# Patient Record
Sex: Female | Born: 1970 | Hispanic: No | Marital: Married
Health system: Midwestern US, Community
[De-identification: ages and names within clinical notes are randomized; demographics above are authoritative.]

## PROBLEM LIST (undated history)

## (undated) DIAGNOSIS — Z9289 Personal history of other medical treatment: Secondary | ICD-10-CM

## (undated) DIAGNOSIS — F319 Bipolar disorder, unspecified: Secondary | ICD-10-CM

## (undated) DIAGNOSIS — J45909 Unspecified asthma, uncomplicated: Secondary | ICD-10-CM

## (undated) DIAGNOSIS — K649 Unspecified hemorrhoids: Secondary | ICD-10-CM

## (undated) DIAGNOSIS — E119 Type 2 diabetes mellitus without complications: Secondary | ICD-10-CM

## (undated) DIAGNOSIS — F209 Schizophrenia, unspecified: Secondary | ICD-10-CM

## (undated) DIAGNOSIS — M1990 Unspecified osteoarthritis, unspecified site: Secondary | ICD-10-CM

## (undated) DIAGNOSIS — F329 Major depressive disorder, single episode, unspecified: Secondary | ICD-10-CM

## (undated) DIAGNOSIS — J40 Bronchitis, not specified as acute or chronic: Secondary | ICD-10-CM

## (undated) DIAGNOSIS — F259 Schizoaffective disorder, unspecified: Secondary | ICD-10-CM

## (undated) DIAGNOSIS — F32A Depression, unspecified: Secondary | ICD-10-CM

## (undated) HISTORY — DX: Unspecified asthma, uncomplicated: J45.909

## (undated) HISTORY — PX: OTHER SURGICAL HISTORY: SHX169

## (undated) HISTORY — PX: COLONOSCOPY: SHX174

---

## 1997-09-08 ENCOUNTER — Inpatient Hospital Stay (HOSPITAL_COMMUNITY): Admission: AD | Admit: 1997-09-08 | Discharge: 1997-09-08 | Payer: Self-pay | Admitting: Obstetrics

## 1997-09-22 ENCOUNTER — Inpatient Hospital Stay (HOSPITAL_COMMUNITY): Admission: AD | Admit: 1997-09-22 | Discharge: 1997-09-22 | Payer: Self-pay | Admitting: Obstetrics

## 1997-09-27 ENCOUNTER — Emergency Department (HOSPITAL_COMMUNITY): Admission: EM | Admit: 1997-09-27 | Discharge: 1997-09-27 | Payer: Self-pay | Admitting: Emergency Medicine

## 1997-12-16 ENCOUNTER — Ambulatory Visit (HOSPITAL_COMMUNITY): Admission: RE | Admit: 1997-12-16 | Discharge: 1997-12-16 | Payer: Self-pay | Admitting: *Deleted

## 1998-01-20 ENCOUNTER — Ambulatory Visit (HOSPITAL_COMMUNITY): Admission: RE | Admit: 1998-01-20 | Discharge: 1998-01-20 | Payer: Self-pay | Admitting: *Deleted

## 1998-03-03 ENCOUNTER — Inpatient Hospital Stay (HOSPITAL_COMMUNITY): Admission: AD | Admit: 1998-03-03 | Discharge: 1998-03-03 | Payer: Self-pay | Admitting: *Deleted

## 1998-03-09 ENCOUNTER — Inpatient Hospital Stay (HOSPITAL_COMMUNITY): Admission: AD | Admit: 1998-03-09 | Discharge: 1998-03-09 | Payer: Self-pay | Admitting: *Deleted

## 1998-04-05 ENCOUNTER — Inpatient Hospital Stay (HOSPITAL_COMMUNITY): Admission: AD | Admit: 1998-04-05 | Discharge: 1998-04-05 | Payer: Self-pay | Admitting: *Deleted

## 1998-04-19 ENCOUNTER — Inpatient Hospital Stay (HOSPITAL_COMMUNITY): Admission: AD | Admit: 1998-04-19 | Discharge: 1998-04-19 | Payer: Self-pay | Admitting: *Deleted

## 1998-05-08 ENCOUNTER — Inpatient Hospital Stay (HOSPITAL_COMMUNITY): Admission: AD | Admit: 1998-05-08 | Discharge: 1998-05-11 | Payer: Self-pay | Admitting: *Deleted

## 1998-05-16 ENCOUNTER — Inpatient Hospital Stay (HOSPITAL_COMMUNITY): Admission: AD | Admit: 1998-05-16 | Discharge: 1998-05-16 | Payer: Self-pay | Admitting: *Deleted

## 1998-06-09 ENCOUNTER — Emergency Department (HOSPITAL_COMMUNITY): Admission: EM | Admit: 1998-06-09 | Discharge: 1998-06-09 | Payer: Self-pay | Admitting: Emergency Medicine

## 1998-09-18 ENCOUNTER — Inpatient Hospital Stay (HOSPITAL_COMMUNITY): Admission: AD | Admit: 1998-09-18 | Discharge: 1998-09-18 | Payer: Self-pay | Admitting: *Deleted

## 1999-02-20 ENCOUNTER — Ambulatory Visit (HOSPITAL_COMMUNITY): Admission: RE | Admit: 1999-02-20 | Discharge: 1999-02-20 | Payer: Self-pay | Admitting: *Deleted

## 1999-10-29 ENCOUNTER — Emergency Department (HOSPITAL_COMMUNITY): Admission: EM | Admit: 1999-10-29 | Discharge: 1999-10-29 | Payer: Self-pay | Admitting: Emergency Medicine

## 1999-11-10 ENCOUNTER — Inpatient Hospital Stay (HOSPITAL_COMMUNITY): Admission: AD | Admit: 1999-11-10 | Discharge: 1999-11-10 | Payer: Self-pay | Admitting: *Deleted

## 2000-05-23 ENCOUNTER — Emergency Department (HOSPITAL_COMMUNITY): Admission: EM | Admit: 2000-05-23 | Discharge: 2000-05-23 | Payer: Self-pay | Admitting: Emergency Medicine

## 2000-05-29 ENCOUNTER — Inpatient Hospital Stay (HOSPITAL_COMMUNITY): Admission: AD | Admit: 2000-05-29 | Discharge: 2000-05-29 | Payer: Self-pay | Admitting: *Deleted

## 2000-07-06 ENCOUNTER — Inpatient Hospital Stay (HOSPITAL_COMMUNITY): Admission: AD | Admit: 2000-07-06 | Discharge: 2000-07-06 | Payer: Self-pay | Admitting: Obstetrics

## 2000-11-13 ENCOUNTER — Emergency Department (HOSPITAL_COMMUNITY): Admission: EM | Admit: 2000-11-13 | Discharge: 2000-11-14 | Payer: Self-pay | Admitting: Emergency Medicine

## 2001-01-25 ENCOUNTER — Inpatient Hospital Stay (HOSPITAL_COMMUNITY): Admission: AD | Admit: 2001-01-25 | Discharge: 2001-01-25 | Payer: Self-pay | Admitting: Obstetrics

## 2001-01-29 ENCOUNTER — Encounter: Payer: Self-pay | Admitting: Obstetrics

## 2001-01-29 ENCOUNTER — Inpatient Hospital Stay (HOSPITAL_COMMUNITY): Admission: AD | Admit: 2001-01-29 | Discharge: 2001-01-29 | Payer: Self-pay | Admitting: Obstetrics

## 2001-02-01 ENCOUNTER — Inpatient Hospital Stay (HOSPITAL_COMMUNITY): Admission: AD | Admit: 2001-02-01 | Discharge: 2001-02-01 | Payer: Self-pay | Admitting: Obstetrics

## 2001-02-14 ENCOUNTER — Encounter: Admission: RE | Admit: 2001-02-14 | Discharge: 2001-02-14 | Payer: Self-pay | Admitting: Obstetrics & Gynecology

## 2001-02-14 ENCOUNTER — Other Ambulatory Visit: Admission: RE | Admit: 2001-02-14 | Discharge: 2001-02-14 | Payer: Self-pay | Admitting: Obstetrics & Gynecology

## 2001-04-12 ENCOUNTER — Inpatient Hospital Stay (HOSPITAL_COMMUNITY): Admission: AD | Admit: 2001-04-12 | Discharge: 2001-04-12 | Payer: Self-pay | Admitting: *Deleted

## 2001-05-11 ENCOUNTER — Inpatient Hospital Stay (HOSPITAL_COMMUNITY): Admission: AD | Admit: 2001-05-11 | Discharge: 2001-05-11 | Payer: Self-pay | Admitting: Obstetrics & Gynecology

## 2001-07-16 ENCOUNTER — Inpatient Hospital Stay (HOSPITAL_COMMUNITY): Admission: AD | Admit: 2001-07-16 | Discharge: 2001-07-16 | Payer: Self-pay | Admitting: Obstetrics and Gynecology

## 2001-07-20 ENCOUNTER — Inpatient Hospital Stay (HOSPITAL_COMMUNITY): Admission: AD | Admit: 2001-07-20 | Discharge: 2001-07-20 | Payer: Self-pay | Admitting: Obstetrics and Gynecology

## 2001-10-31 ENCOUNTER — Emergency Department (HOSPITAL_COMMUNITY): Admission: EM | Admit: 2001-10-31 | Discharge: 2001-11-01 | Payer: Self-pay | Admitting: Emergency Medicine

## 2001-11-19 ENCOUNTER — Inpatient Hospital Stay (HOSPITAL_COMMUNITY): Admission: AD | Admit: 2001-11-19 | Discharge: 2001-11-19 | Payer: Self-pay | Admitting: *Deleted

## 2001-11-27 ENCOUNTER — Inpatient Hospital Stay (HOSPITAL_COMMUNITY): Admission: AD | Admit: 2001-11-27 | Discharge: 2001-11-27 | Payer: Self-pay | Admitting: Family Medicine

## 2001-12-10 ENCOUNTER — Inpatient Hospital Stay (HOSPITAL_COMMUNITY): Admission: AD | Admit: 2001-12-10 | Discharge: 2001-12-10 | Payer: Self-pay | Admitting: Obstetrics and Gynecology

## 2002-01-31 ENCOUNTER — Inpatient Hospital Stay (HOSPITAL_COMMUNITY): Admission: AD | Admit: 2002-01-31 | Discharge: 2002-01-31 | Payer: Self-pay | Admitting: Obstetrics and Gynecology

## 2002-06-03 ENCOUNTER — Emergency Department (HOSPITAL_COMMUNITY): Admission: EM | Admit: 2002-06-03 | Discharge: 2002-06-03 | Payer: Self-pay | Admitting: *Deleted

## 2002-07-03 ENCOUNTER — Ambulatory Visit (HOSPITAL_COMMUNITY): Admission: RE | Admit: 2002-07-03 | Discharge: 2002-07-03 | Payer: Self-pay | Admitting: Internal Medicine

## 2002-07-03 ENCOUNTER — Encounter: Payer: Self-pay | Admitting: Internal Medicine

## 2002-08-27 ENCOUNTER — Encounter: Payer: Self-pay | Admitting: Emergency Medicine

## 2002-08-27 ENCOUNTER — Emergency Department (HOSPITAL_COMMUNITY): Admission: EM | Admit: 2002-08-27 | Discharge: 2002-08-27 | Payer: Self-pay | Admitting: Emergency Medicine

## 2002-09-25 ENCOUNTER — Emergency Department (HOSPITAL_COMMUNITY): Admission: EM | Admit: 2002-09-25 | Discharge: 2002-09-25 | Payer: Self-pay | Admitting: *Deleted

## 2002-10-10 ENCOUNTER — Other Ambulatory Visit: Admission: RE | Admit: 2002-10-10 | Discharge: 2002-10-10 | Payer: Self-pay | Admitting: Obstetrics and Gynecology

## 2002-11-28 ENCOUNTER — Encounter: Payer: Self-pay | Admitting: Obstetrics and Gynecology

## 2002-11-28 ENCOUNTER — Ambulatory Visit (HOSPITAL_COMMUNITY): Admission: RE | Admit: 2002-11-28 | Discharge: 2002-11-28 | Payer: Self-pay | Admitting: Obstetrics and Gynecology

## 2003-04-10 ENCOUNTER — Inpatient Hospital Stay (HOSPITAL_COMMUNITY): Admission: RE | Admit: 2003-04-10 | Discharge: 2003-04-14 | Payer: Self-pay | Admitting: Obstetrics and Gynecology

## 2003-04-10 ENCOUNTER — Encounter (INDEPENDENT_AMBULATORY_CARE_PROVIDER_SITE_OTHER): Payer: Self-pay

## 2003-12-29 ENCOUNTER — Emergency Department (HOSPITAL_COMMUNITY): Admission: EM | Admit: 2003-12-29 | Discharge: 2003-12-29 | Payer: Self-pay | Admitting: Emergency Medicine

## 2004-01-01 ENCOUNTER — Inpatient Hospital Stay (HOSPITAL_COMMUNITY): Admission: AD | Admit: 2004-01-01 | Discharge: 2004-01-01 | Payer: Self-pay | Admitting: Obstetrics and Gynecology

## 2004-03-14 ENCOUNTER — Emergency Department (HOSPITAL_COMMUNITY): Admission: EM | Admit: 2004-03-14 | Discharge: 2004-03-15 | Payer: Self-pay | Admitting: Emergency Medicine

## 2004-04-25 ENCOUNTER — Emergency Department (HOSPITAL_COMMUNITY): Admission: EM | Admit: 2004-04-25 | Discharge: 2004-04-25 | Payer: Self-pay | Admitting: Emergency Medicine

## 2004-10-01 ENCOUNTER — Emergency Department (HOSPITAL_COMMUNITY): Admission: EM | Admit: 2004-10-01 | Discharge: 2004-10-01 | Payer: Self-pay | Admitting: Emergency Medicine

## 2004-11-04 ENCOUNTER — Inpatient Hospital Stay (HOSPITAL_COMMUNITY): Admission: AD | Admit: 2004-11-04 | Discharge: 2004-11-04 | Payer: Self-pay | Admitting: *Deleted

## 2004-11-08 ENCOUNTER — Emergency Department (HOSPITAL_COMMUNITY): Admission: EM | Admit: 2004-11-08 | Discharge: 2004-11-08 | Payer: Self-pay | Admitting: *Deleted

## 2004-11-12 ENCOUNTER — Inpatient Hospital Stay (HOSPITAL_COMMUNITY): Admission: RE | Admit: 2004-11-12 | Discharge: 2004-11-16 | Payer: Self-pay | Admitting: Psychiatry

## 2004-11-12 ENCOUNTER — Ambulatory Visit: Payer: Self-pay | Admitting: Psychiatry

## 2005-05-01 ENCOUNTER — Inpatient Hospital Stay (HOSPITAL_COMMUNITY): Admission: AD | Admit: 2005-05-01 | Discharge: 2005-05-01 | Payer: Self-pay | Admitting: Obstetrics and Gynecology

## 2005-05-30 ENCOUNTER — Inpatient Hospital Stay (HOSPITAL_COMMUNITY): Admission: EM | Admit: 2005-05-30 | Discharge: 2005-06-01 | Payer: Self-pay | Admitting: Emergency Medicine

## 2005-06-14 ENCOUNTER — Emergency Department (HOSPITAL_COMMUNITY): Admission: EM | Admit: 2005-06-14 | Discharge: 2005-06-15 | Payer: Self-pay | Admitting: Emergency Medicine

## 2005-06-14 ENCOUNTER — Ambulatory Visit: Payer: Self-pay | Admitting: Nurse Practitioner

## 2005-06-24 ENCOUNTER — Emergency Department (HOSPITAL_COMMUNITY): Admission: EM | Admit: 2005-06-24 | Discharge: 2005-06-24 | Payer: Self-pay | Admitting: Emergency Medicine

## 2005-08-06 ENCOUNTER — Ambulatory Visit: Payer: Self-pay | Admitting: Internal Medicine

## 2005-08-13 ENCOUNTER — Emergency Department (HOSPITAL_COMMUNITY): Admission: EM | Admit: 2005-08-13 | Discharge: 2005-08-13 | Payer: Self-pay | Admitting: Emergency Medicine

## 2005-08-17 ENCOUNTER — Emergency Department (HOSPITAL_COMMUNITY): Admission: EM | Admit: 2005-08-17 | Discharge: 2005-08-17 | Payer: Self-pay | Admitting: Emergency Medicine

## 2005-10-13 ENCOUNTER — Emergency Department (HOSPITAL_COMMUNITY): Admission: EM | Admit: 2005-10-13 | Discharge: 2005-10-13 | Payer: Self-pay | Admitting: Emergency Medicine

## 2005-10-18 ENCOUNTER — Emergency Department (HOSPITAL_COMMUNITY): Admission: EM | Admit: 2005-10-18 | Discharge: 2005-10-18 | Payer: Self-pay | Admitting: Emergency Medicine

## 2005-11-15 ENCOUNTER — Inpatient Hospital Stay (HOSPITAL_COMMUNITY): Admission: AD | Admit: 2005-11-15 | Discharge: 2005-11-19 | Payer: Self-pay | Admitting: *Deleted

## 2005-11-16 ENCOUNTER — Ambulatory Visit: Payer: Self-pay | Admitting: *Deleted

## 2005-12-02 ENCOUNTER — Emergency Department (HOSPITAL_COMMUNITY): Admission: EM | Admit: 2005-12-02 | Discharge: 2005-12-03 | Payer: Self-pay | Admitting: Emergency Medicine

## 2005-12-03 ENCOUNTER — Inpatient Hospital Stay (HOSPITAL_COMMUNITY): Admission: AD | Admit: 2005-12-03 | Discharge: 2005-12-06 | Payer: Self-pay | Admitting: Psychiatry

## 2005-12-26 ENCOUNTER — Emergency Department (HOSPITAL_COMMUNITY): Admission: EM | Admit: 2005-12-26 | Discharge: 2005-12-26 | Payer: Self-pay | Admitting: Emergency Medicine

## 2006-02-03 ENCOUNTER — Ambulatory Visit: Payer: Self-pay | Admitting: Nurse Practitioner

## 2006-02-04 ENCOUNTER — Ambulatory Visit (HOSPITAL_COMMUNITY): Admission: RE | Admit: 2006-02-04 | Discharge: 2006-02-04 | Payer: Self-pay | Admitting: Nurse Practitioner

## 2006-06-17 ENCOUNTER — Emergency Department (HOSPITAL_COMMUNITY): Admission: EM | Admit: 2006-06-17 | Discharge: 2006-06-18 | Payer: Self-pay | Admitting: Emergency Medicine

## 2006-07-27 ENCOUNTER — Emergency Department (HOSPITAL_COMMUNITY): Admission: EM | Admit: 2006-07-27 | Discharge: 2006-07-27 | Payer: Self-pay | Admitting: Emergency Medicine

## 2006-08-31 ENCOUNTER — Emergency Department (HOSPITAL_COMMUNITY): Admission: EM | Admit: 2006-08-31 | Discharge: 2006-08-31 | Payer: Self-pay | Admitting: Emergency Medicine

## 2006-11-02 ENCOUNTER — Ambulatory Visit: Payer: Self-pay | Admitting: Internal Medicine

## 2006-11-24 ENCOUNTER — Encounter (INDEPENDENT_AMBULATORY_CARE_PROVIDER_SITE_OTHER): Payer: Self-pay | Admitting: Nurse Practitioner

## 2006-11-24 ENCOUNTER — Ambulatory Visit: Payer: Self-pay | Admitting: Internal Medicine

## 2006-12-12 DIAGNOSIS — K649 Unspecified hemorrhoids: Secondary | ICD-10-CM | POA: Insufficient documentation

## 2006-12-12 DIAGNOSIS — R259 Unspecified abnormal involuntary movements: Secondary | ICD-10-CM | POA: Insufficient documentation

## 2006-12-12 DIAGNOSIS — M549 Dorsalgia, unspecified: Secondary | ICD-10-CM | POA: Insufficient documentation

## 2006-12-12 DIAGNOSIS — M542 Cervicalgia: Secondary | ICD-10-CM

## 2006-12-12 DIAGNOSIS — E669 Obesity, unspecified: Secondary | ICD-10-CM

## 2006-12-12 DIAGNOSIS — F250 Schizoaffective disorder, bipolar type: Secondary | ICD-10-CM | POA: Insufficient documentation

## 2006-12-13 DIAGNOSIS — K219 Gastro-esophageal reflux disease without esophagitis: Secondary | ICD-10-CM

## 2006-12-13 DIAGNOSIS — D509 Iron deficiency anemia, unspecified: Secondary | ICD-10-CM

## 2006-12-20 ENCOUNTER — Encounter (INDEPENDENT_AMBULATORY_CARE_PROVIDER_SITE_OTHER): Payer: Self-pay | Admitting: Nurse Practitioner

## 2006-12-20 ENCOUNTER — Ambulatory Visit: Payer: Self-pay | Admitting: Internal Medicine

## 2006-12-20 ENCOUNTER — Other Ambulatory Visit: Admission: RE | Admit: 2006-12-20 | Discharge: 2006-12-20 | Payer: Self-pay | Admitting: Family Medicine

## 2006-12-20 LAB — CONVERTED CEMR LAB
ALT: 9 U/L
AST: 15 U/L
Albumin: 4.4 g/dL
Alkaline Phosphatase: 88 U/L
BUN: 10 mg/dL
Basophils Absolute: 0 K/uL
Basophils Relative: 1 %
CO2: 24 meq/L
Calcium: 9.2 mg/dL
Chloride: 106 meq/L
Creatinine, Ser: 1.07 mg/dL
Eosinophils Absolute: 0.1 K/uL
Eosinophils Relative: 1 %
Glucose, Bld: 69 mg/dL — ABNORMAL LOW
HCT: 32.5 % — ABNORMAL LOW
Hemoglobin: 10.2 g/dL — ABNORMAL LOW
Lymphocytes Relative: 52 % — ABNORMAL HIGH
Lymphs Abs: 4 K/uL — ABNORMAL HIGH
MCHC: 31.4 g/dL
MCV: 83.1 fL
Monocytes Absolute: 0.6 K/uL
Monocytes Relative: 8 %
Neutro Abs: 3.1 K/uL
Neutrophils Relative %: 39 % — ABNORMAL LOW
Platelets: 328 K/uL
Potassium: 4.1 meq/L
RBC: 3.91 M/uL
RDW: 18 % — ABNORMAL HIGH
Sodium: 140 meq/L
TSH: 2.087 u[IU]/mL
Total Bilirubin: 0.3 mg/dL
Total Protein: 7.7 g/dL
WBC: 7.8 10*3/microliter

## 2006-12-21 ENCOUNTER — Encounter (INDEPENDENT_AMBULATORY_CARE_PROVIDER_SITE_OTHER): Payer: Self-pay | Admitting: Nurse Practitioner

## 2007-04-26 ENCOUNTER — Emergency Department (HOSPITAL_COMMUNITY): Admission: EM | Admit: 2007-04-26 | Discharge: 2007-04-27 | Payer: Self-pay | Admitting: Emergency Medicine

## 2007-09-25 ENCOUNTER — Ambulatory Visit: Payer: Self-pay | Admitting: Internal Medicine

## 2007-09-25 LAB — CONVERTED CEMR LAB
ALT: 9 units/L (ref 0–35)
Alkaline Phosphatase: 87 units/L (ref 39–117)
CO2: 22 meq/L (ref 19–32)
Calcium: 9.2 mg/dL (ref 8.4–10.5)
Eosinophils Absolute: 0.1 10*3/uL (ref 0.0–0.7)
Glucose, Bld: 99 mg/dL (ref 70–99)
Lymphocytes Relative: 47 % — ABNORMAL HIGH (ref 12–46)
Lymphs Abs: 3.7 10*3/uL (ref 0.7–4.0)
MCHC: 33 g/dL (ref 30.0–36.0)
MCV: 81.1 fL (ref 78.0–100.0)
Neutrophils Relative %: 46 % (ref 43–77)
RBC: 3.81 M/uL — ABNORMAL LOW (ref 3.87–5.11)
RDW: 17.1 % — ABNORMAL HIGH (ref 11.5–15.5)
Rhuematoid fact SerPl-aCnc: <20 intl units/mL (ref 0–20)
Sodium: 138 meq/L (ref 135–145)
Total Protein: 7.8 g/dL (ref 6.0–8.3)

## 2007-11-07 ENCOUNTER — Emergency Department (HOSPITAL_COMMUNITY): Admission: EM | Admit: 2007-11-07 | Discharge: 2007-11-07 | Payer: Self-pay | Admitting: Emergency Medicine

## 2007-11-24 ENCOUNTER — Emergency Department (HOSPITAL_COMMUNITY): Admission: EM | Admit: 2007-11-24 | Discharge: 2007-11-24 | Payer: Self-pay | Admitting: Emergency Medicine

## 2007-11-30 ENCOUNTER — Inpatient Hospital Stay (HOSPITAL_COMMUNITY): Admission: AD | Admit: 2007-11-30 | Discharge: 2007-12-11 | Payer: Self-pay | Admitting: Psychiatry

## 2007-11-30 ENCOUNTER — Other Ambulatory Visit: Payer: Self-pay | Admitting: Emergency Medicine

## 2007-11-30 ENCOUNTER — Ambulatory Visit: Payer: Self-pay | Admitting: Psychiatry

## 2008-01-19 ENCOUNTER — Ambulatory Visit: Payer: Self-pay | Admitting: Internal Medicine

## 2008-02-02 ENCOUNTER — Ambulatory Visit: Payer: Self-pay | Admitting: Cardiology

## 2008-02-02 LAB — CONVERTED CEMR LAB
Chloride: 105 meq/L (ref 96–112)
GFR calc non Af Amer: 85 mL/min
Glucose, Bld: 92 mg/dL (ref 70–99)
Potassium: 3.8 meq/L (ref 3.5–5.1)

## 2008-02-20 ENCOUNTER — Encounter: Payer: Self-pay | Admitting: Cardiology

## 2008-02-20 ENCOUNTER — Ambulatory Visit: Payer: Self-pay

## 2008-04-29 ENCOUNTER — Emergency Department (HOSPITAL_COMMUNITY): Admission: EM | Admit: 2008-04-29 | Discharge: 2008-04-30 | Payer: Self-pay | Admitting: Emergency Medicine

## 2008-04-30 ENCOUNTER — Emergency Department (HOSPITAL_COMMUNITY): Admission: EM | Admit: 2008-04-30 | Discharge: 2008-04-30 | Payer: Self-pay | Admitting: Emergency Medicine

## 2008-05-11 ENCOUNTER — Emergency Department (HOSPITAL_COMMUNITY): Admission: EM | Admit: 2008-05-11 | Discharge: 2008-05-11 | Payer: Self-pay | Admitting: Emergency Medicine

## 2008-05-20 ENCOUNTER — Emergency Department (HOSPITAL_COMMUNITY): Admission: EM | Admit: 2008-05-20 | Discharge: 2008-05-20 | Payer: Self-pay | Admitting: Emergency Medicine

## 2008-05-26 ENCOUNTER — Emergency Department (HOSPITAL_COMMUNITY): Admission: EM | Admit: 2008-05-26 | Discharge: 2008-05-26 | Payer: Self-pay | Admitting: Family Medicine

## 2008-06-17 ENCOUNTER — Emergency Department (HOSPITAL_COMMUNITY): Admission: EM | Admit: 2008-06-17 | Discharge: 2008-06-17 | Payer: Self-pay | Admitting: Emergency Medicine

## 2008-06-20 ENCOUNTER — Emergency Department (HOSPITAL_COMMUNITY): Admission: EM | Admit: 2008-06-20 | Discharge: 2008-06-20 | Payer: Self-pay | Admitting: Emergency Medicine

## 2008-08-02 IMAGING — CR DG CHEST 2V
2 series · 2 of 2 positions shown · non-contrast
Comparison: 10/13/05.

CLINICAL DATA: Shortness of breath.  Congestion. 
 CHEST ? 2 VIEW:

[view not recorded (1 of 2)]
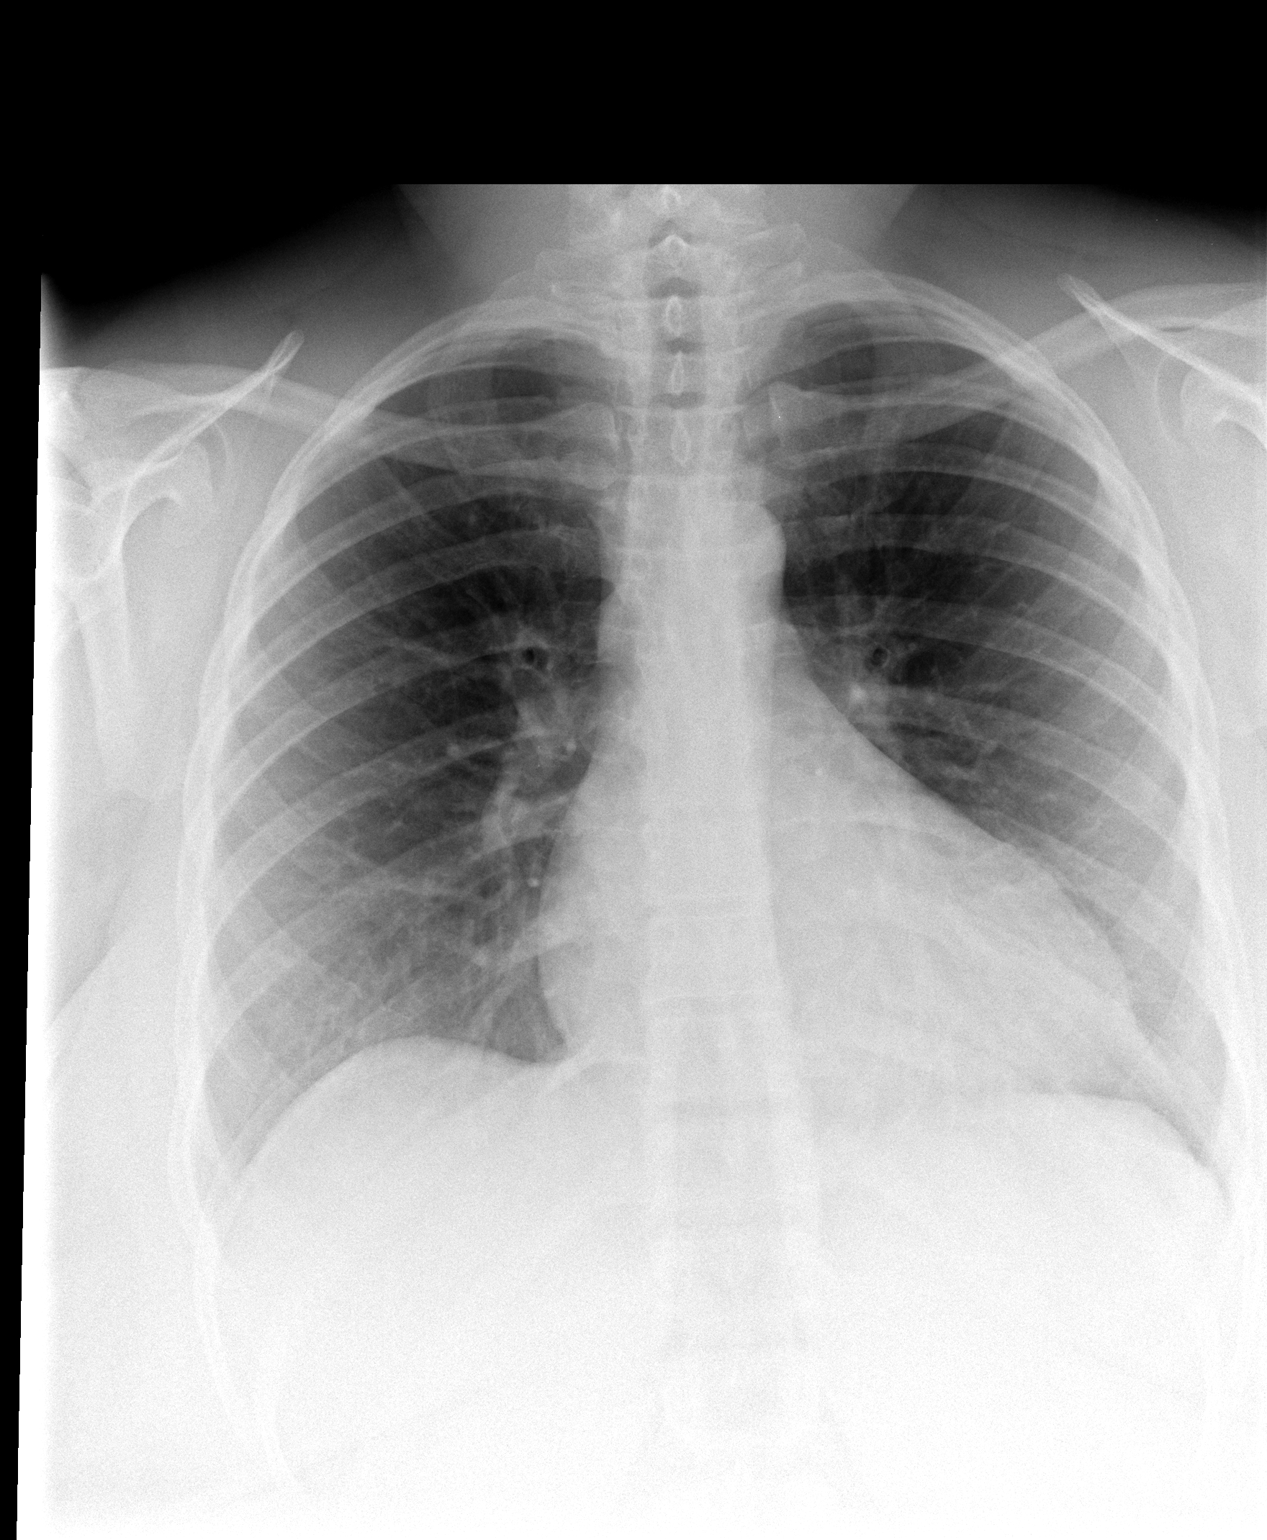

[view not recorded (2 of 2)]
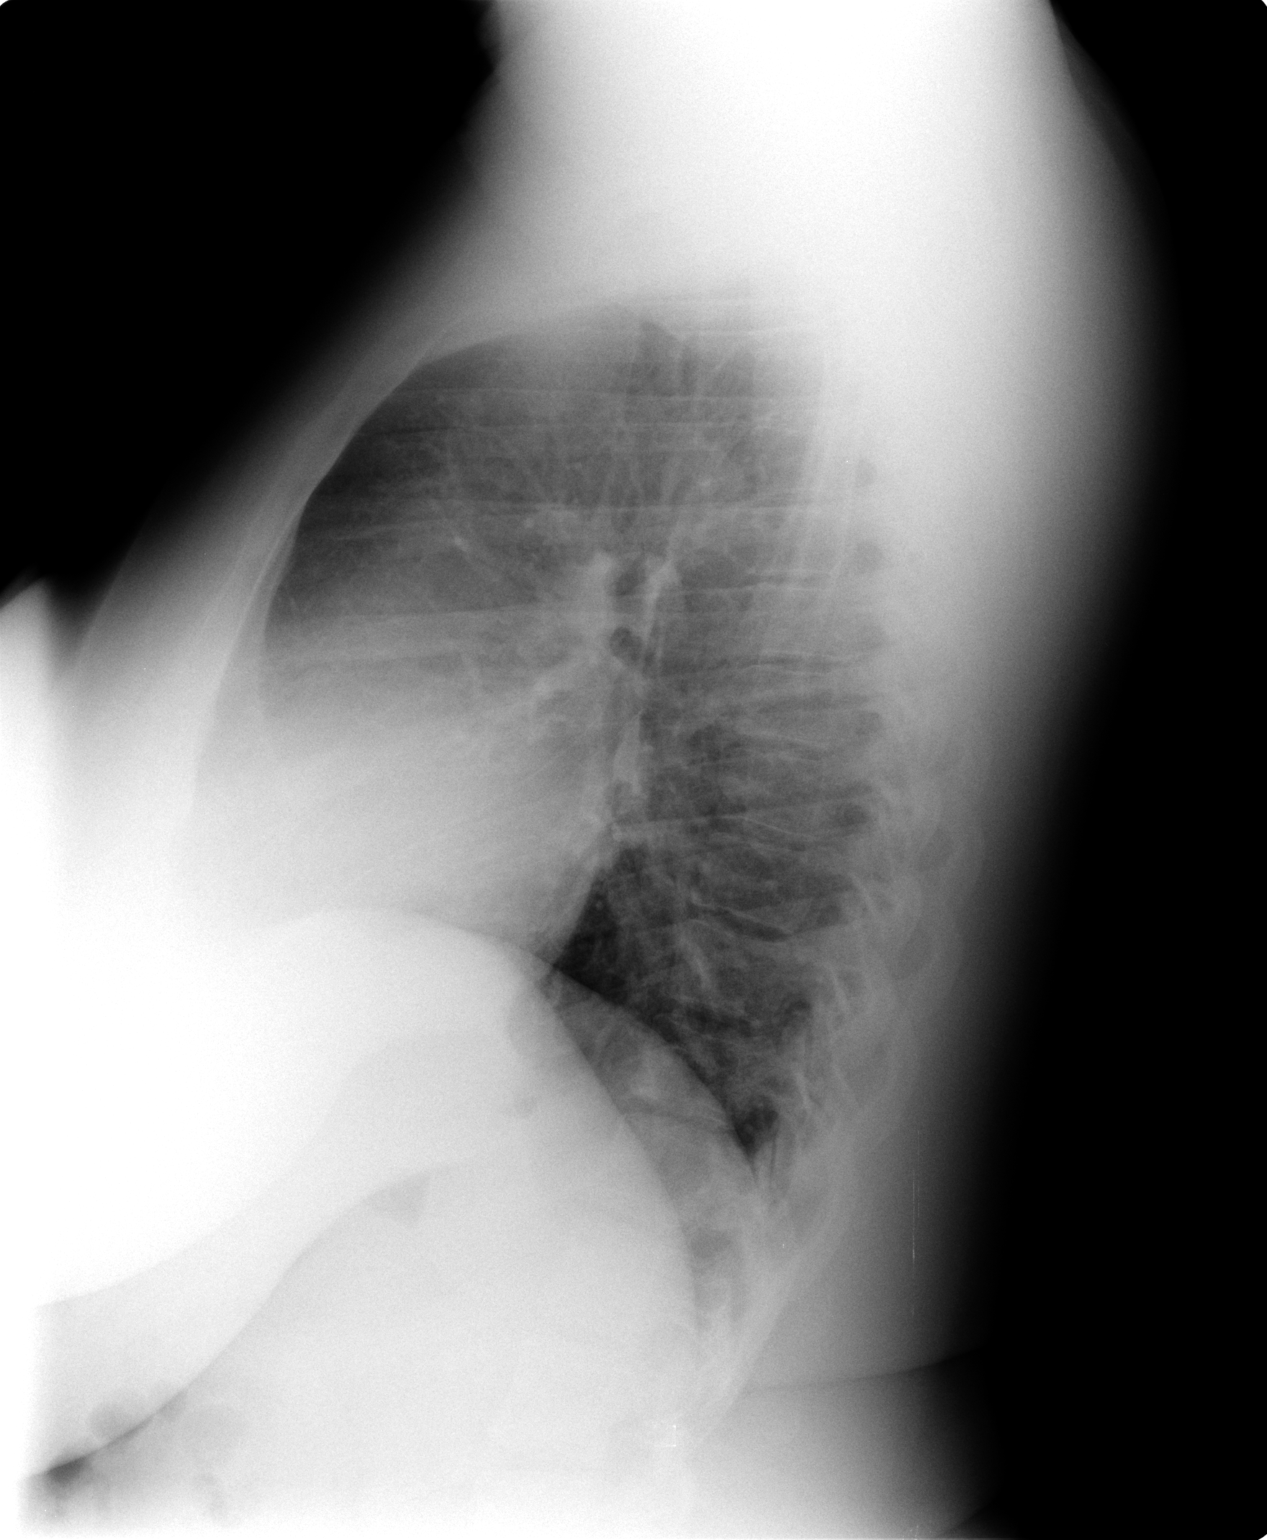

[2 of 2 positions shown; findings below may reference images not displayed]

FINDINGS: The heart size is enlarged.  There is no edema or effusion.  No focal airspace opacities are identified.
IMPRESSION: 1.  No active cardiopulmonary disease. 
 2.  Mild cardiomegaly.

## 2008-10-07 ENCOUNTER — Emergency Department (HOSPITAL_COMMUNITY): Admission: EM | Admit: 2008-10-07 | Discharge: 2008-10-07 | Payer: Self-pay | Admitting: Emergency Medicine

## 2009-01-02 ENCOUNTER — Emergency Department (HOSPITAL_COMMUNITY): Admission: EM | Admit: 2009-01-02 | Discharge: 2009-01-02 | Payer: Self-pay | Admitting: Family Medicine

## 2009-03-09 ENCOUNTER — Other Ambulatory Visit: Payer: Self-pay

## 2009-03-09 ENCOUNTER — Other Ambulatory Visit: Payer: Self-pay | Admitting: Emergency Medicine

## 2009-03-10 ENCOUNTER — Ambulatory Visit: Payer: Self-pay | Admitting: Psychiatry

## 2009-03-10 ENCOUNTER — Inpatient Hospital Stay (HOSPITAL_COMMUNITY): Admission: AD | Admit: 2009-03-10 | Discharge: 2009-03-21 | Payer: Self-pay | Admitting: Psychiatry

## 2009-11-19 ENCOUNTER — Emergency Department (HOSPITAL_COMMUNITY): Admission: EM | Admit: 2009-11-19 | Discharge: 2009-11-19 | Payer: Self-pay | Admitting: Emergency Medicine

## 2010-07-22 LAB — RAPID URINE DRUG SCREEN, HOSP PERFORMED
Amphetamines: NOT DETECTED
Cocaine: NOT DETECTED
Opiates: NOT DETECTED
Tetrahydrocannabinol: NOT DETECTED

## 2010-07-22 LAB — URINALYSIS, ROUTINE W REFLEX MICROSCOPIC
Glucose, UA: NEGATIVE mg/dL
Ketones, ur: >80 mg/dL — AB
Nitrite: POSITIVE — AB
Protein, ur: 30 mg/dL — AB
Urobilinogen, UA: 0.2 mg/dL (ref 0.0–1.0)
pH: 6 (ref 5.0–8.0)

## 2010-07-22 LAB — DIFFERENTIAL
Basophils Absolute: 0.1 10*3/uL (ref 0.0–0.1)
Basophils Relative: 1 % (ref 0–1)
Neutrophils Relative %: 68 % (ref 43–77)

## 2010-07-22 LAB — BASIC METABOLIC PANEL
BUN: 6 mg/dL (ref 6–23)
Creatinine, Ser: 0.86 mg/dL (ref 0.4–1.2)

## 2010-07-22 LAB — CBC
HCT: 31 % — ABNORMAL LOW (ref 36.0–46.0)
RDW: 20 % — ABNORMAL HIGH (ref 11.5–15.5)

## 2010-07-22 LAB — URINE CULTURE

## 2010-07-22 LAB — URINE MICROSCOPIC-ADD ON

## 2010-07-22 LAB — ETHANOL: Alcohol, Ethyl (B): <5 mg/dL (ref 0–10)

## 2010-07-22 LAB — POCT PREGNANCY, URINE: Preg Test, Ur: NEGATIVE

## 2010-07-30 LAB — POCT I-STAT, CHEM 8
Glucose, Bld: 99 mg/dL (ref 70–99)
HCT: 38 % (ref 36.0–46.0)
Hemoglobin: 12.9 g/dL (ref 12.0–15.0)
Sodium: 139 mEq/L (ref 135–145)
TCO2: 27 mmol/L (ref 0–100)

## 2010-08-03 LAB — URINALYSIS, ROUTINE W REFLEX MICROSCOPIC
Ketones, ur: NEGATIVE mg/dL
Protein, ur: NEGATIVE mg/dL
pH: 6 (ref 5.0–8.0)

## 2010-08-03 LAB — POCT I-STAT, CHEM 8
Calcium, Ion: 1.21 mmol/L (ref 1.12–1.32)
Glucose, Bld: 86 mg/dL (ref 70–99)
HCT: 34 % — ABNORMAL LOW (ref 36.0–46.0)
Hemoglobin: 11.6 g/dL — ABNORMAL LOW (ref 12.0–15.0)
Potassium: 3.9 mEq/L (ref 3.5–5.1)

## 2010-08-03 LAB — URINE MICROSCOPIC-ADD ON
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
## 2010-09-01 NOTE — Assessment & Plan Note (Signed)
[facility]                            CARDIOLOGY OFFICE NOTE   [name], [name]                          MRN:          [id number]  DATE:02/02/2008                            DOB:          1970/12/09    PRIMARY:  Dineen Kid. Reche Dixon, MD   REASON FOR CONSULTATION:  Evaluate the patient with cardiomegaly.   HISTORY OF PRESENT ILLNESS:  The patient is a pleasant 40 year old  African American female with no prior cardiac history.  She did have an  ER visit in 2008 apparently with some chest pain.  She was told that  this was GI.  There might have been a chest x-ray at that point with  cardiomegaly.  I do not have this report.  She was seen at [facility]  and was describing some heart fluttering and with these 2 problems she  has referred for consultation.   The patient says she notices her heart skipping at night when she lays  down.  She describes palpitations.  These seem to be isolated.  They  occur quickly and go away quickly.  She does not describe any tachy  palpitations.  She gets a little feeling like she has to catch her  breath at moment.  She is not describing PND or orthopnea.  She is not  describing presyncope or syncope.  She does not have chest pressure,  neck or arm discomfort.  She does not really notice them during the day  when she is active.  She cannot bring them on.  She does not associate  them with foods.  She does drink quite a bit of caffeine which has been  new.   She is not overly active though she has to do lot of walking to do her  daily chores.  With this, she denies any chest pressure, neck or arm  discomfort.  She has no significant shortness of breath with this.   PAST MEDICAL HISTORY:  Schizoaffective disorder.  She has known history  of hypertension, diabetes, or hyperlipidemia.   PAST SURGICAL HISTORY:  Tubal ligation.   ALLERGIES:  None.   MEDICATIONS:  Abilify 15 mg daily, Cogentin.   SOCIAL HISTORY:  The  patient is drinking 3 cups of coffee a day.  She  drinks caffeinated soft drinks as well.  She is disabled.  She is  separated.  She has three children.  She smokes five cigarettes per day  for 10 years.   FAMILY HISTORY:  Noncontributory for early coronary disease though her  father died at age 41 of cancer.  There is no history of syncope or  sudden death.   REVIEW OF SYSTEMS:  As stated in the HPI, positive for some joint pains.  Negative for all other systems except for benign tremor.   PHYSICAL EXAMINATION:  GENERAL:  The patient is a very pleasant and in  no distress.  VITAL SIGNS:  Blood pressure 122/76, heart rate 77 and regular, weight  239 pounds.  HEENT:  Eyes unremarkable, pupils equal, round, and reactive to light,  fundi not visualized,  oral mucosa unremarkable.  NECK:  No jugular venous distention at 45 degrees, carotid upstroke  brisk and symmetric, no bruits, no thyromegaly.  LYMPHATICS:  No cervical, axillary, or inguinal lymphadenopathy.  LUNGS:  Clear to auscultation bilaterally.  BACK:  No costovertebral angle tenderness.  CHEST:  Unremarkable.  HEART:  PMI not displaced or sustained, S1 and S2 within normal limits,  no S3, no S4, no clicks, no rubs, no murmurs.  ABDOMEN:  Obese, positive bowel sounds normal in frequency and pitch, no  bruits, no rebound, no guarding or midline pulsatile mass.  No  hepatomegaly, no splenomegaly.  SKIN:  No rashes, no nodules.  EXTREMITIES:  2+ pulses throughout, no edema, no cyanosis or clubbing.  NEURO:  She has a fine resting tremor less noticeable in her head,  cranial nerves II through XII grossly intact, motor grossly intact.   EKG sinus rhythm, rate 68, axis within normals limits, intervals within  normal limits, no acute ST-wave changes.   ASSESSMENT AND PLAN:  1. Palpitations.  The patient is describing palpitations sound like      premature atrial and ventricular contractions.  She has this vague      history of  cardiomegaly apparently from a chest x-ray.  I am going      to check a TSH today, though this apparently has been normal in the      past.  We will check a magnesium and BMET.  She will also get an      echocardiogram.  If all of these are normal and her palpitations      are no more symptomatic than described and I would suggest no      further cardiac workup.  Rather I would suggest that she quit the      caffeine.  However, if she quits caffeine and has more palpitations      or starts noticing them at other times of the day with least put on      a monitor to try to diagnose this.  2. Cardiomegaly as above.  3. Obesity.  She understands need to lose weight with diet and      exercise.  4. Schizoaffective disorder.  This is followed by her primary care      doctor and apparently a psychiatrist.  5. Followup.  I will see her back based on the results of the above or      further symptoms.     Rollene Rotunda, MD, [facility]  Electronically Signed    JH/MedQ  DD: 02/02/2008  DT: 02/02/2008  Job #: [id number]   cc:   Dineen Kid. Reche Dixon, M.D.

## 2010-09-01 NOTE — Discharge Summary (Signed)
[name], [name] NO.:  [id number]   MEDICAL RECORD NO.:  [id number]          PATIENT TYPE:  IPS   LOCATION:  0407                          [facility]ACILITY[facility]  [facility]   PHYSICIAN:  Anselm Jungling, MD  DATE OF BIRTH:  1970-07-20   DATE OF ADMISSION:  11/30/2007  DATE OF DISCHARGE:  12/11/2007                               DISCHARGE SUMMARY   IDENTIFYING DATA/REASON FOR ADMISSION:  The patient is a 40 year old  unmarried African American female who was involuntarily committed due to  increasing hallucinations and bizarre behaviors.  She came to [place] with a  history of severe mental illness, and apparently had been off of her  usual regimen of Risperdal.  Please refer to the admission note for  further details pertaining to the symptoms, circumstances and history  that led to her hospitalization.  She was given an initial Axis I  diagnosis of schizoaffective disorder, acute exacerbation.   MEDICAL/LABORATORY:  The patient has a history of hypertension.  She was  continued on her usual regimen of hydrochlorothiazide.  She was also  treated with Flagyl 500 mg b.i.d. for presumed infection.  She was  medically and physically assessed by the psychiatric nurse practitioner.  Aside from this, there were no acute medical issues.   HOSPITAL COURSE:  The patient was admitted to the adult inpatient  psychiatric service.  She presented as a well-nourished, normally-  developed adult female with prominent thought blocking and latency.  She  recognized that she was in a psychiatric facility for evaluation and  treatment, but was not able to give much coherent history.   She was restarted on a regimen of Risperdal and Cogentin.  She was  initially resistant to taking this and refused many doses over the first  2-3 days.  Eventually, she began to take medication more consistently,  and then began to show improvement in stabilization and her thought  disorder, with more spontaneous  verbalizations and more affect.   We encouraged her to allow [place] to contact her mother for more collateral  information and for discharge planning.  With quite a bit of reluctance,  the patient finally agreed to this.   The patient was involved in various therapeutic groups and activities,  and was an inconsistent participant, but her participation improved  towards the end of her hospital stay.   At the time of discharge, she was denying auditory hallucinations.  Her  thoughts appeared to be well-organized and her level of insight was  good.  She agreed to continue medication on an outpatient basis and  agreed to the following aftercare plan.   AFTERCARE:  The patient was to follow-up at the [facility] with an  appointment with Dr. Josem Kaufmann on December 13, 2007.   DISCHARGE MEDICATIONS:  1. Risperdal 4 mg q.h.s.  2. Cogentin 1 mg q.h.s..   DISCHARGE DIAGNOSES:  AXIS I: Schizoaffective disorder, NOS.  AXIS II:  Deferred.  AXIS III:  History of hypertension.  AXIS IV:  Stressors severe.  AXIS V:  GAF on discharge 55.  Anselm Jungling, MD  Electronically Signed     SPB/MEDQ  D:  12/11/2007  T:  12/11/2007  Job:  [id number]

## 2010-09-04 NOTE — Discharge Summary (Signed)
[name], [name]                 ACCOUNT NO.:  [id number]   MEDICAL RECORD NO.:  [id number]          PATIENT TYPE:  INP   LOCATION:  5010                         FACILITY:  [facility]   PHYSICIAN:  Jonna L. Robb Matar, M.D.DATE OF BIRTH:  March 09, 1971   DATE OF ADMISSION:  05/30/2005  DATE OF DISCHARGE:  06/01/2005                                 DISCHARGE SUMMARY   PRIMARY CARE PHYSICIAN:  [facility].   GASTROENTEROLOGISTS:  Dr. Loreta Ave and Dr. Elnoria Howard.   FINAL DIAGNOSES:  1.  Anemia of chronic blood loss.  2.  Bleeding internal hemorrhoids.  3.  Schizoaffective disorder.  4.  Morbid obesity.  5.  Gastroesophageal reflux disease.  6.  Tobacco use.   HISTORY:  This is a 40 year old, African-American female with a 2-week  history of bright red blood per rectum plus fairly heavy menstrual cycle.  She notes that she was getting a lot of the bleeding especially when stools  were hard.  Physical exam was remarkable for heme positive stool, morbid  obesity, perhaps some very slight cogwheeling.  Initial laboratory work  showed a hemoglobin of 8.3 but was otherwise unremarkable.   HOSPITAL COURSE:  The patient was transfused 2 units and brought her up to a  hemoglobin of 10.3.  She was seen in consultation by Dr. Loreta Ave and underwent  a colonoscopy on June 01, 2005 by Dr. Elnoria Howard which showed external  hemorrhoids, and it was overall felt that the hemorrhoids were responsible  for the bleeding.   DISPOSITION:  The patient will be discharged on a high-fiber diet.  She will  be on Nu-Iron 150 daily, and has been told to eat red meat to build up her  iron.  She will also be on Cogentin 0.5 b.i.d.; Trilafon 4 mg at bedtime;  Wellbutrin 150 at bedtime; Seroquel 100 mg at bedtime; Colace 100 once or  twice daily over the counter to keep stools soft.  She should be seen at  [facility] in about 2-3 weeks to get a repeat CBC to make sure that her  hemoglobin is staying up.      Jonna L.  Robb Matar, M.D.  Electronically Signed     JLB/MEDQ  D:  06/01/2005  T:  06/02/2005  Job:  [id number]   cc:   [facility]   Anselmo Rod, M.D.  Fax: [phone]   Jordan Hawks. Elnoria Howard, MD  Fax: [phone]
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
## 2010-09-04 NOTE — Op Note (Signed)
NAME:  OASIS, GOEHRING                        ACCOUNT NO.:  [id number]   MEDICAL RECORD NO.:  [id number]                   PATIENT TYPE:  INP   LOCATION:  9198                                 FACILITY:  [facility]   PHYSICIAN:  Malachi Pro. Ambrose Mantle, M.D.              DATE OF BIRTH:  1970-06-22   DATE OF PROCEDURE:  04/10/2003  DATE OF DISCHARGE:                                 OPERATIVE REPORT   PREOPERATIVE DIAGNOSES:  1. Intrauterine pregnancy at 38+ weeks, three prior cesarean sections.  2. Voluntary sterilization.  3. Schizoaffective disorder.   POSTOPERATIVE DIAGNOSES:  1. Intrauterine pregnancy at 38+ weeks, three prior cesarean sections.  2. Voluntary sterilization.  3. Schizoaffective disorder.   OPERATION:  1. Low transverse cervical cesarean section.  2. Bilateral tubal ligation.   OPERATOR:  Malachi Pro. Ambrose Mantle, M.D.   ASSISTANT:  Huel Cote, M.D.   Spinal anesthesia.   A female infant, 6 pounds 4 ounces, Apgars of 9 at one and 9 at five minutes.   The patient was brought to the operating room after monitoring the baby  during the antenatal period and also in the operating room during her spinal  anesthetic.  The tracing looked fine.  The patient was given a spinal  anesthetic by Octaviano Glow. Pamalee Leyden, M.D., placed in the left lateral tilt  position.  The abdomen was prepped with Betadine solution.  The urethra was  prepped, and a Foley catheter was inserted to straight drain.  The abdomen  was then draped as a sterile field.  The patient had had one keloid removed  from her lower abdomen so she had two remaining scars, but they were both  right in the crease so I chose to make an incision about an inch superior to  the previous two incisions.  I carried it in layers through the skin,  subcutaneous tissue, and fascia.  The fascia was then separated from the  rectus muscle.  In spite of fairly dense scarring superiorly and inferiorly,  peritoneum was entered and an opening  was made into the peritoneum.  There  was somewhat limited visibility because of her large panniculus.  I exposed  the lower uterine segment and exposed the bladder and saw that the lower  uterine segment had almost no muscle.  I could basically see through the  thin layer covering the baby.  The bladder was well inferior to this.  I  just made an incision into this area.  Clear fluid emerged.  I separated the  incision by pulling with blunt dissection superiorly and inferiorly.  The  infant's head was delivered into the incisional opening but I had trouble  delivering the head, so I placed a vacuum on the head and it easily pulled  through the incisional opening.  The nose and pharynx were suctioned  multiple times with the bulb because there was a lot of clear fluid present.  I  then delivered the remainder of the baby.  The cord was clamped.  The  infant was given to Fayrene Fearing L. Alison Murray, M.D., who was in attendance.  He  assigned the 6 pound 4 ounce female infant Apgars of 9 at one and 9 at five  minutes.  The placenta was removed intact.  The cervix was dilated with a  ring forceps.  The uterine incision, since she was going to have a tubal  ligation, was closed in one layer.  I actually had to use four extra figure-  of-eight sutures for complete hemostasis.  I identified both tubes and  ovaries.  They both were normal.  The uterus was normal.  I made a window in  the mesosalpinx of each tube, placed two ties of 0 plain catgut proximally  and distally on each tube, cut out the intervening segment of tube.  There  was no bleeding.  Reinspection of the uterine inspection showed no bleeding.  Liberal irrigation confirmed hemostasis.  The gutters were blotted free of  blood, and the rectus muscle was reapproximated with interrupted sutures of  0 Vicryl.  The fascia was closed with two running sutures of 0 Vicryl, subcu  with a running 3-0 Vicryl, and the skin was closed with automatic staples.   The patient seemed to tolerate the procedure well.  Blood loss was estimated  at 600 mL.  Sponge and needle counts were correct, and she was returned to  recovery in satisfactory condition.                                               Malachi Pro. Ambrose Mantle, M.D.    TFH/MEDQ  D:  04/10/2003  T:  04/10/2003  Job:  [id number]

## 2010-09-04 NOTE — Discharge Summary (Signed)
[name], [name] NO.:  [id number]   MEDICAL RECORD NO.:  [id number]          PATIENT TYPE:  IPS   LOCATION:  0400                          [facility]   PHYSICIAN:  Jasmine Pang, M.D. DATE OF BIRTH:  1970/12/11   DATE OF ADMISSION:  12/03/2005  DATE OF DISCHARGE:  12/06/2005                                 DISCHARGE SUMMARY   IDENTIFICATION:  The patient was a 40 year old single African-American  female who was admitted on a voluntary basis on November 15, 2005.   HISTORY OF PRESENT ILLNESS:  The patient has had a history of increased  stress, physical pains, headaches, stomach pain, depression and crying  episodes.  She states she is stressed at home.  She is a single mother with  3 children.  She is not coping well.  She is fatigued and states she has  lost 30 pounds.  She had suicidal ideation but no plan.  The patient was  here in July 2006 for psychosis.  She is seen at the [facility].  No prior suicide attempt.  For further psychiatric admission  information see psychiatric admission assessment.   POSITIVE PHYSICAL FINDINGS:  No acute physical distress.   ADMISSION LABORATORIES:  Glucose 101, urine drug screen negative, urinalysis  negative, urine pregnancy test negative, alcohol less than 5.  The patient  was assessed at the [facility] Emergency Department and further information  from labs were done there and reviewed by their physician.   HOSPITAL COURSE:  Upon admission the patient was started on Ambien 10 mg  p.o. q.h.s. p.r.n.  She was also started on Invega 6 mg p.o. daily to begin  in the a.m. and Risperdal M-Tab 1 mg now.  She was started on Ativan 2 mg  p.o. or IM t.i.d. p.r.n. anxiety.  On November 16, 2005 the patient was started  on iron sulfate 325 mg 1 p.o. b.i.d.  Hinda Glatter was discontinued.  She was  begun on Risperdal 0.25 mg p.o. q.a.m. and 0.5 mg q.h.s.  Colace was started  at 100 mg p.o. b.i.d.  On  November 17, 2005 she was given 2 mg Nicorette gum  q.4h p.r.n. nicotine withdrawal.  On November 18, 2005 the patient was started  on Risperdal M-Tab 1 mg p.o. and was given a Risperdal M-Tab 1 mg p.o. now  and Ativan 2 mg p.o. now.  The patient tolerated these medications well with  no significant side effects.   Upon first meeting the patient she stated she was here because her thinking  had gotten confused.  She was thinking people were saying something else  than they were.  She started to get angry about it.  She states she started  to obsess over her health.  She kept feeling like she was going to die.  She  also got paranoid about her medications.  On November 18, 2005 I discussed with  the patient that in order to stay on the 300-500 hallway she needs to stay  up, otherwise she needs to go to  the 400 intensive care unit.  She was upset  about this and tried to say everyone stays in bed.  She was very resistant  to interacting with others.   On November 19, 2005 the patient was no longer paranoid about what people were  saying.  Her mental status had improved markedly from admission.  Her mood  was less depressed and anxious, affect wider range.  No suicidal or  homicidal ideation, no auditory or visual hallucinations, no paranoia or  delusions.  Thoughts logical and goal directed.  Cognitive grossly within  normal limits.  The patient was excited to get home to be with her children.   DISCHARGE DIAGNOSES:  AXIS I:  Schizoaffective disorder, depressed phase.  AXIS II:  None.  AXIS III:  Back pain, anemia, tobacco abuse.  AXIS IV:  Severe (single mom, other psychosocial problems, medical  problems).  AXIS V.  Global assessment of functioning on discharge was 45, global  assessment of functioning on admission was 35, global assessment of  functioning highest past year was 60.   DISCHARGE PLANS:  The patient had no specific activity level or dietary  restrictions.   DISCHARGE  MEDICATIONS:  1. Zoloft 50 mg daily.  2. Risperdal 0.5 mg 1/2 pill twice daily and 1 pill at bedtime.  3. Ambien 10 mg at bedtime.   POST HOSPITAL CARE PLAN:  The patient will be seen at [facility] by Dr. Lang Snow.  Appointment was scheduled for this.      Jasmine Pang, M.D.  Electronically Signed     BHS/MEDQ  D:  12/09/2005  T:  12/09/2005  Job:  [id number]

## 2010-09-04 NOTE — Discharge Summary (Signed)
NAMEJALEISA, BROSE NO.:  [id number]   MEDICAL RECORD NO.:  [id number]          PATIENT TYPE:  IPS   LOCATION:  0508                          [facility]ILITY[facility]  [facility]   PHYSICIAN:  Jasmine Pang, M.D. DATE OF BIRTH:  1971-01-12   DATE OF ADMISSION:  11/15/2005  DATE OF DISCHARGE:  11/19/2005                                 DISCHARGE SUMMARY   IDENTIFICATION:  The patient is a 40 year old single African-American female  who was admitted on a voluntary basis on November 15, 2005.   HISTORY OF PRESENT ILLNESS:  The patient reports a history of increased  stress, physical pain, headaches, stomach pain, depression, crying and  stress at home.  She is the single mother of three children.  She is not  sleeping well.  She is fatigued and tired with positive auditory  hallucinations.  She has lost 30 pounds recently.  She has had suicidal  ideation.   For further admit information, see the psychiatric admission assessment.   PHYSICAL EXAMINATION:  The patient was assessed at [facility] ED.  She was  in no acute distress though there was a resting tremor.   LABORATORY DATA:  The patient's glucose was slightly high at 101 (70-99).  UDS was negative.  Urinalysis was negative.  Alcohol was less than 5.  Urine  pregnancy test negative.  The rest of the admission labs were done in the ED  and evaluated by the ED physician.   HOSPITAL COURSE:  Upon admission, the patient had not been on any  medications consistently.  She was started on Invega 6 mg p.o. q.d. in the  morning and Risperdal M-Tab 1 mg now.  She was given Ativan 2 mg p.o. or IM  t.i.d. p.r.n. anxiety.  On November 16, 2005, the patient was started on iron  sulfate 325 mg, 1 p.o. b.i.d. due to decreased hematocrit and hemoglobin.  The Invega was discontinued.  She was placed on Risperdal 0.25 mg p.o.  q.a.m. and 0.5 mg p.o. q.h.s.  She was placed on Colace 100 mg p.o. b.i.d.  p.r.n. constipation.  On November 17, 2005, the  patient was started on 2 mg  Nicorette gum q.4h. p.r.n. nicotine withdrawal.  On November 18, 2005, the  patient was given a one-time dose of Risperdal M-Tab 1 mg p.o. and Ativan 2  mg p.o. now for agitation.  The patient tolerated these medications well  with no significant side effects.   Upon meeting the patient, the patient stated she was here because her  thinking had gotten confused.  She was thinking people were saying something  else and they were.  She had started to get angry about it.  She had started  to obsess over her health.  She kept feeling like she was going to die.  She  had also gotten paranoid about her medications.  On November 18, 2005, I  discussed with patient that, in order to stay on the 500 Hallway, she had to  stay out of bed.  Up until then, she had been sleeping constantly.  Otherwise,  she needed to go to the 400 Intensive Care Unit hall.  She was  upset about this and tried to say everyone stays in bed.  She was very  resistant to interacting with others.   On November 19, 2005, the patient stated she felt ready to go home.  She stated  she was no longer paranoid about what people say.  Her mental status had  improved.  She had good eye contact.  Speech normal rate and flow.  Psychomotor activity within normal limits.  Mood was less depressed and  anxious.  Affect wider range.  No suicidal ideation.  No homicidal ideation.  No self-injurious behavior.  No auditory or visual hallucinations.  No  paranoia or delusions.  Thoughts were logical and goal directed.  Cognitive  was grossly within normal limits.  The patient was excited to get home to  her children and felt ready to go home.   DISCHARGE DIAGNOSES:  AXIS I:  Schizoaffective disorder, depressed mood.  AXIS II:  None.  AXIS III:  Back pain, anemia, tobacco abuse.  AXIS IV:  Severe (problems with primary support group, economic problem,  medical problems, other psychosocial problems).  AXIS V:  GAF upon  discharge 50; GAF upon admission 35; GAF highest past year  65.   ACTIVITY/DIET:  There were no specific activity level or dietary  restrictions.   DISCHARGE MEDICATIONS:  1. Zoloft 50 mg q.d.  2. Risperdal 0.5 mg, 1/2 pill twice daily and 1 pill at bedtime.  3. Ambien 10 mg p.o. q.h.s.   POST-HOSPITAL CARE PLANS:  The patient was scheduled to see Dr. Lang Snow at  the [facility].  An appointment was set up for  her.      Jasmine Pang, M.D.  Electronically Signed     BHS/MEDQ  D:  12/16/2005  T:  12/16/2005  Job:  [id number]

## 2010-09-04 NOTE — Discharge Summary (Signed)
NAME:  [name], [name]                        ACCOUNT NO.:  [id number]   MEDICAL RECORD NO.:  [id number]                   PATIENT TYPE:  INP   LOCATION:  9127                                 [facility]ACILITY:  [facility]   PHYSICIAN:  Malachi Pro. Ambrose Mantle, M.D.              DATE OF BIRTH:  January 30, 1971   DATE OF ADMISSION:  04/10/2003  DATE OF DISCHARGE:  04/14/2003                                 DISCHARGE SUMMARY   HOSPITAL COURSE:  This is a 40 year old black female para 1-2-0-2, gravida 4  with EDC April 20, 2003. Had three prior cesarean sections and also wanted  sterilization.  Blood group and type O positive with a negative antibody,  sickle cell negative, RPR nonreactive, rubella immune, hepatitis B surface  antigen negative, HIV negative.  GC and Chlamydia negative.  One-hour  Glucola 68.  Group B strep positive in the urine.  The patient underwent a  low transverse cervical C-section and tubal ligation with Dr. Senaida Ores  assisting under spinal anesthesia with delivery of a female infant six pounds  4 ounces.  Apgars of 9 at one and 9 at five minutes.  Postpartum, the  patient well.  She did recover somewhat slowing, claiming that she was very  sore in her pelvic bone and hips and making it difficult to walk although  she is ambulating well and on the fourth postpartum day was ready for  discharge.  Her staples were removed, Steri-Strips were applied, her  incision was healing well.  She was voiding well without difficulty, having  normal bowel action.  She was seen in consultation by the master of social  work who felt that there was no barrier to discharge and she will be  applying for [facility] and food stamps.  Hemoglobin on admission 10.6, hematocrit  32.3, white count 10,600, platelet count 241,000.  Followup hemoglobin 8.5.  Comprehensive metabolic profile was normal except for the usual values that  run abnormal during pregnancy.  Urinalysis did show 30 mg percent of  protein.  Otherwise was  normal.  The patient was O positive with a negative  antibody screen.  RPR nonreactive.   FINAL DIAGNOSES:  Intrauterine pregnancy 38 plus weeks, three prior cesarean  sections, voluntary sterilization, schizoaffective disorder.   OPERATION:  Low transverse cervical cesarean section, bilateral tubal  ligation.   FINAL CONDITION:  Improved.   Instructions include our regular discharge instruction booklet.  The patient  is advised to avoid heavy lifting or strenuous activity.  Call with any  fever above 100.4 degrees.  Call with any heavy bleeding.  Return to the  office in 10 to 14 days for follow up examination.  Percocet 5/325 mg,  (dispense #20 tablets) one every four to six hours is given at discharge.  Malachi Pro. Ambrose Mantle, M.D.    TFH/MEDQ  D:  04/14/2003  T:  04/14/2003  Job:  [id number]
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
## 2010-09-04 NOTE — H&P (Signed)
NAME:  [name], [name]                        ACCOUNT NO.:  [id number]   MEDICAL RECORD NO.:  [id number]                   PATIENT TYPE:  INP   LOCATION:  NA                                   FACILITY:  [facility]   PHYSICIAN:  Malachi Pro. Ambrose Mantle, M.D.              DATE OF BIRTH:  December 08, 1970   DATE OF ADMISSION:  04/10/2003  DATE OF DISCHARGE:                                HISTORY & PHYSICAL   HISTORY OF PRESENT ILLNESS:  This is a 40 year old black female para 1, 2,  0, gravida 4, with EDC April 20, 2003, admitted for repeat  C-section after  having 3 prior C-sections. Blood group and type O positive, negative  antibody, negative sickle cell, RPR nonreactive, rubella immune, hepatitis B  surface antigen negative, HIV negative, GC and Chlamydia negative, 1 hour  Glucola 68, group B Strep positive in the urine.   The patient had a vaginal ultrasound on September 26, 2002, with a crown-rump  length 3.65 cm, 10 weeks, 4 days, [facility] April 20, 2003. The patient had been  seen earlier on Aug 31, 2002, and gestational age  was thought to be 6 weeks  and 2 days by a vaginal ultrasound. The patient had been treated with Xanax  for 60 days. She discontinued on Aug 27, 2002. She had been placed on  Klonopin. She was tapering it for 7 days. She was informed that the PDR  listed both drugs as category D.   The patient was going to decide at her first prenatal visit  whether to  carry the pregnancy. She ultimately elected to do so and requested  counseling. She was given the names of 2 different counselors. I was called  by a nurse practitioner at [facility] emergency room on November 15, 2002,  stating that the patient had been admitted with psychosis. She was not  speaking. The nurse practitioner was advised that the patient needed to  treat the psychosis and consider having a triple screen ultrasound done.   She did return to [place] for the ultrasound on November 29, 2002. It showed an  estimated gestational age of   [redacted] weeks and 3 days. At that time she was on  Ativan, Cogentin, Haldol and Lexapro.   Since then  the patient has had sporadic prenatal care. She did come for a  visit  on January 03, 2003, February 01, 2003, and her next  visit was on  April 02, 2003. She has been battling emotional problems as well as  domestic problems. She is now scheduled for a repeat cesarean section and  she desires a tubal ligation.   ALLERGIES:  No known  allergies.   PAST MEDICAL HISTORY:  She has had 3 prior cesarean sections. She had an  operation in 1991. She was smoking 1/2 pack of cigarettes per day at the  time of her prenatal care and she was advised to  stop. She has had a history  of anxiety and also has been given a diagnosis of schizoaffective disorder.  She has had a history of iron deficiency anemia.   OBSTETRIC HISTORY:  The patient delivered a 1 pound, 7 ounce infant in  September 1989 by cesarean section for severe  growth restriction and severe  preeclampsia. This was at 31 plus weeks. In 1991 she delivered a 3 pound, 15  ounce female by C-section, intrauterine growth restriction, variable  decelerations at 36 weeks. In 2000 she delivered a 3 pound, 7 ounce  female at  40 weeks by C-section. The first baby died at approximately  6  weeks of  life.   PHYSICAL EXAMINATION:  GENERAL:  A well developed, obese black female. She  has a slight tremor of her head.  VITAL SIGNS:  Blood pressure 120/70, pulse 80.  HEENT:  No cranial abnormalities. Extraocular movements intact. Nose and  pharynx clear.  NECK:  Supple without thyromegaly.  HEART:  Normal size and sounds. No murmurs.  LUNGS:  Clear to auscultation and percussion.  ABDOMEN:  Soft, nontender. Fundal height 39 cm. Fetal heart  tones are  normal.  PELVIC:  Cervix is fingertip, 25% vertex at a -3. The patient states that  she definitely wants her tubes tied.   CURRENT MEDICATIONS:  Zyprexa and Ativan.   SOCIAL HISTORY:  The patient is  not able to work. She gets SSI. She has  Medicaid. She gets a housing allowance and her mother gives her aid.   IMPRESSION:  Intrauterine pregnancy at 38 plus weeks, 3 prior cesarean  sections, voluntary sterilization, schizoaffective disorder.   PLAN:  Cesarean section and tubal ligation. The patient understands the  risks involved and is ready to proceed.                                               Malachi Pro. Ambrose Mantle, M.D.    TFH/MEDQ  D:  04/09/2003  T:  04/09/2003  Job:  [id number]

## 2010-09-04 NOTE — H&P (Signed)
[name], [name]                 ACCOUNT NO.:  [id number]   MEDICAL RECORD NO.:  [id number]          PATIENT TYPE:  INP   LOCATION:  5010                         FACILITY:  [facility]   PHYSICIAN:  Lonia Blood, M.D.      DATE OF BIRTH:  Apr 22, 1970   DATE OF ADMISSION:  05/30/2005  DATE OF DISCHARGE:  05/01/2005                                HISTORY & PHYSICAL   PRIMARY CARE PHYSICIAN:  [facility]   PRESENTING COMPLAINT:  Bright red blood per rectum.   HISTORY OF PRESENT ILLNESS:  Patient is a 40 year old African-American  female with of schizoaffective disorder and rest tremors who has been doing  okay until recently when she apparently found out that she had some low  blood count at [facility] which is about six months ago.  Per  patient she was told that that was not too low to warrant any work-up.  Patient just finished her period about a week ago.  Within the past two  weeks she has been noticing some blood in her stool.  She initially  attributed that to her period.  However, she has completed her period and  continues to see blood in her stool.  She described the blood as bright red  and usually mixed with stools which is typically hard.  Denied any nausea,  vomiting, or diarrhea.  Patient also denied any abdominal pain.  She has not  had any prior GI bleed.  She is not taking any blood thinners.  Patient was  seen initially in the ED and found to have hemoglobin 8.6 in this setting,  hence, we are going to admit her for GI bleed.   PAST MEDICAL HISTORY:  1.  Mainly GERD.  2.  Obesity.  3.  Depression.  4.  Schizoaffective disorder.   MEDICATIONS:  Cogentin, Wellbutrin, trazodone.  Patient is not sure of the  doses and somebody will have to bring it in the morning.   SOCIAL HISTORY:  Patient is widowed and has three children.  Her last child  birth was by cesarean section.  She smokes about a half pack per day now.  Denied any alcohol use, although  she previously had alcohol and marijuana.   FAMILY HISTORY:  Significant for multiple cancers according to patient.  She  is not sure which, but included throat cancer that afflicted her uncle.  Denied any diabetes, hypertension in the family.   REVIEW OF SYSTEMS:  Essentially the tremors that patient has had  continuously since she has been on antipsychotic medication.  Patient also  complains of some dizziness occasionally when she gets up and palpitations.  Otherwise, a 10-point review of systems as in HPI.   PHYSICAL EXAMINATION:  VITAL SIGNS:  Temperature 98.3, blood pressure  152/89, pulse 95, respiratory rate 20, saturations 97% on room air.  GENERAL:  She is alert, oriented.  Seems anxious, but in no acute distress.  HEENT:  PERL.  EOMI.  No visible conjunctival pallor and no jaundice.  NECK:  Supple.  No JVD.  No lymphadenopathy.  LUNGS:  Good air entry bilaterally.  No wheezes or rales.  CARDIOVASCULAR:  Patient has S1, S2.  No murmur.  ABDOMEN:  Obese, nontender.  Positive bowel sounds.  EXTREMITIES:  No edema, cyanosis, or clubbing.   LABORATORIES:  White count 10, hemoglobin 8.6, MCV 75.8, platelet count 394  with normal differential.  Her sodium is 140, potassium 3.9, chloride 109,  BUN 8, glucose 93, creatinine 1.   ASSESSMENT:  Therefore, this is a 40 year old female with bright red blood  per rectum and anemia.  Based on patient's history looks like she has had  long-standing anemia.  She has also just completed her period.  In a young  person like this her hemoglobin 8.6 may not be particularly low.  Despite  this, however, it is worrisome for possible other differentials including  inflammatory bowel disease.  It could also be a polyp.  I doubt this is  cancer or any malignancy.  Most likely this will end up being her internal  hemorrhoids or diverticular disease.  Patient also took [facility] powder when she  was having her periods two weeks ago but her bleeding seemed to  be more  lower gastrointestinal bleed, especially with a BUN of only 8.   PLAN:  1.  Bright red blood per rectum.  Will admit the patient, mainly for      observation overnight to follow her hemoglobin and make sure it is      stable.  Will type and cross match her for 2 units of packed red blood      cells but we will not transfuse until the hemoglobin falls below 8.  I      will follow serial H&H, keep her on Protonix.  No Lovenox or any blood      thinners.  I will also continue to follow her vitals.  Will call GI      consult in the morning.  Patient can be scheduled for either colonoscopy      or colonoscopy with EGD either as an inpatient or as an outpatient.  2.  Schizoaffective disorder.  This seemed to be longstanding problem, seems      to be stable at this point and I have asked patient to have someone      bring it in the morning.  3.  Obesity.  Patient has been counseled.  4.  Tobacco abuse.  I have also counseled patient on tobacco use and her      need to quit smoking, especially for her children.  5.  Rest tremors.  This could be related to some dyskinesia from her      antipsychotics.  I am very aware of that.  She is, however, stable at      this point.      Lonia Blood, M.D.  Electronically Signed     LG/MEDQ  D:  05/30/2005  T:  05/31/2005  Job:  [id number]

## 2010-09-24 ENCOUNTER — Emergency Department (HOSPITAL_COMMUNITY)
Admission: EM | Admit: 2010-09-24 | Discharge: 2010-09-24 | Disposition: A | Discharge status: Home / Self Care | Payer: Medicaid Other | Attending: Emergency Medicine | Admitting: Emergency Medicine

## 2010-09-24 ENCOUNTER — Emergency Department (HOSPITAL_COMMUNITY): Payer: Medicaid Other

## 2010-09-24 DIAGNOSIS — S8990XA Unspecified injury of unspecified lower leg, initial encounter: Secondary | ICD-10-CM | POA: Insufficient documentation

## 2010-09-24 DIAGNOSIS — M79609 Pain in unspecified limb: Secondary | ICD-10-CM | POA: Insufficient documentation

## 2010-09-24 DIAGNOSIS — Z79899 Other long term (current) drug therapy: Secondary | ICD-10-CM | POA: Insufficient documentation

## 2010-09-24 DIAGNOSIS — Z8659 Personal history of other mental and behavioral disorders: Secondary | ICD-10-CM | POA: Insufficient documentation

## 2010-09-24 DIAGNOSIS — X58XXXA Exposure to other specified factors, initial encounter: Secondary | ICD-10-CM | POA: Insufficient documentation

## 2010-09-24 DIAGNOSIS — F319 Bipolar disorder, unspecified: Secondary | ICD-10-CM | POA: Insufficient documentation

## 2010-09-24 DIAGNOSIS — S93609A Unspecified sprain of unspecified foot, initial encounter: Secondary | ICD-10-CM | POA: Insufficient documentation

## 2010-09-24 DIAGNOSIS — S99919A Unspecified injury of unspecified ankle, initial encounter: Secondary | ICD-10-CM | POA: Insufficient documentation

## 2010-11-13 ENCOUNTER — Emergency Department (HOSPITAL_COMMUNITY)
Admission: EM | Admit: 2010-11-13 | Discharge: 2010-11-13 | Disposition: A | Discharge status: Home / Self Care | Payer: Medicaid Other | Attending: Emergency Medicine | Admitting: Emergency Medicine

## 2010-11-13 DIAGNOSIS — Z79899 Other long term (current) drug therapy: Secondary | ICD-10-CM | POA: Insufficient documentation

## 2010-11-13 DIAGNOSIS — N760 Acute vaginitis: Secondary | ICD-10-CM | POA: Insufficient documentation

## 2010-11-13 DIAGNOSIS — R109 Unspecified abdominal pain: Secondary | ICD-10-CM | POA: Insufficient documentation

## 2010-11-13 DIAGNOSIS — Z8659 Personal history of other mental and behavioral disorders: Secondary | ICD-10-CM | POA: Insufficient documentation

## 2010-11-13 LAB — URINALYSIS, ROUTINE W REFLEX MICROSCOPIC
Ketones, ur: NEGATIVE mg/dL
Nitrite: NEGATIVE
Protein, ur: 30 mg/dL — AB
Urobilinogen, UA: 0.2 mg/dL (ref 0.0–1.0)
pH: 6 (ref 5.0–8.0)

## 2010-11-13 LAB — WET PREP, GENITAL
Clue Cells Wet Prep HPF POC: NONE SEEN
Yeast Wet Prep HPF POC: NONE SEEN

## 2010-11-13 LAB — URINE MICROSCOPIC-ADD ON

## 2010-11-13 LAB — POCT PREGNANCY, URINE: Preg Test, Ur: NEGATIVE

## 2010-11-14 LAB — GC/CHLAMYDIA PROBE AMP, GENITAL
Chlamydia, DNA Probe: NEGATIVE
GC Probe Amp, Genital: NEGATIVE

## 2010-11-14 LAB — RPR: RPR Ser Ql: NONREACTIVE

## 2011-01-15 LAB — POCT I-STAT, CHEM 8
BUN: 9
Chloride: 107
HCT: 38
Hemoglobin: 12.9
Hemoglobin: 12.6
Potassium: 3.9
Potassium: 3 — ABNORMAL LOW
Sodium: 138
Sodium: 141
TCO2: 27

## 2011-01-15 LAB — DIFFERENTIAL
Basophils Absolute: 0.1
Basophils Relative: 1
Eosinophils Absolute: 0.1
Eosinophils Relative: 0
Lymphocytes Relative: 31
Neutro Abs: 5.4
Neutrophils Relative %: 54
Neutrophils Relative %: 62

## 2011-01-15 LAB — RAPID URINE DRUG SCREEN, HOSP PERFORMED
Amphetamines: NOT DETECTED
Barbiturates: NOT DETECTED
Benzodiazepines: NOT DETECTED
Benzodiazepines: POSITIVE — AB
Cocaine: NOT DETECTED
Opiates: NOT DETECTED
Tetrahydrocannabinol: NOT DETECTED

## 2011-01-15 LAB — CBC
HCT: 35 — ABNORMAL LOW
MCHC: 32.5
MCV: 85
Platelets: 269
Platelets: 276
RBC: 4.13
RDW: 16.6 — ABNORMAL HIGH
WBC: 10

## 2011-01-15 LAB — POCT PREGNANCY, URINE: Preg Test, Ur: NEGATIVE

## 2011-01-15 LAB — ETHANOL: Alcohol, Ethyl (B): <5

## 2011-02-19 ENCOUNTER — Emergency Department (HOSPITAL_COMMUNITY)
Admission: EM | Admit: 2011-02-19 | Discharge: 2011-02-19 | Disposition: A | Discharge status: Home / Self Care | Payer: Medicaid Other | Attending: Emergency Medicine | Admitting: Emergency Medicine

## 2011-02-19 DIAGNOSIS — N898 Other specified noninflammatory disorders of vagina: Secondary | ICD-10-CM | POA: Insufficient documentation

## 2011-02-19 DIAGNOSIS — F319 Bipolar disorder, unspecified: Secondary | ICD-10-CM | POA: Insufficient documentation

## 2011-02-19 DIAGNOSIS — A5901 Trichomonal vulvovaginitis: Secondary | ICD-10-CM | POA: Insufficient documentation

## 2011-02-19 DIAGNOSIS — L293 Anogenital pruritus, unspecified: Secondary | ICD-10-CM | POA: Insufficient documentation

## 2011-02-19 DIAGNOSIS — K625 Hemorrhage of anus and rectum: Secondary | ICD-10-CM | POA: Insufficient documentation

## 2011-02-19 DIAGNOSIS — R072 Precordial pain: Secondary | ICD-10-CM | POA: Insufficient documentation

## 2011-02-19 DIAGNOSIS — R0602 Shortness of breath: Secondary | ICD-10-CM | POA: Insufficient documentation

## 2011-02-19 DIAGNOSIS — N390 Urinary tract infection, site not specified: Secondary | ICD-10-CM | POA: Insufficient documentation

## 2011-02-19 DIAGNOSIS — F209 Schizophrenia, unspecified: Secondary | ICD-10-CM | POA: Insufficient documentation

## 2011-02-19 LAB — URINALYSIS, ROUTINE W REFLEX MICROSCOPIC
Bilirubin Urine: NEGATIVE
Specific Gravity, Urine: 1.011 (ref 1.005–1.030)
pH: 6 (ref 5.0–8.0)

## 2011-02-19 LAB — WET PREP, GENITAL: Yeast Wet Prep HPF POC: NONE SEEN

## 2011-02-19 LAB — POCT PREGNANCY, URINE: Preg Test, Ur: NEGATIVE

## 2011-02-19 LAB — URINE MICROSCOPIC-ADD ON

## 2011-02-20 LAB — GC/CHLAMYDIA PROBE AMP, GENITAL: GC Probe Amp, Genital: NEGATIVE

## 2011-02-23 ENCOUNTER — Encounter: Payer: Self-pay | Admitting: Emergency Medicine

## 2011-02-23 ENCOUNTER — Emergency Department (HOSPITAL_COMMUNITY)
Admission: EM | Admit: 2011-02-23 | Discharge: 2011-02-23 | Discharge status: Against Medical Advice | Payer: Medicaid Other | Source: Home / Self Care

## 2011-02-23 DIAGNOSIS — R55 Syncope and collapse: Secondary | ICD-10-CM | POA: Insufficient documentation

## 2011-02-23 DIAGNOSIS — Z79899 Other long term (current) drug therapy: Secondary | ICD-10-CM | POA: Insufficient documentation

## 2011-02-23 DIAGNOSIS — R51 Headache: Secondary | ICD-10-CM | POA: Insufficient documentation

## 2011-02-23 HISTORY — DX: Bipolar disorder, unspecified: F31.9

## 2011-02-23 HISTORY — DX: Depression, unspecified: F32.A

## 2011-02-23 HISTORY — DX: Major depressive disorder, single episode, unspecified: F32.9

## 2011-02-23 NOTE — ED Notes (Signed)
Pt called EMS in [facility] waiting room. Pt refused to answer triage questions. Pt reports syncopal episode and now has a headache.

## 2011-02-23 NOTE — ED Notes (Signed)
Pt states that she was walking to the door and passed out for unknown reason, pt states that she had some rectal bleeding about three weeks ago and was seen for that with no diagnosis to mention. Pt in triage now not able to sit in wheelchair and not completely answering questions

## 2011-02-23 NOTE — ED Notes (Signed)
Pt not cooperating with RN in triage and not wanting to answer questions, states that she wanted someone else and thinks they shouldve gotten a room right away but RN tried to explain the process to her and she's arguing and beligerant to the RN. Security and [employer] at bedside.

## 2011-02-23 NOTE — ED Notes (Signed)
Pt walked out of the ED on her on without assistance

## 2011-02-24 ENCOUNTER — Emergency Department (HOSPITAL_COMMUNITY)
Admission: EM | Admit: 2011-02-24 | Discharge: 2011-02-24 | Disposition: A | Discharge status: Home / Self Care | Payer: Medicaid Other | Attending: Emergency Medicine | Admitting: Emergency Medicine

## 2011-02-24 ENCOUNTER — Encounter (HOSPITAL_COMMUNITY): Payer: Self-pay | Admitting: Radiology

## 2011-02-24 ENCOUNTER — Other Ambulatory Visit: Payer: Self-pay

## 2011-02-24 ENCOUNTER — Emergency Department (HOSPITAL_COMMUNITY): Payer: Medicaid Other

## 2011-02-24 ENCOUNTER — Observation Stay (HOSPITAL_COMMUNITY)
Admission: EM | Admit: 2011-02-24 | Discharge: 2011-02-24 | Discharge status: Against Medical Advice | Payer: Medicaid Other | Source: Ambulatory Visit | Attending: Internal Medicine | Admitting: Internal Medicine

## 2011-02-24 DIAGNOSIS — A599 Trichomoniasis, unspecified: Secondary | ICD-10-CM | POA: Diagnosis present

## 2011-02-24 DIAGNOSIS — R55 Syncope and collapse: Secondary | ICD-10-CM

## 2011-02-24 DIAGNOSIS — D649 Anemia, unspecified: Secondary | ICD-10-CM | POA: Diagnosis present

## 2011-02-24 DIAGNOSIS — F259 Schizoaffective disorder, unspecified: Secondary | ICD-10-CM | POA: Insufficient documentation

## 2011-02-24 DIAGNOSIS — F17200 Nicotine dependence, unspecified, uncomplicated: Secondary | ICD-10-CM | POA: Insufficient documentation

## 2011-02-24 DIAGNOSIS — R51 Headache: Secondary | ICD-10-CM | POA: Insufficient documentation

## 2011-02-24 DIAGNOSIS — I319 Disease of pericardium, unspecified: Secondary | ICD-10-CM

## 2011-02-24 DIAGNOSIS — I10 Essential (primary) hypertension: Secondary | ICD-10-CM | POA: Diagnosis present

## 2011-02-24 LAB — COMPREHENSIVE METABOLIC PANEL
ALT: 7 U/L (ref 0–35)
ALT: 6 U/L (ref 0–35)
AST: 11 U/L (ref 0–37)
Albumin: 4.4 g/dL (ref 3.5–5.2)
Albumin: 3.8 g/dL (ref 3.5–5.2)
Alkaline Phosphatase: 91 U/L (ref 39–117)
CO2: 25 mEq/L (ref 19–32)
Calcium: 10.2 mg/dL (ref 8.4–10.5)
Calcium: 9.8 mg/dL (ref 8.4–10.5)
GFR calc Af Amer: 73 mL/min — ABNORMAL LOW (ref >90)
Glucose, Bld: 107 mg/dL — ABNORMAL HIGH (ref 70–99)
Potassium: 3.2 mEq/L — ABNORMAL LOW (ref 3.5–5.1)
Sodium: 133 mEq/L — ABNORMAL LOW (ref 135–145)
Sodium: 136 mEq/L (ref 135–145)
Total Protein: 9.1 g/dL — ABNORMAL HIGH (ref 6.0–8.3)
Total Protein: 8.5 g/dL — ABNORMAL HIGH (ref 6.0–8.3)

## 2011-02-24 LAB — IRON AND TIBC
Iron: 47 ug/dL (ref 42–135)
UIBC: 356 ug/dL (ref 125–400)

## 2011-02-24 LAB — CBC
HCT: 29.3 % — ABNORMAL LOW (ref 36.0–46.0)
Hemoglobin: 9.4 g/dL — ABNORMAL LOW (ref 12.0–15.0)
MCH: 24.4 pg — ABNORMAL LOW (ref 26.0–34.0)
MCHC: 31.9 g/dL (ref 30.0–36.0)
Platelets: 355 10*3/uL (ref 150–400)
RDW: 16.8 % — ABNORMAL HIGH (ref 11.5–15.5)
RDW: 16.9 % — ABNORMAL HIGH (ref 11.5–15.5)
WBC: 9.7 10*3/uL (ref 4.0–10.5)

## 2011-02-24 LAB — POCT I-STAT TROPONIN I

## 2011-02-24 LAB — PREGNANCY, URINE: Preg Test, Ur: NEGATIVE

## 2011-02-24 LAB — FOLATE: Folate: 5.9 ng/mL

## 2011-02-24 LAB — TSH: TSH: 3.945 u[IU]/mL (ref 0.350–4.500)

## 2011-02-24 LAB — DIFFERENTIAL
Basophils Relative: 0 % (ref 0–1)
Eosinophils Absolute: 0 10*3/uL (ref 0.0–0.7)
Neutrophils Relative %: 76 % (ref 43–77)

## 2011-02-24 LAB — RETICULOCYTES: Retic Count, Absolute: 30.6 10*3/uL (ref 19.0–186.0)

## 2011-02-24 MED ORDER — SODIUM CHLORIDE 0.9 % IJ SOLN
3.0000 mL | Freq: Two times a day (BID) | INTRAMUSCULAR | Status: DC
  Administered 2011-02-24: 3 mL via INTRAVENOUS

## 2011-02-24 MED ORDER — SODIUM CHLORIDE 0.9 % IV BOLUS (SEPSIS)
1000.0000 mL | Freq: Once | INTRAVENOUS | Status: AC
  Administered 2011-02-24: 1000 mL via INTRAVENOUS

## 2011-02-24 MED ORDER — IOHEXOL 350 MG/ML SOLN
100.0000 mL | Freq: Once | INTRAVENOUS | Status: AC | PRN
  Administered 2011-02-24: 100 mL via INTRAVENOUS

## 2011-02-24 MED ORDER — LORAZEPAM 0.5 MG PO TABS
0.5000 mg | ORAL_TABLET | Freq: Three times a day (TID) | ORAL | Status: DC
  Filled 2011-02-24: qty 1

## 2011-02-24 MED ORDER — QUETIAPINE FUMARATE 100 MG PO TABS
100.0000 mg | ORAL_TABLET | Freq: Every day | ORAL | Status: DC
  Filled 2011-02-24: qty 1

## 2011-02-24 MED ORDER — ACETAMINOPHEN 325 MG PO TABS: 650.0000 mg | ORAL_TABLET | Freq: Four times a day (QID) | ORAL | Status: DC | PRN

## 2011-02-24 MED ORDER — HYDROCHLOROTHIAZIDE 25 MG PO TABS
25.0000 mg | ORAL_TABLET | Freq: Every day | ORAL | Status: DC
  Administered 2011-02-24: 25 mg via ORAL
  Filled 2011-02-24: qty 1

## 2011-02-24 MED ORDER — ACETAMINOPHEN 650 MG RE SUPP: 650.0000 mg | Freq: Four times a day (QID) | RECTAL | Status: DC | PRN

## 2011-02-24 MED ORDER — SODIUM CHLORIDE 0.9 % IV SOLN
250.0000 mL | INTRAVENOUS | Status: DC
  Administered 2011-02-24: 250 mL via INTRAVENOUS

## 2011-02-24 MED ORDER — DIPHENHYDRAMINE HCL 50 MG/ML IJ SOLN
25.0000 mg | Freq: Once | INTRAMUSCULAR | Status: AC
  Administered 2011-02-24: 50 mg via INTRAVENOUS
  Filled 2011-02-24: qty 1

## 2011-02-24 MED ORDER — DEXTROSE 5 % IV SOLN
1.0000 g | Freq: Once | INTRAVENOUS | Status: DC
  Filled 2011-02-24: qty 10

## 2011-02-24 MED ORDER — SODIUM CHLORIDE 0.9 % IJ SOLN: 3.0000 mL | INTRAMUSCULAR | Status: DC | PRN

## 2011-02-24 MED ORDER — NITROFURANTOIN MACROCRYSTAL 100 MG PO CAPS
100.0000 mg | ORAL_CAPSULE | Freq: Two times a day (BID) | ORAL | Status: DC
  Administered 2011-02-24: 100 mg via ORAL
  Filled 2011-02-24 (×2): qty 1

## 2011-02-24 MED ORDER — ONDANSETRON HCL 4 MG/2ML IJ SOLN: 4.0000 mg | Freq: Four times a day (QID) | INTRAMUSCULAR | Status: DC | PRN

## 2011-02-24 MED ORDER — ONDANSETRON HCL 4 MG PO TABS: 4.0000 mg | ORAL_TABLET | Freq: Four times a day (QID) | ORAL | Status: DC | PRN

## 2011-02-24 MED ORDER — METOCLOPRAMIDE HCL 5 MG/ML IJ SOLN
10.0000 mg | Freq: Once | INTRAMUSCULAR | Status: AC
  Administered 2011-02-24: 10 mg via INTRAVENOUS
  Filled 2011-02-24: qty 2

## 2011-02-24 MED ORDER — POTASSIUM CHLORIDE CRYS ER 20 MEQ PO TBCR
40.0000 meq | EXTENDED_RELEASE_TABLET | Freq: Once | ORAL | Status: AC
  Administered 2011-02-24: 40 meq via ORAL
  Filled 2011-02-24: qty 2

## 2011-02-24 NOTE — ED Notes (Signed)
Pt to xray and CT

## 2011-02-24 NOTE — Discharge Summary (Signed)
  Physician Discharge Summary  Patient ID: [name] MRN: [id number] DOB/AGE: [birth year] 40 y.o.  Admit date: 02/24/2011 Discharge date: 02/24/2011  LEFT AMA  Primary Care Physician:  [facility]   Discharge Diagnoses: 1. Syncope: unclear etiology 2. Anemia 3. h/o external hemorrhoids 4. STD 5. Hypertension 6. Tobacco abuse disorder 7. Schizoaffective disorder    Significant Diagnostic Studies:  No results found.  Brief H and P: 40 year old female with H/O Bipolar, schizoaffective disorder who was recently started on diuretics for hypertension last evening had a brief episode of loss of consciousness as witnessed by her bother. The Whole episode would have lasted around two to three minutes. There was no associated headache or focal deficits or seizure like activity. In the ER patient had CT Head, CT angio to r/o PE which all did not anything acute. Blood work showed mild hypokalemia and anemia. Patient states she had h/o anemia which had improved and of recently she has blood in the sools from hemorrhoids for which she was referred to a surgeon.  Patient was not orthostatic in ER.      Hospital Course: LEFT AMA Principal Problem:  *Syncope: Workup for this has been unremarkable,  CT head/EKG did not show any abnormalities. She's not had any events on telemetry, had a 2-D echo done today the results of which are pending and tox screen is pending. Active Problems:  Anemia: Followup anemia panel probably will need to start p.o. iron, has history of external hemorrhoids from a colonoscopy in 2007. She denies heavy menstrual bleeding.  Trichomonas infection: Has been treated with tinidazole as outpatient  Hypertension: Orthostatics pending at this point patient clinically asymptomatic from dizziness or syncope standpoint Schizoaffective disorder: Continue Seroquel also check tox screen  Tobacco abuse: She is been adamant about smoking in the hospital and has been advised that  this was not an option and we would not allow her to smoke here, she is adamant to leave against medical advise and reports that she will just present back to the emergency room.  If she comes back to the ER and her vital signs are stable and workup is otherwise unremarkable would recommend having her followup with her PCPs as outpatient   Time spent on Discharge:   Signed: Luigi Stuckey 02/24/2011, 2:37 PM

## 2011-02-24 NOTE — ED Notes (Signed)
Pt to ct at this time.

## 2011-02-24 NOTE — ED Provider Notes (Addendum)
History     CSN: [id number] Arrival date & time: 02/24/2011 12:23 AM   First MD Initiated Contact with Patient 02/24/11 0026      Chief Complaint  Patient presents with  . Headache    (Consider location/radiation/quality/duration/timing/severity/associated sxs/prior treatment) HPI Comments: Patient states that she sit up and walk 6 steps and had a syncopal episode. She had no preceding chest pain, shortness of breath, abdominal pain, headache. She denies prior syncopal episodes. She now complains of posterior headache but has no additional symptoms. She states that she is weak and has a history of rectal bleeding.  Patient is a 40 y.o. female presenting with syncope. The history is provided by the patient. No language interpreter was used.  Loss of Consciousness This is a new problem. The current episode started 3 to 5 hours ago. The problem occurs rarely. The problem has been resolved. Associated symptoms include headaches. Pertinent negatives include no chest pain, no abdominal pain and no shortness of breath. The symptoms are aggravated by nothing. The symptoms are relieved by nothing. She has tried nothing for the symptoms.    Past Medical History  Diagnosis Date  . Depression   . Bipolar 1 disorder   . Hypertension     Past Surgical History  Procedure Date  . Cesarean section     Family History  Problem Relation Age of Onset  . Diabetes type II Other     History  Substance Use Topics  . Smoking status: Current Everyday Smoker  . Smokeless tobacco: Not on file  . Alcohol Use:     OB History    Grav Para Term Preterm Abortions TAB SAB Ect Mult Living                  Review of Systems  Constitutional: Positive for fatigue. Negative for fever, activity change and appetite change.  HENT: Negative for congestion, sore throat, rhinorrhea, neck pain and neck stiffness.   Eyes: Negative for photophobia and visual disturbance.  Respiratory: Negative for cough, chest  tightness and shortness of breath.   Cardiovascular: Positive for syncope. Negative for chest pain and palpitations.  Gastrointestinal: Positive for blood in stool (nothing recently). Negative for nausea, vomiting and abdominal pain.  Genitourinary: Negative for dysuria, urgency, frequency and flank pain.  Musculoskeletal: Negative for back pain and gait problem.  Skin: Negative for pallor and rash.  Neurological: Positive for syncope, light-headedness and headaches. Negative for dizziness, tremors, weakness and numbness.  All other systems reviewed and are negative.    Allergies  Sulfa antibiotics  Home Medications   Current Outpatient Rx  Name Route Sig Dispense Refill  . AMILORIDE-HYDROCHLOROTHIAZIDE 5-50 MG PO TABS Oral Take 1 tablet by mouth daily.      Marland Kitchen LORAZEPAM 0.5 MG PO TABS Oral Take 0.5 mg by mouth every 8 (eight) hours. anxiety     . NITROFURANTOIN MACROCRYSTAL 100 MG PO CAPS Oral Take 100 mg by mouth 2 (two) times daily. For 7 days beginning 02/22/11     . QUETIAPINE FUMARATE 100 MG PO TABS Oral Take 100 mg by mouth at bedtime.      Marland Kitchen TINIDAZOLE 500 MG PO TABS Oral Take 2 g by mouth once.        BP 123/73  Pulse 95  Physical Exam  Nursing note and vitals reviewed. Constitutional: She is oriented to person, place, and time. She appears well-developed and well-nourished. No distress.  HENT:  Head: Normocephalic and atraumatic.  Mouth/Throat: Oropharynx  is clear and moist. No oropharyngeal exudate.  Eyes: Conjunctivae and EOM are normal. Pupils are equal, round, and reactive to light.  Neck: Normal range of motion. Neck supple.  Cardiovascular: Normal rate, regular rhythm, normal heart sounds and intact distal pulses.  Exam reveals no gallop and no friction rub.   No murmur heard. Pulmonary/Chest: Effort normal and breath sounds normal. No respiratory distress. She exhibits no tenderness.  Abdominal: Soft. Bowel sounds are normal. There is no tenderness.    Musculoskeletal: Normal range of motion. She exhibits no tenderness.  Neurological: She is alert and oriented to person, place, and time. She has normal reflexes. No cranial nerve deficit. Coordination normal.  Skin: Skin is warm and dry. No rash noted.    ED Course  Procedures (including critical care time)   Date: 02/24/2011  Rate: 60  Rhythm: normal sinus rhythm  QRS Axis: normal  Intervals: normal  ST/T Wave abnormalities: normal  Conduction Disutrbances:none  Narrative Interpretation:   Old EKG Reviewed: no change  Labs Reviewed  CBC - Abnormal; Notable for the following:    WBC 12.8 (*)    Hemoglobin 9.8 (*)    HCT 30.7 (*)    MCV 76.6 (*)    MCH 24.4 (*)    RDW 16.8 (*)    All other components within normal limits  DIFFERENTIAL - Abnormal; Notable for the following:    Neutro Abs 9.8 (*)    All other components within normal limits  COMPREHENSIVE METABOLIC PANEL - Abnormal; Notable for the following:    Sodium 133 (*)    Potassium 3.2 (*)    Glucose, Bld 107 (*)    Total Protein 9.1 (*)    GFR calc non Af Amer 63 (*)    GFR calc Af Amer 73 (*)    All other components within normal limits  D-DIMER, QUANTITATIVE - Abnormal; Notable for the following:    D-Dimer, Quant 0.75 (*)    All other components within normal limits  I-STAT TROPONIN I   Dg Chest 2 View  02/24/2011  *RADIOLOGY REPORT*  Clinical Data: Syncope  CHEST - 2 VIEW  Comparison: None.  Findings: The heart is mildly enlarged.  Pulmonary vascularity is within normal limits.  Bilateral linear atelectasis.  No pneumothorax.  No obvious acute bony deformity.  IMPRESSION: Bilateral linear atelectasis.  Cardiomegaly without edema.  Original Report Authenticated By: Donavan Burnet, M.D.   Ct Head Wo Contrast  02/24/2011  *RADIOLOGY REPORT*  Clinical Data: Syncopal episode.  CT HEAD WITHOUT CONTRAST  Technique:  Contiguous axial images were obtained from the base of the skull through the vertex without  contrast.  Comparison: None.  Findings: There is no evidence for acute hemorrhage, hydrocephalus, mass lesion, or abnormal extra-axial fluid collection.  No definite CT evidence for acute infarction.  The visualized paranasal sinuses and mastoid air cells are predominately clear.  IMPRESSION: No acute intracranial abnormality.  Original Report Authenticated By: Waneta Martins, M.D.   Ct Angio Chest W/cm &/or Wo Cm  02/24/2011  *RADIOLOGY REPORT*  Clinical Data:  Syncope and elevated D-dimer.  CT ANGIOGRAPHY CHEST WITH CONTRAST  Technique:  Multidetector CT imaging of the chest was performed using the standard protocol during bolus administration of intravenous contrast.  Multiplanar CT image reconstructions including MIPs were obtained to evaluate the vascular anatomy.  Contrast: OMNIPAQUE IOHEXOL 350 MG/ML IV SOLN  Comparison:  02/24/2011 radiograph  Findings:  No pulmonary arterial branch filling defect.  Mild cardiomegaly.  Normal caliber aorta and great vessels.  Small pericardial effusion.  No pleural effusion.  No intrathoracic lymphadenopathy.  Minimal bibasilar dependent atelectasis.  Otherwise, no focal consolidation.  No pneumothorax.  Central airways are patent.  Limited images through the upper abdomen show no acute abnormality. Subcentimeter hypodensity at the hepatic dome demonstrates peripheral nodular enhancement, most in keeping with a hemangioma.  No acute osseous abnormality.  Review of the MIP images confirms the above findings.  IMPRESSION: No pulmonary embolism.  Cardiomegaly and small pericardial effusion.  Otherwise, no acute intrathoracic process identified.  Original Report Authenticated By: Waneta Martins, M.D.     1. Syncope       MDM  Patient signs and symptoms are concerning given the lack of preceding symptoms prior to her syncopal episode. I am concerned about cardiac etiology. Her EKG was negative. Her workup was relatively unremarkable except for elevated  d-dimer however her CT angioma chest was negative for pulmonary embolus. Cardiac markers are negative. I discussed the case with the triad hospitalists who will admit the patient for further evaluation and treatment.        Dayton Bailiff, MD 02/24/11 [phone]  Orthostatics were negative.  Dayton Bailiff, MD 02/24/11 [phone]

## 2011-02-24 NOTE — ED Notes (Signed)
Family at bedside. 

## 2011-02-24 NOTE — Progress Notes (Addendum)
Patient around 1430 wanted to go outside and smoke. Explained to her that if she left to do that she would be discharged and not be able to come back to her room.  I explained she would have to be readmitted if she wanted to come back.  She was agitated and just wanted to leave and was anxious  while care coordinator went to retrieve medications and cell phone from Pharmacy and security.  Refused to sign the AMA paper but finally agreed to let me take out both of her IV's.  Patient was calmer by the time she finally left around 1500.  Chales Abrahams [place] PA had been talking to her and calmed her down. Patient's skin was intact.  Bing Duffey Consuella Lose  02/24/2011  4:16 PM

## 2011-02-24 NOTE — Progress Notes (Signed)
Patient  Admitted with syncope, anemia.  Patient left AMA. [name] [phone]

## 2011-02-24 NOTE — ED Provider Notes (Signed)
This chart was created in error and was remedied. Please see the following chart for the details of encoutner: Rush Landmark, MD 02/24/11 [phone]

## 2011-02-24 NOTE — H&P (Signed)
[name] is an 40 y.o. female.   Chief Complaint: Lost consciousness HPI: 40 year old female with H/O Bipolar, schizoaffective disorder who was recently started on diuretics for hypertension last evening had a brief episode of loss of consciousness as witnessed by her bother. The  Whole episode would have lasted around two to three minutes. There was no associated headache or focal deficits or seizure like activity. In the ER patient had CT Head, CT angio to r/o PE which all did not anything acute. Blood work showed mild hypokalemia and anemia. Patient states she had h/o anemia which had improved and of recently she has blood in the sools from hemorrhoids for which she was referred to a surgeon. Patient was not orthostatic in ER. Denies any dizziness palpitions or chest pain. Was treated last week for trichomoniasis and UTI.  Past Medical History  Diagnosis Date  . Depression   . Bipolar 1 disorder   . Hypertension     Past Surgical History  Procedure Date  . Cesarean section     Family History  Problem Relation Age of Onset  . Diabetes type II Other    Social History:  reports that she has been smoking.  She does not have any smokeless tobacco history on file. Her alcohol and drug histories not on file.  Allergies:  Allergies  Allergen Reactions  . Sulfa Antibiotics     nausea    Medications Prior to Admission  Medication Dose Route Frequency Provider Last Rate Last Dose  . diphenhydrAMINE (BENADRYL) injection 25 mg  25 mg Intravenous Once Dayton Bailiff, MD   50 mg at 02/24/11 0106  . iohexol (OMNIPAQUE) 350 MG/ML injection 100 mL  100 mL Intravenous Once PRN Medication Radiologist   100 mL at 02/24/11 0322  . metoCLOPramide (REGLAN) injection 10 mg  10 mg Intravenous Once Dayton Bailiff, MD   10 mg at 02/24/11 0106  . potassium chloride SA (K-DUR,KLOR-CON) CR tablet 40 mEq  40 mEq Oral Once Dayton Bailiff, MD   40 mEq at 02/24/11 0133  . sodium chloride 0.9 % bolus 1,000 mL  1,000  mL Intravenous Once Dayton Bailiff, MD   1,000 mL at 02/24/11 0106   No current outpatient prescriptions on file as of 02/24/2011.    Results for orders placed during the hospital encounter of 02/24/11 (from the past 48 hour(s))  CBC     Status: Abnormal   Collection Time   02/24/11 12:28 AM      Component Value Range Comment   WBC 12.8 (*) 4.0 - 10.5 (K/uL)    RBC 4.01  3.87 - 5.11 (MIL/uL)    Hemoglobin 9.8 (*) 12.0 - 15.0 (g/dL)    HCT 16.1 (*) 09.6 - 46.0 (%)    MCV 76.6 (*) 78.0 - 100.0 (fL)    MCH 24.4 (*) 26.0 - 34.0 (pg)    MCHC 31.9  30.0 - 36.0 (g/dL)    RDW 04.5 (*) 40.9 - 15.5 (%)    Platelets 355  150 - 400 (K/uL)   DIFFERENTIAL     Status: Abnormal   Collection Time   02/24/11 12:28 AM      Component Value Range Comment   Neutrophils Relative 76  43 - 77 (%)    Neutro Abs 9.8 (*) 1.7 - 7.7 (K/uL)    Lymphocytes Relative 18  12 - 46 (%)    Lymphs Abs 2.3  0.7 - 4.0 (K/uL)    Monocytes Relative 6  3 - 12 (%)    Monocytes Absolute 0.7  0.1 - 1.0 (K/uL)    Eosinophils Relative 0  0 - 5 (%)    Eosinophils Absolute 0.0  0.0 - 0.7 (K/uL)    Basophils Relative 0  0 - 1 (%)    Basophils Absolute 0.1  0.0 - 0.1 (K/uL)   COMPREHENSIVE METABOLIC PANEL     Status: Abnormal   Collection Time   02/24/11 12:28 AM      Component Value Range Comment   Sodium 133 (*) 135 - 145 (mEq/L)    Potassium 3.2 (*) 3.5 - 5.1 (mEq/L)    Chloride 98  96 - 112 (mEq/L)    CO2 24  19 - 32 (mEq/L)    Glucose, Bld 107 (*) 70 - 99 (mg/dL)    BUN 12  6 - 23 (mg/dL)    Creatinine, Ser 1.61  0.50 - 1.10 (mg/dL)    Calcium 09.6  8.4 - 10.5 (mg/dL)    Total Protein 9.1 (*) 6.0 - 8.3 (g/dL)    Albumin 4.4  3.5 - 5.2 (g/dL)    AST 11  0 - 37 (U/L)    ALT 7  0 - 35 (U/L)    Alkaline Phosphatase 91  39 - 117 (U/L)    Total Bilirubin 0.3  0.3 - 1.2 (mg/dL)    GFR calc non Af Amer 63 (*) >90 (mL/min)    GFR calc Af Amer 73 (*) >90 (mL/min)   D-DIMER, QUANTITATIVE     Status: Abnormal   Collection Time     02/24/11 12:28 AM      Component Value Range Comment   D-Dimer, Quant 0.75 (*) 0.00 - 0.48 (ug/mL-FEU)   POCT I-STAT TROPONIN I     Status: Normal   Collection Time   02/24/11  4:55 AM      Component Value Range Comment   Troponin i, poc 0.00  0.00 - 0.08 (ng/mL)    Comment 3             Dg Chest 2 View  02/24/2011  *RADIOLOGY REPORT*  Clinical Data: Syncope  CHEST - 2 VIEW  Comparison: None.  Findings: The heart is mildly enlarged.  Pulmonary vascularity is within normal limits.  Bilateral linear atelectasis.  No pneumothorax.  No obvious acute bony deformity.  IMPRESSION: Bilateral linear atelectasis.  Cardiomegaly without edema.  Original Report Authenticated By: Donavan Burnet, M.D.   Ct Head Wo Contrast  02/24/2011  *RADIOLOGY REPORT*  Clinical Data: Syncopal episode.  CT HEAD WITHOUT CONTRAST  Technique:  Contiguous axial images were obtained from the base of the skull through the vertex without contrast.  Comparison: None.  Findings: There is no evidence for acute hemorrhage, hydrocephalus, mass lesion, or abnormal extra-axial fluid collection.  No definite CT evidence for acute infarction.  The visualized paranasal sinuses and mastoid air cells are predominately clear.  IMPRESSION: No acute intracranial abnormality.  Original Report Authenticated By: Waneta Martins, M.D.   Ct Angio Chest W/cm &/or Wo Cm  02/24/2011  *RADIOLOGY REPORT*  Clinical Data:  Syncope and elevated D-dimer.  CT ANGIOGRAPHY CHEST WITH CONTRAST  Technique:  Multidetector CT imaging of the chest was performed using the standard protocol during bolus administration of intravenous contrast.  Multiplanar CT image reconstructions including MIPs were obtained to evaluate the vascular anatomy.  Contrast: OMNIPAQUE IOHEXOL 350 MG/ML IV SOLN  Comparison:  02/24/2011 radiograph  Findings:  No pulmonary arterial branch filling  defect.  Mild cardiomegaly.  Normal caliber aorta and great vessels.  Small pericardial  effusion.  No pleural effusion.  No intrathoracic lymphadenopathy.  Minimal bibasilar dependent atelectasis.  Otherwise, no focal consolidation.  No pneumothorax.  Central airways are patent.  Limited images through the upper abdomen show no acute abnormality. Subcentimeter hypodensity at the hepatic dome demonstrates peripheral nodular enhancement, most in keeping with a hemangioma.  No acute osseous abnormality.  Review of the MIP images confirms the above findings.  IMPRESSION: No pulmonary embolism.  Cardiomegaly and small pericardial effusion.  Otherwise, no acute intrathoracic process identified.  Original Report Authenticated By: Waneta Martins, M.D.    Review of Systems  Constitutional: Negative.   HENT: Negative.   Eyes: Negative.   Respiratory: Negative.   Cardiovascular: Negative.   Gastrointestinal: Positive for blood in stool.       Has been passing blood in the stools recently from hemorrhoids  Genitourinary: Negative.   Musculoskeletal: Negative.   Skin: Negative.   Neurological: Positive for loss of consciousness.  Endo/Heme/Allergies: Negative.   Psychiatric/Behavioral: Negative.     Blood pressure 123/73, pulse 95. Physical Exam  Constitutional: She is oriented to person, place, and time. She appears well-developed and well-nourished.  HENT:  Head: Normocephalic and atraumatic.  Eyes: Conjunctivae and EOM are normal. Pupils are equal, round, and reactive to light.  Neck: Normal range of motion. Neck supple.  Cardiovascular: Normal rate and regular rhythm.   Respiratory: Effort normal and breath sounds normal.  GI: Soft. Bowel sounds are normal.  Musculoskeletal: Normal range of motion.  Neurological: She is alert and oriented to person, place, and time. She has normal reflexes.  Skin: Skin is warm and dry.  Psychiatric: Her behavior is normal.     Assessment/Plan 1)Syncope 2)Anemia 3)Hypertension  Plan: Admit for observation to telemetry Will get Urine  drug screen EEG and 2D ECHO For anemia will check anemia panel repeat CBC in AM. Further recommendation as condition evolves.  Giancarlos Berendt N. 02/24/2011, 5:11 AM

## 2011-02-24 NOTE — ED Notes (Signed)
Patient is resting comfortably. 

## 2011-02-24 NOTE — Progress Notes (Signed)
Upon entering the patient's room I could smell cigarettes.  I notice an opened bag in front of her with a cigarette pack (Newports) and 5 prescription bottles in it.  I took the cigarettes and let her know that I would be locking them up in the med cart that she could not have these in the room.  I also told her that the medications would need to be locked up as well.  Alona Bene, RN assisted in counting meds and taking them down to pharmacy and locked phone in security.

## 2011-02-24 NOTE — ED Notes (Signed)
Pt reports syncopal episode in which she lost consciousness. Pt reports she has been taking "water pills" for 2 days and limiting her intake of water since she has been urinating so much. Pt reports headache now and feeling weak.

## 2011-02-24 NOTE — ED Notes (Signed)
Vital signs stable. 

## 2011-02-24 NOTE — Plan of Care (Signed)
Problem: Discharge Progression Outcomes Goal: Discharge plan in place and appropriate Outcome: Progressing Patient left AMA

## 2011-02-24 NOTE — Progress Notes (Signed)
UR review completed. 

## 2011-02-24 NOTE — Progress Notes (Signed)
  Echocardiogram 2D Echocardiogram has been performed.  [name] 02/24/2011, 12:01 PM

## 2011-02-25 ENCOUNTER — Other Ambulatory Visit (HOSPITAL_COMMUNITY): Payer: No Typology Code available for payment source

## 2011-02-25 LAB — DRUGS OF ABUSE SCREEN W/O ALC, ROUTINE URINE
Barbiturate Quant, Ur: NEGATIVE
Benzodiazepines.: NEGATIVE
Cocaine Metabolites: NEGATIVE
Methadone: NEGATIVE
Phencyclidine (PCP): NEGATIVE

## 2011-03-01 ENCOUNTER — Inpatient Hospital Stay: Payer: Self-pay | Admitting: Internal Medicine

## 2011-03-02 ENCOUNTER — Encounter (HOSPITAL_COMMUNITY): Payer: Self-pay | Admitting: Emergency Medicine

## 2011-03-02 DIAGNOSIS — I517 Cardiomegaly: Secondary | ICD-10-CM

## 2011-03-05 ENCOUNTER — Emergency Department (HOSPITAL_COMMUNITY)
Admission: EM | Admit: 2011-03-05 | Discharge: 2011-03-06 | Disposition: A | Discharge status: Home / Self Care | Payer: Medicaid Other | Attending: Emergency Medicine | Admitting: Emergency Medicine

## 2011-03-05 ENCOUNTER — Encounter (HOSPITAL_COMMUNITY): Payer: Self-pay

## 2011-03-05 DIAGNOSIS — F17200 Nicotine dependence, unspecified, uncomplicated: Secondary | ICD-10-CM | POA: Insufficient documentation

## 2011-03-05 DIAGNOSIS — F319 Bipolar disorder, unspecified: Secondary | ICD-10-CM | POA: Insufficient documentation

## 2011-03-05 DIAGNOSIS — I10 Essential (primary) hypertension: Secondary | ICD-10-CM | POA: Insufficient documentation

## 2011-03-05 DIAGNOSIS — H9201 Otalgia, right ear: Secondary | ICD-10-CM

## 2011-03-05 DIAGNOSIS — F411 Generalized anxiety disorder: Secondary | ICD-10-CM | POA: Insufficient documentation

## 2011-03-05 DIAGNOSIS — F419 Anxiety disorder, unspecified: Secondary | ICD-10-CM

## 2011-03-05 DIAGNOSIS — H9209 Otalgia, unspecified ear: Secondary | ICD-10-CM | POA: Insufficient documentation

## 2011-03-05 DIAGNOSIS — R209 Unspecified disturbances of skin sensation: Secondary | ICD-10-CM | POA: Insufficient documentation

## 2011-03-05 DIAGNOSIS — R259 Unspecified abnormal involuntary movements: Secondary | ICD-10-CM | POA: Insufficient documentation

## 2011-03-05 HISTORY — DX: Personal history of other medical treatment: Z92.89

## 2011-03-05 HISTORY — DX: Unspecified hemorrhoids: K64.9

## 2011-03-05 MED ORDER — LORAZEPAM 1 MG PO TABS
ORAL_TABLET | ORAL | Status: AC
  Filled 2011-03-05: qty 1

## 2011-03-05 MED ORDER — IBUPROFEN 800 MG PO TABS
800.0000 mg | ORAL_TABLET | Freq: Once | ORAL | Status: AC
  Administered 2011-03-06: 800 mg via ORAL
  Filled 2011-03-05: qty 1

## 2011-03-05 MED ORDER — LORAZEPAM 1 MG PO TABS
1.0000 mg | ORAL_TABLET | Freq: Once | ORAL | Status: AC
  Administered 2011-03-05: 1 mg via ORAL

## 2011-03-05 NOTE — ED Notes (Signed)
Upon arrival, pt c/o earache to EMS. Upon arrival to hospital pt verbally abuse EMS, and c/o numbness.

## 2011-03-05 NOTE — ED Provider Notes (Signed)
History    patient presents to the ED with a chief complaint of right ear pain, and full body numbness and tremors. Patient states she had a blood transfusion today. She returned home, and subsequently noticed increasing pain to the right ear. Described pain as sharp and throbbing. She denies ringing in her ear, all recent medication changes. The patient subsequently developed sensation of tingling and numbness throughout her entire body. She also noticed that she started to shakes and tremors.  She complains of tingling sensation around the mouth and into her fingers. She complained that she is unable to ambulate because her body is numb.  Patient does have history of bipolar, depression. She does take Ativan at home but had not it today.  Patient denies fever, vision changes, chest pain, shortness of breath, nausea, vomiting, diarrhea, abdominal pain. She was noted to be verbally abusive to the staff, upon coming to the ED. CSN: [id number] Arrival date & time: 03/05/2011  9:06 PM   First MD Initiated Contact with Patient 03/05/11 2125      Chief Complaint  Patient presents with  . Otalgia    numbness bilateral hands    (Consider location/radiation/quality/duration/timing/severity/associated sxs/prior treatment) HPI  Past Medical History  Diagnosis Date  . Depression   . Bipolar 1 disorder   . Hypertension   . Hemorrhoid   . Transfusion history     Past Surgical History  Procedure Date  . Cesarean section     Family History  Problem Relation Age of Onset  . Diabetes type II Other     History  Substance Use Topics  . Smoking status: Current Everyday Smoker -- 0.5 packs/day for 13 years  . Smokeless tobacco: Not on file  . Alcohol Use: No    OB History    Grav Para Term Preterm Abortions TAB SAB Ect Mult Living                  Review of Systems  All other systems reviewed and are negative.    Allergies  Sulfa antibiotics  Home Medications   Current  Outpatient Rx  Name Route Sig Dispense Refill  . LORAZEPAM 0.5 MG PO TABS Oral Take 0.5 mg by mouth every 8 (eight) hours. anxiety     . QUETIAPINE FUMARATE 100 MG PO TABS Oral Take 100 mg by mouth at bedtime.      Marland Kitchen TINIDAZOLE 500 MG PO TABS Oral Take 2 g by mouth once.        BP 143/78  Pulse 88  Temp(Src) 99.1 F (37.3 C) (Oral)  SpO2 100%  LMP 02/23/2011  Physical Exam  Nursing note and vitals reviewed. Constitutional: She is oriented to person, place, and time. She appears well-developed and well-nourished. No distress.  HENT:  Head: Normocephalic and atraumatic.  Right Ear: External ear normal.  Left Ear: External ear normal.  Eyes: Conjunctivae are normal.  Neck: Normal range of motion. Neck supple.  Cardiovascular: Normal rate and regular rhythm.  Exam reveals no gallop and no friction rub.   No murmur heard. Pulmonary/Chest: Effort normal. No respiratory distress. She has no wheezes.  Abdominal: Soft. Bowel sounds are normal. There is no tenderness.  Musculoskeletal: Normal range of motion.  Neurological: She is alert and oriented to person, place, and time. She has normal reflexes. She displays normal reflexes. No cranial nerve deficit. She exhibits normal muscle tone. Coordination normal.  Skin: Skin is warm and dry.  Psychiatric: Her speech is normal. Judgment and  thought content normal. Her mood appears anxious. She is aggressive. Cognition and memory are normal.    ED Course  Procedures (including critical care time)  Labs Reviewed - No data to display No results found.   No diagnosis found.    MDM  Patient with right hip pain, with out obvious finding on initial exam. Patient has no focal neuro deficit, and normal deep tendon reflex 2 both knees. She appears to be anxious, however vital signs are stable. I advised patient that the symptoms may be related to anxiety. I will continue to monitor patient until she feels back to her normal baseline.  11:49  PM After 1 hour of monitoring patient states she is feeling better and she's ready to go home. He does complain of R ear pain.  I will give NSAIDS.       Fayrene Helper, PA 03/06/11 [phone]

## 2011-03-06 LAB — GLUCOSE, CAPILLARY

## 2011-03-06 NOTE — ED Provider Notes (Signed)
Medical screening examination/treatment/procedure(s) were conducted as a shared visit with non-physician practitioner(s) and myself.  I personally evaluated the patient during the encounter.  Normal neuro exam.  Patient feeling much better after oral Ativan. No acute process noted  Donnetta Hutching, MD 03/06/11 [phone]

## 2011-03-06 NOTE — ED Notes (Signed)
Addendum: After pt was discharged, pt remained in room and refused to leave. Team Leader, Inetta Fermo, RN went into speak with patient, and patient asked that nurse speak w/ her brother over the phone. Pt's brother was advised that pt was discharged. Pt's brother asked if we could just keep patient overnight to provide TLC. Requested that pt's brother come to hospital to provide said TLC, or inquire if patient could stay with him for a couple of days. Pt's brother refused and asked again to just let pt stay in hospital til morning. Pt and pt's brother advised multiple times that pt is medically cleared and her emergent condition has been ruled out. Pt advised to follow up with family physician. (Ghim Md notified of situation). Barbara Cower, charge nurse then notified to speak with patient. Pt was wheeled out with charge nurse.

## 2011-03-08 ENCOUNTER — Encounter (HOSPITAL_COMMUNITY): Payer: Self-pay

## 2011-07-28 ENCOUNTER — Encounter (HOSPITAL_COMMUNITY): Payer: Self-pay | Admitting: *Deleted

## 2011-07-28 ENCOUNTER — Emergency Department (EMERGENCY_DEPARTMENT_HOSPITAL)
Admission: EM | Admit: 2011-07-28 | Discharge: 2011-07-30 | Disposition: A | Discharge status: Other Acute Inpatient Hospital | Payer: Medicaid Other | Source: Home / Self Care | Attending: Emergency Medicine | Admitting: Emergency Medicine

## 2011-07-28 DIAGNOSIS — F29 Unspecified psychosis not due to a substance or known physiological condition: Secondary | ICD-10-CM

## 2011-07-28 DIAGNOSIS — F319 Bipolar disorder, unspecified: Secondary | ICD-10-CM | POA: Insufficient documentation

## 2011-07-28 DIAGNOSIS — I10 Essential (primary) hypertension: Secondary | ICD-10-CM | POA: Insufficient documentation

## 2011-07-28 DIAGNOSIS — F17200 Nicotine dependence, unspecified, uncomplicated: Secondary | ICD-10-CM | POA: Insufficient documentation

## 2011-07-28 DIAGNOSIS — F22 Delusional disorders: Secondary | ICD-10-CM | POA: Insufficient documentation

## 2011-07-28 LAB — COMPREHENSIVE METABOLIC PANEL
Alkaline Phosphatase: 74 U/L (ref 39–117)
BUN: 6 mg/dL (ref 6–23)
Calcium: 9.7 mg/dL (ref 8.4–10.5)
Creatinine, Ser: 0.75 mg/dL (ref 0.50–1.10)
GFR calc Af Amer: >90 mL/min (ref >90)
Glucose, Bld: 89 mg/dL (ref 70–99)
Potassium: 3.2 mEq/L — ABNORMAL LOW (ref 3.5–5.1)
Total Protein: 8.8 g/dL — ABNORMAL HIGH (ref 6.0–8.3)

## 2011-07-28 LAB — CBC
HCT: 36.8 % (ref 36.0–46.0)
Hemoglobin: 11.9 g/dL — ABNORMAL LOW (ref 12.0–15.0)
MCH: 27.3 pg (ref 26.0–34.0)
MCHC: 32.3 g/dL (ref 30.0–36.0)
MCV: 84.4 fL (ref 78.0–100.0)
RBC: 4.36 MIL/uL (ref 3.87–5.11)

## 2011-07-28 LAB — ETHANOL: Alcohol, Ethyl (B): <11 mg/dL (ref 0–11)

## 2011-07-28 LAB — RAPID URINE DRUG SCREEN, HOSP PERFORMED
Cocaine: NOT DETECTED
Opiates: NOT DETECTED

## 2011-07-28 LAB — URINALYSIS, ROUTINE W REFLEX MICROSCOPIC
Leukocytes, UA: NEGATIVE
Nitrite: NEGATIVE
Specific Gravity, Urine: 1.022 (ref 1.005–1.030)
Urobilinogen, UA: 1 mg/dL (ref 0.0–1.0)

## 2011-07-28 LAB — DIFFERENTIAL
Basophils Relative: 0 % (ref 0–1)
Eosinophils Absolute: 0 10*3/uL (ref 0.0–0.7)
Eosinophils Relative: 0 % (ref 0–5)
Lymphs Abs: 4.3 10*3/uL — ABNORMAL HIGH (ref 0.7–4.0)
Monocytes Absolute: 0.6 10*3/uL (ref 0.1–1.0)
Monocytes Relative: 5 % (ref 3–12)

## 2011-07-28 LAB — URINE MICROSCOPIC-ADD ON

## 2011-07-28 MED ORDER — ZIPRASIDONE MESYLATE 20 MG IM SOLR
10.0000 mg | Freq: Once | INTRAMUSCULAR | Status: AC
  Administered 2011-07-28: 10 mg via INTRAMUSCULAR

## 2011-07-28 MED ORDER — ZIPRASIDONE MESYLATE 20 MG IM SOLR
INTRAMUSCULAR | Status: AC
  Administered 2011-07-28: 10 mg via INTRAMUSCULAR
  Filled 2011-07-28: qty 20

## 2011-07-28 NOTE — ED Notes (Signed)
Pt refuses to change into scrubs, is handcuffed & [employer] remains @ side.

## 2011-07-28 NOTE — ED Provider Notes (Signed)
History     CSN: [id number]  Arrival date & time 07/28/11  1524   First MD Initiated Contact with Patient 07/28/11 1720      Chief Complaint  Patient presents with  . V70.1    (Consider location/radiation/quality/duration/timing/severity/associated sxs/prior treatment) The history is provided by the patient and the police.   the patient is a 41 year old, female, with a history of depression, and bipolar disorder, who is brought to the emergency department for psychiatric evaluation with IVC.  Orders.  She states that her brother went to the magistrate to complete.  IVC papers saying that she was not taking her medications that she refuses to speak to people and there is concern that she may be harmful to herself.  The patient is in no distress.  She denies any symptoms or recent illnesses.  She denies suicidal or homicidal deviation to.  She denies drug or alcohol use.  She appears to have insight into the reason that she is here she is pleasant and cooperative.  The police say that she did not resist or cause any problems.  When they picked her up to bring her to the emergency department  Past Medical History  Diagnosis Date  . Depression   . Bipolar 1 disorder   . Hypertension   . Hemorrhoid   . Transfusion history     Past Surgical History  Procedure Date  . Cesarean section     Family History  Problem Relation Age of Onset  . Diabetes type II Other     History  Substance Use Topics  . Smoking status: Current Everyday Smoker -- 0.5 packs/day for 13 years  . Smokeless tobacco: Not on file  . Alcohol Use: No    OB History    Grav Para Term Preterm Abortions TAB SAB Ect Mult Living                  Review of Systems  Constitutional: Negative for fever.  Respiratory: Negative for cough and shortness of breath.   Gastrointestinal: Negative for nausea and vomiting.  Genitourinary: Negative for dysuria.  Neurological: Negative for headaches.    Psychiatric/Behavioral: Negative for suicidal ideas, hallucinations, behavioral problems, confusion, self-injury, dysphoric mood, decreased concentration and agitation. The patient is not hyperactive.   All other systems reviewed and are negative.    Allergies  Flagyl and Sulfa antibiotics  Home Medications  No current outpatient prescriptions on file.  BP 135/106  Pulse 102  Temp(Src) 98.7 F (37.1 C) (Oral)  Resp 16  SpO2 99%  Physical Exam  Vitals reviewed. Constitutional: She is oriented to person, place, and time. She appears well-developed and well-nourished.  HENT:  Head: Normocephalic and atraumatic.  Eyes: Pupils are equal, round, and reactive to light.  Neck: Normal range of motion.  Cardiovascular: Normal rate, regular rhythm and normal heart sounds.   No murmur heard. Pulmonary/Chest: Effort normal and breath sounds normal. No respiratory distress. She has no wheezes. She has no rales.  Abdominal: She exhibits no distension.  Musculoskeletal: Normal range of motion. She exhibits no edema and no tenderness.  Neurological: She is alert and oriented to person, place, and time. No cranial nerve deficit.  Skin: Skin is warm and dry. No rash noted. No erythema.  Psychiatric: She has a normal mood and affect. Her behavior is normal. Judgment and thought content normal.    ED Course  Procedures (including critical care time) 41 year old patient with history of depression, bipolar disorder was brought in  with IVC papers.  She does not appear to be psychotic, intoxicated with drugs or alcohol delirious or have any other findings that suggest that she is a threat to herself or others.  I will get screening laboratory testing, and consult psychiatric service for their evaluation, as well.  However, I think that the IVC papers can be canceled  Labs Reviewed  CBC - Abnormal; Notable for the following:    WBC 11.2 (*)    Hemoglobin 11.9 (*)    All other components within  normal limits  DIFFERENTIAL - Abnormal; Notable for the following:    Lymphs Abs 4.3 (*)    All other components within normal limits  COMPREHENSIVE METABOLIC PANEL - Abnormal; Notable for the following:    Potassium 3.2 (*)    Total Protein 8.8 (*)    All other components within normal limits  URINE RAPID DRUG SCREEN (HOSP PERFORMED)  ETHANOL  URINALYSIS, ROUTINE W REFLEX MICROSCOPIC  PREGNANCY, URINE   No results found.   No diagnosis found.  8:35 PM Pt was seen by psychiatrist who feels pt needs admission for paranoid delusions.   9:23 PM Pt now hostile and agitated. Will give geodon.  MDM  paranoia        Cheri Guppy, MD 07/28/11 2124

## 2011-07-28 NOTE — ED Notes (Signed)
Pt is very upset. Pt brought in by [employer]. Pt states she is going to sue the hospital.

## 2011-07-28 NOTE — ED Notes (Signed)
Paper work faxed to telepsych consult

## 2011-07-28 NOTE — ED Notes (Signed)
Pt refused Vital Signs.

## 2011-07-28 NOTE — ED Notes (Signed)
Report given to TCU rn, pt transported

## 2011-07-28 NOTE — ED Notes (Addendum)
pt reports that her brother (who has mental illness) took out IVC papers on her. she reports one cop earlier reported that he knew her brother and knew he had mental illness. it was explained to pt that a judge saw her brother as competent to take out papers  pt is very angry and hostile at the monent. pt states that she does not agree or accept any treatment and that she is doing everything unwillingily and will sue the hospital. pt did undress and allow blood and vital signs to be taken.   pt is unhancuffed and was told she can be allowed to be unhandcuffed as long as she cooperates.

## 2011-07-28 NOTE — ED Notes (Signed)
Telepsych attempted to be done but kept freezing up on Dr. Marland Kitchens connection. Patient pysch consult finished over the phone.

## 2011-07-28 NOTE — ED Notes (Signed)
Spoke with Dr. Weldon Inches & informed pt is uncooperative & has not changed into scrubs; per Dr. Weldon Inches, take pt to the back, [employer] remains @ BS.

## 2011-07-28 NOTE — ED Notes (Signed)
Pt brought in with ivc papers

## 2011-07-29 MED ORDER — ZIPRASIDONE MESYLATE 20 MG IM SOLR
10.0000 mg | Freq: Four times a day (QID) | INTRAMUSCULAR | Status: DC | PRN
  Administered 2011-07-29: 10 mg via INTRAMUSCULAR
  Filled 2011-07-29: qty 20

## 2011-07-29 MED ORDER — LORAZEPAM 2 MG/ML IJ SOLN
1.0000 mg | Freq: Four times a day (QID) | INTRAMUSCULAR | Status: DC | PRN
  Administered 2011-07-30: 1 mg via INTRAMUSCULAR
  Filled 2011-07-29: qty 1

## 2011-07-29 NOTE — ED Notes (Signed)
Sitter at bedside.

## 2011-07-29 NOTE — ED Notes (Signed)
Social work Product manager in to see patient

## 2011-07-29 NOTE — ED Notes (Signed)
Patient taking a shower, linens changed

## 2011-07-29 NOTE — ED Notes (Signed)
Pt at desk using phone, no distress noted, pt remains calm and cooperative. Given crackers as requested.

## 2011-07-29 NOTE — ED Notes (Signed)
MD at bedside. 

## 2011-07-29 NOTE — ED Notes (Signed)
Pt attempted to leave, pt was stopped in hallway, refused to return to room, pt escorted to room by police and security, pt given geodon as ordered for agitation.

## 2011-07-29 NOTE — BH Assessment (Signed)
Assessment Note   [name] is an 41 y.o. female who was bought to the [facility] ED by [employer]. Pt's brother initiated IVC papers, which state patient is not communicating and has a hx of bipolar and depression. In addition, pt's brother reports pt has not been taking medications.  Writer and 3 other assessment staff members attempted to complete assessment, and patient was uncooperative with each assessor - refusing to answer questions or make eye contact despite repeated efforts.  Axis I: Mood Disorder NOS Axis II: Deferred Axis III:  Past Medical History  Diagnosis Date  . Depression   . Bipolar 1 disorder   . Hypertension   . Hemorrhoid   . Transfusion history    Axis IV: other psychosocial or environmental problems and problems with primary support group Axis V: 0 - Unable to assess  Past Medical History:  Past Medical History  Diagnosis Date  . Depression   . Bipolar 1 disorder   . Hypertension   . Hemorrhoid   . Transfusion history     Past Surgical History  Procedure Date  . Cesarean section     Family History:  Family History  Problem Relation Age of Onset  . Diabetes type II Other     Social History:  reports that she has been smoking.  She does not have any smokeless tobacco history on file. She reports that she does not drink alcohol or use illicit drugs.  Additional Social History:    Allergies:  Allergies  Allergen Reactions  . Flagyl (Metronidazole Hcl) Nausea Only  . Sulfa Antibiotics     nausea    Home Medications:  Medications Prior to Admission  Medication Dose Route Frequency Provider Last Rate Last Dose  . LORazepam (ATIVAN) injection 1 mg  1 mg Intramuscular Q6H PRN Loren Racer, MD      . ziprasidone (GEODON) injection 10 mg  10 mg Intramuscular Once Cheri Guppy, MD   10 mg at 07/28/11 2132  . ziprasidone (GEODON) injection 10 mg  10 mg Intramuscular Q6H PRN Loren Racer, MD       No current outpatient prescriptions on file as of  07/28/2011.    OB/GYN Status:  No LMP recorded.  General Assessment Data Location of [facility]: [facility] Living Arrangements: Other (Comment) (Unknown) Can pt return to current living arrangement?: Yes Admission Status: Involuntary (Brother initiated IVC) Is patient capable of signing voluntary admission?: No Transfer from: Acute Hospital Referral Source: Self/Family/Friend  Education Status Is patient currently in school?: No  Risk to self Suicidal Ideation: No-Not Currently/Within Last 6 Months (Unknown) Suicidal Intent: No-Not Currently/Within Last 6 Months (Unknown) Is patient at risk for suicide?: No (Unknown) Suicidal Plan?: No Access to Means: No What has been your use of drugs/alcohol within the last 12 months?: Unknown Previous Attempts/Gestures: No (Unknown) How many times?:  (Unknown) Other Self Harm Risks:  (Unknown) Triggers for Past Attempts: Unknown Intentional Self Injurious Behavior: None Family Suicide History: Unknown Recent stressful life event(s): Other (Comment) (Unknown) Persecutory voices/beliefs?: No Depression: No (Unknown) Depression Symptoms:  (Unknown) Substance abuse history and/or treatment for substance abuse?: No Suicide prevention information given to non-admitted patients: Not applicable  Risk to Others Homicidal Ideation: No Thoughts of Harm to Others: No Current Homicidal Intent: No Current Homicidal Plan: No Access to Homicidal Means: No History of harm to others?: No Assessment of Violence: None Noted Does patient have access to weapons?: No Criminal Charges Pending?: No Does patient have a court date: No  Psychosis Hallucinations: None noted Delusions: None noted  Mental Status Report Appear/Hygiene: Disheveled Eye Contact: Poor Motor Activity: Unable to assess Speech: Unable to assess;Elective mutism Level of Consciousness: Unable to assess Mood: Other (Comment);Preoccupied (Unable to assess) Affect: Unable to  Assess Anxiety Level: None Thought Processes:  (Unable to assess) Judgement: Impaired Orientation: Unable to assess Obsessive Compulsive Thoughts/Behaviors: None  Cognitive Functioning Concentration:  (Unknown) Memory:  (Unable to assess) IQ: Average Insight: Poor Impulse Control: Poor Appetite: Fair Sleep: No Change Vegetative Symptoms: None  Prior Inpatient Therapy Prior Inpatient Therapy: Yes Prior Therapy Dates: Unknown Prior Therapy Facilty/Provider(s): Unknown Reason for Treatment: Unknown  Prior Outpatient Therapy Prior Outpatient Therapy: No Prior Therapy Dates: Unknown Prior Therapy Facilty/Provider(s): Unknown Reason for Treatment: Unknown            Values / Beliefs Cultural Requests During Hospitalization: None Spiritual Requests During Hospitalization: None        Additional Information 1:1 In Past 12 Months?: No CIRT Risk: No Elopement Risk: No Does patient have medical clearance?: Yes     Disposition:  Disposition Disposition of Patient: Inpatient treatment program Type of inpatient treatment program: Adult  On Site Evaluation by:   Reviewed with Physician:     Marlaine Hind ANN S 07/29/2011 2:45 PM

## 2011-07-29 NOTE — ED Provider Notes (Signed)
BP 109/66  Pulse 94  Temp(Src) 97.8 F (36.6 C) (Oral)  Resp 20  SpO2 98% Pt resting comfortably. Pt required Geodon last night for agitation. IVC and psychiatrist recommends inpatient treatment.   Loren Racer, MD 07/29/11 [phone]

## 2011-07-29 NOTE — ED Notes (Signed)
Social worker to bedside, sitter at bedside, ABC's intact

## 2011-07-29 NOTE — ED Notes (Signed)
Pt laying in bed watching TV, pt will not answer questions or make eye contact, pt does not respond when asked about how she is feeling or if patient is able to ambulate. Sitter at bedside.

## 2011-07-29 NOTE — ED Notes (Signed)
Pt calmer, resting in bed with eyes closed, respirations even and non-labored, no distress noted

## 2011-07-29 NOTE — ED Notes (Signed)
Pt refusing vital signs at this time, will try again soon.

## 2011-07-29 NOTE — ED Notes (Addendum)
Patient yelling and screaming across er at the Dr. Weldon Inches. Patient asked to calm down and refused. New orders received.

## 2011-07-30 ENCOUNTER — Inpatient Hospital Stay (HOSPITAL_COMMUNITY)
Admission: AD | Admit: 2011-07-30 | Discharge: 2011-08-06 | DRG: 885 | Disposition: A | Discharge status: Home / Self Care | Payer: Medicaid Other | Source: Ambulatory Visit | Attending: Psychiatry | Admitting: Psychiatry

## 2011-07-30 DIAGNOSIS — F319 Bipolar disorder, unspecified: Secondary | ICD-10-CM

## 2011-07-30 DIAGNOSIS — F29 Unspecified psychosis not due to a substance or known physiological condition: Secondary | ICD-10-CM

## 2011-07-30 DIAGNOSIS — F22 Delusional disorders: Secondary | ICD-10-CM

## 2011-07-30 DIAGNOSIS — E876 Hypokalemia: Secondary | ICD-10-CM | POA: Diagnosis present

## 2011-07-30 DIAGNOSIS — F259 Schizoaffective disorder, unspecified: Principal | ICD-10-CM | POA: Diagnosis present

## 2011-07-30 DIAGNOSIS — K649 Unspecified hemorrhoids: Secondary | ICD-10-CM | POA: Diagnosis present

## 2011-07-30 DIAGNOSIS — I10 Essential (primary) hypertension: Secondary | ICD-10-CM | POA: Diagnosis present

## 2011-07-30 DIAGNOSIS — F17200 Nicotine dependence, unspecified, uncomplicated: Secondary | ICD-10-CM | POA: Diagnosis present

## 2011-07-30 DIAGNOSIS — F250 Schizoaffective disorder, bipolar type: Secondary | ICD-10-CM | POA: Diagnosis present

## 2011-07-30 MED ORDER — LORAZEPAM 2 MG/ML IJ SOLN: 1.0000 mg | Freq: Four times a day (QID) | INTRAMUSCULAR | Status: DC | PRN

## 2011-07-30 MED ORDER — ALUM & MAG HYDROXIDE-SIMETH 200-200-20 MG/5ML PO SUSP: 30.0000 mL | ORAL | Status: DC | PRN

## 2011-07-30 MED ORDER — HALOPERIDOL 5 MG PO TABS
5.0000 mg | ORAL_TABLET | Freq: Four times a day (QID) | ORAL | Status: DC | PRN
  Filled 2011-07-30: qty 1

## 2011-07-30 MED ORDER — LORAZEPAM 1 MG PO TABS: 1.0000 mg | ORAL_TABLET | Freq: Four times a day (QID) | ORAL | Status: DC | PRN

## 2011-07-30 MED ORDER — DIVALPROEX SODIUM 500 MG PO DR TAB
500.0000 mg | DELAYED_RELEASE_TABLET | Freq: Two times a day (BID) | ORAL | Status: DC
  Administered 2011-07-30: 500 mg via ORAL
  Filled 2011-07-30: qty 1

## 2011-07-30 MED ORDER — ACETAMINOPHEN 325 MG PO TABS: 650.0000 mg | ORAL_TABLET | Freq: Four times a day (QID) | ORAL | Status: DC | PRN

## 2011-07-30 MED ORDER — QUETIAPINE FUMARATE 200 MG PO TABS
200.0000 mg | ORAL_TABLET | Freq: Every day | ORAL | Status: DC
  Filled 2011-07-30 (×6): qty 1

## 2011-07-30 MED ORDER — HALOPERIDOL LACTATE 5 MG/ML IJ SOLN
5.0000 mg | Freq: Four times a day (QID) | INTRAMUSCULAR | Status: DC | PRN
  Filled 2011-07-30: qty 1

## 2011-07-30 MED ORDER — QUETIAPINE FUMARATE 100 MG PO TABS
100.0000 mg | ORAL_TABLET | Freq: Two times a day (BID) | ORAL | Status: DC
  Administered 2011-07-30: 100 mg via ORAL
  Filled 2011-07-30: qty 1

## 2011-07-30 MED ORDER — DIVALPROEX SODIUM ER 500 MG PO TB24
1000.0000 mg | ORAL_TABLET | Freq: Every day | ORAL | Status: DC
  Administered 2011-08-05: 1000 mg via ORAL
  Filled 2011-07-30 (×10): qty 2

## 2011-07-30 MED ORDER — TRAZODONE HCL 50 MG PO TABS: 150.0000 mg | ORAL_TABLET | Freq: Every evening | ORAL | Status: DC | PRN

## 2011-07-30 MED ORDER — POTASSIUM CHLORIDE CRYS ER 20 MEQ PO TBCR
40.0000 meq | EXTENDED_RELEASE_TABLET | Freq: Once | ORAL | Status: DC
  Filled 2011-07-30: qty 2

## 2011-07-30 MED ORDER — FERROUS SULFATE 325 (65 FE) MG PO TABS
325.0000 mg | ORAL_TABLET | Freq: Three times a day (TID) | ORAL | Status: DC
  Filled 2011-07-30 (×4): qty 1

## 2011-07-30 MED ORDER — MAGNESIUM HYDROXIDE 400 MG/5ML PO SUSP: 30.0000 mL | Freq: Every day | ORAL | Status: DC | PRN

## 2011-07-30 MED ORDER — NICOTINE 21 MG/24HR TD PT24
21.0000 mg | MEDICATED_PATCH | Freq: Every day | TRANSDERMAL | Status: DC
  Filled 2011-07-30 (×9): qty 1

## 2011-07-30 MED ORDER — POTASSIUM CHLORIDE CRYS ER 20 MEQ PO TBCR
20.0000 meq | EXTENDED_RELEASE_TABLET | Freq: Two times a day (BID) | ORAL | Status: AC
  Filled 2011-07-30 (×5): qty 1

## 2011-07-30 NOTE — BH Assessment (Signed)
Assessment Note   [name] is an 41 y.o. female. Initial Assessment:07-29-11:[name] is an 41 y.o. female who was bought to the [facility] ED by [employer]. Pt's brother initiated IVC papers, which state patient is not communicating and has a hx of bipolar and depression. In addition, pt's brother reports pt has not been taking medications. Writer and 3 other assessment staff members attempted to complete assessment, and patient was uncooperative with each assessor - refusing to answer questions or make eye contact despite repeated efforts.  Reassessment:07-30-11/ Pt continues to present with angry demeanor and as noted above most of the information has been obtained from nursing notes.(See above). Patient refused to answer questions. Assessor introduced self to patient informing her of her name,credentials,and reason for speaking with her. Assessor called pt's name and pt refused to respond, after calling the pt's name 3x pt turned to look at assessor. Assessor asked pt is their any reason why she is refusing to answer questions and pt responded "no", " i just aint". Pt also refusing to take po iron and potassium. Consulted with Dr. Milus Banister who reports that pt can't refuse meds if she is on ivc papers. I informed pt's nurse that she would need to speak with Dr. Karma Ganja about the administering medication protocol.  Pt accepted to [facility] for Inpatient treatment but will need to agree and take medication before being transferred to [facility] per [facility] who Spoke with [facility] physician about pt refusing to take medication. Pt is currently awaiting a 400 hall bed to become available before she can be transferred to [facility].   Results for orders placed during the hospital encounter of 07/28/11  URINE RAPID DRUG SCREEN (HOSP PERFORMED)      Component Value Range   Opiates NONE DETECTED  NONE DETECTED    Cocaine NONE DETECTED  NONE DETECTED    Benzodiazepines NONE DETECTED  NONE DETECTED    Amphetamines NONE DETECTED  NONE  DETECTED    Tetrahydrocannabinol NONE DETECTED  NONE DETECTED    Barbiturates NONE DETECTED  NONE DETECTED   CBC      Component Value Range   WBC 11.2 (*) 4.0 - 10.5 (K/uL)   RBC 4.36  3.87 - 5.11 (MIL/uL)   Hemoglobin 11.9 (*) 12.0 - 15.0 (g/dL)   HCT 16.1  09.6 - 04.5 (%)   MCV 84.4  78.0 - 100.0 (fL)   MCH 27.3  26.0 - 34.0 (pg)   MCHC 32.3  30.0 - 36.0 (g/dL)   RDW 40.9  81.1 - 91.4 (%)   Platelets 331  150 - 400 (K/uL)  DIFFERENTIAL      Component Value Range   Neutrophils Relative 55  43 - 77 (%)   Neutro Abs 6.2  1.7 - 7.7 (K/uL)   Lymphocytes Relative 39  12 - 46 (%)   Lymphs Abs 4.3 (*) 0.7 - 4.0 (K/uL)   Monocytes Relative 5  3 - 12 (%)   Monocytes Absolute 0.6  0.1 - 1.0 (K/uL)   Eosinophils Relative 0  0 - 5 (%)   Eosinophils Absolute 0.0  0.0 - 0.7 (K/uL)   Basophils Relative 0  0 - 1 (%)   Basophils Absolute 0.1  0.0 - 0.1 (K/uL)  COMPREHENSIVE METABOLIC PANEL      Component Value Range   Sodium 137  135 - 145 (mEq/L)   Potassium 3.2 (*) 3.5 - 5.1 (mEq/L)   Chloride 99  96 - 112 (mEq/L)   CO2 25  19 - 32 (mEq/L)   Glucose, Bld 89  70 - 99 (mg/dL)   BUN 6  6 - 23 (mg/dL)   Creatinine, Ser 1.61  0.50 - 1.10 (mg/dL)   Calcium 9.7  8.4 - 09.6 (mg/dL)   Total Protein 8.8 (*) 6.0 - 8.3 (g/dL)   Albumin 4.3  3.5 - 5.2 (g/dL)   AST 20  0 - 37 (U/L)   ALT 15  0 - 35 (U/L)   Alkaline Phosphatase 74  39 - 117 (U/L)   Total Bilirubin 0.3  0.3 - 1.2 (mg/dL)   GFR calc non Af Amer >90  >90 (mL/min)   GFR calc Af Amer >90  >90 (mL/min)  ETHANOL      Component Value Range   Alcohol, Ethyl (B) <11  0 - 11 (mg/dL)  URINALYSIS, ROUTINE W REFLEX MICROSCOPIC      Component Value Range   Color, Urine YELLOW  YELLOW    APPearance TURBID (*) CLEAR    Specific Gravity, Urine 1.022  1.005 - 1.030    pH 6.0  5.0 - 8.0    Glucose, UA NEGATIVE  NEGATIVE (mg/dL)   Hgb urine dipstick NEGATIVE  NEGATIVE    Bilirubin Urine SMALL (*) NEGATIVE    Ketones, ur TRACE (*) NEGATIVE  (mg/dL)   Protein, ur NEGATIVE  NEGATIVE (mg/dL)   Urobilinogen, UA 1.0  0.0 - 1.0 (mg/dL)   Nitrite NEGATIVE  NEGATIVE    Leukocytes, UA NEGATIVE  NEGATIVE   PREGNANCY, URINE      Component Value Range   Preg Test, Ur NEGATIVE  NEGATIVE   URINE MICROSCOPIC-ADD ON      Component Value Range   Squamous Epithelial / LPF RARE  RARE    Urine-Other AMORPHOUS URATES/PHOSPHATES        Axis I: Mood Disorder NOS Axis II: Deferred Axis III:  Past Medical History  Diagnosis Date  . Depression   . Bipolar 1 disorder   . Hypertension   . Hemorrhoid   . Transfusion history    Axis IV: economic problems, other psychosocial or environmental problems and problems with primary support group Axis V: 21-30 behavior considerably influenced by delusions or hallucinations OR serious impairment in judgment, communication OR inability to function in almost all areas  Past Medical History:  Past Medical History  Diagnosis Date  . Depression   . Bipolar 1 disorder   . Hypertension   . Hemorrhoid   . Transfusion history     Past Surgical History  Procedure Date  . Cesarean section     Family History:  Family History  Problem Relation Age of Onset  . Diabetes type II Other     Social History:  reports that she has been smoking.  She does not have any smokeless tobacco history on file. She reports that she does not drink alcohol or use illicit drugs.  Additional Social History:    Allergies:  Allergies  Allergen Reactions  . Flagyl (Metronidazole Hcl) Nausea Only  . Sulfa Antibiotics     nausea    Home Medications:  Medications Prior to Admission  Medication Dose Route Frequency Provider Last Rate Last Dose  . divalproex (DEPAKOTE) DR tablet 500 mg  500 mg Oral BID [name], MD   500 mg at 07/30/11 1222  . ferrous sulfate tablet 325 mg  325 mg Oral TID WC Ethelda Chick, MD      . LORazepam (ATIVAN) injection 1 mg  1 mg  Intramuscular Q6H PRN Loren Racer, MD       . potassium chloride SA (K-DUR,KLOR-CON) CR tablet 40 mEq  40 mEq Oral Once Ethelda Chick, MD      . QUEtiapine (SEROQUEL) tablet 100 mg  100 mg Oral BID [name], MD   100 mg at 07/30/11 1220  . ziprasidone (GEODON) injection 10 mg  10 mg Intramuscular Once Cheri Guppy, MD   10 mg at 07/28/11 2132  . ziprasidone (GEODON) injection 10 mg  10 mg Intramuscular Q6H PRN Loren Racer, MD   10 mg at 07/29/11 1515   No current outpatient prescriptions on file as of 07/28/2011.    OB/GYN Status:  No LMP recorded.  General Assessment Data [facility]: [facility] ACT Assessment: Yes Living Arrangements: Other (Comment) (Unknown) Can pt return to current living arrangement?: Yes Admission Status: Involuntary Is patient capable of signing voluntary admission?: No Transfer from: Acute Hospital Referral Source: Self/Family/Friend  Education Status Is patient currently in school?: No  Risk to self Suicidal Ideation: No-Not Currently/Within Last 6 Months (unknown) Suicidal Intent: No-Not Currently/Within Last 6 Months (unknown) Is patient at risk for suicide?: No (unknown) Suicidal Plan?: No (pt refuses to answer questions/unknown) Access to Means: No (pt refuses to say;unknown) What has been your use of drugs/alcohol within the last 12 months?: Unknown Previous Attempts/Gestures: No (Unknown) How many times?:  (Unknown) Other Self Harm Risks: Unknown Triggers for Past Attempts: Unknown Intentional Self Injurious Behavior: None (Unknown) Family Suicide History: Unknown Recent stressful life event(s): Other (Comment) (Unknown) Persecutory voices/beliefs?: No Depression: No Depression Symptoms:  (Unknown) Substance abuse history and/or treatment for substance abuse?: No Suicide prevention information given to non-admitted patients: Not applicable  Risk to Others Homicidal Ideation: No (unknown; pt refuses to talk ) Thoughts of Harm to Others: No  (Unknown) Current Homicidal Intent: No (unknown) Current Homicidal Plan: No (Unknown) Access to Homicidal Means: No History of harm to others?: No (unknown unable to answer questions) Assessment of Violence: None Noted Does patient have access to weapons?: No Criminal Charges Pending?: No Does patient have a court date: No  Psychosis Hallucinations: None noted Delusions: None noted  Mental Status Report Appear/Hygiene: Disheveled Eye Contact: Poor Motor Activity: Restlessness Speech: Elective mutism Level of Consciousness: Alert;Irritable Mood: Angry Affect: Angry Anxiety Level: None Thought Processes:  (unable to assess pt only responded verbally saying two words) Judgement: Impaired Orientation: Unable to assess Obsessive Compulsive Thoughts/Behaviors: None  Cognitive Functioning Concentration:  (Unknown) Memory:  (Unable to assess) IQ: Average Insight: Poor Impulse Control: Poor Appetite: Fair Sleep: No Change Vegetative Symptoms: None  Prior Inpatient Therapy Prior Inpatient Therapy: Yes Prior Therapy Dates: unknown Prior Therapy Facilty/Provider(s): unknown Reason for Treatment: unknown  Prior Outpatient Therapy Prior Outpatient Therapy: No Prior Therapy Dates: Unknown Prior Therapy Facilty/Provider(s): Unknown Reason for Treatment: Unknown            Values / Beliefs Cultural Requests During Hospitalization: None Spiritual Requests During Hospitalization: None        Additional Information 1:1 In Past 12 Months?: No CIRT Risk: No Elopement Risk: No Does patient have medical clearance?: Yes     Disposition:  Disposition Disposition of Patient: Inpatient treatment program Type of inpatient treatment program: Adult (pt accepted to [facility] and accepted by [facility] pending 400 hall b)  On Site Evaluation by:   Reviewed with Physician:     Bjorn Pippin 07/30/2011 2:49 PM

## 2011-07-30 NOTE — [facility] Counselor (Signed)
Pt bed available at [facility] bed 407-2, accepted by Serena Colonel assigned to Dr. Priscille Kluver. Pt will need to agree to take medication before she can be transferred to [facility].

## 2011-07-30 NOTE — Discharge Planning (Signed)
CSW called [facility] on pt's disposition who advised there are no female beds at this time.  Manson Passey Konya Fauble ANN S , MSW, LCSWA 07/30/2011  9:03 AM [phone]

## 2011-07-30 NOTE — ED Notes (Signed)
Offered patient K+ but patient has refused, will continue to encourage patient

## 2011-07-30 NOTE — ED Notes (Signed)
Patient refused to take the K+ medication, called MD, will given Ativan 1mg  to assist in getting patient to take the K+

## 2011-07-30 NOTE — ED Notes (Signed)
Pt refuses vital signs at this time.

## 2011-07-30 NOTE — ED Provider Notes (Signed)
Pt seen and evaluated in the psych ED, she is resting comfortably and voices no current complaints.  She is currently being referred [facility] per ACT team.  1:32 PM pt has been accepted at [facility]- they request her to receive potassium and iron supplementation prior to transfer- these have both been ordered.   Ethelda Chick, MD 07/30/11 1332

## 2011-07-30 NOTE — Consult Note (Signed)
Reason for Consult: Disorder and noncompliant with treatment. Referring Physician: Ranae Palms, MD  [name] is an 41 y.o. female.  HPI: This is a 41 years old African American married female came into the is seen on the emergency department with the involuntary commitment petition filed by her brother. Patient has been diagnosed with schizoaffective disorder and she has been noncompliant with the outpatient treatment and medication management. Patient has been depressed isolated withdrawn not responding to the people around her who. Patient was assessed telypsych, 07/28/2011 and and received recommendation of inpatient psychiatric hospitalization. Patient has been staying in her room without a participation on the unit. Patient requested she neutral talked with the her husband Mr. Melina Fiddler and also reported he was in the hospital for the health problems. Patient is highly guarded and not providing the more detailed information. Patient has been evaluated at the [facility] in the past. Patient reported she was taken medication Risperdal Seroquel and Depakote but not being compliant with it. Patient reported she feels her medications are making her more sick and her into coma and she is Paranoid about taking medication this time. Patient has no history of substance abuse, agitation and aggressive behavior. She is attending ADL's.    Past Medical History  Diagnosis Date  . Depression   . Bipolar 1 disorder   . Hypertension   . Hemorrhoid   . Transfusion history     Past Surgical History  Procedure Date  . Cesarean section     Family History  Problem Relation Age of Onset  . Diabetes type II Other     Social History:  reports that she has been smoking.  She does not have any smokeless tobacco history on file. She reports that she does not drink alcohol or use illicit drugs.  Allergies:  Allergies  Allergen Reactions  . Flagyl (Metronidazole Hcl) Nausea Only  . Sulfa  Antibiotics     nausea    Medications: I have reviewed the patient's current medications. Bipolar disorder with the most recent episode of depression  Results for orders placed during the hospital encounter of 07/28/11 (from the past 48 hour(s))  URINE RAPID DRUG SCREEN (HOSP PERFORMED)     Status: Normal   Collection Time   07/28/11  4:45 PM      Component Value Range Comment   Opiates NONE DETECTED  NONE DETECTED     Cocaine NONE DETECTED  NONE DETECTED     Benzodiazepines NONE DETECTED  NONE DETECTED     Amphetamines NONE DETECTED  NONE DETECTED     Tetrahydrocannabinol NONE DETECTED  NONE DETECTED     Barbiturates NONE DETECTED  NONE DETECTED    CBC     Status: Abnormal   Collection Time   07/28/11  4:55 PM      Component Value Range Comment   WBC 11.2 (*) 4.0 - 10.5 (K/uL)    RBC 4.36  3.87 - 5.11 (MIL/uL)    Hemoglobin 11.9 (*) 12.0 - 15.0 (g/dL)    HCT 16.1  09.6 - 04.5 (%)    MCV 84.4  78.0 - 100.0 (fL)    MCH 27.3  26.0 - 34.0 (pg)    MCHC 32.3  30.0 - 36.0 (g/dL)    RDW 40.9  81.1 - 91.4 (%)    Platelets 331  150 - 400 (K/uL)   DIFFERENTIAL     Status: Abnormal   Collection Time   07/28/11  4:55 PM  Component Value Range Comment   Neutrophils Relative 55  43 - 77 (%)    Neutro Abs 6.2  1.7 - 7.7 (K/uL)    Lymphocytes Relative 39  12 - 46 (%)    Lymphs Abs 4.3 (*) 0.7 - 4.0 (K/uL)    Monocytes Relative 5  3 - 12 (%)    Monocytes Absolute 0.6  0.1 - 1.0 (K/uL)    Eosinophils Relative 0  0 - 5 (%)    Eosinophils Absolute 0.0  0.0 - 0.7 (K/uL)    Basophils Relative 0  0 - 1 (%)    Basophils Absolute 0.1  0.0 - 0.1 (K/uL)   COMPREHENSIVE METABOLIC PANEL     Status: Abnormal   Collection Time   07/28/11  4:55 PM      Component Value Range Comment   Sodium 137  135 - 145 (mEq/L)    Potassium 3.2 (*) 3.5 - 5.1 (mEq/L)    Chloride 99  96 - 112 (mEq/L)    CO2 25  19 - 32 (mEq/L)    Glucose, Bld 89  70 - 99 (mg/dL)    BUN 6  6 - 23 (mg/dL)    Creatinine, Ser 4.69   0.50 - 1.10 (mg/dL)    Calcium 9.7  8.4 - 10.5 (mg/dL)    Total Protein 8.8 (*) 6.0 - 8.3 (g/dL)    Albumin 4.3  3.5 - 5.2 (g/dL)    AST 20  0 - 37 (U/L) NO VISIBLE HEMOLYSIS   ALT 15  0 - 35 (U/L)    Alkaline Phosphatase 74  39 - 117 (U/L)    Total Bilirubin 0.3  0.3 - 1.2 (mg/dL)    GFR calc non Af Amer >90  >90 (mL/min)    GFR calc Af Amer >90  >90 (mL/min)   ETHANOL     Status: Normal   Collection Time   07/28/11  4:55 PM      Component Value Range Comment   Alcohol, Ethyl (B) <11  0 - 11 (mg/dL)   URINALYSIS, ROUTINE W REFLEX MICROSCOPIC     Status: Abnormal   Collection Time   07/28/11  6:19 PM      Component Value Range Comment   Color, Urine YELLOW  YELLOW     APPearance TURBID (*) CLEAR     Specific Gravity, Urine 1.022  1.005 - 1.030     pH 6.0  5.0 - 8.0     Glucose, UA NEGATIVE  NEGATIVE (mg/dL)    Hgb urine dipstick NEGATIVE  NEGATIVE     Bilirubin Urine SMALL (*) NEGATIVE     Ketones, ur TRACE (*) NEGATIVE (mg/dL)    Protein, ur NEGATIVE  NEGATIVE (mg/dL)    Urobilinogen, UA 1.0  0.0 - 1.0 (mg/dL)    Nitrite NEGATIVE  NEGATIVE     Leukocytes, UA NEGATIVE  NEGATIVE    PREGNANCY, URINE     Status: Normal   Collection Time   07/28/11  6:19 PM      Component Value Range Comment   Preg Test, Ur NEGATIVE  NEGATIVE    URINE MICROSCOPIC-ADD ON     Status: Normal   Collection Time   07/28/11  6:19 PM      Component Value Range Comment   Squamous Epithelial / LPF RARE  RARE     Urine-Other AMORPHOUS URATES/PHOSPHATES       No results found.  Positive for bipolar, depression, mood swings and sleep disturbance Blood  pressure 97/64, pulse 64, temperature 98.4 F (36.9 C), temperature source Oral, resp. rate 20, SpO2 99.00%.   Assessment/Plan: Bipolar disorder, most recent episode is mixed Noncompliance with the treatment  Recommended starting medications Seroquel 100 mg 2 times a day and Depakote 500 mg 2 times a day, While Checking the home medication from the  pharmacy. Recommended inpatient psychiatric hospitalization for stabilization of for depressive mood and psychosis.    [name],JANARDHAHA R. 07/30/2011, 10:54 AM

## 2011-07-30 NOTE — ED Notes (Signed)
Did complete taking both K+ pills without resistence, taken w/juice

## 2011-07-31 NOTE — [facility] Suicide Risk Assessment (Signed)
Suicide Risk Assessment  Admission Assessment     Demographic factors:   66 female Current Mental Status:     Objective: Appearance: disheveled   Eye Contact:: Poor   Speech: Pressured   Volume: Increased   Mood: Dysphoric   Affect: Irritable, hostile   Thought Process: Coherent   Orientation: Oriented to person   Thought Content: Unable to evaluate   Suicidal Thoughts: No   Homicidal Thoughts: No   Memory: Unable to assess   Judgement: Other: unable to assess   Insight: poor   Psychomotor Activity: Normal   Concentration: Poor   Recall: Unable to evaluate   Akathisia: none   Handed:   AIMS (if indicated): 0, but will not permit full exam.   Assets: Housing   Sleep: Number of Hours: 6    Loss Factors:   unable to take care of her self, unable to tal much Historical Factors:   questionable med complaince Risk Reduction Factors:   none identified at this time  CLINICAL FACTORS:   Schizophrenia:   Paranoid or undifferentiated type  COGNITIVE FEATURES THAT CONTRIBUTE TO RISK:  Loss of executive function    SUICIDE RISK:   Mild:  Suicidal ideation of limited frequency, intensity, duration, and specificity.  There are no identifiable plans, no associated intent, mild dysphoria and related symptoms, good self-control (both objective and subjective assessment), few other risk factors, and identifiable protective factors, including available and accessible social support.  PLAN OF CARE:  Assessment:   AXIS I: Schizoaffective Disorder  AXIS II: No diagnosis  AXIS III: Mild Hypokalemia.  Past Medical History   Diagnosis  Date   .  Depression    .  Bipolar 1 disorder    .  Hypertension    .  Hemorrhoid    .  Transfusion history     AXIS IV: deferred  AXIS V: 30 Treatment Plan Summary:   Daily contact with patient to assess and evaluate symptoms and progress in treatment  Medication management   Wonda Cerise 07/31/2011, 5:03 PM
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
## 2011-07-31 NOTE — Progress Notes (Signed)
Patient ID: [name], female   DOB: [birth year], 41 y.o.   MRN: [id number]  [facility] Notes:  (Counselor/Nursing/MHT/Case Management/Adjunct)  07/31/2011 11 AM  Type of Therapy:  Group Therapy, Dance/Movement Therapy    Thomasena Edis, Tekeisha Hakim

## 2011-07-31 NOTE — Progress Notes (Signed)
Writer observed patient coming out of her room and spoke with her briefly, patient reported that she was fine and did not need to be here at the hospital. Writer informed patient of her scheduled hs medications and patient reported that she did not need any medicine and was not taking it. Patient denies having pain, -si/hi/a/v hall. Patient reported that she was tired of answering the same old questions. Writer informed patient that if she needed anything I would be here all night. Safety maintained on unit, will continue to monitor.

## 2011-07-31 NOTE — Progress Notes (Signed)
Pt is irritable and agitated  She requests staff to just leave her alone and let her sleep  She has refused medications and groups verbal support offered  Q 15 min checks  Pt safe at present

## 2011-07-31 NOTE — Progress Notes (Signed)
Pt has been in bed asleep. Writer has encouraged pt to get up and possibly watch tv so she will be able to sleep tonight and informed pt. of medications to be given. Writer asked if pt was hungry and she reported yes. Writer brought a chef salad to unit in the dayroom but pt. never got up to eat it even after MHT reminded her a few times. Pt did not take her scheduled hs medication and continues to rest asleep in her room. Safety maintained on unit will continue to monitor.

## 2011-07-31 NOTE — Significant Event (Signed)
Patient was admitted on 07/30/11, she refused to cooperate with the admission process. Attempted to complete admission process this morning, pt continued to refuse intake process.

## 2011-07-31 NOTE — Progress Notes (Signed)
Pt. awake and asked for something to drink. Writer went to hallway and spoke with pt. and introduced self as her nurse. Writer informed pt that I had tried to talk with her earlier in the night and had her chef salad but she continued to sleep. When asked if she was hungry pt reported that she was and she was given a chef salad which she ate 100% in the dayroom and returned to her bed to lie back down. When writer mentioned that she had medications pt did not respond yes or no to taking meds.. Will continue to monitor.

## 2011-08-01 DIAGNOSIS — F259 Schizoaffective disorder, unspecified: Principal | ICD-10-CM

## 2011-08-01 NOTE — Progress Notes (Signed)
Patient ID: [name], female   DOB: 1970/05/16, 41 y.o.   MRN: [id number]  [facility] Notes:  (Counselor/Nursing/MHT/Case Management/Adjunct)  08/01/2011 11 AM  Type of Therapy:  Group Therapy, Dance/Movement Therapy   Participation Level:  None  Participation Quality:  Inattentive  Affect:  Anxious  Cognitive:  Confused  Insight:  None  Engagement in Group:  None  Engagement in Therapy:  None  Modes of Intervention:  Clarification, Problem-solving, Role-play, Socialization and Support  Summary of Progress/Problems:   Pt. Attended group  and was alert; however; patient, was not able to give clear insight in group session.  Pt. Would inapprioately laugh out loud during therapy session.     Rhunette Croft

## 2011-08-01 NOTE — Progress Notes (Signed)
Writer observed patient sitting in the dayroom watching tv but no interaction with peers. Writer inquired about how her day had been and patient reported that it had been ok but it would be better if she was not here at the hospital. Patient asked why she was here and Clinical research associate explained that she is involuntary and that the Dr has to determine when they feel that she is ready to be discharged. Patient was not happy and reported that she didn't want to talk about it anymore. Writer informed patient of her hs medications and she reported that she didn't need any medicine again. Patient denies having pain, did not answer when asked about si/hia/v hall. Patient was informed that if she needed anything I would be here until am. Patient was a little more talkative today. Safety maintained on unit, will continue to monitor.

## 2011-08-01 NOTE — Progress Notes (Signed)
Patient ID: [name], female   DOB: 1970-09-12, 41 y.o.   MRN: [id number]  Pt. refused to answer intake/PSA questions on 07-31-11 and 08-01-11. three attempts were made.

## 2011-08-01 NOTE — Progress Notes (Signed)
Patient ID: [name], female   DOB: 03/22/1971, 41 y.o.   MRN: [id number]   Per pt she is sleeping well. Reluctant to talk in details and very guarded today. More calmer and talkative as compared with yesterday  Eating well. Thinks she is ready for discharge now. Thinks meds are helping but could not tell how on repeated efforts and she got upset when asked about it. No aggressive behavior so far.  Objective: Appearance: guarded  Eye Contact:: Poor   Speech: rapid  Volume: Increased   Mood: labile   Affect: Irritable  Thought Process: Coherent   Orientation: Oriented to person, place and sitation  Thought Content: Unable to evaluate   Suicidal Thoughts: No   Homicidal Thoughts: No   Memory: poor  Judgement: poor  Insight: poor   Psychomotor Activity: Normal   Concentration: Poor   Recall: fair  Akathisia: none    Assessment:  AXIS I: Schizoaffective Disorder  AXIS II: No diagnosis  AXIS III: Mild Hypokalemia.  Past Medical History   Diagnosis  Date   .  Depression    .  Bipolar 1 disorder    .  Hypertension    .  Hemorrhoid    .  Transfusion history    AXIS IV: deferred  AXIS V: 30    Treatment Plan Summary:    Daily contact with patient to assess and evaluate symptoms and progress in treatment  Continue current Medications

## 2011-08-01 NOTE — Progress Notes (Signed)
Patient ID: CHI WOODHAM, female   DOB: Nov 11, 1970, 41 y.o.   MRN: [id number] Pt. attended and participated in aftercare planning group. Pt. accepted information on suicide prevention, warning signs to look for with suicide and crisis line numbers to use. The pt. agreed to call crisis line numbers if having warning signs or having thoughts of suicide. Pt. listed their current anxiety level as low and depression as low.  Pt. accepted handout on mental health supports.

## 2011-08-02 DIAGNOSIS — F259 Schizoaffective disorder, unspecified: Secondary | ICD-10-CM

## 2011-08-02 MED ORDER — QUETIAPINE FUMARATE 400 MG PO TABS
400.0000 mg | ORAL_TABLET | Freq: Every day | ORAL | Status: DC
  Administered 2011-08-03 – 2011-08-05 (×2): 400 mg via ORAL
  Filled 2011-08-02 (×7): qty 1

## 2011-08-02 NOTE — Tx Team (Signed)
Interdisciplinary Treatment Plan Update (Adult)  Date:  08/02/2011  Time Reviewed:  10:15AM-11:00AM  Progress in Treatment: Attending groups:  Yes Participating in groups:    No, cannot be redirected Taking medication as prescribed:    No Tolerating medication:   N/A Family/Significant other contact made:  No Patient understands diagnosis:   No, poor insight and judgment Discussing patient identified problems/goals with staff:   No Medical problems stabilized or resolved:   Yes Denies suicidal/homicidal ideation:  Will not respond, mostly, but indicates there is no SI/HI Issues/concerns per patient self-inventory:   None Other:    New problem(s) identified: Yes, Describe:  patient is verbally aggressive and threatening in manner  Reason for Continuation of Hospitalization: Aggression Medication stabilization Other; describe Treatment compliance  Interventions implemented related to continuation of hospitalization:  Medication monitoring and adjustment, safety checks Q15 min., suicide risk assessment, group therapy, psychoeducation, collateral contact, aftercare planning, ongoing physician assessments, medication education  Additional comments:  Not applicable  Estimated length of stay:  7 days  Discharge Plan:  Patient is to be referred to [facility] high-priority list  New goal(s):  Not applicable  Review of initial/current patient goals per problem list:   1.  Goal(s):  Determine patient's baseline  Met:  No  Target date:  By Discharge   As evidenced by:  Unknown at this time  2.  Goal(s):  Ensure no physical aggression during hospital stay  Met:  No  Target date:  By Discharge   As evidenced by:  So far, none, just verbal aggression  3.  Goal(s):  Medication stabilization  Met:  No  Target date:  By Discharge   As evidenced by:  Patient is refusing all meds, and a 2nd opinion to be done re forced meds.  4.  Goal(s):  Reduce paranoia to  baseline.  Met:  No  Target date:  By Discharge   As evidenced by:  Appears very suspicious of everyone.  Attendees: Patient:  [name]  08/02/2011 10:15AM-11:00AM  Family:     Physician:  Dr. Harvie Heck Readling 08/02/2011 10:15AM-11:00AM  Nursing:   Tacy Learn, RN 08/02/2011 10:15AM -11:00AM   Case Manager:  Ambrose Mantle, LCSW 08/02/2011 10:15AM-11:00AM  Counselor:  Veto Kemps, MT-BC 08/02/2011 10:15AM-11:00AM  Other:    Neill Loft, RN 08/02/2011 10:15AM-11:00AM  Other:   [name], RN 08/02/2011 10:15AM-11:00am  Other:      Other:       Scribe for Treatment Team:   Sarina Ser, 08/02/2011, 10:15AM-11:15AM

## 2011-08-02 NOTE — Progress Notes (Signed)
Patient ID: [name], female   DOB: Mar 29, 1971, 41 y.o.   MRN: [id number] Close observation is ongoing for pt due to pt's hx of unpredictable hostility towards staff. Pt is lying awake in bed. Pt denies SI/HI and AVH. Pt forwards little to staff but is cooperative. Pt states that she is not attending groups because "she doesn't really find them helpful." Pt has no complaints and is aware that she must stay on the unit at all times. Pt environment is safe. Writer will continue to monitor.
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
## 2011-08-02 NOTE — Discharge Planning (Signed)
Met with patient in Aftercare Planning Group.   She declined talking to Case Manager in group, and asked for a private meeting.  After group, when this was offered, she declined going to her room to talk privately, but agreed to talk in the day room.  She answered questions with the shortest answers possible.  She lives alone, and needs a bus pass at discharge to get home.  She does not have a counselor, and refused to answer any questions about whether she has a doctor.  Patient became very upset, enraged even, at Case Manager at this point of the conversation.  She demanded to know why she is in the hospital since she does not take medication.  CM then asked her to explain why she is in the hospital, then.  She stated her brother suffers from a mental illness and had taken out commitment papers on her.  CM asked what happened when she was taken to see the doctor, and she started to escalate and become aggressive.  Patient loudly stated she does not have a doctor.  As CM proceeded with trying to explain that after someone takes out commitment papers on someone, that person is then taken to see a doctor for an evaluation, patient started yelling at [facility].  She demanded discharge today.  No known case management needs at this point.  Ambrose Mantle, LCSW 08/02/2011, 9:40 AM

## 2011-08-02 NOTE — Progress Notes (Signed)
Pt. Was in treatment team and was calm and coopertaive. She stated she was there to do whatever we needed her to do." Following team pt. Went to her room and remained there until lunch. While in treatment team she did respond approproiately. Effective 12:13 today pt did go on close opps. Presently she is in the cafeteria with the other pts. . She has been refusing all meds including potassium. MD aware.

## 2011-08-02 NOTE — Progress Notes (Signed)
[facility] Group Notes:  (Counselor/Nursing/MHT/Case Management/Adjunct)  08/02/2011 3:18 PM  Type of Therapy:  Group Therapy  Participation Level:  Did Not Attend   [name] 08/02/2011, 3:18 PM 

## 2011-08-02 NOTE — Progress Notes (Signed)
Patient ID: [name], female   DOB: 12-07-1970, 41 y.o.   MRN: [id number] Continuous observation is ongoing due to a hx of aggression towards staff. Pt is awake and active on the unit. Pt occasionally visits the day room but interacts little. Pt refuses to wear her wrist band. MHT is currently maintaining the wrist band on their observation clipboard. Pt refused to let phlebotomy draw labs this PM. Pt mood is irritable and her affect is agitated. Pt is spending much of her time in her room. Pt environment is safe and there are no hazards present. Writer will continue to monitor.
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
## 2011-08-02 NOTE — Progress Notes (Signed)
[facility] MD Progress Note  08/02/2011 6:34 PM  Diagnosis:  Axis I: Schizoaffective Disorder - Bipolar Type.   The patient was seen today and reports the following:   Sleep: The patient reports to sleeping well last night but according to staff the patient slept 4.75 hours. Appetite: The patient reports a good appetite.   Mild>(1-10) >Severe  Hopelessness (1-10): 0  Depression (1-10): 5  Anxiety (1-10): 2   Suicidal Ideation: The patient adamantly denies any suicidal ideations today.  Plan: No  Intent: No  Means: No   Homicidal Ideation: The patient adamantly denies any homicidal ideations today.  Plan: No  Intent: No.  Means: No   Eye Contact: Good.  General Appearance/Behavior: The patient was cooperative during the interview today. But was engaged in an altercation with another staff this morning prior to treatment team. Motor Behavior: wnl Speech: Appropriate in rate and volume with no pressuring of speech today.  Mental Status: Alert and Oriented x 3  Level of Consciousness: Alert  Mood: Appears moderately depressed.  Affect: Appears moderately constricted.  Anxiety: Mild anxiety reported today.  Thought Process: wnl.  Thought Content: The patient denies any auditory or visual hallucinations today. No delusional thoughts reported.  Perception: wnl.  Judgment: Fair to Poor.  Insight: Fair to Poor.  Cognition: Orientated to person, place and time.  Sleep:  Number of Hours: 4.75    Vital Signs:Blood pressure 116/71, pulse 90, temperature 98.4 F (36.9 C), temperature source Oral, resp. rate 18, height 5\' 4"  (1.626 m).  Current Medications: Current Facility-Administered Medications  Medication Dose Route Frequency Provider Last Rate Last Dose  . acetaminophen (TYLENOL) tablet 650 mg  650 mg Oral Q6H PRN Sanjuana Kava, NP      . alum & mag hydroxide-simeth (MAALOX/MYLANTA) 200-200-20 MG/5ML suspension 30 mL  30 mL Oral Q4H PRN Sanjuana Kava, NP      . divalproex (DEPAKOTE  ER) 24 hr tablet 1,000 mg  1,000 mg Oral QHS Curlene Labrum Kalii Chesmore, MD      . haloperidol (HALDOL) tablet 5 mg  5 mg Oral Q6H PRN Ronny Bacon, MD       Or  . haloperidol lactate (HALDOL) injection 5 mg  5 mg Intramuscular Q6H PRN Curlene Labrum Kripa Foskey, MD      . LORazepam (ATIVAN) injection 1 mg  1 mg Intramuscular Q6H PRN Ronny Bacon, MD       Or  . LORazepam (ATIVAN) tablet 1 mg  1 mg Oral Q6H PRN Curlene Labrum Gaylin Bulthuis, MD      . magnesium hydroxide (MILK OF MAGNESIA) suspension 30 mL  30 mL Oral Daily PRN Sanjuana Kava, NP      . nicotine (NICODERM CQ - dosed in mg/24 hours) patch 21 mg  21 mg Transdermal Q0600 Curlene Labrum Maleeha Halls, MD      . potassium chloride SA (K-DUR,KLOR-CON) CR tablet 20 mEq  20 mEq Oral BID Curlene Labrum Ryleeann Urquiza, MD      . QUEtiapine (SEROQUEL) tablet 400 mg  400 mg Oral QHS Curlene Labrum Lesly Pontarelli, MD      . traZODone (DESYREL) tablet 150 mg  150 mg Oral QHS PRN Sanjuana Kava, NP      . DISCONTD: QUEtiapine (SEROQUEL) tablet 200 mg  200 mg Oral QHS Ronny Bacon, MD       Lab Results: No results found for this or any previous visit (from the past 48 hour(s)).  Time was spent today discussing with  the patient her current symptoms.  The patient was seen just after having a verbal altercation with another female patient on the unit.  She was cooperative but had little insight into her responsibility related to the altercation.  The patient reported to sleeping well but according to staff she slept 4.75 hours last night.  The patient reports moderate depression but denies any auditory or visual hallucinations or delusional thinking.  She also denies any SI/HI.  After treatment team, it was brought to this Physician's attention that the patient was not taking any of her prescribed medications.  If the patient does not take medications today a second opinion for forced medications will be requested tomorrow.  Treatment Plan Summary:  1. Daily contact with patient to assess and evaluate  symptoms and progress in treatment  2. Medication management  3. The patient will deny suicidal ideations or homicidal ideations for 48 hours prior to discharge and have a depression and anxiety rating of 3 or less. The patient will also deny any auditory or visual hallucinations or delusional thinking or display any manic or hypomanic behaviors.  4. The patient will display no evidence of substance withdrawal at time of discharge.   Plan:  1. Will continue the patient's current medications as ordered.  2. Will increase the medication Seroquel to 400 mgs po qhs for mood stabilization and anger issues. 3. Laboratory studies reviewed.  4. Will order a repeat CMP, CBC with Diff, TSH, Free T3, Free T4 and serum Depakote Level for this evening. 5. Will continue to monitor.  6. Will consider transfer to [facility] due to behavioral issues.  Juhi Lagrange 08/02/2011, 6:34 PM

## 2011-08-02 NOTE — Progress Notes (Signed)
Referral for fast-track admission to [facility] was [facility]  Ambrose Mantle, LCSW 08/02/2011, 5:22 PM

## 2011-08-02 NOTE — Progress Notes (Addendum)
08/02/2011         Time: 0930      Group Topic/Focus: The focus of this group is on enhancing the patient's understanding of leisure, barriers to leisure, and the importance of engaging in positive leisure activities upon discharge for improved total health.  Participation Level: None  Participation Quality: Intrusive  Affect: Angry  Cognitive: Unknown  Additional Comments: Patient refused to participate in group, but refused to leave the dayroom. Patient began yelling at a peer when the peer asked her to hand her a book. Patient stood up and was very aggressive towards other patients and staff, continued to refuse to leave the dayroom. After the incident, patient was observed to be laughing inappropriately for the remainder of group. Patient maintains she doesn't need to be here at the hospital and will not cooperate with treatment.   Telly Broberg 08/02/2011 11:36 AM

## 2011-08-03 MED ORDER — LORAZEPAM 2 MG/ML IJ SOLN
1.0000 mg | Freq: Four times a day (QID) | INTRAMUSCULAR | Status: DC | PRN
  Administered 2011-08-03: 2 mg via INTRAMUSCULAR

## 2011-08-03 MED ORDER — HALOPERIDOL 5 MG PO TABS
10.0000 mg | ORAL_TABLET | Freq: Four times a day (QID) | ORAL | Status: DC | PRN
  Filled 2011-08-03: qty 1

## 2011-08-03 MED ORDER — HALOPERIDOL LACTATE 5 MG/ML IJ SOLN: 10.0000 mg | Freq: Four times a day (QID) | INTRAMUSCULAR | Status: DC | PRN

## 2011-08-03 MED ORDER — LORAZEPAM 2 MG/ML IJ SOLN
INTRAMUSCULAR | Status: AC
  Administered 2011-08-03: 2 mg via INTRAMUSCULAR
  Filled 2011-08-03: qty 1

## 2011-08-03 MED ORDER — HALOPERIDOL LACTATE 5 MG/ML IJ SOLN
5.0000 mg | Freq: Four times a day (QID) | INTRAMUSCULAR | Status: DC | PRN
  Administered 2011-08-03: 10 mg via INTRAMUSCULAR

## 2011-08-03 MED ORDER — HALOPERIDOL 5 MG PO TABS: 10.0000 mg | ORAL_TABLET | Freq: Four times a day (QID) | ORAL | Status: DC | PRN

## 2011-08-03 MED ORDER — LORAZEPAM 1 MG PO TABS
2.0000 mg | ORAL_TABLET | Freq: Four times a day (QID) | ORAL | Status: DC | PRN
  Filled 2011-08-03: qty 1

## 2011-08-03 MED ORDER — HALOPERIDOL LACTATE 5 MG/ML IJ SOLN
INTRAMUSCULAR | Status: AC
  Administered 2011-08-03: 10 mg via INTRAMUSCULAR
  Filled 2011-08-03: qty 1

## 2011-08-03 MED ORDER — LORAZEPAM 2 MG/ML IJ SOLN: 2.0000 mg | Freq: Four times a day (QID) | INTRAMUSCULAR | Status: DC | PRN

## 2011-08-03 MED ORDER — LORAZEPAM 1 MG PO TABS: 2.0000 mg | ORAL_TABLET | Freq: Four times a day (QID) | ORAL | Status: DC | PRN

## 2011-08-03 NOTE — Discharge Planning (Signed)
Did not see patient today, as she remained in her room.  Did follow-up with [facility] about the referral for a rapid admission there based on patient's aggression.  They have put her on the "short list" although not at the top of the list due to the aggression being verbal at this time.  We will await a bed.  Later in the day, more notes were available about an incident re patient being forced meds.  Case Manager sent these to [facility].  Utilization review done again.  Ambrose Mantle, LCSW 08/03/2011, 5:08 PM

## 2011-08-03 NOTE — Progress Notes (Signed)
Nursing Close Observation note: Currently resting quietly in bed with eyes closed. MHT sitting at pts's door with eye sight of pt per MD order. Respirations are even and unlabored. No acute pain/distress noted. Safety has been maintained with close observation. Will continue close observation and continue current POC.

## 2011-08-03 NOTE — Progress Notes (Signed)
Patient resting quietly on her bed.  No behavioral issues at this time.  Remains on one to one for safety.  Patient is safe on the unit at this time.

## 2011-08-03 NOTE — Progress Notes (Signed)
Patient resting quietly in bed.  Remains on one to one.  Patient is safe at this time and has not been disruptive in any way this morning.

## 2011-08-03 NOTE — Progress Notes (Signed)
Patient quiet in room.  Remains on one to one.

## 2011-08-03 NOTE — Progress Notes (Signed)
Patient angry at NP this morning and was using vulgar language to throw her out of the room.  Later in the day she was in the dayroom and was arguing that she should be allowed to leave and that we were holding her against her will.  [name] was attempting to explain to the patient that she was here under commitment and could not leave.  Patient was told that she would be here until she was released by the doctor or until the court released her.  She escalated again about the court date and not knowing she had a court date.  It was explained to her that all commitments came with a court date.  Dr. Allena Katz was called in.  Patient argued with him regarding her diagnosis.  He requested a second opinion.  Dr. Dan Humphreys went in and evaluated the patient and it was decided to do forced medication on the patient.  When she was told she was going to get force meds, she walked down to her room, pulled down her pants and sat on the edge of her bed.  There was a show of force in staff, but there was no need to lay hands on her.  She was cooperative with the injection and stayed in her room after it was done.

## 2011-08-03 NOTE — Progress Notes (Signed)
[facility] Group Notes:  (Counselor/Nursing/MHT/Case Management/Adjunct)  08/03/2011 2:22 PM  Type of Therapy:  Group Therapy  Participation Level:  Did Not Attend   [name] 08/03/2011, 2:22 PM

## 2011-08-03 NOTE — Progress Notes (Signed)
Patient ID: [name], female   DOB: January 20, 1971, 41 y.o.   MRN: [id number]  [name] is in the bed with the covers over her head, she wakens when I speak with her. She continues to be on close observation so has a Pensions consultant who is viewing her at all times, but is not within arms length. She must take on meals on the unit and is not allowed to go to the cafeteria.  She awakens after I speak to her for a while. I explained that she is under an involuntary petition and I wanted to hear her story about why she is here and why she is on medication. She remained silent for a while, then arouses and tells me to get out of the room and stop messing with her, she implies threats to me, cursing at me with expletives tells me had better get her discharge paperwork ready else. She has disrupted a home cost several staff members to come running down due to her screaming at me.  [name] continues to refuse all medications. She has not been physically aggressive.  Mental status exam: Fully alert female, hostile, irritable, in mood and affect. Speech is pressured, she is shouting and cursing at me, quite agitated, with. Threatening that I need to discharge her "or else. Refusing to give any information.  [facility] The emergency sedation is available. We'll continue close observation.

## 2011-08-03 NOTE — Progress Notes (Signed)
D. Met with patient, after being called to the 400 hall unit per patient request, demanding to staff to be released.  Patient was sitting in the day room and presented with blunted affect, irritable mood and continuously raising her voice stating "why yall still keeping me here".  Patient kept stating "I am not mentally ill and yall need to release me".  Attempted to explain to patient her court order for IVC but patient would not allow staff to explain and just kept yelling "yall do not have the right to keep me here against my rights".  Patient reported understanding what this RN was attempting to explain in regards to court order but would not accept why she was being held at [facility].  Attempted to discuss what brought her into the hospital and that her behaviors are also observed to determine her ongoing ability to care for her self and safety once released by her psychiatrist.  Patient stated "my brother is mentally ill and not me"  Stated "the police were called to my Mother's and they brought me to [facility] and then here against my will".  [name], Director of [facility] stepped in to have further discussion about why patient was being held and attempted to talk to patient about her treatment plan and again to explain legally why she is currently in custody.  Patient began yelling again "yall ain't gone be keeping me in here" and "I'm about to go off up in here".  A. Patient continued to go up and down from yelling to calm in her seat but demanding to be released and demanding to have a "doctor to get me the fuck out of her".  R. At that time, [name] met with Dr. Allena Katz and had him along with a show of force in staff meet with patient to explain her threatening behaviors, tone and actions were part of what was keeping patient at [facility].  Patient demanded to know her diagnosis and after yelling this 3 times was told by Dr. Allena Katz she as being treated for Schizoaffective Disorder Bipolar Type.  Patient yelled  "I am not and you cannot keep me here".  Patient kept arguing her point back and forth but then after more discussion agreed to take forced medications without incident as Dr. Allena Katz informed her this was ordered and the plan would be for her to go to [facility] once accepted.  Patient chose to walk to her room with show of force in staff and took Haldol 10mg  IM and Ativan 2mg  IM from her assigned nurse, Angela Cox Dax without incident.  Patient currently in her room and remains under close observation.  Patient still at threat to staff and other patients as earlier in the day was yelling at another patient and had to be redirected away from patient before altercation occurred.  Unit is currently overstaffed due to concern for further periods of deesculation and potential harm to patients and staff.  Will continue to evaluate and offer medications as currently patient refuses all meds unless forced.

## 2011-08-04 NOTE — Progress Notes (Signed)
[facility] MD Progress Note  08/04/2011 2:21 PM  Diagnosis:  Axis I: Schizoaffective Disorder - Bipolar Type.   The patient was seen today and reports the following:   Sleep: The patient reports to sleeping well last night. Appetite: The patient reports a good appetite.   Mild>(1-10) >Severe  Hopelessness (1-10): (Would Not Comment)  Depression (1-10): (Would Not Comment) Anxiety (1-10): (Would Not Comment)   Suicidal Ideation:  (Would Not Comment)  Plan: No  Intent: No  Means: No   Homicidal Ideation: (Would Not Comment)  Plan: No  Intent: No.  Means: No   Eye Contact: Poor.  General Appearance/Behavior: The patient was entirely uncooperative this morning and would not answer questions presented to her. Motor Behavior: wnl Speech: Appropriate in rate and volume with no pressuring of speech today.  Mental Status: Somnolent but likely Oriented x 3  Level of Consciousness: Somnolent  Mood: Appears moderately depressed.  Affect: Appears moderately constricted.  Anxiety: No anxiety noted today.  Thought Process: The patient appears angry with little reasoning capacity.  Thought Content: (Would Not Comment)  Perception: Likely paranoid thinking.  Judgment: Poor.  Insight: Poor.  Cognition: Orientated to person, place and time.  Sleep:  Number of Hours: 5.75    Vital Signs:Blood pressure 127/73, pulse 73, temperature 98.1 F (36.7 C), temperature source Oral, resp. rate 20, height 5\' 4"  (1.626 m).  Current Medications: Current Facility-Administered Medications  Medication Dose Route Frequency Provider Last Rate Last Dose  . acetaminophen (TYLENOL) tablet 650 mg  650 mg Oral Q6H PRN Sanjuana Kava, NP      . alum & mag hydroxide-simeth (MAALOX/MYLANTA) 200-200-20 MG/5ML suspension 30 mL  30 mL Oral Q4H PRN Sanjuana Kava, NP      . divalproex (DEPAKOTE ER) 24 hr tablet 1,000 mg  1,000 mg Oral QHS Curlene Labrum Nafeesah Lapaglia, MD      . haloperidol lactate (HALDOL) injection 10 mg  10 mg  Intramuscular Q6H PRN Ronny Bacon, MD       Or  . haloperidol (HALDOL) tablet 10 mg  10 mg Oral Q6H PRN Curlene Labrum Greg Cratty, MD      . LORazepam (ATIVAN) injection 2 mg  2 mg Intramuscular Q6H PRN Ronny Bacon, MD       Or  . LORazepam (ATIVAN) tablet 2 mg  2 mg Oral Q6H PRN Curlene Labrum Dabney Dever, MD      . magnesium hydroxide (MILK OF MAGNESIA) suspension 30 mL  30 mL Oral Daily PRN Sanjuana Kava, NP      . nicotine (NICODERM CQ - dosed in mg/24 hours) patch 21 mg  21 mg Transdermal Q0600 Curlene Labrum Darlina Mccaughey, MD      . QUEtiapine (SEROQUEL) tablet 400 mg  400 mg Oral QHS Curlene Labrum Scotty Pinder, MD   400 mg at 08/03/11 2152  . traZODone (DESYREL) tablet 150 mg  150 mg Oral QHS PRN Sanjuana Kava, NP      . DISCONTD: haloperidol (HALDOL) tablet 10 mg  10 mg Oral Q6H PRN Ronny Bacon, MD      . DISCONTD: haloperidol lactate (HALDOL) injection 10 mg  10 mg Intramuscular Q6H PRN Ronny Bacon, MD       Lab Results: No results found for this or any previous visit (from the past 48 hour(s)).  Time was spent today attempting to discuss with the patient her symptoms.  The patient was in bed with her eyes open.  The patient did state  that she was sleeping well and reported a good appetite.  She then turn her back to his provider and pulled the cover over her head and would not answer any other questions.  No further information could be obtained.   Treatment Plan Summary:  1. Daily contact with patient to assess and evaluate symptoms and progress in treatment  2. Medication management  3. The patient will deny suicidal ideations or homicidal ideations for 48 hours prior to discharge and have a depression and anxiety rating of 3 or less. The patient will also deny any auditory or visual hallucinations or delusional thinking or display any manic or hypomanic behaviors.  4. The patient will display no evidence of substance withdrawal at time of discharge.   Plan:  1. Will continue the patient's current  medications as ordered.  2. Laboratory studies reviewed but the patient is refusing to have laboratories drawn.   3. Will continue to monitor.  4. The patient is on the [facility] list due to behavioral issues and will be transferred once a bed is available.  Kalimah Capurro 08/04/2011, 2:21 PM

## 2011-08-04 NOTE — Progress Notes (Signed)
Pt.jhas remained in the bed this morningg,. Resting on her right side in no apparent distress. She continues to remain an close ops pt. While wake. Will  Continue to monitor closely. She has not attended any groups this am. And does appear calm asleep

## 2011-08-04 NOTE — Progress Notes (Signed)
[facility] Group Notes:  (Counselor/Nursing/MHT/Case Management/Adjunct)  08/04/2011 2:15 PM  Type of Therapy:  Psychoeducational Skills  Participation Level:  Did Not Attend     Veto Kemps 08/04/2011, 2:15 PM

## 2011-08-04 NOTE — Discharge Planning (Signed)
Case Manager contacted [facility] re bed possibility there for this high-risk patient.  There have been no female discharges, and they are at high acuity right now.  She is on the priority wait list, but they have no knowledge of when bed will come available.  Ambrose Mantle, LCSW 08/04/2011, 2:35 PM

## 2011-08-05 NOTE — Progress Notes (Signed)
Patient ID: [name], female   DOB: Jun 05, 1970, 41 y.o.   MRN: [id number] Pt denies SI/HI/AVH.  [name] reported on her self inventory that she slept well, appetite good, energy level normal, ability to pay attention good, no depression or hopelessness, no pain.  She stays in her room most of the time, no aggression noted.

## 2011-08-05 NOTE — Progress Notes (Signed)
Patient ID: [name], female   DOB: 1970-11-14, 41 y.o.   MRN: [id number] [name] presents fully alert today, calm, cooperative, and polite, in contrast to yesterday when she was resistant to interview. She is talkative and pleasant today. Says she is very concerned about being here because she feels that there is many things in her personal life which she should be tending to, and she is losing time here.   She needs to get moved out of the house which is in and find better living situation in the eventual hope of having her 3 children back with her. They are not currently with her. She also is concerned about how much money is in her bank account and she would like to be able to access her information and contact her bank.  In the distant past she was being cared for at [facility], but she is dissatisfied with the service she is getting there after all the changes. She said she had attended it when it was [facility] for many years. She reports that she was taking Seroquel at home and she feels that it worked well for her.  . She rates her depression as 0/10 of 1-10 scale if 10 is the worst symptoms. She reports her anxiety is a 10 out of 10 1-10 scale, been worried about her home situation and her finances. She denies any suicidal thoughts. She denies any homicidal thoughts. She denies any hopelessness.  Mental status exam: Fully alert female, good eye contact, speech is nonpressured. She is pleasant on approach with no evidence of guarding of paranoia. Thinking is goal-directed, logical, and thoughts are normally formed. Speech is fluent, nonpressured, with normal production in form. No evidence of dangerous thoughts. She is looking forward to being discharged and is asking about being discharged tomorrow. She is also obtaining housing and living again with her 3 children the oldest of which is 12. She says they can call her oldest son if we want to get feedback or hear from her family.  Plan:  Continue current plan, continue Seroquel 400 mg each bedtime.

## 2011-08-05 NOTE — Progress Notes (Signed)
Pt. Has been out of bed more, went to cafeteria for supper,was up in the dayroom interacting with staff & peers. Pt.asked for ativan @ 1957 but was told that the ativan & haldol had to be given together as a force med. Order if she refused her HS meds. HS meds were offered & pt. Stated that she would take them ,but she expressed that it seemed like a lot of medication. Pt. Looked briefly suspicious but agreed to take her HS meds a little early. Pt. Is currently @ [facility]. Pt. Is off close obs. & is on the 2 hour post 1-1documentation. Continues on 15 minute checks. Pt. Safety maintained.

## 2011-08-05 NOTE — Tx Team (Signed)
Interdisciplinary Treatment Plan Update (Adult)  Date:  08/05/2011  Time Reviewed:  10:15AM-11:00AM  Progress in Treatment: Attending groups:  No Participating in groups:    No Taking medication as prescribed:    No, is on forced med order Tolerating medication:   Yes, when it has been forced Family/Significant other contact made:  No Patient understands diagnosis:   No Discussing patient identified problems/goals with staff:   No Medical problems stabilized or resolved:   Yes Denies suicidal/homicidal ideation:  Will not comment Issues/concerns per patient self-inventory:   None Other:    New problem(s) identified: Yes, Describe:  Even though is near top of Priority Wait List at [facility], there is not a bed likely in the immediate future due to their current acuity.  Reason for Continuation of Hospitalization: Aggression Medication stabilization Other; describe paranoia  Interventions implemented related to continuation of hospitalization:  Medication monitoring and adjustment, safety checks Q15 min., suicide risk assessment, group therapy, psychoeducation, collateral contact, aftercare planning, ongoing physician assessments, medication education  Additional comments:  Not applicable  Estimated length of stay:  4-6 days  Discharge Plan:  Hopefully to [facility]  New goal(s):  Not applicable  Review of initial/current patient goals per problem list:   1.  Goal(s):  Determine patient's baseline  Met:  No  Target date:  By Discharge   As evidenced by:  We need contact with family for this.  2.  Goal(s):  Ensure no physical aggression during hospital stay  Met:  No  Target date:  By Discharge   As evidenced by:  Has had to have show of force to take meds, has been threatening to staff, to doctor  3.  Goal(s):  Medication stabilization  Met:  No  Target date:  By Discharge   As evidenced by:  Patrici Ranks' take medications, so is not stabilized on current  meds  4.  Goal(s):  Reduce paranoia to baseline.  Met:  No  Target date:  By Discharge   As evidenced by:  Remains paranoid  Attendees: Patient:  Did not attend   Family:     Physician:  Dr. Harvie Heck Readling 08/05/2011 10:15AM-11:00AM  Nursing:   Nanine Means, RN 08/05/2011 10:15AM -11:00AM   Case Manager:  Ambrose Mantle, LCSW 08/05/2011 10:15AM-11:00AM  Counselor:  Veto Kemps, MT-BC 08/05/2011 10:15AM-11:00AM  Other:   Lynann Bologna, NP 08/05/2011 10:15AM-11:00AM  Other:      Other:      Other:       Scribe for Treatment Team:   Sarina Ser, 08/05/2011, 10:15AM-11:15AM

## 2011-08-05 NOTE — Progress Notes (Signed)
Patient ID: [name], female   DOB: April 25, 1970, 41 y.o.   MRN: [id number] Pt in her bed with her eyes closed, appears to be sleeping.

## 2011-08-05 NOTE — Progress Notes (Signed)
Patient ID: [name], female   DOB: [birth year], 41 y.o.   MRN: [id number] Pt denies SI/HI/AVH, much more talkative today, carried on a conversation with the nurse practitioner, took a shower, one-one continues.

## 2011-08-05 NOTE — Progress Notes (Addendum)
Pt denies SI, however pt is thought blocking. Pt selective about which questions she will and will not answer. Pt interaction is poor. Unable to fully access psychological status of pt due to uncooperativeness. Pt refused 2200 meds.

## 2011-08-05 NOTE — Discharge Planning (Signed)
Case Manager attempted to see patient, but she was sleeping.  [facility] and they reported that the acuity level and length of wait list remains the same, no changes foreseen in the near future.  Patient remains on the short wait list.  Nikie Cid, LCSW 08/05/2011, 10:02 AM

## 2011-08-06 MED ORDER — LORAZEPAM 0.5 MG PO TABS
0.5000 mg | ORAL_TABLET | Freq: Two times a day (BID) | ORAL | Status: DC | PRN
  Filled 2011-08-06: qty 14

## 2011-08-06 MED ORDER — QUETIAPINE FUMARATE 400 MG PO TABS
400.0000 mg | ORAL_TABLET | Freq: Every day | ORAL | Status: DC
  Filled 2011-08-06: qty 7

## 2011-08-06 MED ORDER — LORAZEPAM 0.5 MG PO TABS: 0.5000 mg | ORAL_TABLET | Freq: Two times a day (BID) | ORAL | Status: AC | PRN

## 2011-08-06 MED ORDER — LORAZEPAM 0.5 MG PO TABS: 0.5000 mg | ORAL_TABLET | Freq: Two times a day (BID) | ORAL | Status: DC

## 2011-08-06 MED ORDER — TRAZODONE HCL 50 MG PO TABS
150.0000 mg | ORAL_TABLET | Freq: Every evening | ORAL | Status: DC | PRN
  Filled 2011-08-06: qty 21

## 2011-08-06 MED ORDER — DIVALPROEX SODIUM ER 500 MG PO TB24: 1000.0000 mg | ORAL_TABLET | Freq: Every day | ORAL | Status: DC

## 2011-08-06 MED ORDER — DIVALPROEX SODIUM ER 500 MG PO TB24
1000.0000 mg | ORAL_TABLET | Freq: Every day | ORAL | Status: DC
  Filled 2011-08-06: qty 14

## 2011-08-06 MED ORDER — QUETIAPINE FUMARATE 400 MG PO TABS: 400.0000 mg | ORAL_TABLET | Freq: Every day | ORAL | Status: DC

## 2011-08-06 NOTE — Progress Notes (Signed)
Pt is in bed resting with eyes closed and does not appear to be in any distress   She did attend group this evening and seems to be less  Irritable  Will continue to monitor Q 15 min checks  Pt safe at present

## 2011-08-06 NOTE — Progress Notes (Signed)
[facility] Group Notes:  (Counselor/Nursing/MHT/Case Management/Adjunct)  08/06/2011 9:19 AM  Type of Therapy:  Group Therapy 08/05/11  Participation Level:  Did Not Attend   Veto Kemps 08/06/2011, 9:19 AM
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
## 2011-08-06 NOTE — Progress Notes (Signed)
[facility] Group Notes:  (Counselor/Nursing/MHT/Case Management/Adjunct)  08/06/2011 2:55 PM  Type of Therapy:  Group Therapy  Participation Level:  Active  Participation Quality:  Attentive and Sharing  Affect:  Blunted  Cognitive:  Oriented  Insight:  Limited  Engagement in Group:  Good  Engagement in Therapy:  Good  Modes of Intervention:  Education, Problem-solving and Support  Summary of Progress/Problems: Patient came to the group for the first time. She stated that she has  A lot to do when she gets home with moving. Plans to start right away, getting boxes, etc. Irritated with son because he can't come pick her up today, but she will take the bus home.  [name], Aram Beecham 08/06/2011, 2:55 PM

## 2011-08-06 NOTE — Tx Team (Signed)
Interdisciplinary Treatment Plan Update (Adult)  Date:  08/06/2011  Time Reviewed:  10:15AM-11:00AM  Progress in Treatment: Attending groups:  No Participating in groups:   No  Taking medication as prescribed:    Asked for her med yesterday, previously was having to be offered through "forced med" order Tolerating medication:   Yes, no side effects reported by patient or noted by staff Family/Significant other contact made: Not indicated as a need Patient understands diagnosis:   Yes, poor judgment Discussing patient identified problems/goals with staff:   Yes Medical problems stabilized or resolved:   Yes Denies suicidal/homicidal ideation:  Yes Issues/concerns per patient self-inventory:   None Other:    New problem(s) identified: Yes, Describe:  Expresses anxiety about her upcoming move situation; considers her stress level to be very high  Reason for Continuation of Hospitalization: None  Interventions implemented related to continuation of hospitalization:  Medication monitoring and adjustment, safety checks Q15 min., suicide risk assessment, group therapy, psychoeducation, collateral contact, aftercare planning, ongoing physician assessments, medication education - UNTIL DISCHARGE  Additional comments:  Doctor is going to prescribe Ativan BID to help with ongoing stress level.  Estimated length of stay:  Discharge today  Discharge Plan:  Go home with son transporting, start to pack stuff for move.  [facility] up at [facility].  Call [facility] for intake appointment if unhappy with [facility]  New goal(s):  Not applicable  Review of initial/current patient goals per problem list:   1.  Goal(s):  Determine patient's baseline  Met:  Yes  Target date:  By Discharge   As evidenced by:  Historical knowledge through long-time staff  2.  Goal(s):  Ensure no physical aggression during hospital stay  Met:  Yes  Target date:  By Discharge   As evidenced by:  Patient has  calmed and is now off the "Close Observation" order  3.  Goal(s):  Medication stabilization  Met:  Yes  Target date:  By Discharge   As evidenced by:  Stable, collaborated with doctor about what meds have worked to help her clear her thinking in the past  4.  Goal(s):  Reduce paranoia to baseline.  Met:  Yes  Target date:  By Discharge   As evidenced by:  Is at baseline per reports of people familiar with her.  Attendees: Patient:  [name]  08/06/2011 10:15AM-11:00AM  Family:     Physician:  Dr. Harvie Heck Readling 08/06/2011 10:15AM-11:00AM  Nursing:   Tacy Learn, RN 08/06/2011 10:15AM -11:00AM   Case Manager:  Ambrose Mantle, LCSW 08/06/2011 10:15AM-11:00AM  Counselor:  Veto Kemps, MT-BC 08/06/2011 10:15AM-11:00AM  Other:   Lynann Bologna, NP 08/06/2011 10:15AM-11:00AM  Other:   Everlene Balls, RN 08/06/2011 12:01 PM   Other:      Other:       Scribe for Treatment Team:   Sarina Ser, 08/06/2011, 12:01 PM

## 2011-08-06 NOTE — [facility] Suicide Risk Assessment (Signed)
Suicide Risk Assessment  Discharge Assessment     Demographic factors:   Current Mental Status Per Nursing Assessment::   On Admission:   At Discharge:  The patient was AO x 3 and was coherent and cooperative today.  She denied any significant depressive symptoms as well as any auditory or visual hallucinations or delusional thinking.  She also denies any SI/HI.  Current Mental Status Per Physician:  Diagnosis:  Axis I: Schizoaffective Disorder - Bipolar Type.   The patient was seen today and reports the following:   Sleep: The patient reports to sleeping very well last night.  Appetite: The patient continues to report a good appetite.   Mild>(1-10) >Severe  Hopelessness (1-10): 0  Depression (1-10): 0 Anxiety (1-10): 0   Suicidal Ideation: The patient adamantly denies any suicidal ideations today. Plan: No  Intent: No  Means: No   Homicidal Ideation: The patient adamantly denied any homicidal ideations today.  Plan: No  Intent: No.  Means: No   Eye Contact: wnl.  General Appearance/Behavior: The patient was alert and oriented x 3 and was friendly and cooperative with this provider. Motor Behavior: wnl  Speech: Appropriate in rate and volume with no pressuring of speech today.  Mental Status: Alert and Oriented x 3  Level of Consciousness: Alert.  Mood: Euthymic today.  Affect: Full.  Anxiety: No anxiety noted today.  Thought Process: wnl. Thought Content: The patient adamantly denies any auditory or visual hallucinations as well as any delusional thinking  Perception: wnl..  Judgment: Fair.  Insight: Fair.  Cognition: Orientated to person, place and time.   Loss Factors: The patient is in the process of moving.  Historical Factors: Long history of mental illness.  History of non-compliance with medications.  Risk Reduction Factors:   Good knowledge of community resources.  Access to healthcare.  Continued Clinical Symptoms:  Previous Psychiatric Diagnoses  and Treatments  Discharge Diagnoses:   AXIS I:   Schizoaffective Disorder - Bipolar Type. AXIS II:  Deferred.   AXIS III:   1.  Hypertension.   2.  Hemorrhoids. AXIS IV:   Chronic Mental Illness.  History of Non-compliance with medications.  Limited Primary Support System. AXIS V:   GAF at time of admission approximately 30.  GAF at time of discharge approximately 60.  Cognitive Features That Contribute To Risk:  None Noted.  Lab Results: No results found for this or any previous visit (from the past 48 hour(s)).   Time was spent today discussing with the patient her current symptoms.  The patient states that she is sleeping very well and reports a good appetite.  She denies any significant depressive symptoms as well as any SI/HI.  The patient also denies any auditory or visual hallucinations or delusional thinking.  The patient reports that her mood is level but with some brief episodes of anxiety which is helped with Ativan.  The patient states that she would like to be discharged today to follow up next week with [facility].  She reports that she is in the process of moving and needs to discharge to "attend to my business."  The patient acknowledged that she will remain on her medications after discharge and will keep her appointment at [facility].  Treatment Plan Summary:  1. Daily contact with patient to assess and evaluate symptoms and progress in treatment  2. Medication management  3. The patient will deny suicidal ideations or homicidal ideations for 48 hours prior to discharge and have a depression and  anxiety rating of 3 or less. The patient will also deny any auditory or visual hallucinations or delusional thinking or display any manic or hypomanic behaviors.  4. The patient will display no evidence of substance withdrawal at time of discharge.   Plan:  1. Will continue the patient's current medications as ordered.  2. Will continue to monitor.  3. The patient will be discharged  today to outpatient follow up with [facility] next week.  Suicide Risk:  Minimal: No identifiable suicidal ideation.  Patients presenting with no risk factors but with morbid ruminations; may be classified as minimal risk based on the severity of the depressive symptoms  Plan Of Care/Follow-up recommendations:  Activity:  As tolerated. Diet:  Heart Healthy Diet. Other:  Please take all medications only as directed and keep all scheduled follow up appointments.  Braelin Costlow 08/06/2011, 1:59 PM

## 2011-08-06 NOTE — Progress Notes (Signed)
Patient ID: [name], female   DOB: 04-Aug-1970, 41 y.o.   MRN: [id number] Pt discharged this afternoon, pt denies any SI/HI, pt provided with bus pass, pt provided with prescriptions and supply of medications, all belongings returned, discharge instructions provided and pt verbalized understanding

## 2011-08-06 NOTE — [facility] Counselor (Signed)
Adult Comprehensive Assessment  Patient ID: [name], female   DOB: 1970-06-03, 41 y.o.   MRN: [id number]  Information Source:    Current Stressors:     Living/Environment/Situation:     Family History:     Childhood History:     Education:     Employment/Work Situation:      Architect:      Alcohol/Substance Abuse:      Social Support System:      Leisure/Recreation:      Strengths/Needs:      Discharge Plan:      Summary/Recommendations:   Summary and Recommendations (to be completed by the evaluator): Patient refused to complete assessment. Patient refused contact with family.  Valdemar Mcclenahan, Aram Beecham. 08/06/2011

## 2011-08-06 NOTE — Progress Notes (Signed)
[facility] Case Management Discharge Plan:  Will you be returning to the same living situation after discharge: Yes,  returning to her home to pack and move At discharge, do you have transportation home?:Yes,  oldest son to transport Do you have the ability to pay for your medications:Yes,  insurance and income  Interagency Information:     Release of information consent forms completed and in the chart;  Patient's signature needed at discharge.  Patient to Follow up at:  Follow-up Information    Follow up with [facility] on 08/11/2011. [facility] is open Monday-Friday from 8AM-3PM)    Contact information:   [facility] [address]  [address] Telephone:  [phone]      Call [facility]. (If  you decide to leave [facility], call for an appointment here)    Contact information:   [address] [place], [place] [address] Telephone:  [phone]         Patient denies SI/HI:   Yes,      Safety Planning and Suicide Prevention discussed:  No.  Patient would not attend groups where this was discussed, was not suicidal at admission.  Barrier to discharge identified:No.  Summary and Recommendations:  Patient is going home, and although she does not like [facility] will try that agency, does not want referral elsewhere.  However, just in case she does not like [facility], have provided her the contact information for [facility].   [name], [name] 08/06/2011, 12:42 PM

## 2011-08-06 NOTE — Progress Notes (Signed)
Patient ID: [name], female   DOB: 28-Jul-1970, 41 y.o.   MRN: [id number] Attempts were made on 4/13, 4/14, 415, and 4/17 to complete Psychosocial assessment. Patient refused.

## 2011-08-11 NOTE — Progress Notes (Signed)
Patient Discharge Instructions:  Psychiatric Admission Assessment Note Provided,  08/10/2011 After Visit Summary (AVS) Provided,  08/10/2011 Face Sheet Provided, 08/10/2011 Faxed/Sent to the Next Level Care provider:  08/10/2011 Provided Suicide Risk Assessment - Discharge Assessment 08/10/2011  Faxed to [facility] @ [phone]  Wandra Scot, 08/11/2011, 4:34 PM

## 2011-09-03 ENCOUNTER — Emergency Department (HOSPITAL_COMMUNITY)
Admission: EM | Admit: 2011-09-03 | Discharge: 2011-09-06 | Disposition: A | Discharge status: Home / Self Care | Payer: Medicaid Other | Attending: Emergency Medicine | Admitting: Emergency Medicine

## 2011-09-03 ENCOUNTER — Encounter (HOSPITAL_COMMUNITY): Payer: Self-pay

## 2011-09-03 DIAGNOSIS — IMO0002 Reserved for concepts with insufficient information to code with codable children: Secondary | ICD-10-CM | POA: Insufficient documentation

## 2011-09-03 DIAGNOSIS — I10 Essential (primary) hypertension: Secondary | ICD-10-CM | POA: Insufficient documentation

## 2011-09-03 DIAGNOSIS — F319 Bipolar disorder, unspecified: Secondary | ICD-10-CM

## 2011-09-03 DIAGNOSIS — Z79899 Other long term (current) drug therapy: Secondary | ICD-10-CM | POA: Insufficient documentation

## 2011-09-03 DIAGNOSIS — F3130 Bipolar disorder, current episode depressed, mild or moderate severity, unspecified: Secondary | ICD-10-CM | POA: Insufficient documentation

## 2011-09-03 DIAGNOSIS — Z8659 Personal history of other mental and behavioral disorders: Secondary | ICD-10-CM | POA: Insufficient documentation

## 2011-09-03 HISTORY — DX: Schizophrenia, unspecified: F20.9

## 2011-09-03 LAB — DIFFERENTIAL
Basophils Relative: 0 % (ref 0–1)
Monocytes Relative: 7 % (ref 3–12)
Neutro Abs: 4.8 10*3/uL (ref 1.7–7.7)
Neutrophils Relative %: 56 % (ref 43–77)

## 2011-09-03 LAB — CBC
Hemoglobin: 10.7 g/dL — ABNORMAL LOW (ref 12.0–15.0)
MCHC: 31.7 g/dL (ref 30.0–36.0)
RBC: 4.03 MIL/uL (ref 3.87–5.11)
WBC: 8.5 10*3/uL (ref 4.0–10.5)

## 2011-09-03 LAB — BASIC METABOLIC PANEL
BUN: 10 mg/dL (ref 6–23)
Chloride: 99 mEq/L (ref 96–112)
GFR calc Af Amer: >90 mL/min (ref >90)
Potassium: 3.4 mEq/L — ABNORMAL LOW (ref 3.5–5.1)

## 2011-09-03 LAB — POCT PREGNANCY, URINE: Preg Test, Ur: NEGATIVE

## 2011-09-03 LAB — ETHANOL: Alcohol, Ethyl (B): <11 mg/dL (ref 0–11)

## 2011-09-03 LAB — RAPID URINE DRUG SCREEN, HOSP PERFORMED
Barbiturates: NOT DETECTED
Benzodiazepines: NOT DETECTED
Cocaine: NOT DETECTED

## 2011-09-03 MED ORDER — ZOLPIDEM TARTRATE 5 MG PO TABS: 5.0000 mg | ORAL_TABLET | Freq: Every evening | ORAL | Status: DC | PRN

## 2011-09-03 MED ORDER — LORAZEPAM 1 MG PO TABS: 1.0000 mg | ORAL_TABLET | Freq: Three times a day (TID) | ORAL | Status: DC | PRN

## 2011-09-03 MED ORDER — IBUPROFEN 200 MG PO TABS: 600.0000 mg | ORAL_TABLET | Freq: Three times a day (TID) | ORAL | Status: DC | PRN

## 2011-09-03 MED ORDER — ONDANSETRON HCL 4 MG PO TABS: 4.0000 mg | ORAL_TABLET | Freq: Three times a day (TID) | ORAL | Status: DC | PRN

## 2011-09-03 MED ORDER — ALUM & MAG HYDROXIDE-SIMETH 200-200-20 MG/5ML PO SUSP: 30.0000 mL | ORAL | Status: DC | PRN

## 2011-09-03 MED ORDER — NICOTINE 21 MG/24HR TD PT24
21.0000 mg | MEDICATED_PATCH | Freq: Every day | TRANSDERMAL | Status: DC
  Filled 2011-09-03: qty 1

## 2011-09-03 MED ORDER — ACETAMINOPHEN 325 MG PO TABS: 650.0000 mg | ORAL_TABLET | ORAL | Status: DC | PRN

## 2011-09-03 NOTE — ED Notes (Signed)
Patient has a history of schizophrenia and bipolar. Patient has IVC papers that were taken out by her mother. Patient was standing in the middle of the street and confused. Patient does not answer questions appropriately and stares frequently. Patient denied suicide, but stated she wanted to hurt somebody. Patient's mother reported to [employer] that she had been off of her medications.

## 2011-09-03 NOTE — ED Notes (Signed)
CSW attempted to complete [facility] assessment with pt. Pt refused stating "I'm not talking with anyone". Pt appeared agitated and stated she "is not going to answer questions". CSW explained that an assessment needed to be completed in order for the process to go further reminding pt that she is under IVC. Pt continues to refuse assessment. CSW/ACT will attempt to assess at a later time.

## 2011-09-03 NOTE — ED Provider Notes (Signed)
History     CSN: [id number]  Arrival date & time 09/03/11  1050   First MD Initiated Contact with Patient 09/03/11 1120      12:01 PM HPI Patient is level 5 caveat, due to behavioral disability Patient was brought here by [employer] with IVC paper work. Reports states that patient has a history of bipolar and schizophrenia. Mother was concerned that she has not been taking her medication. Reports neighbors have witnessed patient walking in the middle of the street. Patient is a very flat affect and does not answer any of my questions. Does not admit or deny suicidal ideation, homicidal ideation, hallucinations.  The history is provided by the patient. The history is limited by the condition of the patient.    Past Medical History  Diagnosis Date  . Depression   . Bipolar 1 disorder   . Hypertension   . Hemorrhoid   . Transfusion history   . Schizophrenia     Past Surgical History  Procedure Date  . Cesarean section     Family History  Problem Relation Age of Onset  . Diabetes type II Other     History  Substance Use Topics  . Smoking status: Current Everyday Smoker -- 0.5 packs/day for 13 years  . Smokeless tobacco: Not on file  . Alcohol Use: No    OB History    Grav Para Term Preterm Abortions TAB SAB Ect Mult Living                  Review of Systems  Unable to perform ROS: Psychiatric disorder    Allergies  Flagyl and Sulfa antibiotics  Home Medications   Current Outpatient Rx  Name Route Sig Dispense Refill  . DIVALPROEX SODIUM ER 500 MG PO TB24 Oral Take 2 tablets (1,000 mg total) by mouth at bedtime. For mood stabilization. 60 tablet 0  . QUETIAPINE FUMARATE 400 MG PO TABS Oral Take 1 tablet (400 mg total) by mouth at bedtime. For Psychosis and Mood stability. 30 tablet 0    BP 123/72  Pulse 82  Temp(Src) 98.2 F (36.8 C) (Oral)  Resp 24  SpO2 99%  Physical Exam  Vitals reviewed. Constitutional: She is oriented to person, place, and time. Vital  signs are normal. She appears well-developed and well-nourished. No distress.  HENT:  Head: Normocephalic and atraumatic.  Eyes: Pupils are equal, round, and reactive to light.  Neck: Neck supple.  Pulmonary/Chest: Effort normal.  Neurological: She is alert and oriented to person, place, and time.  Skin: Skin is warm and dry. No rash noted. No erythema. No pallor.  Psychiatric: Her speech is normal. She is agitated.       Patient has a very flat affect. Will not answer any of my questions. Stands up and paces the room when I leave    ED Course  Procedures  Results for orders placed during the hospital encounter of 09/03/11  CBC      Component Value Range   WBC 8.5  4.0 - 10.5 (K/uL)   RBC 4.03  3.87 - 5.11 (MIL/uL)   Hemoglobin 10.7 (*) 12.0 - 15.0 (g/dL)   HCT 40.9 (*) 81.1 - 46.0 (%)   MCV 83.9  78.0 - 100.0 (fL)   MCH 26.6  26.0 - 34.0 (pg)   MCHC 31.7  30.0 - 36.0 (g/dL)   RDW 91.4 (*) 78.2 - 15.5 (%)   Platelets 327  150 - 400 (K/uL)  DIFFERENTIAL  Component Value Range   Neutrophils Relative 56  43 - 77 (%)   Neutro Abs 4.8  1.7 - 7.7 (K/uL)   Lymphocytes Relative 36  12 - 46 (%)   Lymphs Abs 3.1  0.7 - 4.0 (K/uL)   Monocytes Relative 7  3 - 12 (%)   Monocytes Absolute 0.6  0.1 - 1.0 (K/uL)   Eosinophils Relative 1  0 - 5 (%)   Eosinophils Absolute 0.1  0.0 - 0.7 (K/uL)   Basophils Relative 0  0 - 1 (%)   Basophils Absolute 0.0  0.0 - 0.1 (K/uL)  BASIC METABOLIC PANEL      Component Value Range   Sodium 138  135 - 145 (mEq/L)   Potassium 3.4 (*) 3.5 - 5.1 (mEq/L)   Chloride 99  96 - 112 (mEq/L)   CO2 27  19 - 32 (mEq/L)   Glucose, Bld 94  70 - 99 (mg/dL)   BUN 10  6 - 23 (mg/dL)   Creatinine, Ser 4.09  0.50 - 1.10 (mg/dL)   Calcium 9.9  8.4 - 81.1 (mg/dL)   GFR calc non Af Amer 89 (*) >90 (mL/min)   GFR calc Af Amer >90  >90 (mL/min)  ETHANOL      Component Value Range   Alcohol, Ethyl (B) <11  0 - 11 (mg/dL)  URINE RAPID DRUG SCREEN (HOSP PERFORMED)       Component Value Range   Opiates NONE DETECTED  NONE DETECTED    Cocaine NONE DETECTED  NONE DETECTED    Benzodiazepines NONE DETECTED  NONE DETECTED    Amphetamines NONE DETECTED  NONE DETECTED    Tetrahydrocannabinol NONE DETECTED  NONE DETECTED    Barbiturates NONE DETECTED  NONE DETECTED   POCT PREGNANCY, URINE      Component Value Range   Preg Test, Ur NEGATIVE  NEGATIVE     MDM   1:33 PM Patient has IVC paperwork. Spoke with Clydie Braun From [facility]. She will come evaluate the patient      Thomasene Lot, Cordelia Poche 09/03/11 1827

## 2011-09-04 NOTE — ED Provider Notes (Signed)
IVC paperwork completed.  The patient continues to be confused - is resting comfortably in no apparent distress.  Voices no clinical concerns at this time.  Will await further recommendations from [facility] regarding the patient's disposition.  Medically clear for psychiatric evaluation.  RRR Lungs CTAB Abd soft, NT, ND  Labs reviewed and temp holding orders are placed.  Dayton Bailiff, MD 09/04/11 [phone]

## 2011-09-04 NOTE — ED Provider Notes (Signed)
Medical screening examination/treatment/procedure(s) were performed by non-physician practitioner and as supervising physician I was immediately available for consultation/collaboration.  Xyla Leisner, MD 09/04/11 0046 

## 2011-09-04 NOTE — BH Assessment (Signed)
Assessment Note   [name] is an 41 y.o. female brought to [facility] under IVC by her mother.  Pt was found standing in the middle of the street confused.  She currently knows that she's in the ER and her name, but is unclear about the situation.  She is slow to respond to questions and sometimes answers inappropriately or stops mid sentence to look around the room with darting eyes.  Halfway through the assessment, she stopped to ask me what I was doing in her room and if I had stolen something from her.  She could not answer whether she lived alone or if there were any children in her home and suggested that her visit to the ER had something to do with her brother in law.  [name] will benefit from an inpatient stay for crisis stabilization.  Axis I: Psychotic Disorder NOS Axis II: Deferred Axis III:  Past Medical History  Diagnosis Date  . Depression   . Bipolar 1 disorder   . Hypertension   . Hemorrhoid   . Transfusion history   . Schizophrenia    Axis IV: problems related to social environment Axis V: 11-20 some danger of hurting self or others possible OR occasionally fails to maintain minimal personal hygiene OR gross impairment in communication  Past Medical History:  Past Medical History  Diagnosis Date  . Depression   . Bipolar 1 disorder   . Hypertension   . Hemorrhoid   . Transfusion history   . Schizophrenia     Past Surgical History  Procedure Date  . Cesarean section     Family History:  Family History  Problem Relation Age of Onset  . Diabetes type II Other     Social History:  reports that she has been smoking.  She does not have any smokeless tobacco history on file. She reports that she does not drink alcohol or use illicit drugs.  Additional Social History:    Allergies:  Allergies  Allergen Reactions  . Flagyl (Metronidazole Hcl) Nausea Only  . Sulfa Antibiotics     nausea    Home Medications:  (Not in a hospital admission)  OB/GYN Status:   No LMP recorded.  General Assessment Data Location of Assessment: [facility] ED Living Arrangements: Other (Comment) (unknown-pt couldn't say) Can pt return to current living arrangement?: Yes Admission Status: Involuntary Is patient capable of signing voluntary admission?: No Transfer from: Acute Hospital Referral Source:  (gpd)  Education Status Is patient currently in school?: No  Risk to self Suicidal Ideation: No (Pt currently denies) Suicidal Intent: No (denies) Is patient at risk for suicide?: No Suicidal Plan?: No Access to Means: No What has been your use of drugs/alcohol within the last 12 months?: unknown Previous Attempts/Gestures: No Other Self Harm Risks: unknown Triggers for Past Attempts: Unknown Intentional Self Injurious Behavior:  (unknown) Family Suicide History: Unknown Recent stressful life event(s): Other (Comment) (unknown) Persecutory voices/beliefs?: No Depression: No Substance abuse history and/or treatment for substance abuse?: No Suicide prevention information given to non-admitted patients: Not applicable  Risk to Others Homicidal Ideation: No Thoughts of Harm to Others: No Current Homicidal Intent: No Current Homicidal Plan: No Access to Homicidal Means: No History of harm to others?: No Assessment of Violence: None Noted (IVC paperwork reports pt said she wants to harm someone) Violent Behavior Description: pt denies Does patient have access to weapons?: No Criminal Charges Pending?: No Does patient have a court date: No  Psychosis Hallucinations:  (  Unspecified-pt responding to internal stimuli, but couldn't ) Delusions: Unspecified;Persecutory (pt responding to internal stimuli, thought clinician had sto)  Mental Status Report Appear/Hygiene: Disheveled Eye Contact: Fair Motor Activity: Mannerisms;Restlessness;Freedom of movement Speech: Soft;Slow Level of Consciousness: Alert;Restless Mood: Suspicious Affect: Appropriate to  circumstance Anxiety Level: Minimal Thought Processes: Irrelevant;Flight of Ideas Judgement: Impaired Orientation: Person;Place Obsessive Compulsive Thoughts/Behaviors: None  Cognitive Functioning Concentration: Decreased Memory: Recent Impaired;Remote Impaired IQ: Average Insight: Poor Impulse Control: Poor Appetite: Fair Sleep: No Change Vegetative Symptoms: None  Prior Inpatient Therapy Prior Inpatient Therapy: Yes Prior Therapy Dates: unknown Prior Therapy Facilty/Provider(s): unknown Reason for Treatment: unknown  Prior Outpatient Therapy Prior Outpatient Therapy:  (unknown)            Values / Beliefs Cultural Requests During Hospitalization: None Spiritual Requests During Hospitalization: None        Additional Information 1:1 In Past 12 Months?: No CIRT Risk: No Elopement Risk: No Does patient have medical clearance?: Yes     Disposition:  Disposition Disposition of Patient: Inpatient treatment program Type of inpatient treatment program: Adult (sent to [facility] for review)  On Site Evaluation by:   Reviewed with Physician:     Steward Ros 09/04/2011 3:49 AM

## 2011-09-04 NOTE — ED Notes (Signed)
Up to bathroom

## 2011-09-04 NOTE — BHH Counselor (Signed)
Faxed labs, assessment and H&P to [facility].  [facility] took verbal referral.  Vickie confirmed fax went through.  Paper work filed in pt chart

## 2011-09-05 MED ORDER — QUETIAPINE FUMARATE 300 MG PO TABS
400.0000 mg | ORAL_TABLET | Freq: Every day | ORAL | Status: DC
  Administered 2011-09-05: 400 mg via ORAL
  Filled 2011-09-05: qty 1

## 2011-09-05 MED ORDER — QUETIAPINE FUMARATE 300 MG PO TABS
400.0000 mg | ORAL_TABLET | Freq: Once | ORAL | Status: AC
  Administered 2011-09-05: 400 mg via ORAL
  Filled 2011-09-05: qty 3
  Filled 2011-09-05: qty 1

## 2011-09-05 MED ORDER — DIVALPROEX SODIUM ER 500 MG PO TB24
1000.0000 mg | ORAL_TABLET | Freq: Every day | ORAL | Status: DC
  Administered 2011-09-05: 1000 mg via ORAL
  Filled 2011-09-05 (×2): qty 2

## 2011-09-05 MED ORDER — VALPROIC ACID 250 MG PO CAPS: 1000.0000 mg | ORAL_CAPSULE | Freq: Every day | ORAL | Status: DC

## 2011-09-05 NOTE — ED Notes (Signed)
Pt is very paranoid and it took a lot of coaxing to get her to take the medications even though the packages were opened in front her at the bedside. Another RN assisted and pt finally took the medication after reading the packaging. Pt kept saying that she didn't take meds at home and asked why was she here. Pt was told that her mom felt like she'd been off her meds and she'd been walking in the middle of street so her mom brought her here to get back on her meds, to clear up her thoughts and get her ready to leave the hospital also.

## 2011-09-05 NOTE — ED Notes (Signed)
Pt given sandwich and drink, she's laying in bed watching TV

## 2011-09-05 NOTE — ED Notes (Signed)
Spoke to EDP about ordering home medications as pt hasn't been starting on any medications since here in the hospital.

## 2011-09-06 DIAGNOSIS — F259 Schizoaffective disorder, unspecified: Secondary | ICD-10-CM

## 2011-09-06 NOTE — Discharge Instructions (Signed)
Follow up with out pt tx °

## 2011-09-06 NOTE — Consult Note (Signed)
Reason for Consult: Schizophrenia and paranoia Referring Physician: Dr. Windell [name] is an 41 y.o. female.  HPI: Patient was seen who and chart reviewed. Patient was known to the behavioral [facility] from her previous inpatient admission from April 12 - 19 for psychosis and noncompliance. Patient was presented to the [facility] long emergency department with the involuntary commitment papers from her mother for being emotionally unstable and confused. Patient reported she is missing her children 76 and 33 years old to social service several months ago which making her depressed, sad and tearful. Patient denies current suicidal ideation, homicidal ideation, intentions and plans. Patient has no evidence of auditory or visual hallucinations or delusions. Patient has mild paranoia, does not trust other people since the her children was taken away. Patient has a multiple acute psychiatric hospitalization at behavioral Health Center in the past. Patient has been receiving outpatient psychiatric services from the [facility] behavioral health. Patient has no history of for substance abuse or legal problems. Patient states herself and her mother lives close by. She has a 20 years old this grown up son who she is concerned about it but does not give the details.  Mental status: Patient appeared in her bed lying down and watching TV  without distress. Patient has no acute psychosis including hallucinations and delusions but reluctant to give personal information. Patient has taken some time to open up herself. Patient denied suicidal or homicidal ideations, intentions and plans. Patient agreed to followup with the outpatient psychiatric services.   Past Medical History  Diagnosis Date  . Depression   . Bipolar 1 disorder   . Hypertension   . Hemorrhoid   . Transfusion history   . Schizophrenia     Past Surgical History  Procedure Date  . Cesarean section     Family History  Problem Relation Age of  Onset  . Diabetes type II Other     Social History:  reports that she has been smoking.  She does not have any smokeless tobacco history on file. She reports that she does not drink alcohol or use illicit drugs.  Allergies:  Allergies  Allergen Reactions  . Flagyl (Metronidazole Hcl) Nausea Only  . Sulfa Antibiotics     nausea    Medications: I have reviewed the patient's current medications.  No results found for this or any previous visit (from the past 48 hour(s)).  No results found.  No anxiety, No psychosis and Positive for anxiety, bad mood and Missing her children is main stress Blood pressure 120/71, pulse 64, temperature 98.5 F (36.9 C), temperature source Oral, resp. rate 20, SpO2 99.00%.   Assessment/Plan: Schizoaffective disorder, chronic  Recommended outpatient psychiatric services at [facility] behavioral health and patient agreed with the treatment plan. She will continue taking her medication Depakote ER 500 mg ii Qhs and Seroquel 400 mg at bedtime. May provide one-week supply if needed. Patient requested bus pass to go home at this time and will contact social services about her request.   [name],Patricia R. 09/06/2011, 4:18 PM

## 2011-09-06 NOTE — BH Assessment (Signed)
Per psychiatrist,  Dr. Valora Corporal recommends that  patient is appropriate to be discharged home. Writer informed EDP (Dr. Estell Harpin) of Dr. Lezlie Lye recommendations. Patient's nurse-Sheila also made aware of patients discharge home.   **Prior to patient discharging from the ED, writer provided patient with referrals to out-pt psychiatrist, therapist, 24 hr/7day emergency crises walk-in clinic [facility]), mobile crises, support groups, etc

## 2011-09-24 ENCOUNTER — Emergency Department (HOSPITAL_COMMUNITY)
Admission: EM | Admit: 2011-09-24 | Discharge: 2011-09-25 | Disposition: A | Discharge status: Home / Self Care | Payer: Medicaid Other | Attending: Emergency Medicine | Admitting: Emergency Medicine

## 2011-09-24 ENCOUNTER — Encounter (HOSPITAL_COMMUNITY): Payer: Self-pay | Admitting: *Deleted

## 2011-09-24 DIAGNOSIS — F209 Schizophrenia, unspecified: Secondary | ICD-10-CM | POA: Insufficient documentation

## 2011-09-24 DIAGNOSIS — I10 Essential (primary) hypertension: Secondary | ICD-10-CM | POA: Insufficient documentation

## 2011-09-24 DIAGNOSIS — F17200 Nicotine dependence, unspecified, uncomplicated: Secondary | ICD-10-CM | POA: Insufficient documentation

## 2011-09-24 DIAGNOSIS — F319 Bipolar disorder, unspecified: Secondary | ICD-10-CM | POA: Insufficient documentation

## 2011-09-24 DIAGNOSIS — R110 Nausea: Secondary | ICD-10-CM | POA: Insufficient documentation

## 2011-09-24 LAB — CBC
HCT: 32 % — ABNORMAL LOW (ref 36.0–46.0)
Hemoglobin: 10.5 g/dL — ABNORMAL LOW (ref 12.0–15.0)
RBC: 3.86 MIL/uL — ABNORMAL LOW (ref 3.87–5.11)
RDW: 16.4 % — ABNORMAL HIGH (ref 11.5–15.5)
WBC: 7.9 10*3/uL (ref 4.0–10.5)

## 2011-09-24 MED ORDER — ONDANSETRON HCL 4 MG/2ML IJ SOLN
4.0000 mg | Freq: Once | INTRAMUSCULAR | Status: DC
  Filled 2011-09-24: qty 2

## 2011-09-24 MED ORDER — SODIUM CHLORIDE 0.9 % IV BOLUS (SEPSIS)
1000.0000 mL | Freq: Once | INTRAVENOUS | Status: DC
  Administered 2011-09-25: 1000 mL via INTRAVENOUS

## 2011-09-24 NOTE — ED Notes (Signed)
Patient with c/o of nausea for about 6 months.  Patient brought to ED by [facility].

## 2011-09-25 LAB — BASIC METABOLIC PANEL
BUN: 7 mg/dL (ref 6–23)
Chloride: 99 mEq/L (ref 96–112)
GFR calc Af Amer: >90 mL/min (ref >90)
Glucose, Bld: 86 mg/dL (ref 70–99)
Potassium: 3 mEq/L — ABNORMAL LOW (ref 3.5–5.1)

## 2011-09-25 MED ORDER — ONDANSETRON 4 MG PO TBDP
8.0000 mg | ORAL_TABLET | Freq: Once | ORAL | Status: AC
  Administered 2011-09-25: 8 mg via ORAL
  Filled 2011-09-25: qty 2

## 2011-09-25 MED ORDER — SODIUM CHLORIDE 0.9 % IV BOLUS (SEPSIS): 1000.0000 mL | Freq: Once | INTRAVENOUS | Status: DC

## 2011-09-25 MED ORDER — PROMETHAZINE HCL 25 MG PO TABS: 25.0000 mg | ORAL_TABLET | Freq: Four times a day (QID) | ORAL | Status: DC | PRN

## 2011-09-25 MED ORDER — POTASSIUM CHLORIDE CRYS ER 20 MEQ PO TBCR
20.0000 meq | EXTENDED_RELEASE_TABLET | Freq: Once | ORAL | Status: DC
  Filled 2011-09-25: qty 1

## 2011-09-25 NOTE — ED Notes (Signed)
PT interviewing with MD. Pt given warm blanket per request

## 2011-09-25 NOTE — ED Provider Notes (Signed)
History     CSN: [id number]  Arrival date & time 09/24/11  2205   First MD Initiated Contact with Patient 09/24/11 2344      Chief Complaint  Patient presents with  . Nausea    The history is provided by the patient.   the patient reports isolated nausea for 6 months.  She denies vomiting.  She denies diarrhea.  She's had no fevers or chills.  She denies abdominal pain chest pain or shortness of breath.  She has not seen anyone regarding her nausea.  She has a history of schizoaffective disorder and reports she is currently not taking her Seroquel nor her Depakote.  She has no suicidal or homicidal thoughts.  She denies hallucinations.  She is unable to tell me why she finally decided to come the emergency department today despite approximately 6 months of nausea.  She has no prior history of nausea.  Past Medical History  Diagnosis Date  . Depression   . Bipolar 1 disorder   . Hypertension   . Hemorrhoid   . Transfusion history   . Schizophrenia     Past Surgical History  Procedure Date  . Cesarean section     Family History  Problem Relation Age of Onset  . Diabetes type II Other     History  Substance Use Topics  . Smoking status: Current Everyday Smoker -- 0.5 packs/day for 13 years  . Smokeless tobacco: Not on file  . Alcohol Use: No    OB History    Grav Para Term Preterm Abortions TAB SAB Ect Mult Living                  Review of Systems  All other systems reviewed and are negative.    Allergies  Flagyl and Sulfa antibiotics  Home Medications  No current outpatient prescriptions on file.  BP 120/65  Pulse 53  Temp(Src) 98.1 F (36.7 C) (Oral)  Resp 18  SpO2 96%  Physical Exam  Nursing note and vitals reviewed. Constitutional: She is oriented to person, place, and time. She appears well-developed and well-nourished. No distress.  HENT:  Head: Normocephalic and atraumatic.  Eyes: EOM are normal.  Neck: Normal range of motion.    Cardiovascular: Normal rate, regular rhythm and normal heart sounds.   Pulmonary/Chest: Effort normal and breath sounds normal.  Abdominal: Soft. She exhibits no distension. There is no tenderness.  Musculoskeletal: Normal range of motion.  Neurological: She is alert and oriented to person, place, and time.  Skin: Skin is warm and dry.  Psychiatric: She has a normal mood and affect. Judgment normal.    ED Course  Procedures (including critical care time)  Labs Reviewed  CBC - Abnormal; Notable for the following:    RBC 3.86 (*)    Hemoglobin 10.5 (*)    HCT 32.0 (*)    RDW 16.4 (*)    All other components within normal limits  BASIC METABOLIC PANEL - Abnormal; Notable for the following:    Potassium 3.0 (*)    All other components within normal limits  HCG, SERUM, QUALITATIVE   No results found.   1. Nausea       MDM  The patient is hydrated appearing.  She's been tolerating oral fluids.  She we've given Zofran at this time.  Basic labs and urine will be checked.  Her abdomen is benign on exam.  12:41 AM Her potassium will be repleated. abd remains benign on exam.  The patient will be discharged home with prescription for antinausea medicine and instructions to orally hydrate herself.  I recommended followup with her primary care physician  2:10 AM The patient was agreeable to IV fluids she was initially refusing.  1 L of fluids given.  She feels much better at this time.  Discharge home in good condition.  PCP followup        Lyanne Co, MD 09/25/11 [phone]

## 2011-09-25 NOTE — Discharge Instructions (Signed)
Nausea, Adult Nausea is the feeling that you have an upset stomach or have to vomit. Nausea by itself is not likely a serious concern, but it may be an early sign of more serious medical problems. As nausea gets worse, it can lead to vomiting. If vomiting develops, there is the risk of dehydration.  CAUSES   Viral infections.   Food poisoning.   Medicines.   Pregnancy.   Motion sickness.   Migraine headaches.   Emotional distress.   Severe pain from any source.   Alcohol intoxication.  HOME CARE INSTRUCTIONS  Get plenty of rest.   Ask your caregiver about specific rehydration instructions.   Eat small amounts of food and sip liquids more often.   Take all medicines as told by your caregiver.  SEEK MEDICAL CARE IF:  You have not improved after 2 days, or you get worse.   You have a headache.  SEEK IMMEDIATE MEDICAL CARE IF:   You have a fever.   You faint.   You keep vomiting or have blood in your vomit.   You are extremely weak or dehydrated.   You have dark or bloody stools.   You have severe chest or abdominal pain.  MAKE SURE YOU:  Understand these instructions.   Will watch your condition.   Will get help right away if you are not doing well or get worse.  Document Released: 05/13/2004 Document Revised: 03/25/2011 Document Reviewed: 12/16/2010 [employer] Patient Information 2012 [employer]. 

## 2011-11-19 LAB — URINALYSIS W/ REFLEX CULTURE
Glucose: NEGATIVE MG/DL
Ketone: 40 MG/DL — AB
Leukocyte Esterase: NEGATIVE
Nitrites: NEGATIVE
Protein: 30 MG/DL — AB
Specific gravity: 1.029 (ref 1.003–1.030)
Urobilinogen: 0.2 EU/DL (ref 0.2–1.0)
pH (UA): 6 (ref 5.0–8.0)

## 2011-11-19 LAB — DRUG SCREEN, URINE
AMPHETAMINES: NEGATIVE
BARBITURATES: NEGATIVE
BENZODIAZEPINES: NEGATIVE
COCAINE: NEGATIVE
METHADONE: NEGATIVE
OPIATES: NEGATIVE
PCP(PHENCYCLIDINE): NEGATIVE
THC (TH-CANNABINOL): NEGATIVE

## 2011-11-19 LAB — POC CHEM8
Anion gap (POC): 19 mmol/L — ABNORMAL HIGH (ref 5–15)
BUN (POC): 7 MG/DL — ABNORMAL LOW (ref 9–20)
CO2 (POC): 26 MMOL/L (ref 21–32)
Calcium, ionized (POC): 1.21 MMOL/L (ref 1.12–1.32)
Chloride (POC): 103 MMOL/L (ref 98–107)
Creatinine (POC): 0.9 MG/DL (ref 0.6–1.3)
GFRAA, POC: >60 mL/min/{1.73_m2} (ref >60)
GFRNA, POC: >60 mL/min/{1.73_m2} (ref >60)
Glucose (POC): 99 MG/DL (ref 65–105)
Hematocrit (POC): 29 % — ABNORMAL LOW (ref 35.0–47.0)
Hemoglobin (POC): 9.9 GM/DL — ABNORMAL LOW (ref 11.5–16.0)
Potassium (POC): 3 MMOL/L — ABNORMAL LOW (ref 3.5–5.1)
Sodium (POC): 143 MMOL/L (ref 136–145)

## 2011-11-19 LAB — HCG URINE, QL. - POC: Pregnancy test,urine (POC): NEGATIVE

## 2011-11-19 LAB — ETHYL ALCOHOL: ALCOHOL(ETHYL),SERUM: <10 MG/DL (ref <10)

## 2011-11-19 MED ORDER — CEPHALEXIN 500 MG CAP
500 mg | ORAL_CAPSULE | Freq: Three times a day (TID) | ORAL | Status: AC
Start: 2011-11-19 — End: 2011-11-26

## 2011-11-19 MED ADMIN — ketorolac (TORADOL) injection 60 mg: INTRAMUSCULAR | @ 23:00:00 | NDC 63323016201

## 2011-11-19 MED ADMIN — potassium chloride SR (KLOR-CON 10) tablet 40 mEq: ORAL | @ 23:00:00 | NDC 00245004189

## 2011-11-19 MED ADMIN — ondansetron (ZOFRAN ODT) tablet 4 mg: ORAL | @ 22:00:00 | NDC 00781523806

## 2011-11-19 MED FILL — K-TAB 10 MEQ TABLET,EXTENDED RELEASE: 10 mEq | ORAL | Qty: 4

## 2011-11-19 MED FILL — ONDANSETRON 4 MG TAB, RAPID DISSOLVE: 4 mg | ORAL | Qty: 1

## 2011-11-19 MED FILL — KETOROLAC TROMETHAMINE 30 MG/ML INJECTION: 30 mg/mL (1 mL) | INTRAMUSCULAR | Qty: 2
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
## 2011-11-19 NOTE — ED Notes (Signed)
Patient (s)  given copy of dc instructions and 1 script(s).  Patient (s)  verbalized understanding of instructions and script (s).  Patient given a current medication reconciliation form and verbalized understanding of their medications.   Patient (s) verbalized understanding of the importance of discussing medications with  his or her physician or clinic they will be following up with.  Patient alert and oriented and in no acute distress.  Patient discharged home ambulatory with self. Patient given a yellow cab ticket to the [employer] bus station, per pt she is from [facility] and is trying to get home
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
## 2011-11-19 NOTE — ED Notes (Signed)
Pt refusing Po potassium, states she is sick and doesn't want it. PA notified

## 2011-11-19 NOTE — ED Notes (Signed)
MD at [facility]. Pt agrees to potassium and food

## 2011-11-19 NOTE — ED Notes (Signed)
[employer] at [place]

## 2011-11-19 NOTE — ED Notes (Signed)
Pt states she is nauseous currently, given PO nausea medication. Pt refuses food/drink currently, will continue to monitor
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
## 2011-11-21 LAB — CULTURE, URINE
Colonies Counted: 55000
Colony Count: 55000

## 2011-11-24 NOTE — ED Provider Notes (Signed)
I personally saw and examined the patient.  I have reviewed and agree with the MLP's findings, including all diagnostic interpretations, and plans as written.   I was present during the key portions of separately billed procedures.    Consetta Cosner S Karter Hellmer, MD

## 2011-12-22 ENCOUNTER — Encounter (HOSPITAL_COMMUNITY): Payer: Self-pay | Admitting: *Deleted

## 2011-12-22 ENCOUNTER — Emergency Department (HOSPITAL_COMMUNITY)
Admission: EM | Admit: 2011-12-22 | Discharge: 2011-12-22 | Disposition: A | Discharge status: Home / Self Care | Payer: Medicaid Other | Attending: Emergency Medicine | Admitting: Emergency Medicine

## 2011-12-22 DIAGNOSIS — F319 Bipolar disorder, unspecified: Secondary | ICD-10-CM | POA: Insufficient documentation

## 2011-12-22 DIAGNOSIS — M79609 Pain in unspecified limb: Secondary | ICD-10-CM | POA: Insufficient documentation

## 2011-12-22 DIAGNOSIS — F17200 Nicotine dependence, unspecified, uncomplicated: Secondary | ICD-10-CM | POA: Insufficient documentation

## 2011-12-22 DIAGNOSIS — I10 Essential (primary) hypertension: Secondary | ICD-10-CM | POA: Insufficient documentation

## 2011-12-22 DIAGNOSIS — F209 Schizophrenia, unspecified: Secondary | ICD-10-CM | POA: Insufficient documentation

## 2011-12-22 DIAGNOSIS — M79606 Pain in leg, unspecified: Secondary | ICD-10-CM

## 2011-12-22 MED ORDER — QUETIAPINE FUMARATE 100 MG PO TABS: 300.0000 mg | ORAL_TABLET | Freq: Every day | ORAL | Status: DC

## 2011-12-22 MED ORDER — HYDROCODONE-ACETAMINOPHEN 5-325 MG PO TABS
1.0000 | ORAL_TABLET | Freq: Once | ORAL | Status: AC
  Administered 2011-12-22: 1 via ORAL
  Filled 2011-12-22: qty 1

## 2011-12-22 MED ORDER — TRAMADOL HCL 50 MG PO TABS: 50.0000 mg | ORAL_TABLET | Freq: Four times a day (QID) | ORAL | Status: AC | PRN

## 2011-12-22 NOTE — ED Provider Notes (Signed)
History     CSN: [id number]  Arrival date & time 12/22/11  0011   First MD Initiated Contact with Patient 12/22/11 0132      Chief Complaint  Patient presents with  . Foot Pain    (Consider location/radiation/quality/duration/timing/severity/associated sxs/prior treatment) HPI  Pt to ER per EMS for foot pain. Pt tells me that she has not been on her mental medications and needs her Seroquel refilled because she lost it. Her legs hurt her. She has not had any injury or fall. They hurt all over. She can ambulate without any difficulty. No lose of bowel or urine incontinence. The quality is scratching. The pain does not radiate and is a 6/10. The patient is not in distress and is resting comfortably in her stretcher.  Past Medical History  Diagnosis Date  . Depression   . Bipolar 1 disorder   . Hypertension   . Hemorrhoid   . Transfusion history   . Schizophrenia     Past Surgical History  Procedure Date  . Cesarean section     Family History  Problem Relation Age of Onset  . Diabetes type II Other     History  Substance Use Topics  . Smoking status: Current Everyday Smoker -- 0.5 packs/day for 13 years  . Smokeless tobacco: Not on file  . Alcohol Use: No    OB History    Grav Para Term Preterm Abortions TAB SAB Ect Mult Living                  Review of Systems   Review of Systems  Gen: no weight loss, fevers, chills, night sweats  Eyes: no discharge or drainage, no occular pain or visual changes  Nose: no epistaxis or rhinorrhea  Mouth: no dental pain, no sore throat  Neck: no neck pain  Lungs:No wheezing, coughing or hemoptysis CV: no chest pain, palpitations, dependent edema or orthopnea  Abd: no abdominal pain, nausea, vomiting  GU: no dysuria or gross hematuria  MSK:  Bilateral leg pain Neuro: no headache, no focal neurologic deficits  Skin: no abnormalities Psyche: + schizophrenia and bipolar disorder    Allergies  Flagyl and Sulfa  antibiotics  Home Medications   Current Outpatient Rx  Name Route Sig Dispense Refill  . VITAMIN D 1000 UNITS PO TABS Oral Take 1,000 Units by mouth daily.    Marland Kitchen FERROUS SULFATE 325 (65 FE) MG PO TABS Oral Take 325 mg by mouth 3 (three) times daily with meals.     Marland Kitchen LORAZEPAM 1 MG PO TABS Oral Take 1 mg by mouth every 8 (eight) hours as needed. For anxiety    . SENNOSIDES-DOCUSATE SODIUM 8.6-50 MG PO TABS Oral Take 1 tablet by mouth daily.    Marland Kitchen NAPROXEN 250 MG PO TABS Oral Take 500 mg by mouth daily as needed. For pain    . QUETIAPINE FUMARATE 100 MG PO TABS Oral Take 3 tablets (300 mg total) by mouth at bedtime. 45 tablet 0  . TRAMADOL HCL 50 MG PO TABS Oral Take 1 tablet (50 mg total) by mouth every 6 (six) hours as needed for pain. 12 tablet 0    BP 105/51  Pulse 69  Temp 98.1 F (36.7 C) (Oral)  Resp 16  SpO2 97%  Physical Exam  Constitutional: She appears well-developed and well-nourished. No distress.  HENT:  Head: Normocephalic and atraumatic.  Eyes: Pupils are equal, round, and reactive to light.  Neck: Trachea normal, normal range of  motion and full passive range of motion without pain. Neck supple.  Cardiovascular: Normal rate, regular rhythm, normal heart sounds and normal pulses.   Pulmonary/Chest: Effort normal and breath sounds normal. Chest wall is not dull to percussion. She exhibits no crepitus, no edema, no deformity and no retraction.  Abdominal: Normal appearance.  Musculoskeletal: Normal range of motion.       Right knee: She exhibits normal range of motion, no swelling, no effusion, no ecchymosis, no deformity, no laceration, no erythema and normal alignment. no tenderness found.       No physical abnormalities noted to bilateral legs.   Skin is moist and warm. The color is appropriate without bruising or swelling. No rash noted. ROM is physiologic, pulses are physiologic.   Neurological: She is alert. She has normal strength.  Skin: Skin is warm, dry and  intact. She is not diaphoretic.  Psychiatric: She has a normal mood and affect. Her speech is normal. Cognition and memory are normal.    ED Course  Procedures (including critical care time)  Labs Reviewed - No data to display No results found.   1. Schizophrenia   2. Leg pain       MDM  Given Toradol Rx. Seroquel also refilled. No abnormalities on physical exam noted to explain patients symptoms. No images obtained due to the full length of both legs filling "scratchy" without any abnormal PE findings. She has been referred back to her psychologist and PCP. Before discharge, she admits her symptoms are much better.  Pt has been advised of the symptoms that warrant their return to the ED. Patient has voiced understanding and has agreed to follow-up with the PCP or specialist.         Dorthula Matas, PA 12/25/11 1105
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
## 2011-12-25 NOTE — ED Provider Notes (Signed)
Medical screening examination/treatment/procedure(s) were performed by non-physician practitioner and as supervising physician I was immediately available for consultation/collaboration.  Tyson Masin, MD 12/25/11 2325 

## 2012-04-10 ENCOUNTER — Encounter (HOSPITAL_COMMUNITY): Payer: Self-pay | Admitting: *Deleted

## 2012-04-10 ENCOUNTER — Emergency Department (HOSPITAL_COMMUNITY)
Admission: EM | Admit: 2012-04-10 | Discharge: 2012-04-14 | Disposition: A | Discharge status: Other Acute Inpatient Hospital | Payer: Medicaid Other | Attending: Emergency Medicine | Admitting: Emergency Medicine

## 2012-04-10 DIAGNOSIS — F17200 Nicotine dependence, unspecified, uncomplicated: Secondary | ICD-10-CM | POA: Insufficient documentation

## 2012-04-10 DIAGNOSIS — F259 Schizoaffective disorder, unspecified: Secondary | ICD-10-CM | POA: Insufficient documentation

## 2012-04-10 DIAGNOSIS — Z8719 Personal history of other diseases of the digestive system: Secondary | ICD-10-CM | POA: Insufficient documentation

## 2012-04-10 DIAGNOSIS — F3130 Bipolar disorder, current episode depressed, mild or moderate severity, unspecified: Secondary | ICD-10-CM | POA: Insufficient documentation

## 2012-04-10 DIAGNOSIS — I10 Essential (primary) hypertension: Secondary | ICD-10-CM | POA: Insufficient documentation

## 2012-04-10 DIAGNOSIS — F319 Bipolar disorder, unspecified: Secondary | ICD-10-CM

## 2012-04-10 DIAGNOSIS — Z3202 Encounter for pregnancy test, result negative: Secondary | ICD-10-CM | POA: Insufficient documentation

## 2012-04-10 MED ORDER — LORAZEPAM 1 MG PO TABS: 1.0000 mg | ORAL_TABLET | Freq: Three times a day (TID) | ORAL | Status: DC | PRN

## 2012-04-10 MED ORDER — NICOTINE 21 MG/24HR TD PT24
21.0000 mg | MEDICATED_PATCH | Freq: Every day | TRANSDERMAL | Status: DC
  Filled 2012-04-10 (×2): qty 1

## 2012-04-10 MED ORDER — ACETAMINOPHEN 325 MG PO TABS: 650.0000 mg | ORAL_TABLET | ORAL | Status: DC | PRN

## 2012-04-10 MED ORDER — ZIPRASIDONE MESYLATE 20 MG IM SOLR
20.0000 mg | Freq: Once | INTRAMUSCULAR | Status: AC | PRN
  Filled 2012-04-10: qty 20

## 2012-04-10 MED ORDER — ZOLPIDEM TARTRATE 5 MG PO TABS
5.0000 mg | ORAL_TABLET | Freq: Every evening | ORAL | Status: DC | PRN
  Administered 2012-04-13: 5 mg via ORAL
  Filled 2012-04-10: qty 1
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
## 2012-04-10 NOTE — Progress Notes (Signed)
CSW met with pt at bedside. Pt reported that she did not have anywhere to go, and needed a shelter. CSW also asked patient is she had thoughts of suicide. Patient hesitated and said no, however per chart review patient has reported suicide and then later denied. CSW and EDP discussed, and pt will be seen by telepsych.  EDP asked for patient to be ivc'd.    Catha Gosselin, LCSWA  [phone] .04/10/2012 11:11pm

## 2012-04-10 NOTE — ED Notes (Signed)
Patient refused lab drawValla Leaver RN made aware.

## 2012-04-11 ENCOUNTER — Encounter (HOSPITAL_COMMUNITY): Payer: Self-pay

## 2012-04-11 LAB — RAPID URINE DRUG SCREEN, HOSP PERFORMED
Amphetamines: NOT DETECTED
Cocaine: NOT DETECTED
Opiates: NOT DETECTED
Tetrahydrocannabinol: NOT DETECTED

## 2012-04-11 LAB — COMPREHENSIVE METABOLIC PANEL
ALT: 7 U/L (ref 0–35)
Alkaline Phosphatase: 56 U/L (ref 39–117)
BUN: 13 mg/dL (ref 6–23)
Chloride: 102 mEq/L (ref 96–112)
GFR calc Af Amer: >90 mL/min (ref >90)
Glucose, Bld: 105 mg/dL — ABNORMAL HIGH (ref 70–99)
Potassium: 3.1 mEq/L — ABNORMAL LOW (ref 3.5–5.1)
Sodium: 133 mEq/L — ABNORMAL LOW (ref 135–145)
Total Bilirubin: 0.3 mg/dL (ref 0.3–1.2)
Total Protein: 7.9 g/dL (ref 6.0–8.3)

## 2012-04-11 LAB — URINALYSIS, ROUTINE W REFLEX MICROSCOPIC
Glucose, UA: NEGATIVE mg/dL
Ketones, ur: NEGATIVE mg/dL
Protein, ur: NEGATIVE mg/dL

## 2012-04-11 LAB — CBC
Hemoglobin: 9.5 g/dL — ABNORMAL LOW (ref 12.0–15.0)
MCHC: 33.6 g/dL (ref 30.0–36.0)
Platelets: 302 10*3/uL (ref 150–400)
RBC: 3.49 MIL/uL — ABNORMAL LOW (ref 3.87–5.11)

## 2012-04-11 LAB — URINE MICROSCOPIC-ADD ON

## 2012-04-11 LAB — ACETAMINOPHEN LEVEL: Acetaminophen (Tylenol), Serum: <15 ug/mL (ref 10–30)

## 2012-04-11 MED ORDER — ZIPRASIDONE MESYLATE 20 MG IM SOLR
20.0000 mg | Freq: Once | INTRAMUSCULAR | Status: AC
  Administered 2012-04-11: 20 mg via INTRAMUSCULAR

## 2012-04-11 MED ORDER — POTASSIUM CHLORIDE CRYS ER 20 MEQ PO TBCR: 40.0000 meq | EXTENDED_RELEASE_TABLET | Freq: Once | ORAL | Status: DC

## 2012-04-11 MED ORDER — NITROFURANTOIN MONOHYD MACRO 100 MG PO CAPS
100.0000 mg | ORAL_CAPSULE | Freq: Two times a day (BID) | ORAL | Status: DC
  Administered 2012-04-11 – 2012-04-13 (×5): 100 mg via ORAL
  Filled 2012-04-11 (×8): qty 1
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
## 2012-04-11 NOTE — ED Notes (Signed)
During medication administration there was a bra sitting on counter. Explained to pt all of her belongings should have been locked up in a locker and let her know that I would place her bra in her locker. Pt verbalized understanding.

## 2012-04-12 NOTE — BHH Counselor (Signed)
Per Amor, pt now on [facility] wait list.

## 2012-04-12 NOTE — BHH Counselor (Signed)
[facility] rep [name] - pt's Berkley Harvey # is [phone] for [facility]) for 7 days from 04/12/12 to 04/18/12.
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
## 2012-04-13 NOTE — ED Notes (Signed)
Pt is sleeping requesting to take meds after she wakes up.

## 2012-04-13 NOTE — ED Provider Notes (Signed)
Filed Vitals:   04/13/12 0440  BP: 110/65  Pulse: 51  Temp: 97.6 F (36.4 C)  Resp: 18   Pt seen and assessed. No new complaints. Pt IVC'd and pending placement.   Raeford Razor, MD 04/13/12 [phone]
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
## 2012-04-14 NOTE — ED Provider Notes (Signed)
7:59 AM Pt is on [facility] wait list. No complaints today  Filed Vitals:   04/14/12 0614  BP: 99/62  Pulse: 51  Temp: 98.2 F (36.8 C)  Resp: 18     Lyanne Co, MD 04/14/12 0800

## 2012-04-14 NOTE — Clinical Social Work Note (Signed)
Pt on waiting list at [facility] per [facility].  Pt also being reviewed at [facility] for inpatient tx.  [name], LCSWA [phone]  Clinical Social Work

## 2012-04-21 ENCOUNTER — Emergency Department (HOSPITAL_COMMUNITY)
Admission: EM | Admit: 2012-04-21 | Discharge: 2012-04-21 | Discharge status: Against Medical Advice | Payer: Medicaid Other | Attending: Emergency Medicine | Admitting: Emergency Medicine

## 2012-04-21 DIAGNOSIS — R5381 Other malaise: Secondary | ICD-10-CM | POA: Insufficient documentation

## 2012-04-21 DIAGNOSIS — R5383 Other fatigue: Secondary | ICD-10-CM | POA: Insufficient documentation

## 2012-04-21 NOTE — ED Notes (Signed)
Patient sitting in waiting room, stated, "No, I dont want to see anyone. I'm waiting for a ride." RN Kennyth Arnold and [place] notified.

## 2012-04-21 NOTE — ED Notes (Addendum)
Patient wandering waiting room and not wanting to sit in triage room. RN Manus Gunning notified.

## 2012-04-21 NOTE — ED Notes (Signed)
Per EMS, vomited yesterday-patient also stated she lives in the [address] and Fay Records is her father

## 2012-05-10 ENCOUNTER — Emergency Department (HOSPITAL_COMMUNITY)
Admission: EM | Admit: 2012-05-10 | Discharge: 2012-05-10 | Disposition: A | Discharge status: Home / Self Care | Payer: Medicaid Other

## 2012-05-10 ENCOUNTER — Emergency Department (HOSPITAL_COMMUNITY)
Admission: EM | Admit: 2012-05-10 | Discharge: 2012-05-10 | Discharge status: Against Medical Advice | Payer: Medicaid Other | Attending: Emergency Medicine | Admitting: Emergency Medicine

## 2012-05-10 DIAGNOSIS — Z9119 Patient's noncompliance with other medical treatment and regimen: Secondary | ICD-10-CM | POA: Insufficient documentation

## 2012-05-10 DIAGNOSIS — Z91199 Patient's noncompliance with other medical treatment and regimen due to unspecified reason: Secondary | ICD-10-CM | POA: Insufficient documentation

## 2012-05-10 DIAGNOSIS — Z008 Encounter for other general examination: Secondary | ICD-10-CM | POA: Insufficient documentation

## 2012-05-10 DIAGNOSIS — R630 Anorexia: Secondary | ICD-10-CM | POA: Insufficient documentation

## 2012-05-10 DIAGNOSIS — F17200 Nicotine dependence, unspecified, uncomplicated: Secondary | ICD-10-CM | POA: Insufficient documentation

## 2012-05-10 NOTE — ED Notes (Signed)
Pt is refusing to be treated.  Pt will not leave the waiting room to come to triage.  Pt's family states that they tried to IVC her but the magistrate will only IVC her to go to [facility].  Family states that she has been to [facility] all her life and that they don't want her to go back.  This Clinical research associate talked with Diane, CN and social work to explain to family that we are unable to hold the pt here against her will without IVC paperwork.  Family encouraged to go back to the magistrates office and file papers.  Family states that pt is bipolar/schizophrenic and has not taken her medicine in 8 months.  Pt has also not been eating or drinking.

## 2012-05-10 NOTE — ED Notes (Signed)
Pt never seen by staff. Pt left building before coming into triage. Mother checked pt in

## 2012-05-13 ENCOUNTER — Emergency Department (HOSPITAL_COMMUNITY)
Admission: EM | Admit: 2012-05-13 | Discharge: 2012-05-18 | Disposition: A | Discharge status: Other Acute Inpatient Hospital | Payer: Medicaid Other | Attending: Emergency Medicine | Admitting: Emergency Medicine

## 2012-05-13 ENCOUNTER — Encounter (HOSPITAL_COMMUNITY): Payer: Self-pay | Admitting: Emergency Medicine

## 2012-05-13 DIAGNOSIS — F250 Schizoaffective disorder, bipolar type: Secondary | ICD-10-CM

## 2012-05-13 DIAGNOSIS — Z91199 Patient's noncompliance with other medical treatment and regimen due to unspecified reason: Secondary | ICD-10-CM | POA: Insufficient documentation

## 2012-05-13 DIAGNOSIS — Z9119 Patient's noncompliance with other medical treatment and regimen: Secondary | ICD-10-CM | POA: Insufficient documentation

## 2012-05-13 DIAGNOSIS — F319 Bipolar disorder, unspecified: Secondary | ICD-10-CM | POA: Insufficient documentation

## 2012-05-13 DIAGNOSIS — F259 Schizoaffective disorder, unspecified: Secondary | ICD-10-CM | POA: Insufficient documentation

## 2012-05-13 DIAGNOSIS — I10 Essential (primary) hypertension: Secondary | ICD-10-CM | POA: Insufficient documentation

## 2012-05-13 DIAGNOSIS — F17200 Nicotine dependence, unspecified, uncomplicated: Secondary | ICD-10-CM | POA: Insufficient documentation

## 2012-05-13 DIAGNOSIS — R5381 Other malaise: Secondary | ICD-10-CM | POA: Insufficient documentation

## 2012-05-13 LAB — CBC
HCT: 31.8 % — ABNORMAL LOW (ref 36.0–46.0)
Hemoglobin: 10.3 g/dL — ABNORMAL LOW (ref 12.0–15.0)
MCH: 27 pg (ref 26.0–34.0)
MCHC: 32.4 g/dL (ref 30.0–36.0)
MCV: 83.5 fL (ref 78.0–100.0)
Platelets: 401 10*3/uL — ABNORMAL HIGH (ref 150–400)
RBC: 3.81 MIL/uL — ABNORMAL LOW (ref 3.87–5.11)
RDW: 16.1 % — ABNORMAL HIGH (ref 11.5–15.5)
WBC: 6.4 10*3/uL (ref 4.0–10.5)

## 2012-05-13 LAB — COMPREHENSIVE METABOLIC PANEL
BUN: 8 mg/dL (ref 6–23)
CO2: 26 mEq/L (ref 19–32)
Calcium: 9.5 mg/dL (ref 8.4–10.5)
Chloride: 102 mEq/L (ref 96–112)
Creatinine, Ser: 0.73 mg/dL (ref 0.50–1.10)
GFR calc non Af Amer: >90 mL/min (ref >90)
Total Bilirubin: 0.4 mg/dL (ref 0.3–1.2)

## 2012-05-13 LAB — SALICYLATE LEVEL: Salicylate Lvl: <2 mg/dL — ABNORMAL LOW (ref 2.8–20.0)

## 2012-05-13 LAB — ETHANOL: Alcohol, Ethyl (B): <11 mg/dL (ref 0–11)

## 2012-05-13 MED ORDER — ZIPRASIDONE HCL 20 MG PO CAPS
20.0000 mg | ORAL_CAPSULE | Freq: Once | ORAL | Status: AC
  Administered 2012-05-15: 20 mg via ORAL
  Filled 2012-05-13: qty 1

## 2012-05-13 NOTE — ED Provider Notes (Signed)
History     CSN: [id number]  Arrival date & time 05/13/12  0944   First MD Initiated Contact with Patient 05/13/12 1013      Chief Complaint  Patient presents with  . Fatigue  . Medical Clearance    (Consider location/radiation/quality/duration/timing/severity/associated sxs/prior treatment) HPI Comments: 42 y/o female, with hx of bipolar disorder and Schizophrenia presents to the ED - she states that she is here b/c she is tired, she is unable to answer any questions clearly, takes a very long time to answer and then it is one word answers. She states that she was sent to the ED for evaluation, brought by her children - states that Obama told her to come to the ED for evaluation - She was told it was because of Herpes - denies having symptoms there.  She denies ETOH or drug use -0 states that she is not currently medicated for her psychiatric problems.  Denies having psych history.  According to the medical record, the pt has been seen here recently against her will brought by family but not with IVC papers - she left on her own free will and was not dangerous to herself or others.   Level 5 Caveat applies secondary to disordered thoughts and psychiatric illness  The history is provided by the patient.    Past Medical History  Diagnosis Date  . Depression   . Bipolar 1 disorder   . Hypertension   . Hemorrhoid   . Transfusion history   . Schizophrenia     Past Surgical History  Procedure Date  . Cesarean section     Family History  Problem Relation Age of Onset  . Diabetes type II Other     History  Substance Use Topics  . Smoking status: Current Every Day Smoker -- 0.5 packs/day for 13 years  . Smokeless tobacco: Not on file  . Alcohol Use: No    OB History    Grav Para Term Preterm Abortions TAB SAB Ect Mult Living                  Review of Systems  Unable to perform ROS: Psychiatric disorder    Allergies  Flagyl and Sulfa antibiotics  Home  Medications  No current outpatient prescriptions on file.  BP 141/62  Pulse 65  Temp 98.1 F (36.7 C) (Oral)  Resp 20  SpO2 100%  LMP 05/13/2012  Physical Exam  Nursing note and vitals reviewed. Constitutional: She appears well-developed and well-nourished. No distress.  HENT:  Head: Normocephalic and atraumatic.  Mouth/Throat: Oropharynx is clear and moist. No oropharyngeal exudate.  Eyes: Conjunctivae normal and EOM are normal. Pupils are equal, round, and reactive to light. Right eye exhibits no discharge. Left eye exhibits no discharge. No scleral icterus.  Neck: Normal range of motion. Neck supple. No JVD present. No thyromegaly present.  Cardiovascular: Normal rate, regular rhythm, normal heart sounds and intact distal pulses.  Exam reveals no gallop and no friction rub.   No murmur heard. Pulmonary/Chest: Effort normal and breath sounds normal. No respiratory distress. She has no wheezes. She has no rales.  Abdominal: Soft. Bowel sounds are normal. She exhibits no distension and no mass. There is no tenderness.  Musculoskeletal: Normal range of motion. She exhibits no edema and no tenderness.  Lymphadenopathy:    She has no cervical adenopathy.  Neurological: She is alert. Coordination normal.  Skin: Skin is warm and dry. No rash noted. No erythema.  Psychiatric:  Bizarre affect, shifty eyes - patient cannot maintain eye contact, continually looking around the room, answers in one-word sentences and often takes more than 60 seconds to answer a question. She has disorder thoughts, appears mildly agitated, does not appear to be hallucinating, states that Obama told her to come to the hospital.  She denies SI / HI    ED Course  Procedures (including critical care time)   Labs Reviewed  ACETAMINOPHEN LEVEL  CBC  COMPREHENSIVE METABOLIC PANEL  ETHANOL  SALICYLATE LEVEL  URINE RAPID DRUG SCREEN (HOSP PERFORMED)   No results found.   No diagnosis found.    MDM    The patient appears to be delusional, she is agitated, it appears that she is likely having a psychotic episode. She does not appear to be a danger to herself or others but at the same time is not appear to be functional. She will require psychiatric evaluation. Holding orders written.  Filed Vitals:   05/13/12 0951  BP: 141/62  Pulse: 65  Temp: 98.1 F (36.7 C)  Resp: 20    Change of shift - care signed out to oncoming EDP - pt awaiting ACT team evaluation at this time       Vida Roller, MD 05/15/12 1126

## 2012-05-13 NOTE — ED Notes (Signed)
Patient laying on the bed watching TV. Asked patient if she could provide a urine specimen at this time and patient stated "no" and then proceeded to ask for something to drink. Patient was asked if she would be willing to speak with ACT counselor. Patient closed her eyes and did not answer question.

## 2012-05-13 NOTE — ED Notes (Addendum)
Pt will sign out AMA

## 2012-05-13 NOTE — BHH Counselor (Signed)
Writer attempted to assess pt. Pt lies on side facing wall and refuses to speak. Pt's RN Pam asks pt multiple times to turn over and speak with Chief Executive Officer. Pt continues lying on side and won't speak. Writer will attempt to assess pt later.

## 2012-05-13 NOTE — ED Notes (Addendum)
Pt in with [employer] from home. Pt reports that she "is tired" and has no other c/o. Pt adds that she want to "commit suicide" but will not answer any other questions. [employer] at bedside. Pt in NAD

## 2012-05-13 NOTE — BHH Counselor (Signed)
Telepsych consult ordered. Dr. Rob Bunting will do consult in approx 1 hr.

## 2012-05-13 NOTE — ED Notes (Signed)
Pt was told numerous time that her wig i not allowed for safety reasons pt started  Laughing and yelling and giving me cold flat looks Dr Hyacinth Meeker

## 2012-05-14 MED ORDER — ZIPRASIDONE MESYLATE 20 MG IM SOLR
10.0000 mg | Freq: Four times a day (QID) | INTRAMUSCULAR | Status: DC | PRN
  Administered 2012-05-14 – 2012-05-17 (×2): 10 mg via INTRAMUSCULAR
  Filled 2012-05-14 (×2): qty 20

## 2012-05-14 MED ORDER — RISPERIDONE 1 MG PO TABS
1.0000 mg | ORAL_TABLET | Freq: Two times a day (BID) | ORAL | Status: DC
  Administered 2012-05-14 – 2012-05-15 (×2): 1 mg via ORAL
  Filled 2012-05-14 (×5): qty 1

## 2012-05-14 NOTE — ED Notes (Signed)
Pt given juice and coke per her request; pt denies SI and HI.  Pt continues to refuse to have vital signs taken.  RN continuing to monitor.

## 2012-05-14 NOTE — ED Notes (Signed)
Attempted to get patient to provide a urine sample prior to injection of geodon. Patient unable. Patient just went to bathroom and stated [name] forgot to collect urine. Patient has refused multiple time to provide urine on previous shift.Patient has been aware of request since 05/13/12 after admission. Noncompliant. Will attempt to collect urine when patient wakes back up.

## 2012-05-14 NOTE — ED Provider Notes (Signed)
[name] is a 42 y.o. female who is being evaluated for bizarre and delusional behavior. She's been seen by the tele-psychiatrist, who has diagnosed schizoaffective disorder, unspecified. He recommends, that she be admitted for treatment to a psychiatric unit. He recommends starting Risperdal 1 mg twice a day and Geodon 10 mg IM to 6 hours when necessary agitation. ACT, is aware and will work on placement.    Flint Melter, MD 05/14/12 [phone]

## 2012-05-14 NOTE — ED Provider Notes (Signed)
Filed Vitals:   05/14/12 0630  BP:   Pulse:   Temp: 98.2 F (36.8 C)  Resp: 20   Pt seen and assessed. Bizarre affect and not very forthcoming with history. Pt apparently with SI, but vague and short answers to my questions. Telepsych consult ordered.   Raeford Razor, MD 05/14/12 [phone]

## 2012-05-14 NOTE — ED Notes (Signed)
Pt assisted by Fleet Contras, RN to take pink panties and wig off and placed in belongings bag which were then placed in locker 34.

## 2012-05-14 NOTE — BH Assessment (Signed)
Assessment Note   [name] is an 42 y.o. female. Pt is now under IVC after presenting to [facility] via EMS. Pt told admitting staff she was suicidal and "was tired". Pt currently denies SI and HI to Clinical research associate. Pt poor historian. She reports she is at [facility] b/c "I'm supposed to do some type of test or something". Pt doesn't elaborate. Pt either doesn't answer questions asked or answers with "uh huh" or "[employer]". Pt oriented to self and location but thinks it is 2013. Pt has hx of schizoaffective disorder per chart review. Pt says EMS gave her ride to [facility] but then says she didn't ask to be taknen to [facility]. She reports she lives w/ her parents. UDS hasn't been collected as she refuses to urinate. Pt's eyes closed. She appears to be internally preoccupied. Dr. Melida Gimenez telepsych consult recommends inpatient treatment.  Axis I: Schizoaffective Disorder Axis II: Deferred Axis III:  Past Medical History  Diagnosis Date  . Depression   . Bipolar 1 disorder   . Hypertension   . Hemorrhoid   . Transfusion history   . Schizophrenia    Axis IV: other psychosocial or environmental problems and problems related to social environment Axis V: 31-40 impairment in reality testing  Past Medical History:  Past Medical History  Diagnosis Date  . Depression   . Bipolar 1 disorder   . Hypertension   . Hemorrhoid   . Transfusion history   . Schizophrenia     Past Surgical History  Procedure Date  . Cesarean section     Family History:  Family History  Problem Relation Age of Onset  . Diabetes type II Other     Social History:  reports that she has been smoking.  She does not have any smokeless tobacco history on file. She reports that she does not drink alcohol or use illicit drugs.  Additional Social History:  Alcohol / Drug Use Pain Medications: none Prescriptions: none Over the Counter: none History of alcohol / drug use?: No history of alcohol / drug abuse  CIWA: CIWA-Ar BP: 120/80  mmHg Pulse Rate: 68  COWS:    Allergies:  Allergies  Allergen Reactions  . Flagyl (Metronidazole Hcl) Nausea Only  . Sulfa Antibiotics     nausea    Home Medications:  (Not in a hospital admission)  OB/GYN Status:  Patient's last menstrual period was 05/13/2012.  General Assessment Data Location of Assessment: [facility] Living Arrangements: Parent Can pt return to current living arrangement?:  (unable to assess) Admission Status: Involuntary Is patient capable of signing voluntary admission?: No Transfer from: Home Referral Source: Self/Family/Friend  Education Status Is patient currently in school?: No Current Grade: na Highest grade of school patient has completed: 12  Risk to self Suicidal Ideation: No (pt currently denies but recently told RN she was SI) Suicidal Intent: No Is patient at risk for suicide?: Yes Suicidal Plan?: No Access to Means:  (unable to assess) What has been your use of drugs/alcohol within the last 12 months?: pt denies substance sue Previous Attempts/Gestures: No How many times?: 0  Other Self Harm Risks: unable to assess Triggers for Past Attempts:  (unable to assess) Intentional Self Injurious Behavior:  (unable to assess) Family Suicide History: Unable to assess Recent stressful life event(s):  (unable to assess) Persecutory voices/beliefs?:  (unable to assess) Depression:  (unable to assess) Depression Symptoms:  (unable to assess) Substance abuse history and/or treatment for substance abuse?: No Suicide prevention information given  to non-admitted patients: Not applicable  Risk to Others Homicidal Ideation: No (pt denies) Thoughts of Harm to Others: No Current Homicidal Intent: No Current Homicidal Plan: No Access to Homicidal Means:  (unable to assess) Identified Victim:  (unable to assess) History of harm to others?:  (unable to assess) Assessment of Violence: None Noted Violent Behavior Description: unable to assess -  Does  patient have access to weapons?:  (unable to assess) Criminal Charges Pending?:  (unable to assess) Does patient have a court date:  (unable to assess)  Psychosis Hallucinations:  (unable to assess) Delusions:  (unable to assess)  Mental Status Report Appear/Hygiene: Other (Comment) (wig is askew and she refuses to take it off for RN) Eye Contact: Other (Comment) (eyes closed) Motor Activity: Unable to assess Speech: Other (Comment) (pt answer questions with one word answer or else is silent) Level of Consciousness: Other (Comment) (appears to be internally preoccupied) Mood:  (unable to assess) Affect: Blunted Anxiety Level:  (unable to assess) Thought Processes:  (unable to assess) Judgement: Impaired Orientation: Person;Place;Situation;Other (Comment) (pt thought year was 2013) Obsessive Compulsive Thoughts/Behaviors:  (unable to assess)  Cognitive Functioning Concentration:  (unable to assess) Memory:  (unable to assess) IQ:  (unable to assess) Insight:  (unable to assess) Impulse Control: Poor Appetite:  (unable to assess) Sleep:  (unable to assess) Vegetative Symptoms:  (unable to assess)  ADLScreening [facility] Services) Patient's cognitive ability adequate to safely complete daily activities?: Yes Patient able to express need for assistance with ADLs?: Yes Independently performs ADLs?: Yes (appropriate for developmental age)  Abuse/Neglect [facility]) Physical Abuse:  (unable to assess) Verbal Abuse:  (unable to assess) Sexual Abuse:  (unable to assess)  Prior Inpatient Therapy Prior Inpatient Therapy: Yes Prior Therapy Facilty/Provider(s): [facility] - 6 x most recent April 2013 Reason for Treatment: psychosis  Prior Outpatient Therapy Prior Outpatient Therapy: Yes Prior Therapy Dates: [facility]  Prior Therapy Facilty/Provider(s): dates unknown Reason for Treatment: med management  ADL Screening (condition at time of admission) Patient's cognitive ability  adequate to safely complete daily activities?: Yes Patient able to express need for assistance with ADLs?: Yes Independently performs ADLs?: Yes (appropriate for developmental age) Weakness of Legs: None Weakness of Arms/Hands: None       Abuse/Neglect Assessment (Assessment to be complete while patient is alone) Physical Abuse:  (unable to assess) Verbal Abuse:  (unable to assess) Sexual Abuse:  (unable to assess) Exploitation of patient/patient's resources:  (unable to assess) Self-Neglect:  (unable to assess) Values / Beliefs Cultural Requests During Hospitalization: None Spiritual Requests During Hospitalization: None   Advance Directives (For Healthcare) Advance Directive: Patient does not have advance directive    Additional Information 1:1 In Past 12 Months?: No CIRT Risk: No Elopement Risk: Yes Does patient have medical clearance?: Yes     Disposition:  Disposition Disposition of Patient: Inpatient treatment program (Dr Franciso Bend telepsych consult recommends inpatient)  On Site Evaluation by:   Reviewed with Physician:     Donnamarie Rossetti P 05/14/2012 5:40 AM

## 2012-05-14 NOTE — ED Notes (Signed)
Patient became agitated that staff nurse continued to come in her room and attempted to give prescribed medication. Patient is pending admission to psych inpatient for bizarre behavior. Patient has cont refused care after telepsych recommendation. Made attending ED dr. Eather Colas and patient will be IVC'd and medication will be given to asst patient in psych care needs.
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
## 2012-05-14 NOTE — BHH Counselor (Signed)
Magistrate Lovell Sheehan confirmed receipt of faxed IVC paperwork.  Evette Cristal, [place] Assessment Counselor

## 2012-05-15 DIAGNOSIS — F259 Schizoaffective disorder, unspecified: Secondary | ICD-10-CM

## 2012-05-15 NOTE — Progress Notes (Addendum)
Pt under review at [facility] per Vonna Kotyk at [facility]. [facility] will contact act regarding admission status.   Catha Gosselin, LCSWA  [phone] .05/15/2012 1841pm   Pt on waitlist at [facility] per Britta Mccreedy.   Catha Gosselin, LCSWA  [phone] .05/15/2012 22:47pm

## 2012-05-15 NOTE — ED Provider Notes (Signed)
0720.  Pt asleep in bed, NAD.  Note stable VS.  No acute overnight events reported.  She has been recommended for inpt placement.  ACT Team is actively working towards finding an appropriate facility.  Will continue to follow closely.  Tobin Chad, MD 05/15/12 [phone]

## 2012-05-15 NOTE — Consult Note (Signed)
Reason for Consult: Schizoaffective disorder, and poorly cooperative Referring Physician: Dr. Lorenso Courier  [name] is an 42 y.o. female.  HPI: Patient was known to this facility from her previous the emergency department visits. Patient was known for the schizoaffective disorder, noncompliant with her medications. Patient was brought in by emergency medical services with the chief complaints of suicidal and patient being tired. Patient was weighed, guarded, paranoid, noncooperative. Patient urine drug screen was negative for drug of abuse.  MSE: Patient was the lying down in her bed calm, pleasant, but not cooperative. Patient refuses to answer questions regarding her mood and thoughts. Safety issues. Patient has poor insight, judgment and impulse  Past Medical History  Diagnosis Date  . Depression   . Bipolar 1 disorder   . Hypertension   . Hemorrhoid   . Transfusion history   . Schizophrenia     Past Surgical History  Procedure Date  . Cesarean section     Family History  Problem Relation Age of Onset  . Diabetes type II Other     Social History:  reports that she has been smoking.  She does not have any smokeless tobacco history on file. She reports that she does not drink alcohol or use illicit drugs.  Allergies:  Allergies  Allergen Reactions  . Flagyl (Metronidazole Hcl) Nausea Only  . Sulfa Antibiotics     nausea    Medications: I have reviewed the patient's current medications.  Results for orders placed during the hospital encounter of 05/13/12 (from the past 48 hour(s))  URINE RAPID DRUG SCREEN (HOSP PERFORMED)     Status: Normal   Collection Time   05/14/12  6:44 AM      Component Value Range Comment   Opiates NONE DETECTED  NONE DETECTED    Cocaine NONE DETECTED  NONE DETECTED    Benzodiazepines NONE DETECTED  NONE DETECTED    Amphetamines NONE DETECTED  NONE DETECTED    Tetrahydrocannabinol NONE DETECTED  NONE DETECTED    Barbiturates NONE DETECTED  NONE  DETECTED     No results found.  Positive for anorexia, anxiety, bad mood, behavior problems, mood swings, sleep disturbance and Guarded and paranoid Blood pressure 118/72, pulse 64, temperature 98.3 F (36.8 C), temperature source Oral, resp. rate 20, last menstrual period 05/13/2012, SpO2 98.00%.   Assessment/Plan: Schizoaffective disorder   Recommended acute psychiatric hospitalization for safety and crisis management. Continue Risperdal 1 mg twice daily, and monitor for the adverse effects  Shaquayla Klimas,JANARDHAHA R. 05/15/2012, 6:23 PM

## 2012-05-15 NOTE — BHH Counselor (Addendum)
TC to [place] at [facility] At capacity but there will be d/c later in am. Faxed referral to [facility].  [phone] Pt denied at [facility] due to patient and unit acuity.  Evette Cristal, [place]  Assessment Counselor

## 2012-05-16 ENCOUNTER — Encounter (HOSPITAL_COMMUNITY): Payer: Self-pay

## 2012-05-16 DIAGNOSIS — Z9119 Patient's noncompliance with other medical treatment and regimen: Secondary | ICD-10-CM

## 2012-05-16 MED ORDER — BENZTROPINE MESYLATE 1 MG PO TABS
1.0000 mg | ORAL_TABLET | Freq: Two times a day (BID) | ORAL | Status: DC
  Filled 2012-05-16 (×2): qty 1

## 2012-05-16 MED ORDER — RISPERIDONE 2 MG PO TABS
2.0000 mg | ORAL_TABLET | Freq: Two times a day (BID) | ORAL | Status: DC
  Administered 2012-05-16: 2 mg via ORAL
  Filled 2012-05-16 (×2): qty 1

## 2012-05-16 NOTE — ED Notes (Signed)
Pt informed of rules regarding snacks not being given out throughout the night. Pt given a sandwich, soda, crackers and peanut butter. Pt verbalized understanding.

## 2012-05-16 NOTE — Consult Note (Signed)
Reason for Consult: Psychosis Referring Physician: Dr. Michaelle Copas is an 42 y.o. female.  HPI: Patient was seen and chart reviewed. Patient presented to the [name] emergency department via [name] from a local store. Patient reported she was confused. She was guarded, disorganized, and paranoid. Patient does not know what her medications. She first stopped going to the [name] for medication management. Patient was given Risperdal on admission. She was received one dose and than being noncompliant with medication management.  MSE: Patient was confused guarded disorganized, and paranoid. Patient has poor insight, judgment and impulse control.  Past Medical History  Diagnosis Date  . Depression   . Bipolar 1 disorder   . Hypertension   . Hemorrhoid   . Transfusion history   . Schizophrenia     Past Surgical History  Procedure Date  . Cesarean section     Family History  Problem Relation Age of Onset  . Diabetes type II Other     Social History:  reports that she has been smoking.  She does not have any smokeless tobacco history on file. She reports that she does not drink alcohol or use illicit drugs.  Allergies:  Allergies  Allergen Reactions  . Flagyl (Metronidazole Hcl) Nausea Only  . Sulfa Antibiotics     nausea    Medications: I have reviewed the patient's current medications.  No results found for this or any previous visit (from the past 48 hour(s)).  No results found.  Positive for Schizoaffective disorder, noncompliant with medication Blood pressure 118/72, pulse 64, temperature 98.3 F (36.8 C), temperature source Oral, resp. rate 20, last menstrual period 05/13/2012, SpO2 98.00%.   Assessment/Plan: Schizoaffective disorder. Noncompliant with medication.  Patient was referred to the [name] who directed today [name] wait list. Patient has been placed on  Risperdal 2 mg twice daily, and cogentin 1mg  PO BID but she has been refusing to take her medication last night and this morning.  Eliodoro Gullett,JANARDHAHA R. 05/16/2012, 3:38 PM

## 2012-05-16 NOTE — BHH Counselor (Signed)
*  Per Junious Dresser @ 05/16/12 @ 1311 patient was diverted to their waitlist. Sts that [facility] will call when bed becomes available.  Tammy Sours [phone] from [facility] is requesting a Telepsych, IVC, and Custody Order. All information received by the facility; pending review; patient is now on their waitlist.

## 2012-05-16 NOTE — ED Notes (Signed)
Pt refused VS, sts to writer that she does not her VS to be taken b/c she is sleeping.

## 2012-05-16 NOTE — ED Provider Notes (Signed)
The patient was sleeping this morning.  Vital signs are stable. We are attempting to find psychiatric placement.  Celene Kras, MD 05/16/12 [phone]

## 2012-05-17 ENCOUNTER — Encounter (HOSPITAL_COMMUNITY): Payer: Self-pay

## 2012-05-17 NOTE — ED Provider Notes (Signed)
Pt is on waiting list for [facility] and [facility].  Pt is sleeping without complaint at this time.  Gwyneth Sprout, MD 05/17/12 [phone]

## 2012-05-17 NOTE — ED Notes (Signed)
Taking shower

## 2012-05-17 NOTE — Progress Notes (Signed)
Pt states she has a pcp at  [facility] on [address] in [place] States pcp is a female but she can not recall what the name is Reports she "need to make an appointment" Last seen in March 2011 by Dr Pecola Leisure per Festus Barren in the [place] [facility] office Festus Barren states Dr Pecola Leisure will keep pt active for services for 3 years but if pt does not return to office by March 2014 she may no longer be considered an active patient in the office EPIC updated

## 2012-05-17 NOTE — BHH Counselor (Signed)
Per Junious Dresser @ 05/16/12 @ 1311 patient was diverted to the waitlist at [facility]. As of today 05/17/12 the facility was requesting the following:  Flu (current sx's) [facility] Nursing Notes *All of the above was faxed 05/17/12 @ 1114.  *On-coming staff will need to follow up with [facility] to determine patients disposition. It will also need to be determined if patient is on the wait list and what else is needed for patient to be on that list.

## 2012-05-17 NOTE — Consult Note (Signed)
Reason for Consult: Psychosis, and noncompliance Referring Physician: Dr. [name] is an 42 y.o. female.  HPI: Patient was non cooperative with staff nurse and not allowing them to take vital and and refusing to take medication prescribed to her.  MSE: She was calm and cooperative and has poor insight, judgment and impulse control.   Past Medical History  Diagnosis Date  . Depression   . Bipolar 1 disorder   . Hypertension   . Hemorrhoid   . Transfusion history   . Schizophrenia     Past Surgical History  Procedure Date  . Cesarean section     Family History  Problem Relation Age of Onset  . Diabetes type II Other     Social History:  reports that she has been smoking.  She does not have any smokeless tobacco history on file. She reports that she does not drink alcohol or use illicit drugs.  Allergies:  Allergies  Allergen Reactions  . Flagyl (Metronidazole Hcl) Nausea Only  . Sulfa Antibiotics     nausea    Medications: I have reviewed the patient's current medications.  No results found for this or any previous visit (from the past 48 hour(s)).  No results found.  Positive for anxiety, bipolar, mood swings and sleep disturbance Blood pressure 104/69, pulse 73, temperature 98.6 F (37 C), temperature source Oral, resp. rate 18, last menstrual period 05/13/2012, SpO2 99.00%.   Assessment/Plan: Schizoaffective disorder Noncompliance with treatment  Patient is waiting for pending bed at [facility]. Will encourage patient to comply with medications.   [name],Patricia R. 05/17/2012, 4:45 PM

## 2012-05-17 NOTE — ED Notes (Signed)
Pt went into bathroom to change pad, but showered before staff could stop her. Pt has been in the ED for multiple days and knows the rules for psych ED. Pt screaming in bathroom for pad, underwear, and soap. Pt given items but informed pt that she did not have to scream especially due to other pts sleeping. Pt acting belligerent and screaming in halls.

## 2012-05-18 NOTE — ED Provider Notes (Signed)
7:56 AM Patient sleeping.  Per RN, no acute events overnight.  She has been accepted for transfer to [facility].  Gerhard Munch, MD 05/18/12 [phone]

## 2012-05-18 NOTE — ED Notes (Signed)
Pt given pad.  

## 2012-05-18 NOTE — BHH Counselor (Signed)
Patient accepted to [facility] by Rosalyn Charters. The call report # is [phone]. Patient to be transported via [facility].

## 2012-05-18 NOTE — ED Notes (Signed)
Refused vitals 

## 2012-07-06 ENCOUNTER — Emergency Department (HOSPITAL_COMMUNITY)
Admission: EM | Admit: 2012-07-06 | Discharge: 2012-07-07 | Disposition: A | Discharge status: Home / Self Care | Payer: Medicaid Other | Attending: Emergency Medicine | Admitting: Emergency Medicine

## 2012-07-06 ENCOUNTER — Encounter (HOSPITAL_COMMUNITY): Payer: Self-pay | Admitting: Emergency Medicine

## 2012-07-06 DIAGNOSIS — F319 Bipolar disorder, unspecified: Secondary | ICD-10-CM | POA: Insufficient documentation

## 2012-07-06 DIAGNOSIS — Z79899 Other long term (current) drug therapy: Secondary | ICD-10-CM | POA: Insufficient documentation

## 2012-07-06 DIAGNOSIS — F209 Schizophrenia, unspecified: Secondary | ICD-10-CM | POA: Insufficient documentation

## 2012-07-06 DIAGNOSIS — N760 Acute vaginitis: Secondary | ICD-10-CM | POA: Insufficient documentation

## 2012-07-06 DIAGNOSIS — Z8679 Personal history of other diseases of the circulatory system: Secondary | ICD-10-CM | POA: Insufficient documentation

## 2012-07-06 DIAGNOSIS — I10 Essential (primary) hypertension: Secondary | ICD-10-CM | POA: Insufficient documentation

## 2012-07-06 DIAGNOSIS — R300 Dysuria: Secondary | ICD-10-CM | POA: Insufficient documentation

## 2012-07-06 DIAGNOSIS — Z3202 Encounter for pregnancy test, result negative: Secondary | ICD-10-CM | POA: Insufficient documentation

## 2012-07-06 DIAGNOSIS — F17200 Nicotine dependence, unspecified, uncomplicated: Secondary | ICD-10-CM | POA: Insufficient documentation

## 2012-07-06 LAB — URINE MICROSCOPIC-ADD ON

## 2012-07-06 LAB — URINALYSIS, ROUTINE W REFLEX MICROSCOPIC
Bilirubin Urine: NEGATIVE
Ketones, ur: NEGATIVE mg/dL
Leukocytes, UA: NEGATIVE
Nitrite: NEGATIVE
Specific Gravity, Urine: 1.031 — ABNORMAL HIGH (ref 1.005–1.030)
Urobilinogen, UA: 0.2 mg/dL (ref 0.0–1.0)
pH: 6 (ref 5.0–8.0)

## 2012-07-06 NOTE — ED Notes (Signed)
Pt states she is having vaginal discharge that is white in color  Pt states it has been going on for about a week now   Pt states she is also having urinary frequency where she feels like she needs to go and is unable   Pt states she is also having problems with her right foot

## 2012-07-06 NOTE — ED Provider Notes (Signed)
History     CSN: [id number]  Arrival date & time 07/06/12  2103   First MD Initiated Contact with Patient 07/06/12 2335      Chief Complaint  Patient presents with  . Vaginal Discharge  . Dysuria    (Consider location/radiation/quality/duration/timing/severity/associated sxs/prior treatment) HPI Comments: This is a 42 year old female, no pertinent past medical history, who presents emergency department with chief complaint of vaginal discharge. She states that she is having discharge times one week. Patient states that she has had itching. She denies having any pain. She denies dysuria, urgency, or frequency. Denies fevers, chills, nausea, and vomiting. She states that she has a new sexual partner and would like to be evaluated. he states that it is associated with She also endorses right foot pain following a fall.  The history is provided by the patient. No language interpreter was used.    Past Medical History  Diagnosis Date  . Depression   . Bipolar 1 disorder   . Hypertension   . Hemorrhoid   . Transfusion history   . Schizophrenia     Past Surgical History  Procedure Laterality Date  . Cesarean section      Family History  Problem Relation Age of Onset  . Diabetes type II Other   . Diabetes Mother     History  Substance Use Topics  . Smoking status: Current Every Day Smoker -- 0.50 packs/day for 13 years    Types: Cigarettes  . Smokeless tobacco: Not on file  . Alcohol Use: No    OB History   Grav Para Term Preterm Abortions TAB SAB Ect Mult Living                  Review of Systems  All other systems reviewed and are negative.    Allergies  Flagyl and Sulfa antibiotics  Home Medications   Current Outpatient Rx  Name  Route  Sig  Dispense  Refill  . iron polysaccharides (NIFEREX) 150 MG capsule   Oral   Take 150 mg by mouth 2 (two) times daily.         . QUEtiapine (SEROQUEL) 400 MG tablet   Oral   Take 800 mg by mouth at bedtime.            BP 139/70  Pulse 68  Temp(Src) 98.6 F (37 C) (Oral)  Resp 18  SpO2 100%  LMP 06/15/2012  Physical Exam  Nursing note and vitals reviewed. Constitutional: She is oriented to person, place, and time. She appears well-developed and well-nourished.  HENT:  Head: Normocephalic and atraumatic.  Eyes: Conjunctivae and EOM are normal. Pupils are equal, round, and reactive to light.  Neck: Normal range of motion. Neck supple.  Cardiovascular: Normal rate and regular rhythm.  Exam reveals no gallop and no friction rub.   No murmur heard. Pulmonary/Chest: Effort normal and breath sounds normal. No respiratory distress. She has no wheezes. She has no rales. She exhibits no tenderness.  Abdominal: Soft. Bowel sounds are normal. She exhibits no distension and no mass. There is no tenderness. There is no rebound and no guarding.  Genitourinary: No labial fusion. There is no rash, tenderness, lesion or injury on the right labia. There is no rash, tenderness, lesion or injury on the left labia. Cervix exhibits no motion tenderness, no discharge and no friability. Right adnexum displays no mass, no tenderness and no fullness. Left adnexum displays no mass, no tenderness and no fullness. No erythema, tenderness or  bleeding around the vagina. No foreign body around the vagina. No signs of injury around the vagina. Vaginal discharge found.  Thick white vaginal discharge, foul odor, no cervical motion tenderness  Musculoskeletal: Normal range of motion. She exhibits no edema and no tenderness.  Mild tenderness to right 5th toe  Neurological: She is alert and oriented to person, place, and time.  Skin: Skin is warm and dry.  Psychiatric: She has a normal mood and affect. Her behavior is normal. Judgment and thought content normal.    ED Course  Procedures (including critical care time)  Labs Reviewed  URINALYSIS, ROUTINE W REFLEX MICROSCOPIC - Abnormal; Notable for the following:     APPearance CLOUDY (*)    Specific Gravity, Urine 1.031 (*)    Hgb urine dipstick LARGE (*)    All other components within normal limits  URINE MICROSCOPIC-ADD ON - Abnormal; Notable for the following:    Squamous Epithelial / LPF MANY (*)    Bacteria, UA FEW (*)    All other components within normal limits  WET PREP, GENITAL  GC/CHLAMYDIA PROBE AMP  PREGNANCY, URINE   Results for orders placed during the hospital encounter of 07/06/12  WET PREP, GENITAL      Result Value Range   Yeast Wet Prep HPF POC NONE SEEN  NONE SEEN   Trich, Wet Prep NONE SEEN  NONE SEEN   Clue Cells Wet Prep HPF POC MANY (*) NONE SEEN   WBC, Wet Prep HPF POC FEW (*) NONE SEEN  URINALYSIS, ROUTINE W REFLEX MICROSCOPIC      Result Value Range   Color, Urine YELLOW  YELLOW   APPearance CLOUDY (*) CLEAR   Specific Gravity, Urine 1.031 (*) 1.005 - 1.030   pH 6.0  5.0 - 8.0   Glucose, UA NEGATIVE  NEGATIVE mg/dL   Hgb urine dipstick LARGE (*) NEGATIVE   Bilirubin Urine NEGATIVE  NEGATIVE   Ketones, ur NEGATIVE  NEGATIVE mg/dL   Protein, ur NEGATIVE  NEGATIVE mg/dL   Urobilinogen, UA 0.2  0.0 - 1.0 mg/dL   Nitrite NEGATIVE  NEGATIVE   Leukocytes, UA NEGATIVE  NEGATIVE  PREGNANCY, URINE      Result Value Range   Preg Test, Ur NEGATIVE  NEGATIVE  URINE MICROSCOPIC-ADD ON      Result Value Range   Squamous Epithelial / LPF MANY (*) RARE   RBC / HPF 3-6  <3 RBC/hpf   Bacteria, UA FEW (*) RARE   Urine-Other MUCOUS PRESENT     Dg Foot Complete Right  07/07/2012  *RADIOLOGY REPORT*  Clinical Data: Right foot pain.  No known injury.  RIGHT FOOT COMPLETE - 3+ VIEW  Comparison: 09/24/2010  Findings: The right foot appears intact. No evidence of acute fracture or subluxation.  No focal bone lesions.  Bone matrix and cortex appear intact.  No abnormal radiopaque densities in the soft tissues.  No significant change since previous study.  IMPRESSION: No acute bony abnormalities.   Original Report Authenticated By:  Burman Nieves, M.D.       1. BV (bacterial vaginosis)       MDM  42 year old female with bacterial vaginosis. Will treat with clindamycin, as patient is allergic to Flagyl. Patient is stable and ready for discharge.        Roxy Horseman, PA-C 07/07/12 0532

## 2012-07-07 ENCOUNTER — Emergency Department (HOSPITAL_COMMUNITY): Payer: Medicaid Other

## 2012-07-07 LAB — WET PREP, GENITAL
Trich, Wet Prep: NONE SEEN
Yeast Wet Prep HPF POC: NONE SEEN

## 2012-07-07 LAB — GC/CHLAMYDIA PROBE AMP
CT Probe RNA: NEGATIVE
GC Probe RNA: NEGATIVE

## 2012-07-07 MED ORDER — CLINDAMYCIN HCL 150 MG PO CAPS: 300.0000 mg | ORAL_CAPSULE | Freq: Two times a day (BID) | ORAL | Status: DC

## 2012-07-07 NOTE — ED Provider Notes (Signed)
Medical screening examination/treatment/procedure(s) were performed by non-physician practitioner and as supervising physician I was immediately available for consultation/collaboration.  Taylan Marez, MD 07/07/12 0632 

## 2012-07-07 NOTE — ED Notes (Signed)
PA at bedside with tech at this time

## 2012-08-14 ENCOUNTER — Emergency Department (INDEPENDENT_AMBULATORY_CARE_PROVIDER_SITE_OTHER)
Admission: EM | Admit: 2012-08-14 | Discharge: 2012-08-14 | Disposition: A | Discharge status: Home / Self Care | Payer: Medicaid Other | Source: Home / Self Care | Attending: Emergency Medicine | Admitting: Emergency Medicine

## 2012-08-14 ENCOUNTER — Other Ambulatory Visit (HOSPITAL_COMMUNITY)
Admission: RE | Admit: 2012-08-14 | Discharge: 2012-08-14 | Disposition: A | Discharge status: Home / Self Care | Payer: Medicaid Other | Source: Ambulatory Visit | Attending: Emergency Medicine | Admitting: Emergency Medicine

## 2012-08-14 ENCOUNTER — Encounter (HOSPITAL_COMMUNITY): Payer: Self-pay | Admitting: Emergency Medicine

## 2012-08-14 DIAGNOSIS — Z113 Encounter for screening for infections with a predominantly sexual mode of transmission: Secondary | ICD-10-CM | POA: Insufficient documentation

## 2012-08-14 DIAGNOSIS — N760 Acute vaginitis: Secondary | ICD-10-CM

## 2012-08-14 LAB — POCT URINALYSIS DIP (DEVICE)
Leukocytes, UA: NEGATIVE
Nitrite: NEGATIVE
Protein, ur: 30 mg/dL — AB
pH: 5.5 (ref 5.0–8.0)

## 2012-08-14 LAB — POCT PREGNANCY, URINE: Preg Test, Ur: NEGATIVE

## 2012-08-14 MED ORDER — CLINDAMYCIN HCL 300 MG PO CAPS: 300.0000 mg | ORAL_CAPSULE | Freq: Two times a day (BID) | ORAL | Status: DC

## 2012-08-14 NOTE — ED Provider Notes (Signed)
Chief Complaint:   Chief Complaint  Patient presents with  . Exposure to STD    History of Present Illness:   [name] is a 42 year old female who comes in today because of suspicion of STDs. She was seen at the emergency room for vaginal discharge on March 27 of this year and diagnosed with trichomonas. She was treated with clindamycin since she is allergic to Flagyl. She thought she was given a prescription for 14 pills, but only got 13, so she took one less than the usual course. Her discharge went away but then recurred again. Throughout the past several weeks she's had some discharge and odor. She denies any itching, pelvic pain, fever, chills, vomiting, or urinary symptoms. She has had slight nausea. She denies any other STD diagnosis in the past. She has recently been checked for HIV and syphilis and does not want to get this redone again. The patient is sexually active. Her partner does not have any symptoms. She's had a bilateral tubal ligation and uses condoms consistently, although states they sometimes come off or break. Her last menstrual period began April 24 is still going on right now.  Review of Systems:  Other than noted above, the patient denies any of the following symptoms: Systemic:  No fever, chills, sweats, fatigue, or weight loss. GI:  No abdominal pain, nausea, anorexia, vomiting, diarrhea, constipation, melena or hematochezia. GU:  No dysuria, frequency, urgency, hematuria, vaginal discharge, itching, or abnormal vaginal bleeding. Skin:  No rash or itching.  PMFSH:  Past medical history, family history, social history, meds, and allergies were reviewed.  She's allergic to sulfa and Flagyl. She takes Seroquel. No other medical problems.  Physical Exam:   Vital signs:  There were no vitals taken for this visit. General:  Alert, oriented and in no distress. Lungs:  Breath sounds clear and equal bilaterally.  No wheezes, rales or rhonchi. Heart:  Regular rhythm.  No  gallops or murmers. Abdomen:  Soft, flat and non-distended.  No organomegaly or mass.  No tenderness, guarding or rebound.  Bowel sounds normally active. Pelvic exam:  Normal external genitalia. Vaginal and cervical mucosa were normal. There is a small amount of brown blood in the vaginal vault. Not much discharge, no odor. No pain on cervical motion. Uterus is midposition and normal in size and shape and nontender. No adnexal masses or tenderness. Skin:  Clear, warm and dry.  Labs:   Results for orders placed during the hospital encounter of 08/14/12  POCT URINALYSIS DIP (DEVICE)      Result Value Range   Glucose, UA NEGATIVE  NEGATIVE mg/dL   Bilirubin Urine SMALL (*) NEGATIVE   Ketones, ur NEGATIVE  NEGATIVE mg/dL   Specific Gravity, Urine >=1.030  1.005 - 1.030   Hgb urine dipstick MODERATE (*) NEGATIVE   pH 5.5  5.0 - 8.0   Protein, ur 30 (*) NEGATIVE mg/dL   Urobilinogen, UA 0.2  0.0 - 1.0 mg/dL   Nitrite NEGATIVE  NEGATIVE   Leukocytes, UA NEGATIVE  NEGATIVE  POCT PREGNANCY, URINE      Result Value Range   Preg Test, Ur NEGATIVE  NEGATIVE    Additionally, DNA probes for gonorrhea, Chlamydia, Trichomonas, Candida, Gardnerella were obtained. Results are still pending and we'll call patient back for any positive results.  Assessment:  The encounter diagnosis was Vaginitis.  Probably Trichomonas, although BP also would be a possibility as well.  Plan:   1.  The following meds were prescribed:  New Prescriptions   CLINDAMYCIN (CLEOCIN) 300 MG CAPSULE    Take 1 capsule (300 mg total) by mouth 2 (two) times daily.   2.  The patient was instructed in symptomatic care and handouts were given. 3.  The patient was told to return if becoming worse in any way, if no better in 3 or 4 days, and given some red flag symptoms such as abdominal pain, fever, or vomiting that would indicate earlier return.    Reuben Likes, MD 08/14/12 [phone]

## 2012-08-14 NOTE — ED Notes (Signed)
Pt is here for poss STD Reports being seen at [facility] ED on 4/27 and dx w/Trich Was treated w/antibiotics but still having a dark discharge w/an odor to it and feeling nauseas Denies: itching, abd pain, f/v/d  She is alert and oriented w/no signs of acute distress.

## 2012-10-01 ENCOUNTER — Emergency Department (HOSPITAL_COMMUNITY): Payer: Medicaid Other

## 2012-10-01 ENCOUNTER — Encounter (HOSPITAL_COMMUNITY): Payer: Self-pay | Admitting: Emergency Medicine

## 2012-10-01 ENCOUNTER — Emergency Department (HOSPITAL_COMMUNITY)
Admission: EM | Admit: 2012-10-01 | Discharge: 2012-10-01 | Discharge status: Against Medical Advice | Payer: Medicaid Other | Attending: Emergency Medicine | Admitting: Emergency Medicine

## 2012-10-01 ENCOUNTER — Other Ambulatory Visit (HOSPITAL_COMMUNITY): Payer: Self-pay | Admitting: Emergency Medicine

## 2012-10-01 DIAGNOSIS — Z8679 Personal history of other diseases of the circulatory system: Secondary | ICD-10-CM | POA: Insufficient documentation

## 2012-10-01 DIAGNOSIS — Z3202 Encounter for pregnancy test, result negative: Secondary | ICD-10-CM | POA: Insufficient documentation

## 2012-10-01 DIAGNOSIS — N898 Other specified noninflammatory disorders of vagina: Secondary | ICD-10-CM | POA: Insufficient documentation

## 2012-10-01 DIAGNOSIS — N390 Urinary tract infection, site not specified: Secondary | ICD-10-CM

## 2012-10-01 DIAGNOSIS — F319 Bipolar disorder, unspecified: Secondary | ICD-10-CM | POA: Insufficient documentation

## 2012-10-01 DIAGNOSIS — N949 Unspecified condition associated with female genital organs and menstrual cycle: Secondary | ICD-10-CM | POA: Insufficient documentation

## 2012-10-01 DIAGNOSIS — I10 Essential (primary) hypertension: Secondary | ICD-10-CM | POA: Insufficient documentation

## 2012-10-01 DIAGNOSIS — N939 Abnormal uterine and vaginal bleeding, unspecified: Secondary | ICD-10-CM

## 2012-10-01 DIAGNOSIS — F209 Schizophrenia, unspecified: Secondary | ICD-10-CM | POA: Insufficient documentation

## 2012-10-01 DIAGNOSIS — F17200 Nicotine dependence, unspecified, uncomplicated: Secondary | ICD-10-CM | POA: Insufficient documentation

## 2012-10-01 DIAGNOSIS — R52 Pain, unspecified: Secondary | ICD-10-CM

## 2012-10-01 LAB — URINALYSIS, ROUTINE W REFLEX MICROSCOPIC
Bilirubin Urine: NEGATIVE
Ketones, ur: NEGATIVE mg/dL
Nitrite: POSITIVE — AB
Protein, ur: NEGATIVE mg/dL
Urobilinogen, UA: 0.2 mg/dL (ref 0.0–1.0)

## 2012-10-01 LAB — PREGNANCY, URINE: Preg Test, Ur: NEGATIVE

## 2012-10-01 LAB — WET PREP, GENITAL: Yeast Wet Prep HPF POC: NONE SEEN

## 2012-10-01 LAB — URINE MICROSCOPIC-ADD ON

## 2012-10-01 MED ORDER — CEPHALEXIN 500 MG PO CAPS: 500.0000 mg | ORAL_CAPSULE | Freq: Four times a day (QID) | ORAL | Status: DC

## 2012-10-01 NOTE — ED Notes (Signed)
Patient left without telling nurse. MD Effie Shy aware.

## 2012-10-01 NOTE — ED Notes (Signed)
Patient transported to Ultrasound 

## 2012-10-01 NOTE — ED Notes (Signed)
Patient returned saying that she went outside to smoke a cigarette and has been sitting in the lobby.

## 2012-10-01 NOTE — ED Notes (Signed)
Bed:WA22<BR> Expected date:<BR> Expected time:<BR> Means of arrival:<BR> Comments:<BR>

## 2012-10-01 NOTE — ED Provider Notes (Signed)
History     CSN: [id number]  Arrival date & time 10/01/12  4098   First MD Initiated Contact with Patient 10/01/12 [phone]      Chief Complaint  Patient presents with  . Vaginal Bleeding    (Consider location/radiation/quality/duration/timing/severity/associated sxs/prior treatment) HPI Comments: [name] is a 42 y.o. female who states that she explore her vagina with a Q-tip 2 weeks ago. She was time to ascertain that she had an infection. She believes that portion of the tip of the Q-tip fell off. Since that time, she has had pelvicpain, vaginal bleeding, and a discharge. Her last menstrual period was 09/17/12. She denies problems urinating, bowel movements , changes, nausea, or vomiting, fever, or chills. There are no known modifying factors.  Patient is a 42 y.o. female presenting with vaginal bleeding. The history is provided by the patient.  Vaginal Bleeding   Past Medical History  Diagnosis Date  . Depression   . Bipolar 1 disorder   . Hypertension   . Hemorrhoid   . Transfusion history   . Schizophrenia     Past Surgical History  Procedure Laterality Date  . Cesarean section      Family History  Problem Relation Age of Onset  . Diabetes type II Other   . Diabetes Mother     History  Substance Use Topics  . Smoking status: Current Every Day Smoker -- 0.50 packs/day for 13 years    Types: Cigarettes  . Smokeless tobacco: Not on file  . Alcohol Use: No    OB History   Grav Para Term Preterm Abortions TAB SAB Ect Mult Living                  Review of Systems  Genitourinary: Positive for vaginal bleeding.  All other systems reviewed and are negative.    Allergies  Flagyl and Sulfa antibiotics  Home Medications   Current Outpatient Rx  Name  Route  Sig  Dispense  Refill  . Aspirin-Caffeine 845-65 MG PACK   Oral   Take 1 Package by mouth 2 (two) times daily as needed (headache or pain).         Marland Kitchen lurasidone (LATUDA) 40 MG TABS   Oral    Take 40 mg by mouth at bedtime.           BP 108/82  Pulse 56  Temp(Src) 98.7 F (37.1 C) (Oral)  Resp 16  SpO2 97%  LMP 09/17/2012  Physical Exam  Nursing note and vitals reviewed. Constitutional: She is oriented to person, place, and time. She appears well-developed and well-nourished.  HENT:  Head: Normocephalic and atraumatic.  Eyes: Conjunctivae and EOM are normal. Pupils are equal, round, and reactive to light.  Neck: Normal range of motion and phonation normal. Neck supple.  Cardiovascular: Normal rate, regular rhythm and intact distal pulses.   Pulmonary/Chest: Effort normal and breath sounds normal. She exhibits no tenderness.  Abdominal: Soft. She exhibits no distension. There is no tenderness. There is no guarding.  Genitourinary:  NEFG. Small amt. Vaginal bleeding. No visualized or palpable vaginal FB. Cx closed. Mild mid-line pelvic tenderness. No adenexal mass or tenderness.   Musculoskeletal: Normal range of motion.  Neurological: She is alert and oriented to person, place, and time. She has normal strength. She exhibits normal muscle tone.  Skin: Skin is warm and dry.  Psychiatric: She has a normal mood and affect. Her behavior is normal. Judgment and thought content normal.  ED Course  Procedures (including critical care time)   Reevaluation with update and discussion. After initial assessment and treatment, an updated evaluation reveals U/S report not back yet. Pt has left AMA without telling anyone. No treatment started in ED. Urine Culture ordered. Cordae Mccarey L    Labs Reviewed  URINALYSIS, ROUTINE W REFLEX MICROSCOPIC - Abnormal; Notable for the following:    APPearance CLOUDY (*)    Hgb urine dipstick SMALL (*)    Nitrite POSITIVE (*)    Leukocytes, UA TRACE (*)    All other components within normal limits  URINE MICROSCOPIC-ADD ON - Abnormal; Notable for the following:    Bacteria, UA MANY (*)    All other components within normal limits   WET PREP, GENITAL  URINE CULTURE  GC/CHLAMYDIA PROBE AMP  URINE CULTURE  PREGNANCY, URINE      1. Vaginal bleeding   2. UTI (lower urinary tract infection)       MDM  Nonspecific vaginal bleeding. Patient is not pregnant. No evidence for vaginal foreign body, on exam. Unfortunately, the patient left prior to receiving treatment or discussion of her ultrasound result.          Flint Melter, MD 10/01/12 [phone]

## 2012-10-01 NOTE — ED Notes (Signed)
Pt reports she went outside to smoke a cigarette, came back to the front window to ask about her results. Charge informed pt there is a no smoking policy and pts are not suppose to leave their rooms and exit the building. Pt requesting results from today. Charge called Effie Shy and Effie Shy will speak to pt about results. Pt waiting in consult room.

## 2012-10-01 NOTE — ED Notes (Signed)
Pt arrived to the ED via EMS with the complaint of a foreign body lodged in her vagina.  Pt was diagnosed with a vaginal bacterial infection 1.5 months ago and was attempting to see what if any discharge was present.  Pt states that the Qtip was lost in her vagina two weeks ago.  Pt states she believes she saw some matter come out but believes that the stick part of the qtip is still currently lodge in her vagina.  Pt also is complaining of discomfort and vaginal bleeding.

## 2012-10-02 ENCOUNTER — Other Ambulatory Visit (HOSPITAL_COMMUNITY): Payer: Self-pay

## 2012-10-02 LAB — GC/CHLAMYDIA PROBE AMP: CT Probe RNA: NEGATIVE

## 2012-10-03 LAB — URINE CULTURE

## 2012-10-04 NOTE — ED Notes (Signed)
Post ED Visit - Positive Culture Follow-up  Culture report reviewed by antimicrobial stewardship pharmacist: []  Wes Dulaney, Pharm.D., BCPS []  Celedonio Miyamoto, Pharm.D., BCPS [x]  Georgina Pillion, [address].D., BCPS []  [place], [place].D., BCPS, AAHIVP []  Estella Husk, Pharm.D., BCPS, AAHIVP  Positive urine culture Treated with Cephalexin organism sensitive to the same and no further patient follow-up is required at this time.  Larena Sox 10/04/2012, 2:49 PM

## 2012-11-10 ENCOUNTER — Encounter (HOSPITAL_COMMUNITY): Payer: Self-pay | Admitting: *Deleted

## 2012-11-10 ENCOUNTER — Emergency Department (HOSPITAL_COMMUNITY)
Admission: EM | Admit: 2012-11-10 | Discharge: 2012-11-13 | Disposition: A | Discharge status: Home / Self Care | Payer: Medicaid Other | Attending: Emergency Medicine | Admitting: Emergency Medicine

## 2012-11-10 DIAGNOSIS — F250 Schizoaffective disorder, bipolar type: Secondary | ICD-10-CM

## 2012-11-10 DIAGNOSIS — R443 Hallucinations, unspecified: Secondary | ICD-10-CM | POA: Insufficient documentation

## 2012-11-10 DIAGNOSIS — F22 Delusional disorders: Secondary | ICD-10-CM | POA: Insufficient documentation

## 2012-11-10 DIAGNOSIS — F17200 Nicotine dependence, unspecified, uncomplicated: Secondary | ICD-10-CM | POA: Insufficient documentation

## 2012-11-10 DIAGNOSIS — IMO0002 Reserved for concepts with insufficient information to code with codable children: Secondary | ICD-10-CM | POA: Insufficient documentation

## 2012-11-10 DIAGNOSIS — I10 Essential (primary) hypertension: Secondary | ICD-10-CM | POA: Insufficient documentation

## 2012-11-10 DIAGNOSIS — F329 Major depressive disorder, single episode, unspecified: Secondary | ICD-10-CM

## 2012-11-10 DIAGNOSIS — R440 Auditory hallucinations: Secondary | ICD-10-CM

## 2012-11-10 DIAGNOSIS — Z3202 Encounter for pregnancy test, result negative: Secondary | ICD-10-CM | POA: Insufficient documentation

## 2012-11-10 DIAGNOSIS — F259 Schizoaffective disorder, unspecified: Secondary | ICD-10-CM | POA: Insufficient documentation

## 2012-11-10 DIAGNOSIS — Z79899 Other long term (current) drug therapy: Secondary | ICD-10-CM | POA: Insufficient documentation

## 2012-11-10 DIAGNOSIS — F411 Generalized anxiety disorder: Secondary | ICD-10-CM | POA: Insufficient documentation

## 2012-11-10 DIAGNOSIS — Z8679 Personal history of other diseases of the circulatory system: Secondary | ICD-10-CM | POA: Insufficient documentation

## 2012-11-10 DIAGNOSIS — F319 Bipolar disorder, unspecified: Secondary | ICD-10-CM | POA: Insufficient documentation

## 2012-11-10 NOTE — ED Notes (Addendum)
Pt has in belonging bag brown scarf, pink laced bra, white and black poker dots undershirt, white and pink underwear, multi colored bracelets, and a brown purse. Pt's belonging locked up in locker #28 in [place]

## 2012-11-10 NOTE — ED Provider Notes (Signed)
CSN: [id number]     Arrival date & time 11/10/12  2321 History     First MD Initiated Contact with Patient 11/10/12 2338     Chief Complaint  Patient presents with  . Altered Mental Status  . Paranoid   (Consider location/radiation/quality/duration/timing/severity/associated sxs/prior Treatment) HPI Comments: Patient is a 42 year old female with a history of depression, schizophrenia, and anxiety as well as bipolar 1 disorder who presents for auditory hallucinations. Patient states she was changed to 40 mg each bedtime one month ago. 3 weeks ago, however, patient states that she spilled her medication in her purse and has been able to find it sounds. She has been taking her medication on and off as she finds her pills. Patient states that she has been having more persistent auditory elucidations. She states that the voices are telling her "mean things" about herself. She denies any visual hallucinations, suicidal ideations, or homicidal ideations. She denies any alcohol or illicit drug use in the last week. Patient followed by [facility]ry at [facility]. She is requesting placement in a behavioral health facility. She denies any pain or other medical complaints.  The history is provided by the patient. No language interpreter was used.    Past Medical History  Diagnosis Date  . Depression   . Bipolar 1 disorder   . Hypertension   . Hemorrhoid   . Transfusion history   . Schizophrenia    Past Surgical History  Procedure Laterality Date  . Cesarean section     Family History  Problem Relation Age of Onset  . Diabetes type II Other   . Diabetes Mother    History  Substance Use Topics  . Smoking status: Current Every Day Smoker -- 0.50 packs/day for 13 years    Types: Cigarettes  . Smokeless tobacco: Not on file  . Alcohol Use: No   OB History   Grav Para Term Preterm Abortions TAB SAB Ect Mult Living                 Review of Systems  Constitutional: Negative for fever.   Psychiatric/Behavioral: Positive for hallucinations and agitation. Negative for suicidal ideas. The patient is nervous/anxious.   All other systems reviewed and are negative.   Allergies  Flagyl and Sulfa antibiotics  Home Medications   Current Outpatient Rx  Name  Route  Sig  Dispense  Refill  . lurasidone (LATUDA) 40 MG TABS   Oral   Take 40 mg by mouth at bedtime.          BP 128/81  Pulse 77  Temp(Src) 98.7 F (37.1 C) (Oral)  Resp 18  SpO2 98%  Physical Exam  Nursing note and vitals reviewed. Constitutional: She is oriented to person, place, and time. She appears well-developed and well-nourished. No distress.  HENT:  Head: Normocephalic and atraumatic.  Mouth/Throat: Oropharynx is clear and moist. No oropharyngeal exudate.  Eyes: Conjunctivae and EOM are normal. Pupils are equal, round, and reactive to light. No scleral icterus.  Neck: Normal range of motion.  Cardiovascular: Normal rate, regular rhythm and normal heart sounds.   Pulmonary/Chest: Effort normal and breath sounds normal. No respiratory distress. She has no wheezes. She has no rales.  Abdominal: Soft. Bowel sounds are normal. She exhibits no distension. There is no tenderness. There is no rebound and no guarding.  Musculoskeletal: Normal range of motion.  Neurological: She is alert and oriented to person, place, and time.  Skin: Skin is warm and dry. No rash noted.  She is not diaphoretic. No erythema. No pallor.  Psychiatric: Her speech is normal. Her mood appears anxious (mild). Thought content is paranoid. Cognition and memory are normal. She expresses no homicidal and no suicidal ideation. She expresses no suicidal plans and no homicidal plans.   ED Course   Procedures (including critical care time)  Labs Reviewed  CBC WITH DIFFERENTIAL - Abnormal; Notable for the following:    RBC 3.74 (*)    Hemoglobin 10.3 (*)    HCT 31.3 (*)    RDW 16.6 (*)    All other components within normal limits   URINE RAPID DRUG SCREEN (HOSP PERFORMED) - Abnormal; Notable for the following:    Tetrahydrocannabinol POSITIVE (*)    All other components within normal limits  SALICYLATE LEVEL - Abnormal; Notable for the following:    Salicylate Lvl <2.0 (*)    All other components within normal limits  ACETAMINOPHEN LEVEL  GLUCOSE, CAPILLARY  COMPREHENSIVE METABOLIC PANEL  ETHANOL  POCT PREGNANCY, URINE    Date: 11/11/2012  Rate: 70  Rhythm: normal sinus rhythm  QRS Axis: normal  Intervals: normal  ST/T Wave abnormalities: normal  Conduction Disutrbances:none  Narrative Interpretation: NSR; no STEMI or ischemic changes  Old EKG Reviewed: unchanged from 02/24/2011 I have personally reviewed and interpreted this EKG  No results found.  1. Auditory hallucinations   2. Anxiety and depression   3. Schizoaffective disorder, bipolar type    MDM  Patient with a history of anxiety, depression, schizophrenia, and bipolar disorder presents for auditory hallucinations x3 weeks. Patient has been noncompliant with her Kasandra Knudsen as she states it fell out of her purse; she takes her medication as she finds it. Patient denies any pain or medical complaints. She denies suicidal or homicidal ideations. She presents voluntarily. Patient is medically cleared. I spoke with Aurther Loft of the ACT team who will see and evaluate patient for placement in a behavioral health facility which patient is requesting.       Antony Madura, PA-C 11/11/12 [phone]

## 2012-11-10 NOTE — ED Notes (Signed)
Pt says she was walking home and became confused as to where she was, admits not taking psych meds x 3 weeks. Pt says she is feeling paranoid and feels like someone is trying to kill her. Pt hearing voices and says they are telling her " negative things". Denies any homicidal or suicidal thoughts

## 2012-11-11 ENCOUNTER — Encounter (HOSPITAL_COMMUNITY): Payer: Self-pay

## 2012-11-11 DIAGNOSIS — F29 Unspecified psychosis not due to a substance or known physiological condition: Secondary | ICD-10-CM

## 2012-11-11 DIAGNOSIS — F319 Bipolar disorder, unspecified: Secondary | ICD-10-CM

## 2012-11-11 LAB — COMPREHENSIVE METABOLIC PANEL
ALT: 9 U/L (ref 0–35)
AST: 17 U/L (ref 0–37)
Albumin: 3.8 g/dL (ref 3.5–5.2)
Alkaline Phosphatase: 68 U/L (ref 39–117)
Potassium: 3.6 mEq/L (ref 3.5–5.1)
Sodium: 135 mEq/L (ref 135–145)
Total Protein: 7.8 g/dL (ref 6.0–8.3)

## 2012-11-11 LAB — ACETAMINOPHEN LEVEL: Acetaminophen (Tylenol), Serum: <15 ug/mL (ref 10–30)

## 2012-11-11 LAB — CBC WITH DIFFERENTIAL/PLATELET
Basophils Relative: 0 % (ref 0–1)
Eosinophils Absolute: 0.1 10*3/uL (ref 0.0–0.7)
Lymphs Abs: 3.7 10*3/uL (ref 0.7–4.0)
MCH: 27.5 pg (ref 26.0–34.0)
MCHC: 32.9 g/dL (ref 30.0–36.0)
Neutrophils Relative %: 53 % (ref 43–77)
Platelets: 291 10*3/uL (ref 150–400)
RBC: 3.74 MIL/uL — ABNORMAL LOW (ref 3.87–5.11)

## 2012-11-11 LAB — RAPID URINE DRUG SCREEN, HOSP PERFORMED
Amphetamines: NOT DETECTED
Tetrahydrocannabinol: POSITIVE — AB

## 2012-11-11 LAB — ETHANOL: Alcohol, Ethyl (B): <11 mg/dL (ref 0–11)

## 2012-11-11 MED ORDER — IBUPROFEN 200 MG PO TABS: 600.0000 mg | ORAL_TABLET | Freq: Three times a day (TID) | ORAL | Status: DC | PRN

## 2012-11-11 MED ORDER — LORAZEPAM 1 MG PO TABS
1.0000 mg | ORAL_TABLET | Freq: Three times a day (TID) | ORAL | Status: DC | PRN
  Administered 2012-11-11 (×2): 1 mg via ORAL
  Filled 2012-11-11 (×2): qty 1

## 2012-11-11 MED ORDER — NYSTATIN 100000 UNIT/GM EX POWD
Freq: Two times a day (BID) | CUTANEOUS | Status: DC
  Administered 2012-11-12: 10:00:00 via TOPICAL
  Filled 2012-11-11: qty 15

## 2012-11-11 MED ORDER — ZOLPIDEM TARTRATE 5 MG PO TABS
5.0000 mg | ORAL_TABLET | Freq: Every evening | ORAL | Status: DC | PRN
  Administered 2012-11-11 (×2): 5 mg via ORAL
  Filled 2012-11-11 (×2): qty 1

## 2012-11-11 MED ORDER — ALUM & MAG HYDROXIDE-SIMETH 200-200-20 MG/5ML PO SUSP: 30.0000 mL | ORAL | Status: DC | PRN

## 2012-11-11 MED ORDER — LURASIDONE HCL 40 MG PO TABS
40.0000 mg | ORAL_TABLET | Freq: Every day | ORAL | Status: DC
  Administered 2012-11-12: 40 mg via ORAL
  Filled 2012-11-11 (×4): qty 1

## 2012-11-11 MED ORDER — ONDANSETRON HCL 4 MG PO TABS: 4.0000 mg | ORAL_TABLET | Freq: Three times a day (TID) | ORAL | Status: DC | PRN

## 2012-11-11 MED ORDER — ACETAMINOPHEN 325 MG PO TABS: 650.0000 mg | ORAL_TABLET | ORAL | Status: DC | PRN

## 2012-11-11 MED ORDER — NICOTINE 21 MG/24HR TD PT24
21.0000 mg | MEDICATED_PATCH | Freq: Every day | TRANSDERMAL | Status: DC
  Filled 2012-11-11: qty 1

## 2012-11-11 MED ORDER — NYSTATIN 100000 UNIT/GM EX CREA
TOPICAL_CREAM | Freq: Two times a day (BID) | CUTANEOUS | Status: DC
  Filled 2012-11-11: qty 15

## 2012-11-11 NOTE — BH Assessment (Signed)
Assessment Note   [name] is a 42 y.o. female who presents to [facility] voluntarily with c/o auditory hallucinations and non-compliance with psychotropic medications.  Pt denies SI/HI.  Pt reports the following: states she was walking home and became confused with her location.  Pt is paranoid, feeling like someone is trying to kill her.  Pt says voices are telling bad things , but denies they are commanding her to harm herself.  Pt tells this Clinical research associate that she cannot contract for safety.  Pt hs current outpatient services with [facility] and has been off her medications for approx 3 weeks for reasons unk.  Pt admits to self medicating with THC to help with the voices and to calm her down.  Pt says she uses 1 blunt wkly--seems to minimize use.  Pt denies any other drug use.  Pt has past inpt admission with [facility] in 2010 for similar issues.  Pt is pending am psych eval by on coming psychiatrist to complete disposition.    Axis I: Bipolar D/O w/psych features  Axis II: Deferred Axis III:  Past Medical History  Diagnosis Date  . Depression   . Bipolar 1 disorder   . Hypertension   . Hemorrhoid   . Transfusion history   . Schizophrenia    Axis IV: other psychosocial or environmental problems, problems related to social environment and problems with primary support group Axis V: 21-30 behavior considerably influenced by delusions or hallucinations OR serious impairment in judgment, communication OR inability to function in almost all areas  Past Medical History:  Past Medical History  Diagnosis Date  . Depression   . Bipolar 1 disorder   . Hypertension   . Hemorrhoid   . Transfusion history   . Schizophrenia     Past Surgical History  Procedure Laterality Date  . Cesarean section      Family History:  Family History  Problem Relation Age of Onset  . Diabetes type II Other   . Diabetes Mother     Social History:  reports that she has been smoking Cigarettes.  She has a 6.5 pack-year  smoking history. She does not have any smokeless tobacco history on file. She reports that she uses illicit drugs (Marijuana). She reports that she does not drink alcohol.  Additional Social History:  Alcohol / Drug Use Pain Medications: None  Prescriptions: See MAR  Over the Counter: None  History of alcohol / drug use?: Yes Longest period of sobriety (when/how long): None  Substance #1 Name of Substance 1: THC  1 - Age of First Use: Unk 1 - Amount (size/oz): 1 Blunt  1 - Frequency: Wkly  1 - Duration: On-going  1 - Last Use / Amount: Unk   CIWA: CIWA-Ar BP: 111/80 mmHg Pulse Rate: 61 COWS:    Allergies:  Allergies  Allergen Reactions  . Flagyl (Metronidazole Hcl) Nausea Only  . Sulfa Antibiotics Nausea Only    nausea    Home Medications:  (Not in a hospital admission)  OB/GYN Status:  No LMP recorded.  General Assessment Data [facility]: [facility] Living Arrangements: Alone Can pt return to current living arrangement?: Yes Admission Status: Voluntary Is patient capable of signing voluntary admission?: Yes Transfer from: Acute Hospital Referral Source: MD  Education Status Is patient currently in school?: No Current Grade: None  Highest grade of school patient has completed: None  Name of school: None  Contact person: None   Risk to self Suicidal Ideation: No Suicidal  Intent: No Is patient at risk for suicide?: No Suicidal Plan?: No Access to Means: No What has been your use of drugs/alcohol within the last 12 months?: Abusing: THC  Previous Attempts/Gestures: No How many times?: 0 Other Self Harm Risks: None  Triggers for Past Attempts: None known Intentional Self Injurious Behavior: None Family Suicide History: No Recent stressful life event(s): Other (Comment) (Off meds x3 wks ) Persecutory voices/beliefs?: No Depression: Yes Depression Symptoms: Loss of interest in usual pleasures;Feeling worthless/self pity;Feeling  angry/irritable Substance abuse history and/or treatment for substance abuse?: Yes Suicide prevention information given to non-admitted patients: Not applicable  Risk to Others Homicidal Ideation: No Thoughts of Harm to Others: No Current Homicidal Intent: No Current Homicidal Plan: No Access to Homicidal Means: No Identified Victim: None  History of harm to others?: No Assessment of Violence: None Noted Violent Behavior Description: None  Does patient have access to weapons?: No Criminal Charges Pending?: No Does patient have a court date: No  Psychosis Hallucinations: Auditory Delusions: Unspecified  Mental Status Report Appear/Hygiene: Disheveled Eye Contact: Poor Motor Activity: Unremarkable Speech: Soft;Slow;Slurred Level of Consciousness: Drowsy Mood: Depressed Affect: Depressed Anxiety Level: None Thought Processes: Coherent;Relevant Judgement: Impaired Orientation: Person;Place;Time;Situation Obsessive Compulsive Thoughts/Behaviors: None  Cognitive Functioning Concentration: Normal Memory: Recent Intact;Remote Intact IQ: Average Insight: Poor Impulse Control: Poor Appetite: Good Weight Loss: 0 Weight Gain: 0 Sleep: Decreased Total Hours of Sleep: 4 Vegetative Symptoms: None  ADLScreening [facility] Assessment Services) Patient's cognitive ability adequate to safely complete daily activities?: Yes Patient able to express need for assistance with ADLs?: Yes Independently performs ADLs?: Yes (appropriate for developmental age)  Abuse/Neglect [facility]) Physical Abuse: Denies Verbal Abuse: Denies Sexual Abuse: Denies  Prior Inpatient Therapy Prior Inpatient Therapy: Yes Prior Therapy Dates: 2010 Prior Therapy Facilty/Provider(s): [facility]  Reason for Treatment: Psychosis   Prior Outpatient Therapy Prior Outpatient Therapy: Yes Prior Therapy Dates: Current  Prior Therapy Facilty/Provider(s): [facility]  Reason for Treatment: Med Mgt   ADL  Screening (condition at time of admission) Patient's cognitive ability adequate to safely complete daily activities?: Yes Is the patient deaf or have difficulty hearing?: No Does the patient have difficulty seeing, even when wearing glasses/contacts?: No Does the patient have difficulty concentrating, remembering, or making decisions?: No Patient able to express need for assistance with ADLs?: Yes Does the patient have difficulty dressing or bathing?: No Independently performs ADLs?: Yes (appropriate for developmental age) Does the patient have difficulty walking or climbing stairs?: No Weakness of Legs: None Weakness of Arms/Hands: None  Home Assistive Devices/Equipment Home Assistive Devices/Equipment: None  Therapy Consults (therapy consults require a physician order) PT Evaluation Needed: No OT Evalulation Needed: No SLP Evaluation Needed: No Abuse/Neglect Assessment (Assessment to be complete while patient is alone) Physical Abuse: Denies Verbal Abuse: Denies Sexual Abuse: Denies Exploitation of patient/patient's resources: Denies Values / Beliefs Cultural Requests During Hospitalization: None Spiritual Requests During Hospitalization: None Consults Spiritual Care Consult Needed: No Social Work Consult Needed: No Merchant navy officer (For Healthcare) Advance Directive: Patient does not have advance directive;Patient would not like information Pre-existing out of facility DNR order (yellow form or pink MOST form): No Nutrition Screen- [facility] Patient's home diet: Regular  Additional Information 1:1 In Past 12 Months?: No CIRT Risk: No Elopement Risk: No Does patient have medical clearance?: Yes     Disposition:  Disposition Initial Assessment Completed for this Encounter: Yes Disposition of Patient: Inpatient treatment program;Referred to [facility] ) Type of inpatient treatment program: Adult Patient referred  to: Other (Comment) [facility] )  On Site Evaluation by:    Reviewed with Physician:     Murrell Redden 11/11/2012 7:29 AM

## 2012-11-11 NOTE — ED Notes (Signed)
Report given to Psych ED . Patient made aware she was eing moved. Security has wanded belongings and patient. Patient in blue scrubs
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
## 2012-11-11 NOTE — ED Provider Notes (Signed)
Medical screening examination/treatment/procedure(s) were performed by non-physician practitioner and as supervising physician I was immediately available for consultation/collaboration.  Mamie Hundertmark M Abigael Mogle, MD 11/11/12 0715 
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
## 2012-11-11 NOTE — ED Notes (Signed)
Pt complaining of burning and itching under her breasts. She showed areas to Clinical research associate and it appears to be chaffing and maybe a little yeast infection she said she's been seen by a doctor and was given some medicated powder to use but doesn't have it with her now. Tried to call EDP but phone rolling over.

## 2012-11-12 ENCOUNTER — Encounter (HOSPITAL_COMMUNITY): Payer: Self-pay | Admitting: Registered Nurse

## 2012-11-12 NOTE — ED Notes (Signed)
Pt denies SI/HI/AVH.  Although pt is currently denying AH, she states that she has heard them earlier today.  Pt is guarded and forwards little.

## 2012-11-12 NOTE — Progress Notes (Signed)
Kailyn D [id number]  Subjective I am still hearing voices. I cannot sleep.  Objective Patient seen chart reviewed.  Continued to endorse suicidal thoughts and auditory hallucinations.  She could not sleep last night.  Restarted her medication latuda, patient denies any side effects.  She has no tremors or shakes.    Mental Status Examination Patient appears to be on her stated age.  Her speech is slow .  She has thought blocking.  She continued to endorse auditory hallucination and suicidal thoughts and cannot contract for safety.  There were no tremors or shakes.  Her attention and concentration is poor.  She has paranoia and delusion that people are talking about her.  Her insight judgment and impulse control is limited.     Current Medication Current facility-administered medications:acetaminophen (TYLENOL) tablet 650 mg, 650 mg, Oral, Q4H PRN, Antony Madura, PA-C;  alum & mag hydroxide-simeth (MAALOX/MYLANTA) 200-200-20 MG/5ML suspension 30 mL, 30 mL, Oral, PRN, Antony Madura, PA-C;  ibuprofen (ADVIL,MOTRIN) tablet 600 mg, 600 mg, Oral, Q8H PRN, Antony Madura, PA-C;  LORazepam (ATIVAN) tablet 1 mg, 1 mg, Oral, Q8H PRN, Antony Madura, PA-C, 1 mg at 11/11/12 2251 lurasidone (LATUDA) tablet 40 mg, 40 mg, Oral, Q breakfast, Earney Navy, NP, 40 mg at 11/12/12 0949;  nicotine (NICODERM CQ - dosed in mg/24 hours) patch 21 mg, 21 mg, Transdermal, Daily, Antony Madura, PA-C;  nystatin (MYCOSTATIN/NYSTOP) topical powder, , Topical, BID, Suzi Roots, MD;  ondansetron [facility]) tablet 4 mg, 4 mg, Oral, Q8H PRN, Antony Madura, PA-C zolpidem (AMBIEN) tablet 5 mg, 5 mg, Oral, QHS PRN, Antony Madura, PA-C, 5 mg at 11/11/12 2251 Current outpatient prescriptions:lurasidone (LATUDA) 40 MG TABS, Take 40 mg by mouth at bedtime., Disp: , Rfl:    Assessment Axis IBipolar disorder with psychotic features  Axis II deferred Axis III see medical history  Axis IV moderate  Plan We are waiting for bed for  inpatient psychiatric services.  We will followup with the social worker and ACT.  Patient requires inpatient psychiatric services.

## 2012-11-13 MED ORDER — ARIPIPRAZOLE 10 MG PO TABS: 10.0000 mg | ORAL_TABLET | Freq: Every day | ORAL | Status: DC

## 2012-11-13 MED ORDER — ARIPIPRAZOLE 10 MG PO TABS
10.0000 mg | ORAL_TABLET | Freq: Every day | ORAL | Status: DC
  Filled 2012-11-13: qty 1

## 2012-11-13 MED ORDER — ARIPIPRAZOLE 5 MG PO TABS: 5.0000 mg | ORAL_TABLET | Freq: Two times a day (BID) | ORAL | Status: DC

## 2012-11-13 NOTE — [facility] Counselor (Addendum)
Spoke to staff-Kasie at [facility] regarding patient's follow up. Per staff pt will need to present to [facility] before 3pm today and will be seen today. Informed Shary Key that patient that he will need ACT "wrap around services". She made a note and will make arrangements for patient to see a provider today regarding ACT services, medication management, therapy, etc.   Pt discharged  to follow up with [facility] today before 3pm.

## 2012-11-13 NOTE — [facility] Counselor (Signed)
Sinou at [facility] says they have one female bed left. Writer faxed referral.  Patsy at [facility] - no beds.  Evette Cristal, [place] Assessment Counselor

## 2012-11-13 NOTE — Progress Notes (Signed)
Patient Identification:  [name] Date of Evaluation:  11/13/2012   History of Present Illness ;Voluntarily come in over the weekend seeking treatment for auditory hallucination and paranoia.  She reported not taking her medications Latuda for 3 week because she lost them.  She has done well since her arrival to [facility] and restarting her Jordan.  However, she refused today's morning dose because her blood pressure is getting lower than usual.  She is switched over to Abilify 10 mg QHS after discussing with Dr Lucianne Muss.  Patient states she has tried Abilify in the past and she had no problems from it.  She is engag with her post care and outpatient plan.  She is pleasant and cooperative.  Past Psychiatric History: Bipolar disorder with psychotic features     Past Medical History:     Past Medical History  Diagnosis Date  . Depression   . Bipolar 1 disorder   . Hypertension   . Hemorrhoid   . Transfusion history   . Schizophrenia        Past Surgical History  Procedure Laterality Date  . Cesarean section      Allergies:  Allergies  Allergen Reactions  . Flagyl (Metronidazole Hcl) Nausea Only  . Sulfa Antibiotics Nausea Only    nausea    Current Medications:  Prior to Admission medications   Medication Sig Start Date End Date Taking? Authorizing Provider  lurasidone (LATUDA) 40 MG TABS Take 40 mg by mouth at bedtime.   Yes Historical Provider, MD    Social History:    reports that she has been smoking Cigarettes.  She has a 6.5 pack-year smoking history. She does not have any smokeless tobacco history on file. She reports that she uses illicit drugs (Marijuana). She reports that she does not drink alcohol.   Family History:    Family History  Problem Relation Age of Onset  . Diabetes type II Other   . Diabetes Mother     Mental Status Examination/Evaluation: She is awake, alert and oriented x3.  She answers questions promptly and she maintains good eye contact.  Her  thought process and content are wnl,  She denies SI/HI/AVH, She does not have paranoia or delusion and her concentration is good.     DIAGNOSIS:   AXIS I   Bipolar disorder with psychotic features    AXIS II  Deffered  AXIS III See medical notes.  AXIS IV housing problems, other psychosocial or environmental problems and problems related to social environment  AXIS V 61-70 mild symptoms     Assessment/Plan:  Consult with Dr Lucianne Muss and face to face interview PLAN:  D/C Latuda and start Abilify 10 mg po QHS D/C home today with outpatient follow up with [facility].

## 2012-11-14 NOTE — Progress Notes (Signed)
Patient seen, evaluated and recommendations made by me 

## 2012-12-18 ENCOUNTER — Emergency Department (HOSPITAL_COMMUNITY)
Admission: EM | Admit: 2012-12-18 | Discharge: 2012-12-21 | Disposition: A | Discharge status: Home / Self Care | Payer: Medicaid Other | Attending: Emergency Medicine | Admitting: Emergency Medicine

## 2012-12-18 ENCOUNTER — Encounter (HOSPITAL_COMMUNITY): Payer: Self-pay | Admitting: Emergency Medicine

## 2012-12-18 ENCOUNTER — Ambulatory Visit (HOSPITAL_COMMUNITY)
Admission: RE | Admit: 2012-12-18 | Discharge: 2012-12-18 | Disposition: A | Discharge status: Home / Self Care | Payer: Medicaid Other | Attending: Psychiatry | Admitting: Psychiatry

## 2012-12-18 DIAGNOSIS — R45851 Suicidal ideations: Secondary | ICD-10-CM | POA: Insufficient documentation

## 2012-12-18 DIAGNOSIS — Z3202 Encounter for pregnancy test, result negative: Secondary | ICD-10-CM | POA: Insufficient documentation

## 2012-12-18 DIAGNOSIS — I10 Essential (primary) hypertension: Secondary | ICD-10-CM | POA: Insufficient documentation

## 2012-12-18 DIAGNOSIS — F250 Schizoaffective disorder, bipolar type: Secondary | ICD-10-CM | POA: Diagnosis present

## 2012-12-18 DIAGNOSIS — F209 Schizophrenia, unspecified: Secondary | ICD-10-CM | POA: Insufficient documentation

## 2012-12-18 DIAGNOSIS — Z8719 Personal history of other diseases of the digestive system: Secondary | ICD-10-CM | POA: Insufficient documentation

## 2012-12-18 DIAGNOSIS — F319 Bipolar disorder, unspecified: Secondary | ICD-10-CM | POA: Insufficient documentation

## 2012-12-18 DIAGNOSIS — F17200 Nicotine dependence, unspecified, uncomplicated: Secondary | ICD-10-CM | POA: Insufficient documentation

## 2012-12-18 DIAGNOSIS — Z79899 Other long term (current) drug therapy: Secondary | ICD-10-CM | POA: Insufficient documentation

## 2012-12-18 DIAGNOSIS — R4182 Altered mental status, unspecified: Secondary | ICD-10-CM | POA: Insufficient documentation

## 2012-12-18 LAB — COMPREHENSIVE METABOLIC PANEL
AST: 14 U/L (ref 0–37)
Albumin: 3.5 g/dL (ref 3.5–5.2)
Calcium: 8.9 mg/dL (ref 8.4–10.5)
Creatinine, Ser: 0.86 mg/dL (ref 0.50–1.10)
Total Protein: 7.3 g/dL (ref 6.0–8.3)

## 2012-12-18 LAB — ETHANOL: Alcohol, Ethyl (B): <11 mg/dL (ref 0–11)

## 2012-12-18 LAB — RAPID URINE DRUG SCREEN, HOSP PERFORMED
Amphetamines: NOT DETECTED
Barbiturates: NOT DETECTED
Benzodiazepines: NOT DETECTED
Cocaine: NOT DETECTED
Opiates: NOT DETECTED
Tetrahydrocannabinol: POSITIVE — AB

## 2012-12-18 LAB — CBC
MCH: 27.7 pg (ref 26.0–34.0)
MCHC: 32.7 g/dL (ref 30.0–36.0)
MCV: 84.6 fL (ref 78.0–100.0)
Platelets: 246 10*3/uL (ref 150–400)
RDW: 16.3 % — ABNORMAL HIGH (ref 11.5–15.5)
WBC: 8.3 10*3/uL (ref 4.0–10.5)

## 2012-12-18 LAB — ACETAMINOPHEN LEVEL: Acetaminophen (Tylenol), Serum: <15 ug/mL (ref 10–30)

## 2012-12-18 LAB — SALICYLATE LEVEL: Salicylate Lvl: <2 mg/dL — ABNORMAL LOW (ref 2.8–20.0)

## 2012-12-18 MED ORDER — TRAMADOL HCL 50 MG PO TABS
50.0000 mg | ORAL_TABLET | Freq: Four times a day (QID) | ORAL | Status: DC | PRN
  Administered 2012-12-20: 50 mg via ORAL
  Filled 2012-12-18: qty 1

## 2012-12-18 MED ORDER — ARIPIPRAZOLE 10 MG PO TABS
10.0000 mg | ORAL_TABLET | Freq: Every day | ORAL | Status: DC
  Administered 2012-12-19 – 2012-12-20 (×2): 10 mg via ORAL
  Filled 2012-12-18 (×2): qty 1

## 2012-12-18 MED ORDER — DIPHENHYDRAMINE HCL 25 MG PO CAPS
25.0000 mg | ORAL_CAPSULE | Freq: Four times a day (QID) | ORAL | Status: DC | PRN
  Filled 2012-12-18: qty 1

## 2012-12-18 MED ORDER — LORAZEPAM 1 MG PO TABS
2.0000 mg | ORAL_TABLET | Freq: Four times a day (QID) | ORAL | Status: DC | PRN
  Administered 2012-12-18: 2 mg via ORAL
  Administered 2012-12-19: 1 mg via ORAL
  Filled 2012-12-18: qty 2
  Filled 2012-12-18: qty 1

## 2012-12-18 MED ORDER — IBUPROFEN 200 MG PO TABS: 200.0000 mg | ORAL_TABLET | Freq: Four times a day (QID) | ORAL | Status: DC | PRN

## 2012-12-18 MED ORDER — ONDANSETRON 4 MG PO TBDP
4.0000 mg | ORAL_TABLET | Freq: Three times a day (TID) | ORAL | Status: DC | PRN
  Administered 2012-12-18: 4 mg via ORAL
  Filled 2012-12-18: qty 1

## 2012-12-18 NOTE — ED Notes (Signed)
Patient complains of increase anxiety and agitation. Respirations equal and unlabored. Skin warm and dry. No acute distress noted. Patricia Sievert, PA notified and reported that he would place new orders.

## 2012-12-18 NOTE — ED Notes (Signed)
Patient has two bags of belongings in locker 30. 

## 2012-12-18 NOTE — ED Notes (Signed)
Pt brought over by security and NT from [facility], pt was left in waiting area alone and pt left. I called [facility] and they said pt was SI with a plan to jump off a bridge. Security and [employer] notified and looking for pt.

## 2012-12-18 NOTE — BH Assessment (Signed)
Assessment Note   Patient is a 42 year old black female that reports SI with a plan to kill herself by jumping off of a bridge. Patient reports that she is not able to contract for safety. Patient reports that her cousin shot himself in the head 2 days ago. Patient reports that her her grandmother died in 09/24/10 and her child died at the age of 36 months in 09-23-2008. Patient reports a prior attempt to kill herself with an overdose of medication shortly after the death of her child.  Patient reports that she has 3 other children that do not live with her.  Patient reports that her children live with their fathers.  Patient reports that she is estranged from her husband and the only family that she has in this state just killed himself 2 days ago.   Patient denies HI.  Patient denies psychosis.   Patient reports a prior history of psychiatric hospitalization and outpatient therapy and medication management from [facility].  Patient reports that she is compliant with taking her medication.  Patient denies psychosis. Writer consulted with Dr. Dub Mikes regarding the patient meeting criteria for inpatient hospitalization. Patient has been accepted by Dr. Vanita Ingles pending medical clearance and bed availability.   Writer contacted the charge nurse at [facility]) and informed her that the patient would be transported to [facility] from [facility]. Writer contacted security and a tech in order to have the patient transported to the ER.   Writer informed the patient that she would be going to [facility] for medical clearance.   Axis I:  Schizoaffective Disorder and Major Depressive Disorder  Axis II: Deferred Axis III:   Past Medical History   Diagnosis  Date   .  Depression     .  Bipolar 1 disorder     .  Hypertension     .  Hemorrhoid     .  Transfusion history     .  Schizophrenia      Axis IV: economic  problems, educational problems, occupational problems, other psychosocial or environmental problems, problems related to social environment, problems with access to health care services and problems with primary support group Axis V: 21-30 behavior considerably influenced by delusions or hallucinations OR serious impairment in judgment, communication OR inability to function in almost all areas   Past Medical History:   Past Medical History   Diagnosis  Date   .  Depression     .  Bipolar 1 disorder     .  Hypertension     .  Hemorrhoid     .  Transfusion history     .  Schizophrenia         Past Surgical History   Procedure  Laterality  Date   .  Cesarean section          Family History:   Family History  Problem  Relation  Age of Onset   .  Diabetes type II  Other     .  Diabetes  Mother        Social History: reports that she has been smoking Cigarettes.  She has a 6.5 pack-year smoking history. She does not have any smokeless tobacco history on file. She reports that she uses illicit drugs (Marijuana). She reports that she does not drink alcohol.   Additional Social History:     CIWA:  COWS:    Allergies:   Allergies   Allergen  Reactions   .  Flagyl [Metronidazole Hcl]  Nausea Only   .  Sulfa Antibiotics  Nausea Only       nausea      Home Medications:  (Not in a hospital admission)   OB/GYN Status:  No LMP recorded.   General Assessment Data Location of Assessment: [facility] Is this a Tele or Face-to-Face Assessment?: Face-to-Face Is this an Initial Assessment or a Re-assessment for this encounter?: Initial Assessment Living Arrangements: Alone Can pt return to current living arrangement?: Yes Admission Status: Voluntary Is patient capable of signing voluntary admission?: Yes Transfer from: Other (Comment) Referral Source: Self/Family/Friend   Medical Screening Exam [facility] Walk-in ONLY) Medical Exam completed: Yes   [facility] Crisis  Care Plan Living Arrangements: Alone   Education Status Is patient currently in school?: No   Risk to self Suicidal Ideation: Yes-Currently Present Suicidal Intent: Yes-Currently Present Is patient at risk for suicide?: Yes Suicidal Plan?: Yes-Currently Present Specify Current Suicidal Plan: jumping off a bridge   Access to Means: Yes Specify Access to Suicidal Means: bridge   What has been your use of drugs/alcohol within the last 12 months?: none reported Previous Attempts/Gestures: Yes How many times?: 2 Other Self Harm Risks: none Triggers for Past Attempts: Unpredictable Intentional Self Injurious Behavior: None Family Suicide History: Yes Recent stressful life event(s): Divorce;Loss (Comment);Job Loss;Financial Problems Persecutory voices/beliefs?: No Depression: Yes Depression Symptoms: Despondent;Tearfulness;Fatigue;Guilt;Loss of interest in usual pleasures;Feeling worthless/self pity Substance abuse history and/or treatment for substance abuse?: No Suicide prevention information given to non-admitted patients: Yes   Risk to Others Homicidal Ideation: No Thoughts of Harm to Others: No Current Homicidal Intent: No Current Homicidal Plan: No Access to Homicidal Means: No Identified Victim: None Reported History of harm to others?: No Assessment of Violence: None Noted Violent Behavior Description: calm and quiet Does patient have access to weapons?: No Criminal Charges Pending?: No Does patient have a court date: No   Psychosis Hallucinations: None noted Delusions: None noted   Mental Status Report Appear/Hygiene: Disheveled Eye Contact: Fair Motor Activity: Freedom of movement Speech: Logical/coherent Level of Consciousness: Alert Mood: Depressed;Despair;Helpless;Sullen;Worthless, low self-esteem Affect: Blunted;Frightened Anxiety Level: None Thought Processes: Coherent;Relevant Judgement: Unimpaired Orientation:  Person;Place;Time;Situation Obsessive Compulsive Thoughts/Behaviors: None   Cognitive Functioning Concentration: Decreased Memory: Recent Intact;Remote Intact IQ: Average Insight: Fair Impulse Control: Poor Appetite: Poor Weight Loss: 0 Weight Gain: 20 Sleep: Decreased Total Hours of Sleep: 3 Vegetative Symptoms: Decreased grooming   ADLScreening [facility] Assessment [facility]) Patient's cognitive ability adequate to safely complete daily activities?: No Patient able to express need for assistance with ADLs?: Yes Independently performs ADLs?: Yes (appropriate for developmental age)   Prior Inpatient Therapy Prior Inpatient Therapy: Yes Prior Therapy Dates: 2014, 2010 Prior Therapy Facilty/Provider(s): [facility], [facility]   Reason for Treatment: SI, depression   Prior Outpatient Therapy Prior Outpatient Therapy: Yes Prior Therapy Dates: ongoing   Prior Therapy Facilty/Provider(s): [facility] Reason for Treatment:  depression and medication management    ADL Screening (condition at time of admission) Patient's cognitive ability adequate to safely complete daily activities?: No Patient able to express need for assistance with ADLs?: Yes Independently performs ADLs?: Yes (appropriate for developmental age)       Additional Information 1:1 In Past 12 Months?: No CIRT Risk: No Elopement Risk: No Does patient have medical clearance?: Yes     Disposition:   Disposition Initial Assessment Completed for this Encounter: Yes Disposition of Patient: Inpatient treatment program Type of inpatient treatment program: Adult   On Site Evaluation by:    Reviewed with Physician:     Phillip Heal LaVerne 12/18/2012 1:43 PM

## 2012-12-18 NOTE — ED Provider Notes (Signed)
CSN: [id number]     Arrival date & time 12/18/12  1338 History   First MD Initiated Contact with Patient 12/18/12 1511     Chief Complaint  Patient presents with  . Medical Clearance   (Consider location/radiation/quality/duration/timing/severity/associated sxs/prior Treatment) Patient is a 42 y.o. female presenting with altered mental status. The history is provided by the patient (the pt states she is suicidal). No language interpreter was used.  Altered Mental Status Presenting symptoms: behavior changes   Severity:  Severe Most recent episode:  More than 2 days ago Episode history:  Continuous Progression:  Worsening Chronicity:  Recurrent Associated symptoms: no abdominal pain, no hallucinations, no headaches, no rash and no seizures     Past Medical History  Diagnosis Date  . Depression   . Bipolar 1 disorder   . Hypertension   . Hemorrhoid   . Transfusion history   . Schizophrenia    Past Surgical History  Procedure Laterality Date  . Cesarean section     Family History  Problem Relation Age of Onset  . Diabetes type II Other   . Diabetes Mother    History  Substance Use Topics  . Smoking status: Current Every Day Smoker -- 0.50 packs/day for 13 years    Types: Cigarettes  . Smokeless tobacco: Not on file  . Alcohol Use: No   OB History   Grav Para Term Preterm Abortions TAB SAB Ect Mult Living                 Review of Systems  Constitutional: Negative for appetite change and fatigue.  HENT: Negative for congestion, sinus pressure and ear discharge.   Eyes: Negative for discharge.  Respiratory: Negative for cough.   Cardiovascular: Negative for chest pain.  Gastrointestinal: Negative for abdominal pain and diarrhea.  Genitourinary: Negative for frequency and hematuria.  Musculoskeletal: Negative for back pain.  Skin: Negative for rash.  Neurological: Negative for seizures and headaches.  Psychiatric/Behavioral: Negative for hallucinations.   Depressed and suicidal    Allergies  Flagyl and Sulfa antibiotics  Home Medications   Current Outpatient Rx  Name  Route  Sig  Dispense  Refill  . ARIPiprazole (ABILIFY) 10 MG tablet   Oral   Take 10 mg by mouth daily.         . diphenhydrAMINE (BENADRYL) 25 mg capsule   Oral   Take 25 mg by mouth every 6 (six) hours as needed for itching.         Marland Kitchen ibuprofen (ADVIL,MOTRIN) 200 MG tablet   Oral   Take 200 mg by mouth every 6 (six) hours as needed for pain.         . traMADol (ULTRAM) 50 MG tablet   Oral   Take 50 mg by mouth every 6 (six) hours as needed for pain.          BP 139/105  Pulse 65  Temp(Src) 98.1 F (36.7 C) (Oral)  Resp 20  SpO2 100% Physical Exam  Constitutional: She is oriented to person, place, and time. She appears well-developed.  HENT:  Head: Normocephalic.  Eyes: Conjunctivae and EOM are normal. No scleral icterus.  Neck: Neck supple. No thyromegaly present.  Cardiovascular: Normal rate and regular rhythm.  Exam reveals no gallop and no friction rub.   No murmur heard. Pulmonary/Chest: No stridor. She has no wheezes. She has no rales. She exhibits no tenderness.  Abdominal: She exhibits no distension. There is no tenderness. There is no rebound.  Musculoskeletal: Normal range of motion. She exhibits no edema.  Lymphadenopathy:    She has no cervical adenopathy.  Neurological: She is oriented to person, place, and time. Coordination normal.  Skin: No rash noted. No erythema.  Psychiatric:  Depressed and suicidal    ED Course  Procedures (including critical care time) Labs Review Labs Reviewed  CBC - Abnormal; Notable for the following:    RBC 3.18 (*)    Hemoglobin 8.8 (*)    HCT 26.9 (*)    RDW 16.3 (*)    All other components within normal limits  COMPREHENSIVE METABOLIC PANEL - Abnormal; Notable for the following:    Glucose, Bld 102 (*)    Total Bilirubin 0.2 (*)    GFR calc non Af Amer 82 (*)    All other components  within normal limits  SALICYLATE LEVEL - Abnormal; Notable for the following:    Salicylate Lvl <2.0 (*)    All other components within normal limits  ACETAMINOPHEN LEVEL  ETHANOL  URINE RAPID DRUG SCREEN (HOSP PERFORMED)  POCT PREGNANCY, URINE   Imaging Review No results found.  MDM  No diagnosis found.     Benny Lennert, MD 12/18/12 [phone]

## 2012-12-18 NOTE — [facility] Counselor (Signed)
Writer assessed the patient.  During the assessment the patient reports that she has a plan to kill herself by jumping off of a bridge.  Patient reports that she is not able to contract for safety.  Patient reports that her cousin shot himself in the head 2 days ago.  Her grandmother died in 2010/09/09 and her child died at the age of 46 months in Sep 08, 2008.  Patient reports a prior attempt to kill herself with an overdose of medication shortly after the death of her son.    Writer consulted with Dr. Dub Mikes regarding the patient meeting criteria for inpatient hospitalization.  Patient has been accepted by Dr. Vanita Ingles pending medical clearance and bed availability.  Writer contacted the charge nurse at [facility]) and informed her that the patient would be transported to [facility] from [facility].  Writer contacted security and a tech in order to have the patient transported to the ER.   Writer informed the patient that she would be going to [facility] for medical clearance.

## 2012-12-18 NOTE — ED Notes (Signed)
Patient complains of nausea at this time. Respirations equal and unlabored. Skin warm and dry. No acute distress noted. Dr. Estell Harpin  Informed and new orders received and read back.

## 2012-12-18 NOTE — ED Notes (Signed)
Pt to triage room #4 from waiting area by PD. Pt arrives shaking at this time and admits to "want to jump off a bridge"

## 2012-12-19 DIAGNOSIS — F411 Generalized anxiety disorder: Secondary | ICD-10-CM

## 2012-12-19 NOTE — BH Assessment (Signed)
Contacted [facility] intake to verify receipt of referral documentation. I spoke with [name] who reports that she is unable to verify that they have received referral documentation at this time. [name] reports that she will check as well as follow-up with her colleague [name] and notify TTS.  Patricia Peach, MS, LCASA Assessment Counselor

## 2012-12-19 NOTE — Consult Note (Signed)
Reason for Consult:suicidal with a plan to kill herself Referring Physician: ER MD  [name] is an 42 y.o. female.  HPI: [name] has been depressed for years with a suicidal attempt 4 yrs ago.  Her brother killed himself several days ago and she is currently suicidal with a plan to jump off a bridge.  Past Medical History  Diagnosis Date  . Depression   . Bipolar 1 disorder   . Hypertension   . Hemorrhoid   . Transfusion history   . Schizophrenia     Past Surgical History  Procedure Laterality Date  . Cesarean section      Family History  Problem Relation Age of Onset  . Diabetes type II Other   . Diabetes Mother     Social History:  reports that she has been smoking Cigarettes.  She has a 6.5 pack-year smoking history. She does not have any smokeless tobacco history on file. She reports that she uses illicit drugs (Marijuana). She reports that she does not drink alcohol.  Allergies:  Allergies  Allergen Reactions  . Flagyl [Metronidazole Hcl] Nausea Only  . Sulfa Antibiotics Nausea Only    nausea    Medications: I have reviewed the patient's current medications.  Results for orders placed during the hospital encounter of 12/18/12 (from the past 48 hour(s))  ACETAMINOPHEN LEVEL     Status: None   Collection Time    12/18/12  2:50 PM      Result Value Range   Acetaminophen (Tylenol), Serum <15.0  10 - 30 ug/mL   Comment:            THERAPEUTIC CONCENTRATIONS VARY     SIGNIFICANTLY. A RANGE OF 10-30     ug/mL MAY BE AN EFFECTIVE     CONCENTRATION FOR MANY PATIENTS.     HOWEVER, SOME ARE BEST TREATED     AT CONCENTRATIONS OUTSIDE THIS     RANGE.     ACETAMINOPHEN CONCENTRATIONS     >150 ug/mL AT 4 HOURS AFTER     INGESTION AND >50 ug/mL AT 12     HOURS AFTER INGESTION ARE     OFTEN ASSOCIATED WITH TOXIC     REACTIONS.  CBC     Status: Abnormal   Collection Time    12/18/12  2:50 PM      Result Value Range   WBC 8.3  4.0 - 10.5 K/uL   RBC 3.18 (*) 3.87  - 5.11 MIL/uL   Hemoglobin 8.8 (*) 12.0 - 15.0 g/dL   HCT 16.1 (*) 09.6 - 04.5 %   MCV 84.6  78.0 - 100.0 fL   MCH 27.7  26.0 - 34.0 pg   MCHC 32.7  30.0 - 36.0 g/dL   RDW 40.9 (*) 81.1 - 91.4 %   Platelets 246  150 - 400 K/uL  COMPREHENSIVE METABOLIC PANEL     Status: Abnormal   Collection Time    12/18/12  2:50 PM      Result Value Range   Sodium 138  135 - 145 mEq/L   Potassium 3.5  3.5 - 5.1 mEq/L   Chloride 103  96 - 112 mEq/L   CO2 29  19 - 32 mEq/L   Glucose, Bld 102 (*) 70 - 99 mg/dL   BUN 9  6 - 23 mg/dL   Creatinine, Ser 7.82  0.50 - 1.10 mg/dL   Calcium 8.9  8.4 - 95.6 mg/dL   Total Protein 7.3  6.0 -  8.3 g/dL   Albumin 3.5  3.5 - 5.2 g/dL   AST 14  0 - 37 U/L   ALT 8  0 - 35 U/L   Alkaline Phosphatase 74  39 - 117 U/L   Total Bilirubin 0.2 (*) 0.3 - 1.2 mg/dL   GFR calc non Af Amer 82 (*) >90 mL/min   GFR calc Af Amer >90  >90 mL/min   Comment: (NOTE)     The eGFR has been calculated using the CKD EPI equation.     This calculation has not been validated in all clinical situations.     eGFR's persistently <90 mL/min signify possible Chronic Kidney     Disease.  ETHANOL     Status: None   Collection Time    12/18/12  2:50 PM      Result Value Range   Alcohol, Ethyl (B) <11  0 - 11 mg/dL   Comment:            LOWEST DETECTABLE LIMIT FOR     SERUM ALCOHOL IS 11 mg/dL     FOR MEDICAL PURPOSES ONLY  SALICYLATE LEVEL     Status: Abnormal   Collection Time    12/18/12  2:50 PM      Result Value Range   Salicylate Lvl <2.0 (*) 2.8 - 20.0 mg/dL  URINE RAPID DRUG SCREEN (HOSP PERFORMED)     Status: Abnormal   Collection Time    12/18/12  3:01 PM      Result Value Range   Opiates NONE DETECTED  NONE DETECTED   Cocaine NONE DETECTED  NONE DETECTED   Benzodiazepines NONE DETECTED  NONE DETECTED   Amphetamines NONE DETECTED  NONE DETECTED   Tetrahydrocannabinol POSITIVE (*) NONE DETECTED   Barbiturates NONE DETECTED  NONE DETECTED   Comment:            DRUG  SCREEN FOR MEDICAL PURPOSES     ONLY.  IF CONFIRMATION IS NEEDED     FOR ANY PURPOSE, NOTIFY LAB     WITHIN 5 DAYS.                LOWEST DETECTABLE LIMITS     FOR URINE DRUG SCREEN     Drug Class       Cutoff (ng/mL)     Amphetamine      1000     Barbiturate      200     Benzodiazepine   200     Tricyclics       300     Opiates          300     Cocaine          300     THC              50  POCT PREGNANCY, URINE     Status: None   Collection Time    12/18/12  3:17 PM      Result Value Range   Preg Test, Ur NEGATIVE  NEGATIVE   Comment:            THE SENSITIVITY OF THIS     METHODOLOGY IS >24 mIU/mL    No results found.  Review of Systems  Constitutional: Negative.   HENT: Negative.   Eyes: Negative.   Respiratory: Negative.   Cardiovascular: Negative.   Gastrointestinal: Negative.   Genitourinary: Negative.   Musculoskeletal: Negative.   Skin: Negative.   Neurological: Negative.  Endo/Heme/Allergies: Negative.  Suic Psychiatric/Behavioral: Positive for depression and suicidal ideas. Negative for hallucinations, memory loss and substance abuse. The patient is nervous/anxious and has insomnia.    Blood pressure 99/62, pulse 61, temperature 98.7 F (37.1 C), temperature source Oral, resp. rate 15, SpO2 99.00%. Physical Exam  Assessment/Plan: [name] remains suicidal and says she actually feels worse today.  Still needs admission to [facility] inpatient or whatever inpatient facility that can take her.  Abou Sterkel D 12/19/2012, 9:35 AM

## 2012-12-19 NOTE — BH Assessment (Signed)
Contacted Marcelino Duster @ [facility] to check bed availability. Marcelino Duster reports that they are at capacity for adults at this time but anticipate discharges tomorrow morning and recommend that TTS follow-up with them tomorrow morning for bed placement.    Glorious Peach, MS, LCASA Assessment Counselor

## 2012-12-19 NOTE — Progress Notes (Signed)
CSW followed up with [facility] concerning review of pt.  Transferred to intake.  No answer and no voicemail available to leave message.   Marva Panda, Theresia Majors  [phone] .12/19/2012 9:06 pm

## 2012-12-19 NOTE — BH Assessment (Signed)
This Clinical research associate contacted several hospitals in attempts to seek bed placement for patient. Please see below.  Spoke with Steward Drone @1830  and she reports that [facility] is at capacity at this time. Spoke with Okey Regal @1900  and she reports that [facility]  ER is full and they have several pt's pending beds at this time and recommended that we check back tomorrow morning for bed availability. Spoke with Pam @1930  and she reports, [facility]", at capacity at this time. Spoke with Toniann Fail @1935  and she reports that [facility] has beds and is accepting female adult referrals at this time.  Patient's assessment,labs, and facesheet were faxed to [facility] for review. Confirmed with Toniann Fail @1943  that she received the pt's referral information that was faxed. Toniann Fail reports that the pt's referral information will be reviewed. Follow-up required by TTS.   Glorious Peach, MS, LCASA Assessment Counselor
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
## 2012-12-20 ENCOUNTER — Encounter (HOSPITAL_COMMUNITY): Payer: Self-pay | Admitting: Psychiatry

## 2012-12-20 DIAGNOSIS — F329 Major depressive disorder, single episode, unspecified: Secondary | ICD-10-CM

## 2012-12-20 DIAGNOSIS — R45851 Suicidal ideations: Secondary | ICD-10-CM

## 2012-12-20 MED ORDER — ARIPIPRAZOLE 15 MG PO TABS
15.0000 mg | ORAL_TABLET | Freq: Every day | ORAL | Status: DC
Start: 2012-12-21 — End: 2012-12-21
  Administered 2012-12-21: 15 mg via ORAL
  Filled 2012-12-20: qty 1

## 2012-12-20 MED ORDER — HYDROXYZINE HCL 25 MG PO TABS
25.0000 mg | ORAL_TABLET | Freq: Three times a day (TID) | ORAL | Status: DC
  Administered 2012-12-20 – 2012-12-21 (×3): 25 mg via ORAL
  Filled 2012-12-20 (×4): qty 1

## 2012-12-20 NOTE — Consult Note (Signed)
  Patient stated her cousin killed himself four days ago, he had been a Nurse, adult.  Her depression increased to the point of suicidal ideations with a plan to jump off a bridge.  She was adamant about getting Ativan for her "nervousness"--Vistaril ordered instead after the MD explained the addictive properties of Ativan.  She is a patient of [facility] and was there about two months ago.  After much discussion about medications, patient agreed to increasing her current Abilify from 10 mg to 15 mg.  She does admit to hearing voices at times, angry voices that tell her to kill herself. Patient appears to be seeing things due to her frequent eye movements to a particular place in the room.  Due to her frequency at [facility], she is declined there and placement into an inpatient unit is being explored at this time.  I have personally seen the patient and agreed with the findings and involved in the treatment plan. Kathryne Sharper, MD

## 2012-12-20 NOTE — ED Notes (Signed)
Patient refuses vitals rn rashell notified

## 2012-12-20 NOTE — BH Assessment (Signed)
Confirmed with Victorino Dike at [facility] at 1730 that pt has been placed on their wait list. Victorino Dike reports that [facility] staff will contact TTS when a bed becomes available   Glorious Peach, MS, LCASA Assessment Counselor

## 2012-12-20 NOTE — Progress Notes (Signed)
CSW faxed referral to [facility].   Per Baxter Hire review is pending.  Marva Panda, Theresia Majors  [phone] .12/20/2012  9:43 pm

## 2012-12-20 NOTE — BH Assessment (Signed)
Writer faxed referral again to [facility].  Evette Cristal, [place] Assessment Counselor,

## 2012-12-21 NOTE — Consult Note (Signed)
  [name] is feeling much better.  Says just getting away helped her a lot.  The main loss was the unexpected and sudden suicide of her cousin whom she was visiting.  He seemed fine that day she said.  He left no explanation for what he did and it threw her for a loop.  She is dealing with it better now. She wants to go home to be with her family and has no wish to hurt herself, she says. Discharge home today.

## 2013-01-18 ENCOUNTER — Emergency Department (HOSPITAL_COMMUNITY)
Admission: EM | Admit: 2013-01-18 | Discharge: 2013-01-29 | Disposition: A | Discharge status: Other Acute Inpatient Hospital | Payer: Medicaid Other | Attending: Emergency Medicine | Admitting: Emergency Medicine

## 2013-01-18 ENCOUNTER — Encounter (HOSPITAL_COMMUNITY): Payer: Self-pay | Admitting: *Deleted

## 2013-01-18 ENCOUNTER — Ambulatory Visit (HOSPITAL_COMMUNITY)
Admission: RE | Admit: 2013-01-18 | Discharge: 2013-01-18 | Disposition: A | Discharge status: Home / Self Care | Payer: Medicaid Other | Attending: Psychiatry | Admitting: Psychiatry

## 2013-01-18 ENCOUNTER — Encounter (HOSPITAL_COMMUNITY): Payer: Self-pay | Admitting: Emergency Medicine

## 2013-01-18 DIAGNOSIS — F259 Schizoaffective disorder, unspecified: Secondary | ICD-10-CM | POA: Insufficient documentation

## 2013-01-18 DIAGNOSIS — Z8679 Personal history of other diseases of the circulatory system: Secondary | ICD-10-CM | POA: Insufficient documentation

## 2013-01-18 DIAGNOSIS — Z79899 Other long term (current) drug therapy: Secondary | ICD-10-CM | POA: Insufficient documentation

## 2013-01-18 DIAGNOSIS — F250 Schizoaffective disorder, bipolar type: Secondary | ICD-10-CM | POA: Diagnosis present

## 2013-01-18 DIAGNOSIS — F17200 Nicotine dependence, unspecified, uncomplicated: Secondary | ICD-10-CM | POA: Insufficient documentation

## 2013-01-18 DIAGNOSIS — R259 Unspecified abnormal involuntary movements: Secondary | ICD-10-CM

## 2013-01-18 DIAGNOSIS — I10 Essential (primary) hypertension: Secondary | ICD-10-CM | POA: Insufficient documentation

## 2013-01-18 DIAGNOSIS — F3289 Other specified depressive episodes: Secondary | ICD-10-CM | POA: Insufficient documentation

## 2013-01-18 DIAGNOSIS — F419 Anxiety disorder, unspecified: Secondary | ICD-10-CM | POA: Diagnosis present

## 2013-01-18 DIAGNOSIS — F329 Major depressive disorder, single episode, unspecified: Secondary | ICD-10-CM | POA: Insufficient documentation

## 2013-01-18 LAB — CBC
MCH: 27.2 pg (ref 26.0–34.0)
MCHC: 32.6 g/dL (ref 30.0–36.0)
MCV: 83.4 fL (ref 78.0–100.0)
Platelets: 352 10*3/uL (ref 150–400)
RBC: 3.49 MIL/uL — ABNORMAL LOW (ref 3.87–5.11)

## 2013-01-18 LAB — COMPREHENSIVE METABOLIC PANEL
ALT: 12 U/L (ref 0–35)
Albumin: 4 g/dL (ref 3.5–5.2)
Alkaline Phosphatase: 78 U/L (ref 39–117)
CO2: 25 mEq/L (ref 19–32)
Calcium: 9.2 mg/dL (ref 8.4–10.5)
Creatinine, Ser: 0.81 mg/dL (ref 0.50–1.10)
GFR calc Af Amer: >90 mL/min (ref >90)
GFR calc non Af Amer: 88 mL/min — ABNORMAL LOW (ref >90)
Glucose, Bld: 100 mg/dL — ABNORMAL HIGH (ref 70–99)
Sodium: 135 mEq/L (ref 135–145)
Total Protein: 8.2 g/dL (ref 6.0–8.3)

## 2013-01-18 LAB — SALICYLATE LEVEL: Salicylate Lvl: <2 mg/dL — ABNORMAL LOW (ref 2.8–20.0)

## 2013-01-18 MED ORDER — ZOLPIDEM TARTRATE 5 MG PO TABS
5.0000 mg | ORAL_TABLET | Freq: Every evening | ORAL | Status: DC | PRN
  Administered 2013-01-19 – 2013-01-22 (×2): 5 mg via ORAL
  Filled 2013-01-18 (×2): qty 1

## 2013-01-18 MED ORDER — LORAZEPAM 1 MG PO TABS
1.0000 mg | ORAL_TABLET | Freq: Three times a day (TID) | ORAL | Status: DC | PRN
  Administered 2013-01-19 – 2013-01-29 (×16): 1 mg via ORAL
  Filled 2013-01-18 (×18): qty 1

## 2013-01-18 MED ORDER — ALUM & MAG HYDROXIDE-SIMETH 200-200-20 MG/5ML PO SUSP
30.0000 mL | ORAL | Status: DC | PRN
Start: 2013-01-18 — End: 2013-01-29

## 2013-01-18 MED ORDER — ONDANSETRON HCL 4 MG PO TABS
4.0000 mg | ORAL_TABLET | Freq: Three times a day (TID) | ORAL | Status: DC | PRN
  Filled 2013-01-18: qty 1

## 2013-01-18 MED ORDER — IBUPROFEN 200 MG PO TABS
600.0000 mg | ORAL_TABLET | Freq: Three times a day (TID) | ORAL | Status: DC | PRN
  Administered 2013-01-19 – 2013-01-28 (×9): 600 mg via ORAL
  Filled 2013-01-18 (×10): qty 3

## 2013-01-18 MED ORDER — NICOTINE 21 MG/24HR TD PT24
21.0000 mg | MEDICATED_PATCH | Freq: Every day | TRANSDERMAL | Status: DC
  Administered 2013-01-19 – 2013-01-28 (×7): 21 mg via TRANSDERMAL
  Filled 2013-01-18 (×10): qty 1
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
## 2013-01-18 NOTE — ED Notes (Signed)
PT CHANGED INTO [employer]. PT AND BELONGINGS SEARCHED AND WANDED BY SECURITY.   PT HAS 2 BELONGINGS BAGS

## 2013-01-18 NOTE — ED Provider Notes (Signed)
CSN: [id number]     Arrival date & time 01/18/13  2117 History   First MD Initiated Contact with Patient 01/18/13 2144     Chief Complaint  Patient presents with  . Medical Clearance   (Consider location/radiation/quality/duration/timing/severity/associated sxs/prior Treatment) HPI Comments: 42 year old female with a past medical history of depression, bipolar, schizophrenia and hypertension presents to the emergency department with a complaint of worsening depression. Patient states she went over to behavioral health prior to coming to the ED because she was feeling suicidal, spoke with someone at behavioral health and was sent here for medical clearance. Patient states since beginning with the person at behavioral health she is no longer feeling suicidal. Denies any plan. She doesn't made to occasionally seeing things and hearing voices, such as seeing herself walking over a bridge and jumping off to kill herself. She is supposed to be on Seroquel for her depression, however states it makes her gain weight and took herself off of it a few days ago. Also states her anxiety has worsened since the last time she was discharged from the hospital she was not given Ativan. States she has had problems with depression for about 20 years ever since she lost one of her children. Denies alcohol or drug use.  The history is provided by the patient.    Past Medical History  Diagnosis Date  . Depression   . Bipolar 1 disorder   . Hypertension   . Hemorrhoid   . Transfusion history   . Schizophrenia    Past Surgical History  Procedure Laterality Date  . Cesarean section     Family History  Problem Relation Age of Onset  . Diabetes type II Other    History  Substance Use Topics  . Smoking status: Current Every Day Smoker -- 1.00 packs/day for 17 years    Types: Cigarettes  . Smokeless tobacco: Never Used  . Alcohol Use: No   OB History   Grav Para Term Preterm Abortions TAB SAB Ect Mult Living                  Review of Systems  Psychiatric/Behavioral: Positive for suicidal ideas, hallucinations and dysphoric mood.  All other systems reviewed and are negative.    Allergies  Flagyl and Sulfa antibiotics  Home Medications   Current Outpatient Rx  Name  Route  Sig  Dispense  Refill  . ARIPiprazole (ABILIFY) 10 MG tablet   Oral   Take 10 mg by mouth daily.         . diphenhydrAMINE (BENADRYL) 25 mg capsule   Oral   Take 25 mg by mouth every 6 (six) hours as needed (sleep).          Marland Kitchen ibuprofen (ADVIL,MOTRIN) 200 MG tablet   Oral   Take 200 mg by mouth every 6 (six) hours as needed for pain.         . traMADol (ULTRAM) 50 MG tablet   Oral   Take 50 mg by mouth every 6 (six) hours as needed for pain.          BP 115/78  Pulse 72  Temp(Src) 98.8 F (37.1 C) (Oral)  Resp 16  Ht 5\' 4"  (1.626 m)  Wt 212 lb (96.163 kg)  BMI 36.37 kg/m2  SpO2 100%  LMP 01/11/2013 Physical Exam  Nursing note and vitals reviewed. Constitutional: She is oriented to person, place, and time. She appears well-developed and well-nourished. No distress.  HENT:  Head: Normocephalic  and atraumatic.  Mouth/Throat: Oropharynx is clear and moist.  Eyes: Conjunctivae and EOM are normal. Pupils are equal, round, and reactive to light.  Neck: Normal range of motion. Neck supple.  Cardiovascular: Normal rate, regular rhythm and normal heart sounds.   Pulmonary/Chest: Effort normal and breath sounds normal.  Abdominal: Soft. Bowel sounds are normal. There is no tenderness.  Musculoskeletal: Normal range of motion. She exhibits no edema.  Neurological: She is alert and oriented to person, place, and time.  Skin: Skin is warm and dry. She is not diaphoretic.  Psychiatric: Her speech is normal and behavior is normal. Her mood appears anxious. She exhibits a depressed mood. She expresses no homicidal ideation. She expresses no suicidal plans.    ED Course  Procedures (including critical  care time) Labs Review Labs Reviewed  CBC  COMPREHENSIVE METABOLIC PANEL  URINE RAPID DRUG SCREEN (HOSP PERFORMED)  ACETAMINOPHEN LEVEL  SALICYLATE LEVEL  ETHANOL   Imaging Review No results found.  MDM  No diagnosis found.  Patient with depression, noncompliant with medications. Initially had thoughts of suicide, however currently denying any suicidal ideations. Admits to auditory and visual hallucinations. Psych labs pending. TTS consult initiated.  Patient medically cleared.  Trevor Mace, PA-C 01/19/13 [phone]

## 2013-01-18 NOTE — ED Notes (Signed)
Pt states she went to [facility] and was sent here for medical clearance  Pt states she is depressed and was having thoughts of SI prior to going to [facility] but after talking to someone there she no longer wants to kill herself  Pt states she has had visions of herself jumping off a bridge and stuff like that   Pt states she is here to get better because she is tired of coming to the hospital so much  Pt states she lost a child 20 years ago and has not been right since and has been dealing with this for that long  Pt states she quit taking her seroquel recently because it made her gain weight

## 2013-01-19 DIAGNOSIS — F259 Schizoaffective disorder, unspecified: Secondary | ICD-10-CM

## 2013-01-19 LAB — URINALYSIS, ROUTINE W REFLEX MICROSCOPIC
Glucose, UA: NEGATIVE mg/dL
Hgb urine dipstick: NEGATIVE
Protein, ur: NEGATIVE mg/dL
Specific Gravity, Urine: 1.021 (ref 1.005–1.030)
pH: 7 (ref 5.0–8.0)

## 2013-01-19 LAB — URINE MICROSCOPIC-ADD ON

## 2013-01-19 LAB — RAPID URINE DRUG SCREEN, HOSP PERFORMED
Amphetamines: NOT DETECTED
Benzodiazepines: NOT DETECTED
Cocaine: NOT DETECTED

## 2013-01-19 MED ORDER — LURASIDONE HCL 40 MG PO TABS
40.0000 mg | ORAL_TABLET | Freq: Every day | ORAL | Status: DC
  Filled 2013-01-19 (×3): qty 1

## 2013-01-19 MED ORDER — SERTRALINE HCL 50 MG PO TABS
50.0000 mg | ORAL_TABLET | Freq: Every day | ORAL | Status: DC
  Administered 2013-01-19 – 2013-01-29 (×11): 50 mg via ORAL
  Filled 2013-01-19 (×11): qty 1

## 2013-01-19 NOTE — Progress Notes (Signed)
Underwriter initiated finding placement for pt.  The following hospitals were faxed for placement for pt: 1)[facility] 3)[facility] 4)[facility] to put on waitlist

## 2013-01-19 NOTE — ED Notes (Signed)
Patient resting, eyes closed, chest observed for rise and fall 

## 2013-01-19 NOTE — ED Notes (Signed)
Notified patient we need a urine sample from her. Patient states she is unable to go at this time. Specimen cup labeled and placed at bedside.

## 2013-01-19 NOTE — ED Notes (Signed)
Patient medications sent to pharmacy for storage.

## 2013-01-19 NOTE — Progress Notes (Signed)
C. Ciarrah Rae, MHT followed up with [facility] and they had not received the paperwork on pt. Followed up with B. Womble at [facility] and will re-fax the paperwork already there and follow-up again.

## 2013-01-19 NOTE — ED Notes (Signed)
Pt belongings in locker 37 

## 2013-01-19 NOTE — Consult Note (Signed)
[facility] Face-to-Face Psychiatry Consult   Reason for Consult:  Suicidal ideatuion Referring Physician:  EDP [name] is an 42 y.o. female.  Assessment: AXIS I:  Schizoaffective Disorder AXIS II:  Deferred AXIS III:   Past Medical History  Diagnosis Date  . Depression   . Bipolar 1 disorder   . Hypertension   . Hemorrhoid   . Transfusion history   . Schizophrenia    AXIS IV:  housing problems, occupational problems, problems related to legal system/crime, problems related to social environment and problems with primary support group AXIS V:  21-30 behavior considerably influenced by delusions or hallucinations OR serious impairment in judgment, communication OR inability to function in almost all areas  Plan:  Recommend psychiatric Inpatient admission when medically cleared.  Subjective:   [name] is a 42 y.o. female patient admitted with AUDITORY HALLUCINATION.  HPI:  [name] seen today with [name].   This is one of the multiple visits and admissions for this patient at [facility] and [facility].   She has been hospitalized at several other facilities in the past for her Psychiatric illness.   Patient reports this visit was prompted by visual  Hallucination seeing her self jump off a bridge.  She also have been having suicidal thoughts wanting to jump over a bridge.  Her stressor includes problem with her family members not taking care of her grandmother, her children are now staying with their father and her being homeless.  Patient states she used to own a home and is homeless now.  She was not too keen in telling this Clinical research associate if she takes her medications.   We will be referring patient to  [facility] for long term care but meanwhile we will restart her home medications.   HPI Elements:   Location:  [facility]. Quality:  SEVERE. Severity:  SEVERE.  Past Psychiatric History: Past Medical History  Diagnosis Date  . Depression   . Bipolar 1 disorder   . Hypertension   . Hemorrhoid   .  Transfusion history   . Schizophrenia     reports that she has been smoking Cigarettes.  She has a 17 pack-year smoking history. She has never used smokeless tobacco. She reports that she does not drink alcohol or use illicit drugs. Family History  Problem Relation Age of Onset  . Diabetes type II Other            Allergies:   Allergies  Allergen Reactions  . Flagyl [Metronidazole Hcl] Nausea Only  . Sulfa Antibiotics Nausea Only    nausea    ACT Assessment Complete:  Yes:    Educational Status    Risk to Self: Risk to self Is patient at risk for suicide?: No Substance abuse history and/or treatment for substance abuse?: No  Risk to Others:    Abuse:    Prior Inpatient Therapy:    Prior Outpatient Therapy:    Additional Information:                    Objective: Blood pressure 111/64, pulse 94, temperature 98.1 F (36.7 C), temperature source Oral, resp. rate 16, height 5\' 4"  (1.626 m), weight 96.163 kg (212 lb), last menstrual period 01/11/2013, SpO2 97.00%.Body mass index is 36.37 kg/(m^2). Results for orders placed during the hospital encounter of 01/18/13 (from the past 72 hour(s))  CBC     Status: Abnormal   Collection Time    01/18/13 10:21 PM  Result Value Range   WBC 10.0  4.0 - 10.5 K/uL   RBC 3.49 (*) 3.87 - 5.11 MIL/uL   Hemoglobin 9.5 (*) 12.0 - 15.0 g/dL   HCT 45.4 (*) 09.8 - 11.9 %   MCV 83.4  78.0 - 100.0 fL   MCH 27.2  26.0 - 34.0 pg   MCHC 32.6  30.0 - 36.0 g/dL   RDW 14.7 (*) 82.9 - 56.2 %   Platelets 352  150 - 400 K/uL  COMPREHENSIVE METABOLIC PANEL     Status: Abnormal   Collection Time    01/18/13 10:21 PM      Result Value Range   Sodium 135  135 - 145 mEq/L   Potassium 3.8  3.5 - 5.1 mEq/L   Chloride 100  96 - 112 mEq/L   CO2 25  19 - 32 mEq/L   Glucose, Bld 100 (*) 70 - 99 mg/dL   BUN 12  6 - 23 mg/dL   Creatinine, Ser 1.30  0.50 - 1.10 mg/dL   Calcium 9.2  8.4 - 86.5 mg/dL   Total Protein 8.2  6.0 - 8.3 g/dL    Albumin 4.0  3.5 - 5.2 g/dL   AST 18  0 - 37 U/L   ALT 12  0 - 35 U/L   Alkaline Phosphatase 78  39 - 117 U/L   Total Bilirubin 0.4  0.3 - 1.2 mg/dL   GFR calc non Af Amer 88 (*) >90 mL/min   GFR calc Af Amer >90  >90 mL/min   Comment: (NOTE)     The eGFR has been calculated using the CKD EPI equation.     This calculation has not been validated in all clinical situations.     eGFR's persistently <90 mL/min signify possible Chronic Kidney     Disease.  ACETAMINOPHEN LEVEL     Status: None   Collection Time    01/18/13 10:21 PM      Result Value Range   Acetaminophen (Tylenol), Serum <15.0  10 - 30 ug/mL   Comment:            THERAPEUTIC CONCENTRATIONS VARY     SIGNIFICANTLY. A RANGE OF 10-30     ug/mL MAY BE AN EFFECTIVE     CONCENTRATION FOR MANY PATIENTS.     HOWEVER, SOME ARE BEST TREATED     AT CONCENTRATIONS OUTSIDE THIS     RANGE.     ACETAMINOPHEN CONCENTRATIONS     >150 ug/mL AT 4 HOURS AFTER     INGESTION AND >50 ug/mL AT 12     HOURS AFTER INGESTION ARE     OFTEN ASSOCIATED WITH TOXIC     REACTIONS.  SALICYLATE LEVEL     Status: Abnormal   Collection Time    01/18/13 10:21 PM      Result Value Range   Salicylate Lvl <2.0 (*) 2.8 - 20.0 mg/dL  ETHANOL     Status: None   Collection Time    01/18/13 10:21 PM      Result Value Range   Alcohol, Ethyl (B) <11  0 - 11 mg/dL   Comment:            LOWEST DETECTABLE LIMIT FOR     SERUM ALCOHOL IS 11 mg/dL     FOR MEDICAL PURPOSES ONLY     Performed at [facility]  URINE RAPID DRUG SCREEN (HOSP PERFORMED)     Status: None   Collection Time  01/19/13  8:56 AM      Result Value Range   Opiates NONE DETECTED  NONE DETECTED   Cocaine NONE DETECTED  NONE DETECTED   Benzodiazepines NONE DETECTED  NONE DETECTED   Amphetamines NONE DETECTED  NONE DETECTED   Tetrahydrocannabinol NONE DETECTED  NONE DETECTED   Barbiturates NONE DETECTED  NONE DETECTED   Comment:            DRUG SCREEN FOR MEDICAL PURPOSES      ONLY.  IF CONFIRMATION IS NEEDED     FOR ANY PURPOSE, NOTIFY LAB     WITHIN 5 DAYS.                LOWEST DETECTABLE LIMITS     FOR URINE DRUG SCREEN     Drug Class       Cutoff (ng/mL)     Amphetamine      1000     Barbiturate      200     Benzodiazepine   200     Tricyclics       300     Opiates          300     Cocaine          300     THC              50   Labs are reviewed and are pertinent for UNREMARKABLE LABS  Current Facility-Administered Medications  Medication Dose Route Frequency Provider Last Rate Last Dose  . alum & mag hydroxide-simeth (MAALOX/MYLANTA) 200-200-20 MG/5ML suspension 30 mL  30 mL Oral PRN Trevor Mace, PA-C      . ibuprofen (ADVIL,MOTRIN) tablet 600 mg  600 mg Oral Q8H PRN Trevor Mace, PA-C      . LORazepam (ATIVAN) tablet 1 mg  1 mg Oral Q8H PRN Trevor Mace, PA-C   1 mg at 01/19/13 1610  . nicotine (NICODERM CQ - dosed in mg/24 hours) patch 21 mg  21 mg Transdermal Daily Trevor Mace, PA-C   21 mg at 01/19/13 9604  . ondansetron (ZOFRAN) tablet 4 mg  4 mg Oral Q8H PRN Trevor Mace, PA-C      . zolpidem [facility]) tablet 5 mg  5 mg Oral QHS PRN Trevor Mace, PA-C       Current Outpatient Prescriptions  Medication Sig Dispense Refill  . ARIPiprazole (ABILIFY) 10 MG tablet Take 10 mg by mouth daily.      . diphenhydrAMINE (BENADRYL) 25 mg capsule Take 25 mg by mouth every 6 (six) hours as needed (sleep).       Marland Kitchen ibuprofen (ADVIL,MOTRIN) 200 MG tablet Take 200 mg by mouth every 6 (six) hours as needed for pain.      . traMADol (ULTRAM) 50 MG tablet Take 50 mg by mouth every 6 (six) hours as needed for pain.        Psychiatric Specialty Exam:     Blood pressure 111/64, pulse 94, temperature 98.1 F (36.7 C), temperature source Oral, resp. rate 16, height 5\' 4"  (1.626 m), weight 96.163 kg (212 lb), last menstrual period 01/11/2013, SpO2 97.00%.Body mass index is 36.37 kg/(m^2).  General Appearance: Casual  Eye Contact::  Fair  Speech:   Clear and Coherent and Normal Rate  Volume:  Normal  Mood:  Anxious, Depressed, Hopeless and Worthless  Affect:  Congruent, Depressed, Flat and Tearful  Thought Process:  Coherent, Goal Directed and Intact  Orientation:  Full (Time, Place, and Person)  Thought Content:  Hallucinations: Visual  Suicidal Thoughts:  Yes.  with intent/plan  Homicidal Thoughts:  No  Memory:  Immediate;   Good Recent;   Good Remote;   Good  Judgement:  Poor  Insight:  Shallow  Psychomotor Activity:  Normal  Concentration:  Fair  Recall:  NA  Akathisia:  NA  Handed:  Right  AIMS (if indicated):     Assets:  Desire for Improvement  Sleep:      Treatment Plan Summary:  Consult and face to face interview with [name] We will seek placement at [facility] for long term treatment We will restart patient's medications We will continue to monitor patient.  Daily contact with patient to assess and evaluate symptoms and progress in treatment Medication management  Earney Navy   PMHNP-BC 01/19/2013 3:27 PM

## 2013-01-19 NOTE — ED Notes (Signed)
Pt belongings as follows: gold colored bandana, blue long sleeve shirt, grey jeans, brown tote bag, box of tissue, bottle of water, 2 bags of personal hygiene items, cell phone charger, green socks, bh paperwork, plastic cup, 2 lighters, stained plastic sun catchter, black abg, black/grey nike tennis shoes, red washcloth, 1 pr of earings, multi-colored bracelet.  Belongings placed in locker 26.  Medications sent down to pharmacy.  Pt cell phone, money, and cedit/I.D. Cards given to security and locked into safe.

## 2013-01-19 NOTE — ED Notes (Signed)
Patient removed her armband. New armband printed and placed on her R wrist. Patient informed she must keep this on while she is a patient in the ER.

## 2013-01-19 NOTE — Progress Notes (Signed)
C. Kiondra Caicedo, MHT followed up with [facility] and pt was denied by physician for admittance and [facility] did receive paperwork on pt and social worker is currently reviewing.

## 2013-01-19 NOTE — Progress Notes (Signed)
Pt was denied at [facility] due to chronic acuity and request for a long term program.  [facility] filled all beds prior to reviewing referral.  [facility] received the fax and will review for wait list.

## 2013-01-20 DIAGNOSIS — F259 Schizoaffective disorder, unspecified: Secondary | ICD-10-CM

## 2013-01-20 MED ORDER — TRAZODONE HCL 50 MG PO TABS
50.0000 mg | ORAL_TABLET | Freq: Every evening | ORAL | Status: DC | PRN
  Administered 2013-01-20 – 2013-01-28 (×8): 50 mg via ORAL
  Filled 2013-01-20 (×9): qty 1

## 2013-01-20 NOTE — BH Assessment (Signed)
Received call from [facility]. Pt declined due to acuity.  Harlin Rain Ria Comment, [facility] Triage Specialist

## 2013-01-20 NOTE — ED Notes (Signed)
Pt reports that she is feeling anxious/tense and  is requested something for her nerves.

## 2013-01-20 NOTE — ED Notes (Signed)
On the phone 

## 2013-01-20 NOTE — ED Notes (Signed)
Up to the bathroom 

## 2013-01-20 NOTE — Progress Notes (Signed)
Pt referred to [facility] pending reivew.   Catha Gosselin, LCSW [phone]  ED CSW .01/20/2013 1404pm

## 2013-01-20 NOTE — ED Notes (Signed)
Up to the desk requesting something for pain

## 2013-01-20 NOTE — Progress Notes (Signed)
Underwriter faxed pt UA documents to [facility] at their request at 2020 with successful confirmation.  IVC paperwork still needs to be faxed when completed to [facility].

## 2013-01-20 NOTE — ED Notes (Signed)
Pt reports that she is not taking latuda any longer and is now taking zoloft, inform NP/MD

## 2013-01-20 NOTE — Consult Note (Addendum)
Patient Identification:  [name] Date of Evaluation:  01/20/2013   History of Present Illness: Patient is waiting for placement at [facility].  Patient is seeking treatment for visual and auditory hallucination seeing herself jump off the bridge.  Patient states her symptoms occured due to family stress when she was sent out of her grandmothers home.  Patient is now homeless and does not have contacts with her children.  We have started referral to [facility] for long term treatment.   Past Psychiatric History: Schizoaffective d/o, Bipolar type.   Past Medical History:     Past Medical History  Diagnosis Date  . Depression   . Bipolar 1 disorder   . Hypertension   . Hemorrhoid   . Transfusion history   . Schizophrenia        Past Surgical History  Procedure Laterality Date  . Cesarean section      Allergies:  Allergies  Allergen Reactions  . Flagyl [Metronidazole Hcl] Nausea Only  . Sulfa Antibiotics Nausea Only    nausea    Current Medications:  Prior to Admission medications   Medication Sig Start Date End Date Taking? Authorizing Provider  ARIPiprazole (ABILIFY) 10 MG tablet Take 10 mg by mouth daily.   Yes Historical Provider, MD  diphenhydrAMINE (BENADRYL) 25 mg capsule Take 25 mg by mouth every 6 (six) hours as needed (sleep).    Yes Historical Provider, MD  ibuprofen (ADVIL,MOTRIN) 200 MG tablet Take 200 mg by mouth every 6 (six) hours as needed for pain.   Yes Historical Provider, MD  traMADol (ULTRAM) 50 MG tablet Take 50 mg by mouth every 6 (six) hours as needed for pain.   Yes Historical Provider, MD    Social History:    reports that she has been smoking Cigarettes.  She has a 17 pack-year smoking history. She has never used smokeless tobacco. She reports that she does not drink alcohol or use illicit drugs.   Family History:    Family History  Problem Relation Age of Onset  . Diabetes type II Other     Mental Status Examination/Evaluation: Psychiatric  Specialty Exam: @PHYSEXAMBYAGE2 @  @ROS @  Blood pressure 122/84, pulse 90, temperature 97.9 F (36.6 C), temperature source Oral, resp. rate 20, height 5\' 4"  (1.626 m), weight 96.163 kg (212 lb), last menstrual period 01/11/2013, SpO2 98.00%.Body mass index is 36.37 kg/(m^2).  General Appearance: Casual  Eye Contact::  Good  Speech:  Clear and Coherent and Normal Rate  Volume:  Normal  Mood:  Depressed, Dysphoric, Hopeless and Worthless  Affect:  Congruent, Depressed, Flat, Labile and Tearful  Thought Process:  Coherent and Intact  Orientation:  Full (Time, Place, and Person)  Thought Content:  Hallucinations: Auditory Command:  voices telling her to jump off the bridge , visual hallucination seeing herself jump off the bridge.  Suicidal Thoughts:  Yes.  with intent/plan  Homicidal Thoughts:  No  Memory:  Immediate;   Fair Recent;   Fair Remote;   Fair  Judgement:  Poor  Insight:  Fair  Psychomotor Activity:  Normal  Concentration:  Good  Recall:  NA  Akathisia:  NA  Handed:  Right  AIMS (if indicated):     Assets:  Desire for Improvement  Sleep:          DIAGNOSIS:   AXIS I   Schizoaffective d/o , bipolar type  AXIS II  Deffered  AXIS III See medical notes.  AXIS IV housing problems, occupational problems, other psychosocial or  environmental problems, problems related to social environment and problems with primary support group  AXIS V 21-30 behavior considerably influenced by delusions or hallucinations OR serious impairment in judgment, communication OR inability to function in almost all areas     Assessment/Plan:   Face to face interview with Dr Elsie Saas We will continue to wait for placement to [facility] We will continue to administer her home medications We will continue to provide safety and stabilization.    Reviewed the information documented and agree with the treatment plan.  Jet Armbrust,JANARDHAHA R. 01/20/2013 5:11 PM

## 2013-01-21 ENCOUNTER — Encounter (HOSPITAL_COMMUNITY): Payer: Self-pay | Admitting: Registered Nurse

## 2013-01-21 NOTE — Progress Notes (Signed)
Per [facility], Azucena Kuba on waiting list.  Mariann Laster [phone]     ED CSW  01/21/2013 11:42am

## 2013-01-21 NOTE — Progress Notes (Signed)
CSW called magistrate to confirm receipt of IVC paperwork for pt, which was sent earlier not by this CSW. Magistrate Statistician) confirmed that he had received it.   York Spaniel [place], [phone]     ED CSW  01/21/2013 10:17am

## 2013-01-21 NOTE — ED Notes (Signed)
Patient presents depressed affect congruent complaining of increased fatigue and all over body aches. Continues to endorse feelings of hopelessness and self harm. Denies any homicidal thoughts also denies any current auditory or visual hallucinations.

## 2013-01-21 NOTE — ED Notes (Signed)
Up to the bathroom 

## 2013-01-21 NOTE — ED Notes (Signed)
Up on the phone 

## 2013-01-21 NOTE — BH Assessment (Signed)
[facility] Assessment Progress Note  Update:  Pt confirmed on [facility] wait list per Nina @ 1355.     

## 2013-01-21 NOTE — ED Notes (Signed)
Up to the desk on the phone 

## 2013-01-21 NOTE — Consult Note (Signed)
Patient Identification:  [name] Date of Evaluation:  01/21/2013   History of Present Illness: Patient resting in bed.  [name] states that she is sad.  Patient continues to seek treatment for visual and auditory hallucination seeing herself jump off the bridge.  Patient states that she does not want to take Jordan; States that she was taking Zoloft.   Past Psychiatric History: Schizoaffective d/o, Bipolar type.   Past Medical History:     Past Medical History  Diagnosis Date  . Depression   . Bipolar 1 disorder   . Hypertension   . Hemorrhoid   . Transfusion history   . Schizophrenia        Past Surgical History  Procedure Laterality Date  . Cesarean section      Allergies:  Allergies  Allergen Reactions  . Flagyl [Metronidazole Hcl] Nausea Only  . Sulfa Antibiotics Nausea Only    nausea    Current Medications:  Prior to Admission medications   Medication Sig Start Date End Date Taking? Authorizing Provider  ARIPiprazole (ABILIFY) 10 MG tablet Take 10 mg by mouth daily.   Yes Historical Provider, MD  diphenhydrAMINE (BENADRYL) 25 mg capsule Take 25 mg by mouth every 6 (six) hours as needed (sleep).    Yes Historical Provider, MD  ibuprofen (ADVIL,MOTRIN) 200 MG tablet Take 200 mg by mouth every 6 (six) hours as needed for pain.   Yes Historical Provider, MD  traMADol (ULTRAM) 50 MG tablet Take 50 mg by mouth every 6 (six) hours as needed for pain.   Yes Historical Provider, MD    Social History:    reports that she has been smoking Cigarettes.  She has a 17 pack-year smoking history. She has never used smokeless tobacco. She reports that she does not drink alcohol or use illicit drugs.   Family History:    Family History  Problem Relation Age of Onset  . Diabetes type II Other     Mental Status Examination/Evaluation: Psychiatric Specialty Exam: @PHYSEXAMBYAGE2 @  @ROS @  Blood pressure 113/77, pulse 60, temperature 98.2 F (36.8 C), temperature source  Oral, resp. rate 17, height 5\' 4"  (1.626 m), weight 96.163 kg (212 lb), last menstrual period 01/11/2013, SpO2 98.00%.Body mass index is 36.37 kg/(m^2).  General Appearance: Casual  Eye Contact::  Good  Speech:  Clear and Coherent and Normal Rate  Volume:  Normal  Mood:  Depressed, Dysphoric, Hopeless and Worthless  Affect:  Congruent, Depressed, Flat, Labile and Tearful  Thought Process:  Coherent and Intact  Orientation:  Full (Time, Place, and Person)  Thought Content:  Hallucinations: Auditory Command:  voices telling her to jump off the bridge , visual hallucination seeing herself jump off the bridge.  Suicidal Thoughts:  Yes.  with intent/plan  Homicidal Thoughts:  No  Memory:  Immediate;   Fair Recent;   Fair Remote;   Fair  Judgement:  Poor  Insight:  Fair  Psychomotor Activity:  Normal  Concentration:  Good  Recall:  NA  Akathisia:  NA  Handed:  Right  AIMS (if indicated):     Assets:  Desire for Improvement  Sleep:       DIAGNOSIS:   AXIS I   Schizoaffective d/o , bipolar type  AXIS II  Deferred  AXIS III See medical notes.  AXIS IV housing problems, occupational problems, other psychosocial or environmental problems, problems related to social environment and problems with primary support group  AXIS V 21-30 behavior considerably influenced by delusions or hallucinations  OR serious impairment in judgment, communication OR inability to function in almost all areas   Face to face interview with Dr [name]   Assessment/Plan:    We will continue to wait for placement to [facility] We will continue to administer her home medications We will continue to provide safety and stabilization  [name], [place], FNP-BC 01/21/2013 11:53 AM  Reviewed the information documented and agree with the treatment plan.  [name],[name]. 01/21/2013 3:01 PM

## 2013-01-21 NOTE — ED Notes (Signed)
Patient presents depressed with flat affect; calm, pleasant and cooperative. Continues to endorse self harm thoughts. Denies any current pain.

## 2013-01-22 DIAGNOSIS — F315 Bipolar disorder, current episode depressed, severe, with psychotic features: Secondary | ICD-10-CM

## 2013-01-22 MED ORDER — ARIPIPRAZOLE 10 MG PO TABS
10.0000 mg | ORAL_TABLET | Freq: Every day | ORAL | Status: DC
  Administered 2013-01-23 – 2013-01-29 (×7): 10 mg via ORAL
  Filled 2013-01-22 (×8): qty 1

## 2013-01-22 NOTE — ED Provider Notes (Signed)
Medical screening examination/treatment/procedure(s) were performed by non-physician practitioner and as supervising physician I was immediately available for consultation/collaboration. Devoria Albe, MD, Armando Gang   Ward Givens, MD 01/22/13 [phone]

## 2013-01-22 NOTE — Consult Note (Signed)
Patient Identification:  [name] Date of Evaluation:  01/22/2013   History of Present Illness :Patient was seen today with Dr Lolly Mustache.  She is waiting for acceptance at [facility] for long term treatment.  Patient came in for visual hallucination seeing herself jump off the bridge.  Patient states she is unable to contract for safety and that she is still hearing voices.  We will add Abilify to her medications while we continue to wait for [facility] acceptance.   Past Psychiatric History: Schizoaffective d/o, bipolar type, depressed.   Past Medical History:     Past Medical History  Diagnosis Date  . Depression   . Bipolar 1 disorder   . Hypertension   . Hemorrhoid   . Transfusion history   . Schizophrenia        Past Surgical History  Procedure Laterality Date  . Cesarean section      Allergies:  Allergies  Allergen Reactions  . Flagyl [Metronidazole Hcl] Nausea Only  . Sulfa Antibiotics Nausea Only    nausea    Current Medications:  Prior to Admission medications   Medication Sig Start Date End Date Taking? Authorizing Provider  ARIPiprazole (ABILIFY) 10 MG tablet Take 10 mg by mouth daily.   Yes Historical Provider, MD  diphenhydrAMINE (BENADRYL) 25 mg capsule Take 25 mg by mouth every 6 (six) hours as needed (sleep).    Yes Historical Provider, MD  ibuprofen (ADVIL,MOTRIN) 200 MG tablet Take 200 mg by mouth every 6 (six) hours as needed for pain.   Yes Historical Provider, MD  traMADol (ULTRAM) 50 MG tablet Take 50 mg by mouth every 6 (six) hours as needed for pain.   Yes Historical Provider, MD    Social History:    reports that she has been smoking Cigarettes.  She has a 17 pack-year smoking history. She has never used smokeless tobacco. She reports that she does not drink alcohol or use illicit drugs.   Family History:    Family History  Problem Relation Age of Onset  . Diabetes type II Other     Mental Status Examination/Evaluation:  Psychiatric Specialty  Exam: @PHYSEXAMBYAGE2 @  @ROS @  Blood pressure 137/88, pulse 61, temperature 97.6 F (36.4 C), temperature source Oral, resp. rate 17, height 5\' 4"  (1.626 m), weight 96.163 kg (212 lb), last menstrual period 01/11/2013, SpO2 99.00%.Body mass index is 36.37 kg/(m^2).  General Appearance: Casual  Eye Contact::  Fair  Speech:  Clear and Coherent and Normal Rate  Volume:  Normal  Mood:  Anxious, Depressed, Dysphoric, Hopeless and Worthless  Affect:  Congruent, Depressed and Flat  Thought Process:  Coherent and Intact  Orientation:  Full (Time, Place, and Person)  Thought Content:  Hallucinations: Auditory Visual  Suicidal Thoughts:  Yes.  with intent/plan  Homicidal Thoughts:  No  Memory:  Immediate;   Fair Recent;   Fair Remote;   Fair  Judgement:  Poor  Insight:  Lacking and Shallow  Psychomotor Activity:  Normal  Concentration:  Fair  Recall:  NA  Akathisia:  NA  Handed:  Right  AIMS (if indicated):     Assets:  Desire for Improvement  Sleep:          DIAGNOSIS:   AXIS I   Schizoaffective d/o, bipolar type depressed with Psychosis  AXIS II  Deffered  AXIS III See medical notes.  AXIS IV housing problems, occupational problems, other psychosocial or environmental problems, problems related to social environment and problems with primary support group  AXIS V 21-30 behavior considerably influenced by delusions or hallucinations OR serious impairment in judgment, communication OR inability to function in almost all areas     Assessment/Plan:  Face to face interview with Dr Lolly Mustache We will continue with our plan of care re-[facility] placement We will restart her Abilify 10 mg daily for treatment of her Psychosis.   I have personally seen the patient and agreed with the findings and involved in the treatment plan. Kathryne Sharper, MD

## 2013-01-22 NOTE — Progress Notes (Signed)
Confirmed pt on waitlist for [facility] with Merlyn Albert.  Blain Pais, MHT/NS

## 2013-01-22 NOTE — ED Notes (Signed)
Patient states that she has a strange discharge with a foul odor.  Pt states that her last sexual encounter was 2 weeks ago, however this is the first time that she noticed the discharge.  Pt did have unprotected sex at that time.

## 2013-01-23 ENCOUNTER — Encounter (HOSPITAL_COMMUNITY): Payer: Self-pay | Admitting: Registered Nurse

## 2013-01-23 LAB — GC/CHLAMYDIA PROBE AMP: GC Probe RNA: NEGATIVE

## 2013-01-23 LAB — WET PREP, GENITAL
WBC, Wet Prep HPF POC: NONE SEEN
Yeast Wet Prep HPF POC: NONE SEEN

## 2013-01-23 LAB — RPR: RPR Ser Ql: NONREACTIVE

## 2013-01-23 NOTE — Consult Note (Signed)
Note reviewed and agreed with  

## 2013-01-23 NOTE — ED Provider Notes (Signed)
[name] is a 42 y.o. female who has been here for evaluation of a psychiatric disorder for several days. She told the nurse, today, that she had a vaginal discharge. She denies abdominal pain. There's been no fever, and vital signs have been normal.  Exam alert, cooperative Pelvic- normal external female genitalia. Thick, yellow vaginal discharge. No bleeding in the vagina. No cervical motion tenderness. No midline tenderness. No uterine enlargement. No adnexal mass or organ enlargement. No adnexal tenderness.  Assessment: Nonspecific vaginal discharge. Wet prep is normal. We'll await testing for STDs. No changes in Psychiatric treatment plan.     Flint Melter, MD 01/23/13 [phone]

## 2013-01-23 NOTE — Consult Note (Signed)
Patient Identification:  [name] Date of Evaluation:  01/23/2013   History of Present Illness :Patient states that she is feeling "Okay."  Patient continues to endorse suicidal ideation. Patient denies hallucinations at this time.  Patient states that she is eating and sleeping "Good."  Will continue with current treatment plan.  Past Psychiatric History: Schizoaffective d/o, bipolar type, depressed.   Past Medical History:     Past Medical History  Diagnosis Date  . Depression   . Bipolar 1 disorder   . Hypertension   . Hemorrhoid   . Transfusion history   . Schizophrenia        Past Surgical History  Procedure Laterality Date  . Cesarean section      Allergies:  Allergies  Allergen Reactions  . Flagyl [Metronidazole Hcl] Nausea Only  . Sulfa Antibiotics Nausea Only    nausea    Current Medications:  Prior to Admission medications   Medication Sig Start Date End Date Taking? Authorizing Provider  ARIPiprazole (ABILIFY) 10 MG tablet Take 10 mg by mouth daily.   Yes Historical Provider, MD  diphenhydrAMINE (BENADRYL) 25 mg capsule Take 25 mg by mouth every 6 (six) hours as needed (sleep).    Yes Historical Provider, MD  ibuprofen (ADVIL,MOTRIN) 200 MG tablet Take 200 mg by mouth every 6 (six) hours as needed for pain.   Yes Historical Provider, MD  traMADol (ULTRAM) 50 MG tablet Take 50 mg by mouth every 6 (six) hours as needed for pain.   Yes Historical Provider, MD    Social History:    reports that she has been smoking Cigarettes.  She has a 17 pack-year smoking history. She has never used smokeless tobacco. She reports that she does not drink alcohol or use illicit drugs.   Family History:    Family History  Problem Relation Age of Onset  . Diabetes type II Other     Mental Status Examination/Evaluation:  Psychiatric Specialty Exam: @PHYSEXAMBYAGE2 @  @ROS @  Blood pressure 100/66, pulse 60, temperature 98.6 F (37 C), temperature source Oral, resp.  rate 15, height 5\' 4"  (1.626 m), weight 96.163 kg (212 lb), last menstrual period 01/11/2013, SpO2 100.00%.Body mass index is 36.37 kg/(m^2).  General Appearance: Casual  Eye Contact::  Fair  Speech:  Clear and Coherent and Normal Rate  Volume:  Normal  Mood:  Anxious, Depressed, Dysphoric, Hopeless and Worthless  Affect:  Congruent, Depressed and Flat  Thought Process:  Coherent and Intact  Orientation:  Full (Time, Place, and Person)  Thought Content:  Hallucinations: Auditory Visual.  Denies at this time  Suicidal Thoughts:  Yes.  with intent/plan  Homicidal Thoughts:  No  Memory:  Immediate;   Fair Recent;   Fair Remote;   Fair  Judgement:  Poor  Insight:  Lacking and Shallow  Psychomotor Activity:  Normal  Concentration:  Fair  Recall:  NA  Akathisia:  NA  Handed:  Right  AIMS (if indicated):     Assets:  Desire for Improvement  Sleep:          DIAGNOSIS:   AXIS I   Schizoaffective d/o, bipolar type depressed with Psychosis  AXIS II  Deffered  AXIS III See medical notes.  AXIS IV housing problems, occupational problems, other psychosocial or environmental problems, problems related to social environment and problems with primary support group  AXIS V 21-30 behavior considerably influenced by delusions or hallucinations OR serious impairment in judgment, communication OR inability to function in almost all  areas   Face to face interview and consult with Dr. Ladona Ridgel  Assessment/Plan:  We will continue with our plan of care re-[facility] placement Continue Abilify 10 mg daily for treatment of her Psychosis.  Shuvon B. Rankin FNP-BC Family Nurse Practitioner, Board Certified

## 2013-01-23 NOTE — Progress Notes (Signed)
Per nurse, pt requested to speak with CSW.  Pt had questions about housing, child custody and termination of parental rights.  CSW discussed with patient resources that might be able to help her with her questions.  Pt concerned that she has been treated unfairly concerning her children because of her mental illness.  CSW discussed with pt things that she could do to advocate for herself in addition to resources.  CSW talked with patient about [facility] and that it might be a resource for her to start with once she was healthy.  Pt thanked CSW for support.  Marva Panda, LCSWA  [phone] .01/23/2013 5:00 pm

## 2013-01-23 NOTE — ED Notes (Signed)
Pt states that nicotine patch irritated her arm.  Patch removed.

## 2013-01-23 NOTE — ED Notes (Signed)
Pt states that she talked to her ex-husband today and he told her that she could not see her son anymore.  Pt states that this has caused her anxiety.

## 2013-01-24 NOTE — Consult Note (Signed)
Patient Identification:  [name] Date of Evaluation:  01/24/2013   History of Present Illness :Patient states that she is feeling better today.  Patient denies at this time suicidal ideation, homicidal ideation, psychosis, and paranoia.  Patient states that she has the shakes.  Patient states that she slept well last night and is eating okay.  Past Psychiatric History: Schizoaffective d/o, bipolar type, depressed.   Past Medical History:     Past Medical History  Diagnosis Date  . Depression   . Bipolar 1 disorder   . Hypertension   . Hemorrhoid   . Transfusion history   . Schizophrenia        Past Surgical History  Procedure Laterality Date  . Cesarean section      Allergies:  Allergies  Allergen Reactions  . Flagyl [Metronidazole Hcl] Nausea Only  . Sulfa Antibiotics Nausea Only    nausea    Current Medications:  Prior to Admission medications   Medication Sig Start Date End Date Taking? Authorizing Provider  ARIPiprazole (ABILIFY) 10 MG tablet Take 10 mg by mouth daily.   Yes Historical Provider, MD  diphenhydrAMINE (BENADRYL) 25 mg capsule Take 25 mg by mouth every 6 (six) hours as needed (sleep).    Yes Historical Provider, MD  ibuprofen (ADVIL,MOTRIN) 200 MG tablet Take 200 mg by mouth every 6 (six) hours as needed for pain.   Yes Historical Provider, MD  traMADol (ULTRAM) 50 MG tablet Take 50 mg by mouth every 6 (six) hours as needed for pain.   Yes Historical Provider, MD    Social History:    reports that she has been smoking Cigarettes.  She has a 17 pack-year smoking history. She has never used smokeless tobacco. She reports that she does not drink alcohol or use illicit drugs.   Family History:    Family History  Problem Relation Age of Onset  . Diabetes type II Other     Mental Status Examination/Evaluation:  Psychiatric Specialty Exam: @PHYSEXAMBYAGE2 @  @ROS @  Blood pressure 111/69, pulse 62, temperature 98.2 F (36.8 C), temperature  source Oral, resp. rate 16, height 5\' 4"  (1.626 m), weight 96.163 kg (212 lb), last menstrual period 01/11/2013, SpO2 98.00%.Body mass index is 36.37 kg/(m^2).  General Appearance: Casual  Eye Contact::  Fair  Speech:  Clear and Coherent and Normal Rate  Volume:  Normal  Mood:  Anxious, Depressed, Dysphoric, Hopeless and Worthless  Affect:  Congruent, Depressed and Flat  Thought Process:  Coherent and Intact  Orientation:  Full (Time, Place, and Person)  Thought Content:  Hallucinations: Auditory Visual.  Denies at this time  Suicidal Thoughts:  Yes.  with intent/plan  Homicidal Thoughts:  No  Memory:  Immediate;   Fair Recent;   Fair Remote;   Fair  Judgement:  Poor  Insight:  Lacking and Shallow  Psychomotor Activity:  Normal  Concentration:  Fair  Recall:  NA  Akathisia:  NA  Handed:  Right  AIMS (if indicated):     Assets:  Desire for Improvement  Sleep:          DIAGNOSIS:   AXIS I   Schizoaffective d/o, bipolar type depressed with Psychosis  AXIS II  Deferred  AXIS III See medical notes.  AXIS IV housing problems, occupational problems, other psychosocial or environmental problems, problems related to social environment and problems with primary support group  AXIS V 21-30 behavior considerably influenced by delusions or hallucinations OR serious impairment in judgment, communication OR  inability to function in almost all areas   Face to face interview and consult with Dr. Lolly Mustache  Assessment/Plan:  We will continue with our plan of care re-[facility] placement Continue Abilify 10 mg daily for treatment of her Psychosis.  Shuvon B. Rankin FNP-BC Family Nurse Practitioner, Board Certified  I agreed with the findings, treatment and disposition plan of this patient. Kathryne Sharper, MD

## 2013-01-25 MED ORDER — HYDROXYZINE HCL 25 MG PO TABS
50.0000 mg | ORAL_TABLET | Freq: Once | ORAL | Status: AC
  Administered 2013-01-25: 50 mg via ORAL
  Filled 2013-01-25: qty 2

## 2013-01-25 NOTE — BH Assessment (Signed)
Per Coralee North, pt still on [facility] wait list.  Evette Cristal, [facility] Assessment Counselor

## 2013-01-25 NOTE — Consult Note (Signed)
  Psychiatric Specialty Exam: Physical Exam  ROS  Blood pressure 100/67, pulse 60, temperature 98.3 F (36.8 C), temperature source Oral, resp. rate 14, height 5\' 4"  (1.626 m), weight 96.163 kg (212 lb), last menstrual period 01/11/2013, SpO2 93.00%.Body mass index is 36.37 kg/(m^2).  General Appearance: Well Groomed  Patent attorney::  Good  Speech:  Clear and Coherent  Volume:  Normal  Mood:  Euthymic  Affect:  Appropriate  Thought Process:  Coherent  Orientation:  Full (Time, Place, and Person)  Thought Content:  Negative  Suicidal Thoughts:  No  Homicidal Thoughts:  No  Memory:  Immediate;   Good Recent;   Good Remote;   Good  Judgement:  Intact  Insight:  Lacking  Psychomotor Activity:  Normal  Concentration:  Good  Recall:  Good  Akathisia:  Negative  Handed:  Right  AIMS (if indicated):     Assets:  Desire for Improvement  Sleep:   adequate   Ms Mandrell denies any voices or suicidal thoughts.  She is afraid if she is discharged she will regress as she has been in and out of here so many times in the past.  Will continue the same disposition of referral to [facility].

## 2013-01-26 DIAGNOSIS — F411 Generalized anxiety disorder: Secondary | ICD-10-CM

## 2013-01-26 DIAGNOSIS — F329 Major depressive disorder, single episode, unspecified: Secondary | ICD-10-CM

## 2013-01-26 MED ORDER — HYDROXYZINE HCL 25 MG PO TABS
50.0000 mg | ORAL_TABLET | Freq: Four times a day (QID) | ORAL | Status: DC | PRN
  Administered 2013-01-26 – 2013-01-28 (×6): 50 mg via ORAL
  Filled 2013-01-26 (×6): qty 2

## 2013-01-26 NOTE — Progress Notes (Signed)
Underwriter spoke with [name] at [facility] and confirmed pt is on the wait list.  Blain Pais, MHT/NS

## 2013-01-26 NOTE — ED Notes (Signed)
Pt has a bank card that was brought by a family, Staff put in locker # [phone] with security

## 2013-01-26 NOTE — Consult Note (Signed)
  Psychiatric Specialty Exam: Physical Exam  ROS  Blood pressure 102/65, pulse 62, temperature 98.2 F (36.8 C), temperature source Oral, resp. rate 12, height 5\' 4"  (1.626 m), weight 96.163 kg (212 lb), last menstrual period 01/11/2013, SpO2 97.00%.Body mass index is 36.37 kg/(m^2).  General Appearance: Disheveled  Eye Contact::  Good  Speech:  Clear and Coherent  Volume:  Decreased  Mood:  Depressed  Affect:  Appropriate  Thought Process:  logical  Orientation:  Full (Time, Place, and Person)  Thought Content:  Negative  Suicidal Thoughts:  No  Homicidal Thoughts:  No  Memory:  Immediate;   Good Recent;   Good Remote;   Good  Judgement:  Intact  Insight:  Lacking  Psychomotor Activity:  Normal  Concentration:  Good  Recall:  Good  Akathisia:  Negative  Handed:  Right  AIMS (if indicated):     Assets:  Desire for Improvement  Sleep:   too much  Ms Nott remains depressed does not want to go home and is awaiting a bed at [facility].  She complains of anxiety and says Ativan helps.

## 2013-01-27 ENCOUNTER — Encounter (HOSPITAL_COMMUNITY): Payer: Self-pay | Admitting: Psychiatry

## 2013-01-27 DIAGNOSIS — F332 Major depressive disorder, recurrent severe without psychotic features: Secondary | ICD-10-CM

## 2013-01-27 DIAGNOSIS — F419 Anxiety disorder, unspecified: Secondary | ICD-10-CM | POA: Diagnosis present

## 2013-01-27 MED ORDER — ACETAMINOPHEN 325 MG PO TABS
650.0000 mg | ORAL_TABLET | Freq: Four times a day (QID) | ORAL | Status: DC | PRN
  Administered 2013-01-27: 650 mg via ORAL
  Filled 2013-01-27: qty 2

## 2013-01-27 NOTE — ED Notes (Signed)
Up tot he bathroom to shower and change scrubs 

## 2013-01-27 NOTE — Progress Notes (Signed)
Per Elonda Husky, pt still on [facility] waiting list.  Mariann Laster, [phone]     ED CSW  4:00pm

## 2013-01-27 NOTE — ED Notes (Signed)
Up to the desk on the phone 

## 2013-01-27 NOTE — Consult Note (Signed)
[facility] Face-to-Face Psychiatry Consult   Reason for Consult:  Suicidal ideations Referring Physician:  ER MD [name] is an 42 y.o. female.  Assessment: AXIS I:  Anxiety Disorder NOS, Major Depression, Recurrent severe and Schizoaffective Disorder AXIS II:  Deferred AXIS III:   Past Medical History  Diagnosis Date  . Depression   . Bipolar 1 disorder   . Hypertension   . Hemorrhoid   . Transfusion history   . Schizophrenia    AXIS IV:  economic problems, housing problems, other psychosocial or environmental problems, problems related to social environment and problems with primary support group AXIS V:  41-50 serious symptoms  Plan:  Recommend psychiatric Inpatient admission when medically cleared.  Subjective:   [name] is a 42 y.o. female patient admitted with suicidal ideations, awaiting [facility] wait list.  HPI:  Patient remains suicidal with visions of jumping off a bridge, guarded, anger issues but denies violence---curses but not physical alterations.  Compliant with medications. HPI Elements:   Location:  generalized. Quality:  acute. Severity:  severe. Timing:  constant. Duration:  two weeks. Context:  stressors.  Past Psychiatric History: Past Medical History  Diagnosis Date  . Depression   . Bipolar 1 disorder   . Hypertension   . Hemorrhoid   . Transfusion history   . Schizophrenia     reports that she has been smoking Cigarettes.  She has a 17 pack-year smoking history. She has never used smokeless tobacco. She reports that she does not drink alcohol or use illicit drugs. Family History  Problem Relation Age of Onset  . Diabetes type II Other            Allergies:   Allergies  Allergen Reactions  . Flagyl [Metronidazole Hcl] Nausea Only  . Sulfa Antibiotics Nausea Only    nausea    ACT Assessment Complete:  Yes:    Educational Status    Risk to Self: Risk to self Is patient at risk for suicide?: No Substance abuse history and/or  treatment for substance abuse?: No  Risk to Others:    Abuse:    Prior Inpatient Therapy:    Prior Outpatient Therapy:    Additional Information:                    Objective: Blood pressure 134/79, pulse 62, temperature 97.6 F (36.4 C), temperature source Oral, resp. rate 20, height 5\' 4"  (1.626 m), weight 212 lb (96.163 kg), last menstrual period 01/11/2013, SpO2 98.00%.Body mass index is 36.37 kg/(m^2).No results found for this or any previous visit (from the past 72 hour(s)). Labs are reviewed and are pertinent for no medical issues.  Current Facility-Administered Medications  Medication Dose Route Frequency Provider Last Rate Last Dose  . acetaminophen (TYLENOL) tablet 650 mg  650 mg Oral Q6H PRN Sunnie Nielsen, MD   650 mg at 01/27/13 0211  . alum & mag hydroxide-simeth (MAALOX/MYLANTA) 200-200-20 MG/5ML suspension 30 mL  30 mL Oral PRN Trevor Mace, PA-C      . ARIPiprazole (ABILIFY) tablet 10 mg  10 mg Oral Daily Earney Navy, NP   10 mg at 01/27/13 1040  . hydrOXYzine (ATARAX/VISTARIL) tablet 50 mg  50 mg Oral Q6H PRN Court Joy, PA-C   50 mg at 01/27/13 0211  . ibuprofen (ADVIL,MOTRIN) tablet 600 mg  600 mg Oral Q8H PRN Trevor Mace, PA-C   600 mg at 01/26/13 2111  . LORazepam (ATIVAN) tablet 1 mg  1 mg Oral Q8H PRN Trevor Mace, PA-C   1 mg at 01/27/13 0148  . nicotine (NICODERM CQ - dosed in mg/24 hours) patch 21 mg  21 mg Transdermal Daily Trevor Mace, PA-C   21 mg at 01/27/13 1040  . ondansetron (ZOFRAN) tablet 4 mg  4 mg Oral Q8H PRN Trevor Mace, PA-C      . sertraline (ZOLOFT) tablet 50 mg  50 mg Oral Daily Earney Navy, NP   50 mg at 01/27/13 1040  . traZODone (DESYREL) tablet 50 mg  50 mg Oral QHS PRN Hilario Quarry, MD   50 mg at 01/26/13 2110  . zolpidem (AMBIEN) tablet 5 mg  5 mg Oral QHS PRN Trevor Mace, PA-C   5 mg at 01/22/13 2217   Current Outpatient Prescriptions  Medication Sig Dispense Refill  . ARIPiprazole  (ABILIFY) 10 MG tablet Take 10 mg by mouth daily.      . diphenhydrAMINE (BENADRYL) 25 mg capsule Take 25 mg by mouth every 6 (six) hours as needed (sleep).       Marland Kitchen ibuprofen (ADVIL,MOTRIN) 200 MG tablet Take 200 mg by mouth every 6 (six) hours as needed for pain.      . traMADol (ULTRAM) 50 MG tablet Take 50 mg by mouth every 6 (six) hours as needed for pain.        Psychiatric Specialty Exam:     Blood pressure 134/79, pulse 62, temperature 97.6 F (36.4 C), temperature source Oral, resp. rate 20, height 5\' 4"  (1.626 m), weight 212 lb (96.163 kg), last menstrual period 01/11/2013, SpO2 98.00%.Body mass index is 36.37 kg/(m^2).  General Appearance: Casual  Eye Contact::  Fair  Speech:  Normal Rate and Slow  Volume:  Normal  Mood:  Depressed  Affect:  Congruent  Thought Process:  Coherent  Orientation:  Full (Time, Place, and Person)  Thought Content:  WDL  Suicidal Thoughts:  Yes.  with intent/plan  Homicidal Thoughts:  No  Memory:  Immediate;   Fair Recent;   Fair Remote;   Fair  Judgement:  Poor  Insight:  Lacking  Psychomotor Activity:  Decreased  Concentration:  Fair  Recall:  Fair  Akathisia:  No  Handed:  Right  AIMS (if indicated):     Assets:  Resilience  Sleep:      Treatment Plan Summary: Daily contact with patient to assess and evaluate symptoms and progress in treatment Medication management,   Continue current treatment plan Awaiting [facility] bed.  Nanine Means, PMH-NP 01/27/2013 10:57 AM

## 2013-01-27 NOTE — ED Notes (Signed)
Request visteril for her nerves, also reports that her arthritis pain is starting to flare up

## 2013-01-27 NOTE — ED Notes (Signed)
Up to the bathroom 

## 2013-01-27 NOTE — ED Notes (Signed)
Patient presents calm, cooperative and pleasant. Currently denies any thoughts of self harm or thoughts of harming anyone else.  Patient complains of vision of seeing herself fall in front of bedside table. She states that these visions are new for her. Denies any current depressive symptoms. Does continue to complain of anxiety and chronic arthritis pain.

## 2013-01-27 NOTE — ED Notes (Signed)
Rita TTS into see

## 2013-01-27 NOTE — ED Notes (Addendum)
NAD, watching tv. Had requested some things for her nerves

## 2013-01-27 NOTE — ED Notes (Signed)
Up in the hall talking to TTS [place]

## 2013-01-27 NOTE — ED Notes (Signed)
Up in the hall talking w/ other pt 

## 2013-01-28 ENCOUNTER — Encounter (HOSPITAL_COMMUNITY): Payer: Self-pay | Admitting: Registered Nurse

## 2013-01-28 MED ORDER — CLOTRIMAZOLE 1 % EX CREA
TOPICAL_CREAM | Freq: Two times a day (BID) | CUTANEOUS | Status: DC
  Administered 2013-01-28 – 2013-01-29 (×2): via TOPICAL
  Filled 2013-01-28: qty 15

## 2013-01-28 NOTE — Consult Note (Signed)
[facility] Face-to-Face Psychiatry Consult   Reason for Consult:  Suicidal ideations Referring Physician:  ER MD [name] is an 42 y.o. female.  Assessment: AXIS I:  Anxiety Disorder NOS, Major Depression, Recurrent severe and Schizoaffective Disorder AXIS II:  Deferred AXIS III:   Past Medical History  Diagnosis Date  . Depression   . Bipolar 1 disorder   . Hypertension   . Hemorrhoid   . Transfusion history   . Schizophrenia    AXIS IV:  economic problems, housing problems, other psychosocial or environmental problems, problems related to social environment and problems with primary support group AXIS V:  41-50 serious symptoms  Plan:  Recommend psychiatric Inpatient admission when medically cleared.  Subjective:   [name] is a 42 y.o. female patient admitted with suicidal ideations, awaiting [facility] wait list.  Today patient states that she still feels nervous and "I had a vision that I was hitting my head on that thing over there" (patient referring to the counter).  Patient denies homicidal ideation and auditory/visual hallucinations at this time.  Will continue to monitor for safety and stabilization until inpatient treatment bed found.    HPI Elements:   Location:  generalized. Quality:  acute. Severity:  severe. Timing:  constant. Duration:  two weeks. Context:  stressors.  Past Psychiatric History: Past Medical History  Diagnosis Date  . Depression   . Bipolar 1 disorder   . Hypertension   . Hemorrhoid   . Transfusion history   . Schizophrenia     reports that she has been smoking Cigarettes.  She has a 17 pack-year smoking history. She has never used smokeless tobacco. She reports that she does not drink alcohol or use illicit drugs. Family History  Problem Relation Age of Onset  . Diabetes type II Other            Allergies:   Allergies  Allergen Reactions  . Flagyl [Metronidazole Hcl] Nausea Only  . Sulfa Antibiotics Nausea Only    nausea     ACT Assessment Complete:  Yes:    Educational Status    Risk to Self: Risk to self Is patient at risk for suicide?: No Substance abuse history and/or treatment for substance abuse?: No  Risk to Others:    Abuse:    Prior Inpatient Therapy:    Prior Outpatient Therapy:    Additional Information:                    Objective: Blood pressure 98/68, pulse 85, temperature 97.9 F (36.6 C), temperature source Oral, resp. rate 18, height 5\' 4"  (1.626 m), weight 96.163 kg (212 lb), last menstrual period 01/11/2013, SpO2 98.00%.Body mass index is 36.37 kg/(m^2).No results found for this or any previous visit (from the past 72 hour(s)). Labs are reviewed and are pertinent for no medical issues.  Current Facility-Administered Medications  Medication Dose Route Frequency Provider Last Rate Last Dose  . acetaminophen (TYLENOL) tablet 650 mg  650 mg Oral Q6H PRN Sunnie Nielsen, MD   650 mg at 01/27/13 0211  . alum & mag hydroxide-simeth (MAALOX/MYLANTA) 200-200-20 MG/5ML suspension 30 mL  30 mL Oral PRN Trevor Mace, PA-C      . ARIPiprazole (ABILIFY) tablet 10 mg  10 mg Oral Daily Earney Navy, NP   10 mg at 01/27/13 1040  . hydrOXYzine (ATARAX/VISTARIL) tablet 50 mg  50 mg Oral Q6H PRN Court Joy, PA-C   50 mg at 01/28/13 0006  .  ibuprofen (ADVIL,MOTRIN) tablet 600 mg  600 mg Oral Q8H PRN Trevor Mace, PA-C   600 mg at 01/27/13 1751  . LORazepam (ATIVAN) tablet 1 mg  1 mg Oral Q8H PRN Trevor Mace, PA-C   1 mg at 01/27/13 2107  . nicotine (NICODERM CQ - dosed in mg/24 hours) patch 21 mg  21 mg Transdermal Daily Trevor Mace, PA-C   21 mg at 01/27/13 1040  . ondansetron (ZOFRAN) tablet 4 mg  4 mg Oral Q8H PRN Trevor Mace, PA-C      . sertraline (ZOLOFT) tablet 50 mg  50 mg Oral Daily Earney Navy, NP   50 mg at 01/27/13 1040  . traZODone (DESYREL) tablet 50 mg  50 mg Oral QHS PRN Hilario Quarry, MD   50 mg at 01/27/13 2107  . zolpidem (AMBIEN) tablet 5 mg   5 mg Oral QHS PRN Trevor Mace, PA-C   5 mg at 01/22/13 2217   Current Outpatient Prescriptions  Medication Sig Dispense Refill  . ARIPiprazole (ABILIFY) 10 MG tablet Take 10 mg by mouth daily.      . diphenhydrAMINE (BENADRYL) 25 mg capsule Take 25 mg by mouth every 6 (six) hours as needed (sleep).       Marland Kitchen ibuprofen (ADVIL,MOTRIN) 200 MG tablet Take 200 mg by mouth every 6 (six) hours as needed for pain.      . traMADol (ULTRAM) 50 MG tablet Take 50 mg by mouth every 6 (six) hours as needed for pain.        Psychiatric Specialty Exam:     Blood pressure 98/68, pulse 85, temperature 97.9 F (36.6 C), temperature source Oral, resp. rate 18, height 5\' 4"  (1.626 m), weight 96.163 kg (212 lb), last menstrual period 01/11/2013, SpO2 98.00%.Body mass index is 36.37 kg/(m^2).  General Appearance: Casual  Eye Contact::  Fair  Speech:  Normal Rate and Slow  Volume:  Normal  Mood:  Depressed  Affect:  Congruent  Thought Process:  Coherent  Orientation:  Full (Time, Place, and Person)  Thought Content:  WDL  Suicidal Thoughts:  Yes.  with intent/plan  Homicidal Thoughts:  No  Memory:  Immediate;   Fair Recent;   Fair Remote;   Fair  Judgement:  Poor  Insight:  Lacking  Psychomotor Activity:  Decreased  Concentration:  Fair  Recall:  Fair  Akathisia:  No  Handed:  Right  AIMS (if indicated):     Assets:  Resilience  Sleep:      Face to face interview and consult with Dr. Elsie Saas  Treatment Plan Summary: Daily contact with patient to assess and evaluate symptoms and progress in treatment Medication management,   Continue current treatment plan Awaiting [facility] bed.  Assunta Found, FNP-BC 01/28/2013 8:49 AM  Reviewed the information documented and agree with the treatment plan.  Keayra Graham,JANARDHAHA R. 01/28/2013 10:54 AM

## 2013-01-28 NOTE — ED Notes (Signed)
In the bathroom to shower and change scrubs 

## 2013-01-28 NOTE — ED Provider Notes (Signed)
Patient reportedly has athlete's foot. We'll treat   [name]. Rubin Payor, MD 01/28/13 1859

## 2013-01-28 NOTE — ED Notes (Signed)
Up to the bathroom 

## 2013-01-28 NOTE — ED Notes (Signed)
Pt's brother into see 

## 2013-01-28 NOTE — ED Notes (Signed)
reports some relief w/ the visteril, but requests ativan

## 2013-01-28 NOTE — ED Notes (Signed)
Shuvon NP into see 

## 2013-01-28 NOTE — BH Assessment (Signed)
Pt is declined at [facility].  Pt continues to be on wait list for [facility].  Contacted the following facilities for placement:  [facility]: At capacity [facility]: At capacity [facility]:  At capacity [facility]: At [facility]: At capacity [facility]: At capacity [facility]: At capacity  [facility]: Pt is declined. [facility]: Pt is declined. [facility]: Pt is declined.   Harlin Rain Ria Comment, [facility] Triage Specialist

## 2013-01-28 NOTE — ED Notes (Signed)
Patient presents calm , cooperative with depressed mood and sad affect; endorses feelings of self harm; states that she does not want to tell what she is thinking; states that she had a vision earlier today of seeing herself banging her head on the ledge in the room by her bed. Complains of constant arthritis pain. Denies any thoughts of wanting to hurt others.

## 2013-01-28 NOTE — Progress Notes (Signed)
Per Cassandra, pt still on [facility] wait list.  Linkin Vizzini LCSWA, [phone]     ED CSW   

## 2013-01-28 NOTE — ED Notes (Signed)
On the phone 

## 2013-01-28 NOTE — ED Notes (Signed)
Dr. Jonnalagadda into see 

## 2013-01-28 NOTE — Progress Notes (Addendum)
CSW received call from [facility]. Per Cain Sieve, papers in order and Cain Sieve to notify for papers to be served.  York Spaniel [place], [phone]     ED CSW  11:22am _____________________________________  CSW completed renewal IVC paperwork. Dr. Shela Commons petitioner.  CSW called [employer]. Per Cain Sieve, papers just coming over fax. Will call CSW back when he receives them.  York Spaniel [place], [phone]     ED CSW  11:12am

## 2013-01-28 NOTE — ED Notes (Signed)
Give visteril now--TORB Dr Rubin Payor.

## 2013-01-28 NOTE — ED Notes (Signed)
Up to the desk, requesting something for her nerves.

## 2013-01-29 MED ORDER — SERTRALINE HCL 50 MG PO TABS: 100.0000 mg | ORAL_TABLET | Freq: Every day | ORAL | Status: DC

## 2013-01-29 MED ORDER — HYDROXYZINE HCL 25 MG PO TABS
50.0000 mg | ORAL_TABLET | Freq: Three times a day (TID) | ORAL | Status: DC
  Administered 2013-01-29: 25 mg via ORAL
  Filled 2013-01-29: qty 2

## 2013-01-29 MED ORDER — ARIPIPRAZOLE 15 MG PO TABS: 15.0000 mg | ORAL_TABLET | Freq: Every day | ORAL | Status: DC

## 2013-01-29 NOTE — Consult Note (Signed)
Patient Identification:  [name] Date of Evaluation:  01/29/2013   History of Present Illness: Patient has been with [place] and is waiting for [facility] admission.  Patient came in for auditory and visual hallucination.  Today she states she is still hearing voices but they are ".lighter" .  We will continue to seek referral to [facility].  We will make some medication changes.  Past Psychiatric History: Anxiety Disorder NOS, Major Depression, Recurrent severe and Schizoaffective Disorder    Past Medical History:     Past Medical History  Diagnosis Date  . Depression   . Bipolar 1 disorder   . Hypertension   . Hemorrhoid   . Transfusion history   . Schizophrenia        Past Surgical History  Procedure Laterality Date  . Cesarean section      Allergies:  Allergies  Allergen Reactions  . Flagyl [Metronidazole Hcl] Nausea Only  . Sulfa Antibiotics Nausea Only    nausea    Current Medications:  Prior to Admission medications   Medication Sig Start Date End Date Taking? Authorizing Provider  ARIPiprazole (ABILIFY) 10 MG tablet Take 10 mg by mouth daily.   Yes Historical Provider, MD  diphenhydrAMINE (BENADRYL) 25 mg capsule Take 25 mg by mouth every 6 (six) hours as needed (sleep).    Yes Historical Provider, MD  ibuprofen (ADVIL,MOTRIN) 200 MG tablet Take 200 mg by mouth every 6 (six) hours as needed for pain.   Yes Historical Provider, MD  traMADol (ULTRAM) 50 MG tablet Take 50 mg by mouth every 6 (six) hours as needed for pain.   Yes Historical Provider, MD    Social History:    reports that she has been smoking Cigarettes.  She has a 17 pack-year smoking history. She has never used smokeless tobacco. She reports that she does not drink alcohol or use illicit drugs.   Family History:    Family History  Problem Relation Age of Onset  . Diabetes type II Other     Mental Status Examination/Evaluation:Psychiatric Specialty Exam: Physical Exam  ROS  Blood pressure 106/69,  pulse 60, temperature 98.2 F (36.8 C), temperature source Oral, resp. rate 16, height 5\' 4"  (1.626 m), weight 96.163 kg (212 lb), last menstrual period 01/11/2013, SpO2 99.00%.Body mass index is 36.37 kg/(m^2).  General Appearance: Well Groomed  Patent attorney::  Fair  Speech:  Clear and Coherent and Normal Rate  Volume:  Normal  Mood:  Depressed, Dysphoric, Hopeless and Worthless  Affect:  Appropriate, Congruent, Depressed and Flat  Thought Process:  Coherent and Intact  Orientation:  Full (Time, Place, and Person)  Thought Content:  NA  Suicidal Thoughts:  No  Homicidal Thoughts:  No  Memory:  Immediate;   Good Recent;   Good Remote;   Good  Judgement:  Poor  Insight:  Shallow  Psychomotor Activity:  Normal  Concentration:  Good  Recall:  NA  Akathisia:  NA  Handed:  Right  AIMS (if indicated):     Assets:  Desire for Improvement Housing  Sleep:          DIAGNOSIS:   AXIS I  Anxiety Disorder NOS, Major Depression, Recurrent severe and Schizoaffective Disorder    AXIS II  Deffered  AXIS III See medical notes.  AXIS IV housing problems, occupational problems, other psychosocial or environmental problems, problems related to social environment and problems with primary support group  AXIS V 41-50 serious symptoms     Assessment/Plan:  Face  to face interview with Dr Lolly Mustache We will increase her ABILIFY to 15 mg po daily We will increase her Zoloft and place her on Scheduled dose of Vistaril daily We will continue to wait for placement at [facility]  I have personally seen the patient and agreed with the findings and involved in the treatment plan. Kathryne Sharper, MD

## 2013-01-29 NOTE — Progress Notes (Signed)
CSW confirmed with Okey Regal that patient remains on [facility] waitlist, at 830 am  .Frutoso Schatz [phone]  ED CSW 01/29/2013 9:09am

## 2013-01-29 NOTE — Progress Notes (Signed)
Per nurse sheriff transport on the way to provide transport to [facility]. CSW informed pt and EDP.   Marland KitchenCatha Gosselin, [place] [phone]  ED CSW .01/29/2013 1311pm

## 2013-01-29 NOTE — Progress Notes (Signed)
Pt accepted to [facility] by Lorine Bears. Pt to be transferred under IVC by sheriff's department. RN to arrange sheriff transportation and inform EDP when closer to transportation time to discharge patient.   Catha Gosselin, LCSW [phone]  ED CSW .01/29/2013 1229pm

## 2013-04-05 ENCOUNTER — Emergency Department (HOSPITAL_COMMUNITY)
Admission: EM | Admit: 2013-04-05 | Discharge: 2013-04-05 | Discharge status: Against Medical Advice | Payer: Medicaid Other | Attending: Emergency Medicine | Admitting: Emergency Medicine

## 2013-04-05 ENCOUNTER — Emergency Department (HOSPITAL_COMMUNITY): Payer: Medicaid Other

## 2013-04-05 ENCOUNTER — Encounter (HOSPITAL_COMMUNITY): Payer: Self-pay | Admitting: Emergency Medicine

## 2013-04-05 DIAGNOSIS — F17200 Nicotine dependence, unspecified, uncomplicated: Secondary | ICD-10-CM | POA: Insufficient documentation

## 2013-04-05 DIAGNOSIS — Z79899 Other long term (current) drug therapy: Secondary | ICD-10-CM | POA: Insufficient documentation

## 2013-04-05 DIAGNOSIS — M25531 Pain in right wrist: Secondary | ICD-10-CM

## 2013-04-05 DIAGNOSIS — S46909A Unspecified injury of unspecified muscle, fascia and tendon at shoulder and upper arm level, unspecified arm, initial encounter: Secondary | ICD-10-CM | POA: Insufficient documentation

## 2013-04-05 DIAGNOSIS — F319 Bipolar disorder, unspecified: Secondary | ICD-10-CM | POA: Insufficient documentation

## 2013-04-05 DIAGNOSIS — Y9389 Activity, other specified: Secondary | ICD-10-CM | POA: Insufficient documentation

## 2013-04-05 DIAGNOSIS — I10 Essential (primary) hypertension: Secondary | ICD-10-CM | POA: Insufficient documentation

## 2013-04-05 DIAGNOSIS — S59909A Unspecified injury of unspecified elbow, initial encounter: Secondary | ICD-10-CM | POA: Insufficient documentation

## 2013-04-05 DIAGNOSIS — M25521 Pain in right elbow: Secondary | ICD-10-CM

## 2013-04-05 DIAGNOSIS — F209 Schizophrenia, unspecified: Secondary | ICD-10-CM | POA: Insufficient documentation

## 2013-04-05 DIAGNOSIS — S4980XA Other specified injuries of shoulder and upper arm, unspecified arm, initial encounter: Secondary | ICD-10-CM | POA: Insufficient documentation

## 2013-04-05 DIAGNOSIS — Y92410 Unspecified street and highway as the place of occurrence of the external cause: Secondary | ICD-10-CM | POA: Insufficient documentation

## 2013-04-05 DIAGNOSIS — S6990XA Unspecified injury of unspecified wrist, hand and finger(s), initial encounter: Secondary | ICD-10-CM | POA: Insufficient documentation

## 2013-04-05 MED ORDER — IBUPROFEN 800 MG PO TABS: 800.0000 mg | ORAL_TABLET | Freq: Three times a day (TID) | ORAL | Status: DC

## 2013-04-05 NOTE — ED Provider Notes (Signed)
CSN: [id number]     Arrival date & time 04/05/13  1619 History  This chart was scribed for non-physician practitioner, Oletha Blend, working with Gavin Pound. Oletta Lamas, MD by Shari Heritage, ED Scribe. This patient was seen in room TR04C/TR04C and the patient's care was started at 6:55 PM.     Chief Complaint  Patient presents with  . Fall    The history is provided by the patient. No language interpreter was used.    HPI Comments: [name] is a 42 y.o. female who presents to the Emergency Department complaining of a fall that occurred 2 weeks ago. Patient states that she was trying to get on a bus when she missed a step and fell forward onto her hands trying to catch herself.  No head trauma or LOC.  She is complaining of mild right wrist, right elbow and left elbow pain. She states that she is also having a shooting pain that travels from her right shoulder down to her hand. There is associated intermittent tingling in her right hand.  No numbness or weakness. She has a medical history of depression, bipolar 1 disorder, schizophrenia and HTN. She is a current every day smoker.    Past Medical History  Diagnosis Date  . Depression   . Bipolar 1 disorder   . Hypertension   . Hemorrhoid   . Transfusion history   . Schizophrenia    Past Surgical History  Procedure Laterality Date  . Cesarean section     Family History  Problem Relation Age of Onset  . Diabetes type II Other    History  Substance Use Topics  . Smoking status: Current Every Day Smoker -- 1.00 packs/day for 17 years    Types: Cigarettes  . Smokeless tobacco: Never Used  . Alcohol Use: No   OB History   Grav Para Term Preterm Abortions TAB SAB Ect Mult Living                 Review of Systems  Musculoskeletal: Positive for arthralgias. Negative for back pain and neck pain.  Neurological: Negative for syncope and headaches.       Positive for tingling in right hand.  All other systems reviewed and are  negative.    Allergies  Flagyl and Sulfa antibiotics  Home Medications   Current Outpatient Rx  Name  Route  Sig  Dispense  Refill  . ARIPiprazole (ABILIFY) 15 MG tablet   Oral   Take 15 mg by mouth daily.          Triage Vitals: BP 131/72  Pulse 63  Temp(Src) 98.2 F (36.8 C) (Oral)  Resp 18  SpO2 100% Physical Exam  Nursing note and vitals reviewed. Constitutional: She is oriented to person, place, and time. She appears well-developed and well-nourished. No distress.  HENT:  Head: Normocephalic and atraumatic.  Eyes: EOM are normal.  Neck: Neck supple. No tracheal deviation present.  Cardiovascular: Normal rate.   Pulmonary/Chest: Effort normal. No respiratory distress.  Musculoskeletal: Normal range of motion.       Right elbow: She exhibits normal range of motion, no swelling, no effusion, no deformity and no laceration. Tenderness found. Medial epicondyle tenderness noted.       Right wrist: She exhibits tenderness and bony tenderness. She exhibits normal range of motion, no swelling, no effusion, no crepitus and no deformity.  TTP of right wrist along radial aspect; no snuffbox tenderness; full ROM maintained with minimal main;  no swelling or deformity noted Right elbow with TTP along medial epicondyle; full ROM maintained without pain Strong radial pulse and cap refill; sensation to light touch intact diffusely throughout RUE Left elbow WNL  Neurological: She is alert and oriented to person, place, and time.  Skin: Skin is warm and dry.  Psychiatric: She has a normal mood and affect. Her behavior is normal.    ED Course  Procedures (including critical care time) DIAGNOSTIC STUDIES: Oxygen Saturation is 100% on room air, normal by my interpretation.    COORDINATION OF CARE: 7:00 PM- Patient presents to ED complaining of bilateral elbow and right wrist pain after a fall that occurred 2 weeks ago. Will order x-rays or right elbow and wrist. Patient informed of  current plan for treatment and evaluation and agrees with plan at this time.    Imaging Review Dg Elbow Complete Right  04/05/2013   CLINICAL DATA:  Fall  EXAM: RIGHT ELBOW - COMPLETE 3+ VIEW  COMPARISON:  None  FINDINGS: Bone mineralization normal.  Joint spaces preserved.  No fracture, dislocation, or bone destruction.  No joint effusion.  IMPRESSION: Normal exam.   Electronically Signed   By: Ulyses Southward M.D.   On: 04/05/2013 19:37   Dg Wrist Complete Right  04/05/2013   CLINICAL DATA:  Right elbow and wrist pain, fell on the bus on 03/22/2013  EXAM: RIGHT WRIST - COMPLETE 3+ VIEW  COMPARISON:  None  FINDINGS: Osseous mineralization normal.  Joint spaces preserved.  No fracture, dislocation, or bone destruction.  IMPRESSION: Normal exam.   Electronically Signed   By: Ulyses Southward M.D.   On: 04/05/2013 19:34    EKG Interpretation   None       MDM   1. Wrist pain, right   2. Elbow pain, right    X-rays negative for acute fracture or dislocation. RUE neurovascularly intact. Patient requests wrist splint and arm sling.  Wrist splint was applied.  Nursing staff went to apply sling but pt was not in room.  Left AMA prior to receiving discharge paperwork or prescriptions.  I personally performed the services described in this documentation, which was scribed in my presence. The recorded information has been reviewed and is accurate.  Garlon Hatchet, PA-C 04/05/13 2029

## 2013-04-05 NOTE — ED Notes (Signed)
Sandwich and drink given.  

## 2013-04-05 NOTE — ED Notes (Signed)
Ortho paged and OTW 

## 2013-04-05 NOTE — ED Notes (Signed)
Pt not answering

## 2013-04-05 NOTE — ED Notes (Signed)
Pt arrived by [facility] for a fall that occurred on 12/4 and pt having bilateral elbow and right hand pain.

## 2013-04-05 NOTE — ED Notes (Signed)
RN went to go apply elbow sling per Pt's request that her elbow felt better immobilized but pt not in room. Things gone. PA aware.

## 2013-04-07 NOTE — ED Provider Notes (Signed)
Medical screening examination/treatment/procedure(s) were performed by non-physician practitioner and as supervising physician I was immediately available for consultation/collaboration.  Gavin Pound. [place] Kretz, MD 04/07/13 1642

## 2013-04-08 ENCOUNTER — Emergency Department (HOSPITAL_COMMUNITY)
Admission: EM | Admit: 2013-04-08 | Discharge: 2013-04-08 | Disposition: A | Discharge status: Home / Self Care | Payer: Medicaid Other | Attending: Emergency Medicine | Admitting: Emergency Medicine

## 2013-04-08 ENCOUNTER — Encounter (HOSPITAL_COMMUNITY): Payer: Self-pay | Admitting: Emergency Medicine

## 2013-04-08 DIAGNOSIS — M25531 Pain in right wrist: Secondary | ICD-10-CM

## 2013-04-08 DIAGNOSIS — IMO0001 Reserved for inherently not codable concepts without codable children: Secondary | ICD-10-CM | POA: Insufficient documentation

## 2013-04-08 DIAGNOSIS — F209 Schizophrenia, unspecified: Secondary | ICD-10-CM | POA: Insufficient documentation

## 2013-04-08 DIAGNOSIS — M545 Low back pain, unspecified: Secondary | ICD-10-CM | POA: Insufficient documentation

## 2013-04-08 DIAGNOSIS — Y92410 Unspecified street and highway as the place of occurrence of the external cause: Secondary | ICD-10-CM | POA: Insufficient documentation

## 2013-04-08 DIAGNOSIS — Y9389 Activity, other specified: Secondary | ICD-10-CM | POA: Insufficient documentation

## 2013-04-08 DIAGNOSIS — M25539 Pain in unspecified wrist: Secondary | ICD-10-CM | POA: Insufficient documentation

## 2013-04-08 DIAGNOSIS — Z881 Allergy status to other antibiotic agents status: Secondary | ICD-10-CM | POA: Insufficient documentation

## 2013-04-08 DIAGNOSIS — Z79899 Other long term (current) drug therapy: Secondary | ICD-10-CM | POA: Insufficient documentation

## 2013-04-08 DIAGNOSIS — Z882 Allergy status to sulfonamides status: Secondary | ICD-10-CM | POA: Insufficient documentation

## 2013-04-08 DIAGNOSIS — M62838 Other muscle spasm: Secondary | ICD-10-CM | POA: Insufficient documentation

## 2013-04-08 DIAGNOSIS — F319 Bipolar disorder, unspecified: Secondary | ICD-10-CM | POA: Insufficient documentation

## 2013-04-08 DIAGNOSIS — F17200 Nicotine dependence, unspecified, uncomplicated: Secondary | ICD-10-CM | POA: Insufficient documentation

## 2013-04-08 DIAGNOSIS — Z9189 Other specified personal risk factors, not elsewhere classified: Secondary | ICD-10-CM | POA: Insufficient documentation

## 2013-04-08 DIAGNOSIS — Z8719 Personal history of other diseases of the digestive system: Secondary | ICD-10-CM | POA: Insufficient documentation

## 2013-04-08 DIAGNOSIS — I10 Essential (primary) hypertension: Secondary | ICD-10-CM | POA: Insufficient documentation

## 2013-04-08 MED ORDER — NAPROXEN 500 MG PO TABS: 500.0000 mg | ORAL_TABLET | Freq: Two times a day (BID) | ORAL | Status: DC

## 2013-04-08 MED ORDER — CYCLOBENZAPRINE HCL 10 MG PO TABS: 10.0000 mg | ORAL_TABLET | Freq: Two times a day (BID) | ORAL | Status: DC | PRN

## 2013-04-08 NOTE — ED Notes (Signed)
Pt reports continued right arm pain and lower back pain. States "last time I was here I left my papers, so I need those." Pt states she took ibuprofen with no relief.
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
## 2013-04-08 NOTE — ED Provider Notes (Signed)
Medical screening examination/treatment/procedure(s) were performed by non-physician practitioner and as supervising physician I was immediately available for consultation/collaboration.  EKG Interpretation   None         Dagmar Hait, MD 04/08/13 1954
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
## 2013-04-23 ENCOUNTER — Encounter (HOSPITAL_COMMUNITY): Payer: Self-pay | Admitting: Emergency Medicine

## 2013-04-23 ENCOUNTER — Emergency Department (HOSPITAL_COMMUNITY)
Admission: EM | Admit: 2013-04-23 | Discharge: 2013-04-24 | Discharge status: Against Medical Advice | Payer: Medicaid Other | Attending: Emergency Medicine | Admitting: Emergency Medicine

## 2013-04-23 DIAGNOSIS — Y92410 Unspecified street and highway as the place of occurrence of the external cause: Secondary | ICD-10-CM | POA: Insufficient documentation

## 2013-04-23 DIAGNOSIS — I10 Essential (primary) hypertension: Secondary | ICD-10-CM | POA: Insufficient documentation

## 2013-04-23 DIAGNOSIS — M79609 Pain in unspecified limb: Secondary | ICD-10-CM | POA: Insufficient documentation

## 2013-04-23 DIAGNOSIS — R109 Unspecified abdominal pain: Secondary | ICD-10-CM | POA: Insufficient documentation

## 2013-04-23 DIAGNOSIS — Z79899 Other long term (current) drug therapy: Secondary | ICD-10-CM | POA: Insufficient documentation

## 2013-04-23 DIAGNOSIS — Z8719 Personal history of other diseases of the digestive system: Secondary | ICD-10-CM | POA: Insufficient documentation

## 2013-04-23 DIAGNOSIS — Z9189 Other specified personal risk factors, not elsewhere classified: Secondary | ICD-10-CM | POA: Insufficient documentation

## 2013-04-23 DIAGNOSIS — F319 Bipolar disorder, unspecified: Secondary | ICD-10-CM | POA: Insufficient documentation

## 2013-04-23 DIAGNOSIS — F209 Schizophrenia, unspecified: Secondary | ICD-10-CM | POA: Insufficient documentation

## 2013-04-23 DIAGNOSIS — Y939 Activity, unspecified: Secondary | ICD-10-CM | POA: Insufficient documentation

## 2013-04-23 DIAGNOSIS — R296 Repeated falls: Secondary | ICD-10-CM | POA: Insufficient documentation

## 2013-04-23 DIAGNOSIS — F17200 Nicotine dependence, unspecified, uncomplicated: Secondary | ICD-10-CM | POA: Insufficient documentation
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
## 2013-05-01 ENCOUNTER — Emergency Department (HOSPITAL_COMMUNITY)
Admission: EM | Admit: 2013-05-01 | Discharge: 2013-05-02 | Disposition: A | Discharge status: Home / Self Care | Payer: Medicaid Other | Attending: Emergency Medicine | Admitting: Emergency Medicine

## 2013-05-01 ENCOUNTER — Encounter (HOSPITAL_COMMUNITY): Payer: Self-pay | Admitting: Emergency Medicine

## 2013-05-01 ENCOUNTER — Emergency Department (HOSPITAL_COMMUNITY): Payer: Medicaid Other

## 2013-05-01 DIAGNOSIS — F209 Schizophrenia, unspecified: Secondary | ICD-10-CM | POA: Insufficient documentation

## 2013-05-01 DIAGNOSIS — F3130 Bipolar disorder, current episode depressed, mild or moderate severity, unspecified: Secondary | ICD-10-CM | POA: Insufficient documentation

## 2013-05-01 DIAGNOSIS — A599 Trichomoniasis, unspecified: Secondary | ICD-10-CM

## 2013-05-01 DIAGNOSIS — I10 Essential (primary) hypertension: Secondary | ICD-10-CM | POA: Insufficient documentation

## 2013-05-01 DIAGNOSIS — Z79899 Other long term (current) drug therapy: Secondary | ICD-10-CM | POA: Insufficient documentation

## 2013-05-01 DIAGNOSIS — Z3202 Encounter for pregnancy test, result negative: Secondary | ICD-10-CM | POA: Insufficient documentation

## 2013-05-01 DIAGNOSIS — F17200 Nicotine dependence, unspecified, uncomplicated: Secondary | ICD-10-CM | POA: Insufficient documentation

## 2013-05-01 DIAGNOSIS — M545 Low back pain, unspecified: Secondary | ICD-10-CM | POA: Insufficient documentation

## 2013-05-01 DIAGNOSIS — G8911 Acute pain due to trauma: Secondary | ICD-10-CM | POA: Insufficient documentation

## 2013-05-01 DIAGNOSIS — Z791 Long term (current) use of non-steroidal anti-inflammatories (NSAID): Secondary | ICD-10-CM | POA: Insufficient documentation

## 2013-05-01 LAB — URINALYSIS, ROUTINE W REFLEX MICROSCOPIC
BILIRUBIN URINE: NEGATIVE
Glucose, UA: NEGATIVE mg/dL
KETONES UR: NEGATIVE mg/dL
Nitrite: NEGATIVE
PH: 6 (ref 5.0–8.0)
Protein, ur: NEGATIVE mg/dL
SPECIFIC GRAVITY, URINE: 1.025 (ref 1.005–1.030)
Urobilinogen, UA: 0.2 mg/dL (ref 0.0–1.0)

## 2013-05-01 LAB — URINE MICROSCOPIC-ADD ON

## 2013-05-01 LAB — POCT PREGNANCY, URINE: Preg Test, Ur: NEGATIVE

## 2013-05-01 MED ORDER — ACETAMINOPHEN 500 MG PO TABS
1000.0000 mg | ORAL_TABLET | Freq: Once | ORAL | Status: AC
  Administered 2013-05-01: 1000 mg via ORAL
  Filled 2013-05-01: qty 2

## 2013-05-01 NOTE — Discharge Instructions (Signed)
SEEK IMMEDIATE MEDICAL ATTENTION IF: New numbness, tingling, weakness, or problem with the use of your arms or legs.  Severe back pain not relieved with medications.  Change in bowel or bladder control.  Increasing pain in any areas of the body (such as chest or abdominal pain).  Shortness of breath, dizziness or fainting.  Nausea (feeling sick to your stomach), vomiting, fever, or sweats.  
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
## 2013-05-02 MED ORDER — METRONIDAZOLE 500 MG PO TABS
2000.0000 mg | ORAL_TABLET | Freq: Once | ORAL | Status: AC
  Administered 2013-05-02: 2000 mg via ORAL
  Filled 2013-05-02: qty 4

## 2013-05-02 MED ORDER — ONDANSETRON 8 MG PO TBDP
8.0000 mg | ORAL_TABLET | Freq: Once | ORAL | Status: AC
  Administered 2013-05-02: 8 mg via ORAL
  Filled 2013-05-02: qty 1

## 2013-05-06 ENCOUNTER — Emergency Department (HOSPITAL_COMMUNITY)
Admission: EM | Admit: 2013-05-06 | Discharge: 2013-05-07 | Disposition: A | Discharge status: Home / Self Care | Payer: Medicaid Other | Attending: Emergency Medicine | Admitting: Emergency Medicine

## 2013-05-06 ENCOUNTER — Encounter (HOSPITAL_COMMUNITY): Payer: Self-pay | Admitting: Emergency Medicine

## 2013-05-06 DIAGNOSIS — R443 Hallucinations, unspecified: Secondary | ICD-10-CM | POA: Insufficient documentation

## 2013-05-06 DIAGNOSIS — M542 Cervicalgia: Secondary | ICD-10-CM | POA: Insufficient documentation

## 2013-05-06 DIAGNOSIS — T50905A Adverse effect of unspecified drugs, medicaments and biological substances, initial encounter: Secondary | ICD-10-CM

## 2013-05-06 DIAGNOSIS — R259 Unspecified abnormal involuntary movements: Secondary | ICD-10-CM | POA: Insufficient documentation

## 2013-05-06 DIAGNOSIS — R440 Auditory hallucinations: Secondary | ICD-10-CM

## 2013-05-06 DIAGNOSIS — F17200 Nicotine dependence, unspecified, uncomplicated: Secondary | ICD-10-CM | POA: Insufficient documentation

## 2013-05-06 DIAGNOSIS — T43505A Adverse effect of unspecified antipsychotics and neuroleptics, initial encounter: Secondary | ICD-10-CM | POA: Insufficient documentation

## 2013-05-06 DIAGNOSIS — I10 Essential (primary) hypertension: Secondary | ICD-10-CM | POA: Insufficient documentation

## 2013-05-06 DIAGNOSIS — Z8719 Personal history of other diseases of the digestive system: Secondary | ICD-10-CM | POA: Insufficient documentation

## 2013-05-06 DIAGNOSIS — Z3202 Encounter for pregnancy test, result negative: Secondary | ICD-10-CM | POA: Insufficient documentation

## 2013-05-06 DIAGNOSIS — F3130 Bipolar disorder, current episode depressed, mild or moderate severity, unspecified: Secondary | ICD-10-CM | POA: Insufficient documentation

## 2013-05-06 DIAGNOSIS — F209 Schizophrenia, unspecified: Secondary | ICD-10-CM | POA: Insufficient documentation

## 2013-05-06 DIAGNOSIS — Z791 Long term (current) use of non-steroidal anti-inflammatories (NSAID): Secondary | ICD-10-CM | POA: Insufficient documentation

## 2013-05-06 LAB — RAPID URINE DRUG SCREEN, HOSP PERFORMED
AMPHETAMINES: NOT DETECTED
Barbiturates: NOT DETECTED
Benzodiazepines: NOT DETECTED
Cocaine: NOT DETECTED
OPIATES: NOT DETECTED
TETRAHYDROCANNABINOL: POSITIVE — AB

## 2013-05-06 LAB — POCT PREGNANCY, URINE: Preg Test, Ur: NEGATIVE

## 2013-05-06 LAB — CBC
HCT: 29 % — ABNORMAL LOW (ref 36.0–46.0)
Hemoglobin: 9.2 g/dL — ABNORMAL LOW (ref 12.0–15.0)
MCH: 26.6 pg (ref 26.0–34.0)
MCHC: 31.7 g/dL (ref 30.0–36.0)
MCV: 83.8 fL (ref 78.0–100.0)
PLATELETS: 280 10*3/uL (ref 150–400)
RBC: 3.46 MIL/uL — ABNORMAL LOW (ref 3.87–5.11)
RDW: 15.8 % — ABNORMAL HIGH (ref 11.5–15.5)
WBC: 9.3 10*3/uL (ref 4.0–10.5)

## 2013-05-06 LAB — SALICYLATE LEVEL: Salicylate Lvl: <2 mg/dL — ABNORMAL LOW (ref 2.8–20.0)

## 2013-05-06 LAB — COMPREHENSIVE METABOLIC PANEL
ALT: 9 U/L (ref 0–35)
AST: 14 U/L (ref 0–37)
Albumin: 3.6 g/dL (ref 3.5–5.2)
Alkaline Phosphatase: 90 U/L (ref 39–117)
BUN: 14 mg/dL (ref 6–23)
CO2: 26 mEq/L (ref 19–32)
Calcium: 9.1 mg/dL (ref 8.4–10.5)
Chloride: 103 mEq/L (ref 96–112)
Creatinine, Ser: 0.83 mg/dL (ref 0.50–1.10)
GFR calc non Af Amer: 85 mL/min — ABNORMAL LOW (ref >90)
Glucose, Bld: 84 mg/dL (ref 70–99)
Potassium: 4.1 mEq/L (ref 3.7–5.3)
SODIUM: 140 meq/L (ref 137–147)
TOTAL PROTEIN: 7.5 g/dL (ref 6.0–8.3)
Total Bilirubin: <0.2 mg/dL — ABNORMAL LOW (ref 0.3–1.2)

## 2013-05-06 LAB — ACETAMINOPHEN LEVEL: Acetaminophen (Tylenol), Serum: <15 ug/mL (ref 10–30)

## 2013-05-06 LAB — ETHANOL: Alcohol, Ethyl (B): <11 mg/dL (ref 0–11)

## 2013-05-06 MED ORDER — ZOLPIDEM TARTRATE 5 MG PO TABS: 5.0000 mg | ORAL_TABLET | Freq: Every evening | ORAL | Status: DC | PRN

## 2013-05-06 MED ORDER — LORAZEPAM 1 MG PO TABS
1.0000 mg | ORAL_TABLET | Freq: Three times a day (TID) | ORAL | Status: DC | PRN
Start: 2013-05-06 — End: 2013-05-07

## 2013-05-06 MED ORDER — ACETAMINOPHEN 325 MG PO TABS
650.0000 mg | ORAL_TABLET | ORAL | Status: DC | PRN
  Administered 2013-05-06: 650 mg via ORAL
  Filled 2013-05-06: qty 2

## 2013-05-06 MED ORDER — ONDANSETRON HCL 4 MG PO TABS: 4.0000 mg | ORAL_TABLET | Freq: Three times a day (TID) | ORAL | Status: DC | PRN

## 2013-05-06 NOTE — ED Notes (Signed)
Patient states that she was at [facility] and was given a shot of "Abilify" and ever  Since a week and a half ago she states that she has been shaking uncontrollably. Currently she states she has pain in her neck that she rates 9 of 10 from the shaking Of her neck.

## 2013-05-06 NOTE — ED Provider Notes (Signed)
CSN: [id number]     Arrival date & time 05/06/13  1924 History   First MD Initiated Contact with Patient 05/06/13 2056     Chief Complaint  Patient presents with  . Medical Clearance   (Consider location/radiation/quality/duration/timing/severity/associated sxs/prior Treatment) Patient is a 43 y.o. female presenting with mental health disorder. The history is provided by the patient. No language interpreter was used.  Mental Health Problem Presenting symptoms: hallucinations   Associated symptoms comment:  She states that she is hearing voices that say she is going to die, but denies any idea of wanting to hurt herself or others. She has a history of the same but feels her symptoms are not controlled as well as they usually are. She also complains of a generalized tremor since receiving a intramuscular injection of Abilify 10 days ago. She has soreness in her neck, she feels, from the constant tremor causing her head to shake.    Past Medical History  Diagnosis Date  . Depression   . Bipolar 1 disorder   . Hypertension   . Hemorrhoid   . Transfusion history   . Schizophrenia    Past Surgical History  Procedure Laterality Date  . Cesarean section     Family History  Problem Relation Age of Onset  . Diabetes type II Other    History  Substance Use Topics  . Smoking status: Current Every Day Smoker -- 1.00 packs/day for 17 years    Types: Cigarettes  . Smokeless tobacco: Never Used  . Alcohol Use: No   OB History   Grav Para Term Preterm Abortions TAB SAB Ect Mult Living                 Review of Systems  Constitutional: Negative for fever and chills.  HENT: Negative.   Respiratory: Negative.   Cardiovascular: Negative.   Gastrointestinal: Negative.   Musculoskeletal: Positive for neck pain.       See HPI.  Skin: Negative.   Neurological: Positive for tremors.  Psychiatric/Behavioral: Positive for hallucinations.    Allergies  Flagyl and Sulfa antibiotics  Home  Medications   Current Outpatient Rx  Name  Route  Sig  Dispense  Refill  . acetaminophen (TYLENOL) 500 MG tablet   Oral   Take 1,000 mg by mouth every 6 (six) hours as needed for mild pain.         . ARIPiprazole (ABILIFY) 9.75 MG/1.3ML injection   Intramuscular   Inject 9.75 mg into the muscle every 30 (thirty) days.         . cyclobenzaprine (FLEXERIL) 10 MG tablet   Oral   Take 1 tablet (10 mg total) by mouth 2 (two) times daily as needed for muscle spasms.   20 tablet   0   . naproxen (NAPROSYN) 500 MG tablet   Oral   Take 1 tablet (500 mg total) by mouth 2 (two) times daily with a meal.   20 tablet   0    BP 124/61  Pulse 80  Temp(Src) 98.4 F (36.9 C) (Oral)  Resp 20  Ht 5\' 4"  (1.626 m)  Wt 242 lb (109.77 kg)  BMI 41.52 kg/m2  SpO2 95%  LMP 04/29/2013 Physical Exam  Constitutional: She is oriented to person, place, and time. She appears well-developed and well-nourished.  HENT:  Head: Normocephalic.  Neck: Normal range of motion. Neck supple.  Cardiovascular: Normal rate and regular rhythm.   Pulmonary/Chest: Effort normal and breath sounds normal.  Abdominal: Soft.  Bowel sounds are normal. There is no tenderness. There is no rebound and no guarding.  Musculoskeletal: Normal range of motion.  Neurological: She is alert and oriented to person, place, and time.  Gross motor tremor that is persistent in neck and with use in arms and legs. Otherwise normal coordination.   Skin: Skin is warm and dry. No rash noted.  Psychiatric: She has a normal mood and affect.    ED Course  Procedures (including critical care time) Labs Review Labs Reviewed  ACETAMINOPHEN LEVEL  CBC  COMPREHENSIVE METABOLIC PANEL  ETHANOL  SALICYLATE LEVEL  URINE RAPID DRUG SCREEN (HOSP PERFORMED)   Results for orders placed during the hospital encounter of 05/06/13  ACETAMINOPHEN LEVEL      Result Value Range   Acetaminophen (Tylenol), Serum <15.0  10 - 30 ug/mL  CBC       Result Value Range   WBC 9.3  4.0 - 10.5 K/uL   RBC 3.46 (*) 3.87 - 5.11 MIL/uL   Hemoglobin 9.2 (*) 12.0 - 15.0 g/dL   HCT 29.0 (*) 36.0 - 46.0 %   MCV 83.8  78.0 - 100.0 fL   MCH 26.6  26.0 - 34.0 pg   MCHC 31.7  30.0 - 36.0 g/dL   RDW 15.8 (*) 11.5 - 15.5 %   Platelets 280  150 - 400 K/uL  COMPREHENSIVE METABOLIC PANEL      Result Value Range   Sodium 140  137 - 147 mEq/L   Potassium 4.1  3.7 - 5.3 mEq/L   Chloride 103  96 - 112 mEq/L   CO2 26  19 - 32 mEq/L   Glucose, Bld 84  70 - 99 mg/dL   BUN 14  6 - 23 mg/dL   Creatinine, Ser 0.83  0.50 - 1.10 mg/dL   Calcium 9.1  8.4 - 10.5 mg/dL   Total Protein 7.5  6.0 - 8.3 g/dL   Albumin 3.6  3.5 - 5.2 g/dL   AST 14  0 - 37 U/L   ALT 9  0 - 35 U/L   Alkaline Phosphatase 90  39 - 117 U/L   Total Bilirubin <0.2 (*) 0.3 - 1.2 mg/dL   GFR calc non Af Amer 85 (*) >90 mL/min   GFR calc Af Amer >90  >90 mL/min  ETHANOL      Result Value Range   Alcohol, Ethyl (B) <11  0 - 11 mg/dL  SALICYLATE LEVEL      Result Value Range   Salicylate Lvl <9.2 (*) 2.8 - 20.0 mg/dL  URINE RAPID DRUG SCREEN (HOSP PERFORMED)      Result Value Range   Opiates NONE DETECTED  NONE DETECTED   Cocaine NONE DETECTED  NONE DETECTED   Benzodiazepines NONE DETECTED  NONE DETECTED   Amphetamines NONE DETECTED  NONE DETECTED   Tetrahydrocannabinol POSITIVE (*) NONE DETECTED   Barbiturates NONE DETECTED  NONE DETECTED  POCT PREGNANCY, URINE      Result Value Range   Preg Test, Ur NEGATIVE  NEGATIVE    Imaging Review No results found.  EKG Interpretation   None       MDM  No diagnosis found. 1. Auditory hallucinations 2. Adverse medication reaction  Tremor is c/w extrapyramidal movements common with Abilify. Should be discussed on psychiatric evaluation. She is currently hallucinating, voices causing acute disturbance. Will order psych evaluation to determine disposition.  12:00 a.m: re-evaluation: She reports her main concern is the tremor and  neck pain.  She feels auditory hallucinations are exacerbated by physical and emotional strain and does not feel she is a danger to herself or others. Seen by [facility] Coralyn Mark) and agrees. Discussed pain medication/muscle relaxer and further discussion with her outpatient psychiatrist as to Abilify continuation.     Dewaine Oats, PA-C 05/06/13 2359  Dewaine Oats, PA-C 05/07/13 0050

## 2013-05-07 MED ORDER — HYDROCODONE-ACETAMINOPHEN 5-325 MG PO TABS
1.0000 | ORAL_TABLET | Freq: Once | ORAL | Status: AC
  Administered 2013-05-07: 1 via ORAL
  Filled 2013-05-07: qty 1

## 2013-05-07 MED ORDER — HYDROCODONE-ACETAMINOPHEN 5-325 MG PO TABS: 1.0000 | ORAL_TABLET | Freq: Four times a day (QID) | ORAL | Status: DC | PRN

## 2013-05-07 MED ORDER — CYCLOBENZAPRINE HCL 10 MG PO TABS: 10.0000 mg | ORAL_TABLET | Freq: Three times a day (TID) | ORAL | Status: DC | PRN

## 2013-05-07 MED ORDER — CYCLOBENZAPRINE HCL 10 MG PO TABS
10.0000 mg | ORAL_TABLET | Freq: Once | ORAL | Status: AC
  Administered 2013-05-07: 10 mg via ORAL
  Filled 2013-05-07: qty 1

## 2013-05-07 NOTE — Discharge Instructions (Signed)
Tremor  Tremor is a rhythmic, involuntary muscular contraction characterized by oscillations (to-and-fro movements) of a part of the body. The most common of all involuntary movements, tremor can affect various body parts such as the hands, head, facial structures, vocal cords, trunk, and legs; most tremors, however, occur in the hands. Tremor often accompanies neurological disorders associated with aging. Although the disorder is not life-threatening, it can be responsible for functional disability and social embarrassment.  TREATMENT   There are many types of tremor and several ways in which tremor is classified. The most common classification is by behavioral context or position. There are five categories of tremor within this classification: resting, postural, kinetic, task-specific, and psychogenic. Resting or static tremor occurs when the muscle is at rest, for example when the hands are lying on the lap. This type of tremor is often seen in patients with Parkinson's disease. Postural tremor occurs when a patient attempts to maintain posture, such as holding the hands outstretched. Postural tremors include physiological tremor, essential tremor, tremor with basal ganglia disease (also seen in patients with Parkinson's disease), cerebellar postural tremor, tremor with peripheral neuropathy, post-traumatic tremor, and alcoholic tremor. Kinetic or intention (action) tremor occurs during purposeful movement, for example during finger-to-nose testing. Task-specific tremor appears when performing goal-oriented tasks such as handwriting, speaking, or standing. This group consists of primary writing tremor, vocal tremor, and orthostatic tremor. Psychogenic tremor occurs in both older and younger patients. The key feature of this tremor is that it dramatically lessens or disappears when the patient is distracted.  PROGNOSIS  There are some treatment options available for tremor; the appropriate treatment depends on  accurate diagnosis of the cause. Some tremors respond to treatment of the underlying condition, for example in some cases of psychogenic tremor treating the patient's underlying mental problem may cause the tremor to disappear. Also, patients with tremor due to Parkinson's disease may be treated with Levodopa drug therapy. Symptomatic drug therapy is available for several other tremors as well. For those cases of tremor in which there is no effective drug treatment, physical measures such as teaching the patient to brace the affected limb during the tremor are sometimes useful. Surgical intervention such as thalamotomy or deep brain stimulation may be useful in certain cases.  Document Released: 03/26/2002 Document Revised: 06/28/2011 Document Reviewed: 04/05/2005  Exit[employer]® Patient Information ©2014 [employer].
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
## 2013-05-09 NOTE — ED Provider Notes (Signed)
Medical screening examination/treatment/procedure(s) were performed by non-physician practitioner and as supervising physician I was immediately available for consultation/collaboration.   Yasira Engelson L Hilde Churchman, MD 05/09/13 2134 

## 2013-07-28 ENCOUNTER — Encounter (HOSPITAL_COMMUNITY): Payer: Self-pay | Admitting: Emergency Medicine

## 2013-07-28 ENCOUNTER — Emergency Department (HOSPITAL_COMMUNITY)
Admission: EM | Admit: 2013-07-28 | Discharge: 2013-07-29 | Disposition: A | Discharge status: Home / Self Care | Payer: Medicaid Other | Attending: Emergency Medicine | Admitting: Emergency Medicine

## 2013-07-28 DIAGNOSIS — F17200 Nicotine dependence, unspecified, uncomplicated: Secondary | ICD-10-CM | POA: Diagnosis not present

## 2013-07-28 DIAGNOSIS — Z79899 Other long term (current) drug therapy: Secondary | ICD-10-CM | POA: Insufficient documentation

## 2013-07-28 DIAGNOSIS — Z87828 Personal history of other (healed) physical injury and trauma: Secondary | ICD-10-CM | POA: Diagnosis not present

## 2013-07-28 DIAGNOSIS — M545 Low back pain, unspecified: Secondary | ICD-10-CM | POA: Diagnosis not present

## 2013-07-28 DIAGNOSIS — R059 Cough, unspecified: Secondary | ICD-10-CM | POA: Insufficient documentation

## 2013-07-28 DIAGNOSIS — I10 Essential (primary) hypertension: Secondary | ICD-10-CM | POA: Insufficient documentation

## 2013-07-28 DIAGNOSIS — Z791 Long term (current) use of non-steroidal anti-inflammatories (NSAID): Secondary | ICD-10-CM | POA: Diagnosis not present

## 2013-07-28 DIAGNOSIS — F209 Schizophrenia, unspecified: Secondary | ICD-10-CM | POA: Diagnosis not present

## 2013-07-28 DIAGNOSIS — R05 Cough: Secondary | ICD-10-CM

## 2013-07-28 DIAGNOSIS — J3489 Other specified disorders of nose and nasal sinuses: Secondary | ICD-10-CM | POA: Diagnosis not present

## 2013-07-28 DIAGNOSIS — G8929 Other chronic pain: Secondary | ICD-10-CM | POA: Diagnosis not present

## 2013-07-28 DIAGNOSIS — Z3202 Encounter for pregnancy test, result negative: Secondary | ICD-10-CM | POA: Insufficient documentation

## 2013-07-28 DIAGNOSIS — F319 Bipolar disorder, unspecified: Secondary | ICD-10-CM | POA: Insufficient documentation

## 2013-07-28 NOTE — ED Notes (Signed)
Per EMS: On March 20, 2013 pt was getting onto bus and fell, hurting her lower back. X-ray negative, pt told to follow up with specialist and/or physical therapy. Pt has not followed up. Pt also c/o productive cough x 3 days. Lung sounds clear. No fever.

## 2013-07-28 NOTE — ED Notes (Signed)
Bed: WA07 Expected date: 07/28/13 Expected time: 10:57 PM Means of arrival: Ambulance Comments: Back pain/cough

## 2013-07-28 NOTE — ED Provider Notes (Signed)
CSN: [id number]     Arrival date & time 07/28/13  2310 History   First MD Initiated Contact with Patient 07/28/13 2338     Chief Complaint  Patient presents with  . Back Pain  . Cough   HPI  [name] is a 43 y.o. female with a PMH of bipolar disorder, HTN, depression, and schizophrenia who presents to the ED for evaluation of back pain and cough. History was provided by the patient. Patient states that on 03/19/2013 she was getting onto a bus and fell on her lower back. Patient states that she has had lower back pain since that time. Has had negative lumbar x-rays in the past and was informed to follow-up with a primary provider however patient has not done this. Patient states that her pain is a constant sharp pain located in her lower middle back pain with radiation down her left leg. Pain is worse with movement. Patient took an ibuprofen which did not improve her pain. Pain today is similar to her chronic lower back pain with no acute changes. Patient denies any abdominal pain, dysuria, vaginal discharge/bleeding, nausea, vomiting, weakness, loss of bowel or bladder function, numbness, tingling, or loss of sensation. No hx of cancer or IV drug use. No weight loss or night sweats. No new injuries or trauma. Patient also complains of a three-day history of cough with yellow productive sputum. No hemoptysis. Patient is a current tobacco user. Patient denies history of asthma however states has had to use an inhaler in the past. Patient denies any shortness of breath, wheezing, or chest pain. Has had mild chest tightness and congestion. She also has had a sneezing, rhinorrhea, nasal congestion, and sinus pressure. States that she tried an over-the-counter sinus medication with no improvement in her symptoms. Denies any sick contacts. Denies any fever, chills, change in appetite or activity, or headache. No history of DVT, PE, clotting disorder, cancer, recent surgery, immobilization, travel, or  estrogen supplementation.    Past Medical History  Diagnosis Date  . Depression   . Bipolar 1 disorder   . Hypertension   . Hemorrhoid   . Transfusion history   . Schizophrenia    Past Surgical History  Procedure Laterality Date  . Cesarean section     Family History  Problem Relation Age of Onset  . Diabetes type II Other    History  Substance Use Topics  . Smoking status: Current Every Day Smoker -- 1.00 packs/day for 17 years    Types: Cigarettes  . Smokeless tobacco: Never Used  . Alcohol Use: No   OB History   Grav Para Term Preterm Abortions TAB SAB Ect Mult Living                  Review of Systems  Constitutional: Negative for fever, chills, diaphoresis, activity change, appetite change and fatigue.  HENT: Positive for congestion, rhinorrhea, sinus pressure and sneezing. Negative for ear pain and sore throat.   Respiratory: Positive for cough and chest tightness. Negative for shortness of breath and wheezing.   Cardiovascular: Negative for chest pain and leg swelling.  Gastrointestinal: Negative for nausea, vomiting, abdominal pain, diarrhea and constipation.  Genitourinary: Negative for dysuria, vaginal bleeding, vaginal discharge, difficulty urinating, vaginal pain and pelvic pain.  Musculoskeletal: Positive for back pain. Negative for gait problem and myalgias.  Skin: Negative for rash.  Neurological: Negative for dizziness, weakness, light-headedness, numbness and headaches.    Allergies  Flagyl and Sulfa antibiotics  Home  Medications   Current Outpatient Rx  Name  Route  Sig  Dispense  Refill  . acetaminophen (TYLENOL) 500 MG tablet   Oral   Take 1,000 mg by mouth every 6 (six) hours as needed for mild pain.         . ARIPiprazole (ABILIFY) 9.75 MG/1.3ML injection   Intramuscular   Inject 9.75 mg into the muscle every 30 (thirty) days.         . cyclobenzaprine (FLEXERIL) 10 MG tablet   Oral   Take 1 tablet (10 mg total) by mouth 2 (two)  times daily as needed for muscle spasms.   20 tablet   0   . cyclobenzaprine (FLEXERIL) 10 MG tablet   Oral   Take 1 tablet (10 mg total) by mouth 3 (three) times daily as needed for muscle spasms.   8 tablet   0   . HYDROcodone-acetaminophen (NORCO) 5-325 MG per tablet   Oral   Take 1 tablet by mouth every 6 (six) hours as needed for moderate pain.   8 tablet   0   . naproxen (NAPROSYN) 500 MG tablet   Oral   Take 1 tablet (500 mg total) by mouth 2 (two) times daily with a meal.   20 tablet   0    BP 129/58  Pulse 78  Temp(Src) 98.7 F (37.1 C) (Oral)  Resp 16  Ht 5\' 4"  (1.626 m)  SpO2 98%  LMP 07/23/2013  Filed Vitals:   07/28/13 2311 07/28/13 2312 07/29/13 0202  BP: 129/58  134/55  Pulse: 78  74  Temp: 98.7 F (37.1 C)    TempSrc: Oral    Resp: 16  17  Height: 5\' 4"  (1.626 m)    SpO2: 99% 98% 99%    Physical Exam  Nursing note and vitals reviewed. Constitutional: She is oriented to person, place, and time. She appears well-developed and well-nourished. No distress.  Non-toxic  HENT:  Head: Normocephalic and atraumatic.  Right Ear: External ear normal.  Left Ear: External ear normal.  Nose: Nose normal.  Mouth/Throat: Oropharynx is clear and moist. No oropharyngeal exudate.  Tympanic membranes gray and translucent bilaterally with no erythema, edema, or hemotympanum.  No mastoid or tragal tenderness bilaterally. No erythema to the posterior pharynx. Tonsils without edema or exudates. Uvula midline. No trismus. No difficulty controlling secretions.   Eyes: Conjunctivae are normal. Right eye exhibits no discharge. Left eye exhibits no discharge.  Neck: Normal range of motion. Neck supple.  Cardiovascular: Normal rate, regular rhythm, normal heart sounds and intact distal pulses.  Exam reveals no gallop and no friction rub.   No murmur heard. Dorsalis pedis pulses present and equal bilaterally  Pulmonary/Chest: Effort normal. No respiratory distress. She has  wheezes. She has no rales. She exhibits no tenderness.  Mild intermittent wheeze throughout  Abdominal: Soft. She exhibits no distension and no mass. There is no tenderness. There is no rebound and no guarding.  Musculoskeletal: Normal range of motion. She exhibits tenderness. She exhibits no edema.       Back:  Tenderness to palpation to the lower middle spine and paraspinal muscles throughout. Positive straight leg raise on the left. Pain worse with sitting up and movement. Strength 5/5 in the upper and lower extremities bilaterally. Patient able to ambulate without difficulty or ataxia.   Neurological: She is alert and oriented to person, place, and time.  Patellar reflexes intact bilaterally. Gross sensation intact in the lower extremities bilaterally  Skin:  Skin is warm and dry. She is not diaphoretic.     ED Course  Procedures (including critical care time) Labs Review Labs Reviewed - No data to display Imaging Review No results found.   EKG Interpretation None      Results for orders placed during the hospital encounter of 07/28/13  PREGNANCY, URINE      Result Value Ref Range   Preg Test, Ur NEGATIVE  NEGATIVE  URINALYSIS, ROUTINE W REFLEX MICROSCOPIC      Result Value Ref Range   Color, Urine YELLOW  YELLOW   APPearance CLEAR  CLEAR   Specific Gravity, Urine 1.020  1.005 - 1.030   pH 5.5  5.0 - 8.0   Glucose, UA NEGATIVE  NEGATIVE mg/dL   Hgb urine dipstick NEGATIVE  NEGATIVE   Bilirubin Urine NEGATIVE  NEGATIVE   Ketones, ur NEGATIVE  NEGATIVE mg/dL   Protein, ur NEGATIVE  NEGATIVE mg/dL   Urobilinogen, UA 0.2  0.0 - 1.0 mg/dL   Nitrite NEGATIVE  NEGATIVE   Leukocytes, UA NEGATIVE  NEGATIVE     MDM   [name] is a 43 y.o. female with a PMH of bipolar disorder, HTN, depression, and schizophrenia who presents to the ED for evaluation of back pain and cough  Rechecks  1:45 AM = Chest tightness resolved. Lungs clear to auscultation. Improved  aeration. No chest pain or SOB. Ordering albuterol inhaler. Back pain improved but ongoing. Ordering Vicodin.   2:00 AM = Back pain continues to improved. Ready for discharge.     Patient evaluated in emergency department for 2 complaints. Patient complains of lower constant back pain since December after a fall which is unchanged. Her lumbar x-rays were reviewed which shows osteoarthritis. No new injuries or trauma. UA negative for UTI. No warning signs or symptoms of back pain including loss of bowel or bladder control, night sweats, waking from sleep with back pain, unexplained fevers or weight loss, history of cancer, or IV drug use. No concern for cauda equina, epidural abscess, or other serious/life threatening cause of back pain. Patient had improvement in her back pain throughout her ED visit with Toradol, Decadron, and Vicodin. Instructed patient to followup with primary care provider or further evaluation and management. Patient also complained of a cough with URI like symptoms for the past 3 days. Chest x-ray negative for an acute cardiopulmonary process. Patient had mild chest tightness which resolved after an albuterol treatment in the emergency department. Denied chest pain or shortness of breath. Symptoms possibly due to URI vs bronchitis vs viral syndrome. Patient is PERC negative. Doubt PE. No hypoxia, respiratory distress, or tachypnea. Patient afebrile and nontoxic in appearance. Given albuterol inhaler for OP management. Encouraged patient to stop smoking and followup with primary care provider. Return precautions, discharge instructions, and follow-up was discussed with the patient before discharge.      Discharge Medication List as of 07/29/2013  1:57 AM    START taking these medications   Details  cyclobenzaprine (FLEXERIL) 5 MG tablet Take 1 tablet (5 mg total) by mouth 3 (three) times daily as needed for muscle spasms., Starting 07/29/2013, Until Discontinued, Print    naproxen  (NAPROSYN) 500 MG tablet Take 1 tablet (500 mg total) by mouth 2 (two) times daily with a meal., Starting 07/29/2013, Until Discontinued, Print         Final impressions: 1. Cough   2. Lower back pain       Mercy Moore PA-C  Lucila Maine, PA-C 07/30/13 [phone]

## 2013-07-29 ENCOUNTER — Emergency Department (HOSPITAL_COMMUNITY): Payer: Medicaid Other

## 2013-07-29 LAB — PREGNANCY, URINE: PREG TEST UR: NEGATIVE

## 2013-07-29 LAB — URINALYSIS, ROUTINE W REFLEX MICROSCOPIC
Bilirubin Urine: NEGATIVE
GLUCOSE, UA: NEGATIVE mg/dL
Hgb urine dipstick: NEGATIVE
KETONES UR: NEGATIVE mg/dL
LEUKOCYTES UA: NEGATIVE
Nitrite: NEGATIVE
PROTEIN: NEGATIVE mg/dL
Specific Gravity, Urine: 1.02 (ref 1.005–1.030)
UROBILINOGEN UA: 0.2 mg/dL (ref 0.0–1.0)
pH: 5.5 (ref 5.0–8.0)

## 2013-07-29 MED ORDER — ALBUTEROL SULFATE (2.5 MG/3ML) 0.083% IN NEBU
5.0000 mg | INHALATION_SOLUTION | Freq: Once | RESPIRATORY_TRACT | Status: AC
  Administered 2013-07-29: 5 mg via RESPIRATORY_TRACT
  Filled 2013-07-29: qty 6

## 2013-07-29 MED ORDER — ALBUTEROL SULFATE HFA 108 (90 BASE) MCG/ACT IN AERS
2.0000 | INHALATION_SPRAY | Freq: Once | RESPIRATORY_TRACT | Status: AC
  Administered 2013-07-29: 2 via RESPIRATORY_TRACT
  Filled 2013-07-29: qty 6.7

## 2013-07-29 MED ORDER — DEXAMETHASONE SODIUM PHOSPHATE 10 MG/ML IJ SOLN
10.0000 mg | Freq: Once | INTRAMUSCULAR | Status: AC
  Administered 2013-07-29: 10 mg via INTRAMUSCULAR
  Filled 2013-07-29: qty 1

## 2013-07-29 MED ORDER — HYDROCODONE-ACETAMINOPHEN 5-325 MG PO TABS
2.0000 | ORAL_TABLET | Freq: Once | ORAL | Status: AC
  Administered 2013-07-29: 2 via ORAL
  Filled 2013-07-29: qty 2

## 2013-07-29 MED ORDER — CYCLOBENZAPRINE HCL 5 MG PO TABS: 5.0000 mg | ORAL_TABLET | Freq: Three times a day (TID) | ORAL | Status: DC | PRN

## 2013-07-29 MED ORDER — NAPROXEN 500 MG PO TABS: 500.0000 mg | ORAL_TABLET | Freq: Two times a day (BID) | ORAL | Status: DC

## 2013-07-29 MED ORDER — KETOROLAC TROMETHAMINE 30 MG/ML IJ SOLN
30.0000 mg | Freq: Once | INTRAMUSCULAR | Status: AC
  Administered 2013-07-29: 30 mg via INTRAMUSCULAR
  Filled 2013-07-29: qty 1

## 2013-07-29 NOTE — ED Notes (Signed)
Informed pt that we do have an order for narcotic pain medication however she needs to secure a ride home before it can be administered.  Pt is making phone calls at this time will check back to see if pt has secured a ride home.

## 2013-07-29 NOTE — ED Notes (Signed)
Pt out of room for x-ray 

## 2013-07-29 NOTE — Discharge Instructions (Signed)
2 puffs every 4-6 hours Drink fluids and rest  Flexeril for muscle spasm - Please be careful with this medication.  It can cause drowsiness.  Use caution while driving, operating machinery, drinking alcohol, or any other activities that may impair your physical or mental abilities.   Naprosyn for back pain - take with food  Return to the emergency department if you develop any changing/worsening condition, difficulty breathing, fever, chest pain, coughing up blood, loss of bowel/bladder function, weakness, or any other concerns (please read additional information regarding your condition below)    Back Pain, Adult Low back pain is very common. About 1 in 5 people have back pain.The cause of low back pain is rarely dangerous. The pain often gets better over time.About half of people with a sudden onset of back pain feel better in just 2 weeks. About 8 in 10 people feel better by 6 weeks.  CAUSES Some common causes of back pain include:  Strain of the muscles or ligaments supporting the spine.  Wear and tear (degeneration) of the spinal discs.  Arthritis.  Direct injury to the back. DIAGNOSIS Most of the time, the direct cause of low back pain is not known.However, back pain can be treated effectively even when the exact cause of the pain is unknown.Answering your caregiver's questions about your overall health and symptoms is one of the most accurate ways to make sure the cause of your pain is not dangerous. If your caregiver needs more information, he or she may order lab work or imaging tests (X-rays or MRIs).However, even if imaging tests show changes in your back, this usually does not require surgery. HOME CARE INSTRUCTIONS For many people, back pain returns.Since low back pain is rarely dangerous, it is often a condition that people can learn to [facility] their own.   Remain active. It is stressful on the back to sit or stand in one place. Do not sit, drive, or stand in one place  for more than 30 minutes at a time. Take short walks on level surfaces as soon as pain allows.Try to increase the length of time you walk each day.  Do not stay in bed.Resting more than 1 or 2 days can delay your recovery.  Do not avoid exercise or work.Your body is made to move.It is not dangerous to be active, even though your back may hurt.Your back will likely heal faster if you return to being active before your pain is gone.  Pay attention to your body when you bend and lift. Many people have less discomfortwhen lifting if they bend their knees, keep the load close to their bodies,and avoid twisting. Often, the most comfortable positions are those that put less stress on your recovering back.  Find a comfortable position to sleep. Use a firm mattress and lie on your side with your knees slightly bent. If you lie on your back, put a pillow under your knees.  Only take over-the-counter or prescription medicines as directed by your caregiver. Over-the-counter medicines to reduce pain and inflammation are often the most helpful.Your caregiver may prescribe muscle relaxant drugs.These medicines help dull your pain so you can more quickly return to your normal activities and healthy exercise.  Put ice on the injured area.  Put ice in a plastic bag.  Place a towel between your skin and the bag.  Leave the ice on for 15-20 minutes, 03-04 times a day for the first 2 to 3 days. After that, ice and heat may be  alternated to reduce pain and spasms.  Ask your caregiver about trying back exercises and gentle massage. This may be of some benefit.  Avoid feeling anxious or stressed.Stress increases muscle tension and can worsen back pain.It is important to recognize when you are anxious or stressed and learn ways to manage it.Exercise is a great option. SEEK MEDICAL CARE IF:  You have pain that is not relieved with rest or medicine.  You have pain that does not improve in 1 week.  You  have new symptoms.  You are generally not feeling well. SEEK IMMEDIATE MEDICAL CARE IF:   You have pain that radiates from your back into your legs.  You develop new bowel or bladder control problems.  You have unusual weakness or numbness in your arms or legs.  You develop nausea or vomiting.  You develop abdominal pain.  You feel faint. Document Released: 04/05/2005 Document Revised: 10/05/2011 Document Reviewed: 08/24/2010 [facility] Patient Information 2014 [place], [place].  Cough, Adult  A cough is a reflex that helps clear your throat and airways. It can help heal the body or may be a reaction to an irritated airway. A cough may only last 2 or 3 weeks (acute) or may last more than 8 weeks (chronic).  CAUSES Acute cough: Viral or bacterial infections. Chronic cough: Infections. Allergies. Asthma. Post-nasal drip. Smoking. Heartburn or acid reflux. Some medicines. Chronic lung problems (COPD). Cancer. SYMPTOMS  Cough. Fever. Chest pain. Increased breathing rate. High-pitched whistling sound when breathing (wheezing). Colored mucus that you cough up (sputum). TREATMENT  A bacterial cough may be treated with antibiotic medicine. A viral cough must run its course and will not respond to antibiotics. Your caregiver may recommend other treatments if you have a chronic cough. HOME CARE INSTRUCTIONS  Only take over-the-counter or prescription medicines for pain, discomfort, or fever as directed by your caregiver. Use cough suppressants only as directed by your caregiver. Use a cold steam vaporizer or humidifier in your bedroom or home to help loosen secretions. Sleep in a semi-upright position if your cough is worse at night. Rest as needed. Stop smoking if you smoke. SEEK IMMEDIATE MEDICAL CARE IF:  You have pus in your sputum. Your cough starts to worsen. You cannot control your cough with suppressants and are losing sleep. You begin coughing up blood. You have  difficulty breathing. You develop pain which is getting worse or is uncontrolled with medicine. You have a fever. MAKE SURE YOU:  Understand these instructions. Will watch your condition. Will get help right away if you are not doing well or get worse. Document Released: 10/02/2010 Document Revised: 06/28/2011 Document Reviewed: 10/02/2010 [facility] Patient Information 2014 [place].   Upper Respiratory Infection, Adult An upper respiratory infection (URI) is also known as the common cold. It is often caused by a type of germ (virus). Colds are easily spread (contagious). You can pass it to others by kissing, coughing, sneezing, or drinking out of the same glass. Usually, you get better in 1 or 2 weeks.  HOME CARE   Only take medicine as told by your doctor.  Use a warm mist humidifier or breathe in steam from a hot shower.  Drink enough water and fluids to keep your pee (urine) clear or pale yellow.  Get plenty of rest.  Return to work when your temperature is back to normal or as told by your doctor. You may use a face mask and wash your hands to stop your cold from spreading. GET HELP  RIGHT AWAY IF:   After the first few days, you feel you are getting worse.  You have questions about your medicine.  You have chills, shortness of breath, or brown or red spit (mucus).  You have yellow or brown snot (nasal discharge) or pain in the face, especially when you bend forward.  You have a fever, puffy (swollen) neck, pain when you swallow, or white spots in the back of your throat.  You have a bad headache, ear pain, sinus pain, or chest pain.  You have a high-pitched whistling sound when you breathe in and out (wheezing).  You have a lasting cough or cough up blood.  You have sore muscles or a stiff neck. MAKE SURE YOU:   Understand these instructions.  Will watch your condition.  Will get help right away if you are not doing well or get worse. Document Released:  09/22/2007 Document Revised: 06/28/2011 Document Reviewed: 08/10/2010 [facility] Patient Information 2014 [place], [place].   Emergency Department Resource Guide 1) Find a Doctor and Pay Out of Pocket Although you won't have to find out who is covered by your insurance plan, it is a good idea to ask around and get recommendations. You will then need to call the office and see if the doctor you have chosen will accept you as a new patient and what types of options they offer for patients who are self-pay. Some doctors offer discounts or will set up payment plans for their patients who do not have insurance, but you will need to ask so you aren't surprised when you get to your appointment.  2) Contact Your Local Health Department Not all health departments have doctors that can see patients for sick visits, but many do, so it is worth a call to see if yours does. If you don't know where your local health department is, you can check in your phone book. The CDC also has a tool to help you locate your state's health department, and many state websites also have listings of all of their local health departments.  3) Find a [facility] If your illness is not likely to be very severe or complicated, you may want to try a walk in clinic. These are popping up all over the country in pharmacies, drugstores, and shopping centers. They're usually staffed by nurse practitioners or physician assistants that have been trained to treat common illnesses and complaints. They're usually fairly quick and inexpensive. However, if you have serious medical issues or chronic medical problems, these are probably not your best option.  No Primary Care Doctor: - Call Health Connect at  [phone] - they can help you locate a primary care doctor that  accepts your insurance, provides certain services, etc. - Physician Referral Service- [phone]  Chronic Pain Problems: Organization         Address  Phone   Notes  [facility]  [phone] Patients need to be referred by their primary care doctor.   Medication Assistance: Organization         Address  Phone   Notes  [facility] Medication [facility] [place] [place], [place] [address] [phone] --Must be a resident of [facility] -- Must have NO insurance coverage whatsoever (no Medicaid/ Medicare, etc.) -- The pt. MUST have a primary care doctor that directs their care regularly and follows them in the community   Med[facility]ist  [phone]   [address]  [phone]  Agencies that provide inexpensive medical care: Organization         Address  Phone   Notes  [facility]  [phone]   [facility]    [phone]   [facility] [address] [place], [place] [address] [phone]   [facility] [address], [place] [phone]   Planned Parenthood    [phone]   [facility]    [phone]   [facility]  [address], [place] Phone:  [phone](336) [phone], Fax:  [phone] Hours of Operation:  9 am - 6 pm, M-F.  Also accepts Medicaid/Medicare and self-pay.  [facility]  [address], [place] Phone: [phone], Fax: [phone]. Hours of Operation:  8:30 am - 5:30 pm, M-F.  Also accepts Medicaid and self-pay.  [facility] [place] [address], [place] Phone: [phone](336) [phone]   [facility] [address], [place], [place] [phone], Ext. [phone] Mondays & Thursdays: 7-9 AM.  First 15 patients are seen on a first come, first serve basis.    Medicaid-accepting [facility] Providers:  Organization         Address  Phone   Notes  [facility] [address], [place] [phone]) [phone] Also accepts self-pay patients.  [facility] [address], [place]  [phone]   [facility] [address], [place] [phone]) [phone]   [facility] [address] [place] [address], [place] [phone]) [phone]   Renaye RakersVeita Bland [address], [place]   [phone] Only accepts [place] Access [place] patients after they have their name applied to their card.   Self-Pay (no insurance) in [facility]:  Organization         Address  Phone   Notes  Sickle Cell Patients, [facility] [address], [place] [phone]) [phone]   [facility] [address], [place] [phone]) [phone]   [facility] [place]  [address], [place] [phone]   [facility]/Dr. Osei-Bonsu  [address] [place] [address], [place] or [phone], [place] [phone]) [phone] Phone number for both [place] and [place] locations is the same.  [facility] [address], [place] [phone]   [facility] [place] [address] [place] [address], [place] or [address] [phone]   [facility] [address], [place] [phone]) [phone], phone; [phone]) [phone], fax Sees patients 1st and 3rd Saturday of every month.  Must not qualify for public or private insurance (i.e. Medicaid, Medicare, [place] Health Choice, Veterans' Benefits)  Household income should be no more than 200% of the poverty level The clinic cannot treat you if you are pregnant or think you are pregnant  Sexually transmitted diseases are not treated at the clinic.    Dental Care: Organization         Address  Phone  Notes  [facility] [address], [place] [phone] Accepts children up to age 44 who are enrolled in [place] or [place] Health Choice; pregnant women with a Medicaid card; and children who have applied for  Medicaid or  [place] Health Choice, but were declined, whose parents can pay a reduced fee at time of service.  [facility]  [address], [place] [phone] Accepts children up to age 52 who are enrolled in [place] or [place]; pregnant women with a Medicaid card; and children who have applied for Medicaid or [place] Health Choice, but were declined, whose parents can pay a reduced fee at time of service.  [facility] Access PRO[facility]  [place] [phone] Patients are seen by appointment only. Walk-ins are not accepted. [place] will see patients 70 years of age and older. Monday - Tuesday (8am-5pm) Most Wednesdays (8:30-5pm) $30 per visit, cash only  [facility] Adult Dental Access PROGRAM  [address], [facility] [phone] Patients are seen by appointment only. Walk-ins are not accepted. [place] will see patients 48 years of age and older. One Wednesday Evening (Monthly: Volunteer Based).  $30 per visit, cash only  [place]  [phone] for adults; Children under age 32, call Graduate Pediatric Dentistry at [phone]. Children aged 55-14, please call [phone] to request a pediatric application.  Dental services are provided in all areas of dental care including fillings, crowns and bridges, complete and partial dentures, implants, gum treatment, root canals, and extractions. Preventive care is also provided. Treatment is provided to both adults and children. Patients are selected via a lottery and there is often a waiting list.   [facility] [address] [place]  [phone] [email or web address]   [facility] [address] [place], [place] [phone], Ext. [phone] Second and Fourth Thursday of each month, opens at 6:30 AM; Clinic ends at 9 AM.  Patients are seen on a first-come first-served basis, and a limited number  are seen during each clinic.   [facility]  [address] Hillard Danker [place], [place] [phone]   Eligibility Requirements You must have lived in [place], [place], or [place] counties for at least the last three months.   You cannot be eligible for state or federal sponsored [employer], including [employer], [place], or [employer].   You generally cannot be eligible for healthcare insurance through your employer.    How to apply: Eligibility screenings are held every Tuesday and Wednesday afternoon from 1:00 pm until 4:00 pm. You do not need an appointment for the interview!  [facility] [address] [place], [place]   [place]  [place] Department  [place]  [phone]    Behavioral Health Resources in the Community: Intensive Outpatient Programs Organization         Address  Phone  Notes  [place]. [address], [place], [place] [phone]   [facility] [address] [place], [place]   ADS: Alcohol & Drug Svcs [address] [place], [place]   [place] [address],  [place], [place] or [phone]   Substance Abuse Resources Organization         Address  Phone  Notes  Alcohol and Drug Services  [phone]   [facility]  [phone]   [facility]  [phone]   Chinita Pester  [phone]   Residential & Outpatient Substance Abuse Program  [phone]  Psychological Services Organization         Address  Phone  Notes  [facility]  [phone]   [facility] Services  [phone]   [facility] Mental Health [phone] N[address], [place] [phone] or [phone]    Mobile Crisis Teams Organization         Address  Phone  Notes  Therapeutic  Alternatives, Mobile Crisis Care Unit  [phone]   [facility]  [address] [place], [place] [phone]   Doristine Locks [address] [place] [phone]    Self-Help/Support Groups Organization         Address  Phone             Notes  [facility]oc[facility] of [place] - variety of support groups  [phone]- I7437963 Call for more information  Narcotics Anonymous (NA), Caring [facility] [address], [employer] [place]  2 meetings at this location   Statistician         Address  Phone  Notes  [facility] [address],    [place]  [phone]   [facility]  [address], [place] 660630, [place], [place] [phone]   [facility] [address] [place], [place] [phone] Admissions: 8am-3pm M-F  [facility] [address]    [place], [place] [phone]   [facility] [address] [place], [place], [place] [phone]   [facility] [address]  [place], [place] [phone]   [facility] - Intensive Outpatient [address], [place], [place] [phone]   [facility] ([facility]iction Recovery [facility] [address] [place]  [place], [place] [phone] or [phone]   [facility] (RTS) [address], [place], [place] [phone] Accepts Medicaid  [facility] [address] [facility] [address],  [place] [phone] Substance Abuse/Addiction Treatment   [facility] Organization         Address  Phone  Notes  [facility]  [phone]   Angie Fava, PhD [address] [place], [place]   [phone] or [phone]   [facility]   [address] [place], [place] [phone]   [facility] [address], [place], [place] [phone] Insurance/Medicaid/sponsorship through [facility] and Families  [address]                                    [place], [place] [phone] Therapy/tele-psych/case  [place] [address], [place] [phone]    Dr. Lolly Mustache  [phone]   [facility] [place]  [employer] [facility] Dept. 1) [address], [place] 2) [address] [place] 3)  [address], [place] [phone]  [phone]   [facility] Child Abuse Hotline 810-194-6271 or [phone] (After Hours)

## 2013-07-30 NOTE — ED Provider Notes (Signed)
Medical screening examination/treatment/procedure(s) were performed by non-physician practitioner and as supervising physician I was immediately available for consultation/collaboration.   EKG Interpretation None       Kalman Drape, MD 07/30/13 0600

## 2013-08-01 ENCOUNTER — Emergency Department (HOSPITAL_COMMUNITY)
Admission: EM | Admit: 2013-08-01 | Discharge: 2013-08-01 | Disposition: A | Discharge status: Home / Self Care | Payer: Medicaid Other | Attending: Emergency Medicine | Admitting: Emergency Medicine

## 2013-08-01 ENCOUNTER — Encounter (HOSPITAL_COMMUNITY): Payer: Self-pay | Admitting: Emergency Medicine

## 2013-08-01 DIAGNOSIS — M545 Low back pain, unspecified: Secondary | ICD-10-CM | POA: Insufficient documentation

## 2013-08-01 DIAGNOSIS — M549 Dorsalgia, unspecified: Secondary | ICD-10-CM

## 2013-08-01 DIAGNOSIS — R5383 Other fatigue: Secondary | ICD-10-CM

## 2013-08-01 DIAGNOSIS — F209 Schizophrenia, unspecified: Secondary | ICD-10-CM | POA: Insufficient documentation

## 2013-08-01 DIAGNOSIS — F17200 Nicotine dependence, unspecified, uncomplicated: Secondary | ICD-10-CM | POA: Insufficient documentation

## 2013-08-01 DIAGNOSIS — G8921 Chronic pain due to trauma: Secondary | ICD-10-CM | POA: Insufficient documentation

## 2013-08-01 DIAGNOSIS — R209 Unspecified disturbances of skin sensation: Secondary | ICD-10-CM | POA: Insufficient documentation

## 2013-08-01 DIAGNOSIS — I10 Essential (primary) hypertension: Secondary | ICD-10-CM | POA: Insufficient documentation

## 2013-08-01 DIAGNOSIS — F319 Bipolar disorder, unspecified: Secondary | ICD-10-CM | POA: Insufficient documentation

## 2013-08-01 DIAGNOSIS — Z791 Long term (current) use of non-steroidal anti-inflammatories (NSAID): Secondary | ICD-10-CM | POA: Insufficient documentation

## 2013-08-01 DIAGNOSIS — R5381 Other malaise: Secondary | ICD-10-CM | POA: Insufficient documentation

## 2013-08-01 DIAGNOSIS — Z79899 Other long term (current) drug therapy: Secondary | ICD-10-CM | POA: Insufficient documentation

## 2013-08-01 MED ORDER — KETOROLAC TROMETHAMINE 60 MG/2ML IM SOLN
60.0000 mg | Freq: Once | INTRAMUSCULAR | Status: AC
  Administered 2013-08-01: 60 mg via INTRAMUSCULAR
  Filled 2013-08-01: qty 2

## 2013-08-01 MED ORDER — OXYCODONE-ACETAMINOPHEN 5-325 MG PO TABS
1.0000 | ORAL_TABLET | Freq: Once | ORAL | Status: AC
  Administered 2013-08-01: 1 via ORAL
  Filled 2013-08-01: qty 1

## 2013-08-01 NOTE — ED Notes (Signed)
Pt c/o low back pain that began in Dec. From [place] in [place] bus. Pt has seen her PCP and she prescribed her 800 mg Ibuprofen. Pt states "it's not helping I need something stronger". She rates her pain 9/10 that is sharp in character.

## 2013-08-01 NOTE — Discharge Instructions (Signed)
Start eating healthier. Exercise. Look into getting a gym membership with a pool. Try heating pads. Follow up with your doctor as soon as able for further treatment. Continue ibuprofen or naprosyn for pain, you can also take tylenol, continue flexeril.    Back Pain, Adult Low back pain is very common. About 1 in 5 people have back pain.The cause of low back pain is rarely dangerous. The pain often gets better over time.About half of people with a sudden onset of back pain feel better in just 2 weeks. About 8 in 10 people feel better by 6 weeks.  CAUSES Some common causes of back pain include:  Strain of the muscles or ligaments supporting the spine.  Wear and tear (degeneration) of the spinal discs.  Arthritis.  Direct injury to the back. DIAGNOSIS Most of the time, the direct cause of low back pain is not known.However, back pain can be treated effectively even when the exact cause of the pain is unknown.Answering your caregiver's questions about your overall health and symptoms is one of the most accurate ways to make sure the cause of your pain is not dangerous. If your caregiver needs more information, he or she may order lab work or imaging tests (X-rays or MRIs).However, even if imaging tests show changes in your back, this usually does not require surgery. HOME CARE INSTRUCTIONS For many people, back pain returns.Since low back pain is rarely dangerous, it is often a condition that people can learn to [facility] their own.   Remain active. It is stressful on the back to sit or stand in one place. Do not sit, drive, or stand in one place for more than 30 minutes at a time. Take short walks on level surfaces as soon as pain allows.Try to increase the length of time you walk each day.  Do not stay in bed.Resting more than 1 or 2 days can delay your recovery.  Do not avoid exercise or work.Your body is made to move.It is not dangerous to be active, even though your back may  hurt.Your back will likely heal faster if you return to being active before your pain is gone.  Pay attention to your body when you bend and lift. Many people have less discomfortwhen lifting if they bend their knees, keep the load close to their bodies,and avoid twisting. Often, the most comfortable positions are those that put less stress on your recovering back.  Find a comfortable position to sleep. Use a firm mattress and lie on your side with your knees slightly bent. If you lie on your back, put a pillow under your knees.  Only take over-the-counter or prescription medicines as directed by your caregiver. Over-the-counter medicines to reduce pain and inflammation are often the most helpful.Your caregiver may prescribe muscle relaxant drugs.These medicines help dull your pain so you can more quickly return to your normal activities and healthy exercise.  Put ice on the injured area.  Put ice in a plastic bag.  Place a towel between your skin and the bag.  Leave the ice on for 15-20 minutes, 03-04 times a day for the first 2 to 3 days. After that, ice and heat may be alternated to reduce pain and spasms.  Ask your caregiver about trying back exercises and gentle massage. This may be of some benefit.  Avoid feeling anxious or stressed.Stress increases muscle tension and can worsen back pain.It is important to recognize when you are anxious or stressed and learn ways to manage it.Exercise  is a great option. SEEK MEDICAL CARE IF:  You have pain that is not relieved with rest or medicine.  You have pain that does not improve in 1 week.  You have new symptoms.  You are generally not feeling well. SEEK IMMEDIATE MEDICAL CARE IF:   You have pain that radiates from your back into your legs.  You develop new bowel or bladder control problems.  You have unusual weakness or numbness in your arms or legs.  You develop nausea or vomiting.  You develop abdominal pain.  You  feel faint. Document Released: 04/05/2005 Document Revised: 10/05/2011 Document Reviewed: 08/24/2010 [facility] Patient Information 2014 [place], [place].  Back Exercises Back exercises help treat and prevent back injuries. The goal of back exercises is to increase the strength of your abdominal and back muscles and the flexibility of your back. These exercises should be started when you no longer have back pain. Back exercises include:  Pelvic Tilt. Lie on your back with your knees bent. Tilt your pelvis until the lower part of your back is against the floor. Hold this position 5 to 10 sec and repeat 5 to 10 times.  Knee to Chest. Pull first 1 knee up against your chest and hold for 20 to 30 seconds, repeat this with the other knee, and then both knees. This may be done with the other leg straight or bent, whichever feels better.  Sit-Ups or Curl-Ups. Bend your knees 90 degrees. Start with tilting your pelvis, and do a partial, slow sit-up, lifting your trunk only 30 to 45 degrees off the floor. Take at least 2 to 3 seconds for each sit-up. Do not do sit-ups with your knees out straight. If partial sit-ups are difficult, simply do the above but with only tightening your abdominal muscles and holding it as directed.  Hip-Lift. Lie on your back with your knees flexed 90 degrees. Push down with your feet and shoulders as you raise your hips a couple inches off the floor; hold for 10 seconds, repeat 5 to 10 times.  Back arches. Lie on your stomach, propping yourself up on bent elbows. Slowly press on your hands, causing an arch in your low back. Repeat 3 to 5 times. Any initial stiffness and discomfort should lessen with repetition over time.  Shoulder-Lifts. Lie face down with arms beside your body. Keep hips and torso pressed to floor as you slowly lift your head and shoulders off the floor. Do not overdo your exercises, especially in the beginning. Exercises may cause you some mild back discomfort  which lasts for a few minutes; however, if the pain is more severe, or lasts for more than 15 minutes, do not continue exercises until you see your caregiver. Improvement with exercise therapy for back problems is slow.  See your caregivers for assistance with developing a proper back exercise program. Document Released: 05/13/2004 Document Revised: 06/28/2011 Document Reviewed: 02/04/2011 [facility] Patient Information 2014 [place].

## 2013-08-01 NOTE — ED Provider Notes (Signed)
CSN: [id number]     Arrival date & time 08/01/13  1707 History  This chart was scribed for non-physician practitioner, Jeannett Senior, PA-C,working with Osvaldo Shipper, MD, by Marlowe Kays, ED Scribe.  This patient was seen in room WTR8/WTR8 and the patient's care was started at 6:29 PM.  Chief Complaint  Patient presents with  . Back Pain   The history is provided by the patient. No language interpreter was used.   HPI Comments:  [name] is a 43 y.o. obese female brought in by EMS, who presents to the Emergency Department complaining of chronic, constant, severe lower back pain that started four months ago after a fall. Pt states she has been taking Flexeril, Naproxen and Ibuprofen 800 mg with no relief. She states she has not "had time" to follow up with anyone and have an MRI. Pt states she does not have time to be bothered with follow up appointments. Pt also reports generalized bone aches. She reports numbness and weakness of BLE but states it has been there for 4 months. She denies saddle anesthesia and bowel or bladder incontinence. No new injuries. She states her PCP is Dr. Horton Finer.   Past Medical History  Diagnosis Date  . Depression   . Bipolar 1 disorder   . Hypertension   . Hemorrhoid   . Transfusion history   . Schizophrenia    Past Surgical History  Procedure Laterality Date  . Cesarean section     Family History  Problem Relation Age of Onset  . Diabetes type II Other    History  Substance Use Topics  . Smoking status: Current Every Day Smoker -- 1.00 packs/day for 17 years    Types: Cigarettes  . Smokeless tobacco: Never Used  . Alcohol Use: No   OB History   Grav Para Term Preterm Abortions TAB SAB Ect Mult Living                 Review of Systems  Musculoskeletal: Positive for back pain.  Neurological: Positive for weakness and numbness.    Allergies  Flagyl and Sulfa antibiotics  Home Medications   Prior to Admission  medications   Medication Sig Start Date End Date Taking? Authorizing Provider  ARIPiprazole (ABILIFY) 9.75 MG/1.3ML injection Inject 9.75 mg into the muscle every 30 (thirty) days.    Historical Provider, MD  cyclobenzaprine (FLEXERIL) 5 MG tablet Take 1 tablet (5 mg total) by mouth 3 (three) times daily as needed for muscle spasms. 07/29/13   Lucila [place], PA-C  ibuprofen (ADVIL,MOTRIN) 800 MG tablet Take 800 mg by mouth every 8 (eight) hours as needed (pain).    Historical Provider, MD  naproxen (NAPROSYN) 500 MG tablet Take 1 tablet (500 mg total) by mouth 2 (two) times daily with a meal. 07/29/13   Lucila [place], PA-C   Triage Vitals: BP 125/74  Pulse 66  Temp(Src) 98.4 F (36.9 C) (Oral)  Resp 12  SpO2 99%  LMP 07/23/2013 Physical Exam  Nursing note and vitals reviewed. Constitutional: She is oriented to person, place, and time. She appears well-developed and well-nourished.  HENT:  Head: Normocephalic and atraumatic.  Eyes: EOM are normal.  Neck: Normal range of motion.  Cardiovascular: Normal rate.   Pulmonary/Chest: Effort normal.  Musculoskeletal: Normal range of motion.  Midline lumbar spine tenderness, bilateral SI joint tenderness and buttock tenderness. Pain with straight leg raise bilaterally.  Neurological: She is alert and oriented to person, place, and time. She  has normal reflexes.  5/5 and equal lower extremity strength. 2+ and equal patellar reflexes bilaterally. Pt able to dorsiflex bilateral toes and feet with good strength against resistance. Equal sensation bilaterally over thighs and lower legs.   Skin: Skin is warm and dry.  Psychiatric: She has a normal mood and affect. Her behavior is normal.    ED Course  Procedures (including critical care time) DIAGNOSTIC STUDIES: Oxygen Saturation is 99% on RA, normal by my interpretation.   COORDINATION OF CARE: 6:42 PM- Advised pt to follow up with PCP and get specialist appt. Advised pt she should try  water therapy/aerobics but she states she did not want to have to pay for it. Will give pain medication prior to discharge. Pt verbalizes understanding and agrees to plan.  Medications - No data to display  Labs Review Labs Reviewed - No data to display  Imaging Review No results found.   EKG Interpretation None      MDM   Final diagnoses:  Back pain    Patient with back pain for 4 months, has had multiple visits here for her pain. I have explained to her were not going to prescribe her any narcotics for her pain and she needs to followup with her Dr. for further evaluation and treatment. She has no signs or retroflexed to suggest cauda equina. She's afebrile. She has not had any new injuries. I have also explained to her she needs to start exercising, and recommended water therapy. Will d/c home with no prescriptions, she is to continue ibuprofen and flexeril at home. Back exercises given.   Filed Vitals:   08/01/13 1713  BP: 125/74  Pulse: 66  Temp: 98.4 F (36.9 C)  TempSrc: Oral  Resp: 12  SpO2: 99%    I personally performed the services described in this documentation, which was scribed in my presence. The recorded information has been reviewed and is accurate.    Renold Genta, PA-C 08/01/13 1849

## 2013-08-01 NOTE — ED Notes (Signed)
Per EMS: Pt from home.  C/o low back pain.  States that she fell back in December and motrin isn't helping.  "Y'all need to give me something better".

## 2013-08-02 NOTE — ED Provider Notes (Signed)
Medical screening examination/treatment/procedure(s) were performed by non-physician practitioner and as supervising physician I was immediately available for consultation/collaboration.   EKG Interpretation None        Osvaldo Shipper, MD 08/02/13 [phone]

## 2014-01-20 ENCOUNTER — Encounter (HOSPITAL_COMMUNITY): Payer: Self-pay | Admitting: Emergency Medicine

## 2014-01-20 ENCOUNTER — Emergency Department (HOSPITAL_COMMUNITY)
Admission: EM | Admit: 2014-01-20 | Discharge: 2014-01-20 | Disposition: A | Discharge status: Home / Self Care | Payer: Medicaid Other | Attending: Emergency Medicine | Admitting: Emergency Medicine

## 2014-01-20 DIAGNOSIS — I10 Essential (primary) hypertension: Secondary | ICD-10-CM | POA: Insufficient documentation

## 2014-01-20 DIAGNOSIS — Z3202 Encounter for pregnancy test, result negative: Secondary | ICD-10-CM | POA: Diagnosis not present

## 2014-01-20 DIAGNOSIS — N946 Dysmenorrhea, unspecified: Secondary | ICD-10-CM

## 2014-01-20 DIAGNOSIS — Z720 Tobacco use: Secondary | ICD-10-CM | POA: Insufficient documentation

## 2014-01-20 DIAGNOSIS — R109 Unspecified abdominal pain: Secondary | ICD-10-CM | POA: Diagnosis present

## 2014-01-20 DIAGNOSIS — F319 Bipolar disorder, unspecified: Secondary | ICD-10-CM | POA: Diagnosis not present

## 2014-01-20 DIAGNOSIS — F209 Schizophrenia, unspecified: Secondary | ICD-10-CM | POA: Diagnosis not present

## 2014-01-20 DIAGNOSIS — R300 Dysuria: Secondary | ICD-10-CM

## 2014-01-20 LAB — WET PREP, GENITAL
Clue Cells Wet Prep HPF POC: NONE SEEN
Trich, Wet Prep: NONE SEEN
YEAST WET PREP: NONE SEEN

## 2014-01-20 LAB — URINALYSIS, ROUTINE W REFLEX MICROSCOPIC
BILIRUBIN URINE: NEGATIVE
Glucose, UA: NEGATIVE mg/dL
KETONES UR: NEGATIVE mg/dL
Leukocytes, UA: NEGATIVE
NITRITE: NEGATIVE
PROTEIN: NEGATIVE mg/dL
Specific Gravity, Urine: 1.022 (ref 1.005–1.030)
Urobilinogen, UA: 1 mg/dL (ref 0.0–1.0)
pH: 6 (ref 5.0–8.0)

## 2014-01-20 LAB — POC URINE PREG, ED: Preg Test, Ur: NEGATIVE

## 2014-01-20 LAB — URINE MICROSCOPIC-ADD ON

## 2014-01-20 MED ORDER — ACETAMINOPHEN 325 MG PO TABS: 650.0000 mg | ORAL_TABLET | Freq: Once | ORAL | Status: DC

## 2014-01-20 MED ORDER — IBUPROFEN 200 MG PO TABS: 400.0000 mg | ORAL_TABLET | Freq: Once | ORAL | Status: DC

## 2014-01-20 NOTE — ED Provider Notes (Signed)
CSN: [id number]     Arrival date & time 01/20/14  1310 History   First MD Initiated Contact with Patient 01/20/14 1341     Chief Complaint  Patient presents with  . Dysuria  . SEXUALLY TRANSMITTED DISEASE  . Abdominal Pain     (Consider location/radiation/quality/duration/timing/severity/associated sxs/prior Treatment) Patient is a 43 y.o. female presenting with dysuria and abdominal pain. The history is provided by the patient.  Dysuria Associated symptoms: abdominal pain and vaginal discharge   Associated symptoms: no fever, no flank pain, no nausea and no vomiting   Abdominal Pain Associated symptoms: dysuria, vaginal bleeding and vaginal discharge   Associated symptoms: no chest pain, no chills, no constipation, no diarrhea, no fever, no nausea, no shortness of breath, no sore throat and no vomiting   pt c/o mild dysuria and vaginal irritation/discharge for the past couple days.  Symptoms mild. Gradual onset. Persistent. States is at tail end of her normal period. Denies abdominal pain. No nv. Normal appetite. No back or flank pain. No fever or chills. States was told in past had trichomonas, and wants to be checked for same. No known std exposure. Denies recent antibiotic use.      Past Medical History  Diagnosis Date  . Depression   . Bipolar 1 disorder   . Hypertension   . Hemorrhoid   . Transfusion history   . Schizophrenia    Past Surgical History  Procedure Laterality Date  . Cesarean section     Family History  Problem Relation Age of Onset  . Diabetes type II Other    History  Substance Use Topics  . Smoking status: Current Every Day Smoker -- 1.00 packs/day for 17 years    Types: Cigarettes  . Smokeless tobacco: Never Used  . Alcohol Use: No   OB History   Grav Para Term Preterm Abortions TAB SAB Ect Mult Living                 Review of Systems  Constitutional: Negative for fever and chills.  HENT: Negative for sore throat.   Eyes: Negative for  redness.  Respiratory: Negative for shortness of breath.   Cardiovascular: Negative for chest pain.  Gastrointestinal: Positive for abdominal pain. Negative for nausea, vomiting, diarrhea and constipation.  Endocrine: Negative for polyuria.  Genitourinary: Positive for dysuria, vaginal bleeding and vaginal discharge. Negative for flank pain.  Musculoskeletal: Negative for back pain and neck pain.  Skin: Negative for rash.  Neurological: Negative for headaches.  Hematological: Does not bruise/bleed easily.  Psychiatric/Behavioral: Negative for confusion.      Allergies  Flagyl and Sulfa antibiotics  Home Medications   Prior to Admission medications   Medication Sig Start Date End Date Taking? Authorizing Provider  QUEtiapine (SEROQUEL) 100 MG tablet Take 100 mg by mouth at bedtime.   Yes Historical Provider, MD   BP 151/66  Pulse 85  Temp(Src) 99.1 F (37.3 C) (Oral)  Resp 20  SpO2 100% Physical Exam  Nursing note and vitals reviewed. Constitutional: She appears well-developed and well-nourished. No distress.  HENT:  Mouth/Throat: Oropharynx is clear and moist.  Eyes: Conjunctivae are normal. No scleral icterus.  Neck: Neck supple. No tracheal deviation present.  Cardiovascular: Normal rate, regular rhythm, normal heart sounds and intact distal pulses.   Pulmonary/Chest: Effort normal and breath sounds normal. No respiratory distress.  Abdominal: Soft. Normal appearance and bowel sounds are normal. She exhibits no distension and no mass. There is no tenderness. There is no  rebound and no guarding.  obese  Genitourinary:  No cva tenderness. Normal ext exam. Scant amt dark blood in vagina. No active bleeding or discharge from cervix. No cmt. No focal adnexal masses or tenderness.  Musculoskeletal: She exhibits no edema and no tenderness.  Neurological: She is alert.  Skin: Skin is warm and dry. No rash noted. She is not diaphoretic.  Psychiatric: She has a normal mood and  affect.    ED Course  Procedures (including critical care time) Labs Review  Results for orders placed during the hospital encounter of 01/20/14  WET PREP, GENITAL      Result Value Ref Range   Yeast Wet Prep HPF POC NONE SEEN  NONE SEEN   Trich, Wet Prep NONE SEEN  NONE SEEN   Clue Cells Wet Prep HPF POC NONE SEEN  NONE SEEN   WBC, Wet Prep HPF POC FEW (*) NONE SEEN  URINALYSIS, ROUTINE W REFLEX MICROSCOPIC      Result Value Ref Range   Color, Urine YELLOW  YELLOW   APPearance CLEAR  CLEAR   Specific Gravity, Urine 1.022  1.005 - 1.030   pH 6.0  5.0 - 8.0   Glucose, UA NEGATIVE  NEGATIVE mg/dL   Hgb urine dipstick TRACE (*) NEGATIVE   Bilirubin Urine NEGATIVE  NEGATIVE   Ketones, ur NEGATIVE  NEGATIVE mg/dL   Protein, ur NEGATIVE  NEGATIVE mg/dL   Urobilinogen, UA 1.0  0.0 - 1.0 mg/dL   Nitrite NEGATIVE  NEGATIVE   Leukocytes, UA NEGATIVE  NEGATIVE  URINE MICROSCOPIC-ADD ON      Result Value Ref Range   Squamous Epithelial / LPF RARE  RARE   WBC, UA 0-2  <3 WBC/hpf   RBC / HPF 0-2  <3 RBC/hpf   Urine-Other MUCOUS PRESENT    POC URINE PREG, ED      Result Value Ref Range   Preg Test, Ur NEGATIVE  NEGATIVE      MDM  Labs.  Reviewed nursing notes and prior charts for additional history.   Tylenol po.  Motrin po.  ua neg. Wet prep neg.   No purulent d/c on exam, abd soft nt.   On recheck abd soft nt. No nv. Pt tolerating po. Afeb.  Pt appears stable for d/c.  Discussed close pcp f/u.         Mirna Mires, MD 01/20/14 1520

## 2014-01-20 NOTE — ED Notes (Signed)
AVS explained in detail. No other questions/concerns. Knows to increase water intake and stay hydrated. Advised to follow up with OB-GYN at [facility]. Number and address given.

## 2014-01-20 NOTE — Discharge Instructions (Signed)
It was our pleasure to provide your ER care today - we hope that you feel better.  Rest. Drink plenty of fluids. Take motrin or aleve as need.  Follow up with primary care doctor, or ob/gyn doctor, in coming week.  Return to ER if worse, new symptoms, fevers, persistent vomiting, severe abdominal pain, other concern.    Dysuria Dysuria is the medical term for pain with urination. There are many causes for dysuria, but urinary tract infection is the most common. If a urinalysis was performed it can show that there is a urinary tract infection. A urine culture confirms that you or your child is sick. You will need to follow up with a healthcare provider because:  If a urine culture was done you will need to know the culture results and treatment recommendations.  If the urine culture was positive, you or your child will need to be put on antibiotics or know if the antibiotics prescribed are the right antibiotics for your urinary tract infection.  If the urine culture is negative (no urinary tract infection), then other causes may need to be explored or antibiotics need to be stopped. Today laboratory work may have been done and there does not seem to be an infection. If cultures were done they will take at least 24 to 48 hours to be completed. Today x-rays may have been taken and they read as normal. No cause can be found for the problems. The x-rays may be re-read by a radiologist and you will be contacted if additional findings are made. You or your child may have been put on medications to help with this problem until you can see your primary caregiver. If the problems get better, see your primary caregiver if the problems return. If you were given antibiotics (medications which kill germs), take all of the mediations as directed for the full course of treatment.  If laboratory work was done, you need to find the results. Leave a telephone number where you can be reached. If this is not  possible, make sure you find out how you are to get test results. HOME CARE INSTRUCTIONS   Drink lots of fluids. For adults, drink eight, 8 ounce glasses of clear juice or water a day. For children, replace fluids as suggested by your caregiver.  Empty the bladder often. Avoid holding urine for long periods of time.  After a bowel movement, women should cleanse front to back, using each tissue only once.  Empty your bladder before and after sexual intercourse.  Take all the medicine given to you until it is gone. You may feel better in a few days, but TAKE ALL MEDICINE.  Avoid caffeine, tea, alcohol and carbonated beverages, because they tend to irritate the bladder.  In men, alcohol may irritate the prostate.  Only take over-the-counter or prescription medicines for pain, discomfort, or fever as directed by your caregiver.  If your caregiver has given you a follow-up appointment, it is very important to keep that appointment. Not keeping the appointment could result in a chronic or permanent injury, pain, and disability. If there is any problem keeping the appointment, you must call back to this facility for assistance. SEEK IMMEDIATE MEDICAL CARE IF:   Back pain develops.  A fever develops.  There is nausea (feeling sick to your stomach) or vomiting (throwing up).  Problems are no better with medications or are getting worse. MAKE SURE YOU:   Understand these instructions.  Will watch your condition.  Will  get help right away if you are not doing well or get worse. Document Released: 01/02/2004 Document Revised: 06/28/2011 Document Reviewed: 11/09/2007 [facility] Patient Information 2015 [place], [place]. This information is not intended to replace advice given to you by your health care provider. Make sure you discuss any questions you have with your health care provider.    Dysmenorrhea Menstrual cramps (dysmenorrhea) are caused by the muscles of the uterus tightening  (contracting) during a menstrual period. For some women, this discomfort is merely bothersome. For others, dysmenorrhea can be severe enough to interfere with everyday activities for a few days each month. Primary dysmenorrhea is menstrual cramps that last a couple of days when you start having menstrual periods or soon after. This often begins after a teenager starts having her period. As a woman gets older or has a baby, the cramps will usually lessen or disappear. Secondary dysmenorrhea begins later in life, lasts longer, and the pain may be stronger than primary dysmenorrhea. The pain may start before the period and last a few days after the period.  CAUSES  Dysmenorrhea is usually caused by an underlying problem, such as:  The tissue lining the uterus grows outside of the uterus in other areas of the body (endometriosis).  The endometrial tissue, which normally lines the uterus, is found in or grows into the muscular walls of the uterus (adenomyosis).  The pelvic blood vessels are engorged with blood just before the menstrual period (pelvic congestive syndrome).  Overgrowth of cells (polyps) in the lining of the uterus or cervix.  Falling down of the uterus (prolapse) because of loose or stretched ligaments.  Depression.  Bladder problems, infection, or inflammation.  Problems with the intestine, a tumor, or irritable bowel syndrome.  Cancer of the female organs or bladder.  A severely tipped uterus.  A very tight opening or closed cervix.  Noncancerous tumors of the uterus (fibroids).  Pelvic inflammatory disease (PID).  Pelvic scarring (adhesions) from a previous surgery.  Ovarian cyst.  An intrauterine device (IUD) used for birth control. RISK FACTORS You may be at greater risk of dysmenorrhea if:  You are younger than age 67.  You started puberty early.  You have irregular or heavy bleeding.  You have never given birth.  You have a family history of this  problem.  You are a smoker. SIGNS AND SYMPTOMS   Cramping or throbbing pain in your lower abdomen.  Headaches.  Lower back pain.  Nausea or vomiting.  Diarrhea.  Sweating or dizziness.  Loose stools. DIAGNOSIS  A diagnosis is based on your history, symptoms, physical exam, diagnostic tests, or procedures. Diagnostic tests or procedures may include:  Blood tests.  Ultrasonography.  An examination of the lining of the uterus (dilation and curettage, D&C).  An examination inside your abdomen or pelvis with a scope (laparoscopy).  X-rays.  CT scan.  MRI.  An examination inside the bladder with a scope (cystoscopy).  An examination inside the intestine or stomach with a scope (colonoscopy, gastroscopy). TREATMENT  Treatment depends on the cause of the dysmenorrhea. Treatment may include:  Pain medicine prescribed by your health care provider.  Birth control pills or an IUD with progesterone hormone in it.  Hormone replacement therapy.  Nonsteroidal anti-inflammatory drugs (NSAIDs). These may help stop the production of prostaglandins.  Surgery to remove adhesions, endometriosis, ovarian cyst, or fibroids.  Removal of the uterus (hysterectomy).  Progesterone shots to stop the menstrual period.  Cutting the nerves on the sacrum that go  to the female organs (presacral neurectomy).  Electric current to the sacral nerves (sacral nerve stimulation).  Antidepressant medicine.  Psychiatric therapy, counseling, or group therapy.  Exercise and physical therapy.  Meditation and yoga therapy.  Acupuncture. HOME CARE INSTRUCTIONS   Only take over-the-counter or prescription medicines as directed by your health care provider.  Place a heating pad or hot water bottle on your lower back or abdomen. Do not sleep with the heating pad.  Use aerobic exercises, walking, swimming, biking, and other exercises to help lessen the cramping.  Massage to the lower back or  abdomen may help.  Stop smoking.  Avoid alcohol and caffeine. SEEK MEDICAL CARE IF:   Your pain does not get better with medicine.  You have pain with sexual intercourse.  Your pain increases and is not controlled with medicines.  You have abnormal vaginal bleeding with your period.  You develop nausea or vomiting with your period that is not controlled with medicine. SEEK IMMEDIATE MEDICAL CARE IF:  You pass out.  Document Released: 04/05/2005 Document Revised: 12/06/2012 Document Reviewed: 09/21/2012 [facility] Patient Information 2015 [place], [place]. This information is not intended to replace advice given to you by your health care provider. Make sure you discuss any questions you have with your health care provider.

## 2014-01-20 NOTE — ED Notes (Signed)
Pt reports she had unprotected sex a few months ago. Started having vaginal discharge, odor, and dysuria x1 month. Lower abdominal pain 7/10.

## 2014-01-22 LAB — GC/CHLAMYDIA PROBE AMP
CT Probe RNA: NEGATIVE
GC Probe RNA: NEGATIVE

## 2014-02-14 ENCOUNTER — Emergency Department (HOSPITAL_COMMUNITY)
Admission: EM | Admit: 2014-02-14 | Discharge: 2014-02-17 | Disposition: A | Discharge status: Home / Self Care | Payer: Medicaid Other | Attending: Emergency Medicine | Admitting: Emergency Medicine

## 2014-02-14 ENCOUNTER — Encounter (HOSPITAL_COMMUNITY): Payer: Self-pay | Admitting: Emergency Medicine

## 2014-02-14 DIAGNOSIS — I10 Essential (primary) hypertension: Secondary | ICD-10-CM | POA: Insufficient documentation

## 2014-02-14 DIAGNOSIS — R451 Restlessness and agitation: Secondary | ICD-10-CM | POA: Insufficient documentation

## 2014-02-14 DIAGNOSIS — R443 Hallucinations, unspecified: Secondary | ICD-10-CM | POA: Insufficient documentation

## 2014-02-14 DIAGNOSIS — Z720 Tobacco use: Secondary | ICD-10-CM | POA: Diagnosis not present

## 2014-02-14 DIAGNOSIS — F250 Schizoaffective disorder, bipolar type: Secondary | ICD-10-CM | POA: Diagnosis present

## 2014-02-14 DIAGNOSIS — F22 Delusional disorders: Secondary | ICD-10-CM | POA: Diagnosis present

## 2014-02-14 DIAGNOSIS — Z8719 Personal history of other diseases of the digestive system: Secondary | ICD-10-CM | POA: Insufficient documentation

## 2014-02-14 DIAGNOSIS — R4182 Altered mental status, unspecified: Secondary | ICD-10-CM | POA: Diagnosis present

## 2014-02-14 DIAGNOSIS — F419 Anxiety disorder, unspecified: Secondary | ICD-10-CM

## 2014-02-14 LAB — SALICYLATE LEVEL: Salicylate Lvl: <2 mg/dL — ABNORMAL LOW (ref 2.8–20.0)

## 2014-02-14 LAB — RAPID URINE DRUG SCREEN, HOSP PERFORMED
Amphetamines: NOT DETECTED
BARBITURATES: NOT DETECTED
Benzodiazepines: NOT DETECTED
Cocaine: NOT DETECTED
Opiates: NOT DETECTED
TETRAHYDROCANNABINOL: POSITIVE — AB

## 2014-02-14 LAB — COMPREHENSIVE METABOLIC PANEL
ALBUMIN: 4 g/dL (ref 3.5–5.2)
ALK PHOS: 72 U/L (ref 39–117)
ALT: 33 U/L (ref 0–35)
ANION GAP: 15 (ref 5–15)
AST: 98 U/L — ABNORMAL HIGH (ref 0–37)
BUN: 8 mg/dL (ref 6–23)
CO2: 26 mEq/L (ref 19–32)
CREATININE: 0.96 mg/dL (ref 0.50–1.10)
Calcium: 9.1 mg/dL (ref 8.4–10.5)
Chloride: 100 mEq/L (ref 96–112)
GFR, EST AFRICAN AMERICAN: 82 mL/min — AB (ref >90)
GFR, EST NON AFRICAN AMERICAN: 71 mL/min — AB (ref >90)
Glucose, Bld: 101 mg/dL — ABNORMAL HIGH (ref 70–99)
POTASSIUM: 2.7 meq/L — AB (ref 3.7–5.3)
Sodium: 141 mEq/L (ref 137–147)
Total Bilirubin: 0.4 mg/dL (ref 0.3–1.2)
Total Protein: 8.1 g/dL (ref 6.0–8.3)

## 2014-02-14 LAB — CBG MONITORING, ED: Glucose-Capillary: 140 mg/dL — ABNORMAL HIGH (ref 70–99)

## 2014-02-14 LAB — CBC
HEMATOCRIT: 28.9 % — AB (ref 36.0–46.0)
HEMOGLOBIN: 9.3 g/dL — AB (ref 12.0–15.0)
MCH: 24.5 pg — ABNORMAL LOW (ref 26.0–34.0)
MCHC: 32.2 g/dL (ref 30.0–36.0)
MCV: 76.3 fL — AB (ref 78.0–100.0)
Platelets: 331 10*3/uL (ref 150–400)
RBC: 3.79 MIL/uL — ABNORMAL LOW (ref 3.87–5.11)
RDW: 17 % — AB (ref 11.5–15.5)
WBC: 11.1 10*3/uL — AB (ref 4.0–10.5)

## 2014-02-14 LAB — ETHANOL: Alcohol, Ethyl (B): <11 mg/dL (ref 0–11)

## 2014-02-14 LAB — ACETAMINOPHEN LEVEL: Acetaminophen (Tylenol), Serum: <15 ug/mL (ref 10–30)

## 2014-02-14 MED ORDER — IBUPROFEN 200 MG PO TABS: 600.0000 mg | ORAL_TABLET | Freq: Three times a day (TID) | ORAL | Status: DC | PRN

## 2014-02-14 MED ORDER — POTASSIUM CHLORIDE CRYS ER 20 MEQ PO TBCR
40.0000 meq | EXTENDED_RELEASE_TABLET | Freq: Once | ORAL | Status: AC
  Administered 2014-02-14: 40 meq via ORAL
  Filled 2014-02-14: qty 2

## 2014-02-14 MED ORDER — LORAZEPAM 1 MG PO TABS: 1.0000 mg | ORAL_TABLET | Freq: Three times a day (TID) | ORAL | Status: DC | PRN

## 2014-02-14 MED ORDER — POTASSIUM CHLORIDE CRYS ER 20 MEQ PO TBCR: 40.0000 meq | EXTENDED_RELEASE_TABLET | Freq: Every day | ORAL | Status: AC

## 2014-02-14 MED ORDER — ACETAMINOPHEN 325 MG PO TABS: 650.0000 mg | ORAL_TABLET | ORAL | Status: DC | PRN

## 2014-02-14 NOTE — ED Notes (Signed)
Bed: RESB Expected date:  Expected time:  Means of arrival:  Comments: AMS, uncooperative

## 2014-02-14 NOTE — ED Notes (Signed)
Meal given

## 2014-02-14 NOTE — ED Notes (Signed)
Pt refused potassium medication

## 2014-02-14 NOTE — ED Provider Notes (Signed)
CSN: [id number]     Arrival date & time 02/14/14  1436 History   First MD Initiated Contact with Patient 02/14/14 1502     Chief Complaint  Patient presents with  . Altered Mental Status     (Consider location/radiation/quality/duration/timing/severity/associated sxs/prior Treatment) HPI Comments: 43 year old female with a history of schizophrenia who presents via EMS after bizarre behavior. Per report, she was on her hands and knees in front of a [employer]. She was uncooperative with EMS and kept saying there were spiders crawling into her pants.  Patient refused to give any history to me. The only thing that she would say verbally to me was for me to stop shining a light in her eyes.  Level 5 caveat due to patient uncooperativeness.  Patient is a 43 y.o. female presenting with altered mental status.  Altered Mental Status Presenting symptoms: behavior changes   Severity:  Severe Episode history:  Unable to specify Timing:  Unable to specify Progression:  Unable to specify   Past Medical History  Diagnosis Date  . Depression   . Bipolar 1 disorder   . Hypertension   . Hemorrhoid   . Transfusion history   . Schizophrenia    Past Surgical History  Procedure Laterality Date  . Cesarean section     Family History  Problem Relation Age of Onset  . Diabetes type II Other    History  Substance Use Topics  . Smoking status: Current Every Day Smoker -- 1.00 packs/day for 17 years    Types: Cigarettes  . Smokeless tobacco: Never Used  . Alcohol Use: No   OB History   Grav Para Term Preterm Abortions TAB SAB Ect Mult Living                 Review of Systems  Unable to perform ROS     Allergies  Flagyl and Sulfa antibiotics  Home Medications   Prior to Admission medications   Medication Sig Start Date End Date Taking? Authorizing Provider  QUEtiapine (SEROQUEL) 100 MG tablet Take 100 mg by mouth at bedtime.    Historical Provider, MD   BP 146/72  Pulse 110   Temp(Src) 99.2 F (37.3 C) (Oral)  Resp 18  SpO2 96% Physical Exam  Nursing note and vitals reviewed. Constitutional: She appears well-developed and well-nourished. No distress.  HENT:  Head: Normocephalic and atraumatic.  Mouth/Throat: Oropharynx is clear and moist.  Eyes: Conjunctivae are normal. Pupils are equal, round, and reactive to light. No scleral icterus.  Neck: Neck supple.  Cardiovascular: Normal rate, regular rhythm, normal heart sounds and intact distal pulses.   No murmur heard. Pulmonary/Chest: Effort normal and breath sounds normal. No stridor. No respiratory distress. She has no rales.  Abdominal: Soft. Bowel sounds are normal. She exhibits no distension. There is no tenderness.  Musculoskeletal: Normal range of motion.  Neurological:  Uncooperative with exam.  Skin: Skin is warm and dry. No rash noted.  Psychiatric: She is noncommunicative.    ED Course  Procedures (including critical care time) Labs Review Labs Reviewed  CBC - Abnormal; Notable for the following:    WBC 11.1 (*)    RBC 3.79 (*)    Hemoglobin 9.3 (*)    HCT 28.9 (*)    MCV 76.3 (*)    MCH 24.5 (*)    RDW 17.0 (*)    All other components within normal limits  COMPREHENSIVE METABOLIC PANEL - Abnormal; Notable for the following:    Potassium 2.7 (*)  Glucose, Bld 101 (*)    AST 98 (*)    GFR calc non Af Amer 71 (*)    GFR calc Af Amer 82 (*)    All other components within normal limits  SALICYLATE LEVEL - Abnormal; Notable for the following:    Salicylate Lvl <0.3 (*)    All other components within normal limits  URINE RAPID DRUG SCREEN (HOSP PERFORMED) - Abnormal; Notable for the following:    Tetrahydrocannabinol POSITIVE (*)    All other components within normal limits  CBG MONITORING, ED - Abnormal; Notable for the following:    Glucose-Capillary 140 (*)    All other components within normal limits  ACETAMINOPHEN LEVEL  ETHANOL    Imaging Review No results found.   EKG  Interpretation   Date/Time:  Thursday February 14 2014 16:26:57 EDT Ventricular Rate:  90 PR Interval:  144 QRS Duration: 86 QT Interval:  486 QTC Calculation: 595 R Axis:   71 Text Interpretation:  Sinus rhythm Low voltage, precordial leads  Borderline T abnormalities, anterior leads Prolonged QT interval Baseline  wander in lead(s) V2 borderline t wave abnormalities now present compared  to prior Confirmed by [facility]  MD, TREY ([id number]) on 02/14/2014 4:30:14 PM      MDM   Final diagnoses:  Hallucinations  Agitation    43 year old female with history of schizophrenia who presents with altered mental status and behavioral changes. Unable to obtain any history from patient. She appears to be acutely psychotic based on her interactions with EMS and nursing on arrival and based on her complete withdrawal on my exam. IVC papers completed. Plan psychiatric workup and TTS consult.  Given by mouth potassium for her hypokalemia. Patient continues to be uncooperative with history and exam. However, she has shown no signs or symptoms of organic disease. All evidence points toward psychiatric illness. Still awaiting psychiatric consultation.  Artis Delay, MD 02/14/14 [phone]

## 2014-02-14 NOTE — ED Notes (Signed)
Per EMS: Pt found on scene on hands and knees in front [facility] [place] [facility].  Pt was uncooperative with assessment.  Pt stated several times, that she "just wanted to go to the hospital" Pt. was able to state she "was walking with the president and venomous spiders got into my pants and were crawling around my butt." While trying to secure pt on bench pt stated "did you just see the spider that ate my butt, creating a hole." Pt refused all treatment via EMS besides vital signs.

## 2014-02-14 NOTE — ED Notes (Signed)
Pt asked if we could perform EKG, blood work, or if she could give [place] a urine sample. Pt refused, then did not respond to further questions.

## 2014-02-14 NOTE — ED Notes (Signed)
Pt refused to go back to psych ED. Will try again later.

## 2014-02-14 NOTE — ED Notes (Signed)
Pt will not allow me to put hands on her. Pt is refusing EKG and to provide urine specimen. Pt states, " do not put that on me not do not touch me". Pt refuses to answer any questions or acknowledge my presence.

## 2014-02-14 NOTE — ED Notes (Signed)
Pt belongings searched, in locker # [phone]

## 2014-02-14 NOTE — ED Notes (Signed)
Spotting noted on pt's bed. Pt bedding changed, pt changed into mesh panties and pad given.

## 2014-02-14 NOTE — ED Notes (Signed)
Patient refused to let me get her vitals

## 2014-02-15 ENCOUNTER — Encounter (HOSPITAL_COMMUNITY): Payer: Self-pay | Admitting: Psychiatry

## 2014-02-15 DIAGNOSIS — F22 Delusional disorders: Secondary | ICD-10-CM | POA: Diagnosis present

## 2014-02-15 DIAGNOSIS — R45850 Homicidal ideations: Secondary | ICD-10-CM

## 2014-02-15 MED ORDER — QUETIAPINE FUMARATE 100 MG PO TABS: 100.0000 mg | ORAL_TABLET | Freq: Every day | ORAL | Status: DC

## 2014-02-15 MED ORDER — QUETIAPINE FUMARATE 50 MG PO TABS
50.0000 mg | ORAL_TABLET | Freq: Three times a day (TID) | ORAL | Status: DC
  Administered 2014-02-17: 50 mg via ORAL
  Filled 2014-02-15 (×2): qty 1

## 2014-02-15 MED ORDER — POTASSIUM CHLORIDE CRYS ER 20 MEQ PO TBCR
EXTENDED_RELEASE_TABLET | ORAL | Status: AC
  Filled 2014-02-15: qty 2

## 2014-02-15 MED ORDER — QUETIAPINE FUMARATE 50 MG PO TABS
100.0000 mg | ORAL_TABLET | Freq: Every day | ORAL | Status: DC
  Filled 2014-02-15: qty 2

## 2014-02-15 NOTE — ED Notes (Signed)
Report received from [place]. Pt. Sleeping but awakens briefly to answer questions. Pt. Denies SI, HI and AVH. Will continue to monitor for safety. Pt. Instructed to come to me with problems or concerns. Q 15 minute checks continue.

## 2014-02-15 NOTE — BH Assessment (Signed)
Tele Assessment Note   [name] is a 43 y.o. female who presents via IVC petition, initiated by Dr. Orlando Penner).  This Civil engineer, contracting, attempted to interview pt, but she refused to talk to all parties and refused standard medical clearance procedure.  The medical staff stated that pt had to be restrained in order to obtain blood work, however no note is in EPIC regarding this event.  The following information is collateral: EMS found pt on hands and knees in front of the [facility] and she was uncooperative with while they were trying to help her.  Pt told EMS several times that she wanted to go the hospital.  Pt is psychotic, stating that she was walking with the president and venomous spiders had gotten into her pants and crawling in her bottom and burrowing in her skin.  Pt commented "did you just see the spider that ate my butt, creating a hole", other than vital signs, pt refused all treatment offered by EMS.  Pt K-level is not therapeutic, meds were offered but pt refused.  Pt was to going to psych emer dept for holding and refused, she remains in the main emerg dept.     Axis I: Psychotic Disorder NOS Axis II: Deferred Axis III:  Past Medical History  Diagnosis Date  . Depression   . Bipolar 1 disorder   . Hypertension   . Hemorrhoid   . Transfusion history   . Schizophrenia    Axis IV: other psychosocial or environmental problems, problems related to social environment and problems with primary support group Axis V: 21-30 behavior considerably influenced by delusions or hallucinations OR serious impairment in judgment, communication OR inability to function in almost all areas  Past Medical History:  Past Medical History  Diagnosis Date  . Depression   . Bipolar 1 disorder   . Hypertension   . Hemorrhoid   . Transfusion history   . Schizophrenia     Past Surgical History  Procedure Laterality Date  . Cesarean section      Family History:  Family  History  Problem Relation Age of Onset  . Diabetes type II Other     Social History:  reports that she has been smoking Cigarettes.  She has a 17 pack-year smoking history. She has never used smokeless tobacco. She reports that she does not drink alcohol or use illicit drugs.  Additional Social History:  Alcohol / Drug Use Pain Medications: See MAR  Prescriptions: See MAR  Over the Counter: See MAR  History of alcohol / drug use?: No history of alcohol / drug abuse Longest period of sobriety (when/how long): Unk   CIWA: CIWA-Ar BP: 134/55 mmHg Pulse Rate: 76 COWS:    PATIENT STRENGTHS: (choose at least two) NA   Allergies:  Allergies  Allergen Reactions  . Flagyl [Metronidazole Hcl] Nausea Only  . Sulfa Antibiotics Nausea Only    nausea    Home Medications:  (Not in a hospital admission)  OB/GYN Status:  No LMP recorded.  General Assessment Data Location of Assessment: [facility] ED Is this a Tele or Face-to-Face Assessment?: Face-to-Face Is this an Initial Assessment or a Re-assessment for this encounter?: Initial Assessment Living Arrangements: Alone Can pt return to current living arrangement?: Yes Admission Status: Involuntary Is patient capable of signing voluntary admission?: No Transfer from: [facility] Referral Source: MD  Medical Screening Exam ([place]) Medical Exam completed: No Reason for MSE not completed: Other: (None )  [facility] Crisis Care Plan Living Arrangements: Alone Name of Psychiatrist: None  Name of Therapist: None   Education Status Is patient currently in school?: No Current Grade: None  Highest grade of school patient has completed: None  Name of school: None  Contact person: None   Risk to self with the past 6 months Suicidal Ideation: No Suicidal Intent: No Is patient at risk for suicide?: No Suicidal Plan?: No Access to Means: No What has been your use of drugs/alcohol within the last 12 months?: UDS+ for THC  Previous  Attempts/Gestures: No How many times?: 0 Other Self Harm Risks: None  Triggers for Past Attempts: None known Intentional Self Injurious Behavior: None Family Suicide History: No Recent stressful life event(s): Other (Comment) (Chronic Mental Health ) Persecutory voices/beliefs?: No Depression: Yes Depression Symptoms: Feeling angry/irritable Substance abuse history and/or treatment for substance abuse?: No Suicide prevention information given to non-admitted patients: Not applicable  Risk to Others within the past 6 months Homicidal Ideation: No Thoughts of Harm to Others: No Current Homicidal Intent: No Current Homicidal Plan: No Access to Homicidal Means: No Identified Victim: None  History of harm to others?: Yes Assessment of Violence: On admission Violent Behavior Description: Verbally aggressive towards medical staff  Does patient have access to weapons?: No Criminal Charges Pending?: No Does patient have a court date: No  Psychosis Hallucinations: Visual Delusions: Unspecified  Mental Status Report Appear/Hygiene: Disheveled;In scrubs Eye Contact: Poor Motor Activity: Agitation Speech: Aggressive Level of Consciousness: Alert;Irritable Mood: Angry;Irritable;Preoccupied Affect: Irritable;Preoccupied;Angry Anxiety Level: None Thought Processes: Flight of Ideas Judgement: Impaired Orientation: Person;Place;Situation Obsessive Compulsive Thoughts/Behaviors: None  Cognitive Functioning Concentration: Decreased Memory: Recent Intact;Remote Intact IQ: Average Insight: Poor Impulse Control: Poor Appetite: Fair Weight Loss: 0 Weight Gain: 0 Sleep: No Change Total Hours of Sleep: 5 Vegetative Symptoms: None  ADLScreening [facility] Assessment Services) Patient's cognitive ability adequate to safely complete daily activities?: Yes Patient able to express need for assistance with ADLs?: Yes Independently performs ADLs?: Yes (appropriate for developmental  age)  Prior Inpatient Therapy Prior Inpatient Therapy: Yes Prior Therapy Dates: 2007,2009,2010,2013 Prior Therapy Facilty/Provider(s): [facility]  Reason for Treatment: Schizoaffective D/O   Prior Outpatient Therapy Prior Outpatient Therapy: No Prior Therapy Dates: None  Prior Therapy Facilty/Provider(s): None  Reason for Treatment: None   ADL Screening (condition at time of admission) Patient's cognitive ability adequate to safely complete daily activities?: Yes Is the patient deaf or have difficulty hearing?: No Does the patient have difficulty seeing, even when wearing glasses/contacts?: No Does the patient have difficulty concentrating, remembering, or making decisions?: Yes Patient able to express need for assistance with ADLs?: Yes Does the patient have difficulty dressing or bathing?: No Independently performs ADLs?: Yes (appropriate for developmental age) Does the patient have difficulty walking or climbing stairs?: No Weakness of Legs: None Weakness of Arms/Hands: None  Home Assistive Devices/Equipment Home Assistive Devices/Equipment: None  Therapy Consults (therapy consults require a physician order) PT Evaluation Needed: No OT Evalulation Needed: No SLP Evaluation Needed: No Abuse/Neglect Assessment (Assessment to be complete while patient is alone) Physical Abuse: Denies Verbal Abuse: Denies Sexual Abuse: Denies Exploitation of patient/patient's resources: Denies Self-Neglect: Denies Values / Beliefs Cultural Requests During Hospitalization: None Spiritual Requests During Hospitalization: None Consults Spiritual Care Consult Needed: No Social Work Consult Needed: No Regulatory affairs officer (For Healthcare) Does patient have an advance directive?: No Would patient like information on creating an advanced directive?: No - patient declined information Nutrition Screen- [facility] Adult/[facility] Patient's home diet: Regular  Additional  Information 1:1 In Past 12 Months?:  No CIRT Risk: No Elopement Risk: No Does patient have medical clearance?: Yes     Disposition:  Disposition Initial Assessment Completed for this Encounter: Yes Disposition of Patient: Referred to (AM psych eval for final disposition ) Patient referred to: Other (Comment) (AM psych eval for final disposition )  G[place]a Rm 02/15/2014 1:18 AM

## 2014-02-15 NOTE — ED Notes (Signed)
Patient continues to rest in bed with eyes closed.  No acute distress noted.

## 2014-02-15 NOTE — ED Notes (Signed)
Pt transferred to psych ed via stretcher, pt refusing to leave main ed.  Pt asking why is she here, RN explained to pt  That she was found in front of hotel on the ground, combative, agitated and uncooperative, shouting that spiders were crawling all over her and they were burrowing into her skin.  Pt states that is correct.

## 2014-02-15 NOTE — Consult Note (Signed)
[facility] Consult   Reason for Consult:  Delusional Referring Physician:  EDP  [name] is an 43 y.o. female. Total Time spent with patient: 45 minutes  Assessment: AXIS I:  Schizoaffective Disorder AXIS II:  Deferred AXIS III:   Past Medical History  Diagnosis Date  . Depression   . Bipolar 1 disorder   . Hypertension   . Hemorrhoid   . Transfusion history   . Schizophrenia    AXIS IV:  other psychosocial or environmental problems, problems related to social environment and problems with primary support group AXIS V:  21-30 behavior considerably influenced by delusions or hallucinations OR serious impairment in judgment, communication OR inability to function in almost all areas  Plan:  Recommend psychiatric Inpatient admission when medically cleared.  Dr. [name] assessed the patient and concurs with the plan.  Subjective:   [name] is a 43 y.o. female patient admitted with delusions and homicidal ideations.  HPI:  43 y.o. female who presents via IVC petition, initiated by Dr. Doy Mince (EDP). This Civil engineer, contracting, attempted to interview pt, but she refused to talk to all parties and refused standard medical clearance procedure. The medical staff stated that pt had to be restrained in order to obtain blood work, however no note is in EPIC regarding this event. The following information is collateral: EMS found pt on hands and knees in front of the [facility] and she was uncooperative with while they were trying to help her. Pt told EMS several times that she wanted to go the hospital. Pt is psychotic, stating that she was walking with the president and venomous spiders had gotten into her pants and crawling in her bottom and burrowing in her skin. Pt commented "did you just see the spider that ate my butt, creating a hole", other than vital signs, pt refused all treatment offered by EMS. Pt K-level is not therapeutic, meds were offered  but pt refused. Pt was to going to psych emer dept for holding and refused, she remains in the main emerg dept.  Today:  Patient forwarded limited amount of information, states she has already told people, very irritable.  HPI Elements:   Location:  generalized. Quality:  acute. Severity:  severe. Timing:  constant. Duration:  few days. Context:  stressors.  Past Psychiatric History: Past Medical History  Diagnosis Date  . Depression   . Bipolar 1 disorder   . Hypertension   . Hemorrhoid   . Transfusion history   . Schizophrenia     reports that she has been smoking Cigarettes.  She has a 17 pack-year smoking history. She has never used smokeless tobacco. She reports that she does not drink alcohol or use illicit drugs. Family History  Problem Relation Age of Onset  . Diabetes type II Other    Family History Substance Abuse: No Family Supports: No (None reported) Living Arrangements: Alone Can pt return to current living arrangement?: Yes Abuse/Neglect [facility]) Physical Abuse: Denies Verbal Abuse: Denies Sexual Abuse: Denies Allergies:   Allergies  Allergen Reactions  . Flagyl [Metronidazole Hcl] Nausea Only  . Sulfa Antibiotics Nausea Only    nausea    ACT Assessment Complete:  Yes:    Educational Status    Risk to Self: Risk to self with the past 6 months Suicidal Ideation: No Suicidal Intent: No Is patient at risk for suicide?: No Suicidal Plan?: No Access to Means: No What has been your use of drugs/alcohol within  the last 12 months?: UDS+ for THC  Previous Attempts/Gestures: No How many times?: 0 Other Self Harm Risks: None  Triggers for Past Attempts: None known Intentional Self Injurious Behavior: None Family Suicide History: No Recent stressful life event(s): Other (Comment) (Chronic Mental Health ) Persecutory voices/beliefs?: No Depression: Yes Depression Symptoms: Feeling angry/irritable Substance abuse history and/or treatment for substance  abuse?: No Suicide prevention information given to non-admitted patients: Not applicable  Risk to Others: Risk to Others within the past 6 months Homicidal Ideation: No Thoughts of Harm to Others: No Current Homicidal Intent: No Current Homicidal Plan: No Access to Homicidal Means: No Identified Victim: None  History of harm to others?: Yes Assessment of Violence: On admission Violent Behavior Description: Verbally aggressive towards medical staff  Does patient have access to weapons?: No Criminal Charges Pending?: No Does patient have a court date: No  Abuse: Abuse/Neglect Assessment (Assessment to be complete while patient is alone) Physical Abuse: Denies Verbal Abuse: Denies Sexual Abuse: Denies Exploitation of patient/patient's resources: Denies Self-Neglect: Denies  Prior Inpatient Therapy: Prior Inpatient Therapy Prior Inpatient Therapy: Yes Prior Therapy Dates: 2007,2009,2010,2013 Prior Therapy Facilty/Provider(s): [facility]  Reason for Treatment: Schizoaffective D/O   Prior Outpatient Therapy: Prior Outpatient Therapy Prior Outpatient Therapy: No Prior Therapy Dates: None  Prior Therapy Facilty/Provider(s): None  Reason for Treatment: None   Additional Information: Additional Information 1:1 In Past 12 Months?: No CIRT Risk: No Elopement Risk: No Does patient have medical clearance?: Yes                  Objective: Blood pressure 120/73, pulse 95, temperature 97.8 F (36.6 C), temperature source Oral, resp. rate 20, SpO2 99.00%.There is no weight on file to calculate BMI. Results for orders placed during the hospital encounter of 02/14/14 (from the past 72 hour(s))  CBG MONITORING, ED     Status: Abnormal   Collection Time    02/14/14  2:42 PM      Result Value Ref Range   Glucose-Capillary 140 (*) 70 - 99 mg/dL   Comment 1 Notify RN    ACETAMINOPHEN LEVEL     Status: None   Collection Time    02/14/14  4:32 PM      Result Value Ref Range    Acetaminophen (Tylenol), Serum <15.0  10 - 30 ug/mL   Comment:            THERAPEUTIC CONCENTRATIONS VARY     SIGNIFICANTLY. A RANGE OF 10-30     ug/mL MAY BE AN EFFECTIVE     CONCENTRATION FOR MANY PATIENTS.     HOWEVER, SOME ARE BEST TREATED     AT CONCENTRATIONS OUTSIDE THIS     RANGE.     ACETAMINOPHEN CONCENTRATIONS     >150 ug/mL AT 4 HOURS AFTER     INGESTION AND >50 ug/mL AT 12     HOURS AFTER INGESTION ARE     OFTEN ASSOCIATED WITH TOXIC     REACTIONS.  CBC     Status: Abnormal   Collection Time    02/14/14  4:32 PM      Result Value Ref Range   WBC 11.1 (*) 4.0 - 10.5 K/uL   RBC 3.79 (*) 3.87 - 5.11 MIL/uL   Hemoglobin 9.3 (*) 12.0 - 15.0 g/dL   HCT 28.9 (*) 36.0 - 46.0 %   MCV 76.3 (*) 78.0 - 100.0 fL   MCH 24.5 (*) 26.0 - 34.0 pg   MCHC 32.2  30.0 -  36.0 g/dL   RDW 17.0 (*) 11.5 - 15.5 %   Platelets 331  150 - 400 K/uL  COMPREHENSIVE METABOLIC PANEL     Status: Abnormal   Collection Time    02/14/14  4:32 PM      Result Value Ref Range   Sodium 141  137 - 147 mEq/L   Potassium 2.7 (*) 3.7 - 5.3 mEq/L   Comment: REPEATED TO VERIFY     NO VISIBLE HEMOLYSIS     CRITICAL RESULT CALLED TO, READ BACK BY AND VERIFIED WITH:     QUICK MATT RN 1809 02/14/2014 BY BOVELL,T.   Chloride 100  96 - 112 mEq/L   CO2 26  19 - 32 mEq/L   Glucose, Bld 101 (*) 70 - 99 mg/dL   BUN 8  6 - 23 mg/dL   Creatinine, Ser 0.96  0.50 - 1.10 mg/dL   Calcium 9.1  8.4 - 10.5 mg/dL   Total Protein 8.1  6.0 - 8.3 g/dL   Albumin 4.0  3.5 - 5.2 g/dL   AST 98 (*) 0 - 37 U/L   ALT 33  0 - 35 U/L   Alkaline Phosphatase 72  39 - 117 U/L   Total Bilirubin 0.4  0.3 - 1.2 mg/dL   GFR calc non Af Amer 71 (*) >90 mL/min   GFR calc Af Amer 82 (*) >90 mL/min   Comment: (NOTE)     The eGFR has been calculated using the CKD EPI equation.     This calculation has not been validated in all clinical situations.     eGFR's persistently <90 mL/min signify possible Chronic Kidney     Disease.   Anion gap  15  5 - 15  ETHANOL     Status: None   Collection Time    02/14/14  4:32 PM      Result Value Ref Range   Alcohol, Ethyl (B) <11  0 - 11 mg/dL   Comment:            LOWEST DETECTABLE LIMIT FOR     SERUM ALCOHOL IS 11 mg/dL     FOR MEDICAL PURPOSES ONLY  SALICYLATE LEVEL     Status: Abnormal   Collection Time    02/14/14  4:32 PM      Result Value Ref Range   Salicylate Lvl <8.8 (*) 2.8 - 20.0 mg/dL  URINE RAPID DRUG SCREEN (HOSP PERFORMED)     Status: Abnormal   Collection Time    02/14/14  9:51 PM      Result Value Ref Range   Opiates NONE DETECTED  NONE DETECTED   Cocaine NONE DETECTED  NONE DETECTED   Benzodiazepines NONE DETECTED  NONE DETECTED   Amphetamines NONE DETECTED  NONE DETECTED   Tetrahydrocannabinol POSITIVE (*) NONE DETECTED   Barbiturates NONE DETECTED  NONE DETECTED   Comment:            DRUG SCREEN FOR MEDICAL PURPOSES     ONLY.  IF CONFIRMATION IS NEEDED     FOR ANY PURPOSE, NOTIFY LAB     WITHIN 5 DAYS.                LOWEST DETECTABLE LIMITS     FOR URINE DRUG SCREEN     Drug Class       Cutoff (ng/mL)     Amphetamine      1000     Barbiturate      200  Benzodiazepine   031     Tricyclics       594     Opiates          300     Cocaine          300     THC              50   Labs are reviewed and are pertinent for anemia, consistent with her past reports.  Current Facility-Administered Medications  Medication Dose Route Frequency Provider Last Rate Last Dose  . acetaminophen (TYLENOL) tablet 650 mg  650 mg Oral Q4H PRN Artis Delay, MD      . ibuprofen (ADVIL,MOTRIN) tablet 600 mg  600 mg Oral Q8H PRN Artis Delay, MD      . LORazepam (ATIVAN) tablet 1 mg  1 mg Oral Q8H PRN Artis Delay, MD      . potassium chloride SA (K-DUR,KLOR-CON) CR tablet 40 mEq  40 mEq Oral Daily Artis Delay, MD       Current Outpatient Prescriptions  Medication Sig Dispense Refill  . cyclobenzaprine (FLEXERIL) 5 MG tablet Take 5 mg by mouth 3 (three) times daily as  needed for muscle spasms.      . naproxen (NAPROSYN) 500 MG tablet Take 500 mg by mouth 2 (two) times daily with a meal.      . QUEtiapine (SEROQUEL) 100 MG tablet Take 100 mg by mouth at bedtime.        Psychiatric Specialty Exam:     Blood pressure 120/73, pulse 95, temperature 97.8 F (36.6 C), temperature source Oral, resp. rate 20, SpO2 99.00%.There is no weight on file to calculate BMI.  General Appearance: Disheveled  Eye Contact::  Minimal  Speech:  Normal Rate  Volume:  Normal  Mood:  Irritable  Affect:  Flat  Thought Process:  Irrelevant and forwarded little information  Orientation:  Full (Time, Place, and Person)  Thought Content:  Delusions and Hallucinations: Auditory Visual  Suicidal Thoughts:  No  Homicidal Thoughts:  Yes.  with intent/plan  Memory:  Immediate;   Fair Recent;   Poor Remote;   Poor  Judgement:  Impaired  Insight:  Lacking  Psychomotor Activity:  Decreased  Concentration:  Fair  Recall:  [place]: Fair  Akathisia:  No  Handed:  Right  AIMS (if indicated):     Assets:  Leisure Time Resilience  Sleep:      Musculoskeletal: Strength & Muscle Tone: within normal limits Gait & Station: normal Patient leans: N/A  Treatment Plan Summary: Daily contact with patient to assess and evaluate symptoms and progress in treatment Medication management; admit to inpatient psychiatry for stabilization.  Waylan Boga, [place] 02/15/2014 12:52 PM  Patient seen, evaluated and I agree with notes by Nurse Practitioner. Corena Pilgrim, MD

## 2014-02-15 NOTE — ED Notes (Signed)
Pt refused 1200 vitals, attending RN notified.

## 2014-02-15 NOTE — ED Notes (Signed)
Patient reports that she feels something sitting on top of her ski sometimes but she does not know what it is; patient forwards little information and refuses to answer some questons;. patient is irritable at times

## 2014-02-15 NOTE — Progress Notes (Signed)
[facility]p is [facility] [address] [place] [phone]

## 2014-02-15 NOTE — ED Notes (Signed)
Patient refused her 1200 vital signs

## 2014-02-15 NOTE — ED Notes (Signed)
Patient refused her Seroquel and she continues to lay in the bed and just asks for what she needs

## 2014-02-16 MED ORDER — LORAZEPAM 2 MG/ML IJ SOLN: 2.0000 mg | Freq: Once | INTRAMUSCULAR | Status: AC

## 2014-02-16 MED ORDER — LORAZEPAM 1 MG PO TABS
2.0000 mg | ORAL_TABLET | Freq: Once | ORAL | Status: AC
  Administered 2014-02-16: 2 mg via ORAL
  Filled 2014-02-16: qty 2

## 2014-02-16 MED ORDER — BENZTROPINE MESYLATE 1 MG PO TABS
ORAL_TABLET | ORAL | Status: AC
  Filled 2014-02-16: qty 1

## 2014-02-16 MED ORDER — ZIPRASIDONE MESYLATE 20 MG IM SOLR
20.0000 mg | Freq: Once | INTRAMUSCULAR | Status: AC
  Administered 2014-02-16: 20 mg via INTRAMUSCULAR
  Filled 2014-02-16 (×2): qty 20

## 2014-02-16 MED ORDER — POTASSIUM CHLORIDE CRYS ER 20 MEQ PO TBCR
EXTENDED_RELEASE_TABLET | ORAL | Status: AC
  Filled 2014-02-16: qty 2

## 2014-02-16 NOTE — ED Notes (Signed)
Pt up to the bathroom, when approached by this RN the pt held up her hand and responded "I don't want to talk to you" and walked off.  Pt again declined to take her seroquel

## 2014-02-16 NOTE — ED Notes (Signed)
Dr Adele Schilder and shuvon into see

## 2014-02-16 NOTE — ED Notes (Addendum)
Sitting on bed, pt's armband tight-unable to move band/put finger under it, skin redness noted around edges of the bend.  Band removed, Pt declined to allow new band to be placed at this time.  Pt irritable and does not want to talk.

## 2014-02-16 NOTE — ED Notes (Signed)
Pt laying in bed, declined to take potassium or cogentin.  Pt also declined to drink gateraide or eat a banana.  Importance of potassium discussed w/ pt, pt reported "I don't need any potassium."

## 2014-02-16 NOTE — BH Assessment (Signed)
Pt has referred to the following facilities:  [facility]

## 2014-02-16 NOTE — ED Notes (Signed)
Up to the bathroom 

## 2014-02-16 NOTE — ED Notes (Signed)
Dr Phil Dopp and Theodoro Clock aware that pt is declining medications

## 2014-02-16 NOTE — ED Notes (Signed)
Report received from. Pt. Sleeping, respirations regular and unlabored. Will continue to monitor for safety. Q 15 minute checks continue.

## 2014-02-16 NOTE — ED Notes (Signed)
Pt was asked by [facility] if BP and Pulse could be taken for RN. Pt refused BP and Pulse. Pt stated "You need to get that from a real patient!" "I don't need to be here, I need to go!" Pt yelled at [facility] and stated, "Don't bring that machine back in here, and don't bring me no Seroquel either!"

## 2014-02-16 NOTE — Consult Note (Signed)
Psychiatry Follow up Note    [name] is an 43 y.o. female. Total Time spent with patient: 30 minutes  Assessment: AXIS I:  Schizoaffective Disorder AXIS II:  Deferred AXIS III:   Past Medical History  Diagnosis Date  . Depression   . Bipolar 1 disorder   . Hypertension   . Hemorrhoid   . Transfusion history   . Schizophrenia    AXIS IV:  other psychosocial or environmental problems, problems related to social environment and problems with primary support group AXIS V:  21-30 behavior considerably influenced by delusions or hallucinations OR serious impairment in judgment, communication OR inability to function in almost all areas  Plan:  Recommend psychiatric Inpatient admission when medically cleared.    Subjective:   [name] is a 43 y.o. female patient admitted with delusions and homicidal ideations.  Patient seen chart reviewed.  Patient remains very delusional, psychotic and easily irritable.  She refused to talk.  She was started on Seroquel and she has no side effects.  She is refusing medication some time.  Her speech is loud and pressured.  She wanted to be discharge however remained very psychotic and grandiose.  She continues to believe that she has spider venomous in her body and no one cared about that.  She did not cooperate for more information and denies that she has any psychiatric illness.  She endorse threatening behavior .  Patient has multiple hospitalization.  She requires inpatient psychiatric treatment for stabilization.  Past Psychiatric History: Past Medical History  Diagnosis Date  . Depression   . Bipolar 1 disorder   . Hypertension   . Hemorrhoid   . Transfusion history   . Schizophrenia     reports that she has been smoking Cigarettes.  She has a 17 pack-year smoking history. She has never used smokeless tobacco. She reports that she does not drink alcohol or use illicit drugs. Family History  Problem Relation Age of Onset  .  Diabetes type II Other    Family History Substance Abuse: No Family Supports: No (None reported) Living Arrangements: Alone Can pt return to current living arrangement?: Yes Abuse/Neglect [facility]) Physical Abuse: Denies Verbal Abuse: Denies Sexual Abuse: Denies Allergies:   Allergies  Allergen Reactions  . Flagyl [Metronidazole Hcl] Nausea Only  . Sulfa Antibiotics Nausea Only    nausea    ACT Assessment Complete:  Yes:    Educational Status    Risk to Self: Risk to self with the past 6 months Suicidal Ideation: No Suicidal Intent: No Is patient at risk for suicide?: No Suicidal Plan?: No Access to Means: No What has been your use of drugs/alcohol within the last 12 months?: UDS+ for THC  Previous Attempts/Gestures: No How many times?: 0 Other Self Harm Risks: None  Triggers for Past Attempts: None known Intentional Self Injurious Behavior: None Family Suicide History: No Recent stressful life event(s): Other (Comment) (Chronic Mental Health ) Persecutory voices/beliefs?: No Depression: Yes Depression Symptoms: Feeling angry/irritable Substance abuse history and/or treatment for substance abuse?: No Suicide prevention information given to non-admitted patients: Not applicable  Risk to Others: Risk to Others within the past 6 months Homicidal Ideation: No Thoughts of Harm to Others: No Current Homicidal Intent: No Current Homicidal Plan: No Access to Homicidal Means: No Identified Victim: None  History of harm to others?: Yes Assessment of Violence: On admission Violent Behavior Description: Verbally aggressive towards medical staff  Does patient have access to weapons?: No Criminal Charges  Pending?: No Does patient have a court date: No  Abuse: Abuse/Neglect Assessment (Assessment to be complete while patient is alone) Physical Abuse: Denies Verbal Abuse: Denies Sexual Abuse: Denies Exploitation of patient/patient's resources: Denies Self-Neglect: Denies  Prior  Inpatient Therapy: Prior Inpatient Therapy Prior Inpatient Therapy: Yes Prior Therapy Dates: 2007,2009,2010,2013 Prior Therapy Facilty/Provider(s): [facility]  Reason for Treatment: Schizoaffective D/O   Prior Outpatient Therapy: Prior Outpatient Therapy Prior Outpatient Therapy: No Prior Therapy Dates: None  Prior Therapy Facilty/Provider(s): None  Reason for Treatment: None   Additional Information: Additional Information 1:1 In Past 12 Months?: No CIRT Risk: No Elopement Risk: No Does patient have medical clearance?: Yes                  Objective: Blood pressure 120/73, pulse 95, temperature 97.8 F (36.6 C), temperature source Oral, resp. rate 20, SpO2 99.00%.There is no weight on file to calculate BMI. Results for orders placed during the hospital encounter of 02/14/14 (from the past 72 hour(s))  CBG MONITORING, ED     Status: Abnormal   Collection Time    02/14/14  2:42 PM      Result Value Ref Range   Glucose-Capillary 140 (*) 70 - 99 mg/dL   Comment 1 Notify RN    ACETAMINOPHEN LEVEL     Status: None   Collection Time    02/14/14  4:32 PM      Result Value Ref Range   Acetaminophen (Tylenol), Serum <15.0  10 - 30 ug/mL   Comment:            THERAPEUTIC CONCENTRATIONS VARY     SIGNIFICANTLY. A RANGE OF 10-30     ug/mL MAY BE AN EFFECTIVE     CONCENTRATION FOR MANY PATIENTS.     HOWEVER, SOME ARE BEST TREATED     AT CONCENTRATIONS OUTSIDE THIS     RANGE.     ACETAMINOPHEN CONCENTRATIONS     >150 ug/mL AT 4 HOURS AFTER     INGESTION AND >50 ug/mL AT 12     HOURS AFTER INGESTION ARE     OFTEN ASSOCIATED WITH TOXIC     REACTIONS.  CBC     Status: Abnormal   Collection Time    02/14/14  4:32 PM      Result Value Ref Range   WBC 11.1 (*) 4.0 - 10.5 K/uL   RBC 3.79 (*) 3.87 - 5.11 MIL/uL   Hemoglobin 9.3 (*) 12.0 - 15.0 g/dL   HCT 28.9 (*) 36.0 - 46.0 %   MCV 76.3 (*) 78.0 - 100.0 fL   MCH 24.5 (*) 26.0 - 34.0 pg   MCHC 32.2  30.0 - 36.0 g/dL   RDW  17.0 (*) 11.5 - 15.5 %   Platelets 331  150 - 400 K/uL  COMPREHENSIVE METABOLIC PANEL     Status: Abnormal   Collection Time    02/14/14  4:32 PM      Result Value Ref Range   Sodium 141  137 - 147 mEq/L   Potassium 2.7 (*) 3.7 - 5.3 mEq/L   Comment: REPEATED TO VERIFY     NO VISIBLE HEMOLYSIS     CRITICAL RESULT CALLED TO, READ BACK BY AND VERIFIED WITH:     QUICK MATT RN 1809 02/14/2014 BY BOVELL,T.   Chloride 100  96 - 112 mEq/L   CO2 26  19 - 32 mEq/L   Glucose, Bld 101 (*) 70 - 99 mg/dL   BUN 8  6 - 23 mg/dL   Creatinine, Ser 0.96  0.50 - 1.10 mg/dL   Calcium 9.1  8.4 - 10.5 mg/dL   Total Protein 8.1  6.0 - 8.3 g/dL   Albumin 4.0  3.5 - 5.2 g/dL   AST 98 (*) 0 - 37 U/L   ALT 33  0 - 35 U/L   Alkaline Phosphatase 72  39 - 117 U/L   Total Bilirubin 0.4  0.3 - 1.2 mg/dL   GFR calc non Af Amer 71 (*) >90 mL/min   GFR calc Af Amer 82 (*) >90 mL/min   Comment: (NOTE)     The eGFR has been calculated using the CKD EPI equation.     This calculation has not been validated in all clinical situations.     eGFR's persistently <90 mL/min signify possible Chronic Kidney     Disease.   Anion gap 15  5 - 15  ETHANOL     Status: None   Collection Time    02/14/14  4:32 PM      Result Value Ref Range   Alcohol, Ethyl (B) <11  0 - 11 mg/dL   Comment:            LOWEST DETECTABLE LIMIT FOR     SERUM ALCOHOL IS 11 mg/dL     FOR MEDICAL PURPOSES ONLY  SALICYLATE LEVEL     Status: Abnormal   Collection Time    02/14/14  4:32 PM      Result Value Ref Range   Salicylate Lvl <4.4 (*) 2.8 - 20.0 mg/dL  URINE RAPID DRUG SCREEN (HOSP PERFORMED)     Status: Abnormal   Collection Time    02/14/14  9:51 PM      Result Value Ref Range   Opiates NONE DETECTED  NONE DETECTED   Cocaine NONE DETECTED  NONE DETECTED   Benzodiazepines NONE DETECTED  NONE DETECTED   Amphetamines NONE DETECTED  NONE DETECTED   Tetrahydrocannabinol POSITIVE (*) NONE DETECTED   Barbiturates NONE DETECTED  NONE  DETECTED   Comment:            DRUG SCREEN FOR MEDICAL PURPOSES     ONLY.  IF CONFIRMATION IS NEEDED     FOR ANY PURPOSE, NOTIFY LAB     WITHIN 5 DAYS.                LOWEST DETECTABLE LIMITS     FOR URINE DRUG SCREEN     Drug Class       Cutoff (ng/mL)     Amphetamine      1000     Barbiturate      200     Benzodiazepine   920     Tricyclics       100     Opiates          300     Cocaine          300     THC              50   Labs are reviewed and are pertinent for anemia, consistent with her past reports.  Current Facility-Administered Medications  Medication Dose Route Frequency Provider Last Rate Last Dose  . acetaminophen (TYLENOL) tablet 650 mg  650 mg Oral Q4H PRN Artis Delay, MD      . benztropine (COGENTIN) 1 MG tablet           . ibuprofen (ADVIL,MOTRIN) tablet 600  mg  600 mg Oral Q8H PRN Artis Delay, MD      . LORazepam (ATIVAN) tablet 1 mg  1 mg Oral Q8H PRN Artis Delay, MD      . potassium chloride SA (K-DUR,KLOR-CON) 20 MEQ CR tablet           . potassium chloride SA (K-DUR,KLOR-CON) CR tablet 40 mEq  40 mEq Oral Daily Artis Delay, MD      . QUEtiapine (SEROQUEL) tablet 100 mg  100 mg Oral QHS Waylan Boga, NP      . QUEtiapine (SEROQUEL) tablet 50 mg  50 mg Oral TID Waylan Boga, NP       Current Outpatient Prescriptions  Medication Sig Dispense Refill  . cyclobenzaprine (FLEXERIL) 5 MG tablet Take 5 mg by mouth 3 (three) times daily as needed for muscle spasms.      . naproxen (NAPROSYN) 500 MG tablet Take 500 mg by mouth 2 (two) times daily with a meal.      . QUEtiapine (SEROQUEL) 100 MG tablet Take 100 mg by mouth at bedtime.        Psychiatric Specialty Exam:     Blood pressure 120/73, pulse 95, temperature 97.8 F (36.6 C), temperature source Oral, resp. rate 20, SpO2 99.00%.There is no weight on file to calculate BMI.  General Appearance: Disheveled  Eye Contact::  Minimal  Speech:  Normal Rate  Volume:  Normal  Mood:  Irritable  Affect:  Flat   Thought Process:  Circumstantial, Disorganized, Irrelevant and Loose  Orientation:  Full (Time, Place, and Person)  Thought Content:  Delusions and Hallucinations: Auditory Visual  Suicidal Thoughts:  No  Homicidal Thoughts:  Yes.  with intent/plan  Memory:  Immediate;   Fair Recent;   Poor Remote;   Poor  Judgement:  Impaired  Insight:  Lacking  Psychomotor Activity:  Decreased  Concentration:  Fair  Recall:  Raymer: Fair  Akathisia:  No  Handed:  Right  AIMS (if indicated):     Assets:  Leisure Time Resilience  Sleep:      Musculoskeletal: Strength & Muscle Tone: within normal limits Gait & Station: normal Patient leans: N/A  Treatment Plan Summary: Daily contact with patient to assess and evaluate symptoms and progress in treatment Medication management; continue Seroquel , continue to encourage medication compliance.  Patient requires inpatient treatment for stabilization.    ARFEEN,SYED T.,  02/16/2014 12:35 PM

## 2014-02-16 NOTE — ED Notes (Addendum)
Pt. Still sleeping.

## 2014-02-16 NOTE — ED Notes (Signed)
IM medication w/o difficulty, pt agreed to take PO medication.

## 2014-02-17 ENCOUNTER — Encounter (HOSPITAL_COMMUNITY): Payer: Self-pay | Admitting: Registered Nurse

## 2014-02-17 DIAGNOSIS — R443 Hallucinations, unspecified: Secondary | ICD-10-CM | POA: Insufficient documentation

## 2014-02-17 DIAGNOSIS — Z720 Tobacco use: Secondary | ICD-10-CM

## 2014-02-17 DIAGNOSIS — F259 Schizoaffective disorder, unspecified: Secondary | ICD-10-CM

## 2014-02-17 MED ORDER — QUETIAPINE FUMARATE 50 MG PO TABS: 50.0000 mg | ORAL_TABLET | Freq: Three times a day (TID) | ORAL | Status: DC

## 2014-02-17 NOTE — Consult Note (Signed)
Psychiatry Follow up Note    [name] is an 43 y.o. female. Total Time spent with patient: 30 minutes  Assessment: AXIS I:  Schizoaffective Disorder AXIS II:  Deferred AXIS III:   Past Medical History  Diagnosis Date  . Depression   . Bipolar 1 disorder   . Hypertension   . Hemorrhoid   . Transfusion history   . Schizophrenia    AXIS IV:  other psychosocial or environmental problems, problems related to social environment and problems with primary support group AXIS V:  61-70 mild symptoms  Plan:  No evidence of imminent risk to self or others at present.   Patient does not meet criteria for psychiatric inpatient admission. Supportive therapy provided about ongoing stressors. Discussed crisis plan, support from social network, calling 911, coming to the Emergency Department, and calling Suicide Hotline.    Subjective:    HPI:  [name] is a 43 y.o. female patient.  Patient states that she is feeling much better.  Patient states "MY mind is clear.  The last time I saw you I was going through a lot of changes.  I live alone now.  I have my own place.  I'm doing good."  Patient also states that she may have skipped a couple of doses of her medications.  "I know that is not good; it won't happen again." Medication compliance encouraged.  States that she slept well last night. Patient denies suicidal/homicidal ideation, auditory/visual hallucinations, paranoia, and confusion.     Past Psychiatric History: Past Medical History  Diagnosis Date  . Depression   . Bipolar 1 disorder   . Hypertension   . Hemorrhoid   . Transfusion history   . Schizophrenia     reports that she has been smoking Cigarettes.  She has a 17 pack-year smoking history. She has never used smokeless tobacco. She reports that she does not drink alcohol or use illicit drugs. Family History  Problem Relation Age of Onset  . Diabetes type II Other    Family History Substance Abuse:  No Family Supports: No (None reported) Living Arrangements: Alone Can pt return to current living arrangement?: Yes Abuse/Neglect [facility]) Physical Abuse: Denies Verbal Abuse: Denies Sexual Abuse: Denies Allergies:   Allergies  Allergen Reactions  . Flagyl [Metronidazole Hcl] Nausea Only  . Sulfa Antibiotics Nausea Only    nausea    ACT Assessment Complete:  Yes:    Educational Status    Risk to Self: Risk to self with the past 6 months Suicidal Ideation: No Suicidal Intent: No Is patient at risk for suicide?: No Suicidal Plan?: No Access to Means: No What has been your use of drugs/alcohol within the last 12 months?: UDS+ for THC  Previous Attempts/Gestures: No How many times?: 0 Other Self Harm Risks: None  Triggers for Past Attempts: None known Intentional Self Injurious Behavior: None Family Suicide History: No Recent stressful life event(s): Other (Comment) (Chronic Mental Health ) Persecutory voices/beliefs?: No Depression: Yes Depression Symptoms: Feeling angry/irritable Substance abuse history and/or treatment for substance abuse?: No Suicide prevention information given to non-admitted patients: Not applicable  Risk to Others: Risk to Others within the past 6 months Homicidal Ideation: No Thoughts of Harm to Others: No Current Homicidal Intent: No Current Homicidal Plan: No Access to Homicidal Means: No Identified Victim: None  History of harm to others?: Yes Assessment of Violence: On admission Violent Behavior Description: Verbally aggressive towards medical staff  Does patient have access to weapons?:  No Criminal Charges Pending?: No Does patient have a court date: No  Abuse: Abuse/Neglect Assessment (Assessment to be complete while patient is alone) Physical Abuse: Denies Verbal Abuse: Denies Sexual Abuse: Denies Exploitation of patient/patient's resources: Denies Self-Neglect: Denies  Prior Inpatient Therapy: Prior Inpatient Therapy Prior Inpatient  Therapy: Yes Prior Therapy Dates: 2007,2009,2010,2013 Prior Therapy Facilty/Provider(s): [facility] [place]  Reason for Treatment: Schizoaffective D/O   Prior Outpatient Therapy: Prior Outpatient Therapy Prior Outpatient Therapy: No Prior Therapy Dates: None  Prior Therapy Facilty/Provider(s): None  Reason for Treatment: None   Additional Information: Additional Information 1:1 In Past 12 Months?: No CIRT Risk: No Elopement Risk: No Does patient have medical clearance?: Yes        Objective: Blood pressure 104/40, pulse 66, temperature 97.9 F (36.6 C), temperature source Oral, resp. rate 16, SpO2 99 %.There is no weight on file to calculate BMI. Results for orders placed or performed during the hospital encounter of 02/14/14 (from the past 72 hour(s))  CBG monitoring, ED     Status: Abnormal   Collection Time: 02/14/14  2:42 PM  Result Value Ref Range   Glucose-Capillary 140 (H) 70 - 99 mg/dL   Comment 1 Notify RN   Acetaminophen level     Status: None   Collection Time: 02/14/14  4:32 PM  Result Value Ref Range   Acetaminophen (Tylenol), Serum <15.0 10 - 30 ug/mL    Comment:        THERAPEUTIC CONCENTRATIONS VARY SIGNIFICANTLY. A RANGE OF 10-30 ug/mL MAY BE AN EFFECTIVE CONCENTRATION FOR MANY PATIENTS. HOWEVER, SOME ARE BEST TREATED AT CONCENTRATIONS OUTSIDE THIS RANGE. ACETAMINOPHEN CONCENTRATIONS >150 ug/mL AT 4 HOURS AFTER INGESTION AND >50 ug/mL AT 12 HOURS AFTER INGESTION ARE OFTEN ASSOCIATED WITH TOXIC REACTIONS.  CBC     Status: Abnormal   Collection Time: 02/14/14  4:32 PM  Result Value Ref Range   WBC 11.1 (H) 4.0 - 10.5 K/uL   RBC 3.79 (L) 3.87 - 5.11 MIL/uL   Hemoglobin 9.3 (L) 12.0 - 15.0 g/dL   HCT 28.9 (L) 36.0 - 46.0 %   MCV 76.3 (L) 78.0 - 100.0 fL   MCH 24.5 (L) 26.0 - 34.0 pg   MCHC 32.2 30.0 - 36.0 g/dL   RDW 17.0 (H) 11.5 - 15.5 %   Platelets 331 150 - 400 K/uL  Comprehensive metabolic panel     Status: Abnormal   Collection Time: 02/14/14  4:32 PM   Result Value Ref Range   Sodium 141 137 - 147 mEq/L   Potassium 2.7 (LL) 3.7 - 5.3 mEq/L    Comment: REPEATED TO VERIFY NO VISIBLE HEMOLYSIS CRITICAL RESULT CALLED TO, READ BACK BY AND VERIFIED WITH: QUICK MATT RN 1809 02/14/2014 BY BOVELL,T.   Chloride 100 96 - 112 mEq/L   CO2 26 19 - 32 mEq/L   Glucose, Bld 101 (H) 70 - 99 mg/dL   BUN 8 6 - 23 mg/dL   Creatinine, Ser 0.96 0.50 - 1.10 mg/dL   Calcium 9.1 8.4 - 10.5 mg/dL   Total Protein 8.1 6.0 - 8.3 g/dL   Albumin 4.0 3.5 - 5.2 g/dL   AST 98 (H) 0 - 37 U/L   ALT 33 0 - 35 U/L   Alkaline Phosphatase 72 39 - 117 U/L   Total Bilirubin 0.4 0.3 - 1.2 mg/dL   GFR calc non Af Amer 71 (L) >90 mL/min   GFR calc Af Amer 82 (L) >90 mL/min    Comment: (NOTE) The eGFR has  been calculated using the CKD EPI equation. This calculation has not been validated in all clinical situations. eGFR's persistently <90 mL/min signify possible Chronic Kidney Disease.   Anion gap 15 5 - 15  Ethanol (ETOH)     Status: None   Collection Time: 02/14/14  4:32 PM  Result Value Ref Range   Alcohol, Ethyl (B) <11 0 - 11 mg/dL    Comment:        LOWEST DETECTABLE LIMIT FOR SERUM ALCOHOL IS 11 mg/dL FOR MEDICAL PURPOSES ONLY  Salicylate level     Status: Abnormal   Collection Time: 02/14/14  4:32 PM  Result Value Ref Range   Salicylate Lvl <9.1 (L) 2.8 - 20.0 mg/dL  Urine Drug Screen     Status: Abnormal   Collection Time: 02/14/14  9:51 PM  Result Value Ref Range   Opiates NONE DETECTED NONE DETECTED   Cocaine NONE DETECTED NONE DETECTED   Benzodiazepines NONE DETECTED NONE DETECTED   Amphetamines NONE DETECTED NONE DETECTED   Tetrahydrocannabinol POSITIVE (A) NONE DETECTED   Barbiturates NONE DETECTED NONE DETECTED    Comment:        DRUG SCREEN FOR MEDICAL PURPOSES ONLY.  IF CONFIRMATION IS NEEDED FOR ANY PURPOSE, NOTIFY LAB WITHIN 5 DAYS.        LOWEST DETECTABLE LIMITS FOR URINE DRUG SCREEN Drug Class       Cutoff (ng/mL) Amphetamine       1000 Barbiturate      200 Benzodiazepine   694 Tricyclics       503 Opiates          300 Cocaine          300 THC              50   Labs are reviewed and are pertinent for anemia, consistent with her past reports. Medications reviewed   Current Facility-Administered Medications  Medication Dose Route Frequency Provider Last Rate Last Dose  . acetaminophen (TYLENOL) tablet 650 mg  650 mg Oral Q4H PRN Artis Delay, MD      . ibuprofen (ADVIL,MOTRIN) tablet 600 mg  600 mg Oral Q8H PRN Artis Delay, MD      . LORazepam (ATIVAN) tablet 1 mg  1 mg Oral Q8H PRN Artis Delay, MD      . QUEtiapine (SEROQUEL) tablet 100 mg  100 mg Oral QHS Waylan Boga, NP   100 mg at 02/15/14 2225  . QUEtiapine (SEROQUEL) tablet 50 mg  50 mg Oral TID Waylan Boga, NP   50 mg at 02/17/14 8882   Current Outpatient Prescriptions  Medication Sig Dispense Refill  . cyclobenzaprine (FLEXERIL) 5 MG tablet Take 5 mg by mouth 3 (three) times daily as needed for muscle spasms.    . naproxen (NAPROSYN) 500 MG tablet Take 500 mg by mouth 2 (two) times daily with a meal.    . QUEtiapine (SEROQUEL) 100 MG tablet Take 100 mg by mouth at bedtime.      Psychiatric Specialty Exam:     Blood pressure 104/40, pulse 66, temperature 97.9 F (36.6 C), temperature source Oral, resp. rate 16, SpO2 99 %.There is no weight on file to calculate BMI.  General Appearance: Casual and Fairly Groomed  Engineer, water::  Good  Speech:  Clear and Coherent and Normal Rate  Volume:  Normal  Mood:  Anxious  Affect:  Congruent  Thought Process:  Circumstantial and Goal Directed  Orientation:  Full (Time, Place, and Person)  Thought Content:  Rumination  Suicidal Thoughts:  No  Homicidal Thoughts:  No  Memory:  Immediate;   Good Recent;   Good Remote;   Fair  Judgement:  Fair  Insight:  Present  Psychomotor Activity:  Normal  Concentration:  Fair  Recall:  Good  Fund of Knowledge:Fair  Language: Good  Akathisia:  No  Handed:  Right   AIMS (if indicated):     Assets:  Communication Skills Desire for Improvement Housing Leisure Time Resilience  Sleep:      Musculoskeletal: Strength & Muscle Tone: within normal limits Gait & Station: normal Patient leans: N/A  Treatment Plan Summary: Discharge home.  Patient to follow up with primary psych provider    Earleen Newport, FNP-BC 02/17/2014 10:46 AM   I have personally seen the patient and agreed with the findings and involved in the treatment plan. Berniece Andreas, MD

## 2014-02-17 NOTE — ED Notes (Signed)
Up to the bathroom 

## 2014-02-17 NOTE — [facility] Suicide Risk Assessment (Cosign Needed)
Suicide Risk Assessment  Discharge Assessment     Demographic Factors:  Female  Total Time spent with patient: 30 minutes  Psychiatric Specialty Exam:     Blood pressure 104/40, pulse 66, temperature 97.9 F (36.6 C), temperature source Oral, resp. rate 16, SpO2 99 %.There is no weight on file to calculate BMI.  General Appearance: Casual and Fairly Groomed  Engineer, water:: Good  Speech: Clear and Coherent and Normal Rate  Volume: Normal  Mood: Anxious  Affect: Congruent  Thought Process: Circumstantial and Goal Directed  Orientation: Full (Time, Place, and Person)  Thought Content: Rumination  Suicidal Thoughts: No  Homicidal Thoughts: No  Memory: Immediate; Good Recent; Good Remote; Fair  Judgement: Fair  Insight: Present  Psychomotor Activity: Normal  Concentration: Fair  Recall: Good  Fund of Knowledge:Fair  Language: Good  Akathisia: No  Handed: Right  AIMS (if indicated):    Assets: Communication Skills Desire for Improvement Housing Leisure Time Resilience  Sleep:     Musculoskeletal: Strength & Muscle Tone: within normal limits Gait & Station: normal Patient leans: N/A     Mental Status Per Nursing Assessment::   On Admission:     Current Mental Status by Physician: Patient denies suicidal/homicidal ideation, psychosis, and paranoia  Loss Factors: NA  Historical Factors: NA  Risk Reduction Factors:   Religious beliefs about death and Positive social support  Continued Clinical Symptoms:  Previous Psychiatric Diagnoses and Treatments  Cognitive Features That Contribute To Risk:  Closed-mindedness    Suicide Risk:  Minimal: No identifiable suicidal ideation.  Patients presenting with no risk factors but with morbid ruminations; may be classified as minimal risk based on the severity of the depressive symptoms  Discharge Diagnoses:  AXIS I: Schizoaffective Disorder AXIS II:  Deferred AXIS III:  Past Medical History  Diagnosis Date  . Depression   . Bipolar 1 disorder   . Hypertension   . Hemorrhoid   . Transfusion history   . Schizophrenia    AXIS IV: other psychosocial or environmental problems, problems related to social environment and problems with primary support group AXIS V: 61-70 mild symptoms   Plan Of Care/Follow-up recommendations:  Activity:  as tolerated Diet:  as tolerated  Is patient on multiple antipsychotic therapies at discharge:  No   Has Patient had three or more failed trials of antipsychotic monotherapy by history:  No  Recommended Plan for Multiple Antipsychotic Therapies: NA    Patricia Bohlken, FNP-BC 02/17/2014, 11:02 AM

## 2014-02-17 NOTE — Discharge Instructions (Signed)
Schizoaffective Disorder Schizoaffective disorder (ScAD) is a mental illness. It causes symptoms that are a mixture of schizophrenia (a psychotic disorder) and an affective (mood) disorder. The schizophrenic symptoms may include delusions, hallucinations, or odd behavior. The mood symptoms may be similar to major depression or bipolar disorder. ScAD may interfere with personal relationships or normal daily activities. People with ScAD are at increased risk for job loss, social isolation,physical health problems, anxiety and substance use disorders, and suicide. ScAD usually occurs in cycles. Periods of severe symptoms are followed by periods of less severe symptoms or improvement. The illness affects men and women equally but usually appears at an earlier age (teenage or early adult years) in men. People who have family members with schizophrenia, bipolar disorder, or ScAD are at higher risk of developing ScAD. SYMPTOMS  At any one time, people with ScAD may have psychotic symptoms only or both psychotic and mood symptoms. The psychotic symptoms include one or more of the following:  Hearing, seeing, or feeling things that are not there (hallucinations).   Having fixed, false beliefs (delusions). The delusions usually are of being attacked, harassed, cheated, persecuted, or conspired against (paranoid delusions).  Speaking in a way that makes no sense to others (disorganized speech). The psychotic symptoms of ScAD may also include confusing or odd behavior or any of the negative symptoms of schizophrenia. These include loss of motivation for normal daily activities, such as bathing or grooming, withdrawal from other people, and lack of emotions.  The mood symptoms of ScAD occur more often than not. They resemble major depressive disorder or bipolar mania. Symptoms of major depression include depressed mood and four or more of the following:  Loss of interest in usually pleasurable activities  (anhedonia).  Sleeping more or less than normal.  Feeling worthless or excessively guilty.  Lack of energy or motivation.  Trouble concentrating.  Eating more or less than usual.  Thinking a lot about death or suicide. Symptoms of bipolar mania include abnormally elevated or irritable mood and increased energy or activity, plus three or more of the following:   More confidence than normal or feeling that you are able to do anything (grandiosity).  Feeling rested with less sleep than normal.   Being easily distracted.   Talking more than usual or feeling pressured to keep talking.   Feeling that your thoughts are racing.  Engaging in high-risk activities such as buying sprees or foolish business decisions. DIAGNOSIS  ScAD is diagnosed through an assessment by your health care provider. Your health care provider will observe and ask questions about your thoughts, behavior, mood, and ability to function in daily life. Your health care provider may also ask questions about your medical history and use of drugs, including prescription medicines. Your health care provider may also order blood tests and imaging exams. Certain medical conditions and substances can cause symptoms that resemble ScAD. Your health care provider may refer you to a mental health specialist for evaluation.  ScAD is divided into two types. The depressive type is diagnosed if your mood symptoms are limited to major depression. The bipolar type is diagnosed if your mood symptoms are manic or a mixture of manic and depressive symptoms TREATMENT  ScAD is usually a lifelong illness. Long-term treatment is necessary. The following treatments are available:  Medicine. Different types of medicine are used to treat ScAD. The exact combination depends on the type and severity of your symptoms. Antipsychotic medicine is used to control psychotic symptomssuch as delusions, paranoia,  and hallucinations. Mood stabilizers can  even the highs and lows of bipolar manic mood swings. Antidepressant medicines are used to treat major depressive symptoms.  Counseling or talk therapy. Individual, group, or family counseling may be helpful in providing education, support, and guidance. Many people with ScAD also benefit from social skills and job skills (vocational) training. A combination of medicine and counseling is usually best for managing the disorder over time. A procedure in which electricity is applied to the brain through the scalp (electroconvulsive therapy) may be used to treat people with severe manic symptoms that do not respond to medicine and counseling. HOME CARE INSTRUCTIONS   Take all your medicine as prescribed.  Check with your health care provider before starting new prescription or over-the-counter medicines.  Keep all follow up appointments with your health care provider. SEEK MEDICAL CARE IF:   If you are not able to take your medicines as prescribed.  If your symptoms get worse. SEEK IMMEDIATE MEDICAL CARE IF:   You have serious thoughts about hurting yourself or others. Document Released: 08/16/2006 Document Revised: 08/20/2013 Document Reviewed: 11/17/2012 [facility] Patient Information 2015 [place], [place]. This information is not intended to replace advice given to you by your health care provider. Make sure you discuss any questions you have with your health care provider.  Paranoia Paranoia is a distrust of others that is not based on a real reason for distrust. This may reach delusional levels. This means the paranoid person feels the world is against them when there is no reason to make them feel that way. People with paranoia feel as though people around them are "out to get them".  SIMILAR MENTAL ILNESSES  Depression is a feeling as though you are down all the time. It is normal in some situations where you have just lost a loved one. It is abnormal if you are having feelings of paranoia  with it.  Dementia is a physical problem with the brain in which the brain no longer works properly. There are problems with daily activities of living. Alzheimer's disease is one example of this. Dementia is also caused by old age changes in the brain which come with the death of brain cells and small strokes.  Paranoidschizophrenia. People with paranoid schizophrenia and persecutory delusional disorder have delusions in which they feel people around them are plotting against them. Persecutory delusions in paranoid schizophrenia are bizarre, sometimes grandiose, and often accompanied by auditory hallucinations. This means the person is hearing voices that are not there.  Delusionaldisorder (persecutory type). Delusions experienced by individuals with delusional disorder are more believable than those experienced by paranoid schizophrenics; they are not bizarre, though still unjustified. Individuals with delusional disorder may seem offbeat or quirky rather than mentally ill, and therefore, may never seek treatment. All of these problems usually do not allow these people to interact socially in an acceptable manner. CAUSES The cause of paranoia is often not known. It is common in people with extended abuse of:  Cocaine.  Amphetamine.  Marijuana.  Alcohol. Sometimes there is an inherited tendency. It may be associated with stress or changes in brain chemistry. DIAGNOSIS  When paranoia is present, your caregiver may:  Refer you to a specialist.  Do a physical exam.  Perform other tests on you to make sure there are not other problems causing the paranoia including:  Physical problems.  Mental problems.  Chemical problems (other than drugs). Testing may be done to determine if there is a psychiatric disability present  that can be treated with medicine. TREATMENT   Paranoia that is a symptom of a psychiatric problem should be treated by professionals.  Medicines are available which  can help this disorder. Antipsychotic medicine may be prescribed by your caregiver.  Sometimes psychotherapy may be useful.  Conditions such as depression or drug abuse are treated individually. If the paranoia is caused by drug abuse, a treatment facility may be helpful. Depression may be helped by antidepressants. PROGNOSIS   Paranoid people are difficult to treat because of their belief that everyone is out to get them or harm them. Because of this mistrust, they often must be talked into entering treatment by a trusted family member or friend. They may not want to take medicine as they may see this as an attempt to poison them.  Gradual gains in the trust of a therapist or caregiver helps in a successful treatment plan.  Some people with PPD or persecutory delusional disorder function in society without treatment in limited fashion. Document Released: 04/08/2003 Document Revised: 06/28/2011 Document Reviewed: 12/12/2007 [facility] Patient Information 2015 [place], [place]. This information is not intended to replace advice given to you by your health care provider. Make sure you discuss any questions you have with your health care provider.

## 2014-02-17 NOTE — ED Notes (Addendum)
IVC rescended. Written dc instructions and rx x1 reviewed w/ patient.  Pt encouraged to take medication as directed, follow up with he dr.  Abbott Pao verbalized understanding.  Pt ambulatory w/o difficulty to dc window w/ mHt, belongings returned after leaving the unit.  Pt signed e-signature pad, but pad was not working.  Phone number for cab given to pt, pt reports she is going to call a cab to get home.

## 2014-03-03 ENCOUNTER — Inpatient Hospital Stay
Admit: 2014-03-03 | Discharge: 2014-03-12 | Disposition: A | Discharge status: Home / Self Care | Payer: PRIVATE HEALTH INSURANCE | Attending: Psychiatry | Admitting: Psychiatry

## 2014-03-03 DIAGNOSIS — F319 Bipolar disorder, unspecified: Secondary | ICD-10-CM

## 2014-03-03 LAB — METABOLIC PANEL, COMPREHENSIVE
A-G Ratio: 0.8 — ABNORMAL LOW (ref 1.1–2.2)
ALT (SGPT): 21 U/L (ref 12–78)
AST (SGOT): 13 U/L — ABNORMAL LOW (ref 15–37)
Albumin: 3.8 g/dL (ref 3.5–5.0)
Alk. phosphatase: 81 U/L (ref 45–117)
Anion gap: 5 mmol/L (ref 5–15)
BUN/Creatinine ratio: 7 — ABNORMAL LOW (ref 12–20)
BUN: 7 MG/DL (ref 6–20)
Bilirubin, total: 0.6 MG/DL (ref 0.2–1.0)
CO2: 29 mmol/L (ref 21–32)
Calcium: 8.9 MG/DL (ref 8.5–10.1)
Chloride: 105 mmol/L (ref 97–108)
Creatinine: 0.96 MG/DL (ref 0.55–1.02)
GFR est AA: >60 mL/min/{1.73_m2} (ref >60)
GFR est non-AA: >60 mL/min/{1.73_m2} (ref >60)
Globulin: 4.5 g/dL — ABNORMAL HIGH (ref 2.0–4.0)
Glucose: 97 mg/dL (ref 65–100)
Potassium: 3.6 mmol/L (ref 3.5–5.1)
Protein, total: 8.3 g/dL — ABNORMAL HIGH (ref 6.4–8.2)
Sodium: 139 mmol/L (ref 136–145)

## 2014-03-03 LAB — ACETAMINOPHEN: Acetaminophen level: <2 ug/mL — ABNORMAL LOW (ref 10–30)

## 2014-03-03 LAB — URINALYSIS W/ REFLEX CULTURE
Bilirubin: NEGATIVE
Blood: NEGATIVE
Glucose: NEGATIVE mg/dL
Ketone: NEGATIVE mg/dL
Leukocyte Esterase: NEGATIVE
Nitrites: NEGATIVE
Protein: NEGATIVE mg/dL
Specific gravity: 1.019 (ref 1.003–1.030)
Urobilinogen: 0.2 EU/dL (ref 0.2–1.0)
pH (UA): 6.5 (ref 5.0–8.0)

## 2014-03-03 LAB — CBC WITH AUTOMATED DIFF
ABS. BASOPHILS: 0.1 10*3/uL (ref 0.0–0.1)
ABS. EOSINOPHILS: 0.1 10*3/uL (ref 0.0–0.4)
ABS. LYMPHOCYTES: 3.7 10*3/uL — ABNORMAL HIGH (ref 0.8–3.5)
ABS. MONOCYTES: 0.6 10*3/uL (ref 0.0–1.0)
ABS. NEUTROPHILS: 6.3 10*3/uL (ref 1.8–8.0)
BASOPHILS: 1 % (ref 0–1)
EOSINOPHILS: 1 % (ref 0–7)
HCT: 30.1 % — ABNORMAL LOW (ref 35.0–47.0)
HGB: 9.4 g/dL — ABNORMAL LOW (ref 11.5–16.0)
LYMPHOCYTES: 35 % (ref 12–49)
MCH: 24 PG — ABNORMAL LOW (ref 26.0–34.0)
MCHC: 31.2 g/dL (ref 30.0–36.5)
MCV: 76.8 FL — ABNORMAL LOW (ref 80.0–99.0)
MONOCYTES: 5 % (ref 5–13)
NEUTROPHILS: 58 % (ref 32–75)
PLATELET: 366 10*3/uL (ref 150–400)
RBC: 3.92 M/uL (ref 3.80–5.20)
RDW: 17.7 % — ABNORMAL HIGH (ref 11.5–14.5)
WBC: 10.7 10*3/uL (ref 3.6–11.0)

## 2014-03-03 LAB — SALICYLATE: Salicylate level: <1.7 MG/DL — ABNORMAL LOW (ref 2.8–20.0)

## 2014-03-03 LAB — HCG URINE, QL. - POC: Pregnancy test,urine (POC): NEGATIVE

## 2014-03-03 LAB — DRUG SCREEN, URINE
AMPHETAMINES: NEGATIVE
BARBITURATES: NEGATIVE
BENZODIAZEPINES: NEGATIVE
COCAINE: NEGATIVE
METHADONE: NEGATIVE
OPIATES: NEGATIVE
PCP(PHENCYCLIDINE): NEGATIVE
THC (TH-CANNABINOL): NEGATIVE

## 2014-03-03 LAB — ETHYL ALCOHOL: ALCOHOL(ETHYL),SERUM: <10 MG/DL (ref <10)

## 2014-03-03 LAB — TSH 3RD GENERATION: TSH: 3.01 u[IU]/mL (ref 0.36–3.74)

## 2014-03-03 MED ORDER — LORAZEPAM 2 MG/ML IJ SOLN
2 mg/mL | INTRAMUSCULAR | Status: DC | PRN
Start: 2014-03-03 — End: 2014-03-12

## 2014-03-03 MED ORDER — IBUPROFEN 400 MG TAB
400 mg | ORAL | Status: DC | PRN
Start: 2014-03-03 — End: 2014-03-12

## 2014-03-03 MED ORDER — OLANZAPINE 5 MG TAB
5 mg | Freq: Four times a day (QID) | ORAL | Status: DC | PRN
Start: 2014-03-03 — End: 2014-03-12

## 2014-03-03 MED ORDER — BENZTROPINE 1 MG TAB
1 mg | ORAL | Status: DC | PRN
Start: 2014-03-03 — End: 2014-03-04

## 2014-03-03 MED ORDER — HALOPERIDOL LACTATE 5 MG/ML IJ SOLN
5 mg/mL | INTRAMUSCULAR | Status: DC
Start: 2014-03-03 — End: 2014-03-03

## 2014-03-03 MED ORDER — ZIPRASIDONE MESYLATE 20 MG IM SOLR
20 mg/mL (final conc.) | INTRAMUSCULAR | Status: DC | PRN
Start: 2014-03-03 — End: 2014-03-12

## 2014-03-03 MED ORDER — OLANZAPINE 5 MG TAB, RAPID DISSOLVE
5 mg | ORAL | Status: DC
Start: 2014-03-03 — End: 2014-03-03

## 2014-03-03 MED ORDER — NICOTINE 21 MG/24 HR DAILY PATCH
21 mg/24 hr | TRANSDERMAL | Status: DC
Start: 2014-03-03 — End: 2014-03-03

## 2014-03-03 MED ORDER — LORAZEPAM 1 MG TAB
1 mg | ORAL | Status: DC | PRN
Start: 2014-03-03 — End: 2014-03-04

## 2014-03-03 MED ORDER — ZOLPIDEM 5 MG TAB
5 mg | Freq: Every evening | ORAL | Status: DC | PRN
Start: 2014-03-03 — End: 2014-03-12
  Administered 2014-03-07: 02:00:00 via ORAL

## 2014-03-03 MED ORDER — MAGNESIUM HYDROXIDE 400 MG/5 ML ORAL SUSP
400 mg/5 mL | Freq: Every day | ORAL | Status: DC | PRN
Start: 2014-03-03 — End: 2014-03-12

## 2014-03-03 MED ORDER — NICOTINE 21 MG/24 HR DAILY PATCH
21 mg/24 hr | Freq: Every day | TRANSDERMAL | Status: DC | PRN
Start: 2014-03-03 — End: 2014-03-12

## 2014-03-03 MED ORDER — LORAZEPAM 2 MG/ML IJ SOLN
2 mg/mL | Freq: Once | INTRAMUSCULAR | Status: DC
Start: 2014-03-03 — End: 2014-03-03

## 2014-03-03 NOTE — ED Notes (Signed)
Restraints removed per protocol; [place] [facility]; skin is clean, dry, and intact; VSS, pt resting quietly, no concerns at this time.

## 2014-03-03 NOTE — ED Notes (Signed)
Pt ambulatory to restroom. Asked again if I could obtain a urine sample. Pt denies.

## 2014-03-03 NOTE — ED Notes (Signed)
Pt continues to refuse blood work and medications.  Doctor notified, security at bedside.

## 2014-03-03 NOTE — ED Notes (Signed)
Pt pacing in room. Lifting up sheet and pad that is on bed. When asked if she was ok, pt just stared at nurse and did not respond.

## 2014-03-03 NOTE — Behavioral Health Treatment Team (Signed)
Pt. Received  Per wheelchair from [place] [facility] pt. TDO   Pt. Disheveled appearance  Strong body odor  Thought blocking  Difficult to follow commands.  Pt. [place]   Psychotic  Poor historian  Staring blankly        From patients belongings  Info in her bag   [facility] [place]  Bus [place]  Oriented to unit  Skin assessment  Scar left lower forearm  Pt.on 15 min. Checks for safety.

## 2014-03-03 NOTE — Behavioral Health Treatment Team (Addendum)
2350  Pt up to nsg.  Requested juice.  VS obtained.  NEWS = 1.  Provided with cranberry juice per pt request.  Pt is for fasting labs in the am and when pt told of fasting labs stated, "I'm not getting no needles!"  Pt educated verbally by this writer about the am labs and pt continued to say she will refuse any labs.  Steady gait noted.  Angry, labile.  Returned to bed after drinking her juice.  Pt is for a TDO hearing in the am.  Continues on q15 min checks for safety.  Will continue to monitor and assess pt.    0645  Pt refuses to acknowledge staff (2 times) that have called pt to get up for her TDO hearing.  TDO members of hearing arrive and pt continues not to ambulate onto the unit to meet with them.  Pt also continues to refuse am lab work.      [id number]  Writer and Dr. Jeremy JohannKlinekoff of the Hearing team went to pt room where pt again refused to speak and participate in her process for TDO hearing.

## 2014-03-03 NOTE — ED Notes (Signed)
[employer] at bedside.

## 2014-03-03 NOTE — ED Notes (Signed)
Bedside report given to Victoria, RN.  All questions answered.  Pt resting comfortably.  V/S stable, and no distress noted.

## 2014-03-03 NOTE — Behavioral Health Treatment Team (Addendum)
Received pt sitting up in her room, greets oncoming staff, no acute distress noted at this time. Staff will continue to monitor q 15 min checks.

## 2014-03-03 NOTE — Behavioral Health Treatment Team (Signed)
Pressure Ulcer Documentation  (COMPLETE ONE LABEL PER PRESSURE ULCER)  For further information, please review corresponding Wound Care flowsheet.      Christy ShellErica Henry has:    No pressure ulcer noted and pressure ulcer prevention initiated.              PAMELA Sim BoastW BRADSHAW, RN

## 2014-03-03 NOTE — ED Notes (Addendum)
Pt is very evasive in answering questions. Not very cooperative.

## 2014-03-03 NOTE — ED Notes (Signed)
Received report from [place], [place].  Introduced self to patient and asked some screening questions.  Received no verbal response.  Will continue to monitor pt and work with ancillary staff to determine plan of care.

## 2014-03-03 NOTE — ED Provider Notes (Signed)
HPI Comments: 43 y.o. female with past medical history significant for schizoaffective disorder who [place] via EMS [place] the bus stop for mental health evaluation.  Patient is refusing to answer questions and will not cooperative with interview or exam.  PCP: None      Full history, physical exam, and ROS unable to be obtained due to:  patient uncooperative.  Note written by Leotis ShamesLauren A. Alben SpittleWeaver, Scribe, as dictated by Levada SchillingJessica N Emrah Ariola, MD 6:42 AM      The history is provided by the patient and the EMS personnel.        Past Medical History   Diagnosis Date   ??? Schizo affective schizophrenia (HCC)    ;     History reviewed. No pertinent past surgical history.      History reviewed. No pertinent family history.     History     Social History   ??? Marital Status: MARRIED     Spouse Name: N/A     Number of Children: N/A   ??? Years of Education: N/A     Occupational History   ??? Not on file.     Social History Main Topics   ??? Smoking status: Not on file   ??? Smokeless tobacco: Not on file   ??? Alcohol Use: No   ??? Drug Use: Not on file   ??? Sexual Activity: Not on file     Other Topics Concern   ??? Not on file     Social History Narrative   ??? No narrative on file                ALLERGIES: Review of patient's allergies indicates no known allergies.      Review of Systems   Unable to perform ROS      Filed Vitals:    03/03/14 0449   BP: 134/77   Pulse: 73   Temp: 97.8 ??F (36.6 ??C)   Resp: 18   Height: 5\' 4"  (1.626 m)   Weight: 90.719 kg (200 lb)   SpO2: 99%            Physical Exam   Constitutional:   Patient refusing physical exam.        MDM    Procedures    6:06 AM  Per BSMART, patient will be evaluated for TDO.    7:20 AM  Signed out to Dr. Odis LusterEaster. Awaiting [facility] evaluation. She has refused labs and exam. She is refusing to provide history. Therefore, she is not medically cleared at this time.

## 2014-03-03 NOTE — Behavioral Health Treatment Team (Signed)
Primary Nurse Collene SchlichterPAMELA W BRADSHAW, RN and  Elliot DallySandra Smalls BHT  performed a dual skin assessment on this patient No impairment noted  Braden score is 23

## 2014-03-03 NOTE — ED Notes (Addendum)
[facility] representative spoke with pt; pt is refusing to stay voluntarily at this time and is repeating that she "wants to leave".  Risks of refusal and purpose of admission explained to patient.  Pt verbalizes understanding.  Pt refuses to provide blood and urine.

## 2014-03-03 NOTE — Other (Addendum)
Comprehensive Assessment Form Part 1      Section I - Disposition    Axis I - Psychosis NOS    Schizoaffective d/o by hx per patient               Rule out Substance Induced Psychotic d/o  Axis II - deferred  Axis III - none noted  Axis IV - homeless, economic and occupational problems  Axis V - 25      The Medical Doctor to Psychiatrist conference was not completed.  Medical doctor is in agreement with psychiatrist disposition because this counselor conveyed to ED physician the recommendation of the on-call psychiatrist and they concurred.  The plan is to call [facility] to assess patient for a TDO.  The on-call Psychiatrist consulted was Dr. Rachael Darbyhaudry [phone].  The admitting Psychiatrist will be Dr. Rachael Darbyhaudry.  The admitting Diagnosis is Psychosis NOS.  The Payor source is unknown.      Section II - Integrated Summary  Summary:  Patient is a black female who arrives at ED via [facility] with chief complaint of depression and suicidal ideation with no plan. Per [facility] patient was found wandering around outside the bus station. When approached by [facility] patient said, "I haven't had my Seroquel in two weeks." Patient reports taking Seroquel for schizoaffective disorder.   Patient is very evasive in answering questions and not very cooperative. Patient appears to be responding to internal stimulus as evidenced by looking around the room as if she is hearing voices and/or seeing movement. Patient is very erratic in her mood as evidenced by flat affect for a few minutes, then deep belly laughing at random intervals for no apparent reason. Patient also demonstrates disorganized speech and alogia as evidenced by mental confusion.  Patient says she "caught a bus in [place] way [place] to [place] She could not provide any names of friends or relatives in either city.   Patient denies drinking ETOH or using any illicit drugs. However, patient  demonstrates the possibility of being under the influence of a substance as evidenced by slurred speech, brain fog, thought blocking, and laughing at inappropriate times. Patient appeared to be dazed and confused throughout most of the assessment.  Patient affirms suicidal ideation, denies homicidal ideation, denies auditory/visual hallucinations and delusions, and is oriented X4. Patient's ETOH and drug screen are unknown. Pregnancy test is unknown.  Patient reports a history of psych admissions but cannot recall the last hospital she was in.  Patient is amenable to a voluntary psychiatric admission but in this counselor's opinion patient does not demonstrate capacity to give informed consent and does not appear to be able to take care of herself.  This counselor called on-call psych who instructed that patient needed to be assessed for a TDO and to call [facility]. Counselor spoke with worker from [facility] who seemed not to be convinced patient needed a TDO assessment. This counselor encouraged worker to call and speak with the on-call psychiatrist which worker agreed to do and would call counselor back afterwards. Eventually, [facility] agreed [facility] for a TDO.    The patient has not demonstrated mental capacity to provide informed consent.  The information is given by the patient and EMS personnel.  The Chief Complaint is depression and suicidal ideation with no plan.  The Precipitant Factors are multiple previous psych admissions and psych history for schizophrenia.  Previous Hospitalizations: multiple previous psych  admissions  The patient has not previously been in restraints (unknown).  Current Psychiatrist and/or Case Manager is n/a.    Lethality Assessment:    The potential for suicide noted by the following: active psychosis, ideation and means.  The potential for homicide is noted by the following : psychosis.   The patient has not been a perpetrator of sexual or physical abuse (unknown).  There are not pending charges (unknown).  The patient is felt to be at risk for self harm or harm to others.  The attending nurse was advised to request a TDO assessment.    Section III - Psychosocial  The patient's overall mood and attitude is labile and disorganized.  Feelings of helplessness and hopelessness are not observed.  Generalized anxiety is not observed.  Panic is not observed. Phobias are not observed.  Obsessive compulsive tendencies are not observed.      Section IV - Mental Status Exam  The patient's appearance is unkempt, shows poor hygiene and is bizarre.  The patient's behavior shows poor eye contact. The patient is oriented to time, place, person and situation.  The patient's speech is slowed and is soft.  The patient's mood is withdrawn and is irritable.  The range of affect is labile.  The patient's thought content demonstrates brain fog and thought blocking.  The thought process shows loose associations and is blocking.  The patient's perception demonstrated changes in the following:  auditory  visual hallucinations. The patient's memory is impaired.  The patient's appetite shows no evidence of impairment.  The patient's sleep shows no evidence of impairment. The patient shows no insight.  The patient's judgement is psychologically impaired.      Section V - Substance Abuse  The patient is using substances (unknown).      Section VI - Living Arrangements  The patient is married.  The spouses approximate age is unknown and appears to be in unknown health.  The patient is homeless. The patient has children (unknown).  The patient does not plan to return home upon discharge.  The patient does not have legal issues pending (unknown) The patient's source of income comes from unknown source.  Religious and cultural practices have not been voiced at this time.     The patient's greatest support comes from not known and this person will not be involved with the treatment.    The patient has not been in an event described as horrible or outside the realm of ordinary life experience either currently or in the past.  The patient has not been a victim of sexual/physical abuse.    Section VII - Other Areas of Clinical Concern  The highest grade achieved is GED with the overall quality of school experience being described as ok.  The patient is currently unemployed and speaks [place] as a primary language.  The patient has no communication impairments affecting communication. The patient's preference for learning can be described as: learns best by oral information.  The patient's hearing is normal.  The patient's vision is normal.      Sheffield Sliderobert S Esli Jernigan, LPC

## 2014-03-03 NOTE — ED Notes (Signed)
Pt sitting on side of bed resting comfortably. Will continue to monitor.

## 2014-03-03 NOTE — ED Notes (Signed)
Per [facility]SM[facility], Pt is refusing to have labs or urine done. MD made aware.

## 2014-03-03 NOTE — Behavioral Health Treatment Team (Signed)
Did not attend reflections group.

## 2014-03-03 NOTE — Other (Signed)
TRANSFER - OUT REPORT:    Verbal report given to Pam, RN(name) on Georgette ShellErica Reed  being transferred to Acute Psych 736-01(unit) for routine progression of care       Report consisted of patient???s Situation, Background, Assessment and   Recommendations(SBAR).     Information from the following report(s) SBAR, Procedure Summary, Intake/Output, MAR, Recent Results and Med Rec Status was reviewed with the receiving nurse.    Lines:       Opportunity for questions and clarification was provided.      Patient transported with:   Registered Nurse, [place] [employer] Officer

## 2014-03-03 NOTE — ED Notes (Signed)
Pt resting at this time.  Pt continues to refuse bed pan or catheterization to provide urine sample.

## 2014-03-03 NOTE — ED Notes (Signed)
Reminded MD that pt is refusing to have labs drawn or to give urine sample. MD will talk to pt.

## 2014-03-03 NOTE — ED Notes (Signed)
Pt resting quietly at this time.

## 2014-03-03 NOTE — Progress Notes (Signed)
TRANSFER - IN REPORT:    Verbal report received from  [facility] RN on Georgette Shellrica Reed  being received from ER  for routine progression of care      Report consisted of patient???s Situation, Background, Assessment and   Recommendations(SBAR).     Information from the following report(s) SBAR was reviewed with the receiving nurse.    Opportunity for questions and clarification was provided.      Assessment completed upon patient???s arrival to unit and care assumed.

## 2014-03-03 NOTE — ED Notes (Signed)
Patient refusing to allow MD to examine her; patient verbally aggressive with MD and this RN. Shouting "this is not authorized". When trying to question patient further about what she would like continues to yell at this RN.

## 2014-03-03 NOTE — ED Notes (Signed)
Traige: Pt arrives via [place] from a bus stop w/ c/o depression and suicidal ideations. Per EMS pt hasn't had her seroquel in two weeks. She's takes seroquel for schizoeffective disorder. Pt states she does not have a plan.

## 2014-03-04 LAB — CULTURE, URINE
Colonies Counted: <1000
Colony Count: <1000
Culture result:: NO GROWTH
Culture: NO GROWTH

## 2014-03-04 MED ORDER — LORAZEPAM 0.5 MG TAB
0.5 mg | Freq: Two times a day (BID) | ORAL | Status: DC
Start: 2014-03-04 — End: 2014-03-06
  Administered 2014-03-04 – 2014-03-06 (×3): via ORAL

## 2014-03-04 MED ORDER — BENZTROPINE 1 MG TAB
1 mg | Freq: Two times a day (BID) | ORAL | Status: DC
Start: 2014-03-04 — End: 2014-03-06

## 2014-03-04 MED ORDER — FLUPHENAZINE 5 MG TAB
5 mg | Freq: Two times a day (BID) | ORAL | Status: DC
Start: 2014-03-04 — End: 2014-03-06
  Administered 2014-03-05 – 2014-03-06 (×2): via ORAL

## 2014-03-04 MED FILL — BENZTROPINE 1 MG TAB: 1 mg | ORAL | Qty: 1

## 2014-03-04 MED FILL — FLUPHENAZINE 5 MG TAB: 5 mg | ORAL | Qty: 2

## 2014-03-04 MED FILL — LORAZEPAM 0.5 MG TAB: 0.5 mg | ORAL | Qty: 1

## 2014-03-04 NOTE — Behavioral Health Treatment Team (Addendum)
Pt. Up on unit for breakfast   Disheveled appearance   Thought blocking   Staring into space  No verbal response to nurse  TDO hearing held pt. [facility]  Pt. On 15 min. Checks for safety    0930 Dr. Mabeline CarasHaine in to see pt. Disorganized rambling unwilling to give any history or elaborate on information pt. Had in her possession from [facility] [place]    Medications reviewed with pt. "I don't need any medications" "You can't diagnose me and give me medications, I'm from another state"  Planning to pursue court ordered medication hearing. On 15 min. Checks for safety.

## 2014-03-04 NOTE — Behavioral Health Treatment Team (Signed)
Pt refused all bedtime medications.

## 2014-03-04 NOTE — Progress Notes (Signed)
Problem: Altered Thought Process (Adult/Pediatric)  Goal: *STG: Participates in treatment plan  Outcome: Not Progressing Towards Goal  Pt seems unable to participate in treatment planning thus far.

## 2014-03-04 NOTE — H&P (Addendum)
INITIAL PSYCHIATRIC EVALUATION         IDENTIFICATION:    Patient Name  Christy Henry   Date of Birth 1971/02/04   CSN [id number]   Medical Record Number  [id number]      Age  43 y.o.   PCP None   Admit date:  03/03/2014    Room Number  736/01  @ [facility]   Date of Service  03/04/2014            HISTORY         REASON FOR HOSPITALIZATION:  CC: "altered  mental status and psychosis ". Pt admitted under a temporary detention order (TDO) for severe psychosis  proving to be an imminent danger to self and others and an inability to care for self.      HISTORY OF PRESENT ILLNESS:    The patient, Christy Henry, is a 43 y.o.  [place] female with a past psychiatric history significant for for recent dischrge from [facility] in [place]. 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 Patient reports/evidences the following emotional symptoms:  agitation and memeory impairment and paranoia.  The above symptoms have been present for 2-4 days. These symptoms are of severe severity. The symptoms are constant intermittent/ fleeting in nature.  Additional symptomatology include agitation . The patient's condition has been precipitated by unknown factors as the pt. Is a very poor historian and psychosocial stressors (traveling ).  Patient's condition made worse by treatment noncompliance.She maintains that she does not need any medications, despite obvious formal thought disorder.  UDS negative, BAL=0.     PROBLEM LIST:  Patient Active Problem List   Diagnosis Code   ??? Psychosis F29   ??? Altered mental state R41.82        ALLERGIES:  No Known Allergies   MEDICATIONS PRIOR TO ADMISSION:  Prescriptions prior to admission   Medication Sig   ??? QUEtiapine (SEROQUEL) 100 mg tablet Take 100 mg by mouth nightly. Indications: SCHIZOPHRENIA      PAST MEDICAL HISTORY:  Past Medical History   Diagnosis Date    ??? Schizo affective schizophrenia ([place])    History reviewed. No pertinent past surgical history.   SOCIAL HISTORY:    History     Social History   ??? Marital Status: MARRIED     Spouse Name: N/A     Number of Children: N/A   ??? Years of Education: N/A     Occupational History   ??? Not on file.     Social History Main Topics   ??? Smoking status: Not on file   ??? Smokeless tobacco: Not on file   ??? Alcohol Use: No   ??? Drug Use: Not on file   ??? Sexual Activity: Not on file     Other Topics Concern   ??? Not on file     Social History Narrative    43 year old [place] American female admitted ion a TDO from the bus terminal, where she was wandering in a confused state. She was unable to tell examiners where she had come from nor any details about her history, She had paper work with her from [facility] in [place], [place], where she had been briefly admitted for "schizoaffective disorder". She had been discharged on Seroquel 100 mg po qhs and was given a bus ticket to return to [place]. She is unable to give  a reason why she left the bus, which was in transit from [place], [place]. She was uncooperative with the interview and mental status exam, due to thought blocking, paranoia, and being internally preoccupind. She was committed at teh TDO hearing and forced medications were requested,as she refused to take any medications and was quite psychotic.       FAMILY HISTORY: .   History reviewed. No pertinent family history.    REVIEW OF SYSTEMS:   Psychological ROS: positive for - hostility and irritability  Cardiovascular ROS: no chest pain or dyspnea on exertion  Pertinent items are noted in the History of Present Illness. All other Systems reviewed and are considered negative.           MENTAL STATUS EXAM & VITALS       MENTAL STATUS EXAM (MSE):    MSE FINDINGS ARE WITHIN NORMAL LIMITS (WNL) UNLESS OTHERWISE STATED BELOW.    Orientation disorganized    Vital Signs (BP,Pulse, Temp) see below (reviewed 03/04/2014); vital signs are WNL if not listed below.   Gait and Station stable/steady, no ataxia   Abnormal Muscular Movements, Tone, and Behavior no EPS, no Tardive Dyskinesia, no abnormal muscular movements; wnl tone   Relations cooperative, guarded, uncooperative and vague   General Appearance:  age appropriate, disheveled, older than stated age, overweight and poor hygiene   Language no aphasia or dysarthria   Speech:  hyperverbal   Thought Processes Loose associations and thought blocking   Thought Associations disorganized   Thought Content internally preoccupied    Suicidal Ideations no plan  and no intention   Homicidal Ideations no plan    Mood:  hostile and irritable   Affect:  constricted   Memory recent  impaired   Memory remote:  impaired   Concentration/Attention:  impaired   Fund of Knowledge average   Insight:  poor   Reliability poor   Judgment:  limited            VITALS:     Patient Vitals for the past 24 hrs:   Temp Pulse Resp BP SpO2   03/04/14 0800 98.3 ??F (36.8 ??C) 68 16 121/53 mmHg -   03/03/14 2350 98.1 ??F (36.7 ??C) 67 16 143/82 mmHg -   03/03/14 1551 98.1 ??F (36.7 ??C) 80 16 (!) 162/94 mmHg 100 %   03/03/14 1400 98.9 ??F (37.2 ??C) 70 16 151/75 mmHg 97 %   03/03/14 1301 98.1 ??F (36.7 ??C) 77 16 141/72 mmHg 100 %              DATA       LABORATORY DATA:  Labs Reviewed   CBC WITH AUTOMATED DIFF - Abnormal; Notable for the following:     HGB 9.4 (*)     HCT 30.1 (*)     MCV 76.8 (*)     MCH 24.0 (*)     RDW 17.7 (*)     ABS. LYMPHOCYTES 3.7 (*)     All other components within normal limits   METABOLIC PANEL, COMPREHENSIVE - Abnormal; Notable for the following:     BUN/Creatinine ratio 7 (*)     AST 13 (*)     Protein, total 8.3 (*)     Globulin 4.5 (*)     A-G Ratio 0.8 (*)     All other components within normal limits   URINALYSIS W/ REFLEX CULTURE - Abnormal; Notable for the following:     Epithelial cells MODERATE (*)  Bacteria 1+ (*)      UA:UC IF INDICATED URINE CULTURE ORDERED (*)     Mucus 2+ (*)     All other components within normal limits   SALICYLATE - Abnormal; Notable for the following:     SALICYLATE <0.8 (*)     All other components within normal limits   ACETAMINOPHEN - Abnormal; Notable for the following:     ACETAMINOPHEN <2 (*)     All other components within normal limits   CULTURE, URINE   ETHYL ALCOHOL   DRUG SCREEN, URINE   SAMPLES BEING HELD   TSH, 3RD GENERATION   GLUCOSE, FASTING   LIPID PANEL   HCG URINE, QL. - POC     Admission on 03/03/2014   Component Date Value Ref Range Status   ??? WBC 03/03/2014 10.7  3.6 - 11.0 K/uL Final   ??? RBC 03/03/2014 3.92  3.80 - 5.20 M/uL Final   ??? HGB 03/03/2014 9.4* 11.5 - 16.0 g/dL Final   ??? HCT 03/03/2014 30.1* 35.0 - 47.0 % Final   ??? MCV 03/03/2014 76.8* 80.0 - 99.0 FL Final   ??? MCH 03/03/2014 24.0* 26.0 - 34.0 PG Final   ??? MCHC 03/03/2014 31.2  30.0 - 36.5 g/dL Final   ??? RDW 03/03/2014 17.7* 11.5 - 14.5 % Final   ??? PLATELET 03/03/2014 366  150 - 400 K/uL Final   ??? NEUTROPHILS 03/03/2014 58  32 - 75 % Final   ??? LYMPHOCYTES 03/03/2014 35  12 - 49 % Final   ??? MONOCYTES 03/03/2014 5  5 - 13 % Final   ??? EOSINOPHILS 03/03/2014 1  0 - 7 % Final   ??? BASOPHILS 03/03/2014 1  0 - 1 % Final   ??? ABS. NEUTROPHILS 03/03/2014 6.3  1.8 - 8.0 K/UL Final   ??? ABS. LYMPHOCYTES 03/03/2014 3.7* 0.8 - 3.5 K/UL Final   ??? ABS. MONOCYTES 03/03/2014 0.6  0.0 - 1.0 K/UL Final   ??? ABS. EOSINOPHILS 03/03/2014 0.1  0.0 - 0.4 K/UL Final   ??? ABS. BASOPHILS 03/03/2014 0.1  0.0 - 0.1 K/UL Final   ??? Sodium 03/03/2014 139  136 - 145 mmol/L Final   ??? Potassium 03/03/2014 3.6  3.5 - 5.1 mmol/L Final   ??? Chloride 03/03/2014 105  97 - 108 mmol/L Final   ??? CO2 03/03/2014 29  21 - 32 mmol/L Final   ??? Anion gap 03/03/2014 5  5 - 15 mmol/L Final   ??? Glucose 03/03/2014 97  65 - 100 mg/dL Final   ??? BUN 03/03/2014 7  6 - 20 MG/DL Final   ??? Creatinine 03/03/2014 0.96  0.55 - 1.02 MG/DL Final    ??? BUN/Creatinine ratio 03/03/2014 7* 12 - 20   Final   ??? GFR est AA 03/03/2014 >60  >60 ml/min/1.23m Final   ??? GFR est non-AA 03/03/2014 >60  >60 ml/min/1.747mFinal   ??? Calcium 03/03/2014 8.9  8.5 - 10.1 MG/DL Final   ??? Bilirubin, total 03/03/2014 0.6  0.2 - 1.0 MG/DL Final   ??? ALT 03/03/2014 21  12 - 78 U/L Final   ??? AST 03/03/2014 13* 15 - 37 U/L Final   ??? Alk. phosphatase 03/03/2014 81  45 - 117 U/L Final   ??? Protein, total 03/03/2014 8.3* 6.4 - 8.2 g/dL Final   ??? Albumin 03/03/2014 3.8  3.5 - 5.0 g/dL Final   ??? Globulin 03/03/2014 4.5* 2.0 - 4.0 g/dL Final   ??? A-G Ratio  03/03/2014 0.8* 1.1 - 2.2   Final   ??? ALCOHOL(ETHYL),SERUM 03/03/2014 <10  <10 MG/DL Final   ??? Color 03/03/2014 YELLOW/STRAW   Final ??? Appearance 03/03/2014 CLEAR  CLEAR   Final   ??? Specific gravity 03/03/2014 1.019  1.003 - 1.030   Final   ??? pH (UA) 03/03/2014 6.5  5.0 - 8.0   Final   ??? Protein 03/03/2014 NEGATIVE   NEG mg/dL Final   ??? Glucose 03/03/2014 NEGATIVE   NEG mg/dL Final   ??? Ketone 03/03/2014 NEGATIVE   NEG mg/dL Final   ??? Bilirubin 03/03/2014 NEGATIVE   NEG   Final   ??? Blood 03/03/2014 NEGATIVE   NEG   Final   ??? Urobilinogen 03/03/2014 0.2  0.2 - 1.0 EU/dL Final   ??? Nitrites 03/03/2014 NEGATIVE   NEG   Final   ??? Leukocyte Esterase 03/03/2014 NEGATIVE   NEG   Final   ??? WBC 03/03/2014 0-4  0 - 4 /hpf Final   ??? RBC 03/03/2014 0-5  0 - 5 /hpf Final   ??? Epithelial cells 03/03/2014 MODERATE* FEW /lpf Final   ??? Bacteria 03/03/2014 1+* NEG /hpf Final   ??? UA:UC IF INDICATED 03/03/2014 URINE CULTURE ORDERED* CNI   Final   ??? Mucus 03/03/2014 2+* NEG /lpf Final   ??? AMPHETAMINE 03/03/2014 NEGATIVE   NEG   Final   ??? BARBITURATES 03/03/2014 NEGATIVE   NEG   Final   ??? BENZODIAZEPINE 03/03/2014 NEGATIVE   NEG   Final ??? COCAINE 03/03/2014 NEGATIVE   NEG   Final   ??? METHADONE 03/03/2014 NEGATIVE   NEG   Final   ??? OPIATES 03/03/2014 NEGATIVE   NEG   Final   ??? PCP(PHENCYCLIDINE) 03/03/2014 NEGATIVE   NEG   Final    ??? THC (TH-CANNABINOL) 03/03/2014 NEGATIVE   NEG   Final   ??? DRUG SCRN COMMENT 03/03/2014 (NOTE)   Final   ??? SALICYLATE 59/74/1638 <4.5* 2.8 - 20.0 MG/DL Final   ??? ACETAMINOPHEN 03/03/2014 <2* 10 - 30 ug/mL Final   ??? Pregnancy test,urine (POC) 03/03/2014 NEGATIVE   NEG   Final   ??? TSH 03/03/2014 3.01  0.36 - 3.74 uIU/mL Final        RADIOLOGY REPORTS:  No results found for this or any previous visit.No results found.           MEDICATIONS       ALL MEDICATIONS  Current Facility-Administered Medications   Medication Dose Route Frequency   ??? fluPHENAZine (PROLIXIN) tablet 10 mg  10 mg Oral BID   ??? benztropine (COGENTIN) tablet 1 mg  1 mg Oral BID   ??? LORazepam (ATIVAN) tablet 0.5 mg  0.5 mg Oral BID   ??? ziprasidone (GEODON) 20 mg in sterile water (preservative free) injection  20 mg IntraMUSCular Q4H PRN   ??? OLANZapine (ZyPREXA) tablet 5 mg  5 mg Oral Q6H PRN   ??? LORazepam (ATIVAN) injection 2 mg  2 mg IntraMUSCular Q4H PRN   ??? zolpidem (AMBIEN) tablet 5 mg  5 mg Oral QHS PRN ??? ibuprofen (MOTRIN) tablet 400 mg  400 mg Oral Q4H PRN   ??? magnesium hydroxide (MILK OF MAGNESIA) oral suspension 30 mL  30 mL Oral DAILY PRN   ??? nicotine (NICODERM CQ) 21 mg/24 hr patch 1 Patch  1 Patch TransDERmal DAILY PRN      SCHEDULED MEDICATIONS  Current Facility-Administered Medications   Medication Dose Route Frequency   ??? fluPHENAZine (PROLIXIN) tablet  10 mg  10 mg Oral BID   ??? benztropine (COGENTIN) tablet 1 mg  1 mg Oral BID   ??? LORazepam (ATIVAN) tablet 0.5 mg  0.5 mg Oral BID                ASSESSMENT & PLAN        The patient Christy Henry is a 43 y.o.  female who presents at this time for treatment of the following diagnoses:  Patient [facility] Problem List:   Psychosis (03/04/2014)    Assessment:pt. Was dischahrged from [facility] with a dx of Schizoaffective D/O. She has thought blocking, loose associations, paraonia, irritability and is hyperverbal.She reports that she cannot  recall her admission to [facility] not why she disembarked the bus in [place].  Differential diagnosis: R/O delirium (no acute metabolic, infectious,  Toxic, renal, or hepatic etiologies were ideantifiable on initial assessment, and UDS was negative), vs. Schizoaffective Disporder .    Plan: Start antipsychotic medication. Consider Ativan for severe paranoia and echolaia   Altered mental state (03/04/2014)    Assessment: disorientation, loose associations, memory decline, thought blocking. Pt. Appears to be improving re orientation adelirium,nd ability to report some of her past history.     Plan:possible resolving  Delirium- organic causes need to be identified. Treat psychosis with antipsychotic agents.    <principal problem not specified>      In summary, Christy Henry presents with a severe exacerbation of the principal diagnosis, <principal problem not specified>.  While on the unit Christy Henry will be provided with individual, milieu, occupational, group, and substance abuse therapies to address target symptoms as deemed appropriate for the individual patient.           I agree with decision to admit patient. I have spoken to [facility] psychiatric assessor/ED staff regarding the nature of patients's admission at this time.    A coordinated, multidisplinary treatment team round was conducted with the patient; that includes the nurse, unit pharmcist, Catering manager all present.     The following regarding medications was addressed during rounds with patient:   the risks and benefits of the proposed medication. The patient was given the opportunity to ask questions. Informed consent given to the use of the above medications.     I will continue to adjust psychiatric and non-psychiatric medications (see above "medication" section and orders section for details) as deemed appropriate & based upon diagnoses and response to treatment.      I have reviewed admission (and previous/old) labs and medical tests in the EHR and or transferring hospital documents. I will continue to order blood tests/labs and diagnostic tests as deemed appropriate and review results as they become available (see orders for details).    I have reviewed old psychiatric and medical records available in the EHR. I Will order additional psychiatric records from other institutions to further elucidate the nature of patient's psychopathology and review once available.    I will gather additional collateral information from friends, family and o/p treatment team to further elucidate the nature of patient's psychopathology and baselline level of psychiatric functioning.        ESTIMATED LENGTH OF STAY:   TBD       STRENGTHS:  Knowledge of medications                      SIGNED:    Isac Caddy, MD  03/04/2014  The patient continues to refuse medications. Without medications she will remain cognitively disordered and delusional, placing herself and others at risk. Without medications her symptpoms will worsen and prolong her illness.The patient is from [place], and has no known family members to contact for subsituted consent.    M,Amiylah Anastos,M.D.

## 2014-03-04 NOTE — Behavioral Health Treatment Team (Signed)
[place]. Number found and given to Pt. She makes phone call and is overheard having appropriate conversation on phone.

## 2014-03-04 NOTE — Behavioral Health Treatment Team (Incomplete)
Clinical Psychology Note:    Patient participated in a feelings tree group. The group's primary purpose is to help patients gain insight on their current feelings and state of mind. Additionally, their conclusions are processed with a focus on incorporating insight into discharge planning.     Christy Henry was involved in the group and stated that she feels very hopeful and positive. However, when pressed on specifics, she could not articulate. Her thoughts were vague, but she maintained her positivity and her mood was pleasant. She stated that she enjoys people's company and draws comfort from it.     -Brent BullaWaleed Sami, B.S    Supervised by: Ron ParkerLisa Zoppetti, PHD.

## 2014-03-04 NOTE — Behavioral Health Treatment Team (Signed)
GROUP THERAPY PROGRESS NOTE    Christy Henry is participating in [employer].     Group time: 15 minutes    Personal goal for participation: to be happy and not scared of my environment.    Goal orientation: personal    Group therapy participation: active    Therapeutic interventions reviewed and discussed: Writer listened attentively and explained unit rules.    Impression of participation: Patient participated actively.

## 2014-03-04 NOTE — Behavioral Health Treatment Team (Signed)
Pt has spent majority of time in room sleeping.  Pt out of room for dinner, and returned to room after finishing meal.

## 2014-03-04 NOTE — Behavioral Health Treatment Team (Signed)
Second Opinion (regarding): Capacity to refuse medications    Reason for Admission:  Christy Henry was admitted for severe psychosis and mania posing a risk of harm to herself and others.    Diagnosis: Psychosis NOS  The patient does not recognize or acknowledge that she has a mental illness requiring medications to stabilize her mental status.  The patient does not recognize the dangers of not taking the recommended psychiatric medications.  She continues to refuse medications.     Determination: Based on my independent evaluation of the patient on 03/04/2014, the patient does not have the capacity to refuse the psychiatric medications currently recommended for her illness. She stated that she does not want to take medications because she is "Not from [place] and concerns about tremors as a side effect. No religious reasons given.     Re[facility]. The patient does not have a surrogate decision maker or guardian to make these decisions on her behalf.      Signed By:   Vita Barleyashida N. Syed Zukas, MD  03/04/2014

## 2014-03-04 NOTE — Behavioral Health Treatment Team (Signed)
Court ordered medication hearing on 03/05/14 at 0730. Pt has been notified and a copy of paperwork provided.

## 2014-03-04 NOTE — Behavioral Health Treatment Team (Signed)
Pt received this morning resting in bed quietly. Pt has blanket over her head and does not respond when staff greets her. Pt appears to be in no acute distress. Staff will continue to monitor with Q 15 minute safety checks.

## 2014-03-04 NOTE — Progress Notes (Signed)
GROUP THERAPY PROGRESS NOTE    Christy Henry is participating in Process Group.     Group time: 45 minutes    Group therapy participation:        Alcario Droughtrica was difficult in group today. She initially refused to give her name to this provider. She seemed to have a negative attitude in her interactions in group.

## 2014-03-04 NOTE — Behavioral Health Treatment Team (Signed)
PSYCHOSOCIAL ASSESSMENT    Patient identifying info:  Georgette Shellrica Reed is a 43 y.o., female admitted 03/03/2014  4:52 AM     Presenting problem and precipitating factors:Patient was brought to the ER due [place] psych issues [place] bus station .  She was just released from [facility] in [place] .      Current psychiatric providers and contact info:no current/ refused to state during treatment      Previous psychiatric services/providers and response to treatment:patient refuses to state if she has had any other admissions,  She was recently discharged form [facility] in [place]    Substance abuse history:   History   Substance Use Topics   ??? Smoking status: Not on file   ??? Smokeless tobacco: Not on file   ??? Alcohol Use: No       Family constellation:refused to state    Is significant other involved?     Describe support system:     Describe living arrangements and home environment:     Health issues: none noted     Trauma history: none noted   Legal issues: no    History of military service: no  Financial status: SSDI    Religious/cultural factors: none noted     Education/work history:      Leisure and recreation preferences:unknown  Describe coping skills:ineffectual   KRISTINA R BISHOP  03/04/2014

## 2014-03-04 NOTE — Behavioral Health Treatment Team (Signed)
Christy Henry did not attend Reflections group this evening.

## 2014-03-04 NOTE — Behavioral Health Treatment Team (Addendum)
0023--Patient asleep during initial rounds.  No stress noted.    0600--Patient slept 7 hours fifteen minutes.

## 2014-03-05 MED FILL — BENZTROPINE 1 MG TAB: 1 mg | ORAL | Qty: 1

## 2014-03-05 MED FILL — LORAZEPAM 0.5 MG TAB: 0.5 mg | ORAL | Qty: 1

## 2014-03-05 MED FILL — FLUPHENAZINE 5 MG TAB: 5 mg | ORAL | Qty: 2

## 2014-03-05 NOTE — Behavioral Health Treatment Team (Signed)
Pt refusus all scheduled meds.

## 2014-03-05 NOTE — Behavioral Health Treatment Team (Signed)
PSYCHIATRIC PROGRESS NOTE         Patient Name  Christy Henry   Date of Birth 1971-01-16   CSN [id number]   Medical Record Number  [id number]      Age  43 y.o.   PCP None   Admit date:  03/03/2014    Room Number  736/01  @ [facility]   Date of Service  03/05/2014            PSYCHOTHERAPY SESSION NOTE:  Length of psychotherapy session: 30 minutes  Main condition/diagnosis/issues treated during session today: bipolar disorder symptoms, formal thought disorder and medication compliance    Interpersonal relationship issues and psychodynamic conflicts explored. Supportive psychotherapy provided in regards to various ongoing psychosocial stressors (including some of the following):   pre-admission and current problems   Housing issues   Medical issues   Interpersonal conflicts   Stress of hospitalization              Worked on issues of denial & effects of substance dependency/use  Cognitive/Behavioral therapy provided  Reality-Oriented psychotherapy provided     Overall, patient is not progressing.    An extended energy and skill set was needed to engage pt in psychotherapy due to some of the following: resistiveness, complexity, negativity, confrontational nature, hostile behaviors, and/or severe abnormalities in thought processes/psychosis resulting in the loss of expressive/receptive language communication skills.                      E & M PROGRESS NOTE:         HISTORY       CC/HISTORY OF PRESENT ILLNESS/INTERVAL HISTORY:  (reviewed/updated 03/05/2014).  The patient, Christy Henry, is a 43 y.o. BLACK OR A[place] AMER[place] female with a past psychiatric history of schizoaffetcive d/o vs bipolar d/o, who presents at this time for an exacerbation of the principle diagnosis of Psychosis. Patient reports/evidences the following emotional symptoms:  agitation and psychotic behavior.  The above symptoms have been present for 2-3 days. These symptoms are of severe severity. The symptoms are  constant in nature.  Additional symptomatology include agitation, anger outbursts and difficulty sleeping . The patient's condition has been precipitated by traveling on the bus from [place] to [place] and "feeling tired" and psychosocial stressors (poor social supports ,, possible homelessness ).  Condition made worse by treatment noncompliance. UDS negative, BAL=0.        SIDE EFFECTS: (reviewed/updated 03/05/2014)  None reported or admitted to.  No noted toxicity with use of Depakote/Tegretol/lithium/Clozaril/TCAs   ALLERGIES:(reviewed/updated 03/05/2014)  No Known Allergies   MEDICATIONS PRIOR TO ADMISSION:(reviewed/updated 03/05/2014)  Prescriptions prior to admission   Medication Sig   ??? QUEtiapine (SEROQUEL) 100 mg tablet Take 100 mg by mouth nightly. Indications: SCHIZOPHRENIA      PAST MEDICAL HISTORY: Past medical history from the initial psychiatric evaluation has been reviewed (reviewed/updated 03/05/2014) with no additional updates (I asked patient and no additional past medical history provided). Past Medical History   Diagnosis Date   ??? Schizo affective schizophrenia (HCC)    History reviewed. No pertinent past surgical history.   SOCIAL HISTORY: Social history from the initial psychiatric evaluation has been reviewed (reviewed/updated 03/05/2014) with no additional updates (I asked patient and no additional social history provided). History     Social History   ??? Marital Status: MARRIED     Spouse Name: N/A     Number of Children: N/A   ??? Years of Education: N/A  Occupational History   ??? Not on file.     Social History Main Topics   ??? Smoking status: Not on file   ??? Smokeless tobacco: Not on file   ??? Alcohol Use: No   ??? Drug Use: Not on file   ??? Sexual Activity: Not on file     Other Topics Concern   ??? Not on file     Social History Narrative    43 year old [place] American female admitted ion a TDO from the bus terminal, where she was wandering in a confused state. She was unable to  tell examiners where she had come from nor any details about her history, She had paper work with her from [facility] in [place], , where she had been briefly admitted for "schizoaffective disorder". She had been discharged on Seroquel 100 mg po qhs and was given a bus ticket to return to [place]. She is unable to give a reason why she left the bus, which was in transit from [place], [place]- headed [place]. She was uncooperative with the interview and mental status exam, due to thought blocking, paranoia, and being internally preoccupind. She was committed at teh TDO hearing and forced medications were requested,as she refused to take any medications and was quite psychotic.       FAMILY HISTORY: Family history from the initial psychiatric evaluation has been reviewed (reviewed/updated 03/05/2014) with no additional updates (I asked patient and no additional family history provided). History reviewed. No pertinent family history.    REVIEW OF SYSTEMS: (reviewed/updated 03/05/2014)  Appetite:no change from normal   Sleep: decreased more than normal   All other Review of Systems:      Psychological ROS: positive for - hostility and irritability  Cardiovascular ROS: no chest pain or dyspnea on exertion         MENTAL STATUS EXAM & VITALS     MENTAL STATUS EXAM (MSE):    MSE FINDINGS ARE WITHIN NORMAL LIMITS (WNL) UNLESS OTHERWISE STATED BELOW.    Orientation disorganized   Vital Signs (BP,Pulse, Temp) See below (reviewed 03/05/2014); vital signs are WNL if not listed below.   Gait and Station Stable/steady, no ataxia   Abnormal Muscular Movements, Tone, and Behavior No EPS, no Tardive Dyskinesia, no abnormal muscular movements; wnl tone   Relations evasive, guarded and uncooperative   General Appearance:  disheveled, malodorous and older than stated age   Language No aphasia or dysarthria   Speech:  hyperverbal, loud and pressured   Thought Processes disorganized    Thought Associations flight of ideas and loose associations   Thought Content preoccupations   Suicidal Ideations no plan  and no intention   Homicidal Ideations no plan  and no intention   Mood:  hostile and irritable   Affect:  full range   Memory recent  adequate   Memory remote:  adequate   Concentration/Attention:  adequate   Fund of Knowledge average   Insight:  poor   Reliability poor   Judgment:  poor        VITALS:     Patient Vitals for the past 24 hrs:   Temp Pulse Resp BP   03/04/14 1900 - 70 18 148/70 mmHg            DATA     LABORATORY DATA:(reviewed/updated 03/05/2014)  No results found for this or any previous visit (from the past 24 hour(s)).  No results found for: VALF2, VALAC, VALP, VALPR, DS6, CRBAM, CRBAMP,  CARB2, XCRBAM  No results found for: LI, LIH, LITHM   RADIOLOGY REPORTS:(reviewed/updated 03/05/2014)  No results found.       MEDICATIONS     ALL MEDICATIONS:   Current Facility-Administered Medications   Medication Dose Route Frequency   ??? fluPHENAZine (PROLIXIN) tablet 10 mg  10 mg Oral BID   ??? benztropine (COGENTIN) tablet 1 mg  1 mg Oral BID   ??? LORazepam (ATIVAN) tablet 0.5 mg  0.5 mg Oral BID   ??? ziprasidone (GEODON) 20 mg in sterile water (preservative free) injection  20 mg IntraMUSCular Q4H PRN   ??? OLANZapine (ZyPREXA) tablet 5 mg  5 mg Oral Q6H PRN   ??? LORazepam (ATIVAN) injection 2 mg  2 mg IntraMUSCular Q4H PRN   ??? zolpidem (AMBIEN) tablet 5 mg  5 mg Oral QHS PRN   ??? ibuprofen (MOTRIN) tablet 400 mg  400 mg Oral Q4H PRN   ??? magnesium hydroxide (MILK OF MAGNESIA) oral suspension 30 mL  30 mL Oral DAILY PRN   ??? nicotine (NICODERM CQ) 21 mg/24 hr patch 1 Patch  1 Patch TransDERmal DAILY PRN      SCHEDULED MEDICATIONS:   Current Facility-Administered Medications   Medication Dose Route Frequency   ??? fluPHENAZine (PROLIXIN) tablet 10 mg  10 mg Oral BID   ??? benztropine (COGENTIN) tablet 1 mg  1 mg Oral BID   ??? LORazepam (ATIVAN) tablet 0.5 mg  0.5 mg Oral BID             ASSESSMENT & PLAN     The patient, Christy Henry, is a 43 y.o.  female who presents at this time for treatment of the following diagnoses: (reviewed/updated 03/05/2014)  Patient Active Hospital Problem List:   Psychosis (03/04/2014)    Assessment: disorganized thinking with formal thought disorder occurring in the context of labile affect, pressured speech and irritable mood. - Bipolar Disorder, Mania, VS. Schiazoaffective D/O    Plan:Start Depakote and prolixin*   Altered mental state (03/04/2014)    Assessment: flight of ideas, disorientation, loose associations, hyper alert. Not waxing/ waning, delirium ruled out.    Plan:Treat manic sxs as undrelying cause  Hypertension:  Assessment:Stable  Plan: antihypertensive medications  Hypercholesterolemia:  Assessment: Stable  Plan:lopid lowering medications and dietary management        In summary, Christy Henry presents with a severe exacerbation of the principal diagnosis, Psychosis, which is worsening/not improving/not stable .    Patient requires continued inpatient hospitalization for further stabilization. I will continue to coordinate the provision of individual, milieu, occupational, group, and substance abuse therapies to address target symptoms/diagnoses as deemed appropriate for the individual patient.     I will continue to monitor blood levels (Depakote, Tegretol, lithium, clozapine---a drug with a narrow therapeutic index= NTI) and associated labs for drug therapy implemented that require intense monitoring for toxicity as deemed appropriate base on current medication side effects and pharmacodynamically determined drug 1/2 lives.       A coordinated, multidisplinary treatment team round was conducted with the patient---[facility] . This team consists of the nurse, psychiatric unit pharmcist, social worker and Clinical research associatewriter.     The following regarding medications was addressed during rounds with patient:    the risks and benefits of the proposed medication. The patient was given the opportunity to ask questions. Informed consent given to the use of the above medications. Will continue to adjust psychiatric and non-psychiatric medications (see above "medication"  section and orders section for details) as deemed appropriate & based upon diagnoses and response to treatment.     I will continue to order blood tests/labs and diagnostic tests as deemed appropriate and review results as they become available (see orders for details and above listed lab/test results).    I will order psychiatric records from previous psych hospitals to further elucidate the nature of patient's psychopathology and review once available.    I will gather additional collateral information from friends, family and o/p treatment team to further elucidate the nature of patient's psychopathology and baselline level of psychiatric functioning.    Complete current electronic health record for patient has been reviewed today including consultant notes, ancillary staff notes, nurses and psychiatric tech notes    I certify that this patient's inpatient psychiatric hospital services furnished since the previous certification were, and continue to be, required for treatment that could reasonably be expected to improve the patient's condition, or for diagnostic study, and that the patient continues to need, on a daily basis, active treatment furnished directly by or requiring the supervision of inpatient psychiatric facility personnel. In addition, the hospital records show that services furnished were intensive treatment services, admission or related services, or equivalent services.    EXPECTED DISCHARGE DATE/DAY: 3-5 daysTBD     DISPOSITION: Home       Signed By:   Cheryll Cockayne, MD  03/05/2014          PSYCHIATRIC PROGRESS NOTE         Patient Name  Christy Henry   Date of Birth [birth year]   CSN [id number]   Medical Record Number  [id number]       Age  63 y.o.   PCP None   Admit date:  03/03/2014    Room Number  736/01  @ [facility]   Date of Service  03/05/2014            PSYCHOTHERAPY SESSION NOTE:  Length of psychotherapy session: 30 minutes  Main condition/diagnosis/issues treated during session today: mania, formal thought disorder    Interpersonal relationship issues and psychodynamic conflicts explored. Supportive psychotherapy provided in regards to various ongoing psychosocial stressors (including some of the following):   pre-admission and current problems   Housing issues   Occupational issues   Academic issues   Legal issues   Medical issues   Interpersonal conflicts   Stress of hospitalization              Worked on issues of denial & effects of substance dependency/use  Cognitive/Behavioral therapy provided  Reality-Oriented psychotherapy provided     Overall, patient is not progressing.    An extended energy and skill set was needed to engage pt in psychotherapy due to some of the following: resistiveness, complexity, negativity, confrontational nature, hostile behaviors, and/or severe abnormalities in thought processes/psychosis resulting in the loss of expressive/receptive language communication skills.                      E & M PROGRESS NOTE:         HISTORY       CC/HISTORY OF PRESENT ILLNESS/INTERVAL HISTORY:  (reviewed/updated 03/05/2014).  The patient, Christy Henry, is a 43 y.o. BLACK OR [place] female with a past psychiatric history of , who presents at this time for an exacerbation of the principle diagnosis of Psychosis. Patient reports/evidences the following emotional symptoms:  .  The above symptoms have been present for .  These symptoms are of  severity. The symptoms are constant intermittent/ fleeting in nature.  Additional symptomatology include  . The patient's condition has been precipitated by  and psychosocial stressors ( ).  Condition made worse by continued illicit  drug use and alcohol use as well as treatment noncompliance. UDS+ MJ, BAL=0.        SIDE EFFECTS: (reviewed/updated 03/05/2014)  None reported or admitted to.  No noted toxicity with use of Depakote/Tegretol/lithium/Clozaril/TCAs   ALLERGIES:(reviewed/updated 03/05/2014)  No Known Allergies   MEDICATIONS PRIOR TO ADMISSION:(reviewed/updated 03/05/2014)  Prescriptions prior to admission   Medication Sig   ??? QUEtiapine (SEROQUEL) 100 mg tablet Take 100 mg by mouth nightly. Indications: SCHIZOPHRENIA      PAST MEDICAL HISTORY: Past medical history from the initial psychiatric evaluation has been reviewed (reviewed/updated 03/05/2014) with no additional updates (I asked patient and no additional past medical history provided). Past Medical History   Diagnosis Date   ??? Schizo affective schizophrenia (HCC)    History reviewed. No pertinent past surgical history.   SOCIAL HISTORY: Social history from the initial psychiatric evaluation has been reviewed (reviewed/updated 03/05/2014) with no additional updates (I asked patient and no additional social history provided). History     Social History   ??? Marital Status: MARRIED     Spouse Name: N/A     Number of Children: N/A   ??? Years of Education: N/A     Occupational History   ??? Not on file.     Social History Main Topics   ??? Smoking status: Not on file   ??? Smokeless tobacco: Not on file   ??? Alcohol Use: No   ??? Drug Use: Not on file   ??? Sexual Activity: Not on file     Other Topics Concern   ??? Not on file     Social History Narrative    43 year old [place] American female admitted ion a TDO from the bus terminal, where she was wandering in a confused state. She was unable to tell examiners where she had come from nor any details about her history, She had paper work with her from [facility] in [place], [place], where she had been briefly admitted for "schizoaffective disorder". She had been discharged on Seroquel 100 mg po qhs and was given a bus ticket  to return to [place]. She is unable to give a reason why she left the bus, which was in transit from [place], [place]- headed [place]. She was uncooperative with the interview and mental status exam, due to thought blocking, paranoia, and being internally preoccupind. She was committed at teh TDO hearing and forced medications were requested,as she refused to take any medications and was quite psychotic.       FAMILY HISTORY: Family history from the initial psychiatric evaluation has been reviewed (reviewed/updated 03/05/2014) with no additional updates (I asked patient and no additional family history provided). History reviewed. No pertinent family history.    REVIEW OF SYSTEMS: (reviewed/updated 03/05/2014)  Appetite:   Sleep:    All other Review of Systems:               MENTAL STATUS EXAM & VITALS     MENTAL STATUS EXAM (MSE):    MSE FINDINGS ARE WITHIN NORMAL LIMITS (WNL) UNLESS OTHERWISE STATED BELOW.    Orientation    Vital Signs (BP,Pulse, Temp) See below (reviewed 03/05/2014); vital signs are WNL if not listed below.   Gait and Station Stable/steady, no ataxia  Abnormal Muscular Movements, Tone, and Behavior No EPS, no Tardive Dyskinesia, no abnormal muscular movements; wnl tone   Relations  General Appearance:     Language No aphasia or dysarthria   Speech:     Thought Processes logical, wnl rate of thoughts, good abstract reasoning and computation   Thought Associations    Thought Content    Suicidal Ideations    Homicidal Ideations    Mood:     Affect:     Memory recent   Memory remote:     Concentration/Attention:     Fund of Knowledge average   Insight:     Reliability    Judgment:          VITALS:     Patient Vitals for the past 24 hrs:   Temp Pulse Resp BP   03/04/14 1900 - 70 18 148/70 mmHg            DATA     LABORATORY DATA:(reviewed/updated 03/05/2014)  No results found for this or any previous visit (from the past 24 hour(s)).   No results found for: VALF2, VALAC, VALP, VALPR, DS6, CRBAM, CRBAMP, CARB2, XCRBAM  No results found for: LI, LIH, LITHM   RADIOLOGY REPORTS:(reviewed/updated 03/05/2014)  No results found.       MEDICATIONS     ALL MEDICATIONS:   Current Facility-Administered Medications   Medication Dose Route Frequency   ??? fluPHENAZine (PROLIXIN) tablet 10 mg  10 mg Oral BID   ??? benztropine (COGENTIN) tablet 1 mg  1 mg Oral BID   ??? LORazepam (ATIVAN) tablet 0.5 mg  0.5 mg Oral BID   ??? ziprasidone (GEODON) 20 mg in sterile water (preservative free) injection  20 mg IntraMUSCular Q4H PRN   ??? OLANZapine (ZyPREXA) tablet 5 mg  5 mg Oral Q6H PRN   ??? LORazepam (ATIVAN) injection 2 mg  2 mg IntraMUSCular Q4H PRN   ??? zolpidem (AMBIEN) tablet 5 mg  5 mg Oral QHS PRN   ??? ibuprofen (MOTRIN) tablet 400 mg  400 mg Oral Q4H PRN   ??? magnesium hydroxide (MILK OF MAGNESIA) oral suspension 30 mL  30 mL Oral DAILY PRN   ??? nicotine (NICODERM CQ) 21 mg/24 hr patch 1 Patch  1 Patch TransDERmal DAILY PRN      SCHEDULED MEDICATIONS:   Current Facility-Administered Medications   Medication Dose Route Frequency   ??? fluPHENAZine (PROLIXIN) tablet 10 mg  10 mg Oral BID   ??? benztropine (COGENTIN) tablet 1 mg  1 mg Oral BID   ??? LORazepam (ATIVAN) tablet 0.5 mg  0.5 mg Oral BID            ASSESSMENT & PLAN     The patient, Christy Henry, is a 43 y.o.  female who presents at this time for treatment of the following diagnoses: (reviewed/updated 03/05/2014)  Patient Active Hospital Problem List:   Psychosis (03/04/2014)    Assessment:     Plan:    Altered mental state (03/04/2014)    Assessment:     Plan:         In summary, Christy Henry presents with a severe exacerbation of the principal diagnosis, Psychosis, which is worsening/not improving/not stable improving.    Patient requires continued inpatient hospitalization for further stabilization. I will continue to coordinate the provision of individual,  milieu, occupational, group, and substance abuse therapies to address target symptoms/diagnoses as deemed appropriate for the individual patient.     I will continue to monitor blood  levels (Depakote, Tegretol, lithium, clozapine---a drug with a narrow therapeutic index= NTI) and associated labs for drug therapy implemented that require intense monitoring for toxicity as deemed appropriate base on current medication side effects and pharmacodynamically determined drug 1/2 lives.       A coordinated, multidisplinary treatment team round was conducted with the patient---[facility] . This team consists of the nurse, psychiatric unit pharmcist, social worker and Clinical research associatewriter.     The following regarding medications was addressed during rounds with patient:   the risks and benefits of the proposed medication. The patient was given the opportunity to ask questions. Informed consent given to the use of the above medications. Will continue to adjust psychiatric and non-psychiatric medications (see above "medication" section and orders section for details) as deemed appropriate & based upon diagnoses and response to treatment.     I will continue to order blood tests/labs and diagnostic tests as deemed appropriate and review results as they become available (see orders for details and above listed lab/test results).    I will order psychiatric records from previous psych hospitals to further elucidate the nature of patient's psychopathology and review once available.    I will gather additional collateral information from friends, family and o/p treatment team to further elucidate the nature of patient's psychopathology and baselline level of psychiatric functioning.    Complete current electronic health record for patient has been reviewed today including consultant notes, ancillary staff notes, nurses and psychiatric tech notes     I certify that this patient's inpatient psychiatric hospital services furnished since the previous certification were, and continue to be, required for treatment that could reasonably be expected to improve the patient's condition, or for diagnostic study, and that the patient continues to need, on a daily basis, active treatment furnished directly by or requiring the supervision of inpatient psychiatric facility personnel. In addition, the hospital records show that services furnished were intensive treatment services, admission or related services, or equivalent services.    EXPECTED DISCHARGE DATE/DAY: TBD     DISPOSITION: Home       Signed By:   Cheryll CockayneMaria C Lian Tanori, MD  03/05/2014

## 2014-03-05 NOTE — Behavioral Health Treatment Team (Signed)
Care Management Note:  Patient had a forced medication hearing this am and medications were forced.

## 2014-03-05 NOTE — Behavioral Health Treatment Team (Signed)
GROUP THERAPY PROGRESS NOTE    [place] [facility]     Group time: 15 minutes    Personal goal for participation: Go home    Goal orientation: personal    Group therapy participation: active    Therapeutic interventions reviewed and discussed: Writer listened attentively and explained unit rules.    Impression of participation: Patient participated actively.

## 2014-03-05 NOTE — Behavioral Health Treatment Team (Signed)
Pt did not attend evening group.

## 2014-03-05 NOTE — Behavioral Health Treatment Team (Addendum)
Patient appeared to sleep 8.75+ hours. Refused lab draw.

## 2014-03-05 NOTE — Behavioral Health Treatment Team (Signed)
Pressure Ulcer Documentation  (COMPLETE ONE LABEL PER PRESSURE ULCER)  For further information, please review corresponding Wound Care flowsheet.      Christy ShellErica Henry has:    No pressure ulcer noted and pressure ulcer prevention initiated.        Christy SparkAROLYN B FARMER-HALL, RN

## 2014-03-05 NOTE — Behavioral Health Treatment Team (Addendum)
Received patient awake in bathroom. NAD noted. Will continue to monitor patient for safety q15 minutes.    0025 Patient awake in hallway, prn ambien offered to patient, patient declined it.

## 2014-03-05 NOTE — Progress Notes (Signed)
Problem: Suicide/Homicide (Adult/Pediatric)  Goal: *STG: Remains safe in hospital  Outcome: Progressing Towards Goal  Denies si/hi contracting for safety on unit.

## 2014-03-05 NOTE — Behavioral Health Treatment Team (Addendum)
Pt. Up on unit for breakfast guarded  Disheveled appearance   Court ordered medication hearing held   Pt. Now ordered to take medications. On 15 min. Checks for safety. Pt.refusing labs  100 Dr. Mabeline CarasHaine in to see pt. Angry labile   Pressured speech "I don't know how you can think you can tell me what to do in [place] and have a judge see me, I'm [place]" medications reviewed informed now court ordered   Pt. Aware she has been listed as a missing person by police in [place] and we will inform our security to notify police she is safe and [facility]  Pt. Continues to refuse to discuss her treatment or physicans she sees at home. Pt. Denies suicidal ideation on 15 min. Checks for safety.

## 2014-03-06 MED ORDER — BENZTROPINE 1 MG TAB
1 mg | Freq: Two times a day (BID) | ORAL | Status: DC
Start: 2014-03-06 — End: 2014-03-09
  Administered 2014-03-07 – 2014-03-09 (×5): via ORAL

## 2014-03-06 MED ORDER — BENZTROPINE 1 MG/ML IJ SOLN
1 mg/mL | Freq: Two times a day (BID) | INTRAMUSCULAR | Status: DC
Start: 2014-03-06 — End: 2014-03-09

## 2014-03-06 MED ORDER — FLUPHENAZINE 5 MG TAB
5 mg | Freq: Two times a day (BID) | ORAL | Status: DC
Start: 2014-03-06 — End: 2014-03-12
  Administered 2014-03-07 – 2014-03-12 (×13): via ORAL

## 2014-03-06 MED ORDER — FLUPHENAZINE HCL 2.5 MG/ML IJ SOLN
2.5 mg/mL | Freq: Two times a day (BID) | INTRAMUSCULAR | Status: DC
Start: 2014-03-06 — End: 2014-03-12

## 2014-03-06 MED ORDER — LORAZEPAM 2 MG/ML IJ SOLN
2 mg/mL | Freq: Every day | INTRAMUSCULAR | Status: DC
Start: 2014-03-06 — End: 2014-03-07

## 2014-03-06 MED ORDER — DIVALPROEX 500 MG 24 HR TAB
500 mg | Freq: Every day | ORAL | Status: DC
Start: 2014-03-06 — End: 2014-03-07
  Administered 2014-03-06 – 2014-03-07 (×2): via ORAL

## 2014-03-06 MED FILL — BENZTROPINE 1 MG TAB: 1 mg | ORAL | Qty: 1

## 2014-03-06 MED FILL — FLUPHENAZINE 5 MG TAB: 5 mg | ORAL | Qty: 2

## 2014-03-06 MED FILL — DIVALPROEX 500 MG 24 HR TAB: 500 mg | ORAL | Qty: 2

## 2014-03-06 MED FILL — LORAZEPAM 0.5 MG TAB: 0.5 mg | ORAL | Qty: 1

## 2014-03-06 NOTE — Behavioral Health Treatment Team (Signed)
Care Management Note:    Patient refused to talk to treatment team today.   She continues to refuse medications.  She had a forced medication hearing yesterday.    SS will continue to follow for discharge needs.

## 2014-03-06 NOTE — Behavioral Health Treatment Team (Signed)
0600  Slept 2 1/4 hrs.  Poor sleep.  Bizarre.  Stares at staff.  Slow body movements.  Verbalizes no complaints.

## 2014-03-06 NOTE — Behavioral Health Treatment Team (Signed)
Pt received ambulating in hallway. Denies any SI/HI at this time. Compliant with milieu procedures. Continue to monitor and support.

## 2014-03-06 NOTE — Behavioral Health Treatment Team (Signed)
2315  Pt received on shift asleep in bed.  Continues on q15 min checks for safety.  Will continue to monitor and assess pt.

## 2014-03-06 NOTE — Behavioral Health Treatment Team (Addendum)
PSYCHIATRIC PROGRESS NOTE         Patient Name  Christy Henry   Date of Birth 03-Jul-1970   CSN [id number]   Medical Record Number  [id number]      Age  43 y.o.   PCP None   Admit date:  03/03/2014    Room Number  736/01  @ [facility]   Date of Service  03/06/2014            PSYCHOTHERAPY SESSION NOTE:  Length of psychotherapy session: 30 minutes  Main condition/diagnosis/issues treated during session today: bipolar disorder symptoms, formal thought disorder and medication compliance    Interpersonal relationship issues and psychodynamic conflicts explored. Supportive psychotherapy provided in regards to various ongoing psychosocial stressors (including some of the following):   pre-admission and current problems   Housing issues   Medical issues   Interpersonal conflicts   Stress of hospitalization              Worked on issues of denial & effects of substance dependency/use  Cognitive/Behavioral therapy provided  Reality-Oriented psychotherapy provided     Overall, patient is not progressing.    An extended energy and skill set was needed to engage pt in psychotherapy due to some of the following: resistiveness, complexity, negativity, confrontational nature, hostile behaviors, and/or severe abnormalities in thought processes/psychosis resulting in the loss of expressive/receptive language communication skills.                      E & M PROGRESS NOTE:         HISTORY       CC/HISTORY OF PRESENT ILLNESS/INTERVAL HISTORY:  (reviewed/updated 03/06/2014).  The patient, Christy Henry, is a 43 y.o. BLACK OR [place] AMER[place] female with a past psychiatric history of schizoaffetcive d/o vs bipolar d/o, who presents at this time for an exacerbation of the principle diagnosis of Psychosis. Patient reports/evidences the following emotional symptoms:  agitation and psychotic behavior.  The above symptoms have been present for 2-3 days. These symptoms are of severe severity. The symptoms are  constant in nature.  Additional symptomatology include agitation, anger outbursts and difficulty sleeping . The patient's condition has been precipitated by traveling on the bus from [place] to [place] and "feeling tired" and psychosocial stressors (poor social supports ,, possible homelessness ).  Condition made worse by treatment noncompliance. UDS negative, BAL=0.    03/06/14- refusing medications and very angry/ agitated. Needs direction to acre for hygiene. Internally preoccupied. No insight.        SIDE EFFECTS: (reviewed/updated 03/06/2014)  None reported or admitted to.  No noted toxicity with use of Depakote/Tegretol/lithium/Clozaril/TCAs   ALLERGIES:(reviewed/updated 03/06/2014)  No Known Allergies   MEDICATIONS PRIOR TO ADMISSION:(reviewed/updated 03/06/2014)  Prescriptions prior to admission   Medication Sig   ??? QUEtiapine (SEROQUEL) 100 mg tablet Take 100 mg by mouth nightly. Indications: SCHIZOPHRENIA      PAST MEDICAL HISTORY: Past medical history from the initial psychiatric evaluation has been reviewed (reviewed/updated 03/06/2014) with no additional updates (I asked patient and no additional past medical history provided).   Past Medical History   Diagnosis Date   ??? Schizo affective schizophrenia (HCC)    History reviewed. No pertinent past surgical history.   SOCIAL HISTORY: Social history from the initial psychiatric evaluation has been reviewed (reviewed/updated 03/06/2014) with no additional updates (I asked patient and no additional social history provided).   History     Social History   ??? Marital  Status: MARRIED     Spouse Name: N/A     Number of Children: N/A ??? Years of Education: N/A     Occupational History   ??? Not on file.     Social History Main Topics   ??? Smoking status: Not on file   ??? Smokeless tobacco: Not on file   ??? Alcohol Use: No   ??? Drug Use: Not on file   ??? Sexual Activity: Not on file     Other Topics Concern   ??? Not on file     Social History Narrative     43 year old [place] American female admitted ion a TDO from the bus terminal, where she was wandering in a confused state. She was unable to tell examiners where she had come from nor any details about her history, She had paper work with her from [facility] in [place], [place], where she had been briefly admitted for "schizoaffective disorder". She had been discharged on Seroquel 100 mg po qhs and was given a bus ticket to return to [place]. She is unable to give a reason why she left the bus, which was in transit from [place], [place]- headed [place]. She was uncooperative with the interview and mental status exam, due to thought blocking, paranoia, and being internally preoccupind. She was committed at teh TDO hearing and forced medications were requested,as she refused to take any medications and was quite psychotic.       FAMILY HISTORY: Family history from the initial psychiatric evaluation has been reviewed (reviewed/updated 03/06/2014) with no additional updates (I asked patient and no additional family history provided). History reviewed. No pertinent family history.    REVIEW OF SYSTEMS: (reviewed/updated 03/06/2014)  Appetite:no change from normal   Sleep: decreased more than normal   All other Review of Systems:      Psychological ROS: positive for - hostility and irritability  Cardiovascular ROS: no chest pain or dyspnea on exertion         MENTAL STATUS EXAM & VITALS     MENTAL STATUS EXAM (MSE):    MSE FINDINGS ARE WITHIN NORMAL LIMITS (WNL) UNLESS OTHERWISE STATED BELOW.    Orientation disorganized Vital Signs (BP,Pulse, Temp) See below (reviewed 03/06/2014); vital signs are WNL if not listed below.   Gait and Station Stable/steady, no ataxia   Abnormal Muscular Movements, Tone, and Behavior No EPS, no Tardive Dyskinesia, no abnormal muscular movements; wnl tone   Relations evasive, guarded and uncooperative   General Appearance:  disheveled, malodorous and older than stated age    Language No aphasia or dysarthria   Speech:  hyperverbal, loud and pressured   Thought Processes disorganized   Thought Associations flight of ideas and loose associations   Thought Content preoccupations   Suicidal Ideations no plan  and no intention   Homicidal Ideations no plan  and no intention   Mood:  hostile and irritable   Affect:  full range   Memory recent  adequate   Memory remote:  adequate   Concentration/Attention:  adequate   Fund of Knowledge average   Insight:  poor   Reliability poor   Judgment:  poor        VITALS:     Patient Vitals for the past 24 hrs:   Temp Pulse Resp BP   03/06/14 0845 - - 16 -            DATA     LABORATORY DATA:(reviewed/updated 03/06/2014)  No results found  for this or any previous visit (from the past 24 hour(s)).  No results found for: VALF2, VALAC, VALP, VALPR, DS6, CRBAM, CRBAMP, CARB2, XCRBAM  No results found for: LI, LIH, LITHM   RADIOLOGY REPORTS:(reviewed/updated 03/06/2014)  No results found.       MEDICATIONS     ALL MEDICATIONS:   Current Facility-Administered Medications   Medication Dose Route Frequency   ??? fluPHENAZine (PROLIXIN) tablet 10 mg+++++Court ordered medication+++++  10 mg Oral BID    Or   ??? fluPHENAZine (PROLIXIN) injection 5 mg  5 mg IntraMUSCular BID   ??? divalproex ER (DEPAKOTE ER) 24 hour tablet 1,000 mg+++++Court ordered medication+++++  1,000 mg Oral DAILY    Or   ??? LORazepam (ATIVAN) injection 1 mg  1 mg IntraMUSCular DAILY   ??? benztropine (COGENTIN) tablet 1 mg+++++Court ordered medication+++++  1 mg Oral BID    Or   ??? benztropine (COGENTIN) injection 1 mg  1 mg IntraMUSCular BID   ??? ziprasidone (GEODON) 20 mg in sterile water (preservative free) injection  20 mg IntraMUSCular Q4H PRN   ??? OLANZapine (ZyPREXA) tablet 5 mg  5 mg Oral Q6H PRN   ??? LORazepam (ATIVAN) injection 2 mg  2 mg IntraMUSCular Q4H PRN   ??? zolpidem (AMBIEN) tablet 5 mg  5 mg Oral QHS PRN   ??? ibuprofen (MOTRIN) tablet 400 mg  400 mg Oral Q4H PRN    ??? magnesium hydroxide (MILK OF MAGNESIA) oral suspension 30 mL  30 mL Oral DAILY PRN   ??? nicotine (NICODERM CQ) 21 mg/24 hr patch 1 Patch  1 Patch TransDERmal DAILY PRN      SCHEDULED MEDICATIONS:   Current Facility-Administered Medications   Medication Dose Route Frequency   ??? fluPHENAZine (PROLIXIN) tablet 10 mg+++++Court ordered medication+++++  10 mg Oral BID    Or   ??? fluPHENAZine (PROLIXIN) injection 5 mg  5 mg IntraMUSCular BID   ??? divalproex ER (DEPAKOTE ER) 24 hour tablet 1,000 mg+++++Court ordered medication+++++  1,000 mg Oral DAILY    Or   ??? LORazepam (ATIVAN) injection 1 mg  1 mg IntraMUSCular DAILY   ??? benztropine (COGENTIN) tablet 1 mg+++++Court ordered medication+++++  1 mg Oral BID    Or   ??? benztropine (COGENTIN) injection 1 mg  1 mg IntraMUSCular BID            ASSESSMENT & PLAN     The patient, Christy Henry, is a 43 y.o.  female who presents at this time for treatment of the following diagnoses: (reviewed/updated 03/06/2014)  Patient Active Hospital Problem List:   Psychosis (03/04/2014)    Assessment: disorganized thinking with formal thought disorder occurring in the context of labile affect, pressured speech and irritable mood. - Bipolar Disorder, Mania, VS. Schiazoaffective D/O    Plan:Start Depakote and prolixin*     Altered mental state (03/04/2014)    Assessment: flight of ideas, disorientation, loose associations, hyper alert. Not waxing/ waning, delirium ruled out.    Plan:Treat manic sxs as undrelying cause  Hypertension:  Assessment:Stable  Plan: antihypertensive medications  Hypercholesterolemia:  Assessment: Stable  Plan:lopid lowering medications and dietary management        In summary, Christy Henry presents with a severe exacerbation of the principal diagnosis, Psychosis, which is worsening/not improving/not stable .    Patient requires continued inpatient hospitalization for further stabilization. I will continue to coordinate the provision of individual,  milieu, occupational, group, and substance abuse therapies to address target symptoms/diagnoses as deemed appropriate  for the individual patient.     I will continue to monitor blood levels (Depakote, Tegretol, lithium, clozapine---a drug with a narrow therapeutic index= NTI) and associated labs for drug therapy implemented that require intense monitoring for toxicity as deemed appropriate base on current medication side effects and pharmacodynamically determined drug 1/2 lives.       A coordinated, multidisplinary treatment team round was conducted with the patient---this is done daily here at [facility] . This team consists of the nurse, psychiatric unit pharmcist, social worker and Clinical research associate.     The following regarding medications was addressed during rounds with patient:   the risks and benefits of the proposed medication. The patient was given the opportunity to ask questions. Informed consent given to the use of the above medications. Will continue to adjust psychiatric and non-psychiatric medications (see above "medication" section and orders section for details) as deemed appropriate & based upon diagnoses and response to treatment.     I will continue to order blood tests/labs and diagnostic tests as deemed appropriate and review results as they become available (see orders for details and above listed lab/test results).    I will order psychiatric records from previous psych hospitals to further elucidate the nature of patient's psychopathology and review once available.    I will gather additional collateral information from friends, family and o/p treatment team to further elucidate the nature of patient's psychopathology and baselline level of psychiatric functioning.    Complete current electronic health record for patient has been reviewed today including consultant notes, ancillary staff notes, nurses and psychiatric tech notes     I certify that this patient's inpatient psychiatric hospital services furnished since the previous certification were, and continue to be, required for treatment that could reasonably be expected to improve the patient's condition, or for diagnostic study, and that the patient continues to need, on a daily basis, active treatment furnished directly by or requiring the supervision of inpatient psychiatric facility personnel. In addition, the hospital records show that services furnished were intensive treatment services, admission or related services, or equivalent services.    EXPECTED DISCHARGE DATE/DAY: 3-5 daysTBD     DISPOSITION: Home       Signed By:   Cheryll Cockayne, MD  03/06/2014          PSYCHIATRIC PROGRESS NOTE         Patient Name  Christy Henry   Date of Birth 08-Nov-1970   CSN [id number]   Medical Record Number  [id number]      Age  42 y.o.   PCP None   Admit date:  03/03/2014    Room Number  736/01  @ [facility]   Date of Service  03/06/2014            PSYCHOTHERAPY SESSION NOTE:  Length of psychotherapy session: 30 minutes  Main condition/diagnosis/issues treated during session today: mania, formal thought disorder    Interpersonal relationship issues and psychodynamic conflicts explored. Supportive psychotherapy provided in regards to various ongoing psychosocial stressors (including some of the following):   pre-admission and current problems   Housing issues   Occupational issues   Academic issues   Legal issues   Medical issues   Interpersonal conflicts   Stress of hospitalization              Worked on issues of denial & effects of substance dependency/use  Cognitive/Behavioral therapy provided  Reality-Oriented psychotherapy provided     Overall, patient  is not progressing.    An extended energy and skill set was needed to engage pt in psychotherapy due to some of the following: resistiveness, complexity, negativity, confrontational nature, hostile behaviors, and/or severe abnormalities in  thought processes/psychosis resulting in the loss of expressive/receptive language communication skills.                      E & M PROGRESS NOTE:         HISTORY       CC/HISTORY OF PRESENT ILLNESS/INTERVAL HISTORY:  (reviewed/updated 03/06/2014).  The patient, Christy Henry, is a 43 y.o. BLACK OR [place] female with a past psychiatric history of , who presents at this time for an exacerbation of the principle diagnosis of Psychosis. Patient reports/evidences the following emotional symptoms:  .  The above symptoms have been present for . These symptoms are of  severity. The symptoms are constant intermittent/ fleeting in nature.  Additional symptomatology include  . The patient's condition has been precipitated by  and psychosocial stressors ( ).  Condition made worse by continued illicit drug use and alcohol use as well as treatment noncompliance. UDS+ MJ, BAL=0.        SIDE EFFECTS: (reviewed/updated 03/06/2014)  None reported or admitted to.  No noted toxicity with use of Depakote/Tegretol/lithium/Clozaril/TCAs   ALLERGIES:(reviewed/updated 03/06/2014)  No Known Allergies   MEDICATIONS PRIOR TO ADMISSION:(reviewed/updated 03/06/2014)  Prescriptions prior to admission   Medication Sig   ??? QUEtiapine (SEROQUEL) 100 mg tablet Take 100 mg by mouth nightly. Indications: SCHIZOPHRENIA      PAST MEDICAL HISTORY: Past medical history from the initial psychiatric evaluation has been reviewed (reviewed/updated 03/06/2014) with no additional updates (I asked patient and no additional past medical history provided).   Past Medical History   Diagnosis Date   ??? Schizo affective schizophrenia (HCC)    History reviewed. No pertinent past surgical history.   SOCIAL HISTORY: Social history from the initial psychiatric evaluation has been reviewed (reviewed/updated 03/06/2014) with no additional updates (I asked patient and no additional social history provided).   History     Social History   ??? Marital Status: MARRIED      Spouse Name: N/A     Number of Children: N/A   ??? Years of Education: N/A     Occupational History   ??? Not on file.     Social History Main Topics   ??? Smoking status: Not on file   ??? Smokeless tobacco: Not on file   ??? Alcohol Use: No   ??? Drug Use: Not on file   ??? Sexual Activity: Not on file     Other Topics Concern   ??? Not on file     Social History Narrative    43 year old [place] American female admitted ion a TDO from the bus terminal, where she was wandering in a confused state. She was unable to tell examiners where she had come from nor any details about her history, She had paper work with her from [facility] in [place], [place], where she had been briefly admitted for "schizoaffective disorder". She had been discharged on Seroquel 100 mg po qhs and was given a bus ticket to return to [place]. She is unable to give a reason why she left the bus, which was in transit from [place], [place]- headed [place]. She was uncooperative with the interview and mental status exam, due to thought blocking, paranoia, and being internally preoccupind. She was committed  at teh TDO hearing and forced medications were requested,as she refused to take any medications and was quite psychotic.       FAMILY HISTORY: Family history from the initial psychiatric evaluation has been reviewed (reviewed/updated 03/06/2014) with no additional updates (I asked patient and no additional family history provided). History reviewed. No pertinent family history.    REVIEW OF SYSTEMS: (reviewed/updated 03/06/2014)  Appetite:   Sleep:    All other Review of Systems:               MENTAL STATUS EXAM & VITALS     MENTAL STATUS EXAM (MSE):    MSE FINDINGS ARE WITHIN NORMAL LIMITS (WNL) UNLESS OTHERWISE STATED BELOW.    Orientation    Vital Signs (BP,Pulse, Temp) See below (reviewed 03/06/2014); vital signs are WNL if not listed below.   Gait and Station Stable/steady, no ataxia    Abnormal Muscular Movements, Tone, and Behavior No EPS, no Tardive Dyskinesia, no abnormal muscular movements; wnl tone   Relations    General Appearance:     Language No aphasia or dysarthria   Speech:     Thought Processes logical, wnl rate of thoughts, good abstract reasoning and computation   Thought Associations    Thought Content    Suicidal Ideations    Homicidal Ideations    Mood:     Affect:     Memory recent     Memory remote:     Concentration/Attention:     Fund of Knowledge average   Insight:     Reliability    Judgment:          VITALS:     Patient Vitals for the past 24 hrs:   Temp Pulse Resp BP   03/06/14 0845 - - 16 -          DATA     LABORATORY DATA:(reviewed/updated 03/06/2014)  No results found for this or any previous visit (from the past 24 hour(s)).  No results found for: VALF2, VALAC, VALP, VALPR, DS6, CRBAM, CRBAMP, CARB2, XCRBAM  No results found for: LI, LIH, LITHM   RADIOLOGY REPORTS:(reviewed/updated 03/06/2014)  No results found.       MEDICATIONS     ALL MEDICATIONS:   Current Facility-Administered Medications   Medication Dose Route Frequency   ??? fluPHENAZine (PROLIXIN) tablet 10 mg+++++Court ordered medication+++++  10 mg Oral BID    Or   ??? fluPHENAZine (PROLIXIN) injection 5 mg  5 mg IntraMUSCular BID   ??? divalproex ER (DEPAKOTE ER) 24 hour tablet 1,000 mg+++++Court ordered medication+++++  1,000 mg Oral DAILY    Or   ??? LORazepam (ATIVAN) injection 1 mg  1 mg IntraMUSCular DAILY   ??? benztropine (COGENTIN) tablet 1 mg+++++Court ordered medication+++++  1 mg Oral BID    Or   ??? benztropine (COGENTIN) injection 1 mg  1 mg IntraMUSCular BID   ??? ziprasidone (GEODON) 20 mg in sterile water (preservative free) injection  20 mg IntraMUSCular Q4H PRN   ??? OLANZapine (ZyPREXA) tablet 5 mg  5 mg Oral Q6H PRN   ??? LORazepam (ATIVAN) injection 2 mg  2 mg IntraMUSCular Q4H PRN   ??? zolpidem (AMBIEN) tablet 5 mg  5 mg Oral QHS PRN   ??? ibuprofen (MOTRIN) tablet 400 mg  400 mg Oral Q4H PRN    ??? magnesium hydroxide (MILK OF MAGNESIA) oral suspension 30 mL  30 mL Oral DAILY PRN   ??? nicotine (NICODERM CQ) 21 mg/24 hr patch 1 Patch  1 Patch TransDERmal DAILY PRN      SCHEDULED MEDICATIONS:   Current Facility-Administered Medications   Medication Dose Route Frequency   ??? fluPHENAZine (PROLIXIN) tablet 10 mg+++++Court ordered medication+++++  10 mg Oral BID    Or   ??? fluPHENAZine (PROLIXIN) injection 5 mg  5 mg IntraMUSCular BID   ??? divalproex ER (DEPAKOTE ER) 24 hour tablet 1,000 mg+++++Court ordered medication+++++  1,000 mg Oral DAILY    Or   ??? LORazepam (ATIVAN) injection 1 mg  1 mg IntraMUSCular DAILY   ??? benztropine (COGENTIN) tablet 1 mg+++++Court ordered medication+++++  1 mg Oral BID    Or   ??? benztropine (COGENTIN) injection 1 mg  1 mg IntraMUSCular BID            ASSESSMENT & PLAN     The patient, Christy Henry, is a 43 y.o.  female who presents at this time for treatment of the following diagnoses: (reviewed/updated 03/06/2014)  Patient Active Hospital Problem List:   Psychosis (03/04/2014)    Assessment:     Plan:    Altered mental state (03/04/2014)    Assessment:     Plan:         In summary, Christy Henry presents with a severe exacerbation of the principal diagnosis, Psychosis, which is worsening/not improving/not stable improving.    Patient requires continued inpatient hospitalization for further stabilization. I will continue to coordinate the provision of individual, milieu, occupational, group, and substance abuse therapies to address target symptoms/diagnoses as deemed appropriate for the individual patient.     I will continue to monitor blood levels (Depakote, Tegretol, lithium, clozapine---a drug with a narrow therapeutic index= NTI) and associated labs for drug therapy implemented that require intense monitoring for toxicity as deemed appropriate base on current medication side effects and pharmacodynamically determined drug 1/2 lives.        A coordinated, multidisplinary treatment team round was conducted with the patient---[facility] . This team consists of the nurse, psychiatric unit pharmcist, social worker and Clinical research associatewriter.     The following regarding medications was addressed during rounds with patient:   the risks and benefits of the proposed medication. The patient was given the opportunity to ask questions. Informed consent given to the use of the above medications. Will continue to adjust psychiatric and non-psychiatric medications (see above "medication" section and orders section for details) as deemed appropriate & based upon diagnoses and response to treatment.     I will continue to order blood tests/labs and diagnostic tests as deemed appropriate and review results as they become available (see orders for details and above listed lab/test results).    I will order psychiatric records from previous psych hospitals to further elucidate the nature of patient's psychopathology and review once available.    I will gather additional collateral information from friends, family and o/p treatment team to further elucidate the nature of patient's psychopathology and baselline level of psychiatric functioning.    Complete current electronic health record for patient has been reviewed today including consultant notes, ancillary staff notes, nurses and psychiatric tech notes    I certify that this patient's inpatient psychiatric hospital services furnished since the previous certification were, and continue to be, required for treatment that could reasonably be expected to improve the patient's condition, or for diagnostic study, and that the patient continues to need, on a daily basis, active treatment furnished directly by or requiring the supervision of inpatient psychiatric  facility personnel. In addition, the hospital records show that services furnished  were intensive treatment services, admission or related services, or equivalent services.    EXPECTED DISCHARGE DATE/DAY: TBD     DISPOSITION: Home       Signed By:   Cheryll Cockayne, MD  03/06/2014        Srting court ordered medications. 03/06/14

## 2014-03-06 NOTE — Progress Notes (Signed)
Problem: Altered Thought Process (Adult/Pediatric)  Goal: *STG: Remains safe in hospital  Outcome: Progressing Towards Goal  No acute distress noted.

## 2014-03-06 NOTE — Behavioral Health Treatment Team (Signed)
Did not attend reflections group.

## 2014-03-06 NOTE — Progress Notes (Signed)
The documentation for this period is being entered following the guidelines as defined in the [facility] downtime policy by PATRICIA L HARDWICKE, RN.      [id number]

## 2014-03-06 NOTE — Behavioral Health Treatment Team (Signed)
Did not attend Community Group.

## 2014-03-06 NOTE — Progress Notes (Signed)
Problem: Suicide/Homicide (Adult/Pediatric)  Goal: *STG: Remains safe in hospital  Denies SI/HI contracting for safety on unit.

## 2014-03-06 NOTE — Progress Notes (Signed)
GROUP THERAPY PROGRESS NOTE    Christy Henry is participating in Process Group.     Group time: 45 minutes    Group therapy participation:         Did not attend group today.

## 2014-03-06 NOTE — Behavioral Health Treatment Team (Addendum)
Pt. Up on unit for breakfast  Angry edge  Refused vital signs   Medication  Compliant with encouragement  On court ordered medications  Pt. Denies suicidal ideation  Attends select groups   Pt. On 15 min. Checks for safety.

## 2014-03-07 MED ORDER — DIVALPROEX 500 MG 24 HR TAB
500 mg | Freq: Every evening | ORAL | Status: DC
Start: 2014-03-07 — End: 2014-03-12
  Administered 2014-03-08 – 2014-03-12 (×6): via ORAL

## 2014-03-07 MED ORDER — LORAZEPAM 2 MG/ML IJ SOLN
2 mg/mL | Freq: Every evening | INTRAMUSCULAR | Status: DC
Start: 2014-03-07 — End: 2014-03-12

## 2014-03-07 MED FILL — ZOLPIDEM 5 MG TAB: 5 mg | ORAL | Qty: 1

## 2014-03-07 MED FILL — BENZTROPINE 1 MG TAB: 1 mg | ORAL | Qty: 1

## 2014-03-07 MED FILL — FLUPHENAZINE 5 MG TAB: 5 mg | ORAL | Qty: 2

## 2014-03-07 MED FILL — DIVALPROEX 500 MG 24 HR TAB: 500 mg | ORAL | Qty: 2

## 2014-03-07 NOTE — Progress Notes (Signed)
Problem: Interdisciplinary Rounds  Goal: Interdisciplinary Rounds  Outcome: Not Progressing Towards Goal  SW INTERDISCIPLINARY ROUNDS    NAME:Christy Henry  DOB:   07/02/1970  MRN:   [id number]      Patient presentation including presence/absence SI/HI, psychosis, ability to perform ADLs:Patient refused to participate in treatment team.  She has forced medications.      Concerns expressed by patient:refuses to talk to treatment     Family involvement:no family involvement    Level of group participation:poor     Need for transfer:no    Resource needs:after care    Strengths:resourceful    Limitations:no insight , poor judgement     Other:

## 2014-03-07 NOTE — Behavioral Health Treatment Team (Signed)
Pt refuses Cogentin.

## 2014-03-07 NOTE — Behavioral Health Treatment Team (Signed)
0600--Patient slept 7 hours thirty minutes.

## 2014-03-07 NOTE — Behavioral Health Treatment Team (Signed)
2315  Pt received on shift asleep in bed.  Continues on q15 min checks for safety.  Will continue to monitor and assess pt.

## 2014-03-07 NOTE — Behavioral Health Treatment Team (Signed)
Christy Henry did not attend Reflections group this evening.

## 2014-03-07 NOTE — Behavioral Health Treatment Team (Signed)
PSYCHIATRIC PROGRESS NOTE         Patient Name  Christy Henry   Date of Birth February 02, 1971   CSN [id number]   Medical Record Number  [id number]      Age  43 y.o.   PCP None   Admit date:  03/03/2014    Room Number  736/01  @ [facility]   Date of Service  03/07/2014            PSYCHOTHERAPY SESSION NOTE:  Length of psychotherapy session: 30 minutes  Main condition/diagnosis/issues treated during session today: bipolar disorder symptoms, formal thought disorder and medication compliance    Interpersonal relationship issues and psychodynamic conflicts explored. Supportive psychotherapy provided in regards to various ongoing psychosocial stressors (including some of the following):   pre-admission and current problems   Housing issues   Medical issues   Interpersonal conflicts   Stress of hospitalization              Worked on issues of denial & effects of substance dependency/use  Cognitive/Behavioral therapy provided  Reality-Oriented psychotherapy provided     Overall, patient is not progressing.    An extended energy and skill set was needed to engage pt in psychotherapy due to some of the following: resistiveness, complexity, negativity, confrontational nature, hostile behaviors, and/or severe abnormalities in thought processes/psychosis resulting in the loss of expressive/receptive language communication skills.                      E & M PROGRESS NOTE:         HISTORY       CC/HISTORY OF PRESENT ILLNESS/INTERVAL HISTORY:  (reviewed/updated 03/07/2014).  The patient, Christy Henry, is a 43 y.o. BLACK OR AFR[place] AMER[place] female with a past psychiatric history of schizoaffetcive d/o vs bipolar d/o, who presents at this time for an exacerbation of the principle diagnosis of Psychosis. Patient reports/evidences the following emotional symptoms:  agitation and psychotic behavior.  The above symptoms have been present for 2-3 days. These symptoms are of severe severity. The symptoms are  constant in nature.  Additional symptomatology include agitation, anger outbursts and difficulty sleeping . The patient's condition has been precipitated by traveling on the bus from [place] to [place] and "feeling tired" and psychosocial stressors (poor social supports ,, possible homelessness ).  Condition made worse by treatment noncompliance. UDS negative, BAL=0.    03/06/14- refusing medications and very angry/ agitated. Needs direction to acre for hygiene. Internally preoccupied. No insight.        SIDE EFFECTS: (reviewed/updated 03/07/2014)  None reported or admitted to.  No noted toxicity with use of Depakote/Tegretol/lithium/Clozaril/TCAs   ALLERGIES:(reviewed/updated 03/07/2014)  No Known Allergies   MEDICATIONS PRIOR TO ADMISSION:(reviewed/updated 03/07/2014)  Prescriptions prior to admission   Medication Sig   ??? QUEtiapine (SEROQUEL) 100 mg tablet Take 100 mg by mouth nightly. Indications: SCHIZOPHRENIA      PAST MEDICAL HISTORY: Past medical history from the initial psychiatric evaluation has been reviewed (reviewed/updated 03/07/2014) with no additional updates (I asked patient and no additional past medical history provided).   Past Medical History   Diagnosis Date   ??? Schizo affective schizophrenia (HCC)    History reviewed. No pertinent past surgical history.   SOCIAL HISTORY: Social history from the initial psychiatric evaluation has been reviewed (reviewed/updated 03/07/2014) with no additional updates (I asked patient and no additional social history provided).   History     Social History   ??? Marital  Status: MARRIED     Spouse Name: N/A     Number of Children: N/A   ??? Years of Education: N/A     Occupational History   ??? Not on file.     Social History Main Topics   ??? Smoking status: Not on file   ??? Smokeless tobacco: Not on file   ??? Alcohol Use: No   ??? Drug Use: Not on file   ??? Sexual Activity: Not on file     Other Topics Concern   ??? Not on file     Social History Narrative     43 year old [place] female admitted ion a TDO from the bus terminal, where she was wandering in a confused state. She was unable to tell examiners where she had come from nor any details about her history, She had paper work with her from [facility] in [place], [place], where she had been briefly admitted for "schizoaffective disorder". She had been discharged on Seroquel 100 mg po qhs and was given a bus ticket to return to [place]. She is unable to give a reason why she left the bus, which was in transit from [place]- headed [place]. She was uncooperative with the interview and mental status exam, due to thought blocking, paranoia, and being internally preoccupind. She was committed at teh TDO hearing and forced medications were requested,as she refused to take any medications and was quite psychotic.       FAMILY HISTORY: Family history from the initial psychiatric evaluation has been reviewed (reviewed/updated 03/07/2014) with no additional updates (I asked patient and no additional family history provided). History reviewed. No pertinent family history.    REVIEW OF SYSTEMS: (reviewed/updated 03/07/2014)  Appetite:no change from normal   Sleep: decreased more than normal   All other Review of Systems:      Psychological ROS: positive for - hostility and irritability  Cardiovascular ROS: no chest pain or dyspnea on exertion         MENTAL STATUS EXAM & VITALS     MENTAL STATUS EXAM (MSE):    MSE FINDINGS ARE WITHIN NORMAL LIMITS (WNL) UNLESS OTHERWISE STATED BELOW.    Orientation disorganized   Vital Signs (BP,Pulse, Temp) See below (reviewed 03/07/2014); vital signs are WNL if not listed below.   Gait and Station Stable/steady, no ataxia   Abnormal Muscular Movements, Tone, and Behavior No EPS, no Tardive Dyskinesia, no abnormal muscular movements; wnl tone   Relations evasive, guarded and uncooperative   General Appearance:  disheveled, malodorous and older than stated age    Language No aphasia or dysarthria   Speech:  hyperverbal, loud and pressured   Thought Processes disorganized   Thought Associations flight of ideas and loose associations   Thought Content preoccupations   Suicidal Ideations no plan  and no intention   Homicidal Ideations no plan  and no intention   Mood:  hostile and irritable   Affect:  full range   Memory recent  adequate   Memory remote:  adequate   Concentration/Attention:  adequate   Fund of Knowledge average   Insight:  poor   Reliability poor   Judgment:  poor        VITALS:     Patient Vitals for the past 24 hrs:   Temp Pulse Resp BP SpO2   03/07/14 1845 99 ??F (37.2 ??C) 100 18 146/82 mmHg 96 %            DATA  LABORATORY DATA:(reviewed/updated 03/07/2014)  No results found for this or any previous visit (from the past 24 hour(s)).  No results found for: VALF2, VALAC, VALP, VALPR, DS6, CRBAM, CRBAMP, CARB2, XCRBAM  No results found for: LI, LIH, LITHM   RADIOLOGY REPORTS:(reviewed/updated 03/07/2014)  No results found.       MEDICATIONS     ALL MEDICATIONS:   Current Facility-Administered Medications   Medication Dose Route Frequency   ??? divalproex ER (DEPAKOTE ER) 24 hour tablet 1000mg  ++++++++Court ordered medication+++++++  1,000 mg Oral QHS    Or   ??? LORazepam (ATIVAN) injection 1mg  +++++++ Court Ordered Medication +++++++++  1 mg IntraMUSCular QHS   ??? fluPHENAZine (PROLIXIN) tablet 10 mg+++++Court ordered medication+++++  10 mg Oral BID    Or   ??? fluPHENAZine (PROLIXIN) injection 5 mg  5 mg IntraMUSCular BID   ??? benztropine (COGENTIN) tablet 1 mg+++++Court ordered medication+++++  1 mg Oral BID    Or   ??? benztropine (COGENTIN) injection 1 mg  1 mg IntraMUSCular BID   ??? ziprasidone (GEODON) 20 mg in sterile water (preservative free) injection  20 mg IntraMUSCular Q4H PRN   ??? OLANZapine (ZyPREXA) tablet 5 mg  5 mg Oral Q6H PRN   ??? LORazepam (ATIVAN) injection 2 mg  2 mg IntraMUSCular Q4H PRN   ??? zolpidem (AMBIEN) tablet 5 mg  5 mg Oral QHS PRN    ??? ibuprofen (MOTRIN) tablet 400 mg  400 mg Oral Q4H PRN   ??? magnesium hydroxide (MILK OF MAGNESIA) oral suspension 30 mL  30 mL Oral DAILY PRN   ??? nicotine (NICODERM CQ) 21 mg/24 hr patch 1 Patch  1 Patch TransDERmal DAILY PRN      SCHEDULED MEDICATIONS:   Current Facility-Administered Medications   Medication Dose Route Frequency   ??? divalproex ER (DEPAKOTE ER) 24 hour tablet 1000mg  ++++++++Court ordered medication+++++++  1,000 mg Oral QHS    Or   ??? LORazepam (ATIVAN) injection 1mg  +++++++ Court Ordered Medication +++++++++  1 mg IntraMUSCular QHS ??? fluPHENAZine (PROLIXIN) tablet 10 mg+++++Court ordered medication+++++  10 mg Oral BID    Or   ??? fluPHENAZine (PROLIXIN) injection 5 mg  5 mg IntraMUSCular BID   ??? benztropine (COGENTIN) tablet 1 mg+++++Court ordered medication+++++  1 mg Oral BID    Or   ??? benztropine (COGENTIN) injection 1 mg  1 mg IntraMUSCular BID            ASSESSMENT & PLAN     The patient, Christy Henry, is a 43 y.o.  female who presents at this time for treatment of the following diagnoses: (reviewed/updated 03/07/2014)  Patient Active Hospital Problem List:   Psychosis (03/04/2014)    Assessment: disorganized thinking with formal thought disorder occurring in the context of labile affect, pressured speech and irritable mood. - Bipolar Disorder, Mania, VS. Schiazoaffective D/O    Plan:Start Depakote and prolixin*     Altered mental state (03/04/2014)    Assessment: flight of ideas, disorientation, loose associations, hyper alert. Not waxing/ waning, delirium ruled out.    Plan:Treat manic sxs as undrelying cause  Hypertension:  Assessment:Stable  Plan: antihypertensive medications  Hypercholesterolemia:  Assessment: Stable  Plan:lopid lowering medications and dietary management        In summary, Christy Henry presents with a severe exacerbation of the principal diagnosis, Psychosis, which is worsening/not improving/not stable .    Patient requires continued inpatient hospitalization for further  stabilization. I will continue to coordinate the provision of individual, milieu,  occupational, group, and substance abuse therapies to address target symptoms/diagnoses as deemed appropriate for the individual patient.     I will continue to monitor blood levels (Depakote, Tegretol, lithium, clozapine---a drug with a narrow therapeutic index= NTI) and associated labs for drug therapy implemented that require intense monitoring for toxicity as deemed appropriate base on current medication side effects and pharmacodynamically determined drug 1/2 lives.       A coordinated, multidisplinary treatment team round was conducted with the patient---this is done daily here at [facility] . This team consists of the nurse, psychiatric unit pharmcist, social worker and Clinical research associate.     The following regarding medications was addressed during rounds with patient:   the risks and benefits of the proposed medication. The patient was given the opportunity to ask questions. Informed consent given to the use of the above medications. Will continue to adjust psychiatric and non-psychiatric medications (see above "medication" section and orders section for details) as deemed appropriate & based upon diagnoses and response to treatment.     I will continue to order blood tests/labs and diagnostic tests as deemed appropriate and review results as they become available (see orders for details and above listed lab/test results).    I will order psychiatric records from previous psych hospitals to further elucidate the nature of patient's psychopathology and review once available.    I will gather additional collateral information from friends, family and o/p treatment team to further elucidate the nature of patient's psychopathology and baselline level of psychiatric functioning.    Complete current electronic health record for patient has been reviewed today including consultant notes, ancillary staff notes, nurses and  psychiatric tech notes    I certify that this patient's inpatient psychiatric hospital services furnished since the previous certification were, and continue to be, required for treatment that could reasonably be expected to improve the patient's condition, or for diagnostic study, and that the patient continues to need, on a daily basis, active treatment furnished directly by or requiring the supervision of inpatient psychiatric facility personnel. In addition, the hospital records show that services furnished were intensive treatment services, admission or related services, or equivalent services.    EXPECTED DISCHARGE DATE/DAY: 3-5 daysTBD     DISPOSITION: Home       Signed By:   Cheryll Cockayne, MD  03/07/2014          PSYCHIATRIC PROGRESS NOTE         Patient Name  Christy Henry   Date of Birth 1970-10-07   CSN [id number]   Medical Record Number  [id number]      Age  66 y.o.   PCP None   Admit date:  03/03/2014    Room Number  736/01  @ [facility]   Date of Service  03/07/2014            PSYCHOTHERAPY SESSION NOTE:  Length of psychotherapy session: 30 minutes  Main condition/diagnosis/issues treated during session today: mania, formal thought disorder    Interpersonal relationship issues and psychodynamic conflicts explored. Supportive psychotherapy provided in regards to various ongoing psychosocial stressors (including some of the following):   pre-admission and current problems   Housing issues   Occupational issues   Academic issues   Legal issues   Medical issues   Interpersonal conflicts   Stress of hospitalization              Worked on issues of denial & effects of substance dependency/use  Cognitive/Behavioral therapy provided  Reality-Oriented psychotherapy provided     Overall, patient is not progressing.    An extended energy and skill set was needed to engage pt in psychotherapy due to some of the following: resistiveness, complexity, negativity,  confrontational nature, hostile behaviors, and/or severe abnormalities in thought processes/psychosis resulting in the loss of expressive/receptive language communication skills.                      E & M PROGRESS NOTE:         HISTORY       CC/HISTORY OF PRESENT ILLNESS/INTERVAL HISTORY:  (reviewed/updated 03/07/2014).  The patient, Christy Henry, is a 43 y.o. BLACK OR A[place] AMER[place] female with a past psychiatric history of , who presents at this time for an exacerbation of the principle diagnosis of Psychosis. Patient reports/evidences the following emotional symptoms:  .  The above symptoms have been present for . These symptoms are of  severity. The symptoms are constant intermittent/ fleeting in nature.  Additional symptomatology include  . The patient's condition has been precipitated by  and psychosocial stressors ( ).  Condition made worse by alcohol use as well as treatment noncompliance. UDS+ MJ, BAL=0.    03/07/14- continues to refuse medications. Openly hostile and ignoring her personal hygiene. Pranoid and internally preoccupied        SIDE EFFECTS: (reviewed/updated 03/07/2014)  None reported or admitted to.  No noted toxicity with use of Depakote/Tegretol/lithium/Clozaril/TCAs   ALLERGIES:(reviewed/updated 03/07/2014)  No Known Allergies   MEDICATIONS PRIOR TO ADMISSION:(reviewed/updated 03/07/2014)  Prescriptions prior to admission   Medication Sig   ??? QUEtiapine (SEROQUEL) 100 mg tablet Take 100 mg by mouth nightly. Indications: SCHIZOPHRENIA      PAST MEDICAL HISTORY: Past medical history from the initial psychiatric evaluation has been reviewed (reviewed/updated 03/07/2014) with no additional updates (I asked patient and no additional past medical history provided).   Past Medical History   Diagnosis Date   ??? Schizo affective schizophrenia (HCC)    History reviewed. No pertinent past surgical history.   SOCIAL HISTORY: Social history from the initial psychiatric evaluation has  been reviewed (reviewed/updated 03/07/2014) with no additional updates (I asked patient and no additional social history provided).   History     Social History   ??? Marital Status: MARRIED     Spouse Name: N/A     Number of Children: N/A   ??? Years of Education: N/A     Occupational History   ??? Not on file.     Social History Main Topics   ??? Smoking status: Not on file   ??? Smokeless tobacco: Not on file   ??? Alcohol Use: No   ??? Drug Use: Not on file   ??? Sexual Activity: Not on file     Other Topics Concern   ??? Not on file     Social History Narrative    43 year old [place] female admitted ion a TDO from the bus terminal, where she was wandering in a confused state. She was unable to tell examiners where she had come from nor any details about her history, She had paper work with her from [facility] in [place], [place], where she had been briefly admitted for "schizoaffective disorder". She had been discharged on Seroquel 100 mg po qhs and was given a bus ticket to return to [place]. She is unable to give a reason why she left the bus, which was in transit  from [place], [place]- headed [place]. She was uncooperative with the interview and mental status exam, due to thought blocking, paranoia, and being internally preoccupind. She was committed at teh TDO hearing and forced medications were requested,as she refused to take any medications and was quite psychotic.       FAMILY HISTORY: Family history from the initial psychiatric evaluation has been reviewed (reviewed/updated 03/07/2014) with no additional updates (I asked patient and no additional family history provided). History reviewed. No pertinent family history.    REVIEW OF SYSTEMS: (reviewed/updated 03/07/2014)  Appetite:   Sleep:    All other Review of Systems:               MENTAL STATUS EXAM & VITALS     MENTAL STATUS EXAM (MSE):    MSE FINDINGS ARE WITHIN NORMAL LIMITS (WNL) UNLESS OTHERWISE STATED BELOW.    Orientation     Vital Signs (BP,Pulse, Temp) See below (reviewed 03/07/2014); vital signs are WNL if not listed below.   Gait and Station Stable/steady, no ataxia   Abnormal Muscular Movements, Tone, and Behavior No EPS, no Tardive Dyskinesia, no abnormal muscular movements; wnl tone   Relations    General Appearance:     Language No aphasia or dysarthria   Speech:     Thought Processes logical, wnl rate of thoughts, good abstract reasoning and computation   Thought Associations    Thought Content    Suicidal Ideations    Homicidal Ideations    Mood:     Affect:     Memory recent     Memory remote:     Concentration/Attention:     Fund of Knowledge average   Insight:     Reliability    Judgment:          VITALS:     Patient Vitals for the past 24 hrs:   Temp Pulse Resp BP SpO2   03/07/14 1845 99 ??F (37.2 ??C) 100 18 146/82 mmHg 96 %            DATA     LABORATORY DATA:(reviewed/updated 03/07/2014)  No results found for this or any previous visit (from the past 24 hour(s)).  No results found for: VALF2, VALAC, VALP, VALPR, DS6, CRBAM, CRBAMP, CARB2, XCRBAM  No results found for: LI, LIH, LITHM   RADIOLOGY REPORTS:(reviewed/updated 03/07/2014)  No results found.       MEDICATIONS     ALL MEDICATIONS:   Current Facility-Administered Medications   Medication Dose Route Frequency   ??? divalproex ER (DEPAKOTE ER) 24 hour tablet 1000mg  ++++++++Court ordered medication+++++++  1,000 mg Oral QHS    Or   ??? LORazepam (ATIVAN) injection 1mg  +++++++ Court Ordered Medication +++++++++  1 mg IntraMUSCular QHS   ??? fluPHENAZine (PROLIXIN) tablet 10 mg+++++Court ordered medication+++++  10 mg Oral BID    Or   ??? fluPHENAZine (PROLIXIN) injection 5 mg  5 mg IntraMUSCular BID   ??? benztropine (COGENTIN) tablet 1 mg+++++Court ordered medication+++++  1 mg Oral BID    Or   ??? benztropine (COGENTIN) injection 1 mg  1 mg IntraMUSCular BID   ??? ziprasidone (GEODON) 20 mg in sterile water (preservative free) injection  20 mg IntraMUSCular Q4H PRN    ??? OLANZapine (ZyPREXA) tablet 5 mg  5 mg Oral Q6H PRN   ??? LORazepam (ATIVAN) injection 2 mg  2 mg IntraMUSCular Q4H PRN   ??? zolpidem (AMBIEN) tablet 5 mg  5 mg Oral QHS PRN   ??? ibuprofen (  MOTRIN) tablet 400 mg  400 mg Oral Q4H PRN ??? magnesium hydroxide (MILK OF MAGNESIA) oral suspension 30 mL  30 mL Oral DAILY PRN   ??? nicotine (NICODERM CQ) 21 mg/24 hr patch 1 Patch  1 Patch TransDERmal DAILY PRN      SCHEDULED MEDICATIONS:   Current Facility-Administered Medications   Medication Dose Route Frequency   ??? divalproex ER (DEPAKOTE ER) 24 hour tablet 1000mg  ++++++++Court ordered medication+++++++  1,000 mg Oral QHS    Or   ??? LORazepam (ATIVAN) injection 1mg  +++++++ Court Ordered Medication +++++++++  1 mg IntraMUSCular QHS   ??? fluPHENAZine (PROLIXIN) tablet 10 mg+++++Court ordered medication+++++  10 mg Oral BID    Or   ??? fluPHENAZine (PROLIXIN) injection 5 mg  5 mg IntraMUSCular BID   ??? benztropine (COGENTIN) tablet 1 mg+++++Court ordered medication+++++  1 mg Oral BID    Or   ??? benztropine (COGENTIN) injection 1 mg  1 mg IntraMUSCular BID            ASSESSMENT & PLAN     The patient, Christy Henry, is a 43 y.o.  female who presents at this time for treatment of the following diagnoses: (reviewed/updated 03/07/2014)  Patient Active Hospital Problem List:   Psychosis (03/04/2014)    Assessment: paranoid, with formal thought disorder/ most likely Bipolar D/O vs. Schizoaffective D/O    Plan: apply for forced medications as pt. Is refusing mood stabilizer and antipsychotic   Altered mental state (03/04/2014)    Assessment: agitated and nternally preoccupied  On admission. No waxing or waning   Plan: monitor for all reversible causes of delirium      In summary, Christy Henry presents with a severe exacerbation of the principal diagnosis, Psychosis, which is worsening/not improving/not stable improving.    Patient requires continued inpatient hospitalization for further  stabilization. I will continue to coordinate the provision of individual, milieu, occupational, group, and substance abuse therapies to address target symptoms/diagnoses as deemed appropriate for the individual patient.     I will continue to monitor blood levels (Depakote, Tegretol, lithium, clozapine---a drug with a narrow therapeutic index= NTI) and associated labs for drug therapy implemented that require intense monitoring for toxicity as deemed appropriate base on current medication side effects and pharmacodynamically determined drug 1/2 lives.       A coordinated, multidisplinary treatment team round was conducted with the patient---this is done daily here at [facility] . This team consists of the nurse, psychiatric unit pharmcist, social worker and Clinical research associate.     The following regarding medications was addressed during rounds with patient:   the risks and benefits of the proposed medication. The patient was given the opportunity to ask questions. Informed consent given to the use of the above medications. Will continue to adjust psychiatric and non-psychiatric medications (see above "medication" section and orders section for details) as deemed appropriate & based upon diagnoses and response to treatment.     I will continue to order blood tests/labs and diagnostic tests as deemed appropriate and review results as they become available (see orders for details and above listed lab/test results).    I will order psychiatric records from previous psych hospitals to further elucidate the nature of patient's psychopathology and review once available.    I will gather additional collateral information from friends, family and o/p treatment team to further elucidate the nature of patient's psychopathology and baselline level of psychiatric functioning.    Complete current electronic health record for  patient has been reviewed today including consultant notes, ancillary staff notes, nurses and  psychiatric tech notes    I certify that this patient's inpatient psychiatric hospital services furnished since the previous certification were, and continue to be, required for treatment that could reasonably be expected to improve the patient's condition, or for diagnostic study, and that the patient continues to need, on a daily basis, active treatment furnished directly by or requiring the supervision of inpatient psychiatric facility personnel. In addition, the hospital records show that services furnished were intensive treatment services, admission or related services, or equivalent services.    EXPECTED DISCHARGE DATE/DAY: TBD     DISPOSITION: Home       Signed By:   Cheryll Cockayne, MD  03/07/2014        Srting court ordered medications. 03/06/14

## 2014-03-07 NOTE — Behavioral Health Treatment Team (Signed)
Did not attend Community Group.

## 2014-03-07 NOTE — Progress Notes (Signed)
Problem: Suicide/Homicide (Adult/Pediatric)  Goal: *STG: Remains safe in hospital  Outcome: Progressing Towards Goal  Denies Current SI at this time.

## 2014-03-07 NOTE — Behavioral Health Treatment Team (Signed)
At change of shift, pt resting comfortably without distress. Respirations are even and unlabored.  Continue to monitor and support.

## 2014-03-07 NOTE — Behavioral Health Treatment Team (Signed)
Patient has been angry especially about her meds said"  You need to look at my record   i don't need this"  Patient finally agreed to take her meds po  But attempted to hide them in her cheek but then finally swallowed them. Pt isolating her room for the most part  And refused to talk with treatment team. Continue q 15 checks safety

## 2014-03-08 MED FILL — FLUPHENAZINE 5 MG TAB: 5 mg | ORAL | Qty: 2

## 2014-03-08 MED FILL — BENZTROPINE 1 MG TAB: 1 mg | ORAL | Qty: 1

## 2014-03-08 MED FILL — DIVALPROEX 500 MG 24 HR TAB: 500 mg | ORAL | Qty: 2

## 2014-03-08 NOTE — Behavioral Health Treatment Team (Signed)
Care Management Note:    SS spoke to Milinda AntisBarbara Dickens at the [facility] ( mental health)  in [place], [place] [phone].   Patients care team has been looking for her since her discharge from [facility] in [place].  [place].  Patient has a history of leaving the area and non compliance with her medications.  She is followed by a ACT team for medications  Narda Amber( [place] [phone] )   They are glad is okay and here under a commitment and forced medications.  SS will continue to follow.       [employer] [facility] [address]   Pam [phone]

## 2014-03-08 NOTE — Behavioral Health Treatment Team (Signed)
Received pt resting quietly in bed at this time, offers no self disclosures. Staff will continue to monitor q 15 min checks.

## 2014-03-08 NOTE — Behavioral Health Treatment Team (Signed)
0600--Patient slept 11 hours.

## 2014-03-08 NOTE — Behavioral Health Treatment Team (Signed)
Pt did not attend reflections group this evening.

## 2014-03-08 NOTE — Behavioral Health Treatment Team (Signed)
Pt did not attend Community Group this morning.

## 2014-03-08 NOTE — Progress Notes (Addendum)
GROUP THERAPY PROGRESS NOTE    Georgette Shellrica Reed is participating in Process Group.     Group time: 45 minutes    Group therapy participation:       Alcario Droughtrica participated actively in a group discussion of goals, medications, and treatment frustrations.  Alcario Droughtrica was quiet active in group today as she was very vocal about her frustrations with her treatment her and issues with her doctor.

## 2014-03-08 NOTE — Behavioral Health Treatment Team (Signed)
PSYCHIATRIC PROGRESS NOTE         Patient Name  Christy Henry   Date of Birth 07/06/1970   CSN [id number]   Medical Record Number  [id number]      Age  43 y.o.   PCP None   Admit date:  03/03/2014    Room Number  736/01  @ [facility]   Date of Service  03/08/2014            PSYCHOTHERAPY SESSION NOTE:  Length of psychotherapy session: 30 minutes  Main condition/diagnosis/issues treated during session today: bipolar disorder symptoms, formal thought disorder and medication compliance    Interpersonal relationship issues and psychodynamic conflicts explored. Supportive psychotherapy provided in regards to various ongoing psychosocial stressors (including some of the following):   pre-admission and current problems   Housing issues   Medical issues   Interpersonal conflicts   Stress of hospitalization              Worked on issues of denial & effects of substance dependency/use  Cognitive/Behavioral therapy provided  Reality-Oriented psychotherapy provided     Overall, patient is not progressing.    An extended energy and skill set was needed to engage pt in psychotherapy due to some of the following: resistiveness, complexity, negativity, confrontational nature, hostile behaviors, and/or severe abnormalities in thought processes/psychosis resulting in the loss of expressive/receptive language communication skills.                      E & M PROGRESS NOTE:         HISTORY       CC/HISTORY OF PRESENT ILLNESS/INTERVAL HISTORY:  (reviewed/updated 03/08/2014).  The patient, Christy Henry, is a [age] y.o. BLACK [place] female with a past psychiatric history of schizoaffetcive d/o vs bipolar d/o, who presents at this time for an exacerbation of the principle diagnosis of Psychosis. Patient reports/evidences the following emotional symptoms:  agitation and psychotic behavior.  The above symptoms have been present for 2-3 days. These symptoms are of severe severity. The symptoms are  constant in nature.  Additional symptomatology include agitation, anger outbursts and difficulty sleeping . The patient's condition has been precipitated by traveling [place] the bus from [place] and "feeling tired" and psychosocial stressors (poor social supports ,, possible homelessness ).  Condition made worse by treatment noncompliance. UDS negative, BAL=0.    03/06/14- refusing medications and very angry/ agitated. Needs direction to acre for hygiene. Internally preoccupied. No insight.        SIDE EFFECTS: (reviewed/updated 03/08/2014)  None reported or admitted to.  No noted toxicity with use of Depakote/Tegretol/lithium/Clozaril/TCAs   ALLERGIES:(reviewed/updated 03/08/2014)  No Known Allergies   MEDICATIONS PRIOR TO ADMISSION:(reviewed/updated 03/08/2014)  Prescriptions prior to admission   Medication Sig   ??? QUEtiapine (SEROQUEL) 100 mg tablet Take 100 mg by mouth nightly. Indications: SCHIZOPHRENIA      PAST MEDICAL HISTORY: Past medical history from the initial psychiatric evaluation has been reviewed (reviewed/updated 03/08/2014) with no additional updates (I asked patient and no additional past medical history provided).   Past Medical History   Diagnosis Date   ??? Schizo affective schizophrenia (HCC)    History reviewed. No pertinent past surgical history.   SOCIAL HISTORY: Social history from the initial psychiatric evaluation has been reviewed (reviewed/updated 03/08/2014) with no additional updates (I asked patient and no additional social history provided).   History     Social History   ??? Marital  Status: MARRIED     Spouse Name: N/A     Number of Children: N/A   ??? Years of Education: N/A     Occupational History   ??? Not on file.     Social History Main Topics   ??? Smoking status: Not on file   ??? Smokeless tobacco: Not on file   ??? Alcohol Use: No   ??? Drug Use: Not on file   ??? Sexual Activity: Not on file     Other Topics Concern   ??? Not on file     Social History Narrative     43 year old [place] American female admitted ion a TDO from the bus terminal, where she was wandering in a confused state. She was unable to tell examiners where she had come from nor any details about her history, She had paper work with her from [facility] in [place], [place], where she had been briefly admitted for "schizoaffective disorder". She had been discharged on Seroquel 100 mg po qhs and was given a bus ticket to return to [place]. She is unable to give a reason why she left the bus, which was in transit from [place], [place]- headed [place]. She was uncooperative with the interview and mental status exam, due to thought blocking, paranoia, and being internally preoccupind. She was committed at teh TDO hearing and forced medications were requested,as she refused to take any medications and was quite psychotic.       FAMILY HISTORY: Family history from the initial psychiatric evaluation has been reviewed (reviewed/updated 03/08/2014) with no additional updates (I asked patient and no additional family history provided). History reviewed. No pertinent family history.    REVIEW OF SYSTEMS: (reviewed/updated 03/08/2014)  Appetite:no change from normal   Sleep: decreased more than normal   All other Review of Systems:      Psychological ROS: positive for - hostility and irritability  Cardiovascular ROS: no chest pain or dyspnea on exertion         MENTAL STATUS EXAM & VITALS     MENTAL STATUS EXAM (MSE):    MSE FINDINGS ARE WITHIN NORMAL LIMITS (WNL) UNLESS OTHERWISE STATED BELOW.    Orientation disorganized   Vital Signs (BP,Pulse, Temp) See below (reviewed 03/08/2014); vital signs are WNL if not listed below.   Gait and Station Stable/steady, no ataxia   Abnormal Muscular Movements, Tone, and Behavior No EPS, no Tardive Dyskinesia, no abnormal muscular movements; wnl tone   Relations evasive, guarded and uncooperative   General Appearance:  disheveled, malodorous and older than stated age    Language No aphasia or dysarthria   Speech:  hyperverbal, loud and pressured   Thought Processes disorganized   Thought Associations flight of ideas and loose associations   Thought Content preoccupations   Suicidal Ideations no plan  and no intention   Homicidal Ideations no plan  and no intention   Mood:  hostile and irritable   Affect:  full range   Memory recent  adequate   Memory remote:  adequate   Concentration/Attention:  adequate   Fund of Knowledge average   Insight:  poor   Reliability poor   Judgment:  poor        VITALS:     Patient Vitals for the past 24 hrs:   Temp Pulse Resp BP SpO2   03/08/14 0438 - - - 93/55 mmHg -   03/08/14 0435 98.3 ??F (36.8 ??C) 76 20 (!) 87/58 mmHg 100 % 03/07/14  1845 99 ??F (37.2 ??C) 100 18 146/82 mmHg 96 %            DATA     LABORATORY DATA:(reviewed/updated 03/08/2014)  No results found for this or any previous visit (from the past 24 hour(s)).  No results found for: VALF2, VALAC, VALP, VALPR, DS6, CRBAM, CRBAMP, CARB2, XCRBAM  No results found for: LI, LIH, LITHM   RADIOLOGY REPORTS:(reviewed/updated 03/08/2014)  No results found.       MEDICATIONS     ALL MEDICATIONS:   Current Facility-Administered Medications   Medication Dose Route Frequency   ??? divalproex ER (DEPAKOTE ER) 24 hour tablet 1000mg  ++++++++Court ordered medication+++++++  1,000 mg Oral QHS    Or ??? LORazepam (ATIVAN) injection 1mg  +++++++ Court Ordered Medication +++++++++  1 mg IntraMUSCular QHS   ??? fluPHENAZine (PROLIXIN) tablet 10 mg+++++Court ordered medication+++++  10 mg Oral BID    Or   ??? fluPHENAZine (PROLIXIN) injection 5 mg  5 mg IntraMUSCular BID   ??? benztropine (COGENTIN) tablet 1 mg+++++Court ordered medication+++++  1 mg Oral BID    Or   ??? benztropine (COGENTIN) injection 1 mg  1 mg IntraMUSCular BID   ??? ziprasidone (GEODON) 20 mg in sterile water (preservative free) injection  20 mg IntraMUSCular Q4H PRN   ??? OLANZapine (ZyPREXA) tablet 5 mg  5 mg Oral Q6H PRN    ??? LORazepam (ATIVAN) injection 2 mg  2 mg IntraMUSCular Q4H PRN   ??? zolpidem (AMBIEN) tablet 5 mg  5 mg Oral QHS PRN   ??? ibuprofen (MOTRIN) tablet 400 mg  400 mg Oral Q4H PRN   ??? magnesium hydroxide (MILK OF MAGNESIA) oral suspension 30 mL  30 mL Oral DAILY PRN   ??? nicotine (NICODERM CQ) 21 mg/24 hr patch 1 Patch  1 Patch TransDERmal DAILY PRN      SCHEDULED MEDICATIONS:   Current Facility-Administered Medications   Medication Dose Route Frequency   ??? divalproex ER (DEPAKOTE ER) 24 hour tablet 1000mg  ++++++++Court ordered medication+++++++  1,000 mg Oral QHS    Or   ??? LORazepam (ATIVAN) injection 1mg  +++++++ Court Ordered Medication +++++++++  1 mg IntraMUSCular QHS   ??? fluPHENAZine (PROLIXIN) tablet 10 mg+++++Court ordered medication+++++  10 mg Oral BID    Or   ??? fluPHENAZine (PROLIXIN) injection 5 mg  5 mg IntraMUSCular BID   ??? benztropine (COGENTIN) tablet 1 mg+++++Court ordered medication+++++  1 mg Oral BID    Or   ??? benztropine (COGENTIN) injection 1 mg  1 mg IntraMUSCular BID            ASSESSMENT & PLAN     The patient, Christy Henry, is a 43 y.o.  female who presents at this time for treatment of the following diagnoses: (reviewed/updated 03/08/2014)  Patient Active Hospital Problem List:   Psychosis (03/04/2014)    Assessment: disorganized thinking with formal thought disorder occurring in the context of labile affect, pressured speech and irritable mood. - Bipolar Disorder, Mania, VS. Schiazoaffective D/O    Plan:Start Depakote and prolixin*     Altered mental state (03/04/2014)    Assessment: flight of ideas, disorientation, loose associations, hyper alert. Not waxing/ waning, delirium ruled out.    Plan:Treat manic sxs as undrelying cause  Hypertension:  Assessment:Stable  Plan: antihypertensive medications  Hypercholesterolemia:  Assessment: Stable  Plan:lopid lowering medications and dietary management        In summary, Christy Henry presents with a severe exacerbation of the  principal diagnosis, Psychosis,  which is worsening/not improving/not stable .    Patient requires continued inpatient hospitalization for further stabilization. I will continue to coordinate the provision of individual, milieu, occupational, group, and substance abuse therapies to address target symptoms/diagnoses as deemed appropriate for the individual patient.     I will continue to monitor blood levels (Depakote, Tegretol, lithium, clozapine---a drug with a narrow therapeutic index= NTI) and associated labs for drug therapy implemented that require intense monitoring for toxicity as deemed appropriate base on current medication side effects and pharmacodynamically determined drug 1/2 lives.       A coordinated, multidisplinary treatment team round was conducted with the patient---this is done daily here at [facility] . This team consists of the nurse, psychiatric unit pharmcist, social worker and Clinical research associate.     The following regarding medications was addressed during rounds with patient:   the risks and benefits of the proposed medication. The patient was given the opportunity to ask questions. Informed consent given to the use of the above medications. Will continue to adjust psychiatric and non-psychiatric medications (see above "medication" section and orders section for details) as deemed appropriate & based upon diagnoses and response to treatment.     I will continue to order blood tests/labs and diagnostic tests as deemed appropriate and review results as they become available (see orders for details and above listed lab/test results).    I will order psychiatric records from previous psych hospitals to further elucidate the nature of patient's psychopathology and review once available.    I will gather additional collateral information from friends, family and o/p treatment team to further elucidate the nature of patient's psychopathology and baselline level of psychiatric functioning.     Complete current electronic health record for patient has been reviewed today including consultant notes, ancillary staff notes, nurses and psychiatric tech notes    I certify that this patient's inpatient psychiatric hospital services furnished since the previous certification were, and continue to be, required for treatment that could reasonably be expected to improve the patient's condition, or for diagnostic study, and that the patient continues to need, on a daily basis, active treatment furnished directly by or requiring the supervision of inpatient psychiatric facility personnel. In addition, the hospital records show that services furnished were intensive treatment services, admission or related services, or equivalent services.    EXPECTED DISCHARGE DATE/DAY: 3-5 daysTBD     DISPOSITION: Home       Signed By:   Cheryll Cockayne, MD  03/08/2014          PSYCHIATRIC PROGRESS NOTE         Patient Name  Christy Henry   Date of Birth 1970-06-06   CSN [id number]   Medical Record Number  [id number]      Age  15 y.o.   PCP None   Admit date:  03/03/2014    Room Number  736/01  @ [facility]   Date of Service  03/08/2014            PSYCHOTHERAPY SESSION NOTE:  Length of psychotherapy session: 30 minutes  Main condition/diagnosis/issues treated during session today: mania, formal thought disorder    Interpersonal relationship issues and psychodynamic conflicts explored. Supportive psychotherapy provided in regards to various ongoing psychosocial stressors (including some of the following):   pre-admission and current problems   Housing issues   Occupational issues   Academic issues   Legal issues   Medical issues   Interpersonal conflicts  Stress of hospitalization              Worked on issues of denial & effects of substance dependency/use  Cognitive/Behavioral therapy provided  Reality-Oriented psychotherapy provided     Overall, patient is not progressing.     An extended energy and skill set was needed to engage pt in psychotherapy due to some of the following: resistiveness, complexity, negativity, confrontational nature, hostile behaviors, and/or severe abnormalities in thought processes/psychosis resulting in the loss of expressive/receptive language communication skills.                      E & M PROGRESS NOTE:         HISTORY       CC/HISTORY OF PRESENT ILLNESS/INTERVAL HISTORY:  (reviewed/updated 03/08/2014).  The patient, Christy Henry, is a 43 y.o. BLACK [place] female with a past psychiatric history of , who presents at this time for an exacerbation of the principle diagnosis of Psychosis. Patient reports/evidences the following emotional symptoms:  .  The above symptoms have been present for . These symptoms are of  severity. The symptoms are constant intermittent/ fleeting in nature.  Additional symptomatology include  . The patient's condition has been precipitated by  and psychosocial stressors ( ).  Condition made worse by alcohol use as well as treatment noncompliance. UDS+ MJ, BAL=0.    03/07/14- continues to refuse medications. Openly hostile and ignoring her personal hygiene. Pranoid and internally preoccupied    03/08/14- accepted depakote and slept 11 hours. Much more organized and less irritable.         SIDE EFFECTS: (reviewed/updated 03/08/2014)  None reported or admitted to.  No noted toxicity with use of Depakote/Tegretol/lithium/Clozaril/TCAs   ALLERGIES:(reviewed/updated 03/08/2014)  No Known Allergies   MEDICATIONS PRIOR TO ADMISSION:(reviewed/updated 03/08/2014)  Prescriptions prior to admission   Medication Sig   ??? QUEtiapine (SEROQUEL) 100 mg tablet Take 100 mg by mouth nightly. Indications: SCHIZOPHRENIA      PAST MEDICAL HISTORY: Past medical history from the initial psychiatric evaluation has been reviewed (reviewed/updated 03/08/2014) with no additional updates (I asked patient and no additional past medical history  provided).   Past Medical History   Diagnosis Date   ??? Schizo affective schizophrenia (HCC)    History reviewed. No pertinent past surgical history.   SOCIAL HISTORY: Social history from the initial psychiatric evaluation has been reviewed (reviewed/updated 03/08/2014) with no additional updates (I asked patient and no additional social history provided).   History     Social History   ??? Marital Status: MARRIED     Spouse Name: N/A     Number of Children: N/A   ??? Years of Education: N/A     Occupational History   ??? Not on file.     Social History Main Topics   ??? Smoking status: Not on file   ??? Smokeless tobacco: Not on file   ??? Alcohol Use: No   ??? Drug Use: Not on file   ??? Sexual Activity: Not on file     Other Topics Concern   ??? Not on file     Social History Narrative    43 year old [place] American female admitted ion a TDO from the bus terminal, where she was wandering in a confused state. She was unable to tell examiners where she had come from nor any details about her history, She had paper work with her from [facility] in [place], [place], where she  had been briefly admitted for "schizoaffective disorder". She had been discharged on Seroquel 100 mg po qhs and was given a bus ticket to return to [place]. She is unable to give a reason why she left the bus, which was in transit from [place], [place]- headed [place]. She was uncooperative with the interview and mental status exam, due to thought blocking, paranoia, and being internally preoccupind. She was committed at teh TDO hearing and forced medications were requested,as she refused to take any medications and was quite psychotic.       FAMILY HISTORY: Family history from the initial psychiatric evaluation has been reviewed (reviewed/updated 03/08/2014) with no additional updates (I asked patient and no additional family history provided). History reviewed. No pertinent family history.    REVIEW OF SYSTEMS: (reviewed/updated 03/08/2014)   Appetite:   Sleep:    All other Review of Systems:               MENTAL STATUS EXAM & VITALS     MENTAL STATUS EXAM (MSE):    MSE FINDINGS ARE WITHIN NORMAL LIMITS (WNL) UNLESS OTHERWISE STATED BELOW.    Orientation    Vital Signs (BP,Pulse, Temp) See below (reviewed 03/08/2014); vital signs are WNL if not listed below.   Gait and Station Stable/steady, no ataxia   Abnormal Muscular Movements, Tone, and Behavior No EPS, no Tardive Dyskinesia, no abnormal muscular movements; wnl tone   Relations    General Appearance:     Language No aphasia or dysarthria   Speech:     Thought Processes logical, wnl rate of thoughts, good abstract reasoning and computation   Thought Associations    Thought Content    Suicidal Ideations    Homicidal Ideations    Mood:     Affect:     Memory recent     Memory remote:     Concentration/Attention:     Fund of Knowledge average   Insight:     Reliability    Judgment:          VITALS:     Patient Vitals for the past 24 hrs:   Temp Pulse Resp BP SpO2   03/08/14 0438 - - - 93/55 mmHg -   03/08/14 0435 98.3 ??F (36.8 ??C) 76 20 (!) 87/58 mmHg 100 %   03/07/14 1845 99 ??F (37.2 ??C) 100 18 146/82 mmHg 96 %            DATA     LABORATORY DATA:(reviewed/updated 03/08/2014)  No results found for this or any previous visit (from the past 24 hour(s)).  No results found for: VALF2, VALAC, VALP, VALPR, DS6, CRBAM, CRBAMP, CARB2, XCRBAM  No results found for: LI, LIH, LITHM   RADIOLOGY REPORTS:(reviewed/updated 03/08/2014)  No results found.       MEDICATIONS     ALL MEDICATIONS:   Current Facility-Administered Medications   Medication Dose Route Frequency   ??? divalproex ER (DEPAKOTE ER) 24 hour tablet 1000mg  ++++++++Court ordered medication+++++++  1,000 mg Oral QHS    Or   ??? LORazepam (ATIVAN) injection 1mg  +++++++ Court Ordered Medication +++++++++  1 mg IntraMUSCular QHS   ??? fluPHENAZine (PROLIXIN) tablet 10 mg+++++Court ordered medication+++++  10 mg Oral BID    Or    ??? fluPHENAZine (PROLIXIN) injection 5 mg  5 mg IntraMUSCular BID ??? benztropine (COGENTIN) tablet 1 mg+++++Court ordered medication+++++  1 mg Oral BID    Or   ??? benztropine (COGENTIN) injection 1 mg  1 mg  IntraMUSCular BID   ??? ziprasidone (GEODON) 20 mg in sterile water (preservative free) injection  20 mg IntraMUSCular Q4H PRN   ??? OLANZapine (ZyPREXA) tablet 5 mg  5 mg Oral Q6H PRN   ??? LORazepam (ATIVAN) injection 2 mg  2 mg IntraMUSCular Q4H PRN   ??? zolpidem (AMBIEN) tablet 5 mg  5 mg Oral QHS PRN   ??? ibuprofen (MOTRIN) tablet 400 mg  400 mg Oral Q4H PRN   ??? magnesium hydroxide (MILK OF MAGNESIA) oral suspension 30 mL  30 mL Oral DAILY PRN   ??? nicotine (NICODERM CQ) 21 mg/24 hr patch 1 Patch  1 Patch TransDERmal DAILY PRN      SCHEDULED MEDICATIONS:   Current Facility-Administered Medications   Medication Dose Route Frequency   ??? divalproex ER (DEPAKOTE ER) 24 hour tablet 1000mg  ++++++++Court ordered medication+++++++  1,000 mg Oral QHS    Or   ??? LORazepam (ATIVAN) injection 1mg  +++++++ Court Ordered Medication +++++++++  1 mg IntraMUSCular QHS   ??? fluPHENAZine (PROLIXIN) tablet 10 mg+++++Court ordered medication+++++  10 mg Oral BID    Or   ??? fluPHENAZine (PROLIXIN) injection 5 mg  5 mg IntraMUSCular BID   ??? benztropine (COGENTIN) tablet 1 mg+++++Court ordered medication+++++  1 mg Oral BID    Or   ??? benztropine (COGENTIN) injection 1 mg  1 mg IntraMUSCular BID            ASSESSMENT & PLAN     The patient, Christy Henry, is a 43 y.o.  female who presents at this time for treatment of the following diagnoses: (reviewed/updated 03/08/2014)  Patient Active Hospital Problem List:   Psychosis (03/04/2014)    Assessment: paranoid, with formal thought disorder/ most likely Bipolar D/O vs. Schizoaffective D/O    Plan: apply for forced medications as pt. Is refusing mood stabilizer and antipsychotic  03/08/14- continue depakote and check level   Altered mental state (03/04/2014)     Assessment: agitated and nternally preoccupied  On admission. No waxing or waning   Plan: monitor for all reversible causes of delirium      In summary, Christy Henry presents with a severe exacerbation of the principal diagnosis, Psychosis, which is worsening/not improving/not stable improving.    Patient requires continued inpatient hospitalization for further stabilization. I will continue to coordinate the provision of individual, milieu, occupational, group, and substance abuse therapies to address target symptoms/diagnoses as deemed appropriate for the individual patient.     I will continue to monitor blood levels (Depakote, Tegretol, lithium, clozapine---a drug with a narrow therapeutic index= NTI) and associated labs for drug therapy implemented that require intense monitoring for toxicity as deemed appropriate base on current medication side effects and pharmacodynamically determined drug 1/2 lives.       A coordinated, multidisplinary treatment team round was conducted with the patient---this is done daily here at [facility] . This team consists of the nurse, psychiatric unit pharmcist, social worker and Clinical research associate.     The following regarding medications was addressed during rounds with patient:   the risks and benefits of the proposed medication. The patient was given the opportunity to ask questions. Informed consent given to the use of the above medications. Will continue to adjust psychiatric and non-psychiatric medications (see above "medication" section and orders section for details) as deemed appropriate & based upon diagnoses and response to treatment.     I will continue to order blood tests/labs and diagnostic tests as deemed appropriate and review results  as they become available (see orders for details and above listed lab/test results).    I will order psychiatric records from previous psych hospitals to further elucidate the nature of patient's psychopathology and review once  available.    I will gather additional collateral information from friends, family and o/p treatment team to further elucidate the nature of patient's psychopathology and baselline level of psychiatric functioning.    Complete current electronic health record for patient has been reviewed today including consultant notes, ancillary staff notes, nurses and psychiatric tech notes    I certify that this patient's inpatient psychiatric hospital services furnished since the previous certification were, and continue to be, required for treatment that could reasonably be expected to improve the patient's condition, or for diagnostic study, and that the patient continues to need, on a daily basis, active treatment furnished directly by or requiring the supervision of inpatient psychiatric facility personnel. In addition, the hospital records show that services furnished were intensive treatment services, admission or related services, or equivalent services.    EXPECTED DISCHARGE DATE/DAY: TBD     DISPOSITION: Home       Signed By:   Cheryll Cockayne, MD  03/08/2014        Srting court ordered medications. 03/06/14

## 2014-03-08 NOTE — Behavioral Health Treatment Team (Signed)
Pt isolating in her room. Took medications as ordered. Did not attend the Community Group. Guarded on verbal exchange. No distress noted. Will continue to monitor.

## 2014-03-09 MED ORDER — TRIHEXYPHENIDYL 2 MG TAB
2 mg | Freq: Two times a day (BID) | ORAL | Status: DC
Start: 2014-03-09 — End: 2014-03-10
  Administered 2014-03-09 – 2014-03-10 (×2): via ORAL

## 2014-03-09 MED FILL — FLUPHENAZINE 5 MG TAB: 5 mg | ORAL | Qty: 2

## 2014-03-09 MED FILL — BENZTROPINE 1 MG TAB: 1 mg | ORAL | Qty: 1

## 2014-03-09 MED FILL — DIVALPROEX 500 MG 24 HR TAB: 500 mg | ORAL | Qty: 2

## 2014-03-09 MED FILL — TRIHEXYPHENIDYL 2 MG TAB: 2 mg | ORAL | Qty: 1

## 2014-03-09 NOTE — Behavioral Health Treatment Team (Signed)
Pt is laying in bed on initial rounds.  Her eyes are open.  She is facing the wall and does not acknowledge staff.

## 2014-03-09 NOTE — Progress Notes (Signed)
Problem: Altered Thought Process (Adult/Pediatric)  Goal: *STG: Participates in treatment plan  Outcome: Progressing Towards Goal  Participates in treatment team daily.

## 2014-03-09 NOTE — Behavioral Health Treatment Team (Signed)
Patient appeared to sleep 9+ hours. No distress noted.

## 2014-03-09 NOTE — Progress Notes (Signed)
Problem: Altered Thought Process (Adult/Pediatric)  Goal: *STG: Seeks staff when feelings of anxiety and fear arise  Outcome: Not Progressing Towards Goal  Variance: Patient Condition  Comments: Pt does not acknowledge staff, as staff does rounds.

## 2014-03-09 NOTE — Behavioral Health Treatment Team (Signed)
Patient did not attend community group.

## 2014-03-09 NOTE — Progress Notes (Signed)
GROUP THERAPY PROGRESS NOTE     Did not attend group today.

## 2014-03-09 NOTE — Behavioral Health Treatment Team (Signed)
Continues isolation pattern in room.Has been medication compliant.Less irritability and mood is more stable and pleasant.Focused on when "discharge will occur.Expressed desire on "hope to be home in [place] for Thanksgiving."

## 2014-03-09 NOTE — Behavioral Health Treatment Team (Addendum)
PSYCHIATRIC PROGRESS NOTE         Patient Name  Christy Henry   Date of Birth 1970-09-23   CSN [id number]   Medical Record Number  [id number]      Age  43 y.o.   PCP None   Admit date:  03/03/2014    Room Number  736/01  @ [facility]   Date of Service  03/09/2014            PSYCHOTHERAPY SESSION NOTE:  Length of psychotherapy session: 30 minutes  Main condition/diagnosis/issues treated during session today: bipolar disorder symptoms, formal thought disorder and medication compliance    Interpersonal relationship issues and psychodynamic conflicts explored. Supportive psychotherapy provided in regards to various ongoing psychosocial stressors (including some of the following):   pre-admission and current problems   Housing issues   Medical issues   Interpersonal conflicts   Stress of hospitalization              Worked on issues of denial & effects of substance dependency/use  Cognitive/Behavioral therapy provided  Reality-Oriented psychotherapy provided     Overall, patient is not progressing.    An extended energy and skill set was needed to engage pt in psychotherapy due to some of the following: resistiveness, complexity, negativity, confrontational nature, hostile behaviors, and/or severe abnormalities in thought processes/psychosis resulting in the loss of expressive/receptive language communication skills.                      E & M PROGRESS NOTE:         HISTORY       CC/HISTORY OF PRESENT ILLNESS/INTERVAL HISTORY:  (reviewed/updated 03/09/2014).  The patient, Christy Henry, is a 43 y.o. BLACK OR [place] female with a past psychiatric history of schizoaffetcive d/o vs bipolar d/o, who presents at this time for an exacerbation of the principle diagnosis of Psychosis. Patient reports/evidences the following emotional symptoms:  agitation and psychotic behavior.  The above symptoms have been present for 2-3 days. These symptoms are of severe severity. The symptoms are  constant in nature.  Additional symptomatology include agitation, anger outbursts and difficulty sleeping . The patient's condition has been precipitated by traveling on the bus from [place] to [place] and "feeling tired" and psychosocial stressors (poor social supports ,, possible homelessness ).  Condition made worse by treatment noncompliance. UDS negative, BAL=0.    03/09/14- Christy Henry continues to improve- less hostile, less delusional. She expressed appropriate concern about cancelling credit cards that she lost. No acute events over night. She is complaining of "shaking all over".      SIDE EFFECTS: (reviewed/updated 03/09/2014)  None reported or admitted to.  No noted toxicity with use of Depakote   ALLERGIES:(reviewed/updated 03/09/2014)  No Known Allergies   MEDICATIONS PRIOR TO ADMISSION:(reviewed/updated 03/09/2014)  Prescriptions prior to admission   Medication Sig   ??? QUEtiapine (SEROQUEL) 100 mg tablet Take 100 mg by mouth nightly. Indications: SCHIZOPHRENIA      PAST MEDICAL HISTORY: Past medical history from the initial psychiatric evaluation has been reviewed (reviewed/updated 03/09/2014) with no additional updates (I asked patient and no additional past medical history provided).   Past Medical History   Diagnosis Date   ??? Schizo affective schizophrenia (HCC)    History reviewed. No pertinent past surgical history.   SOCIAL HISTORY: Social history from the initial psychiatric evaluation has been reviewed (reviewed/updated 03/09/2014) with no additional updates (I asked patient and no additional social history  provided).   History     Social History   ??? Marital Status: MARRIED     Spouse Name: N/A     Number of Children: N/A   ??? Years of Education: N/A   Occupational History   ??? Not on file.     Social History Main Topics   ??? Smoking status: Not on file   ??? Smokeless tobacco: Not on file ??? Alcohol Use: No   ??? Drug Use: Not on file   ??? Sexual Activity: Not on file     Other Topics Concern    ??? Not on file     Social History Narrative    43 year old [place] female admitted ion a TDO from the bus terminal, where she was wandering in a confused state. She was unable to tell examiners where she had come from nor any details about her history, She had paper work with her from [facility] in [place], [place], where she had been briefly admitted for "schizoaffective disorder". She had been discharged on Seroquel 100 mg po qhs and was given a bus ticket to return to [place]. She is unable to give a reason why she left the bus, which was in transit from [place]. She was uncooperative with the interview and mental status exam, due to thought blocking, paranoia, and being internally preoccupind. She was committed at teh TDO hearing and forced medications were requested,as she refused to take any medications and was quite psychotic.       FAMILY HISTORY: Family history from the initial psychiatric evaluation has been reviewed (reviewed/updated 03/09/2014) with no additional updates (I asked patient and no additional family history provided). History reviewed. No pertinent family history.    REVIEW OF SYSTEMS: (reviewed/updated 03/09/2014)  Appetite:no change from normal   Sleep: decreased more than normal   All other Review of Systems:      Psychological ROS: positive for - hostility and irritability  Cardiovascular ROS: no chest pain or dyspnea on exertion         MENTAL STATUS EXAM & VITALS     MENTAL STATUS EXAM (MSE):    MSE FINDINGS ARE WITHIN NORMAL LIMITS (WNL) UNLESS OTHERWISE STATED BELOW.    Orientation disorganized   Vital Signs (BP,Pulse, Temp) See below (reviewed 03/09/2014); vital signs are WNL if not listed below.   Gait and Station Stable/steady, no ataxia   Abnormal Muscular Movements, Tone, and Behavior No EPS, no Tardive Dyskinesia, no abnormal muscular movements; wnl tone   Relations evasive, guarded and uncooperative    General Appearance:  disheveled, malodorous and older than stated age   Language No aphasia or dysarthria   Speech:  hyperverbal, loud and pressured   Thought Processes disorganized   Thought Associations flight of ideas and loose associations   Thought Content preoccupations   Suicidal Ideations no plan  and no intention   Homicidal Ideations no plan  and no intention   Mood:  hostile and irritable   Affect:  full range   Memory recent  adequate   Memory remote:  adequate   Concentration/Attention:  adequate   Fund of Knowledge average   Insight:  poor   Reliability poor   Judgment:  poor        VITALS:     Patient Vitals for the past 24 hrs:   Temp Pulse Resp BP SpO2   03/09/14 0740 98.5 ??F (36.9 ??C) 65 17 106/58 mmHg 97 %  03/08/14 2000 99.1 ??F (37.3 ??C) 67 16 106/49 mmHg 97 %   03/08/14 1711 98 ??F (36.7 ??C) 86 16 104/69 mmHg -            DATA     LABORATORY DATA:(reviewed/updated 03/09/2014)  No results found for this or any previous visit (from the past 24 hour(s)).  No results found for: VALF2, VALAC, VALP, VALPR, DS6, CRBAM, CRBAMP, CARB2, XCRBAM  No results found for: LI, LIH, LITHM   RADIOLOGY REPORTS:(reviewed/updated 03/09/2014)  No results found.       MEDICATIONS     ALL MEDICATIONS:   Current Facility-Administered Medications   Medication Dose Route Frequency   ??? divalproex ER (DEPAKOTE ER) 24 hour tablet 1000mg  ++++++++Court ordered medication+++++++  1,000 mg Oral QHS    Or   ??? LORazepam (ATIVAN) injection 1mg  +++++++ Court Ordered Medication +++++++++  1 mg IntraMUSCular QHS   ??? fluPHENAZine (PROLIXIN) tablet 10 mg+++++Court ordered medication+++++  10 mg Oral BID    Or   ??? fluPHENAZine (PROLIXIN) injection 5 mg  5 mg IntraMUSCular BID   ??? benztropine (COGENTIN) tablet 1 mg+++++Court ordered medication+++++  1 mg Oral BID    Or   ??? benztropine (COGENTIN) injection 1 mg  1 mg IntraMUSCular BID   ??? ziprasidone (GEODON) 20 mg in sterile water (preservative free)  injection  20 mg IntraMUSCular Q4H PRN   ??? OLANZapine (ZyPREXA) tablet 5 mg  5 mg Oral Q6H PRN   ??? LORazepam (ATIVAN) injection 2 mg  2 mg IntraMUSCular Q4H PRN   ??? zolpidem (AMBIEN) tablet 5 mg  5 mg Oral QHS PRN   ??? ibuprofen (MOTRIN) tablet 400 mg  400 mg Oral Q4H PRN   ??? magnesium hydroxide (MILK OF MAGNESIA) oral suspension 30 mL  30 mL Oral DAILY PRN   ??? nicotine (NICODERM CQ) 21 mg/24 hr patch 1 Patch  1 Patch TransDERmal DAILY PRN      SCHEDULED MEDICATIONS:   Current Facility-Administered Medications   Medication Dose Route Frequency   ??? divalproex ER (DEPAKOTE ER) 24 hour tablet 1000mg  ++++++++Court ordered medication+++++++  1,000 mg Oral QHS    Or   ??? LORazepam (ATIVAN) injection 1mg  +++++++ Court Ordered Medication +++++++++  1 mg IntraMUSCular QHS   ??? fluPHENAZine (PROLIXIN) tablet 10 mg+++++Court ordered medication+++++  10 mg Oral BID    Or   ??? fluPHENAZine (PROLIXIN) injection 5 mg  5 mg IntraMUSCular BID   ??? benztropine (COGENTIN) tablet 1 mg+++++Court ordered medication+++++  1 mg Oral BID    Or   ??? benztropine (COGENTIN) injection 1 mg  1 mg IntraMUSCular BID            ASSESSMENT & PLAN     The patient, Christy Henry, is a 43 y.o.  female who presents at this time for treatment of the following diagnoses: (reviewed/updated 03/09/2014)  Patient Active Hospital Problem List:   Psychosis (03/04/2014)    Assessment: disorganized thinking with formal thought disorder occurring in the context of labile affect, pressured speech and irritable mood. - Bipolar Disorder, Mania, VS. Schiazoaffective D/O    Plan: continue Depakote and prolixin; dc cogentin and add artane     Altered mental state (03/04/2014)    Assessment: flight of ideas, disorientation, loose associations, hyper alert. Not waxing/ waning, delirium ruled out.    Plan:Treat manic sxs as undrelying cause    Hypertension:  Assessment:Stable  Plan: antihypertensive medications    Hypercholesterolemia:  Assessment: Stable   Plan:lipid  lowering medications and dietary management        In summary, Christy Henry presents with a severe exacerbation of the principal diagnosis, Psychosis, which is worsening/not improving/not stable .    Patient requires continued inpatient hospitalization for further stabilization. I will continue to coordinate the provision of individual, milieu, occupational, group, and substance abuse therapies to address target symptoms/diagnoses as deemed appropriate for the individual patient.     I will continue to monitor blood levels (Depakote, Tegretol, lithium, clozapine---a drug with a narrow therapeutic index= NTI) and associated labs for drug therapy implemented that require intense monitoring for toxicity as deemed appropriate base on current medication side effects and pharmacodynamically determined drug 1/2 lives.       A coordinated, multidisplinary treatment team round was conducted with the patient---this is done daily here at [facility] . This team consists of the nurse, psychiatric unit pharmcist, social worker and Clinical research associate.     The following regarding medications was addressed during rounds with patient:   the risks and benefits of the proposed medication. The patient was given the opportunity to ask questions. Informed consent given to the use of the above medications. Will continue to adjust psychiatric and non-psychiatric medications (see above "medication" section and orders section for details) as deemed appropriate & based upon diagnoses and response to treatment.     I will continue to order blood tests/labs and diagnostic tests as deemed appropriate and review results as they become available (see orders for details and above listed lab/test results).    I will order psychiatric records from previous psych hospitals to further elucidate the nature of patient's psychopathology and review once available.    I will gather additional collateral information from friends, family and  o/p treatment team to further elucidate the nature of patient's psychopathology and baselline level of psychiatric functioning.    Complete current electronic health record for patient has been reviewed today including consultant notes, ancillary staff notes, nurses and psychiatric tech notes    I certify that this patient's inpatient psychiatric hospital services furnished since the previous certification were, and continue to be, required for treatment that could reasonably be expected to improve the patient's condition, or for diagnostic study, and that the patient continues to need, on a daily basis, active treatment furnished directly by or requiring the supervision of inpatient psychiatric facility personnel. In addition, the hospital records show that services furnished were intensive treatment services, admission or related services, or equivalent services.    EXPECTED DISCHARGE DATE/DAY: 3-5 daysTBD     DISPOSITION: Home       Signed By:   Vita Barley, MD  03/09/2014

## 2014-03-10 MED ORDER — TRIHEXYPHENIDYL 2 MG TAB
2 mg | Freq: Two times a day (BID) | ORAL | Status: DC
Start: 2014-03-10 — End: 2014-03-11
  Administered 2014-03-10 – 2014-03-11 (×2): via ORAL

## 2014-03-10 MED FILL — TRIHEXYPHENIDYL 2 MG TAB: 2 mg | ORAL | Qty: 2

## 2014-03-10 MED FILL — DIVALPROEX 500 MG 24 HR TAB: 500 mg | ORAL | Qty: 2

## 2014-03-10 MED FILL — FLUPHENAZINE 5 MG TAB: 5 mg | ORAL | Qty: 2

## 2014-03-10 MED FILL — TRIHEXYPHENIDYL 2 MG TAB: 2 mg | ORAL | Qty: 1

## 2014-03-10 NOTE — Behavioral Health Treatment Team (Signed)
Patient appeared to sleep 8.5+ hours. No further distress noted.

## 2014-03-10 NOTE — Behavioral Health Treatment Team (Signed)
GROUP THERAPY PROGRESS NOTE    Christy Henry is participating in Reflections.     Group time: 30 minutes    Personal goal for participation: unit rules and daily progress    Goal orientation: personal    Group therapy participation: active    Therapeutic interventions reviewed and discussed: yes    Impression of participation: Patient almost led group discussion with her concern regarding side effects of medications she is on. Patient clearly looks improved, with better personal hygiene and increased appropriate interactions with others. Apparently still debating long term benefits of medications.

## 2014-03-10 NOTE — Progress Notes (Signed)
Problem: Altered Thought Process (Adult/Pediatric)  Goal: *STG: Complies with medication therapy  Outcome: Progressing Towards Goal  Pt has been medication compliant.

## 2014-03-10 NOTE — Behavioral Health Treatment Team (Signed)
PSYCHIATRIC PROGRESS NOTE         Patient Name  Christy Henry   Date of Birth [birth year]   CSN [id number]   Medical Record Number  [id number]      Age  43 y.o.   PCP None   Admit date:  03/03/2014    Room Number  736/01  @ [facility]   Date of Service  03/10/2014            PSYCHOTHERAPY SESSION NOTE:  Length of psychotherapy session: 30 minutes  Main condition/diagnosis/issues treated during session today: bipolar disorder symptoms, formal thought disorder and medication compliance    Interpersonal relationship issues and psychodynamic conflicts explored. Supportive psychotherapy provided in regards to various ongoing psychosocial stressors (including some of the following):   pre-admission and current problems   Housing issues   Medical issues   Interpersonal conflicts   Stress of hospitalization              Worked on issues of denial & effects of substance dependency/use  Cognitive/Behavioral therapy provided  Reality-Oriented psychotherapy provided     Overall, patient is not progressing.    An extended energy and skill set was needed to engage pt in psychotherapy due to some of the following: resistiveness, complexity, negativity, confrontational nature, hostile behaviors, and/or severe abnormalities in thought processes/psychosis resulting in the loss of expressive/receptive language communication skills.                      E & M PROGRESS NOTE:         HISTORY       CC/HISTORY OF PRESENT ILLNESS/INTERVAL HISTORY:  (reviewed/updated 03/10/2014).  The patient, Christy Henry, is a 43 y.o. BL[place] female with a past psychiatric history of schizoaffetcive d/o vs bipolar d/o, who presents at this time for an exacerbation of the principle diagnosis of Psychosis. Patient reports/evidences the following emotional symptoms:  agitation and psychotic behavior.  The above symptoms have been present for 2-3 days. These symptoms are of severe severity. The symptoms are  constant in nature.  Additional symptomatology include agitation, anger outbursts and difficulty sleeping . The patient's condition has been precipitated by traveling on the bus from [place] to [place] and "feeling tired" and psychosocial stressors (poor social supports ,, possible homelessness ).  Condition made worse by treatment noncompliance. UDS negative, BAL=0.    03/10/14- Christy Henry continues to improve- she expressed desire to try to comb her hair out. Improving thought processes. Requested Seroquel. Continuing to complain of EPS related shaking.      SIDE EFFECTS: (reviewed/updated 03/10/2014)  None reported or admitted to.  No noted toxicity with use of Depakote   ALLERGIES:(reviewed/updated 03/10/2014)  No Known Allergies   MEDICATIONS PRIOR TO ADMISSION:(reviewed/updated 03/10/2014)  Prescriptions prior to admission   Medication Sig   ??? QUEtiapine (SEROQUEL) 100 mg tablet Take 100 mg by mouth nightly. Indications: SCHIZOPHRENIA      PAST MEDICAL HISTORY: Past medical history from the initial psychiatric evaluation has been reviewed (reviewed/updated 03/10/2014) with no additional updates (I asked patient and no additional past medical history provided).   Past Medical History   Diagnosis Date   ??? Schizo affective schizophrenia (HCC)    History reviewed. No pertinent past surgical history.   SOCIAL HISTORY: Social history from the initial psychiatric evaluation has been reviewed (reviewed/updated 03/10/2014) with no additional updates (I asked patient and no additional social history provided).   History  Social History   ??? Marital Status: MARRIED     Spouse Name: N/A     Number of Children: N/A   ??? Years of Education: N/A     Occupational History   ??? Not on file.     Social History Main Topics   ??? Smoking status: Not on file   ??? Smokeless tobacco: Not on file   ??? Alcohol Use: No   ??? Drug Use: Not on file ??? Sexual Activity: Not on file     Other Topics Concern   ??? Not on file      Social History Narrative    43 year old [place] American female admitted ion a TDO from the bus terminal, where she was wandering in a confused state. She was unable to tell examiners where she had come from nor any details about her history, She had paper work with her from [facility] in [place], [place], where she had been briefly admitted for "schizoaffective disorder". She had been discharged on Seroquel 100 mg po qhs and was given a bus ticket to return to [place]. She is unable to give a reason why she left the bus, which was in transit from [place], [place]- headed [place]. She was uncooperative with the interview and mental status exam, due to thought blocking, paranoia, and being internally preoccupind. She was committed at teh TDO hearing and forced medications were requested,as she refused to take any medications and was quite psychotic.       FAMILY HISTORY: Family history from the initial psychiatric evaluation has been reviewed (reviewed/updated 03/10/2014) with no additional updates (I asked patient and no additional family history provided). History reviewed. No pertinent family history.    REVIEW OF SYSTEMS: (reviewed/updated 03/10/2014)  Appetite:no change from normal   Sleep: decreased more than normal   All other Review of Systems:      Psychological ROS: positive for - hostility and irritability  Cardiovascular ROS: no chest pain or dyspnea on exertion         MENTAL STATUS EXAM & VITALS     MENTAL STATUS EXAM (MSE):    MSE FINDINGS ARE WITHIN NORMAL LIMITS (WNL) UNLESS OTHERWISE STATED BELOW.    Orientation disorganized   Vital Signs (BP,Pulse, Temp) See below (reviewed 03/10/2014); vital signs are WNL if not listed below.   Gait and Station Stable/steady, no ataxia   Abnormal Muscular Movements, Tone, and Behavior No EPS, no Tardive Dyskinesia, no abnormal muscular movements; wnl tone   Relations evasive, guarded and uncooperative    General Appearance:  disheveled, malodorous and older than stated age   Language No aphasia or dysarthria   Speech:  hyperverbal, loud and pressured   Thought Processes disorganized   Thought Associations flight of ideas and loose associations   Thought Content preoccupations   Suicidal Ideations no plan  and no intention   Homicidal Ideations no plan  and no intention   Mood:  hostile and irritable   Affect:  full range   Memory recent  adequate   Memory remote:  adequate   Concentration/Attention:  adequate   Fund of Knowledge average   Insight:  poor   Reliability poor   Judgment:  poor        VITALS:     Patient Vitals for the past 24 hrs:   Temp Pulse Resp BP SpO2   03/10/14 1950 98 ??F (36.7 ??C) 65 16 142/65 mmHg -   03/10/14 0730 97.5 ??F (36.4 ??C) 67  16 115/66 mmHg 100 %            DATA     LABORATORY DATA:(reviewed/updated 03/10/2014)  No results found for this or any previous visit (from the past 24 hour(s)).  No results found for: VALF2, VALAC, VALP, VALPR, DS6, CRBAM, CRBAMP, CARB2, XCRBAM  No results found for: LI, LIH, LITHM   RADIOLOGY REPORTS:(reviewed/updated 03/10/2014)  No results found.       MEDICATIONS     ALL MEDICATIONS:   Current Facility-Administered Medications   Medication Dose Route Frequency   ??? trihexyphenidyl (ARTANE) tablet 3 mg  3 mg Oral BID   ??? divalproex ER (DEPAKOTE ER) 24 hour tablet 1000mg  ++++++++Court ordered medication+++++++  1,000 mg Oral QHS    Or   ??? LORazepam (ATIVAN) injection 1mg  +++++++ Court Ordered Medication +++++++++  1 mg IntraMUSCular QHS   ??? fluPHENAZine (PROLIXIN) tablet 10 mg+++++Court ordered medication+++++  10 mg Oral BID    Or   ??? fluPHENAZine (PROLIXIN) injection 5 mg  5 mg IntraMUSCular BID   ??? ziprasidone (GEODON) 20 mg in sterile water (preservative free) injection  20 mg IntraMUSCular Q4H PRN   ??? OLANZapine (ZyPREXA) tablet 5 mg  5 mg Oral Q6H PRN   ??? LORazepam (ATIVAN) injection 2 mg  2 mg IntraMUSCular Q4H PRN    ??? zolpidem (AMBIEN) tablet 5 mg  5 mg Oral QHS PRN   ??? ibuprofen (MOTRIN) tablet 400 mg  400 mg Oral Q4H PRN   ??? magnesium hydroxide (MILK OF MAGNESIA) oral suspension 30 mL  30 mL Oral DAILY PRN   ??? nicotine (NICODERM CQ) 21 mg/24 hr patch 1 Patch  1 Patch TransDERmal DAILY PRN      SCHEDULED MEDICATIONS:   Current Facility-Administered Medications   Medication Dose Route Frequency   ??? trihexyphenidyl (ARTANE) tablet 3 mg  3 mg Oral BID   ??? divalproex ER (DEPAKOTE ER) 24 hour tablet 1000mg  ++++++++Court ordered medication+++++++  1,000 mg Oral QHS    Or   ??? LORazepam (ATIVAN) injection 1mg  +++++++ Court Ordered Medication +++++++++  1 mg IntraMUSCular QHS   ??? fluPHENAZine (PROLIXIN) tablet 10 mg+++++Court ordered medication+++++  10 mg Oral BID    Or   ??? fluPHENAZine (PROLIXIN) injection 5 mg  5 mg IntraMUSCular BID            ASSESSMENT & PLAN     The patient, Christy Henry, is a 43 y.o.  female who presents at this time for treatment of the following diagnoses: (reviewed/updated 03/10/2014)  Patient Active Hospital Problem List:   Psychosis (03/04/2014)    Assessment: disorganized thinking with formal thought disorder occurring in the context of labile affect, pressured speech and irritable mood. - Bipolar Disorder, Mania, VS. Schiazoaffective D/O    Plan: continue Depakote and prolixin; dc cogentin and increase Artane to 3mg  three times daily     Altered mental state (03/04/2014)    Assessment: flight of ideas, disorientation, loose associations, hyper alert. Not waxing/ waning, delirium ruled out.    Plan:Treat manic sxs as undrelying cause    Hypertension:  Assessment:Stable  Plan: antihypertensive medications    Hypercholesterolemia:  Assessment: Stable  Plan:lipid lowering medications and dietary management        In summary, Christy Henry presents with a severe exacerbation of the principal diagnosis, Psychosis, which is worsening/not improving/not stable .     Patient requires continued inpatient hospitalization for further stabilization. I will continue to coordinate the provision of individual, milieu,  occupational, group, and substance abuse therapies to address target symptoms/diagnoses as deemed appropriate for the individual patient.     I will continue to monitor blood levels (Depakote, Tegretol, lithium, clozapine---a drug with a narrow therapeutic index= NTI) and associated labs for drug therapy implemented that require intense monitoring for toxicity as deemed appropriate base on current medication side effects and pharmacodynamically determined drug 1/2 lives.       A coordinated, multidisplinary treatment team round was conducted with the patient---[facility] . This team consists of the nurse, psychiatric unit pharmcist, social worker and Clinical research associatewriter.     The following regarding medications was addressed during rounds with patient:   the risks and benefits of the proposed medication. The patient was given the opportunity to ask questions. Informed consent given to the use of the above medications. Will continue to adjust psychiatric and non-psychiatric medications (see above "medication" section and orders section for details) as deemed appropriate & based upon diagnoses and response to treatment.     I will continue to order blood tests/labs and diagnostic tests as deemed appropriate and review results as they become available (see orders for details and above listed lab/test results).    I will order psychiatric records from previous psych hospitals to further elucidate the nature of patient's psychopathology and review once available.    I will gather additional collateral information from friends, family and o/p treatment team to further elucidate the nature of patient's psychopathology and baselline level of psychiatric functioning.    Complete current electronic health record for patient has been reviewed  today including consultant notes, ancillary staff notes, nurses and psychiatric tech notes    I certify that this patient's inpatient psychiatric hospital services furnished since the previous certification were, and continue to be, required for treatment that could reasonably be expected to improve the patient's condition, or for diagnostic study, and that the patient continues to need, on a daily basis, active treatment furnished directly by or requiring the supervision of inpatient psychiatric facility personnel. In addition, the hospital records show that services furnished were intensive treatment services, admission or related services, or equivalent services.    EXPECTED DISCHARGE DATE/DAY: 3-5 daysTBD     DISPOSITION: Home       Signed By:   Vita Barleyashida N. Tulio Facundo, MD  03/10/2014

## 2014-03-10 NOTE — Behavioral Health Treatment Team (Signed)
Quiet resting in bed isolating no complaints. Compliant meals and meds. Continue q 15 checks safety
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
## 2014-03-10 NOTE — Behavioral Health Treatment Team (Signed)
Pt received up in her room pacing around. Pt in no acute distress. Staff will continue to monitor every 15 minutes for safety.

## 2014-03-10 NOTE — Progress Notes (Signed)
GROUP THERAPY PROGRESS NOTE    Christy Henry is participating in Process Group.     Group time: 45 minutes    Group therapy participation:        Did not attend group today.

## 2014-03-10 NOTE — Progress Notes (Signed)
03/10/14 1700   Vital Signs   Temp (refused)   Pulse (Heart Rate) (refused)   Resp Rate (refused)   O2 Sat (%) (refused)   Level of Consciousness Alert   BP (refused)   Patient Observation   Observations nad   Hourly Rounds Yes   Pt refused to allow staff to check her vital signs.  No MEWS score generated.

## 2014-03-11 LAB — CBC W/O DIFF
HCT: 30.2 % — ABNORMAL LOW (ref 35.0–47.0)
HGB: 9.6 g/dL — ABNORMAL LOW (ref 11.5–16.0)
MCH: 24.1 PG — ABNORMAL LOW (ref 26.0–34.0)
MCHC: 31.8 g/dL (ref 30.0–36.5)
MCV: 75.9 FL — ABNORMAL LOW (ref 80.0–99.0)
PLATELET: 362 10*3/uL (ref 150–400)
RBC: 3.98 M/uL (ref 3.80–5.20)
RDW: 17.5 % — ABNORMAL HIGH (ref 11.5–14.5)
WBC: 9 10*3/uL (ref 3.6–11.0)

## 2014-03-11 LAB — HEPATIC FUNCTION PANEL
A-G Ratio: 0.7 — ABNORMAL LOW (ref 1.1–2.2)
ALT (SGPT): 12 U/L (ref 12–78)
AST (SGOT): 6 U/L — ABNORMAL LOW (ref 15–37)
Albumin: 3 g/dL — ABNORMAL LOW (ref 3.5–5.0)
Alk. phosphatase: 79 U/L (ref 45–117)
Bilirubin, direct: <0.1 MG/DL (ref 0.0–0.2)
Bilirubin, total: 0.2 MG/DL (ref 0.2–1.0)
Globulin: 4.2 g/dL — ABNORMAL HIGH (ref 2.0–4.0)
Protein, total: 7.2 g/dL (ref 6.4–8.2)

## 2014-03-11 LAB — VALPROIC ACID: Valproic acid: 97 ug/ml (ref 50–100)

## 2014-03-11 MED ORDER — BENZTROPINE 1 MG TAB
1 mg | ORAL_TABLET | Freq: Two times a day (BID) | ORAL | Status: DC
Start: 2014-03-11 — End: 2014-03-11

## 2014-03-11 MED ORDER — BENZTROPINE 1 MG TAB
1 mg | Freq: Two times a day (BID) | ORAL | Status: DC
Start: 2014-03-11 — End: 2014-03-11

## 2014-03-11 MED ORDER — BENZTROPINE 2 MG TAB
2 mg | ORAL_TABLET | Freq: Two times a day (BID) | ORAL | Status: AC
Start: 2014-03-11 — End: ?

## 2014-03-11 MED ORDER — BENZTROPINE 2 MG TAB
2 mg | Freq: Two times a day (BID) | ORAL | Status: DC
Start: 2014-03-11 — End: 2014-03-12
  Administered 2014-03-12: 13:00:00 via ORAL

## 2014-03-11 MED ORDER — TRIHEXYPHENIDYL 2 MG TAB
2 mg | ORAL_TABLET | Freq: Two times a day (BID) | ORAL | Status: DC
Start: 2014-03-11 — End: 2014-03-11

## 2014-03-11 MED ORDER — DIVALPROEX 500 MG 24 HR TAB
500 mg | ORAL_TABLET | Freq: Every evening | ORAL | Status: AC
Start: 2014-03-11 — End: ?

## 2014-03-11 MED ORDER — TRIHEXYPHENIDYL 2 MG TAB
2 mg | Freq: Two times a day (BID) | ORAL | Status: DC
Start: 2014-03-11 — End: 2014-03-11

## 2014-03-11 MED ORDER — FLUPHENAZINE 10 MG TAB
10 mg | ORAL_TABLET | Freq: Two times a day (BID) | ORAL | Status: AC
Start: 2014-03-11 — End: ?

## 2014-03-11 MED FILL — BENZTROPINE 2 MG TAB: 2 mg | ORAL | Qty: 1

## 2014-03-11 MED FILL — TRIHEXYPHENIDYL 2 MG TAB: 2 mg | ORAL | Qty: 2

## 2014-03-11 MED FILL — FLUPHENAZINE 5 MG TAB: 5 mg | ORAL | Qty: 2

## 2014-03-11 MED FILL — BENZTROPINE 1 MG TAB: 1 mg | ORAL | Qty: 1

## 2014-03-11 MED FILL — DIVALPROEX 500 MG 24 HR TAB: 500 mg | ORAL | Qty: 2

## 2014-03-11 MED FILL — TRIHEXYPHENIDYL 2 MG TAB: 2 mg | ORAL | Qty: 1

## 2014-03-11 NOTE — Behavioral Health Treatment Team (Signed)
PSYCHIATRIC PROGRESS NOTE         Patient Name  Christy Henry   Date of Birth Feb 26, 1971   CSN [id number]   Medical Record Number  [id number]      Age  43 y.o.   PCP None   Admit date:  03/03/2014    Room Number  736/01  @ [facility]   Date of Service  03/11/2014            PSYCHOTHERAPY SESSION NOTE:  Length of psychotherapy session: 30 minutes  Main condition/diagnosis/issues treated during session today: bipolar disorder symptoms, formal thought disorder and medication compliance    Interpersonal relationship issues and psychodynamic conflicts explored. Supportive psychotherapy provided in regards to various ongoing psychosocial stressors (including some of the following):   pre-admission and current problems   Housing issues   Medical issues   Interpersonal conflicts   Stress of hospitalization              Worked on issues of denial & effects of substance dependency/use  Cognitive/Behavioral therapy provided  Reality-Oriented psychotherapy provided     Overall, patient is not progressing.    An extended energy and skill set was needed to engage pt in psychotherapy due to some of the following: resistiveness, complexity, negativity, confrontational nature, hostile behaviors, and/or severe abnormalities in thought processes/psychosis resulting in the loss of expressive/receptive language communication skills.                      E & M PROGRESS NOTE:         HISTORY       CC/HISTORY OF PRESENT ILLNESS/INTERVAL HISTORY:  (reviewed/updated 03/11/2014).  The patient, Christy Henry, is a 43 y.o. BLACK OR A[place] AMER[place] female with a past psychiatric history of schizoaffetcive d/o vs bipolar d/o, who presents at this time for an exacerbation of the principle diagnosis of Psychosis. Patient reports/evidences the following emotional symptoms:  agitation and psychotic behavior.  The above symptoms have been present for 2-3 days. These symptoms are of severe severity. The symptoms are  constant in nature.  Additional symptomatology include agitation, anger outbursts and difficulty sleeping . The patient's condition has been precipitated by traveling on the bus from [place] to [place] and "feeling tired" and psychosocial stressors (poor social supports ,, possible homelessness ).  Condition made worse by treatment noncompliance. UDS negative, BAL=0.    03/10/14- Ms. Reed continues to improve- she expressed desire to try to comb her hair out. Improving thought processes. Requested Seroquel. Continuing to complain of EPS related shaking.    03/11/14- Pt. Denies SI/HI. Mood is neutral, with goal directed thinking. She has had no behavioral outbursts or required PRN medication. She is very future oriented to return to [place] accompanied by her treatment team. She is not a significant risk for violence,agitation, disruptiveness, or aggression during the car ride home.      SIDE EFFECTS: (reviewed/updated 03/11/2014)  None reported or admitted to.  No noted toxicity with use of Depakote   ALLERGIES:(reviewed/updated 03/11/2014)  No Known Allergies   MEDICATIONS PRIOR TO ADMISSION:(reviewed/updated 03/11/2014)  Prescriptions prior to admission   Medication Sig   ??? QUEtiapine (SEROQUEL) 100 mg tablet Take 100 mg by mouth nightly. Indications: SCHIZOPHRENIA      PAST MEDICAL HISTORY: Past medical history from the initial psychiatric evaluation has been reviewed (reviewed/updated 03/11/2014) with no additional updates (I asked patient and no additional past medical history provided).   Past Medical  History   Diagnosis Date   ??? Schizo affective schizophrenia ([place])    History reviewed. No pertinent past surgical history.   SOCIAL HISTORY: Social history from the initial psychiatric evaluation has been reviewed (reviewed/updated 03/11/2014) with no additional updates (I asked patient and no additional social history provided).   History     Social History   ??? Marital Status: MARRIED      Spouse Name: N/A     Number of Children: N/A   ??? Years of Education: N/A     Occupational History   ??? Not on file.     Social History Main Topics   ??? Smoking status: Not on file   ??? Smokeless tobacco: Not on file   ??? Alcohol Use: No   ??? Drug Use: Not on file   ??? Sexual Activity: Not on file     Other Topics Concern   ??? Not on file     Social History Narrative    43 year old [place] American female admitted ion a TDO from the bus terminal, where she was wandering in a confused state. She was unable to tell examiners where she had come from nor any details about her history, She had paper work with her from [facility] in [place], [place], where she had been briefly admitted for "schizoaffective disorder". She had been discharged on Seroquel 100 mg po qhs and was given a bus ticket to return to [place]. She is unable to give a reason why she left the bus, which was in transit from [place], [place]. She was uncooperative with the interview and mental status exam, due to thought blocking, paranoia, and being internally preoccupind. She was committed at teh TDO hearing and forced medications were requested,as she refused to take any medications and was quite psychotic.       FAMILY HISTORY: Family history from the initial psychiatric evaluation has been reviewed (reviewed/updated 03/11/2014) with no additional updates (I asked patient and no additional family history provided). History reviewed. No pertinent family history.    REVIEW OF SYSTEMS: (reviewed/updated 03/11/2014)  Appetite:no change from normal   Sleep: decreased more than normal   All other Review of Systems:      Psychological ROS: positive for - hostility and irritability  Cardiovascular ROS: no chest pain or dyspnea on exertion         [place] (MSE):    MSE FINDINGS ARE WITHIN NORMAL LIMITS (WNL) UNLESS OTHERWISE STATED BELOW.    Orientation disorganized    Vital Signs (BP,Pulse, Temp) See below (reviewed 03/11/2014); vital signs are WNL if not listed below.   Gait and Station Stable/steady, no ataxia   Abnormal Muscular Movements, Tone, and Behavior No EPS, no Tardive Dyskinesia, no abnormal muscular movements; wnl tone   Relations evasive, guarded and uncooperative   General Appearance:  disheveled, malodorous and older than stated age   Language No aphasia or dysarthria   Speech:  hyperverbal, loud and pressured   Thought Processes disorganized   Thought Associations flight of ideas and loose associations   Thought Content preoccupations   Suicidal Ideations no plan  and no intention   Homicidal Ideations no plan  and no intention   Mood:  hostile and irritable   Affect:  full range   Memory recent  adequate   Memory remote:  adequate   Concentration/Attention:  adequate   Fund of Knowledge average  Insight:  poor   Reliability poor   Judgment:  poor        VITALS:     Patient Vitals for the past 24 hrs:   Temp Pulse Resp BP SpO2   03/11/14 0750 98.2 ??F (36.8 ??C) 67 16 114/63 mmHg 98 %   03/10/14 1950 98 ??F (36.7 ??C) 65 16 142/65 mmHg -            DATA     LABORATORY DATA:(reviewed/updated 03/11/2014)  Recent Results (from the past 24 hour(s))   VALPROIC ACID    Collection Time: 03/11/14  6:13 AM   Result Value Ref Range    Valproic acid 97 50 - 100 ug/ml   CBC W/O DIFF    Collection Time: 03/11/14  6:13 AM   Result Value Ref Range    WBC 9.0 3.6 - 11.0 K/uL  RBC 3.98 3.80 - 5.20 M/uL    HGB 9.6 (L) 11.5 - 16.0 g/dL    HCT 30.2 (L) 35.0 - 47.0 %    MCV 75.9 (L) 80.0 - 99.0 FL    MCH 24.1 (L) 26.0 - 34.0 PG    MCHC 31.8 30.0 - 36.5 g/dL    RDW 17.5 (H) 11.5 - 14.5 %    PLATELET 362 150 - 400 K/uL   HEPATIC FUNCTION PANEL    Collection Time: 03/11/14  6:13 AM   Result Value Ref Range    Protein, total 7.2 6.4 - 8.2 g/dL    Albumin 3.0 (L) 3.5 - 5.0 g/dL    Globulin 4.2 (H) 2.0 - 4.0 g/dL    A-G Ratio 0.7 (L) 1.1 - 2.2      Bilirubin, total 0.2 0.2 - 1.0 MG/DL     Bilirubin, direct <0.1 0.0 - 0.2 MG/DL    Alk. phosphatase 79 45 - 117 U/L    AST 6 (L) 15 - 37 U/L    ALT 12 12 - 78 U/L     Lab Results   Component Value Date/Time    VALPROIC ACID 97 03/11/2014 06:13 AM     No results found for: LI, LIH, LITHM   RADIOLOGY REPORTS:(reviewed/updated 03/11/2014)  No results found.       MEDICATIONS     ALL MEDICATIONS:   Current Facility-Administered Medications   Medication Dose Route Frequency ??? benztropine (COGENTIN) tablet 1 mg  1 mg Oral BID   ??? trihexyphenidyl (ARTANE) tablet 2 mg  2 mg Oral BID   ??? divalproex ER (DEPAKOTE ER) 24 hour tablet 1021m ++++++++Court ordered medication+++++++  1,000 mg Oral QHS    Or   ??? LORazepam (ATIVAN) injection 123m+++++++ Court Ordered Medication +++++++++  1 mg IntraMUSCular QHS   ??? fluPHENAZine (PROLIXIN) tablet 10 mg+++++Court ordered medication+++++  10 mg Oral BID    Or   ??? fluPHENAZine (PROLIXIN) injection 5 mg  5 mg IntraMUSCular BID   ??? ziprasidone (GEODON) 20 mg in sterile water (preservative free) injection  20 mg IntraMUSCular Q4H PRN   ??? OLANZapine (ZyPREXA) tablet 5 mg  5 mg Oral Q6H PRN   ??? LORazepam (ATIVAN) injection 2 mg  2 mg IntraMUSCular Q4H PRN   ??? zolpidem (AMBIEN) tablet 5 mg  5 mg Oral QHS PRN   ??? ibuprofen (MOTRIN) tablet 400 mg  400 mg Oral Q4H PRN   ??? magnesium hydroxide (MILK OF MAGNESIA) oral suspension 30 mL  30 mL Oral DAILY PRN   ??? nicotine (NICODERM CQ) 21 mg/24 hr patch  1 Patch  1 Patch TransDERmal DAILY PRN      SCHEDULED MEDICATIONS:   Current Facility-Administered Medications   Medication Dose Route Frequency   ??? benztropine (COGENTIN) tablet 1 mg  1 mg Oral BID   ??? trihexyphenidyl (ARTANE) tablet 2 mg  2 mg Oral BID   ??? divalproex ER (DEPAKOTE ER) 24 hour tablet 1042m ++++++++Court ordered medication+++++++  1,000 mg Oral QHS    Or   ??? LORazepam (ATIVAN) injection 128m+++++++ Court Ordered Medication +++++++++  1 mg IntraMUSCular QHS    ??? fluPHENAZine (PROLIXIN) tablet 10 mg+++++Court ordered medication+++++  10 mg Oral BID    Or   ??? fluPHENAZine (PROLIXIN) injection 5 mg  5 mg IntraMUSCular BID            ASSESSMENT & PLAN     The patient, ErTana Conchis a [age].o.  female who presents at this time for treatment of the following diagnoses: (reviewed/updated 03/11/2014)  [facility]   Psychosis (03/04/2014)    Assessment: disorganized thinking with formal thought disorder occurring in the context of labile affect, pressured speech and irritable mood. - Bipolar Disorder, Mania, VS. Schiazoaffective D/O    Plan: continue Depakote and prolixin; dc cogentin and increase Artane to 51m65mhree times daily  03/11/14- [place]. There is no significant risk of aggression, agitation, or behavioral disruptiveness.     Altered mental state (03/04/2014)    Assessment: flight of ideas, disorientation, loose associations, hyper alert. Not waxing/ waning, delirium ruled out.    Plan:Treat manic sxs as undrelying cause    Hypertension:  Assessment:Stable  Plan: antihypertensive medications    Hypercholesterolemia:  Assessment: Stable  Plan:lipid lowering medications and dietary management        In summary, EriTana Conchesents with a severe exacerbation of the principal diagnosis, Psychosis, which is worsening/not improving/not stable .    Patient requires continued inpatient hospitalization for further stabilization. I will continue to coordinate the provision of individual, milieu, occupational, group, and substance abuse therapies to address target symptoms/diagnoses as deemed appropriate for the individual patient.     I will continue to monitor blood levels (Depakote, Tegretol, lithium, clozapine---a drug with a narrow therapeutic index= NTI) and associated labs for drug therapy implemented that require intense monitoring for  toxicity as deemed appropriate base on current medication side effects and pharmacodynamically determined drug 1/2 lives.       A coordinated, multidisplinary treatment team round was conducted with the patient---[facility] nurse, psychiatric unit pharmcist, social worker and wriProbation officer   The following regarding medications was addressed during rounds with patient:   the risks and benefits of the proposed medication. The patient was given the opportunity to ask questions. Informed consent given to the use of the above medications. Will continue to adjust psychiatric and non-psychiatric medications (see above "medication" section and orders section for details) as deemed appropriate & based upon diagnoses and response to treatment.     I will continue to order blood tests/labs and diagnostic tests as deemed appropriate and review results as they become available (see orders for details and above listed lab/test results).    I will order psychiatric records from previous psych hospitals to further elucidate the nature of patient's psychopathology and review once available.    I will gather additional collateral information from friends, family  and o/p treatment team to further elucidate the nature of patient's psychopathology and baselline level of psychiatric functioning.    Complete current electronic health record for patient has been reviewed today including consultant notes, ancillary staff notes, nurses and psychiatric tech notes    I certify that this patient's inpatient psychiatric hospital services furnished since the previous certification were, and continue to be, required for treatment that could reasonably be expected to improve the patient's condition, or for diagnostic study, and that the patient continues to need, on a daily basis, active treatment furnished directly by or requiring the supervision of inpatient psychiatric facility  personnel. In addition, the hospital records show that services furnished were intensive treatment services, admission or related services, or equivalent services.    EXPECTED DISCHARGE DATE/DAY: 3-5 daysTBD     DISPOSITION: Home       Signed By:   Isac Caddy, MD  03/11/2014

## 2014-03-11 NOTE — Progress Notes (Signed)
03/11/14 1600   Vital Signs   Temp (refused)   Pulse (Heart Rate) (refused)   Resp Rate (refused)   Level of Consciousness Alert   BP (refused)

## 2014-03-11 NOTE — Progress Notes (Signed)
Problem: Altered Thought Process (Adult/Pediatric)  Goal: *STG: Participates in treatment plan  Outcome: Progressing Towards Goal  Review meds, discussed about adjustments such artane. Noted slight tremor in upper and lower arms and head when at rest. Pt verbalized understanding of tremor and benefits of medications. Thoughts organized, affect flat however noted brighter when engaged. Reports feeling tired. Does not appear internally stimulated at times. Denies SI. Engaged with staff this am. Staff will continue to monitor and encourage group participation.

## 2014-03-11 NOTE — Behavioral Health Treatment Team (Signed)
Received pt laying awake in bed during shift change. Pt does acknowledge oncoming shift, no acute distress noted at this time. Staff will continue to monitor q 15 min checks.

## 2014-03-11 NOTE — Behavioral Health Treatment Team (Signed)
2315 Received patient appears to sleep. NAD noted. Will continue to monitor patient for safety q15 minutes.

## 2014-03-11 NOTE — Progress Notes (Signed)
GROUP THERAPY PROGRESS NOTE    Christy Henry is participating in Process Group.     Group time: 45 minutes    Group therapy participation:        Did not attend group today.

## 2014-03-11 NOTE — Behavioral Health Treatment Team (Signed)
Patient appeared to sleep 8 hours. Cooperative with lab draw.

## 2014-03-11 NOTE — Behavioral Health Treatment Team (Signed)
Pt refuses scheduled cogentin.

## 2014-03-11 NOTE — Progress Notes (Signed)
03/11/14 2115   Vital Signs   Temp (refused)   Pulse (Heart Rate) (refused)   Level of Consciousness Alert   BP (refused)

## 2014-03-11 NOTE — Behavioral Health Treatment Team (Signed)
Did not attend Community Group.

## 2014-03-11 NOTE — Behavioral Health Treatment Team (Signed)
Pt refused Cogentin 1 mg as ordered. Stated "it does not work for me".  Pt stated nothing works for her "except not to be on the medications. Will continue to monitor.

## 2014-03-12 MED FILL — DIVALPROEX 500 MG 24 HR TAB: 500 mg | ORAL | Qty: 2

## 2014-03-12 MED FILL — BENZTROPINE 2 MG TAB: 2 mg | ORAL | Qty: 1

## 2014-03-12 MED FILL — FLUPHENAZINE 5 MG TAB: 5 mg | ORAL | Qty: 2

## 2014-03-12 NOTE — Progress Notes (Signed)
Laboratory Monitoring for Atypical Antipsychotics and Mood Stabilizers    This patient is currently prescribed the following medication(s):   Current Facility-Administered Medications   Medication Dose Route Frequency   ??? benztropine (COGENTIN) tablet 2 mg  2 mg Oral BID   ??? divalproex ER (DEPAKOTE ER) 24 hour tablet 1074m ++++++++Court ordered medication+++++++  1,000 mg Oral QHS    Or   ??? LORazepam (ATIVAN) injection 152m+++++++ Court Ordered Medication +++++++++  1 mg IntraMUSCular QHS   ??? fluPHENAZine (PROLIXIN) tablet 10 mg+++++Court ordered medication+++++  10 mg Oral BID    Or   ??? fluPHENAZine (PROLIXIN) injection 5 mg  5 mg IntraMUSCular BID       The following labs have been completed for monitoring of valproic acid:    Valproic Acid Serum Concentration  Lab Results   Component Value Date/Time    VALPROIC ACID 97 03/11/2014 06:13 AM         Hepatic Function  Lab Results   Component Value Date/Time    BILIRUBIN, TOTAL 0.2 03/11/2014 06:13 AM    PROTEIN, TOTAL 7.2 03/11/2014 06:13 AM    ALBUMIN 3.0 03/11/2014 06:13 AM    GLOBULIN 4.2 03/11/2014 06:13 AM    A-G RATIO 0.7 03/11/2014 06:13 AM    ALT 12 03/11/2014 06:13 AM    ALK. PHOSPHATASE 79 03/11/2014 06:13 AM       Hematology  Lab Results   Component Value Date/Time    WBC 9.0 03/11/2014 06:13 AM    RBC 3.98 03/11/2014 06:13 AM    HGB 9.6 03/11/2014 06:13 AM    HCT 30.2 03/11/2014 06:13 AM    MCV 75.9 03/11/2014 06:13 AM    MCH 24.1 03/11/2014 06:13 AM    MCHC 31.8 03/11/2014 06:13 AM    RDW 17.5 03/11/2014 06:13 AM    PLATELET 362 03/11/2014 06:13 AM       Assessment/Plan:  Labs obtained on 11/23 all WNL. Valproic acid level therapeutic at 97. No further labs needed at this time.      ErOrland JarredPharmD Candidate [id number] [phone]

## 2014-03-12 NOTE — Discharge Summary (Signed)
Marland Kitchen    PSYCHIATRIC DISCHARGE SUMMARY         IDENTIFICATION:    Patient Name  Christy Henry   Date of Birth 24-Jul-1970   CSN [id number]   Medical Record Number  [id number]      Age  43 y.o.   PCP None   Admit date:  03/03/2014    Discharge date: 03/12/2014   Room Number  736/01  @ [facility]   Date of Service  03/12/2014               TYPE OF DISCHARGE: REGULAR               CONDITION AT DISCHARGE: good, improved and stable       PROVISIONAL & DISCHARGE DIAGNOSES:    Problem List  Date Reviewed: 03-16-2014        Codes Class    * (Principal)Psychosis ICD-10-CM:  F29  ICD-9-CM:  298.9         Altered mental state ICD-10-CM:  R41.82  ICD-9-CM:  780.97               Active Hospital Problems    *Psychosis      Altered mental state        DISCHARGE DIAGNOSIS:   Axis I:  SEE ABOVE  Axis II: SEE ABOVE  Axis III: SEE ABOVE  Axis IV:  lack of structure  Axis V:  50 on admission, 70 on discharge 70 (baseline)       CC & HISTORY OF PRESENT ILLNESS:  43 year old single African American female admitted for acute disorganization and poor self care. Pt. Had been traveling away from home, and her treatment team, when she decompensated. Pt. Was restarted on Depakote and her antipsychotic medication. She responded well to this treatment, with symptom resolution. She was started on cogentin for tremor. The pt.'s PACT team was contacted in [facility], and they came to [facility] to drive her back to [place]. At discharge she denied SI/HI intent and plan and was future oriented.     SOCIAL HISTORY:    History     Social History   ??? Marital Status: MARRIED   Spouse Name: N/A     Number of Children: N/A   ??? Years of Education: N/A     Occupational History   ??? Not on file.     Social History Main Topics   ??? Smoking status: Not on file   ??? Smokeless tobacco: Not on file ??? Alcohol Use: No   ??? Drug Use: Not on file   ??? Sexual Activity: Not on file     Other Topics Concern   ??? Not on file     Social History Narrative     43 year old [place] American female admitted ion a TDO from the bus terminal, where she was wandering in a confused state. She was unable to tell examiners where she had come from nor any details about her history, She had paper work with her from [facility] in [place], [place], where she had been briefly admitted for "schizoaffective disorder". She had been discharged on Seroquel 100 mg po qhs and was given a bus ticket to return to [place]. She is unable to give a reason why she left the bus, which was in transit from [place], [place]. She was uncooperative with the interview and mental status exam, due to thought blocking, paranoia, and being internally preoccupind. She was committed  at teh TDO hearing and forced medications were requested,as she refused to take any medications and was quite psychotic.       FAMILY HISTORY:   History reviewed. No pertinent family history.          HOSPITALIZATION COURSE:    Christy Henry was admitted to the inpatient psychiatric unit [facility] hospital for acute psychiatric stabilization in regards to symptomatology as described in the HPI above. The differential diagnosis at time of admission included: bipolar dis vs. schizoaffective vs. Schizophrenia.  While on the unit Christy Henry was involved in individual, group, occupational and milieu therapy.  Psychiatric medications were adjusted during this hospitalization including Depakote.   Christy Henry demonstrated a slow, but progressive improvement in overall condition.  Much of patient's depression appeared to be related to situational stressors and psychological factors.  Please see individual progress notes for more specific details regarding patient's hospitalization course.   At time of dc, Christy Henry was without significant problems with depression psychosis  mania.  Overall presentation at time of discharge is most consistent with the diagnosis of bipolar disorder Patient with request for  discharge today. There are no grounds to seek a TDO. Patient has maximized benefit to be derived from acute inpatient psychiatric treatment.  All members of the treatment team concur with each other in regards to plans for discharge today per patient's request.          LABS AND IMAGAING:    Labs Reviewed   CBC WITH AUTOMATED DIFF - Abnormal; Notable for the following:     HGB 9.4 (*)     HCT 30.1 (*)     MCV 76.8 (*)     MCH 24.0 (*)     RDW 17.7 (*)     ABS. LYMPHOCYTES 3.7 (*)     All other components within normal limits   METABOLIC PANEL, COMPREHENSIVE - Abnormal; Notable for the following:     BUN/Creatinine ratio 7 (*)     AST 13 (*)     Protein, total 8.3 (*)     Globulin 4.5 (*)     A-G Ratio 0.8 (*)     All other components within normal limits   URINALYSIS W/ REFLEX CULTURE - Abnormal; Notable for the following:     Epithelial cells MODERATE (*)     Bacteria 1+ (*)     UA:UC IF INDICATED URINE CULTURE ORDERED (*)     Mucus 2+ (*)     All other components within normal limits   SALICYLATE - Abnormal; Notable for the following:     SALICYLATE <7.1 (*)     All other components within normal limits   ACETAMINOPHEN - Abnormal; Notable for the following:     ACETAMINOPHEN <2 (*)     All other components within normal limits   CBC W/O DIFF - Abnormal; Notable for the following:     HGB 9.6 (*)     HCT 30.2 (*)     MCV 75.9 (*)     MCH 24.1 (*)     RDW 17.5 (*)     All other components within normal limits   HEPATIC FUNCTION PANEL - Abnormal; Notable for the following:     Albumin 3.0 (*)     Globulin 4.2 (*)     A-G Ratio 0.7 (*)     AST 6 (*)     All other components within normal limits   CULTURE, URINE   ETHYL ALCOHOL  DRUG SCREEN, URINE   SAMPLES BEING HELD   TSH, 3RD GENERATION   VALPROIC ACID   HCG URINE, QL. - POC     Admission on 03/03/2014, Discharged on 03/12/2014   Component Date Value Ref Range Status   ??? WBC 03/03/2014 10.7  3.6 - 11.0 K/uL Final   ??? RBC 03/03/2014 3.92  3.80 - 5.20 M/uL Final    ??? HGB 03/03/2014 9.4* 11.5 - 16.0 g/dL Final   ??? HCT 03/03/2014 30.1* 35.0 - 47.0 % Final   ??? MCV 03/03/2014 76.8* 80.0 - 99.0 FL Final   ??? MCH 03/03/2014 24.0* 26.0 - 34.0 PG Final   ??? MCHC 03/03/2014 31.2  30.0 - 36.5 g/dL Final   ??? RDW 03/03/2014 17.7* 11.5 - 14.5 % Final   ??? PLATELET 03/03/2014 366  150 - 400 K/uL Final   ??? NEUTROPHILS 03/03/2014 58  32 - 75 % Final   ??? LYMPHOCYTES 03/03/2014 35  12 - 49 % Final   ??? MONOCYTES 03/03/2014 5  5 - 13 % Final   ??? EOSINOPHILS 03/03/2014 1  0 - 7 % Final   ??? BASOPHILS 03/03/2014 1  0 - 1 % Final   ??? ABS. NEUTROPHILS 03/03/2014 6.3  1.8 - 8.0 K/UL Final   ??? ABS. LYMPHOCYTES 03/03/2014 3.7* 0.8 - 3.5 K/UL Final   ??? ABS. MONOCYTES 03/03/2014 0.6  0.0 - 1.0 K/UL Final   ??? ABS. EOSINOPHILS 03/03/2014 0.1  0.0 - 0.4 K/UL Final   ??? ABS. BASOPHILS 03/03/2014 0.1  0.0 - 0.1 K/UL Final   ??? Sodium 03/03/2014 139  136 - 145 mmol/L Final   ??? Potassium 03/03/2014 3.6  3.5 - 5.1 mmol/L Final   ??? Chloride 03/03/2014 105  97 - 108 mmol/L Final   ??? CO2 03/03/2014 29  21 - 32 mmol/L Final   ??? Anion gap 03/03/2014 5  5 - 15 mmol/L Final   ??? Glucose 03/03/2014 97  65 - 100 mg/dL Final   ??? BUN 03/03/2014 7  6 - 20 MG/DL Final   ??? Creatinine 03/03/2014 0.96  0.55 - 1.02 MG/DL Final   ??? BUN/Creatinine ratio 03/03/2014 7* 12 - 20   Final   ??? GFR est AA 03/03/2014 >60  >60 ml/min/1.25m Final   ??? GFR est non-AA 03/03/2014 >60  >60 ml/min/1.730mFinal   ??? Calcium 03/03/2014 8.9  8.5 - 10.1 MG/DL Final   ??? Bilirubin, total 03/03/2014 0.6  0.2 - 1.0 MG/DL Final   ??? ALT 03/03/2014 21  12 - 78 U/L Final   ??? AST 03/03/2014 13* 15 - 37 U/L Final   ??? Alk. phosphatase 03/03/2014 81  45 - 117 U/L Final   ??? Protein, total 03/03/2014 8.3* 6.4 - 8.2 g/dL Final   ??? Albumin 03/03/2014 3.8  3.5 - 5.0 g/dL Final   ??? Globulin 03/03/2014 4.5* 2.0 - 4.0 g/dL Final   ??? A-G Ratio 03/03/2014 0.8* 1.1 - 2.2   Final   ??? ALCOHOL(ETHYL),SERUM 03/03/2014 <10  <10 MG/DL Final    ??? Color 03/03/2014 YELLOW/STRAW   Final ??? Appearance 03/03/2014 CLEAR  CLEAR   Final   ??? Specific gravity 03/03/2014 1.019  1.003 - 1.030   Final   ??? pH (UA) 03/03/2014 6.5  5.0 - 8.0   Final   ??? Protein 03/03/2014 NEGATIVE   NEG mg/dL Final   ??? Glucose 03/03/2014 NEGATIVE   NEG mg/dL Final   ??? Ketone 03/03/2014 NEGATIVE  NEG mg/dL Final   ??? Bilirubin 03/03/2014 NEGATIVE   NEG   Final   ??? Blood 03/03/2014 NEGATIVE   NEG   Final   ??? Urobilinogen 03/03/2014 0.2  0.2 - 1.0 EU/dL Final   ??? Nitrites 03/03/2014 NEGATIVE   NEG   Final   ??? Leukocyte Esterase 03/03/2014 NEGATIVE   NEG   Final   ??? WBC 03/03/2014 0-4  0 - 4 /hpf Final   ??? RBC 03/03/2014 0-5  0 - 5 /hpf Final   ??? Epithelial cells 03/03/2014 MODERATE* FEW /lpf Final   ??? Bacteria 03/03/2014 1+* NEG /hpf Final   ??? UA:UC IF INDICATED 03/03/2014 URINE CULTURE ORDERED* CNI   Final   ??? Mucus 03/03/2014 2+* NEG /lpf Final   ??? AMPHETAMINE 03/03/2014 NEGATIVE   NEG   Final   ??? BARBITURATES 03/03/2014 NEGATIVE   NEG   Final   ??? BENZODIAZEPINE 03/03/2014 NEGATIVE   NEG   Final ??? COCAINE 03/03/2014 NEGATIVE   NEG   Final   ??? METHADONE 03/03/2014 NEGATIVE   NEG   Final   ??? OPIATES 03/03/2014 NEGATIVE   NEG   Final   ??? PCP(PHENCYCLIDINE) 03/03/2014 NEGATIVE   NEG   Final   ??? THC (TH-CANNABINOL) 03/03/2014 NEGATIVE   NEG   Final   ??? DRUG SCRN COMMENT 03/03/2014 (NOTE)   Final   ??? SALICYLATE 17/61/6073 <7.1* 2.8 - 20.0 MG/DL Final   ??? ACETAMINOPHEN 03/03/2014 <2* 10 - 30 ug/mL Final   ??? Pregnancy test,urine (POC) 03/03/2014 NEGATIVE   NEG   Final   ??? Special Requests: 03/03/2014 NO SPECIAL REQUESTS   Final   ??? Colony Count 03/03/2014 <1,000 COLONIES/mL   Final   ??? Culture result: 03/03/2014 NO GROWTH 1 DAY   Final   ??? TSH 03/03/2014 3.01  0.36 - 3.74 uIU/mL Final   ??? Valproic acid 03/11/2014 97  50 - 100 ug/ml Final   ??? WBC 03/11/2014 9.0  3.6 - 11.0 K/uL Final   ??? RBC 03/11/2014 3.98  3.80 - 5.20 M/uL Final   ??? HGB 03/11/2014 9.6* 11.5 - 16.0 g/dL Final    ??? HCT 03/11/2014 30.2* 35.0 - 47.0 % Final   ??? MCV 03/11/2014 75.9* 80.0 - 99.0 FL Final   ??? MCH 03/11/2014 24.1* 26.0 - 34.0 PG Final   ??? MCHC 03/11/2014 31.8  30.0 - 36.5 g/dL Final   ??? RDW 03/11/2014 17.5* 11.5 - 14.5 % Final   ??? PLATELET 03/11/2014 362  150 - 400 K/uL Final   ??? Protein, total 03/11/2014 7.2  6.4 - 8.2 g/dL Final   ??? Albumin 03/11/2014 3.0* 3.5 - 5.0 g/dL Final   ??? Globulin 03/11/2014 4.2* 2.0 - 4.0 g/dL Final   ??? A-G Ratio 03/11/2014 0.7* 1.1 - 2.2   Final   ??? Bilirubin, total 03/11/2014 0.2  0.2 - 1.0 MG/DL Final   ??? Bilirubin, direct 03/11/2014 <0.1  0.0 - 0.2 MG/DL Final   ??? Alk. phosphatase 03/11/2014 79  45 - 117 U/L Final   ??? AST 03/11/2014 6* 15 - 37 U/L Final   ??? ALT 03/11/2014 12  12 - 78 U/L Final     No results found.                DISPOSITION:    Home. Patient to f/u with o/p psychiatric, psychotherapy appointments. Patient is to f/u with internist as directed.  Patient should have a depakote level and associated labs  checked within the next 1-2 weeks by patient's o/p psychiatrist/internist.               FOLLOW-UP CARE:    Activity as tolerated  Regular Diet  Wound Care: none needed.  Follow-up Information     Follow up With Details Comments Contact Info    Psychotherapeutic Services  On 03/13/2014 you will be visited by your ACT team in your home  [facility]   [address]   [place] [address]   [phone]  [phone] fax                  PROGNOSIS:  Greatly dependent upon patient's ability to f/u with o/p psychiatric/psychotherapy appointments as well as to comply with psychiatric medications as prescribed. Patient denies suicidal or homicidal ideations. Christy Henry fully contracts for safety. Patient reports many positive predictive factors in terms of not attempting suicide or homicide. Patient appears to be at low risk of suicide or homicide. Patient and family are aware and in agreement with discharge and discharge plan.             DISCHARGE MEDICATIONS: (no changes made).    Informed consent given for the use of following psychotropic medications:  Cannot display discharge medications since this patient is not currently admitted.             A coordinated, multidisplinary treatment team round was conducted with Danae Chen Reed---this is done daily here at [facility] . This team consists of the nurse, psychiatric unit pharmcist, social worker and Probation officer.     I have spent greater than 35 minutes on discharge work.    Signed:  Isac Caddy, MD  03/12/2014

## 2014-03-12 NOTE — Behavioral Health Treatment Team (Signed)
0600  Pt slept 8 hrs.  Verbalized no complaints.  Continues q15 min checks for safety.

## 2014-03-12 NOTE — Progress Notes (Signed)
GROUP THERAPY PROGRESS NOTE    Christy Henry is participating in Process Group.     Group time: 45 minutes    Group therapy participation:        Interacts positively with others in the group. Evidenced a positive demeanor. Interacted in a positive reality based fashion.

## 2014-03-12 NOTE — Progress Notes (Signed)
Pressure Ulcer Documentation  (COMPLETE ONE LABEL PER PRESSURE ULCER)  For further information, please review corresponding Wound Care flowsheet.      Christy Henry has:    No pressure ulcer noted and pressure ulcer prevention initiated.      Location Number:      Baker PieriniERIN J VIOL, RN

## 2014-03-12 NOTE — Behavioral Health Treatment Team (Signed)
Pt picked up by her treatment team from [place]. No distress noted. All belongings returned to pt. Discharge instructions reviewed and signed with pt.

## 2014-03-12 NOTE — Behavioral Health Treatment Team (Signed)
Pt received resting in bed. No distress noted at this time. Discharge order for today received. Will continue to monitor.

## 2014-05-21 ENCOUNTER — Emergency Department (HOSPITAL_COMMUNITY)
Admission: EM | Admit: 2014-05-21 | Discharge: 2014-05-21 | Disposition: A | Discharge status: Home / Self Care | Payer: Medicaid Other | Attending: Emergency Medicine | Admitting: Emergency Medicine

## 2014-05-21 ENCOUNTER — Emergency Department (HOSPITAL_COMMUNITY): Payer: Medicaid Other

## 2014-05-21 DIAGNOSIS — Z862 Personal history of diseases of the blood and blood-forming organs and certain disorders involving the immune mechanism: Secondary | ICD-10-CM | POA: Diagnosis not present

## 2014-05-21 DIAGNOSIS — Z8719 Personal history of other diseases of the digestive system: Secondary | ICD-10-CM | POA: Insufficient documentation

## 2014-05-21 DIAGNOSIS — R531 Weakness: Secondary | ICD-10-CM

## 2014-05-21 DIAGNOSIS — R0602 Shortness of breath: Secondary | ICD-10-CM | POA: Insufficient documentation

## 2014-05-21 DIAGNOSIS — Z79899 Other long term (current) drug therapy: Secondary | ICD-10-CM | POA: Diagnosis not present

## 2014-05-21 DIAGNOSIS — Z720 Tobacco use: Secondary | ICD-10-CM | POA: Diagnosis not present

## 2014-05-21 DIAGNOSIS — Z3202 Encounter for pregnancy test, result negative: Secondary | ICD-10-CM | POA: Insufficient documentation

## 2014-05-21 DIAGNOSIS — N939 Abnormal uterine and vaginal bleeding, unspecified: Secondary | ICD-10-CM | POA: Diagnosis present

## 2014-05-21 DIAGNOSIS — I10 Essential (primary) hypertension: Secondary | ICD-10-CM | POA: Diagnosis not present

## 2014-05-21 DIAGNOSIS — R42 Dizziness and giddiness: Secondary | ICD-10-CM | POA: Insufficient documentation

## 2014-05-21 LAB — COMPREHENSIVE METABOLIC PANEL
ALBUMIN: 3.9 g/dL (ref 3.5–5.2)
ALT: 30 U/L (ref 0–35)
AST: 162 U/L — ABNORMAL HIGH (ref 0–37)
Alkaline Phosphatase: 81 U/L (ref 39–117)
Anion gap: 6 (ref 5–15)
BILIRUBIN TOTAL: 0.3 mg/dL (ref 0.3–1.2)
BUN: 11 mg/dL (ref 6–23)
CHLORIDE: 107 mmol/L (ref 96–112)
CO2: 24 mmol/L (ref 19–32)
CREATININE: 0.86 mg/dL (ref 0.50–1.10)
Calcium: 9.2 mg/dL (ref 8.4–10.5)
GFR, EST NON AFRICAN AMERICAN: 81 mL/min — AB (ref >90)
Glucose, Bld: 99 mg/dL (ref 70–99)
POTASSIUM: 4 mmol/L (ref 3.5–5.1)
SODIUM: 137 mmol/L (ref 135–145)
Total Protein: 8 g/dL (ref 6.0–8.3)

## 2014-05-21 LAB — CBC WITH DIFFERENTIAL/PLATELET
BASOS ABS: 0 10*3/uL (ref 0.0–0.1)
Basophils Relative: 0 % (ref 0–1)
EOS ABS: 0.2 10*3/uL (ref 0.0–0.7)
EOS PCT: 3 % (ref 0–5)
HCT: 29.4 % — ABNORMAL LOW (ref 36.0–46.0)
HEMOGLOBIN: 9.1 g/dL — AB (ref 12.0–15.0)
Lymphocytes Relative: 37 % (ref 12–46)
Lymphs Abs: 3.3 10*3/uL (ref 0.7–4.0)
MCH: 25 pg — ABNORMAL LOW (ref 26.0–34.0)
MCHC: 31 g/dL (ref 30.0–36.0)
MCV: 80.8 fL (ref 78.0–100.0)
MONO ABS: 0.5 10*3/uL (ref 0.1–1.0)
Monocytes Relative: 5 % (ref 3–12)
Neutro Abs: 4.9 10*3/uL (ref 1.7–7.7)
Neutrophils Relative %: 55 % (ref 43–77)
PLATELETS: 332 10*3/uL (ref 150–400)
RBC: 3.64 MIL/uL — ABNORMAL LOW (ref 3.87–5.11)
RDW: 18.5 % — ABNORMAL HIGH (ref 11.5–15.5)
WBC: 9 10*3/uL (ref 4.0–10.5)

## 2014-05-21 LAB — BRAIN NATRIURETIC PEPTIDE: B Natriuretic Peptide: 26.3 pg/mL (ref 0.0–100.0)

## 2014-05-21 LAB — I-STAT TROPONIN, ED: Troponin i, poc: 0 ng/mL (ref 0.00–0.08)

## 2014-05-21 LAB — URINALYSIS, ROUTINE W REFLEX MICROSCOPIC
Bilirubin Urine: NEGATIVE
GLUCOSE, UA: NEGATIVE mg/dL
Ketones, ur: NEGATIVE mg/dL
LEUKOCYTES UA: NEGATIVE
NITRITE: NEGATIVE
PROTEIN: NEGATIVE mg/dL
Specific Gravity, Urine: 1.023 (ref 1.005–1.030)
Urobilinogen, UA: 0.2 mg/dL (ref 0.0–1.0)
pH: 5.5 (ref 5.0–8.0)

## 2014-05-21 LAB — URINE MICROSCOPIC-ADD ON

## 2014-05-21 LAB — PREGNANCY, URINE: Preg Test, Ur: NEGATIVE

## 2014-05-21 MED ORDER — SODIUM CHLORIDE 0.9 % IV BOLUS (SEPSIS)
1000.0000 mL | Freq: Once | INTRAVENOUS | Status: AC
  Administered 2014-05-21: 1000 mL via INTRAVENOUS

## 2014-05-21 NOTE — ED Notes (Signed)
Pt c/o bright red vaginal bleeding that started at the beginning of last month. Pt states she was told to come here for further evaluation by her PCP.

## 2014-05-21 NOTE — Discharge Instructions (Signed)
Weakness °Weakness is a lack of strength. It may be felt all over the body (generalized) or in one specific part of the body (focal). Some causes of weakness can be serious. You may need further medical evaluation, especially if you are elderly or you have a history of immunosuppression (such as chemotherapy or HIV), kidney disease, heart disease, or diabetes. °CAUSES  °Weakness can be caused by many different things, including: °· Infection. °· Physical exhaustion. °· Internal bleeding or other blood loss that results in a lack of red blood cells (anemia). °· Dehydration. This cause is more common in elderly people. °· Side effects or electrolyte abnormalities from medicines, such as pain medicines or sedatives. °· Emotional distress, anxiety, or depression. °· Circulation problems, especially severe peripheral arterial disease. °· Heart disease, such as rapid atrial fibrillation, bradycardia, or heart failure. °· Nervous system disorders, such as Guillain-Barré syndrome, multiple sclerosis, or stroke. °DIAGNOSIS  °To find the cause of your weakness, your caregiver will take your history and perform a physical exam. Lab tests or X-rays may also be ordered, if needed. °TREATMENT  °Treatment of weakness depends on the cause of your symptoms and can vary greatly. °HOME CARE INSTRUCTIONS  °· Rest as needed. °· Eat a well-balanced diet. °· Try to get some exercise every day. °· Only take over-the-counter or prescription medicines as directed by your caregiver. °SEEK MEDICAL CARE IF:  °· Your weakness seems to be getting worse or spreads to other parts of your body. °· You develop new aches or pains. °SEEK IMMEDIATE MEDICAL CARE IF:  °· You cannot perform your normal daily activities, such as getting dressed and feeding yourself. °· You cannot walk up and down stairs, or you feel exhausted when you do so. °· You have shortness of breath or chest pain. °· You have difficulty moving parts of your body. °· You have weakness  in only one area of the body or on only one side of the body. °· You have a fever. °· You have trouble speaking or swallowing. °· You cannot control your bladder or bowel movements. °· You have black or bloody vomit or stools. °MAKE SURE YOU: °· Understand these instructions. °· Will watch your condition. °· Will get help right away if you are not doing well or get worse. °Document Released: 04/05/2005 Document Revised: 10/05/2011 Document Reviewed: 06/04/2011 °ExitCare® Patient Information ©2015 [employer]. This information is not intended to replace advice given to you by your health care provider. Make sure you discuss any questions you have with your health care provider. ° ° °Emergency Department Resource Guide °1) Find a Doctor and Pay Out of Pocket °Although you won't have to find out who is covered by your insurance plan, it is a good idea to ask around and get recommendations. You will then need to call the office and see if the doctor you have chosen will accept you as a new patient and what types of options they offer for patients who are self-pay. Some doctors offer discounts or will set up payment plans for their patients who do not have insurance, but you will need to ask so you aren't surprised when you get to your appointment. ° °2) Contact Your Local Health Department °Not all health departments have doctors that can see patients for sick visits, but many do, so it is worth a call to see if yours does. If you don't know where your local health department is, you can check in your phone book. The   CDC also has a tool to help you locate your state's health department, and many state websites also have listings of all of their local health departments. ° °3) Find a Walk-in Clinic °If your illness is not likely to be very severe or complicated, you may want to try a walk in clinic. These are popping up all over the country in pharmacies, drugstores, and shopping centers. They're usually staffed by  nurse practitioners or physician assistants that have been trained to treat common illnesses and complaints. They're usually fairly quick and inexpensive. However, if you have serious medical issues or chronic medical problems, these are probably not your best option. ° °No Primary Care Doctor: °- Call [facility] at  832-8000 - they can help you locate a primary care doctor that  accepts your insurance, provides certain services, etc. °- Physician Referral Service- 1-800-533-3463 ° °Chronic Pain Problems: °Organization         Address  Phone   Notes  °[facility]  (336) 297-2271 Patients need to be referred by their primary care doctor.  ° °Medication Assistance: °Organization         Address  Phone   Notes  °[place] [facility] [address] °[place], [place] [address] (336) 641-8030 --Must be a resident of [place] °-- Must have NO insurance coverage whatsoever (no Medicaid/ Medicare, etc.) °-- The pt. MUST have a primary care doctor that directs their care regularly and follows them in the community °  °[facility]  (866) 331-1348   °[employer]  (888) 892-1162   ° °Agencies that provide inexpensive medical care: °Organization         Address  Phone   Notes  °[facility]  (336) 832-8035   °[facility]    (336) 832-7272   °[facility] [address] °[place], [place] [address] (336) 832-4777   °[facility] [place] [address], °[place] (336) 271-4999   °[facility]    (336) 373-0678   °[facility]    (336) 272-1050   °[facility] ° [address], [place] Phone:  (336) 832-4444, Fax:  (336) 832-4440 Hours of Operation:  9 am - 6 pm, M-F.  Also accepts Medicaid/Medicare and self-pay.  °[facility] ° [address], [place] Phone: (336) 832-3150, Fax: (336) 832-3151. Hours of Operation:  8:30  am - 5:30 pm, M-F.  Also accepts Medicaid and self-pay.  °[facility] [place] [address], [place] Phone: (336) 878-6027   °[facility] [address], [place], [place] (336)723-1848, Ext. 123 Mondays & Thursdays: 7-9 AM.  First 15 patients are seen on a first come, first serve basis. °  ° °Medicaid-accepting [place] Providers: ° °Organization         Address  Phone   Notes  °[facility] [address], [place] (336) 641-2100 Also accepts self-pay patients.  °[facility] [address], [place] ° (336) 856-9996   °[facility] [address], [place] (336) 288-8857   °[facility] [address] [place] [address], [place] (336) 299-7000   °Veita Bland [address],   ° (336) 373-1557 Only accepts [place] Access Medicaid patients after they have their name applied to their card.  ° °Self-Pay (no   insurance) in [place]: ° °Organization         Address  Phone   Notes  °Sickle Cell Patients, [facility] [address], [place] (336) 832-1970   °[facility] [address], [place] (336) 832-4400   °[facility] ° [address], [place] (336) 992-4800   °[facility]/Dr. Osei-Bonsu ° [address] [place] [address], [place] or [address], [place] (336) 841-8500 Phone number for both [place] and [place] locations is the same.  °[facility] [address], [place] (336) 299-0000   °[facility] [address] [place] [address], [place] or [address] (336) 852-7530 °(336) 878-2260   °[facility] [address], [place] (336) 350-1642, phone; (336) 294-5005, fax Sees patients 1st and 3rd Saturday of every month.  Must not qualify for public or private insurance (i.e. Medicaid, Medicare, [place] [facility]  Choice, Veterans' Benefits) • Household income should be no more than 200% of the poverty level •The clinic cannot treat you if you are pregnant or think you are pregnant • Sexually transmitted diseases are not treated at the clinic.  ° ° °Dental Care: °Organization         Address  Phone  Notes  °[facility] [address], [place] (336) 641-6152 Accepts children up to age 21 who are enrolled in Medicaid or [place] Health Choice; pregnant women with a Medicaid card; and children who have applied for Medicaid or [place] Health Choice, but were declined, whose parents can pay a reduced fee at time of service.  °[facility] [place]  [address], [place] (336) 641-7733 Accepts children up to age 21 who are enrolled in Medicaid or [place] Health Choice; pregnant women with a Medicaid card; and children who have applied for Medicaid or [place] Health Choice, but were declined, whose parents can pay a reduced fee at time of service.  °[facility] ° [address], [place] (336) 641-4533 Patients are seen by appointment only. Walk-ins are not accepted. [facility] will see patients 18 years of age and older. °Monday - Tuesday (8am-5pm) °Most Wednesdays (8:30-5pm) °$30 per visit, cash only  °[facility] ° [address], [place] (336) 641-4533 Patients are seen by appointment only. Walk-ins are not accepted. [facility] will see patients 18 years of age and older. °One Wednesday Evening (Monthly: Volunteer Based).  $30 per visit, cash only  °[facility]  (919) 537-3737 for adults; Children under age 4, call Graduate Pediatric Dentistry at (919) [phone]. Children aged 4-14, please call (919) [phone] to request a pediatric application. ° Dental services are provided in all areas of dental care including fillings, crowns and bridges, complete  and partial dentures, implants, gum treatment, root canals, and extractions. Preventive care is also provided. Treatment is provided to both adults and children. °Patients are selected via a lottery and there is often a waiting list. °  °[facility] [address], °[place] ° (336) 763-8833 [email or web address] °  °[facility] [address], [place], [place] (336)723-1848, Ext. 123 Second and Fourth Thursday of each month, opens at 6:30 AM; Clinic ends at 9 AM.  Patients are seen on a first-come first-served basis, and a limited number are seen   during each clinic.  ° °[facility] ° [address], [place], [place] (336) 723-7904   Eligibility Requirements °You must have lived in [place], [place], or [place] counties for at least the last three months. °  You cannot be eligible for state or federal sponsored healthcare insurance, including Veterans Administration, Medicaid, or Medicare. °  You generally cannot be eligible for healthcare insurance through your employer.  °  How to apply: °Eligibility screenings are held every Tuesday and Wednesday afternoon from 1:00 pm until 4:00 pm. You do not need an appointment for the interview!  °[facility] [address], [place], [place] 336-631-2330   °[facility]  336-342-8273   °[facility]  336-703-3100   °[facility]  336-570-6415   ° °Behavioral Health Resources in the Community: °Intensive Outpatient Programs °Organization         Address  Phone  Notes  °[facility] [address], [place], [place] 336-878-6098   °[facility] [address], [place], [place] 336-832-9800   °[facility] [address], [place], [place] ° 336-882-2125   °[facility] [address],  °[place], [place] 1-800-853-5163 or 336-641-4981   °Substance Abuse Resources °Organization          Address  Phone  Notes  °[facility]  336-882-2125   °[facility]  336-784-9470   °[facility]  336-285-9073   °[facility]  336-845-3988   °Residential & Outpatient Substance Abuse Program  1-800-659-3381   °Psychological Services °Organization         Address  Phone  Notes  °[facility]  336- 832-9600   °[facility]  336- 378-7881   °[facility] [address], [place] 1-800-853-5163 or 336-641-4981   ° °Mobile Crisis Teams °Organization         Address  Phone  Notes  °[facility], Mobile Crisis Care Unit  1-877-626-1772   °Assertive °[facility] ° [address] [place], [place] 336-834-9664   °Sharon DeEsch [address] °[place] 336-554-5454   ° °Self-Help/Support Groups °Organization         Address  Phone             Notes  °[facility] - variety of support groups  336- 373-1402 Call for more information  °Narcotics Anonymous (NA), [facility] [address], °[place]  2 meetings at this location  ° °Residential Treatment Programs °Organization         Address  Phone  Notes  °[facility] [address],    °[place]  1-866-801-8205   °[facility] ° [address], [place], [place] 704-293-8524   °[facility] [address], [place] 336-845-3988 Admissions: 8am-3pm M-F  °[facility] [address]    °[place], [place] 336-841-1104   °[facility] [address], [place], [place] 336-379-7146   °[facility] [address]  °[place], [place] 336-285-9073   °[facility] Intensive Outpatient [address], [place], [place] 336-852-3033   °[facility] [address]  °[place], [place] 1-877-615-2722 or 336-784-9470   °[facility] [address] [place], [place]  336-227-7417 Accepts Medicaid  °[facility] [address]  °  [place] 1-800-659-3381 Substance Abuse/Addiction Treatment  ° °[place] Behavioral Health Resources °Organization         Address  Phone  Notes  °[facility]  (888) 581-9988   °Julie Brannon, PhD [address] [place], [place]   (336) 349-5553 or (336) 951-0000   °[facility]   [address] °[place], [place] (336) 349-4454   °[facility] [address], [place], [place] (336) 342-8316 Insurance/Medicaid/sponsorship through [facility]  °[facility] [address] [place]                                    [place], [place] (336) 342-8316 Therapy/tele-psych/case  °[facility] [address]  ° [place], [place] (336) 349-2233    °Dr. Arfeen  (336) 349-4544   °[facility] [place]  [employer] [place] [facility] 1) [address], [place] °2) [address], [place] °3)  [address], [place] (336) 349-3220 °(336) 342-7768 ° °(336) 342-8140   °[place] [facility] (336) 342-1394 or (336) 342-3537 (After Hours)    ° ° ° °

## 2014-05-21 NOTE — ED Provider Notes (Signed)
CSN: [id number]     Arrival date & time 05/21/14  1434 History   First MD Initiated Contact with Patient 05/21/14 1507     Chief Complaint  Patient presents with  . Vaginal Bleeding     (Consider location/radiation/quality/duration/timing/severity/associated sxs/prior Treatment) HPI [name] is a 44 year old female with past medical history of depression, bipolar 1 disorder, hypertension, anemia with history of transfusion who presents the ER complaining of generalized weakness, lightheadedness, intermittent visual disturbance 1 month. Patient states she has been anemic in the past, was recently seen by her contact team and noted to have a hemoglobin of 9.6. Patient states she has been experiencing lightheadedness and weakness over the past 2 weeks which has been consistent, and is exacerbated with movement and standing. Patient also reports some mild shortness of breath associated with standing up and when she is feeling her weakness and lightheadedness. Patient states that she has been having heavy vaginal bleeding, which has been ongoing for "a long time". Patient states she is currently having vaginal bleeding, however this is consistent with bleeding she has been having the past with her menses. She states to me she is not concerned about the vaginal bleeding itself, however believes her heavy bleeding may be contributing to her ongoing anemia. Patient states she is not currently sexually active. Patient denies having any headache, neck pain, chest pain, nausea, vomiting, diarrhea, dysuria.  Past Medical History  Diagnosis Date  . Depression   . Bipolar 1 disorder   . Hypertension   . Hemorrhoid   . Transfusion history   . Schizophrenia    Past Surgical History  Procedure Laterality Date  . Cesarean section     Family History  Problem Relation Age of Onset  . Diabetes type II Other    History  Substance Use Topics  . Smoking status: Current Every Day Smoker -- 1.00 packs/day  for 17 years    Types: Cigarettes  . Smokeless tobacco: Never Used  . Alcohol Use: No   OB History    No data available     Review of Systems  Constitutional: Negative for fever.  HENT: Negative for trouble swallowing.   Eyes: Negative for visual disturbance.  Respiratory: Negative for shortness of breath.   Cardiovascular: Negative for chest pain.  Gastrointestinal: Negative for nausea, vomiting and abdominal pain.  Genitourinary: Negative for dysuria.  Musculoskeletal: Negative for neck pain.  Skin: Negative for rash.  Neurological: Positive for weakness and light-headedness. Negative for dizziness and numbness.  Psychiatric/Behavioral: Negative.       Allergies  Flagyl and Sulfa antibiotics  Home Medications   Prior to Admission medications   Medication Sig Start Date End Date Taking? Authorizing Provider  albuterol (PROVENTIL HFA;VENTOLIN HFA) 108 (90 BASE) MCG/ACT inhaler Inhale 2 puffs into the lungs every 6 (six) hours as needed for wheezing or shortness of breath.   Yes Historical Provider, MD  ibuprofen (ADVIL,MOTRIN) 200 MG tablet Take 200 mg by mouth every 6 (six) hours as needed for headache or moderate pain.   Yes Historical Provider, MD  QUEtiapine (SEROQUEL) 400 MG tablet Take 400 mg by mouth at bedtime.   Yes Historical Provider, MD  QUEtiapine (SEROQUEL) 50 MG tablet Take 1 tablet (50 mg total) by mouth 3 (three) times daily. Patient not taking: Reported on 05/21/2014 02/17/14   Shuvon Rankin, NP   BP 107/55 mmHg  Pulse 75  Temp(Src) 97.5 F (36.4 C) (Oral)  Resp 18  SpO2 100%  LMP 05/21/2014 Physical  Exam  Constitutional: She is oriented to person, place, and time. She appears well-developed and well-nourished. No distress.  HENT:  Head: Normocephalic and atraumatic.  Mouth/Throat: Oropharynx is clear and moist. No oropharyngeal exudate.  Eyes: Right eye exhibits no discharge. Left eye exhibits no discharge. No scleral icterus.  Neck: Normal range of  motion.  Cardiovascular: Normal rate, regular rhythm and normal heart sounds.   No murmur heard. Pulmonary/Chest: Effort normal and breath sounds normal. No respiratory distress.  Abdominal: Soft. There is no tenderness.  Musculoskeletal: Normal range of motion. She exhibits no edema or tenderness.  Neurological: She is alert and oriented to person, place, and time. No cranial nerve deficit. Coordination normal.  Patient fully alert, answering questions appropriately in full, clear sentences. Cranial nerves II through XII grossly intact. Motor strength 5 out of 5 in all major muscle groups of upper and lower extremities. Distal sensation intact.  Skin: Skin is warm and dry. No rash noted. She is not diaphoretic.  Psychiatric: She has a normal mood and affect.  Nursing note and vitals reviewed.   ED Course  Procedures (including critical care time) Labs Review Labs Reviewed  URINALYSIS, ROUTINE W REFLEX MICROSCOPIC - Abnormal; Notable for the following:    Hgb urine dipstick TRACE (*)    All other components within normal limits  CBC WITH DIFFERENTIAL/PLATELET - Abnormal; Notable for the following:    RBC 3.64 (*)    Hemoglobin 9.1 (*)    HCT 29.4 (*)    MCH 25.0 (*)    RDW 18.5 (*)    All other components within normal limits  COMPREHENSIVE METABOLIC PANEL - Abnormal; Notable for the following:    AST 162 (*)    GFR calc non Af Amer 81 (*)    All other components within normal limits  URINE MICROSCOPIC-ADD ON - Abnormal; Notable for the following:    Squamous Epithelial / LPF FEW (*)    All other components within normal limits  PREGNANCY, URINE  BRAIN NATRIURETIC PEPTIDE  I-STAT TROPOININ, ED    Imaging Review Dg Chest 2 View  05/21/2014   CLINICAL DATA:  Shortness of breath, weakness for 2 weeks.  Smoker.  EXAM: CHEST  2 VIEW  COMPARISON:  07/29/2013  FINDINGS: There is mild lingular scarring. There is no focal parenchymal opacity, pleural effusion, or pneumothorax. The  heart and mediastinal contours are unremarkable.  The osseous structures are unremarkable.  IMPRESSION: No active cardiopulmonary disease.   Electronically Signed   By: Kathreen Devoid   On: 05/21/2014 15:51     EKG Interpretation   Date/Time:  Tuesday May 21 2014 15:34:22 EST Ventricular Rate:  82 PR Interval:  148 QRS Duration: 83 QT Interval:  363 QTC Calculation: 424 R Axis:   64 Text Interpretation:  Normal sinus rhythm Low voltage, precordial leads no  ST/T changes no significant change since Oct 2015 Confirmed by Regenia Skeeter   MD, SCOTT ([id number]) on 05/21/2014 3:57:30 PM      MDM   Final diagnoses:  Shortness of breath  Generalized weakness  Light headedness   Patient here complaining of generalized weakness, some mild shortness of breath and anemia. Patient states that she has had heavy vaginal bleeding, however this consistent with bleeding she typically experiences during her menses. Patient states she does not believe this needs evaluation today, however she is concerned that her vaginal bleeding has exacerbated her anemia cause her symptoms. Patient's exam overall is benign. Neuro exam without deficit on my  abdominal exam benign. No concern for acute neurologic, abdominal, cardiac, pulmonary, GYN pathology. Patient's labs reassuring. Patient nontoxic tachycardic, nontachypneic, non-hypoxic, well-appearing and in no acute distress during stay in ER. Patient shows evidence of mild anemia which appears to be baseline for her. Patient's symptoms of generalized weakness with fatigue and mild shortness of breath are thought to be most likely associated with her mild, ongoing anemia. EKG without evidence of injury or ectopy. BNP within normal limits. No leukocytosis, or electrolyte abnormality. Troponin negative, patient's symptoms have been present greater than 6 hours. PERC negative. Chest radiograph unremarkable for acute pathology. With patient being hemodynamically stable, we will  discharge patient and strongly encourage her to follow-up with her primary care provider. We also provided her with a follow-up with women's clinic since patient does not currently have an OB/GYN for her heavy vaginal bleeding during menses. Patient provided with resource guide to help her find a primary care provider. Return precautions discussed, patient encouraged to call or return to the ER should she have any questions or concerns. Patient verbalizes understanding and agreement of this plan.  BP 107/55 mmHg  Pulse 75  Temp(Src) 97.5 F (36.4 C) (Oral)  Resp 18  SpO2 100%  LMP 05/21/2014  Signed,  Dahlia Bailiff, PA-C 6:29 PM  Patient seen and discussed with Dr. Sherwood Gambler, MD   Carrie Mew, PA-C 05/21/14 1829  Ephraim Hamburger, MD 05/25/14 [phone]

## 2014-06-16 ENCOUNTER — Encounter (HOSPITAL_COMMUNITY): Payer: Self-pay | Admitting: Emergency Medicine

## 2014-06-16 ENCOUNTER — Emergency Department (HOSPITAL_COMMUNITY)
Admission: EM | Admit: 2014-06-16 | Discharge: 2014-06-17 | Disposition: A | Discharge status: Home / Self Care | Payer: Medicaid Other | Attending: Emergency Medicine | Admitting: Emergency Medicine

## 2014-06-16 DIAGNOSIS — Z3202 Encounter for pregnancy test, result negative: Secondary | ICD-10-CM | POA: Diagnosis not present

## 2014-06-16 DIAGNOSIS — Z8719 Personal history of other diseases of the digestive system: Secondary | ICD-10-CM | POA: Insufficient documentation

## 2014-06-16 DIAGNOSIS — F209 Schizophrenia, unspecified: Secondary | ICD-10-CM | POA: Diagnosis not present

## 2014-06-16 DIAGNOSIS — Z720 Tobacco use: Secondary | ICD-10-CM | POA: Insufficient documentation

## 2014-06-16 DIAGNOSIS — R55 Syncope and collapse: Secondary | ICD-10-CM | POA: Diagnosis not present

## 2014-06-16 DIAGNOSIS — E669 Obesity, unspecified: Secondary | ICD-10-CM | POA: Diagnosis not present

## 2014-06-16 DIAGNOSIS — R5383 Other fatigue: Secondary | ICD-10-CM | POA: Insufficient documentation

## 2014-06-16 DIAGNOSIS — R531 Weakness: Secondary | ICD-10-CM | POA: Diagnosis present

## 2014-06-16 DIAGNOSIS — Z79899 Other long term (current) drug therapy: Secondary | ICD-10-CM | POA: Diagnosis not present

## 2014-06-16 DIAGNOSIS — F319 Bipolar disorder, unspecified: Secondary | ICD-10-CM | POA: Diagnosis not present

## 2014-06-16 NOTE — ED Notes (Signed)
Pt transported from home by EMS for near syncopal episode pt recently started Trazodone, Seroquel for Bipolar and recently started diet, only eating one meal a day. Pt states she has been feeling bad since 2/2, fever chills, weakness, ha, dark urine. A & O. CBG 111. ST on monitor

## 2014-06-16 NOTE — ED Notes (Signed)
Per patient only recently started Seroquel. Patient reports last eating approximately 1400. C/o weakness, dizziness, confusion, "heart racing", nausea. Denies pain at this time.

## 2014-06-16 NOTE — ED Notes (Signed)
Pt has been notified that a urine sample is needed from her, says that she doesn't have to go right now

## 2014-06-16 NOTE — ED Notes (Signed)
Bed: WA01 Expected date:  Expected time:  Means of arrival:  Comments: EMS 44yo F, near syncope, gen weakness

## 2014-06-17 LAB — CBC WITH DIFFERENTIAL/PLATELET
BASOS ABS: 0 10*3/uL (ref 0.0–0.1)
Basophils Relative: 0 % (ref 0–1)
Eosinophils Absolute: 0.2 10*3/uL (ref 0.0–0.7)
Eosinophils Relative: 2 % (ref 0–5)
HEMATOCRIT: 32.5 % — AB (ref 36.0–46.0)
HEMOGLOBIN: 10.2 g/dL — AB (ref 12.0–15.0)
LYMPHS PCT: 36 % (ref 12–46)
Lymphs Abs: 4.2 10*3/uL — ABNORMAL HIGH (ref 0.7–4.0)
MCH: 25.1 pg — ABNORMAL LOW (ref 26.0–34.0)
MCHC: 31.4 g/dL (ref 30.0–36.0)
MCV: 79.9 fL (ref 78.0–100.0)
MONO ABS: 0.5 10*3/uL (ref 0.1–1.0)
MONOS PCT: 4 % (ref 3–12)
NEUTROS ABS: 6.7 10*3/uL (ref 1.7–7.7)
Neutrophils Relative %: 58 % (ref 43–77)
Platelets: 334 10*3/uL (ref 150–400)
RBC: 4.07 MIL/uL (ref 3.87–5.11)
RDW: 17.2 % — ABNORMAL HIGH (ref 11.5–15.5)
WBC: 11.7 10*3/uL — AB (ref 4.0–10.5)

## 2014-06-17 LAB — URINALYSIS, ROUTINE W REFLEX MICROSCOPIC
Bilirubin Urine: NEGATIVE
Glucose, UA: NEGATIVE mg/dL
HGB URINE DIPSTICK: NEGATIVE
Ketones, ur: NEGATIVE mg/dL
LEUKOCYTES UA: NEGATIVE
Nitrite: NEGATIVE
PROTEIN: NEGATIVE mg/dL
SPECIFIC GRAVITY, URINE: 1.01 (ref 1.005–1.030)
Urobilinogen, UA: 0.2 mg/dL (ref 0.0–1.0)
pH: 5.5 (ref 5.0–8.0)

## 2014-06-17 LAB — COMPREHENSIVE METABOLIC PANEL
ALT: 11 U/L (ref 0–35)
AST: 18 U/L (ref 0–37)
Albumin: 4 g/dL (ref 3.5–5.2)
Alkaline Phosphatase: 80 U/L (ref 39–117)
Anion gap: 7 (ref 5–15)
BUN: 10 mg/dL (ref 6–23)
CO2: 22 mmol/L (ref 19–32)
Calcium: 9.6 mg/dL (ref 8.4–10.5)
Chloride: 104 mmol/L (ref 96–112)
Creatinine, Ser: 0.76 mg/dL (ref 0.50–1.10)
GFR calc Af Amer: >90 mL/min (ref >90)
GFR calc non Af Amer: >90 mL/min (ref >90)
GLUCOSE: 97 mg/dL (ref 70–99)
POTASSIUM: 4.1 mmol/L (ref 3.5–5.1)
SODIUM: 133 mmol/L — AB (ref 135–145)
TOTAL PROTEIN: 8.2 g/dL (ref 6.0–8.3)
Total Bilirubin: 0.2 mg/dL — ABNORMAL LOW (ref 0.3–1.2)

## 2014-06-17 LAB — TSH: TSH: 2.992 u[IU]/mL (ref 0.350–4.500)

## 2014-06-17 LAB — LIPASE, BLOOD: LIPASE: 18 U/L (ref 11–59)

## 2014-06-17 LAB — PREGNANCY, URINE: PREG TEST UR: NEGATIVE

## 2014-06-17 NOTE — Discharge Instructions (Signed)
It is unclear at this time what is causing your symptoms.  You have been encouraged to drink more water.  You have also been encouraged to have small frequent healthy meals throughout the day instead of one meal.  It is not recommended you diet at this time as she were having ongoing symptoms of fatigue and weakness.  Talk with your act team to see if your medications may be causing the side effects.  Your thyroid was checked today, your TSH level is not available at this time.  It is important for you to follow-up with a local primary care doctor for further workup of your symptoms.  You can follow-up with the [facility] and wellness center or use one of the numbers listed below for help in finding a primary care doctor.   Fatigue Fatigue is a feeling of tiredness, lack of energy, lack of motivation, or feeling tired all the time. Having enough rest, good nutrition, and reducing stress will normally reduce fatigue. Consult your caregiver if it persists. The nature of your fatigue will help your caregiver to find out its cause. The treatment is based on the cause.  CAUSES  There are many causes for fatigue. Most of the time, fatigue can be traced to one or more of your habits or routines. Most causes fit into one or more of three general areas. They are: Lifestyle problems  Sleep disturbances.  Overwork.  Physical exertion.  Unhealthy habits.  Poor eating habits or eating disorders.  Alcohol and/or drug use .  Lack of proper nutrition (malnutrition). Psychological problems  Stress and/or anxiety problems.  Depression.  Grief.  Boredom. Medical Problems or Conditions  Anemia.  Pregnancy.  Thyroid gland problems.  Recovery from major surgery.  Continuous pain.  Emphysema or asthma that is not well controlled  Allergic conditions.  Diabetes.  Infections (such as mononucleosis).  Obesity.  Sleep disorders, such as sleep apnea.  Heart failure or other heart-related  problems.  Cancer.  Kidney disease.  Liver disease.  Effects of certain medicines such as antihistamines, cough and cold remedies, prescription pain medicines, heart and blood pressure medicines, drugs used for treatment of cancer, and some antidepressants. SYMPTOMS  The symptoms of fatigue include:   Lack of energy.  Lack of drive (motivation).  Drowsiness.  Feeling of indifference to the surroundings. DIAGNOSIS  The details of how you feel help guide your caregiver in finding out what is causing the fatigue. You will be asked about your present and past health condition. It is important to review all medicines that you take, including prescription and non-prescription items. A thorough exam will be done. You will be questioned about your feelings, habits, and normal lifestyle. Your caregiver may suggest blood tests, urine tests, or other tests to look for common medical causes of fatigue.  TREATMENT  Fatigue is treated by correcting the underlying cause. For example, if you have continuous pain or depression, treating these causes will improve how you feel. Similarly, adjusting the dose of certain medicines will help in reducing fatigue.  HOME CARE INSTRUCTIONS   Try to get the required amount of good sleep every night.  Eat a healthy and nutritious diet, and drink enough water throughout the day.  Practice ways of relaxing (including yoga or meditation).  Exercise regularly.  Make plans to change situations that cause stress. Act on those plans so that stresses decrease over time. Keep your work and personal routine reasonable.  Avoid street drugs and minimize use of  alcohol.  Start taking a daily multivitamin after consulting your caregiver. SEEK MEDICAL CARE IF:   You have persistent tiredness, which cannot be accounted for.  You have fever.  You have unintentional weight loss.  You have headaches.  You have disturbed sleep throughout the night.  You are feeling  sad.  You have constipation.  You have dry skin.  You have gained weight.  You are taking any new or different medicines that you suspect are causing fatigue.  You are unable to sleep at night.  You develop any unusual swelling of your legs or other parts of your body. SEEK IMMEDIATE MEDICAL CARE IF:   You are feeling confused.  Your vision is blurred.  You feel faint or pass out.  You develop severe headache.  You develop severe abdominal, pelvic, or back pain.  You develop chest pain, shortness of breath, or an irregular or fast heartbeat.  You are unable to pass a normal amount of urine.  You develop abnormal bleeding such as bleeding from the rectum or you vomit blood.  You have thoughts about harming yourself or committing suicide.  You are worried that you might harm someone else. MAKE SURE YOU:   Understand these instructions.  Will watch your condition.  Will get help right away if you are not doing well or get worse. Document Released: 01/31/2007 Document Revised: 06/28/2011 Document Reviewed: 08/07/2013 [facility] Patient Information 2015 [place], [place]. This information is not intended to replace advice given to you by your health care provider. Make sure you discuss any questions you have with your health care provider.  Weakness Weakness is a lack of strength. It may be felt all over the body (generalized) or in one specific part of the body (focal). Some causes of weakness can be serious. You may need further medical evaluation, especially if you are elderly or you have a history of immunosuppression (such as chemotherapy or HIV), kidney disease, heart disease, or diabetes. CAUSES  Weakness can be caused by many different things, including:  Infection.  Physical exhaustion.  Internal bleeding or other blood loss that results in a lack of red blood cells (anemia).  Dehydration. This cause is more common in elderly people.  Side effects or  electrolyte abnormalities from medicines, such as pain medicines or sedatives.  Emotional distress, anxiety, or depression.  Circulation problems, especially severe peripheral arterial disease.  Heart disease, such as rapid atrial fibrillation, bradycardia, or heart failure.  Nervous system disorders, such as Guillain-Barr syndrome, multiple sclerosis, or stroke. DIAGNOSIS  To find the cause of your weakness, your caregiver will take your history and perform a physical exam. Lab tests or X-rays may also be ordered, if needed. TREATMENT  Treatment of weakness depends on the cause of your symptoms and can vary greatly. HOME CARE INSTRUCTIONS   Rest as needed.  Eat a well-balanced diet.  Try to get some exercise every day.  Only take over-the-counter or prescription medicines as directed by your caregiver. SEEK MEDICAL CARE IF:   Your weakness seems to be getting worse or spreads to other parts of your body.  You develop new aches or pains. SEEK IMMEDIATE MEDICAL CARE IF:   You cannot perform your normal daily activities, such as getting dressed and feeding yourself.  You cannot walk up and down stairs, or you feel exhausted when you do so.  You have shortness of breath or chest pain.  You have difficulty moving parts of your body.  You have weakness in  only one area of the body or on only one side of the body.  You have a fever.  You have trouble speaking or swallowing.  You cannot control your bladder or bowel movements.  You have black or bloody vomit or stools. MAKE SURE YOU:  Understand these instructions.  Will watch your condition.  Will get help right away if you are not doing well or get worse. Document Released: 04/05/2005 Document Revised: 10/05/2011 Document Reviewed: 06/04/2011 [facility] Patient Information 2015 [place], [place]. This information is not intended to replace advice given to you by your health care provider. Make sure you discuss any  questions you have with your health care provider.

## 2014-06-17 NOTE — ED Provider Notes (Signed)
CSN: [id number]     Arrival date & time 06/16/14  2229 History   First MD Initiated Contact with Patient 06/16/14 2303     Chief Complaint  Patient presents with  . Near Syncope     (Consider location/radiation/quality/duration/timing/severity/associated sxs/prior Treatment) HPI 44 year old female presents to the emergency department from home via EMS with complaint of generalized weakness, fatigue, feeling cold all the time, occasional increased heart rate after taking her Seroquel.  Patient reports seen for same earlier this month, no improvement in her symptoms.  She at that time was evaluated for anemia thought to be due to heavy menstrual cycles.  She has not yet seen gynecology, but has an appointment in April.  Patient denies any fever, no dysuria, no new medications.  Patient reports she's currently on a diet and is only eating 1 meal a day.  Patient reports she's only eaten 1 meal a day for some time, but has been decreasing the amount that she eats.  She does not have a primary care doctor.   Past Medical History  Diagnosis Date  . Depression   . Bipolar 1 disorder   . Hemorrhoid   . Transfusion history   . Schizophrenia    Past Surgical History  Procedure Laterality Date  . Cesarean section     Family History  Problem Relation Age of Onset  . Diabetes type II Other    History  Substance Use Topics  . Smoking status: Current Every Day Smoker -- 0.50 packs/day for 17 years    Types: Cigarettes  . Smokeless tobacco: Never Used  . Alcohol Use: No   OB History    No data available     Review of Systems   See History of Present Illness; otherwise all other systems are reviewed and negative  Allergies  Flagyl and Sulfa antibiotics  Home Medications   Prior to Admission medications   Medication Sig Start Date End Date Taking? Authorizing Provider  albuterol (PROVENTIL HFA;VENTOLIN HFA) 108 (90 BASE) MCG/ACT inhaler Inhale 2 puffs into the lungs every 6 (six) hours  as needed for wheezing or shortness of breath.   Yes Historical Provider, MD  ferrous sulfate 325 (65 FE) MG EC tablet Take 325 mg by mouth daily with breakfast.   Yes Historical Provider, MD  Multiple Vitamin (MULTIVITAMIN WITH MINERALS) TABS tablet Take 1 tablet by mouth daily.   Yes Historical Provider, MD  QUEtiapine (SEROQUEL) 400 MG tablet Take 400 mg by mouth at bedtime.   Yes Historical Provider, MD  QUEtiapine (SEROQUEL) 50 MG tablet Take 1 tablet (50 mg total) by mouth 3 (three) times daily. Patient not taking: Reported on 05/21/2014 02/17/14   Shuvon Rankin, NP   BP 145/71 mmHg  Pulse 94  Temp(Src) 98.7 F (37.1 C) (Oral)  Resp 14  SpO2 100%  LMP 05/21/2014 Physical Exam  Constitutional: She is oriented to person, place, and time. She appears well-developed and well-nourished.  Obese female, no acute distress  HENT:  Head: Normocephalic and atraumatic.  Right Ear: External ear normal.  Left Ear: External ear normal.  Nose: Nose normal.  Mouth/Throat: Oropharynx is clear and moist.  Eyes: Conjunctivae and EOM are normal. Pupils are equal, round, and reactive to light.  Neck: Normal range of motion. Neck supple. No JVD present. No tracheal deviation present. No thyromegaly present.  Cardiovascular: Normal rate, regular rhythm, normal heart sounds and intact distal pulses.  Exam reveals no gallop and no friction rub.   No murmur  heard. Pulmonary/Chest: Effort normal and breath sounds normal. No stridor. No respiratory distress. She has no wheezes. She has no rales. She exhibits no tenderness.  Abdominal: Soft. Bowel sounds are normal. She exhibits no distension and no mass. There is no tenderness. There is no rebound and no guarding.  Musculoskeletal: Normal range of motion. She exhibits no edema or tenderness.  Lymphadenopathy:    She has no cervical adenopathy.  Neurological: She is alert and oriented to person, place, and time. She displays normal reflexes. No cranial nerve  deficit. She exhibits normal muscle tone. Coordination normal.  Skin: Skin is warm and dry. No rash noted. No erythema. No pallor.  Psychiatric: She has a normal mood and affect. Her behavior is normal. Judgment and thought content normal.  Nursing note and vitals reviewed.   ED Course  Procedures (including critical care time) Labs Review Labs Reviewed  COMPREHENSIVE METABOLIC PANEL - Abnormal; Notable for the following:    Sodium 133 (*)    Total Bilirubin 0.2 (*)    All other components within normal limits  CBC WITH DIFFERENTIAL/PLATELET - Abnormal; Notable for the following:    WBC 11.7 (*)    Hemoglobin 10.2 (*)    HCT 32.5 (*)    MCH 25.1 (*)    RDW 17.2 (*)    Lymphs Abs 4.2 (*)    All other components within normal limits  LIPASE, BLOOD  URINALYSIS, ROUTINE W REFLEX MICROSCOPIC  PREGNANCY, URINE  TSH    Imaging Review No results found.   EKG Interpretation   Date/Time:  Sunday June 16 2014 23:15:40 EST Ventricular Rate:  97 PR Interval:  137 QRS Duration: 80 QT Interval:  330 QTC Calculation: 419 R Axis:   69 Text Interpretation:  Sinus rhythm Low voltage, precordial leads No  significant change since last tracing Confirmed by Huriel Matt  MD, Adrin Julian ([id number])  on 06/17/2014 1:06:21 AM      MDM   Final diagnoses:  Weakness generalized    44 year old female with generalized weakness.  Nurse got report of near-syncope, patient denies passing out or nearly passing out.  She describe more weakness and dizziness.  Plan for labs, orthostatics.  2:26 AM Patient with increased heart rate from 93-120 with standing.  She has been drinking water here.  She has not had any nausea or vomiting.  I've encouraged patient to increase her fluid intake.  Patient is frustrated as she has still not gotten results or diagnosis of her ongoing symptoms.  I have strongly encouraged her to follow-up with a primary care doctor for further workup.  I have reminded her that her TSH is  still pending.  She has been instructed to eat small frequent meals throughout the day and not eat only one meal.  She has been advised that dieting at this time, it may not be in her best interest as she is having ongoing symptoms of weakness and dizziness.  Patient's hemoglobin is improving.   Kalman Drape, MD 06/17/14 [phone]

## 2014-07-14 ENCOUNTER — Emergency Department (HOSPITAL_COMMUNITY)
Admission: EM | Admit: 2014-07-14 | Discharge: 2014-07-14 | Disposition: A | Discharge status: Home / Self Care | Payer: Medicaid Other | Attending: Emergency Medicine | Admitting: Emergency Medicine

## 2014-07-14 ENCOUNTER — Encounter (HOSPITAL_COMMUNITY): Payer: Self-pay | Admitting: Oncology

## 2014-07-14 DIAGNOSIS — R441 Visual hallucinations: Secondary | ICD-10-CM | POA: Diagnosis present

## 2014-07-14 DIAGNOSIS — Z720 Tobacco use: Secondary | ICD-10-CM | POA: Diagnosis not present

## 2014-07-14 DIAGNOSIS — Z79899 Other long term (current) drug therapy: Secondary | ICD-10-CM | POA: Insufficient documentation

## 2014-07-14 DIAGNOSIS — R443 Hallucinations, unspecified: Secondary | ICD-10-CM | POA: Diagnosis not present

## 2014-07-14 DIAGNOSIS — F250 Schizoaffective disorder, bipolar type: Secondary | ICD-10-CM | POA: Diagnosis not present

## 2014-07-14 DIAGNOSIS — R630 Anorexia: Secondary | ICD-10-CM | POA: Insufficient documentation

## 2014-07-14 DIAGNOSIS — G479 Sleep disorder, unspecified: Secondary | ICD-10-CM | POA: Diagnosis not present

## 2014-07-14 DIAGNOSIS — E010 Iodine-deficiency related diffuse (endemic) goiter: Secondary | ICD-10-CM | POA: Insufficient documentation

## 2014-07-14 DIAGNOSIS — Z7951 Long term (current) use of inhaled steroids: Secondary | ICD-10-CM | POA: Diagnosis not present

## 2014-07-14 DIAGNOSIS — Z8719 Personal history of other diseases of the digestive system: Secondary | ICD-10-CM | POA: Diagnosis not present

## 2014-07-14 LAB — COMPREHENSIVE METABOLIC PANEL
ALT: 12 U/L (ref 0–35)
AST: 18 U/L (ref 0–37)
Albumin: 3.9 g/dL (ref 3.5–5.2)
Alkaline Phosphatase: 66 U/L (ref 39–117)
Anion gap: 7 (ref 5–15)
BILIRUBIN TOTAL: 0.4 mg/dL (ref 0.3–1.2)
BUN: 9 mg/dL (ref 6–23)
CO2: 28 mmol/L (ref 19–32)
Calcium: 9 mg/dL (ref 8.4–10.5)
Chloride: 105 mmol/L (ref 96–112)
Creatinine, Ser: 0.8 mg/dL (ref 0.50–1.10)
GFR calc non Af Amer: 88 mL/min — ABNORMAL LOW (ref >90)
Glucose, Bld: 98 mg/dL (ref 70–99)
POTASSIUM: 3.4 mmol/L — AB (ref 3.5–5.1)
SODIUM: 140 mmol/L (ref 135–145)
Total Protein: 7.6 g/dL (ref 6.0–8.3)

## 2014-07-14 LAB — CBC
HEMATOCRIT: 28 % — AB (ref 36.0–46.0)
Hemoglobin: 8.9 g/dL — ABNORMAL LOW (ref 12.0–15.0)
MCH: 25.8 pg — ABNORMAL LOW (ref 26.0–34.0)
MCHC: 31.8 g/dL (ref 30.0–36.0)
MCV: 81.2 fL (ref 78.0–100.0)
Platelets: 380 10*3/uL (ref 150–400)
RBC: 3.45 MIL/uL — ABNORMAL LOW (ref 3.87–5.11)
RDW: 16.7 % — ABNORMAL HIGH (ref 11.5–15.5)
WBC: 11.3 10*3/uL — AB (ref 4.0–10.5)

## 2014-07-14 LAB — RAPID URINE DRUG SCREEN, HOSP PERFORMED
Amphetamines: NOT DETECTED
Barbiturates: NOT DETECTED
Benzodiazepines: NOT DETECTED
Cocaine: NOT DETECTED
Opiates: NOT DETECTED
TETRAHYDROCANNABINOL: NOT DETECTED

## 2014-07-14 LAB — ACETAMINOPHEN LEVEL: Acetaminophen (Tylenol), Serum: <10 ug/mL — ABNORMAL LOW (ref 10–30)

## 2014-07-14 LAB — SALICYLATE LEVEL: Salicylate Lvl: <4 mg/dL (ref 2.8–20.0)

## 2014-07-14 LAB — ETHANOL: Alcohol, Ethyl (B): <5 mg/dL (ref 0–9)

## 2014-07-14 MED ORDER — ACETAMINOPHEN 325 MG PO TABS
650.0000 mg | ORAL_TABLET | ORAL | Status: DC | PRN
Start: 2014-07-14 — End: 2014-07-14

## 2014-07-14 MED ORDER — NICOTINE 21 MG/24HR TD PT24: 21.0000 mg | MEDICATED_PATCH | Freq: Every day | TRANSDERMAL | Status: DC

## 2014-07-14 MED ORDER — ZOLPIDEM TARTRATE 5 MG PO TABS: 5.0000 mg | ORAL_TABLET | Freq: Every evening | ORAL | Status: DC | PRN

## 2014-07-14 MED ORDER — IBUPROFEN 200 MG PO TABS: 600.0000 mg | ORAL_TABLET | Freq: Three times a day (TID) | ORAL | Status: DC | PRN

## 2014-07-14 MED ORDER — ONDANSETRON HCL 4 MG PO TABS: 4.0000 mg | ORAL_TABLET | Freq: Three times a day (TID) | ORAL | Status: DC | PRN

## 2014-07-14 MED ORDER — ALUM & MAG HYDROXIDE-SIMETH 200-200-20 MG/5ML PO SUSP: 30.0000 mL | ORAL | Status: DC | PRN

## 2014-07-14 NOTE — ED Notes (Signed)
Pt cannot verbalize why she is here.  She is A&O x 4 however her responses are delayed. Pt endorses hearing voices that say they are going to kill her.

## 2014-07-14 NOTE — ED Provider Notes (Signed)
CSN: [id number]     Arrival date & time 07/14/14  0434 History   First MD Initiated Contact with Patient 07/14/14 [phone]     Chief Complaint  Patient presents with  . Mind Feels Weird      (Consider location/radiation/quality/duration/timing/severity/associated sxs/prior Treatment) HPI   The patient is a 44 y/o female with a history of schizophrenia and depression who presents to the emergency department for her "head feeling full of things." She states that this began a few weeks ago. Her head does not hurt. This morning she called EMS because she felt like she needed to, but cannot express why. She states that she has not been taking her prescribed Abilify or Seroquel but cannot say for how long or why. She has not run out of her medication. She endorses decreased appetite and trouble sleeping for several weeks. She denies feeling depressed or hopeless. She denies memory loss and confusion. She endorses trouble focusing as her head "feels full." She denies SI/HI.  She endorses a visual hallucination of a black spot that moves around. She initially endorses auditory hallucinations, but then denies it. She does not feel unsafe or mistrustful. She denies any somatic complaints.  She smokes 1/2 ppd of cigarettes. She denies alcohol use or other substance use.   Past Medical History  Diagnosis Date  . Depression   . Bipolar 1 disorder   . Hemorrhoid   . Transfusion history   . Schizophrenia    Past Surgical History  Procedure Laterality Date  . Cesarean section     Family History  Problem Relation Age of Onset  . Diabetes type II Other    History  Substance Use Topics  . Smoking status: Current Every Day Smoker -- 0.50 packs/day for 17 years    Types: Cigarettes  . Smokeless tobacco: Never Used  . Alcohol Use: No   OB History    No data available     Review of Systems  Constitutional: Positive for appetite change. Negative for fever, chills and fatigue.  HENT: Negative for  congestion, ear pain and sinus pressure.   Eyes: Negative for photophobia and pain.  Respiratory: Negative for cough, chest tightness and shortness of breath.   Cardiovascular: Negative for chest pain and palpitations.  Gastrointestinal: Negative for nausea, vomiting, abdominal pain, diarrhea, constipation and abdominal distention.  Endocrine: Negative for cold intolerance and heat intolerance.  Musculoskeletal: Negative for myalgias and arthralgias.  Neurological: Negative for dizziness, light-headedness and headaches.  Psychiatric/Behavioral: Positive for sleep disturbance. Negative for suicidal ideas, confusion and self-injury.    Allergies  Flagyl and Sulfa antibiotics  Home Medications   Prior to Admission medications   Medication Sig Start Date End Date Taking? Authorizing Provider  albuterol (PROVENTIL HFA;VENTOLIN HFA) 108 (90 BASE) MCG/ACT inhaler Inhale 2 puffs into the lungs every 6 (six) hours as needed for wheezing or shortness of breath.   Yes Historical Provider, MD  ARIPiprazole (ABILIFY) 5 MG tablet Take 5 mg by mouth daily.   Yes Historical Provider, MD  ferrous sulfate 325 (65 FE) MG EC tablet Take 325 mg by mouth daily with breakfast.   Yes Historical Provider, MD  Fluticasone-Salmeterol (ADVAIR) 100-50 MCG/DOSE AEPB Inhale 1 puff into the lungs 2 (two) times daily.   Yes Historical Provider, MD  Multiple Vitamin (MULTIVITAMIN WITH MINERALS) TABS tablet Take 1 tablet by mouth daily.   Yes Historical Provider, MD  QUEtiapine (SEROQUEL) 400 MG tablet Take 400 mg by mouth at bedtime.  Historical Provider, MD  QUEtiapine (SEROQUEL) 50 MG tablet Take 1 tablet (50 mg total) by mouth 3 (three) times daily. Patient not taking: Reported on 05/21/2014 02/17/14   Shuvon B Rankin, NP   BP 138/57 mmHg  Pulse 63  Temp(Src) 98.3 F (36.8 C) (Oral)  Resp 18  SpO2 97%  LMP 06/25/2014 Physical Exam  Constitutional: She appears well-developed and well-nourished. No distress.   HENT:  Head: Normocephalic and atraumatic.  Right Ear: Tympanic membrane and external ear normal.  Left Ear: Tympanic membrane and external ear normal.  Eyes: Conjunctivae and EOM are normal. Pupils are equal, round, and reactive to light. No scleral icterus.  Neck: Normal range of motion. Neck supple. Thyromegaly present. No thyroid mass present.  Cardiovascular: Normal rate, regular rhythm, normal heart sounds and intact distal pulses.  Exam reveals no gallop and no friction rub.   No murmur heard. Pulmonary/Chest: Effort normal and breath sounds normal. No respiratory distress.  Musculoskeletal: Normal range of motion. She exhibits no edema.  Neurological: She is alert. She has normal strength. No cranial nerve deficit or sensory deficit. Coordination normal.  Oriented to time but not place or person  Skin: Skin is warm and dry. No rash noted. She is not diaphoretic. No erythema.  Psychiatric: Her affect is labile. Her speech is delayed. She is slowed. Thought content is not paranoid and not delusional. Cognition and memory are not impaired. She expresses no homicidal and no suicidal ideation.  Flat affect with periods of tearfulness and laughter. Speech is delayed with periods of inattention before an answer is given; often needs questions repeated She is inattentive.  Nursing note and vitals reviewed.   ED Course  Procedures (including critical care time) Labs Review Labs Reviewed  ACETAMINOPHEN LEVEL - Abnormal; Notable for the following:    Acetaminophen (Tylenol), Serum <10.0 (*)    All other components within normal limits  CBC - Abnormal; Notable for the following:    WBC 11.3 (*)    RBC 3.45 (*)    Hemoglobin 8.9 (*)    HCT 28.0 (*)    MCH 25.8 (*)    RDW 16.7 (*)    All other components within normal limits  COMPREHENSIVE METABOLIC PANEL - Abnormal; Notable for the following:    Potassium 3.4 (*)    GFR calc non Af Amer 88 (*)    All other components within normal  limits  ETHANOL  SALICYLATE LEVEL  URINE RAPID DRUG SCREEN (HOSP PERFORMED)   Patient will need TTS evaluation    Dalia Heading, PA-C 07/14/14 [place], MD 07/16/14 [phone]

## 2014-07-14 NOTE — ED Notes (Signed)
telepsych in progress 

## 2014-07-14 NOTE — Consult Note (Signed)
[facility] Consult   Reason for Consult:  Hallucinations Referring Physician:  EDP Patient Identification: [name] MRN:  [id number] Principal Diagnosis: Schizoaffective disorder, bipolar type Diagnosis:   Patient Active Problem List   Diagnosis Date Noted  . Hallucinations [R44.3]     Priority: High  . Delusions [F22] 02/15/2014    Priority: High  . Anxiety [F41.9] 01/27/2013    Priority: High  . Schizoaffective disorder, bipolar type [F25.0] 12/12/2006    Priority: High  . Hypertension [I10] 02/24/2011  . ANEMIA-IRON DEFICIENCY [D50.9] 12/13/2006  . GERD [K21.9] 12/13/2006  . OBESITY [E66.9] 12/12/2006  . HEMORRHOIDS, NOS [K64.9] 12/12/2006  . PAIN-NECK [M54.2] 12/12/2006  . TREMOR [R25.9] 12/12/2006    Total Time spent with patient: 45 minutes  Subjective:   [name] is a 44 y.o. female patient does not warrant admission.  HPI:  The patient felt like she was going "out" (passing out), dizzy, fatigued.  She stated in the ED she had heard voices and was sent to the psychiatric ED.  Today, she stated she had not heard voices, "not really voices just was going out."  [name] does have low levels of hemoglobin which contributed to her dizziness, fatigue, and feelings of passing out.  Medically cleared prior to psychiatric admission.  Her ACT team was contacted and made aware of her condition to assure she would follow-up with her PCP.  [name] denies suicidal/homicidal ideations, hallucinations, and alcohol/drug issues.  Stable for discharge. HPI Elements:   Location:  generalized. Quality:  acute. Severity:  mild. Timing:  intermittent. Duration:  brief. Context:  stress of passing out.  Past Medical History:  Past Medical History  Diagnosis Date  . Depression   . Bipolar 1 disorder   . Hemorrhoid   . Transfusion history   . Schizophrenia     Past Surgical History  Procedure Laterality Date  . Cesarean section     Family History:   Family History  Problem Relation Age of Onset  . Diabetes type II Other    Social History:  History  Alcohol Use No     History  Drug Use No    History   Social History  . Marital Status: Legally Separated    Spouse Name: N/A  . Number of Children: N/A  . Years of Education: N/A   Social History Main Topics  . Smoking status: Current Every Day Smoker -- 0.50 packs/day for 17 years    Types: Cigarettes  . Smokeless tobacco: Never Used  . Alcohol Use: No  . Drug Use: No  . Sexual Activity: Yes   Other Topics Concern  . None   Social History Narrative   ** Merged History Encounter **       Additional Social History:    History of alcohol / drug use?:  (Denies use or hx or use)                     Allergies:   Allergies  Allergen Reactions  . Flagyl [Metronidazole Hcl] Nausea Only  . Sulfa Antibiotics Nausea Only    nausea    Labs:  Results for orders placed or performed during the hospital encounter of 07/14/14 (from the past 48 hour(s))  Urine Drug Screen     Status: None   Collection Time: 07/14/14  5:38 AM  Result Value Ref Range   Opiates NONE DETECTED NONE DETECTED   Cocaine NONE DETECTED NONE DETECTED   Benzodiazepines NONE  DETECTED NONE DETECTED   Amphetamines NONE DETECTED NONE DETECTED   Tetrahydrocannabinol NONE DETECTED NONE DETECTED   Barbiturates NONE DETECTED NONE DETECTED    Comment:        DRUG SCREEN FOR MEDICAL PURPOSES ONLY.  IF CONFIRMATION IS NEEDED FOR ANY PURPOSE, NOTIFY LAB WITHIN 5 DAYS.        LOWEST DETECTABLE LIMITS FOR URINE DRUG SCREEN Drug Class       Cutoff (ng/mL) Amphetamine      1000 Barbiturate      200 Benzodiazepine   295 Tricyclics       188 Opiates          300 Cocaine          300 THC              50   Acetaminophen level     Status: Abnormal   Collection Time: 07/14/14  6:57 AM  Result Value Ref Range   Acetaminophen (Tylenol), Serum <10.0 (L) 10 - 30 ug/mL    Comment:        THERAPEUTIC  CONCENTRATIONS VARY SIGNIFICANTLY. A RANGE OF 10-30 ug/mL MAY BE AN EFFECTIVE CONCENTRATION FOR MANY PATIENTS. HOWEVER, SOME ARE BEST TREATED AT CONCENTRATIONS OUTSIDE THIS RANGE. ACETAMINOPHEN CONCENTRATIONS >150 ug/mL AT 4 HOURS AFTER INGESTION AND >50 ug/mL AT 12 HOURS AFTER INGESTION ARE OFTEN ASSOCIATED WITH TOXIC REACTIONS.   CBC     Status: Abnormal   Collection Time: 07/14/14  6:57 AM  Result Value Ref Range   WBC 11.3 (H) 4.0 - 10.5 K/uL   RBC 3.45 (L) 3.87 - 5.11 MIL/uL   Hemoglobin 8.9 (L) 12.0 - 15.0 g/dL   HCT 28.0 (L) 36.0 - 46.0 %   MCV 81.2 78.0 - 100.0 fL   MCH 25.8 (L) 26.0 - 34.0 pg   MCHC 31.8 30.0 - 36.0 g/dL   RDW 16.7 (H) 11.5 - 15.5 %   Platelets 380 150 - 400 K/uL  Comprehensive metabolic panel     Status: Abnormal   Collection Time: 07/14/14  6:57 AM  Result Value Ref Range   Sodium 140 135 - 145 mmol/L   Potassium 3.4 (L) 3.5 - 5.1 mmol/L   Chloride 105 96 - 112 mmol/L   CO2 28 19 - 32 mmol/L   Glucose, Bld 98 70 - 99 mg/dL   BUN 9 6 - 23 mg/dL   Creatinine, Ser 0.80 0.50 - 1.10 mg/dL   Calcium 9.0 8.4 - 10.5 mg/dL   Total Protein 7.6 6.0 - 8.3 g/dL   Albumin 3.9 3.5 - 5.2 g/dL   AST 18 0 - 37 U/L   ALT 12 0 - 35 U/L   Alkaline Phosphatase 66 39 - 117 U/L   Total Bilirubin 0.4 0.3 - 1.2 mg/dL   GFR calc non Af Amer 88 (L) >90 mL/min   GFR calc Af Amer >90 >90 mL/min    Comment: (NOTE) The eGFR has been calculated using the CKD EPI equation. This calculation has not been validated in all clinical situations. eGFR's persistently <90 mL/min signify possible Chronic Kidney Disease.    Anion gap 7 5 - 15  Ethanol (ETOH)     Status: None   Collection Time: 07/14/14  6:57 AM  Result Value Ref Range   Alcohol, Ethyl (B) <5 0 - 9 mg/dL    Comment:        LOWEST DETECTABLE LIMIT FOR SERUM ALCOHOL IS 11 mg/dL FOR MEDICAL PURPOSES ONLY   Salicylate  level     Status: None   Collection Time: 07/14/14  6:57 AM  Result Value Ref Range    Salicylate Lvl <7.6 2.8 - 20.0 mg/dL    Vitals: Blood pressure 139/56, pulse 70, temperature 97.6 F (36.4 C), temperature source Oral, resp. rate 17, last menstrual period 06/25/2014, SpO2 99 %.  Risk to Self: Suicidal Ideation: No Suicidal Intent: No Is patient at risk for suicide?: No Suicidal Plan?: No Access to Means: No What has been your use of drugs/alcohol within the last 12 months?: denies use or hx of use How many times?:  (UTA) Other Self Harm Risks: confused  Triggers for Past Attempts: Unpredictable Intentional Self Injurious Behavior: None Risk to Others: Homicidal Ideation: No Thoughts of Harm to Others: No Current Homicidal Intent: No Current Homicidal Plan: No Access to Homicidal Means: No Identified Victim: none History of harm to others?: No Assessment of Violence: None Noted Violent Behavior Description: none Does patient have access to weapons?: No Criminal Charges Pending?: No Does patient have a court date: No Prior Inpatient Therapy: Prior Inpatient Therapy: Yes Prior Therapy Dates: 2015 Multiple admissions Prior Therapy Facilty/Provider(s): [facility],  Reason for Treatment: Psychosis Prior Outpatient Therapy: Prior Outpatient Therapy: Yes Prior Therapy Dates: unknown Prior Therapy Facilty/Provider(s): ACT team  Reason for Treatment: schizoaffective disorder  Current Facility-Administered Medications  Medication Dose Route Frequency Provider Last Rate Last Dose  . acetaminophen (TYLENOL) tablet 650 mg  650 mg Oral Q4H PRN Dalia Heading, PA-C      . alum & mag hydroxide-simeth (MAALOX/MYLANTA) 200-200-20 MG/5ML suspension 30 mL  30 mL Oral PRN Dalia Heading, PA-C      . ibuprofen (ADVIL,MOTRIN) tablet 600 mg  600 mg Oral Q8H PRN Dalia Heading, PA-C      . nicotine (NICODERM CQ - dosed in mg/24 hours) patch 21 mg  21 mg Transdermal Daily Christopher Lawyer, PA-C      . ondansetron (ZOFRAN) tablet 4 mg  4 mg Oral Q8H PRN Dalia Heading,  PA-C      . zolpidem (AMBIEN) tablet 5 mg  5 mg Oral QHS PRN Dalia Heading, PA-C       Current Outpatient Prescriptions  Medication Sig Dispense Refill  . albuterol (PROVENTIL HFA;VENTOLIN HFA) 108 (90 BASE) MCG/ACT inhaler Inhale 2 puffs into the lungs every 6 (six) hours as needed for wheezing or shortness of breath.    . ARIPiprazole (ABILIFY) 5 MG tablet Take 5 mg by mouth daily.    . ferrous sulfate 325 (65 FE) MG EC tablet Take 325 mg by mouth daily with breakfast.    . Fluticasone-Salmeterol (ADVAIR) 100-50 MCG/DOSE AEPB Inhale 1 puff into the lungs 2 (two) times daily.    . Multiple Vitamin (MULTIVITAMIN WITH MINERALS) TABS tablet Take 1 tablet by mouth daily.    . QUEtiapine (SEROQUEL) 400 MG tablet Take 400 mg by mouth at bedtime.    Marland Kitchen QUEtiapine (SEROQUEL) 50 MG tablet Take 1 tablet (50 mg total) by mouth 3 (three) times daily. (Patient not taking: Reported on 05/21/2014) 42 tablet 0   Musculoskeletal: Strength & Muscle Tone: within normal limits Gait & Station: normal Patient leans: N/A  Psychiatric Specialty Exam:     Blood pressure 139/56, pulse 70, temperature 97.6 F (36.4 C), temperature source Oral, resp. rate 17, last menstrual period 06/25/2014, SpO2 99 %.There is no weight on file to calculate BMI.  General Appearance: Casual  Eye Contact::  Good  Speech:  Normal Rate409  Volume:  Normal  Mood:  Euthymic  Affect:  Blunt  Thought Process:  Coherent  Orientation:  Full (Time, Place, and Person)  Thought Content:  WDL  Suicidal Thoughts:  No  Homicidal Thoughts:  No  Memory:  Immediate;   Good Recent;   Good Remote;   Good  Judgement:  Fair  Insight:  Fair  Psychomotor Activity:  Normal  Concentration:  Good  Recall:  Good  Fund of Knowledge:Good  Language: Good  Akathisia:  No  Handed:  Right  AIMS (if indicated):     Assets:  Housing Leisure Time Resilience Social Support  Sleep:     Cognition: WNL  ADL's:  Intact   Medical Decision  Making: Review of Psycho-Social Stressors (1), Review or order clinical lab tests (1) and Review of Medication Regimen & Side Effects (2)  Treatment Plan Summary: Daily contact with patient to assess and evaluate symptoms and progress in treatment, Medication management and Plan Discharge home with follow-up with her PCP and ACT team  Plan:  No evidence of imminent risk to self or others at present.   Disposition: Discharge home with follow-up with her PCP and ACT team  Waylan Boga, [place] 07/14/2014 4:16 PM Patient seen face-to-face for psychiatric evaluation, chart reviewed and case discussed with the physician extender and developed treatment plan. Reviewed the information documented and agree with the treatment plan. Corena Pilgrim, MD

## 2014-07-14 NOTE — ED Notes (Signed)
Per EMS they were called to pt's residence for a c/o CP, 12 lead on scene was unremarkable.  Pt then said her head and throat hurt, later pt told EMS that she was lying and her head and throat did not hurt.  Finally pt reported to EMS that her, "mind felt weird."

## 2014-07-14 NOTE — ED Notes (Signed)
Dr a and jamison into see

## 2014-07-14 NOTE — BH Assessment (Addendum)
Tele Assessment Note   [name] is an 44 y.o. female who came to the emergency department after calling 911 due to reports of "feeling bad". Pt was not able to verbalize what she meant by this and could not recall the events that lead up to her calling the ambulance. Pt seemed confused during assessment. Pt was suspicious and reluctant to talk to counselor. She denied SI, HI and A/V hallucinations. She also denied substance abuse or any history of use. Her UDS was negative and BAL <5. Pt states that she lives alone and would like to go home now. Her speech was soft and delayed and at times wouldn't answer the counselor when she asked a question. Pt has a history of inpatient admissions and was last in [facility] in 2015 for paranoia and psychotic symptoms.   Disposition- Discharge home per Dr. [name].   Axis I: 295.70 Schizoaffective Disorder Axis II: Deferred Axis III:  Past Medical History  Diagnosis Date  . Depression   . Bipolar 1 disorder   . Hemorrhoid   . Transfusion history   . Schizophrenia    Axis IV: economic problems and other psychosocial or environmental problems Axis V: 31-40 impairment in reality testing  Past Medical History:  Past Medical History  Diagnosis Date  . Depression   . Bipolar 1 disorder   . Hemorrhoid   . Transfusion history   . Schizophrenia     Past Surgical History  Procedure Laterality Date  . Cesarean section      Family History:  Family History  Problem Relation Age of Onset  . Diabetes type II Other     Social History:  reports that she has been smoking Cigarettes.  She has a 8.5 pack-year smoking history. She has never used smokeless tobacco. She reports that she does not drink alcohol or use illicit drugs.  Additional Social History:  Alcohol / Drug Use History of alcohol / drug use?:  (Denies use or hx or use)  CIWA: CIWA-Ar BP: 139/56 mmHg Pulse Rate: 70 COWS:    PATIENT STRENGTHS: (choose at least two) Average or  above average intelligence General fund of knowledge  Allergies:  Allergies  Allergen Reactions  . Flagyl [Metronidazole Hcl] Nausea Only  . Sulfa Antibiotics Nausea Only    nausea    Home Medications:  (Not in a hospital admission)  OB/GYN Status:  Patient's last menstrual period was 06/25/2014.  General Assessment Data Location of Assessment: [facility] ED Is this a Tele or Face-to-Face Assessment?: Tele Assessment Is this an Initial Assessment or a Re-assessment for this encounter?: Initial Assessment Living Arrangements: Alone Can pt return to current living arrangement?: Yes Admission Status: Voluntary Is patient capable of signing voluntary admission?: Yes Transfer from: Home Referral Source: Self/Family/Friend     [facility] Living Arrangements: Alone Name of Psychiatrist: ACT team Name of Therapist: ACT team  Education Status Is patient currently in school?: No Highest grade of school patient has completed: UTA  Risk to self with the past 6 months Suicidal Ideation: No Suicidal Intent: No Is patient at risk for suicide?: No Suicidal Plan?: No Access to Means: No What has been your use of drugs/alcohol within the last 12 months?: denies use or hx of use Previous Attempts/Gestures: Yes How many times?:  (UTA) Other Self Harm Risks: confused  Triggers for Past Attempts: Unpredictable Intentional Self Injurious Behavior: None Family Suicide History: Unable to assess Recent stressful life event(s): Financial Problems Persecutory voices/beliefs?:  (Denies-  provider concerned) Depression: Yes Depression Symptoms: Despondent Substance abuse history and/or treatment for substance abuse?: No Suicide prevention information given to non-admitted patients: Not applicable  Risk to Others within the past 6 months Homicidal Ideation: No Thoughts of Harm to Others: No Current Homicidal Intent: No Current Homicidal Plan: No Access to Homicidal Means:  No Identified Victim: none History of harm to others?: No Assessment of Violence: None Noted Violent Behavior Description: none Does patient have access to weapons?: No Criminal Charges Pending?: No Does patient have a court date: No  Psychosis Hallucinations:  (denies ) Delusions:  (denies)  Mental Status Report Appearance/Hygiene: Bizarre, In scrubs Eye Contact: Good Motor Activity: Psychomotor retardation Speech: Slow Level of Consciousness: Alert Mood: Depressed, Empty Affect: Blunted Anxiety Level: None Thought Processes: Irrelevant Judgement: Unable to Assess Orientation: Person, Place Obsessive Compulsive Thoughts/Behaviors: None  Cognitive Functioning Concentration: Decreased Memory: Recent Intact, Remote Intact IQ: Average Insight: Poor Impulse Control: Fair Appetite: Good Weight Loss: 0 Weight Gain: 0 Sleep: Decreased Total Hours of Sleep:  (uta) Vegetative Symptoms: Unable to Assess  ADLScreening [facility]) Patient's cognitive ability adequate to safely complete daily activities?: Yes Patient able to express need for assistance with ADLs?: Yes Independently performs ADLs?: Yes (appropriate for developmental age)  Prior Inpatient Therapy Prior Inpatient Therapy: Yes Prior Therapy Dates: 2015 Multiple admissions Prior Therapy Facilty/Provider(s): [facility],  Reason for Treatment: Psychosis  Prior Outpatient Therapy Prior Outpatient Therapy: Yes Prior Therapy Dates: unknown Prior Therapy Facilty/Provider(s): ACT team  Reason for Treatment: schizoaffective disorder  ADL Screening (condition at time of admission) Patient's cognitive ability adequate to safely complete daily activities?: Yes Is the patient deaf or have difficulty hearing?: No Does the patient have difficulty seeing, even when wearing glasses/contacts?: No Does the patient have difficulty concentrating, remembering, or making decisions?: Yes Patient able to express need for  assistance with ADLs?: Yes Does the patient have difficulty dressing or bathing?: No Independently performs ADLs?: Yes (appropriate for developmental age) Does the patient have difficulty walking or climbing stairs?: No Weakness of Legs: None Weakness of Arms/Hands: None  Home Assistive Devices/Equipment Home Assistive Devices/Equipment: None    Abuse/Neglect Assessment (Assessment to be complete while patient is alone) Physical Abuse:  (UTA) Verbal Abuse:  (UTA) Sexual Abuse:  (UTA) Exploitation of patient/patient's resources:  (UTA) Self-Neglect:  (UTA) Values / Beliefs Cultural Requests During Hospitalization: None Spiritual Requests During Hospitalization: None Consults Spiritual Care Consult Needed: No Advance Directives (For Healthcare) Does patient have an advance directive?: No Would patient like information on creating an advanced directive?: No - patient declined information    Additional Information 1:1 In Past 12 Months?: No CIRT Risk: No Elopement Risk: No Does patient have medical clearance?: Yes     Disposition:  Disposition Initial Assessment Completed for this Encounter: Yes Disposition of Patient: Outpatient treatment Type of outpatient treatment:  (Return to care of ACT team )  Roberth Berling 07/14/2014 1:45 PM

## 2014-07-14 NOTE — ED Notes (Signed)
[place] specialist at bedside talking to pt. Verbally responsive. Resp even and unlabored. ABC's intact. No behavior problems noted. NAD noted.

## 2014-07-14 NOTE — ED Notes (Signed)
Pt very quiet and only some yes/no questions. Resp even and unlabored. No audible adventitious breath sounds noted. Pt cooperative and calm. No behavior problems at this time. Pt placed in paper scrubs. Personal items bagged, labled, and placed at NS.

## 2014-07-14 NOTE — ED Notes (Addendum)
Written dc instructions reviewed w/ pt.  Pt encouraged to take her medications as, follow up w/ her PMD, and return/seek treatment for suicidal/homicidal/avh.  Pt verbalized understanding.  Pt ambulatory w/o difficulty to dc window, belongings returned after leaving the unit. Bus pass given

## 2014-07-14 NOTE — [facility] Suicide Risk Assessment (Signed)
Suicide Risk Assessment  Discharge Assessment   [facility] Discharge Suicide Risk Assessment   Demographic Factors:  Living alone  Total Time spent with patient: 45 minutes  Musculoskeletal: Strength & Muscle Tone: within normal limits Gait & Station: normal Patient leans: N/A  Psychiatric Specialty Exam:     Blood pressure 139/56, pulse 70, temperature 97.6 F (36.4 C), temperature source Oral, resp. rate 17, last menstrual period 06/25/2014, SpO2 99 %.There is no weight on file to calculate BMI.  General Appearance: Casual  Eye Contact::  Good  Speech:  Normal Rate409  Volume:  Normal  Mood:  Euthymic  Affect:  Blunt  Thought Process:  Coherent  Orientation:  Full (Time, Place, and Person)  Thought Content:  WDL  Suicidal Thoughts:  No  Homicidal Thoughts:  No  Memory:  Immediate;   Good Recent;   Good Remote;   Good  Judgement:  Fair  Insight:  Fair  Psychomotor Activity:  Normal  Concentration:  Good  Recall:  Good  Fund of Knowledge:Good  Language: Good  Akathisia:  No  Handed:  Right  AIMS (if indicated):     Assets:  Housing Leisure Time Resilience Social Support  Sleep:     Cognition: WNL  ADL's:  Intact      Has this patient used any form of tobacco in the last 30 days? (Cigarettes, Smokeless Tobacco, Cigars, and/or Pipes) No  Mental Status Per Nursing Assessment::   On Admission:   Dizzy, felt like she was going to pass out, hallucinations  Current Mental Status by Physician: NA  Loss Factors: NA  Historical Factors: NA  Risk Reduction Factors:   Sense of responsibility to family, Positive social support, Positive therapeutic relationship and Positive coping skills or problem solving skills  Continued Clinical Symptoms:  None  Cognitive Features That Contribute To Risk:  None    Suicide Risk:  Minimal: No identifiable suicidal ideation.  Patients presenting with no risk factors but with morbid ruminations; may be classified as minimal  risk based on the severity of the depressive symptoms  Principal Problem: Schizoaffective disorder, bipolar type Discharge Diagnoses:  Patient Active Problem List   Diagnosis Date Noted  . Hallucinations [R44.3]     Priority: High  . Delusions [F22] 02/15/2014    Priority: High  . Anxiety [F41.9] 01/27/2013    Priority: High  . Schizoaffective disorder, bipolar type [F25.0] 12/12/2006    Priority: High  . Hypertension [I10] 02/24/2011  . ANEMIA-IRON DEFICIENCY [D50.9] 12/13/2006  . GERD [K21.9] 12/13/2006  . OBESITY [E66.9] 12/12/2006  . HEMORRHOIDS, NOS [K64.9] 12/12/2006  . PAIN-NECK [M54.2] 12/12/2006  . TREMOR [R25.9] 12/12/2006      Plan Of Care/Follow-up recommendations:  Activity:  as tolerated Diet:  heart healthy diet  Is patient on multiple antipsychotic therapies at discharge:  No   Has Patient had three or more failed trials of antipsychotic monotherapy by history:  No  Recommended Plan for Multiple Antipsychotic Therapies: NA    Augustin Bun, PMH-NP 07/14/2014, 4:08 PM

## 2014-07-21 ENCOUNTER — Emergency Department (HOSPITAL_COMMUNITY)
Admission: EM | Admit: 2014-07-21 | Discharge: 2014-07-21 | Discharge status: Against Medical Advice | Payer: Medicaid Other | Attending: Emergency Medicine | Admitting: Emergency Medicine

## 2014-07-21 ENCOUNTER — Encounter (HOSPITAL_COMMUNITY): Payer: Self-pay | Admitting: Emergency Medicine

## 2014-07-21 DIAGNOSIS — R0602 Shortness of breath: Secondary | ICD-10-CM | POA: Insufficient documentation

## 2014-07-21 DIAGNOSIS — R0981 Nasal congestion: Secondary | ICD-10-CM | POA: Diagnosis present

## 2014-07-21 DIAGNOSIS — M79601 Pain in right arm: Secondary | ICD-10-CM | POA: Insufficient documentation

## 2014-07-21 DIAGNOSIS — Z720 Tobacco use: Secondary | ICD-10-CM | POA: Diagnosis not present

## 2014-07-21 DIAGNOSIS — M79604 Pain in right leg: Secondary | ICD-10-CM | POA: Insufficient documentation

## 2014-07-21 DIAGNOSIS — R0789 Other chest pain: Secondary | ICD-10-CM | POA: Insufficient documentation

## 2014-07-21 NOTE — ED Notes (Signed)
Upon entry to room pt sleeping soundly on stretcher in NAD.  Upon waking pt c/o L ear pain

## 2014-07-21 NOTE — ED Notes (Signed)
Per EMS, Patient c/o chest "heaviness" r/t congestion x1 day. Patient called 911 saying she was SOB. Lungs CTA, VS WNL, c/o R arm and R leg pain. Patient ambulated to ambulance unassisted upon EMS arrival to her home.

## 2014-07-21 NOTE — ED Notes (Signed)
Called for pt x 2 in [place], no answer

## 2014-08-01 ENCOUNTER — Encounter: Payer: Self-pay | Admitting: Advanced Practice Midwife

## 2014-11-28 ENCOUNTER — Emergency Department (HOSPITAL_COMMUNITY)
Admission: EM | Admit: 2014-11-28 | Discharge: 2014-11-28 | Disposition: A | Discharge status: Home / Self Care | Payer: Medicaid Other | Attending: Emergency Medicine | Admitting: Emergency Medicine

## 2014-11-28 ENCOUNTER — Encounter (HOSPITAL_COMMUNITY): Payer: Self-pay | Admitting: Emergency Medicine

## 2014-11-28 DIAGNOSIS — Z8719 Personal history of other diseases of the digestive system: Secondary | ICD-10-CM | POA: Diagnosis not present

## 2014-11-28 DIAGNOSIS — Z720 Tobacco use: Secondary | ICD-10-CM | POA: Diagnosis not present

## 2014-11-28 DIAGNOSIS — Z79899 Other long term (current) drug therapy: Secondary | ICD-10-CM | POA: Diagnosis not present

## 2014-11-28 DIAGNOSIS — Z3202 Encounter for pregnancy test, result negative: Secondary | ICD-10-CM | POA: Diagnosis not present

## 2014-11-28 DIAGNOSIS — R102 Pelvic and perineal pain: Secondary | ICD-10-CM | POA: Diagnosis not present

## 2014-11-28 DIAGNOSIS — N898 Other specified noninflammatory disorders of vagina: Secondary | ICD-10-CM | POA: Diagnosis not present

## 2014-11-28 DIAGNOSIS — Z711 Person with feared health complaint in whom no diagnosis is made: Secondary | ICD-10-CM

## 2014-11-28 DIAGNOSIS — F319 Bipolar disorder, unspecified: Secondary | ICD-10-CM | POA: Diagnosis not present

## 2014-11-28 DIAGNOSIS — F209 Schizophrenia, unspecified: Secondary | ICD-10-CM | POA: Diagnosis not present

## 2014-11-28 DIAGNOSIS — Z202 Contact with and (suspected) exposure to infections with a predominantly sexual mode of transmission: Secondary | ICD-10-CM | POA: Diagnosis not present

## 2014-11-28 DIAGNOSIS — R350 Frequency of micturition: Secondary | ICD-10-CM | POA: Insufficient documentation

## 2014-11-28 LAB — URINALYSIS, ROUTINE W REFLEX MICROSCOPIC
Bilirubin Urine: NEGATIVE
GLUCOSE, UA: NEGATIVE mg/dL
Hgb urine dipstick: NEGATIVE
KETONES UR: NEGATIVE mg/dL
LEUKOCYTES UA: NEGATIVE
Nitrite: NEGATIVE
PH: 5.5 (ref 5.0–8.0)
Protein, ur: NEGATIVE mg/dL
SPECIFIC GRAVITY, URINE: 1.024 (ref 1.005–1.030)
Urobilinogen, UA: 0.2 mg/dL (ref 0.0–1.0)

## 2014-11-28 LAB — WET PREP, GENITAL
CLUE CELLS WET PREP: NONE SEEN
Trich, Wet Prep: NONE SEEN
Yeast Wet Prep HPF POC: NONE SEEN

## 2014-11-28 LAB — POC URINE PREG, ED: PREG TEST UR: NEGATIVE

## 2014-11-28 MED ORDER — CEFTRIAXONE SODIUM 250 MG IJ SOLR
250.0000 mg | Freq: Once | INTRAMUSCULAR | Status: AC
  Administered 2014-11-28: 250 mg via INTRAMUSCULAR
  Filled 2014-11-28: qty 250

## 2014-11-28 MED ORDER — STERILE WATER FOR INJECTION IJ SOLN
INTRAMUSCULAR | Status: AC
  Filled 2014-11-28: qty 10

## 2014-11-28 MED ORDER — AZITHROMYCIN 250 MG PO TABS
1000.0000 mg | ORAL_TABLET | Freq: Once | ORAL | Status: AC
  Administered 2014-11-28: 1000 mg via ORAL
  Filled 2014-11-28: qty 4

## 2014-11-28 NOTE — Discharge Instructions (Signed)

## 2014-11-28 NOTE — ED Notes (Addendum)
Pt was possibly exposed to unknown STD 2.5 weeks ago. Denies s/s before this time. Having pelvic pain and pressure. Also c/o malodorous discharge but has not seen discolored discharge. Also c/o bilateral lower back pain. Denies dysuria, only urinary frequency. Unknown if sexual partner has STD. No other c/c. Is allergic to Flagyl of note. Denies N/V/D/Fever.

## 2014-11-28 NOTE — ED Provider Notes (Signed)
CSN: [id number]     Arrival date & time 11/28/14  2045 History   First MD Initiated Contact with Patient 11/28/14 2209     Chief Complaint  Patient presents with  . Exposure to STD  . Pelvic Pain     (Consider location/radiation/quality/duration/timing/severity/associated sxs/prior Treatment) HPI Comments: 44 year old female presenting with concerns of STD exposure. Reports having intercourse with a new partner 2-1/2 weeks ago, started off as protected intercourse but he "took it off". Since then, she's had a foul-smelling vaginal discharge along with intermittent pelvic cramping. No aggravating or alleviating factors. Admits to increased urinary frequency. Denies dysuria, hematuria, vaginal bleeding, nausea, vomiting, fever or chills. LMP 11/08/2014.  Patient is a 44 y.o. female presenting with STD exposure and pelvic pain. The history is provided by the patient.  Exposure to STD  Pelvic Pain    Past Medical History  Diagnosis Date  . Depression   . Bipolar 1 disorder   . Hemorrhoid   . Transfusion history   . Schizophrenia    Past Surgical History  Procedure Laterality Date  . Cesarean section     Family History  Problem Relation Age of Onset  . Diabetes type II Other    Social History  Substance Use Topics  . Smoking status: Current Every Day Smoker -- 0.50 packs/day for 17 years    Types: Cigarettes  . Smokeless tobacco: Never Used  . Alcohol Use: No   OB History    No data available     Review of Systems  Genitourinary: Positive for frequency, vaginal discharge and pelvic pain.  All other systems reviewed and are negative.     Allergies  Latuda; Flagyl; and Sulfa antibiotics  Home Medications   Prior to Admission medications   Medication Sig Start Date End Date Taking? Authorizing Provider  chlorproMAZINE (THORAZINE) 100 MG tablet Take 100 mg by mouth 2 (two) times daily.   Yes Historical Provider, MD  ferrous sulfate 325 (65 FE) MG EC tablet Take  325 mg by mouth daily with breakfast.   Yes Historical Provider, MD  Multiple Vitamin (MULTIVITAMIN WITH MINERALS) TABS tablet Take 1 tablet by mouth daily.   Yes Historical Provider, MD  pyridOXINE (VITAMIN B-6) 100 MG tablet Take 100 mg by mouth daily.   Yes Historical Provider, MD  albuterol (PROVENTIL HFA;VENTOLIN HFA) 108 (90 BASE) MCG/ACT inhaler Inhale 2 puffs into the lungs every 6 (six) hours as needed for wheezing or shortness of breath.    Historical Provider, MD  QUEtiapine (SEROQUEL) 50 MG tablet Take 1 tablet (50 mg total) by mouth 3 (three) times daily. Patient not taking: Reported on 05/21/2014 02/17/14   Shuvon B Rankin, NP   BP 150/82 mmHg  Pulse 84  Temp(Src) 98.4 F (36.9 C) (Oral)  Resp 16  SpO2 100%  LMP 11/08/2014 (Exact Date) Physical Exam  Constitutional: She is oriented to person, place, and time. She appears well-developed and well-nourished. No distress.  Obese.  HENT:  Head: Normocephalic and atraumatic.  Mouth/Throat: Oropharynx is clear and moist.  Eyes: Conjunctivae and EOM are normal.  Neck: Normal range of motion. Neck supple.  Cardiovascular: Normal rate, regular rhythm and normal heart sounds.   Pulmonary/Chest: Effort normal and breath sounds normal. No respiratory distress.  Genitourinary: Uterus is not tender. Cervix exhibits no motion tenderness, no discharge and no friability. Right adnexum displays no tenderness. Left adnexum displays no tenderness. No bleeding in the vagina. Vaginal discharge (white, clumpy) found.  Musculoskeletal: Normal range of  motion. She exhibits no edema.  Neurological: She is alert and oriented to person, place, and time. No sensory deficit.  Skin: Skin is warm and dry.  Psychiatric: She has a normal mood and affect. Her behavior is normal.  Nursing note and vitals reviewed.   ED Course  Procedures (including critical care time) Labs Review Labs Reviewed  WET PREP, GENITAL - Abnormal; Notable for the following:     WBC, Wet Prep HPF POC RARE (*)    All other components within normal limits  URINALYSIS, ROUTINE W REFLEX MICROSCOPIC (NOT AT [facility]) - Abnormal; Notable for the following:    APPearance CLOUDY (*)    All other components within normal limits  RPR  HIV ANTIBODY (ROUTINE TESTING)  POC URINE PREG, ED  GC/CHLAMYDIA PROBE AMP ([place]) NOT AT [facility]    Imaging Review No results found. I, Lucien Mons, personally reviewed and evaluated these images and lab results as part of my medical decision-making.   EKG Interpretation None      MDM   Final diagnoses:  Concern about STD in female without diagnosis   Nontoxic appearing, NAD. AFVSS. Abdomen soft and nontender. No CMT or adnexal tenderness concerning for PID. No cervical discharge concerning for cervicitis. Patient concerned about STDs and requesting treatment. Rocephin and azithromycin given in the ED. Cultures pending. Wet prep negative. Safe sexual practices discussed. Stable for discharge. Return precautions given. Patient states understanding of treatment care plan and is agreeable.  Carman Ching, PA-C 11/28/14 2258  Merrily Pew, MD 11/29/14 [phone]

## 2014-11-29 LAB — HIV ANTIBODY (ROUTINE TESTING W REFLEX): HIV Screen 4th Generation wRfx: NONREACTIVE

## 2014-11-29 LAB — GC/CHLAMYDIA PROBE AMP (~~LOC~~) NOT AT ARMC
Chlamydia: NEGATIVE
Neisseria Gonorrhea: NEGATIVE

## 2014-11-29 LAB — RPR: RPR Ser Ql: NONREACTIVE

## 2015-02-23 ENCOUNTER — Emergency Department (HOSPITAL_COMMUNITY)
Admission: EM | Admit: 2015-02-23 | Discharge: 2015-02-24 | Disposition: A | Discharge status: Home / Self Care | Payer: Medicaid Other | Attending: Emergency Medicine | Admitting: Emergency Medicine

## 2015-02-23 ENCOUNTER — Encounter (HOSPITAL_COMMUNITY): Payer: Self-pay | Admitting: Family Medicine

## 2015-02-23 DIAGNOSIS — F419 Anxiety disorder, unspecified: Secondary | ICD-10-CM | POA: Diagnosis not present

## 2015-02-23 DIAGNOSIS — Z720 Tobacco use: Secondary | ICD-10-CM | POA: Diagnosis not present

## 2015-02-23 DIAGNOSIS — Z8739 Personal history of other diseases of the musculoskeletal system and connective tissue: Secondary | ICD-10-CM | POA: Diagnosis not present

## 2015-02-23 DIAGNOSIS — T39395A Adverse effect of other nonsteroidal anti-inflammatory drugs [NSAID], initial encounter: Secondary | ICD-10-CM | POA: Insufficient documentation

## 2015-02-23 DIAGNOSIS — F209 Schizophrenia, unspecified: Secondary | ICD-10-CM | POA: Insufficient documentation

## 2015-02-23 DIAGNOSIS — J0100 Acute maxillary sinusitis, unspecified: Secondary | ICD-10-CM | POA: Insufficient documentation

## 2015-02-23 DIAGNOSIS — Z79899 Other long term (current) drug therapy: Secondary | ICD-10-CM | POA: Diagnosis not present

## 2015-02-23 DIAGNOSIS — K2960 Other gastritis without bleeding: Secondary | ICD-10-CM

## 2015-02-23 DIAGNOSIS — K2970 Gastritis, unspecified, without bleeding: Secondary | ICD-10-CM | POA: Diagnosis not present

## 2015-02-23 DIAGNOSIS — G8929 Other chronic pain: Secondary | ICD-10-CM | POA: Diagnosis not present

## 2015-02-23 DIAGNOSIS — M545 Low back pain: Secondary | ICD-10-CM | POA: Diagnosis present

## 2015-02-23 HISTORY — DX: Bronchitis, not specified as acute or chronic: J40

## 2015-02-23 HISTORY — DX: Unspecified osteoarthritis, unspecified site: M19.90

## 2015-02-23 HISTORY — DX: Schizoaffective disorder, unspecified: F25.9

## 2015-02-23 MED ORDER — GI COCKTAIL ~~LOC~~
30.0000 mL | Freq: Once | ORAL | Status: AC
  Administered 2015-02-23: 30 mL via ORAL
  Filled 2015-02-23: qty 30

## 2015-02-23 MED ORDER — PANTOPRAZOLE SODIUM 40 MG IV SOLR
40.0000 mg | Freq: Once | INTRAVENOUS | Status: AC
  Administered 2015-02-24: 40 mg via INTRAVENOUS
  Filled 2015-02-23: qty 40

## 2015-02-23 MED ORDER — ONDANSETRON HCL 4 MG/2ML IJ SOLN
4.0000 mg | Freq: Once | INTRAMUSCULAR | Status: AC
  Administered 2015-02-23: 4 mg via INTRAVENOUS
  Filled 2015-02-23: qty 2

## 2015-02-23 MED ORDER — MORPHINE SULFATE (PF) 4 MG/ML IV SOLN
4.0000 mg | Freq: Once | INTRAVENOUS | Status: AC
  Administered 2015-02-23: 4 mg via INTRAVENOUS
  Filled 2015-02-23: qty 1

## 2015-02-23 NOTE — ED Provider Notes (Signed)
CSN: [id number]   Arrival date & time 02/23/15 2218  History  By signing my name below, I, Altamease Oiler, attest that this documentation has been prepared under the direction and in the presence of Julianne Rice, MD. Electronically Signed: Altamease Oiler, ED Scribe. 02/23/2015. 11:53 PM.  Chief Complaint  Patient presents with  . Back Pain    HPI The history is provided by the patient. No language interpreter was used.   Brought in by EMS, [name] is a 44 y.o. female who presents to the Emergency Department complaining of recurrent lower back pain with onset this morning upon waking. Pain does not radiate down the legs. She associates the pain with taking one 500 mg dose of Naproxen last night for chronic arthritis. The constant pain worsened tonight and radiates to the upper abdomen. She describes the pain as spasms. Movement exacerbates the pain. She has been seen for the pain in the past and told that she had some bone degeneration and spasms. She denies frequent use of Naproxen. Last night she ate [facility]. Also complains of bilateral sinus pressure, subjective fever for weeks, chills, nausea, diarrhea, and urinary frequency. Pt denies vomiting, focal weakness, saddle anesthesia, hematuria, and incontinence of bowel or bladder.   Past Medical History  Diagnosis Date  . Depression   . Bipolar 1 disorder ([place])   . Hemorrhoid   . Transfusion history   . Schizophrenia ([place])     Pt denies and reports it is schizoaffective disorder.   . Arthritis   . Bronchitis   . Schizoaffective disorder [facility])     Past Surgical History  Procedure Laterality Date  . Cesarean section      Family History  Problem Relation Age of Onset  . Diabetes type II Other     Social History  Substance Use Topics  . Smoking status: Current Every Day Smoker -- 0.50 packs/day for 17 years    Types: Cigarettes  . Smokeless tobacco: Never Used  . Alcohol Use: No     Review of Systems   Constitutional: Positive for fever and chills. Negative for fatigue.  HENT: Positive for congestion and sinus pressure. Negative for sore throat.   Respiratory: Negative for cough and shortness of breath.   Cardiovascular: Negative for chest pain.  Gastrointestinal: Positive for nausea, abdominal pain and diarrhea. Negative for vomiting, constipation and blood in stool.  Genitourinary: Positive for frequency. Negative for dysuria, hematuria, flank pain, difficulty urinating and pelvic pain.  Musculoskeletal: Positive for myalgias, back pain and arthralgias. Negative for gait problem, neck pain and neck stiffness.  Skin: Negative for rash and wound.  Neurological: Negative for dizziness, weakness, light-headedness, numbness and headaches.  All other systems reviewed and are negative.  Home Medications   Prior to Admission medications   Medication Sig Start Date End Date Taking? Authorizing Provider  albuterol (PROVENTIL HFA;VENTOLIN HFA) 108 (90 BASE) MCG/ACT inhaler Inhale 2 puffs into the lungs every 6 (six) hours as needed for wheezing or shortness of breath.   Yes Historical Provider, MD  chlorproMAZINE (THORAZINE) 100 MG tablet Take 100 mg by mouth 2 (two) times daily.   Yes Historical Provider, MD  ferrous sulfate 325 (65 FE) MG EC tablet Take 325 mg by mouth daily with breakfast.   Yes Historical Provider, MD  Multiple Vitamin (MULTIVITAMIN WITH MINERALS) TABS tablet Take 1 tablet by mouth daily.   Yes Historical Provider, MD  pyridOXINE (VITAMIN B-6) 100 MG tablet Take 100 mg by mouth daily.  Yes Historical Provider, MD  azithromycin (ZITHROMAX) 250 MG tablet Take 1 tablet (250 mg total) by mouth daily. Take first 2 tablets together, then 1 every day until finished. 02/24/15   Julianne Rice, MD  loratadine (CLARITIN) 10 MG tablet Take 1 tablet (10 mg total) by mouth daily. 02/24/15   Julianne Rice, MD  methocarbamol (ROBAXIN) 500 MG tablet Take 2 tablets (1,000 mg total) by mouth  every 8 (eight) hours as needed for muscle spasms. 02/24/15   Julianne Rice, MD  mometasone (NASONEX) 50 MCG/ACT nasal spray Place 2 sprays into the nose daily. 02/24/15   Julianne Rice, MD  ondansetron (ZOFRAN ODT) 4 MG disintegrating tablet 4mg  ODT q4 hours prn nausea/vomit 02/24/15   Julianne Rice, MD  oxymetazoline [facility] NASAL SPRAY) 0.05 % nasal spray Place 1 spray into both nostrils 2 (two) times daily. Do not use more than 3 days 02/24/15   Julianne Rice, MD  pantoprazole (PROTONIX) 20 MG tablet Take 1 tablet (20 mg total) by mouth daily. 02/24/15   Julianne Rice, MD    Allergies  Anette Guarneri; Flagyl; and Sulfa antibiotics  Triage Vitals: BP 154/77 mmHg  Pulse 92  Temp(Src) 100.3 F (37.9 C) (Oral)  Resp 18  Ht 5\' 4"  (1.626 m)  Wt 252 lb (114.306 kg)  BMI 43.23 kg/m2  SpO2 97%  LMP 02/08/2015  Physical Exam  Constitutional: She is oriented to person, place, and time. She appears well-developed and well-nourished. No distress.  HENT:  Head: Normocephalic and atraumatic.  Mouth/Throat: Oropharynx is clear and moist. No oropharyngeal exudate.  Bilateral frontal and maxillary sinus tenderness to percussion. Bilateral nasal mucosal edema.  Eyes: EOM are normal. Pupils are equal, round, and reactive to light.  Neck: Normal range of motion. Neck supple.  No meningismus  Cardiovascular: Normal rate and regular rhythm.  Exam reveals no gallop and no friction rub.   No murmur heard. Pulmonary/Chest: Effort normal and breath sounds normal. No respiratory distress. She has no wheezes. She has no rales. She exhibits no tenderness.  Abdominal: Soft. Bowel sounds are normal. She exhibits no distension and no mass. There is tenderness (epigastric tenderness with palpation.). There is no rebound and no guarding.  Musculoskeletal: Normal range of motion. She exhibits tenderness. She exhibits no edema.  Patient with diffuse midline lumbar tenderness to palpation. Patient also has questionable  bilateral CVA tenderness. No lower extremity swelling or pain. Distal pulses intact.  Lymphadenopathy:    She has no cervical adenopathy.  Neurological: She is alert and oriented to person, place, and time.  Patient with 5/5 motor in all extremities. Per EMS ambulating without difficulty. Sensation is fully intact.  Skin: Skin is warm and dry. No rash noted. No erythema.  Psychiatric: Her behavior is normal.  Appears anxious and mildly agitated  Nursing note and vitals reviewed.   ED Course  Procedures   DIAGNOSTIC STUDIES: Oxygen Saturation is 97% on RA, normal by my interpretation.    COORDINATION OF CARE: 11:31 PM Discussed treatment plan which includes lab work, GI cocktail, Protonix, Zofran, and morphine with pt at bedside and pt agreed to plan.  Labs Reviewed  CBC WITH DIFFERENTIAL/PLATELET - Abnormal; Notable for the following:    WBC 11.4 (*)    RBC 3.59 (*)    Hemoglobin 8.6 (*)    HCT 27.4 (*)    MCV 76.3 (*)    MCH 24.0 (*)    RDW 19.3 (*)    Lymphs Abs 5.2 (*)    All  other components within normal limits  COMPREHENSIVE METABOLIC PANEL - Abnormal; Notable for the following:    Potassium 3.3 (*)    Calcium 8.8 (*)    AST 14 (*)    ALT 9 (*)    All other components within normal limits  URINALYSIS, ROUTINE W REFLEX MICROSCOPIC (NOT AT [facility])  LIPASE, BLOOD    Imaging Review No results found.  I personally reviewed and evaluated these images and lab results as a part of my medical decision-making.   MDM   Final diagnoses:  NSAID induced gastritis  Chronic low back pain  Acute maxillary sinusitis, recurrence not specified    I personally performed the services described in this documentation, which was scribed in my presence. The recorded information has been reviewed and is accurate.    Patient's upper abdominal discomfort is resolved with GI cocktail and Protonix. She states her back is feeling much better. She maintains a normal neurologic exam.  Patient does have a low-grade fever in the emergency department. She has evidence of sinusitis by history and on exam. I have low suspicion for infectious process contributing to her ongoing chronic back pain.  No melena or gross blood per history. Will start on PPI and have follow-up with gastroenterology. Likely due to NSAID use for arthritis. Advised to take Tylenol for pain. Start on antibiotic, Claritin and several nasal sprays for ongoing sinusitis.   She's been given return precautions and has voice understanding.  Julianne Rice, MD 02/24/15 [phone]

## 2015-02-23 NOTE — ED Notes (Signed)
Pt is from home and transported by [facility]. Pt is complaining of lower back pain that radiates to her abd. Pain has occurred all day. Took Naproxen last night for arthritis pain. Today, she is experiencing muscle spasms of lower back. Pt has been using moist heat to the area with no relief. Pt was ambulatory to the ambulance, climbed into the truck. While transporting, pt started complaining of nausea with no vomiting.

## 2015-02-23 NOTE — ED Notes (Signed)
Provider at bedside

## 2015-02-23 NOTE — ED Notes (Signed)
Bed: OM85 Expected date:  Expected time:  Means of arrival:  Comments: EMS F abd pain

## 2015-02-24 LAB — COMPREHENSIVE METABOLIC PANEL
ALBUMIN: 3.8 g/dL (ref 3.5–5.0)
ALK PHOS: 60 U/L (ref 38–126)
ALT: 9 U/L — ABNORMAL LOW (ref 14–54)
ANION GAP: 7 (ref 5–15)
AST: 14 U/L — AB (ref 15–41)
BUN: 6 mg/dL (ref 6–20)
CALCIUM: 8.8 mg/dL — AB (ref 8.9–10.3)
CO2: 24 mmol/L (ref 22–32)
Chloride: 108 mmol/L (ref 101–111)
Creatinine, Ser: 0.85 mg/dL (ref 0.44–1.00)
GFR calc Af Amer: >60 mL/min (ref >60)
GFR calc non Af Amer: >60 mL/min (ref >60)
GLUCOSE: 91 mg/dL (ref 65–99)
POTASSIUM: 3.3 mmol/L — AB (ref 3.5–5.1)
SODIUM: 139 mmol/L (ref 135–145)
Total Bilirubin: 0.4 mg/dL (ref 0.3–1.2)
Total Protein: 7.4 g/dL (ref 6.5–8.1)

## 2015-02-24 LAB — URINALYSIS, ROUTINE W REFLEX MICROSCOPIC
Bilirubin Urine: NEGATIVE
Glucose, UA: NEGATIVE mg/dL
Hgb urine dipstick: NEGATIVE
Ketones, ur: NEGATIVE mg/dL
LEUKOCYTES UA: NEGATIVE
Nitrite: NEGATIVE
PH: 6.5 (ref 5.0–8.0)
Protein, ur: NEGATIVE mg/dL
SPECIFIC GRAVITY, URINE: 1.018 (ref 1.005–1.030)
Urobilinogen, UA: 0.2 mg/dL (ref 0.0–1.0)

## 2015-02-24 LAB — CBC WITH DIFFERENTIAL/PLATELET
BASOS PCT: 0 %
Basophils Absolute: 0.1 10*3/uL (ref 0.0–0.1)
EOS ABS: 0 10*3/uL (ref 0.0–0.7)
Eosinophils Relative: 0 %
HCT: 27.4 % — ABNORMAL LOW (ref 36.0–46.0)
Hemoglobin: 8.6 g/dL — ABNORMAL LOW (ref 12.0–15.0)
Lymphocytes Relative: 46 %
Lymphs Abs: 5.2 10*3/uL — ABNORMAL HIGH (ref 0.7–4.0)
MCH: 24 pg — ABNORMAL LOW (ref 26.0–34.0)
MCHC: 31.4 g/dL (ref 30.0–36.0)
MCV: 76.3 fL — ABNORMAL LOW (ref 78.0–100.0)
MONOS PCT: 5 %
Monocytes Absolute: 0.6 10*3/uL (ref 0.1–1.0)
Neutro Abs: 5.5 10*3/uL (ref 1.7–7.7)
Neutrophils Relative %: 49 %
Platelets: 351 10*3/uL (ref 150–400)
RBC: 3.59 MIL/uL — ABNORMAL LOW (ref 3.87–5.11)
RDW: 19.3 % — ABNORMAL HIGH (ref 11.5–15.5)
WBC: 11.4 10*3/uL — AB (ref 4.0–10.5)

## 2015-02-24 LAB — LIPASE, BLOOD: Lipase: 20 U/L (ref 11–51)

## 2015-02-24 MED ORDER — ONDANSETRON 4 MG PO TBDP: ORAL_TABLET | ORAL | Status: DC

## 2015-02-24 MED ORDER — OXYMETAZOLINE HCL 0.05 % NA SOLN: 1.0000 | Freq: Two times a day (BID) | NASAL | Status: DC

## 2015-02-24 MED ORDER — LORATADINE 10 MG PO TABS: 10.0000 mg | ORAL_TABLET | Freq: Every day | ORAL | Status: DC

## 2015-02-24 MED ORDER — METHOCARBAMOL 500 MG PO TABS: 1000.0000 mg | ORAL_TABLET | Freq: Three times a day (TID) | ORAL | Status: DC | PRN

## 2015-02-24 MED ORDER — METHOCARBAMOL 500 MG PO TABS
1000.0000 mg | ORAL_TABLET | Freq: Once | ORAL | Status: AC
  Administered 2015-02-24: 1000 mg via ORAL
  Filled 2015-02-24: qty 2

## 2015-02-24 MED ORDER — ACETAMINOPHEN 325 MG PO TABS
650.0000 mg | ORAL_TABLET | ORAL | Status: DC | PRN
  Administered 2015-02-24: 650 mg via ORAL
  Filled 2015-02-24: qty 2

## 2015-02-24 MED ORDER — PANTOPRAZOLE SODIUM 20 MG PO TBEC: 20.0000 mg | DELAYED_RELEASE_TABLET | Freq: Every day | ORAL | Status: DC

## 2015-02-24 MED ORDER — AZITHROMYCIN 250 MG PO TABS: 250.0000 mg | ORAL_TABLET | Freq: Every day | ORAL | Status: DC

## 2015-02-24 MED ORDER — MOMETASONE FUROATE 50 MCG/ACT NA SUSP: 2.0000 | Freq: Every day | NASAL | Status: DC

## 2015-02-24 NOTE — Discharge Instructions (Signed)
You may take Tylenol for pain and fever. Stop using naproxen and all NSAIDs including ibuprofen. You may take over-the-counter Mylanta or Maalox for upper abdominal pain. Call and make an appointment to follow-up with the gastroenterologist. Return immediately for any black or bloody stools. Return immediately for worsening abdominal pain, leg weakness or numbness.   Chronic Back Pain  When back pain lasts longer than 3 months, it is called chronic back pain.People with chronic back pain often go through certain periods that are more intense (flare-ups).  CAUSES Chronic back pain can be caused by wear and tear (degeneration) on different structures in your back. These structures include:  The bones of your spine (vertebrae) and the joints surrounding your spinal cord and nerve roots (facets).  The strong, fibrous tissues that connect your vertebrae (ligaments). Degeneration of these structures may result in pressure on your nerves. This can lead to constant pain. HOME CARE INSTRUCTIONS  Avoid bending, heavy lifting, prolonged sitting, and activities which make the problem worse.  Take brief periods of rest throughout the day to reduce your pain. Lying down or standing usually is better than sitting while you are resting.  Take over-the-counter or prescription medicines only as directed by your caregiver. SEEK IMMEDIATE MEDICAL CARE IF:   You have weakness or numbness in one of your legs or feet.  You have trouble controlling your bladder or bowels.  You have nausea, vomiting, abdominal pain, shortness of breath, or fainting.   This information is not intended to replace advice given to you by your health care provider. Make sure you discuss any questions you have with your health care provider.   Document Released: 05/13/2004 Document Revised: 06/28/2011 Document Reviewed: 09/23/2014 Elsevier Interactive Patient Education 2016 Elsevier Inc.  Gastritis, Adult Gastritis is soreness  and swelling (inflammation) of the lining of the stomach. Gastritis can develop as a sudden onset (acute) or long-term (chronic) condition. If gastritis is not treated, it can lead to stomach bleeding and ulcers. CAUSES  Gastritis occurs when the stomach lining is weak or damaged. Digestive juices from the stomach then inflame the weakened stomach lining. The stomach lining may be weak or damaged due to viral or bacterial infections. One common bacterial infection is the Helicobacter pylori infection. Gastritis can also result from excessive alcohol consumption, taking certain medicines, or having too much acid in the stomach.  SYMPTOMS  In some cases, there are no symptoms. When symptoms are present, they may include:  Pain or a burning sensation in the upper abdomen.  Nausea.  Vomiting.  An uncomfortable feeling of fullness after eating. DIAGNOSIS  Your caregiver may suspect you have gastritis based on your symptoms and a physical exam. To determine the cause of your gastritis, your caregiver may perform the following:  Blood or stool tests to check for the H pylori bacterium.  Gastroscopy. A thin, flexible tube (endoscope) is passed down the esophagus and into the stomach. The endoscope has a light and camera on the end. Your caregiver uses the endoscope to view the inside of the stomach.  Taking a tissue sample (biopsy) from the stomach to examine under a microscope. TREATMENT  Depending on the cause of your gastritis, medicines may be prescribed. If you have a bacterial infection, such as an H pylori infection, antibiotics may be given. If your gastritis is caused by too much acid in the stomach, H2 blockers or antacids may be given. Your caregiver may recommend that you stop taking aspirin, ibuprofen, or other nonsteroidal  anti-inflammatory drugs (NSAIDs). HOME CARE INSTRUCTIONS  Only take over-the-counter or prescription medicines as directed by your caregiver.  If you were given  antibiotic medicines, take them as directed. Finish them even if you start to feel better.  Drink enough fluids to keep your urine clear or pale yellow.  Avoid foods and drinks that make your symptoms worse, such as:  Caffeine or alcoholic drinks.  Chocolate.  Peppermint or mint flavorings.  Garlic and onions.  Spicy foods.  Citrus fruits, such as oranges, lemons, or limes.  Tomato-based foods such as sauce, chili, salsa, and pizza.  Fried and fatty foods.  Eat small, frequent meals instead of large meals. SEEK IMMEDIATE MEDICAL CARE IF:   You have black or dark red stools.  You vomit blood or material that looks like coffee grounds.  You are unable to keep fluids down.  Your abdominal pain gets worse.  You have a fever.  You do not feel better after 1 week.  You have any other questions or concerns. MAKE SURE YOU:  Understand these instructions.  Will watch your condition.  Will get help right away if you are not doing well or get worse.   This information is not intended to replace advice given to you by your health care provider. Make sure you discuss any questions you have with your health care provider.   Document Released: 03/30/2001 Document Revised: 10/05/2011 Document Reviewed: 05/19/2011 Elsevier Interactive Patient Education 2016 [employer].  Sinusitis, Adult Sinusitis is redness, soreness, and inflammation of the paranasal sinuses. Paranasal sinuses are air pockets within the bones of your face. They are located beneath your eyes, in the middle of your forehead, and above your eyes. In healthy paranasal sinuses, mucus is able to drain out, and air is able to circulate through them by way of your nose. However, when your paranasal sinuses are inflamed, mucus and air can become trapped. This can allow bacteria and other germs to grow and cause infection. Sinusitis can develop quickly and last only a short time (acute) or continue over a long period  (chronic). Sinusitis that lasts for more than 12 weeks is considered chronic. CAUSES Causes of sinusitis include:  Allergies.  Structural abnormalities, such as displacement of the cartilage that separates your nostrils (deviated septum), which can decrease the air flow through your nose and sinuses and affect sinus drainage.  Functional abnormalities, such as when the small hairs (cilia) that line your sinuses and help remove mucus do not work properly or are not present. SIGNS AND SYMPTOMS Symptoms of acute and chronic sinusitis are the same. The primary symptoms are pain and pressure around the affected sinuses. Other symptoms include:  Upper toothache.  Earache.  Headache.  Bad breath.  Decreased sense of smell and taste.  A cough, which worsens when you are lying flat.  Fatigue.  Fever.  Thick drainage from your nose, which often is green and may contain pus (purulent).  Swelling and warmth over the affected sinuses. DIAGNOSIS Your health care provider will perform a physical exam. During your exam, your health care provider may perform any of the following to help determine if you have acute sinusitis or chronic sinusitis:  Look in your nose for signs of abnormal growths in your nostrils (nasal polyps).  Tap over the affected sinus to check for signs of infection.  View the inside of your sinuses using an imaging device that has a light attached (endoscope). If your health care provider suspects that you have  chronic sinusitis, one or more of the following tests may be recommended:  Allergy tests.  Nasal culture. A sample of mucus is taken from your nose, sent to a lab, and screened for bacteria.  Nasal cytology. A sample of mucus is taken from your nose and examined by your health care provider to determine if your sinusitis is related to an allergy. TREATMENT Most cases of acute sinusitis are related to a viral infection and will resolve on their own within 10  days. Sometimes, medicines are prescribed to help relieve symptoms of both acute and chronic sinusitis. These may include pain medicines, decongestants, nasal steroid sprays, or saline sprays. However, for sinusitis related to a bacterial infection, your health care provider will prescribe antibiotic medicines. These are medicines that will help kill the bacteria causing the infection. Rarely, sinusitis is caused by a fungal infection. In these cases, your health care provider will prescribe antifungal medicine. For some cases of chronic sinusitis, surgery is needed. Generally, these are cases in which sinusitis recurs more than 3 times per year, despite other treatments. HOME CARE INSTRUCTIONS  Drink plenty of water. Water helps thin the mucus so your sinuses can drain more easily.  Use a humidifier.  Inhale steam 3-4 times a day (for example, sit in the bathroom with the shower running).  Apply a warm, moist washcloth to your face 3-4 times a day, or as directed by your health care provider.  Use saline nasal sprays to help moisten and clean your sinuses.  Take medicines only as directed by your health care provider.  If you were prescribed either an antibiotic or antifungal medicine, finish it all even if you start to feel better. SEEK IMMEDIATE MEDICAL CARE IF:  You have increasing pain or severe headaches.  You have nausea, vomiting, or drowsiness.  You have swelling around your face.  You have vision problems.  You have a stiff neck.  You have difficulty breathing.   This information is not intended to replace advice given to you by your health care provider. Make sure you discuss any questions you have with your health care provider.   Document Released: 04/05/2005 Document Revised: 04/26/2014 Document Reviewed: 04/20/2011 Elsevier Interactive Patient Education [employer].

## 2015-05-10 ENCOUNTER — Inpatient Hospital Stay (HOSPITAL_COMMUNITY)
Admission: AD | Admit: 2015-05-10 | Discharge: 2015-05-20 | DRG: 885 | Disposition: A | Discharge status: Home / Self Care | Payer: Medicaid Other | Source: Intra-hospital | Attending: Psychiatry | Admitting: Psychiatry

## 2015-05-10 ENCOUNTER — Encounter (HOSPITAL_COMMUNITY): Payer: Self-pay | Admitting: Emergency Medicine

## 2015-05-10 ENCOUNTER — Emergency Department (HOSPITAL_COMMUNITY)
Admission: EM | Admit: 2015-05-10 | Discharge: 2015-05-10 | Disposition: A | Discharge status: Other Acute Inpatient Hospital | Payer: Medicaid Other | Attending: Emergency Medicine | Admitting: Emergency Medicine

## 2015-05-10 DIAGNOSIS — F259 Schizoaffective disorder, unspecified: Secondary | ICD-10-CM | POA: Diagnosis not present

## 2015-05-10 DIAGNOSIS — E876 Hypokalemia: Secondary | ICD-10-CM | POA: Diagnosis present

## 2015-05-10 DIAGNOSIS — F250 Schizoaffective disorder, bipolar type: Secondary | ICD-10-CM | POA: Diagnosis present

## 2015-05-10 DIAGNOSIS — Z3202 Encounter for pregnancy test, result negative: Secondary | ICD-10-CM | POA: Insufficient documentation

## 2015-05-10 DIAGNOSIS — I10 Essential (primary) hypertension: Secondary | ICD-10-CM | POA: Diagnosis present

## 2015-05-10 DIAGNOSIS — Z833 Family history of diabetes mellitus: Secondary | ICD-10-CM | POA: Diagnosis not present

## 2015-05-10 DIAGNOSIS — F419 Anxiety disorder, unspecified: Secondary | ICD-10-CM | POA: Diagnosis present

## 2015-05-10 DIAGNOSIS — G4700 Insomnia, unspecified: Secondary | ICD-10-CM | POA: Diagnosis present

## 2015-05-10 DIAGNOSIS — F17210 Nicotine dependence, cigarettes, uncomplicated: Secondary | ICD-10-CM | POA: Diagnosis present

## 2015-05-10 DIAGNOSIS — Z23 Encounter for immunization: Secondary | ICD-10-CM | POA: Diagnosis not present

## 2015-05-10 DIAGNOSIS — F17200 Nicotine dependence, unspecified, uncomplicated: Secondary | ICD-10-CM | POA: Clinically undetermined

## 2015-05-10 DIAGNOSIS — R443 Hallucinations, unspecified: Secondary | ICD-10-CM

## 2015-05-10 DIAGNOSIS — F22 Delusional disorders: Secondary | ICD-10-CM | POA: Insufficient documentation

## 2015-05-10 DIAGNOSIS — F1220 Cannabis dependence, uncomplicated: Secondary | ICD-10-CM | POA: Clinically undetermined

## 2015-05-10 DIAGNOSIS — Z8719 Personal history of other diseases of the digestive system: Secondary | ICD-10-CM | POA: Insufficient documentation

## 2015-05-10 DIAGNOSIS — Z7951 Long term (current) use of inhaled steroids: Secondary | ICD-10-CM | POA: Insufficient documentation

## 2015-05-10 DIAGNOSIS — Z79899 Other long term (current) drug therapy: Secondary | ICD-10-CM | POA: Insufficient documentation

## 2015-05-10 DIAGNOSIS — Z8709 Personal history of other diseases of the respiratory system: Secondary | ICD-10-CM | POA: Insufficient documentation

## 2015-05-10 DIAGNOSIS — Z8739 Personal history of other diseases of the musculoskeletal system and connective tissue: Secondary | ICD-10-CM | POA: Insufficient documentation

## 2015-05-10 DIAGNOSIS — F209 Schizophrenia, unspecified: Secondary | ICD-10-CM | POA: Insufficient documentation

## 2015-05-10 DIAGNOSIS — R030 Elevated blood-pressure reading, without diagnosis of hypertension: Secondary | ICD-10-CM | POA: Insufficient documentation

## 2015-05-10 DIAGNOSIS — F1210 Cannabis abuse, uncomplicated: Secondary | ICD-10-CM | POA: Insufficient documentation

## 2015-05-10 LAB — COMPREHENSIVE METABOLIC PANEL
ALT: 12 U/L — AB (ref 14–54)
AST: 21 U/L (ref 15–41)
Albumin: 4.2 g/dL (ref 3.5–5.0)
Alkaline Phosphatase: 71 U/L (ref 38–126)
Anion gap: 9 (ref 5–15)
BUN: 9 mg/dL (ref 6–20)
CALCIUM: 9.3 mg/dL (ref 8.9–10.3)
CO2: 26 mmol/L (ref 22–32)
Chloride: 104 mmol/L (ref 101–111)
Creatinine, Ser: 0.77 mg/dL (ref 0.44–1.00)
Glucose, Bld: 108 mg/dL — ABNORMAL HIGH (ref 65–99)
Potassium: 3.1 mmol/L — ABNORMAL LOW (ref 3.5–5.1)
Sodium: 139 mmol/L (ref 135–145)
Total Bilirubin: 0.7 mg/dL (ref 0.3–1.2)
Total Protein: 8.6 g/dL — ABNORMAL HIGH (ref 6.5–8.1)

## 2015-05-10 LAB — RAPID URINE DRUG SCREEN, HOSP PERFORMED
Amphetamines: NOT DETECTED
BARBITURATES: NOT DETECTED
Benzodiazepines: NOT DETECTED
COCAINE: NOT DETECTED
OPIATES: NOT DETECTED
Tetrahydrocannabinol: POSITIVE — AB

## 2015-05-10 LAB — CBC WITH DIFFERENTIAL/PLATELET
BASOS ABS: 0.1 10*3/uL (ref 0.0–0.1)
BASOS PCT: 0 %
EOS ABS: 0.2 10*3/uL (ref 0.0–0.7)
Eosinophils Relative: 1 %
HEMATOCRIT: 30.1 % — AB (ref 36.0–46.0)
Hemoglobin: 9.4 g/dL — ABNORMAL LOW (ref 12.0–15.0)
Lymphocytes Relative: 28 %
Lymphs Abs: 4.1 10*3/uL — ABNORMAL HIGH (ref 0.7–4.0)
MCH: 23.8 pg — ABNORMAL LOW (ref 26.0–34.0)
MCHC: 31.2 g/dL (ref 30.0–36.0)
MCV: 76.2 fL — ABNORMAL LOW (ref 78.0–100.0)
MONO ABS: 0.7 10*3/uL (ref 0.1–1.0)
Monocytes Relative: 5 %
Neutro Abs: 9.6 10*3/uL — ABNORMAL HIGH (ref 1.7–7.7)
Neutrophils Relative %: 66 %
PLATELETS: 382 10*3/uL (ref 150–400)
RBC: 3.95 MIL/uL (ref 3.87–5.11)
RDW: 18.1 % — AB (ref 11.5–15.5)
WBC: 14.6 10*3/uL — AB (ref 4.0–10.5)

## 2015-05-10 LAB — I-STAT BETA HCG BLOOD, ED (MC, WL, AP ONLY): I-stat hCG, quantitative: <5 m[IU]/mL (ref <5)

## 2015-05-10 LAB — ETHANOL: Alcohol, Ethyl (B): <5 mg/dL (ref <5)

## 2015-05-10 MED ORDER — FERROUS SULFATE 325 (65 FE) MG PO TABS
325.0000 mg | ORAL_TABLET | Freq: Every day | ORAL | Status: DC
  Filled 2015-05-10: qty 1

## 2015-05-10 MED ORDER — ZOLPIDEM TARTRATE 5 MG PO TABS: 5.0000 mg | ORAL_TABLET | Freq: Every evening | ORAL | Status: DC | PRN

## 2015-05-10 MED ORDER — ACETAMINOPHEN 325 MG PO TABS
650.0000 mg | ORAL_TABLET | Freq: Four times a day (QID) | ORAL | Status: DC | PRN
  Administered 2015-05-14 – 2015-05-17 (×3): 650 mg via ORAL
  Filled 2015-05-10 (×3): qty 2

## 2015-05-10 MED ORDER — PERPHENAZINE 8 MG PO TABS
8.0000 mg | ORAL_TABLET | Freq: Two times a day (BID) | ORAL | Status: DC
  Administered 2015-05-10: 8 mg via ORAL
  Filled 2015-05-10 (×2): qty 1

## 2015-05-10 MED ORDER — VITAMIN B-6 100 MG PO TABS
100.0000 mg | ORAL_TABLET | Freq: Every day | ORAL | Status: DC
  Administered 2015-05-11 – 2015-05-20 (×10): 100 mg via ORAL
  Filled 2015-05-10 (×12): qty 1

## 2015-05-10 MED ORDER — LORAZEPAM 1 MG PO TABS: 1.0000 mg | ORAL_TABLET | Freq: Three times a day (TID) | ORAL | Status: DC | PRN

## 2015-05-10 MED ORDER — VITAMIN B-6 100 MG PO TABS
100.0000 mg | ORAL_TABLET | Freq: Every day | ORAL | Status: DC
  Administered 2015-05-10: 100 mg via ORAL
  Filled 2015-05-10: qty 1

## 2015-05-10 MED ORDER — ONDANSETRON HCL 4 MG PO TABS: 4.0000 mg | ORAL_TABLET | Freq: Three times a day (TID) | ORAL | Status: DC | PRN

## 2015-05-10 MED ORDER — PERPHENAZINE 8 MG PO TABS
8.0000 mg | ORAL_TABLET | Freq: Two times a day (BID) | ORAL | Status: DC
  Administered 2015-05-11 – 2015-05-14 (×6): 8 mg via ORAL
  Filled 2015-05-10: qty 1
  Filled 2015-05-10: qty 2
  Filled 2015-05-10 (×8): qty 1
  Filled 2015-05-10: qty 2
  Filled 2015-05-10 (×3): qty 1

## 2015-05-10 MED ORDER — ACETAMINOPHEN 325 MG PO TABS: 650.0000 mg | ORAL_TABLET | ORAL | Status: DC | PRN

## 2015-05-10 MED ORDER — ZIPRASIDONE HCL 20 MG PO CAPS
20.0000 mg | ORAL_CAPSULE | Freq: Two times a day (BID) | ORAL | Status: DC
  Administered 2015-05-10: 20 mg via ORAL
  Filled 2015-05-10 (×2): qty 1

## 2015-05-10 MED ORDER — POTASSIUM CHLORIDE CRYS ER 20 MEQ PO TBCR
40.0000 meq | EXTENDED_RELEASE_TABLET | Freq: Once | ORAL | Status: AC
  Administered 2015-05-10: 40 meq via ORAL
  Filled 2015-05-10: qty 2

## 2015-05-10 NOTE — Progress Notes (Signed)
This Probation officer consulted with Dr. Loni Muse, it is recommended to refer for inpatient hospitalization for safety and stabilization.  Patient meets criteria for 500H.   Chesley Noon, MSW, Darlyn Read [facility] Triage Specialist [phone]

## 2015-05-10 NOTE — Progress Notes (Signed)
Pt accepted to [facility] bed 501-1 by Dr. Shea Evans and call report to [phone].     Chesley Noon, MSW, Darlyn Read [facility] Triage Specialist [phone]

## 2015-05-10 NOTE — Progress Notes (Addendum)
Pt ambulatory to [facility] with a steady gait. Presents with flat affect, sullen /sad mood and paranoia. Denies SI, HI, AVH and pain at time of assessment. Pt refused to elaborate on events leading to admission, stated "I'm tired repeating myself". Per chart and report, pt is a 45 y/o AAF with h/o Bipolar 1,Schizophrenia and Schizoaffective D/O.  Presented to the ED with initial c/o HTN. However,  pt has not been med compliant X 2 days related to [facility]. Pt does have an ACTT team. UDS + for THC. Vitals stable without physical distress at present.  Q 15 minutes checks maintained for safety without outburst or self injurious behavior to report thus far. Plan is for inpatient placement for safety and stabilization.

## 2015-05-10 NOTE — ED Notes (Signed)
Upon assessment pt refuses to lift or move legs.

## 2015-05-10 NOTE — ED Provider Notes (Signed)
CSN: [id number]     Arrival date & time 05/10/15  [id number] History   First MD Initiated Contact with Patient 05/10/15 [phone]     Chief Complaint  Patient presents with  . Hypertension     (Consider location/radiation/quality/duration/timing/severity/associated sxs/prior Treatment) HPI   45 year old female who presents today from home stating that she feels like things are all over her and she is hearing voices. She states that they are critical voices that are saying mean things to her. She denies they are telling her to harm herself or anyone else. She denies suicidal ideation or homicidal ideation. She is minimally interactive with the interviewer. The chief complaint entered by nursing states hypertension. She states that she was told she had high blood pressure here. She denies any other physical complaints. She states that these voices have happened in the past. She does not answer clearly about medication questions whether or not she has been taking her medications.  Past Medical History  Diagnosis Date  . Depression   . Bipolar 1 disorder ([place])   . Hemorrhoid   . Transfusion history   . Schizophrenia ([place])     Pt denies and reports it is schizoaffective disorder.   . Arthritis   . Bronchitis   . Schizoaffective disorder [facility])    Past Surgical History  Procedure Laterality Date  . Cesarean section     Family History  Problem Relation Age of Onset  . Diabetes type II Other    Social History  Substance Use Topics  . Smoking status: Current Every Day Smoker -- 0.50 packs/day for 17 years    Types: Cigarettes  . Smokeless tobacco: Never Used  . Alcohol Use: No   OB History    No data available     Review of Systems  Unable to perform ROS: Other      Allergies  Latuda; Flagyl; and Sulfa antibiotics  Home Medications   Prior to Admission medications   Medication Sig Start Date End Date Taking? Authorizing Provider  albuterol (PROVENTIL HFA;VENTOLIN HFA) 108 (90  BASE) MCG/ACT inhaler Inhale 2 puffs into the lungs every 6 (six) hours as needed for wheezing or shortness of breath.    Historical Provider, MD  azithromycin (ZITHROMAX) 250 MG tablet Take 1 tablet (250 mg total) by mouth daily. Take first 2 tablets together, then 1 every day until finished. 02/24/15   Julianne Rice, MD  chlorproMAZINE (THORAZINE) 100 MG tablet Take 100 mg by mouth 2 (two) times daily.    Historical Provider, MD  ferrous sulfate 325 (65 FE) MG EC tablet Take 325 mg by mouth daily with breakfast.    Historical Provider, MD  loratadine (CLARITIN) 10 MG tablet Take 1 tablet (10 mg total) by mouth daily. 02/24/15   Julianne Rice, MD  methocarbamol (ROBAXIN) 500 MG tablet Take 2 tablets (1,000 mg total) by mouth every 8 (eight) hours as needed for muscle spasms. 02/24/15   Julianne Rice, MD  mometasone (NASONEX) 50 MCG/ACT nasal spray Place 2 sprays into the nose daily. 02/24/15   Julianne Rice, MD  Multiple Vitamin (MULTIVITAMIN WITH MINERALS) TABS tablet Take 1 tablet by mouth daily.    Historical Provider, MD  ondansetron (ZOFRAN ODT) 4 MG disintegrating tablet 4mg  ODT q4 hours prn nausea/vomit 02/24/15   Julianne Rice, MD  oxymetazoline [facility] (2-Rh) NASAL SPRAY) 0.05 % nasal spray Place 1 spray into both nostrils 2 (two) times daily. Do not use more than 3 days 02/24/15   Julianne Rice, MD  pantoprazole (PROTONIX) 20 MG tablet Take 1 tablet (20 mg total) by mouth daily. 02/24/15   Julianne Rice, MD  pyridOXINE (VITAMIN B-6) 100 MG tablet Take 100 mg by mouth daily.    Historical Provider, MD   BP 150/81 mmHg  Pulse 80  Temp(Src) 98.2 F (36.8 C) (Oral)  Resp 19  SpO2 97% Physical Exam  Constitutional: She is oriented to person, place, and time. She appears well-developed and well-nourished.  HENT:  Head: Normocephalic and atraumatic.  Right Ear: External ear normal.  Left Ear: External ear normal.  Nose: Nose normal.  Mouth/Throat: Oropharynx is clear and moist.  Eyes:  Conjunctivae and EOM are normal. Pupils are equal, round, and reactive to light.  Neck: Normal range of motion. Neck supple. No JVD present. No tracheal deviation present. No thyromegaly present.  Cardiovascular: Normal rate, regular rhythm, normal heart sounds and intact distal pulses.   Pulmonary/Chest: Effort normal and breath sounds normal. She has no wheezes.  Abdominal: Soft. Bowel sounds are normal. She exhibits no mass. There is no tenderness. There is no guarding.  Musculoskeletal: Normal range of motion.  Lymphadenopathy:    She has no cervical adenopathy.  Neurological: She is alert and oriented to person, place, and time. She has normal reflexes. No cranial nerve deficit or sensory deficit. Gait normal. GCS eye subscore is 4. GCS verbal subscore is 5. GCS motor subscore is 6.  Reflex Scores:      Bicep reflexes are 2+ on the right side and 2+ on the left side.      Patellar reflexes are 2+ on the right side and 2+ on the left side. Strength is normal and equal throughout. Cranial nerves grossly intact. Patient fluent. No gross ataxia and patient able to ambulate without difficulty.  Skin: Skin is warm and dry.  Psychiatric: Her affect is blunt. She is withdrawn. Thought content is paranoid. She expresses no homicidal and no suicidal ideation. She expresses no suicidal plans and no homicidal plans. She is noncommunicative.  Nursing note and vitals reviewed.   ED Course  Procedures (including critical care time) Labs Review Labs Reviewed  COMPREHENSIVE METABOLIC PANEL - Abnormal; Notable for the following:    Potassium 3.1 (*)    Glucose, Bld 108 (*)    Total Protein 8.6 (*)    ALT 12 (*)    All other components within normal limits  CBC WITH DIFFERENTIAL/PLATELET - Abnormal; Notable for the following:    WBC 14.6 (*)    Hemoglobin 9.4 (*)    HCT 30.1 (*)    MCV 76.2 (*)    MCH 23.8 (*)    RDW 18.1 (*)    Neutro Abs 9.6 (*)    Lymphs Abs 4.1 (*)    All other  components within normal limits  ETHANOL  URINE RAPID DRUG SCREEN, HOSP PERFORMED  I-STAT BETA HCG BLOOD, ED ([facility], [facility], [facility] ONLY)    Imaging Review No results found. I have personally reviewed and evaluated these images and lab results as part of my medical decision-making.   EKG Interpretation   Date/Time:  Saturday May 10 2015 07:25:42 EST Ventricular Rate:  81 PR Interval:  139 QRS Duration: 85 QT Interval:  406 QTC Calculation: 471 R Axis:   65 Text Interpretation:  Normal sinus rhythm No significant change since last  tracing Confirmed by Hisham Provence MD, Andee Poles [id number]) on 05/10/2015 8:54:52 AM      MDM   Final diagnoses:  Hallucinations  Paranoia ([place])  45 year old female with known history of bipolar affective disorder presents today with auditory hallucinations. Patient had medical workup done here which is significant for mild hypokalemia. She is being orally repleted. She is also anemic with a known history of anemia. She is hemodynamically stable and does not require any transfusion or intervention at this time. She has an elevated white blood cell count 14,600. I see no source of infection etiology.  Plan transfer  patient to psych ED.   I received paperwork completed.  [name], MD 05/10/15 1059

## 2015-05-10 NOTE — ED Notes (Signed)
Pt belongings: Boots, shirt, leggings, bra, 50 cents, jacket

## 2015-05-10 NOTE — ED Notes (Signed)
Per EMS pt. From home with complaint of hypertension , latest BP 180/142mmhg by EMS, pt. Stated that she woke up at 5am today of "not feeling well.' denied hx of hypertension. Denies dizziness, SOB nor chest pain.

## 2015-05-10 NOTE — Progress Notes (Signed)
  Admission Note:   Patient arrived to [facility] via [facility] under IVC. Pt answering very few questions, then stated "I am tired of answering the same questions now, and I don't feel like talking anymore tonight. I am just going to bed now". Pt refusing further admission questions. Pt with dull flat affect on assessment. Skin assessment WNL. Pt did, however, deny SI/HI/Hallucinations at this time. No s/s of distress noted. Pt shown to her room where she immediately laid down in her bed. Report given to [place], Therapist, sports.

## 2015-05-10 NOTE — ED Notes (Signed)
Pt belongings given to [facility] staff

## 2015-05-10 NOTE — ED Notes (Signed)
Bed: WA25 Expected date:  Expected time:  Means of arrival:  Comments: 

## 2015-05-10 NOTE — ED Notes (Signed)
Bed: [facility] Expected date:  Expected time:  Means of arrival:  Comments: 25

## 2015-05-10 NOTE — BH Assessment (Signed)
Assessment Note  [name] is an 45 y.o. female. Patient is a poor historian.  Patient reports living alone but participating with [facility] ACTT team for mental health services.  This writer has left a message for the patient's ACTT crisis line to return a call for collateral information.   Patient reports she is here because some is wrong at her home although no one is there with her then she refused to talk the rest of the the interview.  Patient denies SI/HI, A/VH, and other self-injurious behaviors.  Patient reports her psychiatrist stopped her medications 2 days ago because it was causing her auditory hallucinations and now she is not feeling herself.  Patient reports poor sleep.     Pending disposition.    Diagnosis: Mood Disorder NOS  Past Medical History:  Past Medical History  Diagnosis Date  . Depression   . Bipolar 1 disorder ([place])   . Hemorrhoid   . Transfusion history   . Schizophrenia ([place])     Pt denies and reports it is schizoaffective disorder.   . Arthritis   . Bronchitis   . Schizoaffective disorder [facility])     Past Surgical History  Procedure Laterality Date  . Cesarean section      Family History:  Family History  Problem Relation Age of Onset  . Diabetes type II Other     Social History:  reports that she has been smoking Cigarettes.  She has a 8.5 pack-year smoking history. She has never used smokeless tobacco. She reports that she uses illicit drugs (Marijuana). She reports that she does not drink alcohol.  Additional Social History:  Alcohol / Drug Use Pain Medications: see chart Prescriptions: see chart Over the Counter: see chart History of alcohol / drug use?: No history of alcohol / drug abuse  CIWA: CIWA-Ar BP: 101/55 mmHg Pulse Rate: 76 COWS:    Allergies:  Allergies  Allergen Reactions  . Latuda [Lurasidone Hcl] Nausea Only    shaking  . Flagyl [Metronidazole Hcl] Nausea Only  . Sulfa Antibiotics Nausea Only    nausea    Home  Medications:  (Not in a hospital admission)  OB/GYN Status:  No LMP recorded.  General Assessment Data [facility] of [facility]: [facility] TTS Assessment: In system Is this a Tele or Face-to-Face Assessment?: Face-to-Face Is this an Initial Assessment or a Re-assessment for this encounter?: Initial Assessment Marital status: Single Maiden name: Fortuno Is patient pregnant?: No Pregnancy Status: No Living Arrangements: Alone Can pt return to current living arrangement?: Yes Admission Status: Voluntary Is patient capable of signing voluntary admission?: Yes Referral Source: Self/Family/Friend Insurance type: MCD  Medical Screening Exam ([place]) Medical Exam completed: Yes  Crisis Care Plan Living Arrangements: Alone Name of Psychiatrist: [facility] Name of Therapist: [facility]  Education Status Is patient currently in school?: No Current Grade: na Highest grade of school patient has completed: unknown  Risk to self with the past 6 months Suicidal Ideation: No-Not Currently/Within Last 6 Months Has patient been a risk to self within the past 6 months prior to admission? : No Suicidal Intent: No-Not Currently/Within Last 6 Months Has patient had any suicidal intent within the past 6 months prior to admission? : No Is patient at risk for suicide?: No Suicidal Plan?: No-Not Currently/Within Last 6 Months Has patient had any suicidal plan within the past 6 months prior to admission? : No Access to Means: No What has been your use of drugs/alcohol within the last  12 months?: denies Previous Attempts/Gestures: No How many times?: 0 Other Self Harm Risks: denies Intentional Self Injurious Behavior: None Family Suicide History: Unknown Recent stressful life event(s): Conflict (Comment) (change of medication, problems at home) Persecutory voices/beliefs?:  (unable to assess) Depression:  (unable to assess) Depression Symptoms: Despondent, Isolating, Fatigue Substance abuse  history and/or treatment for substance abuse?: No  Risk to Others within the past 6 months Homicidal Ideation: No-Not Currently/Within Last 6 Months Does patient have any lifetime risk of violence toward others beyond the six months prior to admission? : No Thoughts of Harm to Others: No-Not Currently Present/Within Last 6 Months Current Homicidal Intent: No-Not Currently/Within Last 6 Months Current Homicidal Plan: No-Not Currently/Within Last 6 Months Access to Homicidal Means: No Identified Victim: denies History of harm to others?: No Assessment of Violence: None Noted Violent Behavior Description: denies Does patient have access to weapons?: No Criminal Charges Pending?: No Does patient have a court date: No Is patient on probation?: No  Psychosis Hallucinations:  (unable to assess) Delusions: Unspecified  Mental Status Report Appearance/Hygiene: In hospital gown Eye Contact: Poor Motor Activity: Freedom of movement Speech: Slow, Tangential Level of Consciousness: Sleeping Mood: Irritable, Other (Comment) (suspicous) Affect: Other (Comment), Irritable (paranoid) Anxiety Level: None Thought Processes: Tangential Judgement: Impaired Orientation: Person, Place Obsessive Compulsive Thoughts/Behaviors: None  Cognitive Functioning Concentration: Fair Memory: Unable to Assess IQ:  (unable to assess) Insight: Poor Impulse Control: Unable to Assess Appetite:  (unable to assess) Weight Loss: 0 Weight Gain: 0 Sleep: Decreased Total Hours of Sleep: 2 Vegetative Symptoms: Unable to Assess  ADLScreening [facility] Assessment Services) Patient's cognitive ability adequate to safely complete daily activities?: Yes Patient able to express need for assistance with ADLs?: Yes Independently performs ADLs?: Yes (appropriate for developmental age)  Prior Inpatient Therapy Prior Inpatient Therapy:  (unable to assess)  Prior Outpatient Therapy Prior Outpatient Therapy: Yes Prior  Therapy Dates: current Prior Therapy Facilty/Provider(s): [facility] Reason for Treatment: unknown Does patient have an ACCT team?: Yes ([facility]) Does patient have Intensive In-House Services?  : No Does patient have [facility] services? : No Does patient have P4CC services?: No  ADL Screening (condition at time of admission) Patient's cognitive ability adequate to safely complete daily activities?: Yes Patient able to express need for assistance with ADLs?: Yes Independently performs ADLs?: Yes (appropriate for developmental age)       Abuse/Neglect Assessment (Assessment to be complete while patient is alone) Physical Abuse: Denies Verbal Abuse: Denies Sexual Abuse: Denies Exploitation of patient/patient's resources: Denies Self-Neglect: Denies Values / Beliefs Cultural Requests During Hospitalization: None Spiritual Requests During Hospitalization: None Consults Spiritual Care Consult Needed: No Social Work Consult Needed: No Regulatory affairs officer (For Healthcare) Does patient have an advance directive?: No Would patient like information on creating an advanced directive?: No - patient declined information    Additional Information 1:1 In Past 12 Months?: No CIRT Risk: No Elopement Risk: No Does patient have medical clearance?: Yes     Disposition:  Disposition Initial Assessment Completed for this Encounter: Yes Disposition of Patient: Other dispositions (Pending) Other disposition(s): Other (Comment) (Pending)  On Site Evaluation by:   Reviewed with Physician:    Chesley Noon A 05/10/2015 9:05 AM

## 2015-05-10 NOTE — ED Notes (Addendum)
MD at bedside pt continues to refuse to lift or move legs but ambulates in room when directed. Pt reports hearing negative thoughts; differ from previous assessment by this nurse. Pt continues to deny SI/HI.

## 2015-05-10 NOTE — ED Notes (Signed)
[facility] at bedside to transport pt to [facility],  IVC papers in hand.

## 2015-05-10 NOTE — Progress Notes (Signed)
Psychoeducational Group Note  Date:  05/10/2015 Time:  2046  Group Topic/Focus:  Wrap-Up Group:   The focus of this group is to help patients review their daily goal of treatment and discuss progress on daily workbooks.  Participation Level: Did Not Attend  Participation Quality:  Not Applicable  Affect:  Not Applicable  Cognitive:  Not Applicable  Insight:  Not Applicable  Engagement in Group: Not Applicable  Additional Comments:  The patient did not attend group since she was admitted after the group concluded.   Archie Balboa S 05/10/2015, 8:46 PM

## 2015-05-11 ENCOUNTER — Encounter (HOSPITAL_COMMUNITY): Payer: Self-pay | Admitting: Psychiatry

## 2015-05-11 DIAGNOSIS — F259 Schizoaffective disorder, unspecified: Secondary | ICD-10-CM

## 2015-05-11 LAB — LIPID PANEL
CHOL/HDL RATIO: 4.3 ratio
CHOLESTEROL: 151 mg/dL (ref 0–200)
HDL: 35 mg/dL — AB (ref >40)
LDL Cholesterol: 97 mg/dL (ref 0–99)
TRIGLYCERIDES: 93 mg/dL (ref <150)
VLDL: 19 mg/dL (ref 0–40)

## 2015-05-11 MED ORDER — LORAZEPAM 0.5 MG PO TABS
0.5000 mg | ORAL_TABLET | Freq: Four times a day (QID) | ORAL | Status: DC | PRN
  Administered 2015-05-13 – 2015-05-18 (×7): 0.5 mg via ORAL
  Filled 2015-05-11 (×8): qty 1

## 2015-05-11 MED ORDER — POTASSIUM CHLORIDE CRYS ER 20 MEQ PO TBCR
20.0000 meq | EXTENDED_RELEASE_TABLET | Freq: Two times a day (BID) | ORAL | Status: AC
  Administered 2015-05-11 – 2015-05-12 (×3): 20 meq via ORAL
  Filled 2015-05-11 (×5): qty 1

## 2015-05-11 MED ORDER — NICOTINE 21 MG/24HR TD PT24
21.0000 mg | MEDICATED_PATCH | Freq: Every day | TRANSDERMAL | Status: DC
  Administered 2015-05-11 – 2015-05-14 (×4): 21 mg via TRANSDERMAL
  Filled 2015-05-11 (×7): qty 1

## 2015-05-11 NOTE — Progress Notes (Signed)
Patient ID: [name], female   DOB: [birth year], 45 y.o.   MRN: [id number] D: Patient sleeping most of the evening. When awoken pt reports doing well and needed to sleep. Pt denies SI/HI/AVH and pain. Pt denies any needs or concerns. Cooperative with assessment.    A: Met with pt 1:1. Writer encouraged pt to discuss feelings and come to staff with any need or concern.   R: Patient is safe on the unit.

## 2015-05-11 NOTE — Progress Notes (Signed)
Psychoeducational Group Note  Date:  05/11/2015 Time:  2049  Group Topic/Focus:  Wrap-Up Group:   The focus of this group is to help patients review their daily goal of treatment and discuss progress on daily workbooks.  Participation Level: Did Not Attend  Participation Quality:  Not Applicable  Affect:  Not Applicable  Cognitive:  Not Applicable  Insight:  Not Applicable  Engagement in Group: Not Applicable  Additional Comments:  The patient did not attend group this evening.   Archie Balboa S 05/11/2015, 8:48 PM

## 2015-05-11 NOTE — Plan of Care (Signed)
Problem: Diagnosis: Increased Risk For Suicide Attempt Goal: STG-Patient Will Attend All Groups On The Unit Outcome: Not Progressing Pt did not attend evening wrap up group.

## 2015-05-11 NOTE — BHH Group Notes (Signed)
[place] Group Notes:  (Nursing/MHT/Case Management/Adjunct)  Date:  05/11/2015  Time:  4:10 PM  Type of Therapy:  Psychoeducational Skills  Participation Level:  Active  Participation Quality:  Appropriate  Affect:  Appropriate  Cognitive:  Appropriate  Insight:  Appropriate  Engagement in Group:  Engaged  Modes of Intervention:  Problem-solving  Summary of Progress/Problems: Summary of Progress/Problems: Topic was on leisure and lifestyle changes. Discussed the important of choosing healthy leisure activities. Group encouraged to surround themselves with positive and healthy group/support system when changing to a healthy life style.   [name] 05/11/2015, 4:10 PM

## 2015-05-11 NOTE — BHH Counselor (Signed)
Patient seen at [place] in effort to complete PSA; unwilling to participate in conversation initiated by CSW.  Patient seen again in PSA effort shortly after 2 PM; unable to get patient to respond to prompting as she was either in deep sleep or did not wish to engage.  Sheilah Pigeon, LCSW

## 2015-05-11 NOTE — BHH Group Notes (Signed)
[place] Group Notes: (Clinical Social Work)   05/11/2015      Type of Therapy:  Group Therapy   Participation Level:  Did Not Attend despite MHT prompting   Sedona Matson, LCSW 05/11/2015, 12:59 PM

## 2015-05-11 NOTE — H&P (Signed)
Psychiatric Admission Assessment Adult  Patient Identification: [name] MRN:  [id number] Date of Evaluation:  05/11/2015 Chief Complaint:  Mood Disorder Principal Diagnosis: Schizoaffective disorder ([place]) Diagnosis:   Patient Active Problem List   Diagnosis Date Noted  . Schizoaffective disorder ([place]) [F25.9] 05/10/2015    Priority: High  . Hallucinations [R44.3]   . Delusions ([place]) [F22] 02/15/2014  . Anxiety [F41.9] 01/27/2013  . Hypertension [I10] 02/24/2011  . ANEMIA-IRON DEFICIENCY [D50.9] 12/13/2006  . GERD [K21.9] 12/13/2006  . OBESITY [E66.9] 12/12/2006  . Schizoaffective disorder, bipolar type ([place]) [F25.0] 12/12/2006  . HEMORRHOIDS, NOS [K64.9] 12/12/2006  . PAIN-NECK [M54.2] 12/12/2006  . TREMOR [R25.9] 12/12/2006   History of Present Illness:  [name], a 45 year old woman, vague reports of what brought her in to hospital.  She was seen today.  She remains guarded about history.  She states that "something happened at house, they know about it, I just want to sleep." Per previous note: [name] is an 45 y.o. female. Patient is a poor historian. Patient reports living alone but participating with [facility] team for mental health services. This writer has left a message for the patient's ACTT crisis line to return a call for collateral information. Patient reports she is here because some is wrong at her home although no one is there with her then she refused to talk the rest of the the interview. Patient denies SI/HI, A/VH, and other self-injurious behaviors. Patient reports her psychiatrist stopped her medications 2 days ago because it was causing her auditory hallucinations and now she is not feeling herself. Patient reports poor sleep.    Associated Signs/Symptoms: Depression Symptoms:  depressed mood, anxiety, (Hypo) Manic Symptoms:  Irritable Mood, Labiality of Mood, Anxiety Symptoms:  Excessive Worry, Psychotic Symptoms:   Paranoia, PTSD Symptoms: NA Total Time spent with patient: 45 minutes  Past Psychiatric History: see above noted  Risk to Self: Is patient at risk for suicide?: No Risk to Others:   Prior Inpatient Therapy:   Prior Outpatient Therapy:    Alcohol Screening:   Substance Abuse History in the last 12 months:  Yes.   Consequences of Substance Abuse: NA Previous Psychotropic Medications: Yes  Psychological Evaluations: Yes  Past Medical History:  Past Medical History  Diagnosis Date  . Depression   . Bipolar 1 disorder ([place])   . Hemorrhoid   . Transfusion history   . Schizophrenia ([place])     Pt denies and reports it is schizoaffective disorder.   . Arthritis   . Bronchitis   . Schizoaffective disorder [facility])     Past Surgical History  Procedure Laterality Date  . Cesarean section     Family History:  Family History  Problem Relation Age of Onset  . Diabetes type II Other    Family Psychiatric  History: Denies Social History:  History  Alcohol Use No     History  Drug Use  . Yes  . Special: Marijuana    Comment: Last used: yesterday.     Social History   Social History  . Marital Status: Legally Separated    Spouse Name: N/A  . Number of Children: N/A  . Years of Education: N/A   Social History Main Topics  . Smoking status: Current Every Day Smoker -- 0.50 packs/day for 17 years    Types: Cigarettes  . Smokeless tobacco: Never Used  . Alcohol Use: No  . Drug Use: Yes    Special: Marijuana  Comment: Last used: yesterday.   Marland Kitchen Sexual Activity: Not Asked   Other Topics Concern  . None   Social History Narrative   ** Merged History Encounter **       Additional Social History:    Pain Medications:  (pt refusing to answer)                    Allergies:   Allergies  Allergen Reactions  . Latuda [Lurasidone Hcl] Nausea Only    shaking  . Flagyl [Metronidazole Hcl] Nausea Only  . Sulfa Antibiotics Nausea Only    nausea   Lab Results:   Results for orders placed or performed during the hospital encounter of 05/10/15 (from the past 48 hour(s))  Lipid panel, fasting     Status: Abnormal   Collection Time: 05/11/15  6:30 AM  Result Value Ref Range   Cholesterol 151 0 - 200 mg/dL   Triglycerides 93 <150 mg/dL   HDL 35 (L) >40 mg/dL   Total CHOL/HDL Ratio 4.3 RATIO   VLDL 19 0 - 40 mg/dL   LDL Cholesterol 97 0 - 99 mg/dL    Comment:        Total Cholesterol/HDL:CHD Risk Coronary Heart Disease Risk Table                     Men   Women  1/2 Average Risk   3.4   3.3  Average Risk       5.0   4.4  2 X Average Risk   9.6   7.1  3 X Average Risk  23.4   11.0        Use the calculated Patient Ratio above and the CHD Risk Table to determine the patient's CHD Risk.        ATP III CLASSIFICATION (LDL):  <100     mg/dL   Optimal  100-129  mg/dL   Near or Above                    Optimal  130-159  mg/dL   Borderline  160-189  mg/dL   High  >190     mg/dL   Very High Performed at [facility]     Metabolic Disorder Labs:  No results found for: HGBA1C, MPG No results found for: PROLACTIN Lab Results  Component Value Date   CHOL 151 05/11/2015   TRIG 93 05/11/2015   HDL 35* 05/11/2015   CHOLHDL 4.3 05/11/2015   VLDL 19 05/11/2015   LDLCALC 97 05/11/2015    Current Medications: Current Facility-Administered Medications  Medication Dose Route Frequency Provider Last Rate Last Dose  . acetaminophen (TYLENOL) tablet 650 mg  650 mg Oral Q6H PRN Lurena Nida, NP      . perphenazine (TRILAFON) tablet 8 mg  8 mg Oral BID Lurena Nida, NP   8 mg at 05/11/15 0944  . pyridOXINE (VITAMIN B-6) tablet 100 mg  100 mg Oral Daily Lurena Nida, NP   100 mg at 05/11/15 4944   PTA Medications: Prescriptions prior to admission  Medication Sig Dispense Refill Last Dose  . albuterol (PROVENTIL HFA;VENTOLIN HFA) 108 (90 BASE) MCG/ACT inhaler Inhale 2 puffs into the lungs every 6 (six) hours as needed for wheezing or  shortness of breath.   Past Month at Unknown time  . azithromycin (ZITHROMAX) 250 MG tablet Take 1 tablet (250 mg total) by mouth daily. Take first 2 tablets together,  then 1 every day until finished. 6 tablet 0   . chlorproMAZINE (THORAZINE) 100 MG tablet Take 100 mg by mouth 2 (two) times daily.   02/23/2015 at 1700  . ferrous sulfate 325 (65 FE) MG EC tablet Take 325 mg by mouth daily with breakfast.   02/23/2015 at Unknown time  . Multiple Vitamin (MULTIVITAMIN WITH MINERALS) TABS tablet Take 1 tablet by mouth daily.   02/23/2015 at Unknown time  . perphenazine (TRILAFON) 8 MG tablet Take 8 mg by mouth 2 (two) times daily.     Marland Kitchen pyridOXINE (VITAMIN B-6) 100 MG tablet Take 100 mg by mouth daily.   02/23/2015 at Unknown time    Musculoskeletal: Strength & Muscle Tone: within normal limits Gait & Station: normal Patient leans: N/A  Psychiatric Specialty Exam: Physical Exam  Vitals reviewed.   Review of Systems  All other systems reviewed and are negative.   Blood pressure 111/79, pulse 100, temperature 98.2 F (36.8 C), temperature source Oral, resp. rate 20, height _0  (1.626 m), weight 108.863 kg (240 lb).Body mass index is 41.18 kg/(m^2).  General Appearance: Neat  Eye Contact::  Good  Speech:  Clear and Coherent  Volume:  Normal  Mood:  Anxious  Affect:  Constricted  Thought Process:  Irrelevant and Loose  Orientation:  Full (Time, Place, and Person)  Thought Content:  Paranoid Ideation  Suicidal Thoughts:  No  Homicidal Thoughts:  No  Memory:  Immediate;   Fair Recent;   Fair Remote;   Fair  Judgement:  Impaired  Insight:  Lacking  Psychomotor Activity:  Normal  Concentration:  Fair  Recall:  Poor  Fund of Knowledge:Poor  Language: Fair  Akathisia:  Negative  Handed:  Right  AIMS (if indicated):     Assets:  Resilience  ADL's:  Intact  Cognition: WNL  Sleep:  Number of Hours: 4   Treatment Plan Summary:   Admit for crisis management and mood stabilization and  crisis management Medication management to re-stabilize current mood symptoms - perphenazine (TRILAFON) tablet 8 mg daily for mood stabilization and psychosis Group counseling sessions for coping skills Medical consults as needed Review and reinstate any pertinent home medications for other health problems  Observation Level/Precautions:  15 minute checks  Laboratory:  per ED  Psychotherapy:  group  Medications:  As per medlist  Consultations:  As needed  Discharge Concerns:  safety  Estimated LOS:  5-7 days  Other:     I certify that inpatient services furnished can reasonably be expected to improve the patient's condition.   Freda Munro May Agustin AGNP-BC 1/22/20174:11 PM I have discussed case with NP and have met with patient Agree with NP note and assessment  Patient is a 45 year old female. She has been diagnosed with Schizoaffective Disorder in the past, and is an ACT team client. Presented to ED initially complaining of HTN, but also reported feeling there were " things on her" and reported auditory hallucinations demeaning and criticizing her . Reportedly had stopped her psychiatric medications several days prior ( Trilafon , as per chart, patient does not remember name). At this time patient vague historian, states she is here because of having a problem with her neighbor due to noise levels, which caused her to be unable to relax or sleep well. Denies SI, and although denies any plan or intention of violence, reported she would like to " choke her".  Denies drug or alcohol abuse, denies medical illnesses . Of note, patient anemic,  but Hgb, HCT improved compared to prior CBC. Lipid panel unremarkable. HgbA1C ordered, pending at this time. Dx- Schizoaffective Disorder Plan - inpatient admission- restarted on trilafon.

## 2015-05-11 NOTE — BHH Suicide Risk Assessment (Addendum)
[facility] Admission Suicide Risk Assessment   Nursing information obtained from:  Patient Demographic factors:  Unemployed, Living alone Current Mental Status:  NA Loss Factors:   (refuses to answer) Historical Factors:   (refuses to answer) Risk Reduction Factors:   (unable to assess)  Total Time spent with patient: 45 minutes Principal Problem: Schizoaffective disorder ([place]) Diagnosis:   Patient Active Problem List   Diagnosis Date Noted  . Schizoaffective disorder ([place]) [F25.9] 05/10/2015  . Hallucinations [R44.3]   . Delusions ([place]) [F22] 02/15/2014  . Anxiety [F41.9] 01/27/2013  . Hypertension [I10] 02/24/2011  . ANEMIA-IRON DEFICIENCY [D50.9] 12/13/2006  . GERD [K21.9] 12/13/2006  . OBESITY [E66.9] 12/12/2006  . Schizoaffective disorder, bipolar type ([place]) [F25.0] 12/12/2006  . HEMORRHOIDS, NOS [K64.9] 12/12/2006  . PAIN-NECK [M54.2] 12/12/2006  . TREMOR [R25.9] 12/12/2006     Continued Clinical Symptoms:  Alcohol Use Disorder Identification Test Final Score (AUDIT): 0 The "Alcohol Use Disorders Identification Test", Guidelines for Use in Primary Care, Second Edition.  World Pharmacologist [facility]). Score between 0-7:  no or low risk or alcohol related problems. Score between 8-15:  moderate risk of alcohol related problems. Score between 16-19:  high risk of alcohol related problems. Score 20 or above:  warrants further diagnostic evaluation for alcohol dependence and treatment.   CLINICAL FACTORS:  Patient is a 45 year old female. She has been diagnosed with Schizoaffective Disorder in the past, and is an ACT team client. Presented to ED initially complaining of HTN, but also reported feeling there were " things on her" and reported auditory hallucinations demeaning and criticizing her . Reportedly had stopped her psychiatric medications several days prior  ( Trilafon , as per chart, patient does not remember name). At this time patient vague historian, states she is here  because of having a problem with her neighbor due to noise levels, which caused her to be unable to relax or sleep well. Denies SI, and although denies any plan or intention of violence, reported she would like to " choke her".  Denies drug or alcohol abuse, denies medical illnesses . Of note, patient anemic, but Hgb, HCT improved compared to prior CBC. Lipid panel unremarkable. HgbA1C ordered, pending at this time. Dx- Schizoaffective Disorder Plan - inpatient admission- restarted on trilafon.     Musculoskeletal: Strength & Muscle Tone: within normal limits Gait & Station: normal Patient leans: N/A  Psychiatric Specialty Exam: ROS  Blood pressure 111/79, pulse 100, temperature 98.2 F (36.8 C), temperature source Oral, resp. rate 20, height 5\' 4"  (1.626 m), weight 240 lb (108.863 kg).Body mass index is 41.18 kg/(m^2).  General Appearance: Fairly Groomed  Engineer, water::  Fair  Speech:  Normal Rate  Volume:  Normal  Mood:  reports depression  Affect:  Restricted and vaguely irritable   Thought Process:  generally linear but does become disorganized at times   Orientation:  Other:  fully alert and attentive  Thought Content:  Rumination and at this time denies hallucinations  Suicidal Thoughts:  No  Homicidal Thoughts:  describes thougths of choking neighbor, but denies plan or intention of violence   Memory:  recent and remote fair   Judgement:  Fair  Insight:  Fair  Psychomotor Activity:  Normal  Concentration:  Good  Recall:  Good  Fund of Knowledge:Good  Language: Good  Akathisia:  Negative  Handed:  Right  AIMS (if indicated):     Assets:  Desire for Improvement Resilience  Sleep:  Number of Hours: 4  Cognition: WNL  ADL's:   Fair     COGNITIVE FEATURES THAT CONTRIBUTE TO RISK:  Closed-mindedness and Loss of executive function    SUICIDE RISK:   Moderate:  Frequent suicidal ideation with limited intensity, and duration, some specificity in terms of plans, no  associated intent, good self-control, limited dysphoria/symptomatology, some risk factors present, and identifiable protective factors, including available and accessible social support.  PLAN OF CARE: Patient will be admitted to inpatient psychiatric unit for stabilization and safety. Will provide and encourage milieu participation. Provide medication management and maked adjustments as needed.  Will follow daily.    I certify that inpatient services furnished can reasonably be expected to improve the patient's condition.   Neita Garnet, MD 05/11/2015, 5:02 PM

## 2015-05-11 NOTE — Progress Notes (Signed)
DAR NOTE: Pt present with flat affect and depressed mood in the unit. Pt has been isolating himself and has been bed most of the time. Pt denies physical pain, took all her meds as scheduled. As per self inventory, pt had a good night sleep, good appetite, normal energy, and good concentration. Pt rate depression at 0, hopeless ness at 0, and anxiety at 0. Pt's safety ensured with 15 minute and environmental checks. Pt currently denies SI/HI and A/V hallucinations. Pt verbally agrees to seek staff if SI/HI or A/VH occurs and to consult with staff before acting on these thoughts. Will continue POC.

## 2015-05-12 DIAGNOSIS — F1220 Cannabis dependence, uncomplicated: Secondary | ICD-10-CM | POA: Clinically undetermined

## 2015-05-12 DIAGNOSIS — F17200 Nicotine dependence, unspecified, uncomplicated: Secondary | ICD-10-CM | POA: Clinically undetermined

## 2015-05-12 DIAGNOSIS — F250 Schizoaffective disorder, bipolar type: Principal | ICD-10-CM

## 2015-05-12 LAB — HEMOGLOBIN A1C
HEMOGLOBIN A1C: 6.1 % — AB (ref 4.8–5.6)
MEAN PLASMA GLUCOSE: 128 mg/dL

## 2015-05-12 MED ORDER — OLANZAPINE 5 MG PO TABS
5.0000 mg | ORAL_TABLET | Freq: Four times a day (QID) | ORAL | Status: DC | PRN
  Filled 2015-05-12: qty 1

## 2015-05-12 NOTE — BHH Group Notes (Signed)
[facility] Group Notes:  (Counselor/Nursing/MHT/Case Management/Adjunct)  05/12/2015 1:15PM  Type of Therapy:  Group Therapy  Participation Level:  Active  Participation Quality:  Appropriate  Affect:  Flat  Cognitive:  Oriented  Insight:  Improving  Engagement in Group:  Limited  Engagement in Therapy:  Limited  Modes of Intervention:  Discussion, Exploration and Socialization  Summary of Progress/Problems: The topic for group was balance in life.  Pt participated in the discussion about when their life was in balance and out of balance and how this feels.  Pt discussed ways to get back in balance and short term goals they can work on to get where they want to be.  [name] was in and out of group several times, but engaged while she was present.  Stated she is balanced, and knows this because her thinking process is better than when she came in.  Proceeded to blame her problems on whomever she is living with.  "I'm a private person, and they were not allowing me my space.  They are unstable themselves, even though they say I'm the unstable one."   Roque Lias B 05/12/2015 2:44 PM

## 2015-05-12 NOTE — Progress Notes (Signed)
Patient stayed back from dinner.  Observed sitting in day room talking to self.

## 2015-05-12 NOTE — BHH Group Notes (Signed)
[facility] LCSW Aftercare Discharge Planning Group Note   05/12/2015 12:55 PM  Participation Quality:  Minimal  Mood/Affect:  Flat  Depression Rating:    Anxiety Rating:    Thoughts of Suicide:  No Will you contract for safety?   NA  Current AVH:  Denies  Plan for Discharge/Comments:  "I'm just here going through the course."  Unable to say who's idea it was for her to come in or why she is here.  Appears confused, disorganized.  Able to tell me she works with an ACT team-"I don't know their name, but it used to be [facility].  He left you know."  Transportation Means:   Supports:  [name], [name]

## 2015-05-12 NOTE — Tx Team (Signed)
Interdisciplinary Treatment Plan Update (Adult)  Date:  05/12/2015  Time Reviewed:  11:28 AM   Progress in Treatment: Attending groups: No. Participating in groups:  No. Taking medication as prescribed:  Yes. Tolerating medication:  Yes. Family/Significant othe contact made:  Message left for pt's ACT team ([facility]) for collateral information.  Patient understands diagnosis:  No. Pt is poor historian, lacks insight. Refusing to complete psychosocial information or discuss presenting problems in detail.  Discussing patient identified problems/goals with staff:  Yes. Medical problems stabilized or resolved:  Yes. Denies suicidal/homicidal ideation: Yes. Issues/concerns per patient self-inventory:  Other:  Discharge Plan or Barriers: CSW assessing. Pt states that she lives alone and is connected to [facility]. CSW left message for ACTT in attempt to gain collateral information regarding pt circumstance.   Reason for Continuation of Hospitalization: Delusions  Hallucinations Medication stabilization  Comments:  [name] is an 45 y.o. female. Patient is a poor historian. Patient reports living alone but participating with [facility] team for mental health services. Message left for [facility] by both CSW and intake assessor requesting a call back for collateral information. Patient reports she is here because some is wrong at her home although no one is there but refused to elaborate during initial assessment. Patient denies SI/HI, VH, and other self-injurious behaviors. Patient reports her psychiatrist stopped her medications 2 days ago because it was causing her auditory hallucinations and now she is not feeling herself. Patient reports poor sleep.   Estimated length of stay:  3-7 days   New goal(s): to gain collateral information and confirm appropriate d/c plan with [facility] and patient.   Additional Comments:  Patient and CSW reviewed pt's identified goals and treatment plan.  Patient verbalized understanding and agreed to treatment plan. CSW reviewed [facility] "Discharge Process and Patient Involvement" Form. Pt verbalized understanding of information provided and signed form.    Review of initial/current patient goals per problem list:  1. Goal(s): Patient will participate in aftercare plan  Met: No.   Target date: at discharge  As evidenced by: Patient will participate within aftercare plan AEB aftercare provider and housing plan at discharge being identified.  1/23: Pt continues to refuse to complete PSA. Message left for [facility] requesting call back for collateral information and to confirm d/c plan.   2. Goal (s): Patient will exhibit decreased depressive symptoms and suicidal ideations.  Met: No.   Target date: at discharge  As evidenced by: Patient will utilize self rating of depression at 3 or below and demonstrate decreased signs of depression or be deemed stable for discharge by MD.  1/23:  Pt presents with depressed/agitated mood and flat affect. She continues to refuse to speak/answer questions.   3. Goal(s): Patient will demonstrate decreased signs and symptoms of psychosis.   Met:No.   Target date: at discharge  As evidenced by: Patient will utilize self rating of anxiety at 3 or below and demonstrated decreased signs of anxiety, or be deemed stable for discharge by MD  1/23: Pt denies AVH but continues to refuse to participate in assessment/possibly delusional and paranoid.   4. Goal(s): Patient will demonstrate decreased signs of withdrawal due to substance abuse  Met:Yes  Target date:at discharge   As evidenced by: Patient will produce a CIWA/COWS score of 0, have stable vitals signs, and no symptoms of withdrawal.  1/23: Pt positive for THC only. No withdrawal protocol and pt has stable vitals.   Attendees: Patient:   05/12/2015  11:28 AM   Family:   05/12/2015 11:28 AM   Physician:  Dr. Carlton Adam, MD 05/12/2015 11:28  AM   Nursing:   Jamal Collin RN 05/12/2015 11:28 AM   Clinical Social Worker: Maxie Better, LCSW 05/12/2015 11:28 AM   Clinical Social Worker: Roque Lias, LCSW  05/12/2015 11:28 AM   Other:  Gerline Legacy Nurse Case Manager 05/12/2015 11:28 AM   Other:   05/12/2015 11:28 AM   Other:   05/12/2015 11:28 AM   Other:  05/12/2015 11:28 AM   Other:  05/12/2015 11:28 AM   Other:  05/12/2015 11:28 AM    05/12/2015 11:28 AM    05/12/2015 11:28 AM    05/12/2015 11:28 AM    05/12/2015 11:28 AM    Scribe for Treatment Team:   Maxie Better, LCSW 05/12/2015 11:28 AM

## 2015-05-12 NOTE — Progress Notes (Signed)
[facility] MD Progress Note  05/12/2015 3:19 PM [name]  MRN:  [id number] Subjective:  Patient states " I have already told about myself to another doctor, I do not want to repeat myself.'  Objective;[name] is a 40 y old AAF ,who has a hx of Schizoaffective do, cannabis abuse, tobacco abuse as well as HTN , who presented to [facility] brought in by EMS , and later on was admitted to [facility] for psychosis/mood lability.  Patient seen and chart reviewed.Discussed patient with treatment team.  I have reviewed previous notes in EHR , H&P per May NP. Pt today seen as irritable , guarded , paranoid - reports she does not want to talk about what led to her hospitalization again. Pt reports she is fine with her current medications and does not want any changes. Per staff - pt is compliant on medications - has mood lability on and off , was seen as laughing to self continuously in the bathroom ,- no disruptive issues noted on the unit .    Principal Problem: Schizoaffective disorder, bipolar type ([place]) Diagnosis:   Patient Active Problem List   Diagnosis Date Noted  . Tobacco use disorder [F17.200] 05/12/2015  . Cannabis use disorder, moderate, dependence ([place]) [F12.20] 05/12/2015  . Hypertension [I10] 02/24/2011  . ANEMIA-IRON DEFICIENCY [D50.9] 12/13/2006  . GERD [K21.9] 12/13/2006  . OBESITY [E66.9] 12/12/2006  . Schizoaffective disorder, bipolar type ([place]) [F25.0] 12/12/2006  . HEMORRHOIDS, NOS [K64.9] 12/12/2006  . PAIN-NECK [M54.2] 12/12/2006  . TREMOR [R25.9] 12/12/2006   Total Time spent with patient: 30 minutes  Past Psychiatric History: as per H&P  Past Medical History:  Past Medical History  Diagnosis Date  . Depression   . Bipolar 1 disorder ([place])   . Hemorrhoid   . Transfusion history   . Schizophrenia ([place])     Pt denies and reports it is schizoaffective disorder.   . Arthritis   . Bronchitis   . Schizoaffective disorder [facility])     Past Surgical History  Procedure  Laterality Date  . Cesarean section     Family History:  Family History  Problem Relation Age of Onset  . Diabetes type II Other    Family Psychiatric  History: as per H&P Social History:  History  Alcohol Use No     History  Drug Use  . Yes  . Special: Marijuana    Comment: Last used: yesterday.     Social History   Social History  . Marital Status: Legally Separated    Spouse Name: N/A  . Number of Children: N/A  . Years of Education: N/A   Social History Main Topics  . Smoking status: Current Every Day Smoker -- 0.50 packs/day for 17 years    Types: Cigarettes  . Smokeless tobacco: Never Used  . Alcohol Use: No  . Drug Use: Yes    Special: Marijuana     Comment: Last used: yesterday.   Marland Kitchen Sexual Activity: Not Asked   Other Topics Concern  . None   Social History Narrative   ** Merged History Encounter **       Additional Social History:    Pain Medications:  (pt refusing to answer)                    Sleep: Fair  Appetite:  Fair  Current Medications: Current Facility-Administered Medications  Medication Dose Route Frequency Provider Last Rate Last Dose  . acetaminophen (TYLENOL) tablet 650 mg  650 mg  Oral Q6H PRN Lurena Nida, NP      . LORazepam (ATIVAN) tablet 0.5 mg  0.5 mg Oral Q6H PRN Jenne Campus, MD      . nicotine (NICODERM CQ - dosed in mg/24 hours) patch 21 mg  21 mg Transdermal Daily Kerrie Buffalo, NP   21 mg at 05/12/15 0820  . OLANZapine (ZYPREXA) tablet 5 mg  5 mg Oral Q6H PRN Ursula Alert, MD      . perphenazine (TRILAFON) tablet 8 mg  8 mg Oral BID Lurena Nida, NP   8 mg at 05/12/15 0818  . potassium chloride SA (K-DUR,KLOR-CON) CR tablet 20 mEq  20 mEq Oral BID Jenne Campus, MD   20 mEq at 05/12/15 0818  . pyridOXINE (VITAMIN B-6) tablet 100 mg  100 mg Oral Daily Lurena Nida, NP   100 mg at 05/12/15 [id number]    Lab Results:  Results for orders placed or performed during the hospital encounter of 05/10/15 (from  the past 48 hour(s))  Hemoglobin A1c     Status: Abnormal   Collection Time: 05/11/15  6:30 AM  Result Value Ref Range   Hgb A1c MFr Bld 6.1 (H) 4.8 - 5.6 %    Comment: (NOTE)         Pre-diabetes: 5.7 - 6.4         Diabetes: >6.4         Glycemic control for adults with diabetes: <7.0    Mean Plasma Glucose 128 mg/dL    Comment: (NOTE) Performed At: [facility] [place], [place] [id number] Lindon Romp MD [id number] Performed at [facility]   Lipid panel, fasting     Status: Abnormal   Collection Time: 05/11/15  6:30 AM  Result Value Ref Range   Cholesterol 151 0 - 200 mg/dL   Triglycerides 93 <150 mg/dL   HDL 35 (L) >40 mg/dL   Total CHOL/HDL Ratio 4.3 RATIO   VLDL 19 0 - 40 mg/dL   LDL Cholesterol 97 0 - 99 mg/dL    Comment:        Total Cholesterol/HDL:CHD Risk Coronary Heart Disease Risk Table                     Men   Women  1/2 Average Risk   3.4   3.3  Average Risk       5.0   4.4  2 X Average Risk   9.6   7.1  3 X Average Risk  23.4   11.0        Use the calculated Patient Ratio above and the CHD Risk Table to determine the patient's CHD Risk.        ATP III CLASSIFICATION (LDL):  <100     mg/dL   Optimal  100-129  mg/dL   Near or Above                    Optimal  130-159  mg/dL   Borderline  160-189  mg/dL   High  >190     mg/dL   Very High Performed at [facility]     Physical Findings: AIMS: Facial and Oral Movements Muscles of Facial Expression: None, normal Lips and Perioral Area: None, normal Jaw: None, normal Tongue: None, normal,Extremity Movements Upper (arms, wrists, hands, fingers): None, normal Lower (legs, knees, ankles, toes): None, normal, Trunk Movements Neck, shoulders, hips: None, normal, Overall  Severity Severity of abnormal movements (highest score from questions above): None, normal Incapacitation due to abnormal movements: None, normal Patient's awareness of abnormal  movements (rate only patient's report): No Awareness, Dental Status Current problems with teeth and/or dentures?: No Does patient usually wear dentures?: No  CIWA:  CIWA-Ar Total: 0 COWS:  COWS Total Score: 0  Musculoskeletal: Strength & Muscle Tone: within normal limits Gait & Station: normal Patient leans: N/A  Psychiatric Specialty Exam: Review of Systems  Psychiatric/Behavioral: Positive for depression and substance abuse. The patient is nervous/anxious.   All other systems reviewed and are negative.   Blood pressure 105/76, pulse 84, temperature 98.4 F (36.9 C), temperature source Oral, resp. rate 16, height 5\' 4"  (1.626 m), weight 108.863 kg (240 lb).Body mass index is 41.18 kg/(m^2).  General Appearance: Disheveled  Eye Sport and exercise psychologist::  Fair  Speech:  Slow  Volume:  Decreased  Mood:  Anxious, Dysphoric and Irritable  Affect:  Inappropriate and Labile  Thought Process:  Disorganized  Orientation:  Full (Time, Place, and Person)  Thought Content:  Paranoid Ideation, Rumination and seen as laughing out loud to self while alone in the bathroom  Suicidal Thoughts:  No  Homicidal Thoughts:  No  Memory:  Immediate;   Fair Recent;   Poor Remote;   Poor  Judgement:  Impaired  Insight:  Shallow  Psychomotor Activity:  Restlessness  Concentration:  Poor  Recall:  [place] of Knowledge:Fair  Language: Fair  Akathisia:  No  Handed:  Right  AIMS (if indicated):     Assets:  Others:  access to healthcare  ADL's:  Intact  Cognition: WNL  Sleep:  Number of Hours: 6.75   Treatment Plan Summary:Pt is a 63 y old AAF , who has a hx of schizoaffective do, presented with psychosis. Pt will need continued treatment and stabilization on the inpatient unit. Daily contact with patient to assess and evaluate symptoms and progress in treatment and Medication management   Reviewed past medical records,treatment plan. Reviewed notes in EHR from May NP. Will continue Trilafon 8 mg po bid for  psychosis. Pt at this time does not want any medication changes. Will make available PRN medications as per agitation protocol. Will continue to monitor vitals ,medication compliance and treatment side effects while patient is here.  Will monitor for medical issues as well as call consult as needed.  Offer nicotine patch for smoking cessation. Reviewed labs ,will order TSH, PL - if not already done. Hba1c- slightly elevated - will recommend diet changes.Will also repeat BMP since pt presented with Hypokalemia and is on KDUR. CSW will start working on disposition.Will need to expand collateral information.  Patient to participate in therapeutic milieu .      Alphonso Gregson MD 05/12/2015, 3:19 PM

## 2015-05-12 NOTE — Clinical Social Work Note (Signed)
[name] [phone]) from [facility] called, patient current w their services, requests update.  Edwyna Shell, LCSW Lead Clinical Social Worker Phone:  [phone]

## 2015-05-12 NOTE — Progress Notes (Signed)
CSW spoke with [name] ([facility]) [phone] and plans to continue working with pt after discharge. She is requsting an update on Wednesday regarding pt's progress and tentative d/c date if known.  Maxie Better, MSW, LCSW Clinical Social Worker 05/12/2015 3:56 PM

## 2015-05-12 NOTE — Progress Notes (Addendum)
D: Patient has been pleasant upon approach.  She is not forthcoming with information as to why she is here.  Patient was observed in bathroom laughing uncontrollably at nothing.  She has been attending groups.  She has been compliant with her medications.  She denies SI/HI.  She denies AVH, however, appears to be responding to internal stimuli.  Patient did not fill out her self inventory, except to rate her anxiety as a 3. A: Continue to monitor medication management and MD orders.  Safety checks completed every 15 minutes per protocol.  Offer support and encouragement as needed. R: Patient is receptive to staff; her behavior is appropriate.
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
## 2015-05-12 NOTE — Progress Notes (Signed)
Patient ID: [name], female   DOB: 03-31-1971, 45 y.o.   MRN: [id number] D: Patient pacing up and down hall appeared to be responding to internal stimuli. Pt observed talking and laughing to self. Pt offered PRN medication for agitation and anxiety but refused. Pt reports she don't need medication and asked writer to leave room. Pt denies SI/HI and pain.   A: Met with pt 1:1. Encouraged pt to take evening medication that was refused. Writer encouraged pt to discuss feelings and come to staff with any need or concern.   R: Patient is safe on the unit. Will continue to monitor

## 2015-05-12 NOTE — Progress Notes (Signed)
Pt did not attend wrap-up group   

## 2015-05-12 NOTE — Progress Notes (Signed)
Patient refused 1700 medications.

## 2015-05-13 MED ORDER — OLANZAPINE 10 MG PO TBDP
10.0000 mg | ORAL_TABLET | Freq: Once | ORAL | Status: AC
  Administered 2015-05-13: 10 mg via ORAL
  Filled 2015-05-13 (×2): qty 1

## 2015-05-13 MED ORDER — AMANTADINE HCL 100 MG PO CAPS
100.0000 mg | ORAL_CAPSULE | Freq: Two times a day (BID) | ORAL | Status: DC
  Administered 2015-05-14 – 2015-05-20 (×12): 100 mg via ORAL
  Filled 2015-05-13 (×19): qty 1

## 2015-05-13 MED ORDER — OLANZAPINE 10 MG IM SOLR
10.0000 mg | Freq: Four times a day (QID) | INTRAMUSCULAR | Status: DC | PRN
  Filled 2015-05-13: qty 10

## 2015-05-13 MED ORDER — OLANZAPINE 10 MG PO TABS
10.0000 mg | ORAL_TABLET | Freq: Four times a day (QID) | ORAL | Status: DC | PRN
Start: 2015-05-13 — End: 2015-05-20

## 2015-05-13 MED ORDER — TRAZODONE HCL 50 MG PO TABS
50.0000 mg | ORAL_TABLET | Freq: Every evening | ORAL | Status: DC | PRN
  Administered 2015-05-13: 50 mg via ORAL
  Filled 2015-05-13: qty 1

## 2015-05-13 NOTE — BHH Group Notes (Signed)
[place] LCSW Group Therapy  05/13/2015 2:31 PM   Type of Therapy:  Group Therapy  Participation Level:  Active  Participation Quality:  Attentive  Affect:  Appropriate  Cognitive:  Appropriate  Insight:  Improving  Engagement in Therapy:  Engaged  Modes of Intervention:  Clarification, Education, Exploration and Socialization  Summary of Progress/Problems: Today's group focused on relapse prevention.  We defined the term, and then brainstormed on ways to prevent relapse. In and out of group multiple times.  Never stayed long enough to be engaged, nor contribute.  [name] 05/13/2015 , 2:31 PM

## 2015-05-13 NOTE — BHH Group Notes (Signed)
[place] Group Notes:  (Nursing/MHT/Case Management/Adjunct)  Date:  05/13/2015  Time:  6:50 PM  Type of Therapy:  Nurse Education  Participation Level:  None  Participation Quality:  Inattentive  Affect:  Flat and Irritable  Cognitive:  Lacking  Insight:  None  Engagement in Group:  None  Modes of Intervention:  Discussion and Education  Summary of Progress/Problems:  Group topic was Recovery. Discussed coping skills, goal setting and importance of sleep. [name] attended group, left early then returned.  She did not participate and glared at staff members during group.  Barbette Or Meital Riehl 05/13/2015, 6:50 PM

## 2015-05-13 NOTE — Progress Notes (Signed)
Patient ID: [name], female   DOB: 09-Jun-1970, 45 y.o.   MRN: [id number] D: Patient calm and cooperative from medication given earlier today. Pt mood and affect appeared depressed and flat. Pt denies SI/HI and pain.   A: Met with pt 1:1. PRN medication given for anxiety and sleep. Writer encouraged pt to discuss feelings and come to staff with any need or concern.   R: Patient is safe and compliant with medication.

## 2015-05-13 NOTE — Progress Notes (Signed)
Adult Psychoeducational Group Note  Date:  05/13/2015 Time:  8:15 PM  Group Topic/Focus:  Wrap-Up Group:   The focus of this group is to help patients review their daily goal of treatment and discuss progress on daily workbooks.  Participation Level:  Did Not Attend  Pt was asleep during wrap-up group.    Lincoln Brigham 05/13/2015, 8:49 PM

## 2015-05-13 NOTE — Progress Notes (Signed)
D- Patient is agitated this shift.  Patient is experiencing psychosis. She requested multiple times to go to the hospital because she had something wrong "down there" referring to her vagina.  Patient would not elaborate with the nurse. MD notified.  It was determined that patient wanted to go to the ED because a "machine" was trying to come out of her vagina.  Patient became increasingly irritable when unable to go the ED.  Patient was given PRN meds per MD order. See MAR.  Denies SI, HI, AVH, and pain.   Patient observed responding to internal stimuli and laughing inappropriately. A- Scheduled medications administered to patient, per MD orders. Support and encouragement provided.  Routine safety checks conducted every 15 minutes.  Patient informed to notify staff with problems or concerns. R- No adverse drug reactions noted. Patient contracts for safety at this time. Patient remains safe at this time.

## 2015-05-13 NOTE — Progress Notes (Signed)
[facility] MD Progress Note  05/13/2015 1:47 PM Keilee Lemmon  MRN:  [id number] Subjective:  Patient states " I need something for sleep. I do feel anxious , but its being anxious about not sleeping.'   Objective;Leily is a 13 y old AAF ,who has a hx of Schizoaffective do, cannabis abuse, tobacco abuse as well as HTN , who presented to [facility] brought in by EMS , and later on was admitted to [facility] for psychosis/mood lability.  Patient seen and chart reviewed.Discussed patient with treatment team.  I have reviewed previous notes in EHR , H&P per May NP. Pt today continues to be seen as irritable , guarded , paranoid, superficially cooperative , but refuses any medication readjustment. Pt has visible postural tremors - 2/2 to antipsychotic therapy , however pt refuses to be started on any medications for the same. Pt reports she knows that nothing will work . Pt seen as being labile , trying to imitate Probation officer as Probation officer spoke. Pt also seen as laughing to self.   Per staff - pt continues to be labile , seen as responding to internal stimuli , disruptive on the unit . Pt continues to need redirection.     Principal Problem: Schizoaffective disorder, bipolar type ([place]) Diagnosis:   Patient Active Problem List   Diagnosis Date Noted  . Tobacco use disorder [F17.200] 05/12/2015  . Cannabis use disorder, moderate, dependence ([place]) [F12.20] 05/12/2015  . Hypertension [I10] 02/24/2011  . ANEMIA-IRON DEFICIENCY [D50.9] 12/13/2006  . GERD [K21.9] 12/13/2006  . OBESITY [E66.9] 12/12/2006  . Schizoaffective disorder, bipolar type ([place]) [F25.0] 12/12/2006  . HEMORRHOIDS, NOS [K64.9] 12/12/2006  . PAIN-NECK [M54.2] 12/12/2006  . TREMOR [R25.9] 12/12/2006   Total Time spent with patient: 30 minutes  Past Psychiatric History: as per H&P  Past Medical History:  Past Medical History  Diagnosis Date  . Depression   . Bipolar 1 disorder ([place])   . Hemorrhoid   . Transfusion history   . Schizophrenia  ([employer])     Pt denies and reports it is schizoaffective disorder.   . Arthritis   . Bronchitis   . Schizoaffective disorder [facility])     Past Surgical History  Procedure Laterality Date  . Cesarean section     Family History:  Family History  Problem Relation Age of Onset  . Diabetes type II Other    Family Psychiatric  History: as per H&P Social History:  History  Alcohol Use No     History  Drug Use  . Yes  . Special: Marijuana    Comment: Last used: yesterday.     Social History   Social History  . Marital Status: Legally Separated    Spouse Name: N/A  . Number of Children: N/A  . Years of Education: N/A   Social History Main Topics  . Smoking status: Current Every Day Smoker -- 0.50 packs/day for 17 years    Types: Cigarettes  . Smokeless tobacco: Never Used  . Alcohol Use: No  . Drug Use: Yes    Special: Marijuana     Comment: Last used: yesterday.   Marland Kitchen Sexual Activity: Not Asked   Other Topics Concern  . None   Social History Narrative   ** Merged History Encounter **       Additional Social History:    Pain Medications:  (pt refusing to answer)                    Sleep: Fair  Appetite:  Fair  Current Medications: Current Facility-Administered Medications  Medication Dose Route Frequency Provider Last Rate Last Dose  . acetaminophen (TYLENOL) tablet 650 mg  650 mg Oral Q6H PRN Lurena Nida, NP      . LORazepam (ATIVAN) tablet 0.5 mg  0.5 mg Oral Q6H PRN Jenne Campus, MD      . nicotine (NICODERM CQ - dosed in mg/24 hours) patch 21 mg  21 mg Transdermal Daily Kerrie Buffalo, NP   21 mg at 05/13/15 0820  . OLANZapine (ZYPREXA) tablet 5 mg  5 mg Oral Q6H PRN Ursula Alert, MD      . perphenazine (TRILAFON) tablet 8 mg  8 mg Oral BID Lurena Nida, NP   8 mg at 05/13/15 0932  . pyridOXINE (VITAMIN B-6) tablet 100 mg  100 mg Oral Daily Lurena Nida, NP   100 mg at 05/13/15 0820  . traZODone (DESYREL) tablet 50 mg  50 mg Oral QHS PRN  Ursula Alert, MD        Lab Results:  No results found for this or any previous visit (from the past 48 hour(s)).  Physical Findings: AIMS: Facial and Oral Movements Muscles of Facial Expression: None, normal Lips and Perioral Area: None, normal Jaw: None, normal Tongue: None, normal,Extremity Movements Upper (arms, wrists, hands, fingers): None, normal Lower (legs, knees, ankles, toes): None, normal, Trunk Movements Neck, shoulders, hips: None, normal, Overall Severity Severity of abnormal movements (highest score from questions above): None, normal Incapacitation due to abnormal movements: None, normal Patient's awareness of abnormal movements (rate only patient's report): No Awareness, Dental Status Current problems with teeth and/or dentures?: No Does patient usually wear dentures?: No  CIWA:  CIWA-Ar Total: 0 COWS:  COWS Total Score: 0  Musculoskeletal: Strength & Muscle Tone: within normal limits Gait & Station: normal Patient leans: N/A  Psychiatric Specialty Exam: Review of Systems  Psychiatric/Behavioral: Positive for depression and substance abuse. The patient is nervous/anxious.   All other systems reviewed and are negative.   Blood pressure 105/76, pulse 84, temperature 98.4 F (36.9 C), temperature source Oral, resp. rate 16, height 5\' 4"  (1.626 m), weight 108.863 kg (240 lb).Body mass index is 41.18 kg/(m^2).  General Appearance: Disheveled  Eye Sport and exercise psychologist::  Fair  Speech:  Slow  Volume:  Decreased  Mood:  Anxious, Dysphoric and Irritable  Affect:  Inappropriate and Labile  Thought Process:  Disorganized  Orientation:  Full (Time, Place, and Person)  Thought Content:  Paranoid Ideation and Rumination seen as laughing to self, responding to internal stimuli  Suicidal Thoughts:  No  Homicidal Thoughts:  No  Memory:  Immediate;   Fair Recent;   Poor Remote;   Poor  Judgement:  Impaired  Insight:  Shallow  Psychomotor Activity:  Restlessness   Concentration:  Poor  Recall:  [place] of Knowledge:Fair  Language: Fair  Akathisia:  No  Handed:  Right  AIMS (if indicated):     Assets:  Others:  access to healthcare  ADL's:  Intact  Cognition: WNL  Sleep:  Number of Hours: 0   Treatment Plan Summary:Pt is a 59 y old AAF , who has a hx of schizoaffective do, presented with psychosis. Pt with sleep issues, mood lability . Pt will need continued treatment and stabilization on the inpatient unit. Daily contact with patient to assess and evaluate symptoms and progress in treatment and Medication management   Reviewed past medical records,treatment plan. Will continue Trilafon 8 mg po  bid for psychosis. Pt at this time does not want any medication changes. Will make available PRN medications as per agitation protocol. Will add Trazodone 50 mg po qhs prn for sleep. Pt at this time will not take any other medications. Will continue to monitor vitals ,medication compliance and treatment side effects while patient is here.  Will monitor for medical issues as well as call consult as needed.  Offer nicotine patch for smoking cessation. Reviewed labs , orderered TSH, PL , repeat BMP since pt presented with Hypokalemia and is on KDUR.Pt refused all labs this AM. CSW will start working on disposition.Will need to expand collateral information.  Patient to participate in therapeutic milieu .      Othella Slappey MD 05/13/2015, 1:47 PM

## 2015-05-14 MED ORDER — TRAZODONE HCL 100 MG PO TABS
100.0000 mg | ORAL_TABLET | Freq: Every day | ORAL | Status: DC
  Administered 2015-05-14 – 2015-05-15 (×2): 100 mg via ORAL
  Filled 2015-05-14 (×5): qty 1

## 2015-05-14 MED ORDER — PERPHENAZINE 4 MG PO TABS
8.0000 mg | ORAL_TABLET | Freq: Every day | ORAL | Status: DC
  Administered 2015-05-15 – 2015-05-20 (×6): 8 mg via ORAL
  Filled 2015-05-14: qty 2
  Filled 2015-05-14 (×3): qty 1
  Filled 2015-05-14 (×2): qty 2
  Filled 2015-05-14 (×2): qty 1
  Filled 2015-05-14: qty 2

## 2015-05-14 MED ORDER — PERPHENAZINE 8 MG PO TABS
12.0000 mg | ORAL_TABLET | Freq: Every day | ORAL | Status: DC
  Administered 2015-05-14 – 2015-05-16 (×3): 12 mg via ORAL
  Filled 2015-05-14 (×5): qty 1

## 2015-05-14 NOTE — Progress Notes (Signed)
DAR NOTE: Patient presents with anxious mood and affect.  Denies pain, auditory and visual hallucinations.  Rates depression at 0, hopelessness at 0, and anxiety at 0.  Describes energy level as normal and concentration as good.  Maintained on routine safety checks.  Medications given as prescribed.  Support and encouragement offered as needed.  Attended group and participated.  States goal for today is "to make it to all groups."  Offered no complaint.   Refused Amantadine medication after several encouragement and instructions.

## 2015-05-14 NOTE — Progress Notes (Signed)
D: Pt +ve AVH- not command denies SI/HI. Pt is pleasant and cooperative. Pt stated she was doing about the same. Pt observed on unit, but not much interaction.   A: Pt was offered support and encouragement. Pt was given scheduled medications. Pt was encourage to attend groups. Q 15 minute checks were done for safety.   R:Pt attends groups and interacts well with peers and staff. Pt is taking medication. Pt has no complaints at this time .Pt receptive to treatment and safety maintained on unit.

## 2015-05-14 NOTE — Progress Notes (Signed)
[facility] MD Progress Note  05/14/2015 3:51 PM Maday Godleski  MRN:  [id number] Subjective:  Patient states " I want another doctor , I want to go to the ED."    Objective;Ronnesha is a 11 y old AAF ,who has a hx of Schizoaffective do, cannabis abuse, tobacco abuse as well as HTN , who presented to [facility] brought in by EMS , and later on was admitted to [facility] for psychosis/mood lability.  Patient seen and chart reviewed.Discussed patient with treatment team.  I have reviewed previous notes in EHR , H&P per May NP. Pt today continues to be seen as irritable , initially was not cooperative with evaluation , demanding to be seen by another doctor . Pt also was requesting to be send to the ED and reported she wants her private parts to be checked out. Pt reports she has a machine in her vagina , but later on stated that she meant to say that she may have been injured while using a vibrator. Pt however , did not seem to be in pain , often seen as laughing inappropriately.  Per staff - pt continues to be psychotic, labile , continues to require PRN medications as well as encouragement and support.     Principal Problem: Schizoaffective disorder, bipolar type ([place]) Diagnosis:   Patient Active Problem List   Diagnosis Date Noted  . Tobacco use disorder [F17.200] 05/12/2015  . Cannabis use disorder, moderate, dependence ([place]) [F12.20] 05/12/2015  . Hypertension [I10] 02/24/2011  . ANEMIA-IRON DEFICIENCY [D50.9] 12/13/2006  . GERD [K21.9] 12/13/2006  . OBESITY [E66.9] 12/12/2006  . Schizoaffective disorder, bipolar type ([place]) [F25.0] 12/12/2006  . HEMORRHOIDS, NOS [K64.9] 12/12/2006  . PAIN-NECK [M54.2] 12/12/2006  . TREMOR [R25.9] 12/12/2006   Total Time spent with patient: 30 minutes  Past Psychiatric History: as per H&P  Past Medical History:  Past Medical History  Diagnosis Date  . Depression   . Bipolar 1 disorder ([place])   . Hemorrhoid   . Transfusion history   . Schizophrenia ([place])      Pt denies and reports it is schizoaffective disorder.   . Arthritis   . Bronchitis   . Schizoaffective disorder [facility])     Past Surgical History  Procedure Laterality Date  . Cesarean section     Family History:  Family History  Problem Relation Age of Onset  . Diabetes type II Other    Family Psychiatric  History: as per H&P Social History:  History  Alcohol Use No     History  Drug Use  . Yes  . Special: Marijuana    Comment: Last used: yesterday.     Social History   Social History  . Marital Status: Legally Separated    Spouse Name: N/A  . Number of Children: N/A  . Years of Education: N/A   Social History Main Topics  . Smoking status: Current Every Day Smoker -- 0.50 packs/day for 17 years    Types: Cigarettes  . Smokeless tobacco: Never Used  . Alcohol Use: No  . Drug Use: Yes    Special: Marijuana     Comment: Last used: yesterday.   Marland Kitchen Sexual Activity: Not Asked   Other Topics Concern  . None   Social History Narrative   ** Merged History Encounter **       Additional Social History:    Pain Medications:  (pt refusing to answer)  Sleep: Fair  Appetite:  Fair  Current Medications: Current Facility-Administered Medications  Medication Dose Route Frequency Provider Last Rate Last Dose  . acetaminophen (TYLENOL) tablet 650 mg  650 mg Oral Q6H PRN Lurena Nida, NP      . amantadine (SYMMETREL) capsule 100 mg  100 mg Oral BID Ursula Alert, MD   100 mg at 05/14/15 0947  . LORazepam (ATIVAN) tablet 0.5 mg  0.5 mg Oral Q6H PRN Jenne Campus, MD   0.5 mg at 05/13/15 2118  . nicotine (NICODERM CQ - dosed in mg/24 hours) patch 21 mg  21 mg Transdermal Daily Kerrie Buffalo, NP   21 mg at 05/14/15 0951  . OLANZapine (ZYPREXA) tablet 10 mg  10 mg Oral Q6H PRN Ursula Alert, MD       Or  . OLANZapine (ZYPREXA) injection 10 mg  10 mg Intramuscular Q6H PRN Ursula Alert, MD      . perphenazine (TRILAFON) tablet 12 mg  12  mg Oral Q supper Ursula Alert, MD      . Derrill Memo ON 05/15/2015] perphenazine (TRILAFON) tablet 8 mg  8 mg Oral Q breakfast Mustaf Antonacci, MD      . pyridOXINE (VITAMIN B-6) tablet 100 mg  100 mg Oral Daily Lurena Nida, NP   100 mg at 05/14/15 0946  . traZODone (DESYREL) tablet 100 mg  100 mg Oral QHS Ursula Alert, MD        Lab Results:  No results found for this or any previous visit (from the past 48 hour(s)).  Physical Findings: AIMS: Facial and Oral Movements Muscles of Facial Expression: None, normal Lips and Perioral Area: None, normal Jaw: None, normal Tongue: None, normal,Extremity Movements Upper (arms, wrists, hands, fingers): None, normal Lower (legs, knees, ankles, toes): None, normal, Trunk Movements Neck, shoulders, hips: None, normal, Overall Severity Severity of abnormal movements (highest score from questions above): None, normal Incapacitation due to abnormal movements: None, normal Patient's awareness of abnormal movements (rate only patient's report): No Awareness, Dental Status Current problems with teeth and/or dentures?: No Does patient usually wear dentures?: No  CIWA:  CIWA-Ar Total: 0 COWS:  COWS Total Score: 0  Musculoskeletal: Strength & Muscle Tone: within normal limits Gait & Station: normal Patient leans: N/A  Psychiatric Specialty Exam: Review of Systems  Psychiatric/Behavioral: Positive for depression and substance abuse. The patient is nervous/anxious.   All other systems reviewed and are negative.   Blood pressure 129/71, pulse 67, temperature 98.7 F (37.1 C), temperature source Oral, resp. rate 16, height 5\' 4"  (1.626 m), weight 108.863 kg (240 lb).Body mass index is 41.18 kg/(m^2).  General Appearance: Disheveled  Eye Sport and exercise psychologist::  Fair  Speech:  Slow  Volume:  Decreased  Mood:  Anxious, Dysphoric and Irritable  Affect:  Inappropriate and Labile  Thought Process:  Disorganized  Orientation:  Full (Time, Place, and Person)  Thought  Content:  Paranoid Ideation and Rumination seen as laughing to self, responding to internal stimuli, talking about a machine in her vagina  Suicidal Thoughts:  No  Homicidal Thoughts:  No  Memory:  Immediate;   Fair Recent;   Poor Remote;   Poor  Judgement:  Impaired  Insight:  Shallow  Psychomotor Activity:  Restlessness  Concentration:  Poor  Recall:  [place]  Language: Fair  Akathisia:  No  Handed:  Right  AIMS (if indicated):     Assets:  Others:  access to healthcare  ADL's:  Intact  Cognition:  WNL  Sleep:  Number of Hours: 6.25   Treatment Plan Summary:Pt is a 23 y old AAF , who has a hx of schizoaffective do, presented with psychosis. Pt continues to be labile and delusional. Pt will need continued treatment and stabilization on the inpatient unit. Daily contact with patient to assess and evaluate symptoms and progress in treatment and Medication management   Reviewed past medical records,treatment plan. Will increase Trilafon 8 mg po daily and 12 mg po qpm  for psychosis. Added Amantadine 100 mg po bid for postural tremors. Will make available PRN medications as per agitation protocol. Will increase Trazodone to 100 mg po qhs prn for sleep. Pt at this time will not take any other medications. Will continue to monitor vitals ,medication compliance and treatment side effects while patient is here.  Will monitor for medical issues as well as call consult as needed.  Offer nicotine patch for smoking cessation. Reviewed labs , orderered TSH, PL , repeat BMP .Pt refused all labs . CSW will start working on disposition.Will need to expand collateral information.  Patient to participate in therapeutic milieu .      Nnaemeka Samson MD 05/14/2015, 3:51 PM

## 2015-05-14 NOTE — Plan of Care (Signed)
Problem: Ineffective individual coping Goal: STG: Patient will remain free from self harm Outcome: Progressing Pt safe on the unit at this time     

## 2015-05-14 NOTE — Progress Notes (Signed)
Adult Psychoeducational Group Note  Date:  05/14/2015 Time:  8:15 PM  Group Topic/Focus:  Wrap-Up Group:   The focus of this group is to help patients review their daily goal of treatment and discuss progress on daily workbooks.  Participation Level:  Active  Participation Quality:  Appropriate  Affect:  Appropriate  Cognitive:  Appropriate  Insight: Appropriate  Engagement in Group:  Engaged  Modes of Intervention:  Discussion  Additional Comments:  Pt attended wrap-up group towards the end of group, and pt was pleasant. Pt rated her overall day a 6 out of 10 because of "my illness". Pt reported that her goal for the day was to attend all groups, which she says that she did not achieve.   Lincoln Brigham 05/14/2015, 8:56 PM

## 2015-05-14 NOTE — BHH Group Notes (Signed)
[place] LCSW Group Therapy  05/14/2015 1:23 PM  Type of Therapy: Group Therapy  Participation Level: Invited. Chose not to attend. In bed sleeping.  Summary of Progress/Problems: Shanon Brow from the [place] was here to tell his story of recovery and play his guitar.  Kara Mead. Marshell Levan 05/14/2015 1:23 PM

## 2015-05-14 NOTE — BHH Group Notes (Signed)
[facility] LCSW Aftercare Discharge Planning Group Note   05/14/2015 1:22 PM  Participation Quality:  Invited.Chose not to attend.   Georga Kaufmann

## 2015-05-15 MED ORDER — INFLUENZA VAC SPLIT QUAD 0.5 ML IM SUSY
0.5000 mL | PREFILLED_SYRINGE | INTRAMUSCULAR | Status: DC
  Filled 2015-05-15: qty 0.5

## 2015-05-15 MED ORDER — NICOTINE POLACRILEX 2 MG MT GUM
2.0000 mg | CHEWING_GUM | OROMUCOSAL | Status: DC | PRN
  Administered 2015-05-15 – 2015-05-20 (×9): 2 mg via ORAL
  Filled 2015-05-15 (×3): qty 1

## 2015-05-15 NOTE — Progress Notes (Signed)
Pt came to group late and did not participate in the discussion.

## 2015-05-15 NOTE — Tx Team (Signed)
Interdisciplinary Treatment Plan Update (Adult)  Date:  05/15/2015  Time Reviewed:  11:26 AM   Progress in Treatment: Attending groups: No. Participating in groups:  No. Taking medication as prescribed:  Yes. Tolerating medication:  Yes. Family/Significant othe contact made: Yes Patient understands diagnosis:  No. Pt is poor historian, lacks insight.Discussing patient identified problems/goals with staff:  Yes. Medical problems stabilized or resolved:  Yes. Denies suicidal/homicidal ideation: Yes. Issues/concerns per patient self-inventory:  Other:  Discharge Plan or Barriers: see below Reason for Continuation of Hospitalization: Delusions  Hallucinations Medication stabilization  Comments:  [name] is an 45 y.o. female. Patient is a poor historian. Patient reports living alone but participating with [facility] team for mental health services. Message left for [facility] by both CSW and intake assessor requesting a call back for collateral information. Patient reports she is here because some is wrong at her home although no one is there but refused to elaborate during initial assessment. Patient denies SI/HI, VH, and other self-injurious behaviors. Patient reports her psychiatrist stopped her medications 2 days ago because it was causing her auditory hallucinations and now she is not feeling herself. Patient reports poor sleep.   Estimated length of stay:  4-5 days   New goal(s): to gain collateral information and confirm appropriate d/c plan with [facility] and patient.   Additional Comments:  05/15/15: Pt continues to be labile and delusional. Pt will need continued treatment and stabilization on the inpatient unit. Will increase Trilafon 8 mg po daily and 12 mg po qpm for psychosis. Added Amantadine 100 mg po bid for postural tremors. Will make available PRN medications as per agitation protocol. Will increase Trazodone to 100 mg po qhs prn for sleep. Pt at this time  will not take any other medications.  Review of initial/current patient goals per problem list:  1. Goal(s): Patient will participate in aftercare plan  Met: Yes  Target date: at discharge  As evidenced by: Patient will participate within aftercare plan AEB aftercare provider and housing plan at discharge being identified.  1/23: Pt continues to refuse to complete PSA. Message left for [facility] requesting call back for collateral information and to confirm d/c plan.  05/15/15:  Return home, follow up ACT team  2. Goal (s): Patient will exhibit decreased depressive symptoms and suicidal ideations.  Met: Yes   Target date: at discharge  As evidenced by: Patient will utilize self rating of depression at 3 or below and demonstrate decreased signs of depression or be deemed stable for discharge by MD.  1/23:  Pt presents with depressed/agitated mood and flat affect. She continues to refuse to speak/answer questions.  05/15/15:  Denies depression today  3. Goal(s): Patient will demonstrate decreased signs and symptoms of psychosis.   Met:No.   Target date: at discharge  As evidenced by: Patient will utilize self rating of anxiety at 3 or below and demonstrated decreased signs of anxiety, or be deemed stable for discharge by MD  1/23: Pt denies AVH but continues to refuse to participate in assessment/possibly delusional and paranoid.  05/15/15:  Delusions and mood lability persist.  4. Goal(s): Patient will demonstrate decreased signs of withdrawal due to substance abuse  Met:Yes  Target date:at discharge   As evidenced by: Patient will produce a CIWA/COWS score of 0, have stable vitals signs, and no symptoms of withdrawal.  1/23: Pt positive for THC only. No withdrawal protocol and pt has stable vitals.   Attendees: Patient:  05/15/2015 11:26 AM   Family:   05/15/2015 11:26 AM   Physician:  Ursula Alert  05/15/2015 11:26 AM   Nursing:   Festus Aloe 05/15/2015  11:26 AM   Clinical Social Worker:  05/15/2015 11:26 AM   Clinical Social Worker: Roque Lias, LCSW  05/15/2015 11:26 AM   Other:  Gerline Legacy Nurse Case Manager 05/15/2015 11:26 AM   Other:   05/15/2015 11:26 AM   Other:   05/15/2015 11:26 AM   Other:  05/15/2015 11:26 AM   Other:  05/15/2015 11:26 AM   Other:  05/15/2015 11:26 AM    05/15/2015 11:26 AM    05/15/2015 11:26 AM    05/15/2015 11:26 AM    05/15/2015 11:26 AM    Scribe for Treatment Team:   Maxie Better, LCSW 05/15/2015 11:26 AM

## 2015-05-15 NOTE — Progress Notes (Signed)
Patient ID: [name], female   DOB: 1970-11-14, 45 y.o.   MRN: [id number]  D: Patient came to medication window this am. Explained medication and she took without issue. Has a paranoid glare when speaking to undersigned but was compliant with answering questions. Denies a/v hallucinations even though staff continues to see patient talking to self. Denies any SI/HI. She is very knowledgeable about the hospital process of taking medications and attending groups in order to be discharged and spoke about it to another peer. Pacing halls and bizarre dancing but no agitation noted. A: Staff will continue to monitor on q 15 minute checks, follow treatment plan, and give meds as ordered. R: Cooperative on the unit at this time.

## 2015-05-15 NOTE — Progress Notes (Signed)
[facility] MD Progress Note  05/15/2015 2:37 PM Palma Paulhamus  MRN:  [id number] Subjective:  Patient states " I am fine , I do feel anxious ,but I do not want to be on more medications .'     Objective;Jailee is a 19 y old AAF ,who has a hx of Schizoaffective do, cannabis abuse, tobacco abuse as well as HTN , who presented to [facility] brought in by EMS , and later on was admitted to [facility] for psychosis/mood lability.  Patient seen and chart reviewed.Discussed patient with treatment team.  I have reviewed previous notes in EHR , H&P per May NP. Pt today seen initially in the hallway - walking backward  And then is seen as pacing back and forth. Pt continues to need redirection and continues to be seen as irritable when redirected. Per staff pt continues to be paranoid , agitated on and off ,continues to need ativan prn . Will continue to encourage and support.      Principal Problem: Schizoaffective disorder, bipolar type ([place]) Diagnosis:   Patient Active Problem List   Diagnosis Date Noted  . Tobacco use disorder [F17.200] 05/12/2015  . Cannabis use disorder, moderate, dependence ([place]) [F12.20] 05/12/2015  . Hypertension [I10] 02/24/2011  . ANEMIA-IRON DEFICIENCY [D50.9] 12/13/2006  . GERD [K21.9] 12/13/2006  . OBESITY [E66.9] 12/12/2006  . Schizoaffective disorder, bipolar type ([place]) [F25.0] 12/12/2006  . HEMORRHOIDS, NOS [K64.9] 12/12/2006  . PAIN-NECK [M54.2] 12/12/2006  . TREMOR [R25.9] 12/12/2006   Total Time spent with patient: 30 minutes  Past Psychiatric History: as per H&P  Past Medical History:  Past Medical History  Diagnosis Date  . Depression   . Bipolar 1 disorder ([place])   . Hemorrhoid   . Transfusion history   . Schizophrenia ([place])     Pt denies and reports it is schizoaffective disorder.   . Arthritis   . Bronchitis   . Schizoaffective disorder [facility])     Past Surgical History  Procedure Laterality Date  . Cesarean section     Family History:  Family  History  Problem Relation Age of Onset  . Diabetes type II Other    Family Psychiatric  History: as per H&P Social History:  History  Alcohol Use No     History  Drug Use  . Yes  . Special: Marijuana    Comment: Last used: yesterday.     Social History   Social History  . Marital Status: Legally Separated    Spouse Name: N/A  . Number of Children: N/A  . Years of Education: N/A   Social History Main Topics  . Smoking status: Current Every Day Smoker -- 0.50 packs/day for 17 years    Types: Cigarettes  . Smokeless tobacco: Never Used  . Alcohol Use: No  . Drug Use: Yes    Special: Marijuana     Comment: Last used: yesterday.   Marland Kitchen Sexual Activity: Not Asked   Other Topics Concern  . None   Social History Narrative   ** Merged History Encounter **       Additional Social History:    Pain Medications:  (pt refusing to answer)                    Sleep: Fair  Appetite:  Fair  Current Medications: Current Facility-Administered Medications  Medication Dose Route Frequency Provider Last Rate Last Dose  . acetaminophen (TYLENOL) tablet 650 mg  650 mg Oral Q6H PRN Lurena Nida, NP  650 mg at 05/14/15 1913  . amantadine (SYMMETREL) capsule 100 mg  100 mg Oral BID Ursula Alert, MD   100 mg at 05/15/15 0833  . [START ON 05/16/2015] Influenza vac split quadrivalent PF (FLUARIX) injection 0.5 mL  0.5 mL Intramuscular Tomorrow-1000 Eleny Cortez, MD      . LORazepam (ATIVAN) tablet 0.5 mg  0.5 mg Oral Q6H PRN Jenne Campus, MD   0.5 mg at 05/14/15 2133  . nicotine polacrilex (NICORETTE) gum 2 mg  2 mg Oral PRN Roosevelt Bisher, MD      . OLANZapine (ZYPREXA) tablet 10 mg  10 mg Oral Q6H PRN Ursula Alert, MD       Or  . OLANZapine (ZYPREXA) injection 10 mg  10 mg Intramuscular Q6H PRN Sherylann Vangorden, MD      . perphenazine (TRILAFON) tablet 12 mg  12 mg Oral Q supper Ursula Alert, MD   12 mg at 05/14/15 1707  . perphenazine (TRILAFON) tablet 8 mg  8 mg Oral  Q breakfast Ursula Alert, MD   8 mg at 05/15/15 0833  . pyridOXINE (VITAMIN B-6) tablet 100 mg  100 mg Oral Daily Lurena Nida, NP   100 mg at 05/15/15 [id number]  . traZODone (DESYREL) tablet 100 mg  100 mg Oral QHS Ursula Alert, MD   100 mg at 05/14/15 2133    Lab Results:  No results found for this or any previous visit (from the past 48 hour(s)).  Physical Findings: AIMS: Facial and Oral Movements Muscles of Facial Expression: None, normal Lips and Perioral Area: None, normal Jaw: None, normal Tongue: None, normal,Extremity Movements Upper (arms, wrists, hands, fingers): None, normal Lower (legs, knees, ankles, toes): None, normal, Trunk Movements Neck, shoulders, hips: None, normal, Overall Severity Severity of abnormal movements (highest score from questions above): None, normal Incapacitation due to abnormal movements: None, normal Patient's awareness of abnormal movements (rate only patient's report): No Awareness, Dental Status Current problems with teeth and/or dentures?: No Does patient usually wear dentures?: No  CIWA:  CIWA-Ar Total: 0 COWS:  COWS Total Score: 0  Musculoskeletal: Strength & Muscle Tone: within normal limits Gait & Station: normal Patient leans: N/A  Psychiatric Specialty Exam: Review of Systems  Psychiatric/Behavioral: Positive for depression and substance abuse. The patient is nervous/anxious.   All other systems reviewed and are negative.   Blood pressure 122/61, pulse 104, temperature 99 F (37.2 C), temperature source Oral, resp. rate 12, height 5\' 4"  (1.626 m), weight 108.863 kg (240 lb).Body mass index is 41.18 kg/(m^2).  General Appearance: Disheveled  Eye Sport and exercise psychologist::  Fair  Speech:  Slow  Volume:  Decreased  Mood:  Anxious, Dysphoric and Irritable improving  Affect:  Inappropriate and Labile  Thought Process:  Disorganized more organized today  Orientation:  Full (Time, Place, and Person)  Thought Content:  Paranoid Ideation and  Rumination  responding to internal stimuli  Suicidal Thoughts:  No  Homicidal Thoughts:  No  Memory:  Immediate;   Fair Recent;   Poor Remote;   Poor  Judgement:  Impaired  Insight:  Shallow  Psychomotor Activity:  Restlessness  Concentration:  Poor  Recall:  [place]  Language: Fair  Akathisia:  No  Handed:  Right  AIMS (if indicated):     Assets:  Others:  access to healthcare  ADL's:  Intact  Cognition: WNL  Sleep:  Number of Hours: 6.5   Treatment Plan Summary:Pt is a 45 y old AAF , who  has a hx of schizoaffective do, presented with psychosis. Pt continues to be labile and delusional. Pt continues to refuse mood stabilizers. Pt wants to take only Trilafon. Pt will need continued treatment and stabilization on the inpatient unit. Daily contact with patient to assess and evaluate symptoms and progress in treatment and Medication management   Reviewed past medical records,treatment plan. Increased Trilafon 8 mg po daily and 12 mg po qpm  for psychosis. Will continue Amantadine 100 mg po bid for postural tremors. Will make available PRN medications as per agitation protocol. Will continue Trazodone  100 mg po qhs prn for sleep. Pt at this time will not take any other medications. Will continue to monitor vitals ,medication compliance and treatment side effects while patient is here.  Will monitor for medical issues as well as call consult as needed.  Offer nicotine patch for smoking cessation. Reviewed labs , orderered TSH, PL , repeat BMP .Pt refused all labs . CSW will start working on disposition.Will need to expand collateral information.  Patient to participate in therapeutic milieu .      Yogi Arther MD 05/15/2015, 2:37 PM

## 2015-05-15 NOTE — BHH Group Notes (Signed)
[place] LCSW Group Therapy  05/15/2015 1:15 pm  Type of Therapy: Process Group Therapy  Participation Level:  Active  Participation Quality:  Appropriate  Affect:  Flat  Cognitive:  Oriented  Insight:  Improving  Engagement in Group:  Limited  Engagement in Therapy:  Limited  Modes of Intervention:  Activity, Clarification, Education, Problem-solving and Support  Summary of Progress/Problems: Today's group addressed the issue of overcoming obstacles.  Patients were asked to identify their biggest obstacle post d/c that stands in the way of their on-going success, and then problem solve as to how to manage this. Made a brief appearance.  No contribution.  Trish Mage 05/15/2015   2:37 PM

## 2015-05-16 MED ORDER — TRAZODONE HCL 150 MG PO TABS
150.0000 mg | ORAL_TABLET | Freq: Every day | ORAL | Status: DC
  Administered 2015-05-16 – 2015-05-18 (×3): 150 mg via ORAL
  Filled 2015-05-16 (×5): qty 1

## 2015-05-16 NOTE — Plan of Care (Signed)
Problem: Ineffective individual coping Goal: STG: Patient will remain free from self harm Outcome: Progressing Pt safe on the unit at this time     

## 2015-05-16 NOTE — Progress Notes (Signed)
Recreation Therapy Notes   01.27.2017 approximately 2:30pm. LRT attempted to meet with patient, patient was observed to be in her bathroom when LRT entered room. LRT introduced herself and explained that MD requested that she meet with patient. Patient declined to speak with LRT at this time. LRT offered to return, patient stated "Not today." LRT assured her she would return Monday. Patient stated "Well I'll probably still be here then." LRT to return.   Laureen Ochs Akima Slaugh, LRT/CTRS   Lane Hacker 05/16/2015 2:44 PM

## 2015-05-16 NOTE — Progress Notes (Signed)
Pt did not attend wrap-up group meeting.

## 2015-05-16 NOTE — Progress Notes (Signed)
[facility] MD Progress Note  05/16/2015 11:39 AM [name]  MRN:  [id number] Subjective:  Patient states " I did not sleep last night, I had to get up several times since I was having my period. '     Objective;[name] is a 1 y old AAF ,who has a hx of Schizoaffective do, cannabis abuse, tobacco abuse as well as HTN , who presented to [facility] brought in by EMS , and later on was admitted to [facility] for psychosis/mood lability.  Patient seen and chart reviewed.Discussed patient with treatment team.  I have reviewed previous notes in EHR , H&P per May NP. Pt today seen as withdrawn , depressed , reports sleep issues last night. Pt however continues to refuse medication changes , other than what she is on at this time. Pt per staff continues to be paranoid , has mood lability and requires redirection on the unit. Will continue to encourage and support.      Principal Problem: Schizoaffective disorder, bipolar type ([place]) Diagnosis:   Patient Active Problem List   Diagnosis Date Noted  . Tobacco use disorder [F17.200] 05/12/2015  . Cannabis use disorder, moderate, dependence ([place]) [F12.20] 05/12/2015  . Hypertension [I10] 02/24/2011  . ANEMIA-IRON DEFICIENCY [D50.9] 12/13/2006  . GERD [K21.9] 12/13/2006  . OBESITY [E66.9] 12/12/2006  . Schizoaffective disorder, bipolar type ([place]) [F25.0] 12/12/2006  . HEMORRHOIDS, NOS [K64.9] 12/12/2006  . PAIN-NECK [M54.2] 12/12/2006  . TREMOR [R25.9] 12/12/2006   Total Time spent with patient: 30 minutes  Past Psychiatric History: as per H&P  Past Medical History:  Past Medical History  Diagnosis Date  . Depression   . Bipolar 1 disorder ([place])   . Hemorrhoid   . Transfusion history   . Schizophrenia ([place])     Pt denies and reports it is schizoaffective disorder.   . Arthritis   . Bronchitis   . Schizoaffective disorder [facility])     Past Surgical History  Procedure Laterality Date  . Cesarean section     Family History:  Family History   Problem Relation Age of Onset  . Diabetes type II Other    Family Psychiatric  History: as per H&P Social History:  History  Alcohol Use No     History  Drug Use  . Yes  . Special: Marijuana    Comment: Last used: yesterday.     Social History   Social History  . Marital Status: Legally Separated    Spouse Name: N/A  . Number of Children: N/A  . Years of Education: N/A   Social History Main Topics  . Smoking status: Current Every Day Smoker -- 0.50 packs/day for 17 years    Types: Cigarettes  . Smokeless tobacco: Never Used  . Alcohol Use: No  . Drug Use: Yes    Special: Marijuana     Comment: Last used: yesterday.   Marland Kitchen Sexual Activity: Not Asked   Other Topics Concern  . None   Social History Narrative   ** Merged History Encounter **       Additional Social History:    Pain Medications:  (pt refusing to answer)                    Sleep: Poor  Appetite:  Fair  Current Medications: Current Facility-Administered Medications  Medication Dose Route Frequency Provider Last Rate Last Dose  . acetaminophen (TYLENOL) tablet 650 mg  650 mg Oral Q6H PRN Lurena Nida, NP   650 mg at  05/16/15 0407  . amantadine (SYMMETREL) capsule 100 mg  100 mg Oral BID Ursula Alert, MD   100 mg at 05/16/15 0941  . Influenza vac split quadrivalent PF (FLUARIX) injection 0.5 mL  0.5 mL Intramuscular Tomorrow-1000 Charnetta Wulff, MD   0.5 mL at 05/16/15 1058  . LORazepam (ATIVAN) tablet 0.5 mg  0.5 mg Oral Q6H PRN Jenne Campus, MD   0.5 mg at 05/15/15 2140  . nicotine polacrilex (NICORETTE) gum 2 mg  2 mg Oral PRN Ursula Alert, MD   2 mg at 05/15/15 1655  . OLANZapine (ZYPREXA) tablet 10 mg  10 mg Oral Q6H PRN Ursula Alert, MD       Or  . OLANZapine (ZYPREXA) injection 10 mg  10 mg Intramuscular Q6H PRN Octavion Mollenkopf, MD      . perphenazine (TRILAFON) tablet 12 mg  12 mg Oral Q supper Ursula Alert, MD   12 mg at 05/15/15 1654  . perphenazine (TRILAFON) tablet 8  mg  8 mg Oral Q breakfast Ursula Alert, MD   8 mg at 05/16/15 0941  . pyridOXINE (VITAMIN B-6) tablet 100 mg  100 mg Oral Daily Lurena Nida, NP   100 mg at 05/16/15 0941  . traZODone (DESYREL) tablet 100 mg  100 mg Oral QHS Ursula Alert, MD   100 mg at 05/15/15 2140    Lab Results:  No results found for this or any previous visit (from the past 48 hour(s)).  Physical Findings: AIMS: Facial and Oral Movements Muscles of Facial Expression: None, normal Lips and Perioral Area: None, normal Jaw: None, normal Tongue: None, normal,Extremity Movements Upper (arms, wrists, hands, fingers): None, normal Lower (legs, knees, ankles, toes): None, normal, Trunk Movements Neck, shoulders, hips: None, normal, Overall Severity Severity of abnormal movements (highest score from questions above): None, normal Incapacitation due to abnormal movements: None, normal Patient's awareness of abnormal movements (rate only patient's report): No Awareness, Dental Status Current problems with teeth and/or dentures?: No Does patient usually wear dentures?: No  CIWA:  CIWA-Ar Total: 0 COWS:  COWS Total Score: 0  Musculoskeletal: Strength & Muscle Tone: within normal limits Gait & Station: normal Patient leans: N/A  Psychiatric Specialty Exam: Review of Systems  Psychiatric/Behavioral: Positive for depression and substance abuse. The patient is nervous/anxious and has insomnia.   All other systems reviewed and are negative.   Blood pressure 138/78, pulse 77, temperature 98.8 F (37.1 C), temperature source Oral, resp. rate 20, height 5\' 4"  (1.626 m), weight 108.863 kg (240 lb).Body mass index is 41.18 kg/(m^2).  General Appearance: Disheveled  Eye Sport and exercise psychologist::  Fair  Speech:  Slow  Volume:  Decreased  Mood:  Anxious, Dysphoric and Irritable improving  Affect:  Inappropriate and Labile  Thought Process:  Disorganized more organized today  Orientation:  Full (Time, Place, and Person)  Thought Content:   Paranoid Ideation and Rumination  responding to internal stimuli on and off  Suicidal Thoughts:  No  Homicidal Thoughts:  No  Memory:  Immediate;   Fair Recent;   Poor Remote;   Poor  Judgement:  Impaired  Insight:  Shallow  Psychomotor Activity:  Restlessness  Concentration:  Poor  Recall:  [place]  Language: Fair  Akathisia:  No  Handed:  Right  AIMS (if indicated):     Assets:  Others:  access to healthcare  ADL's:  Intact  Cognition: WNL  Sleep:  Number of Hours: 5   Treatment Plan Summary:Pt is a  43 y old AAF , who has a hx of schizoaffective do, presented with psychosis. Pt continues to be labile and delusional. Pt continues to refuse mood stabilizers. Pt wants to take only Trilafon. Pt will need continued treatment and stabilization on the inpatient unit. Daily contact with patient to assess and evaluate symptoms and progress in treatment and Medication management   Reviewed past medical records,treatment plan. Increased Trilafon 8 mg po daily and 12 mg po qpm  for psychosis. Will continue Amantadine 100 mg po bid for postural tremors. Will make available PRN medications as per agitation protocol. Will increase Trazodone to 150 mg po qhs prn for sleep. Symptomatic treatment for abdominal cramps. Will continue to monitor vitals ,medication compliance and treatment side effects while patient is here.  Will monitor for medical issues as well as call consult as needed.  Offer nicotine patch for smoking cessation. Reviewed labs , orderered TSH, PL , repeat BMP .Pt refused all labs . CSW will start working on disposition.Will need to expand collateral information.  Patient to participate in therapeutic milieu .      Phillip Sandler MD 05/16/2015, 11:39 AM

## 2015-05-16 NOTE — BHH Group Notes (Signed)
[place] LCSW Group Therapy  05/16/2015  1:05 PM  Type of Therapy:  Group therapy  Participation Level:  Active  Participation Quality:  Attentive  Affect:  Flat  Cognitive:  Oriented  Insight:  Limited  Engagement in Therapy:  Limited  Modes of Intervention:  Discussion, Socialization  Summary of Progress/Problems:  Chaplain was here to lead a group on themes of hope and courage.  Invited.  Chose to not attend.  Roque Lias B 05/16/2015 1:37 PM

## 2015-05-16 NOTE — Progress Notes (Signed)
D: Pt denies SI/HI/AVH. Pt is pleasant and cooperative. Pt avoidant. Pt very labile at times. Pt was crying earlier in the shift in her room not wanting to talk. Pt was later seen on the unit interacting with peers / staff joking. Pt continues to appear to be responding  A: Pt was offered support and encouragement. Pt was given scheduled medications. Pt was encourage to attend groups. Q 15 minute checks were done for safety.   R:Pt attends groups and interacts well with peers and staff. Pt is taking medication. Pt has no complaints at this time.Pt receptive to treatment and safety maintained on unit.

## 2015-05-16 NOTE — Progress Notes (Signed)
DAR NOTE: Patient is calm and keeps to herself.  Affect is flat and mood is labile. Denies pain, auditory and visual hallucinations.   Maintained on routine safety checks.  Medications given as prescribed.  Support and encouragement offered as needed.  Attended group and participated.  Patient observed socializing with peers in the dayroom.  Offered no complaint.  Patient refused self inventory form assessment after several encouragement.

## 2015-05-17 MED ORDER — PERPHENAZINE 4 MG PO TABS
12.0000 mg | ORAL_TABLET | Freq: Every day | ORAL | Status: DC
  Administered 2015-05-17 – 2015-05-19 (×3): 12 mg via ORAL
  Filled 2015-05-17 (×5): qty 3

## 2015-05-17 NOTE — Plan of Care (Signed)
Problem: Alteration in mood & ability to function due to Goal: LTG-Pt reports reduction in suicidal thoughts (Patient reports reduction in suicidal thoughts and is able to verbalize a safety plan for whenever patient is feeling suicidal)  Outcome: Progressing Pt denies SI at this time     

## 2015-05-17 NOTE — BHH Group Notes (Signed)
[place] Group Notes: (Clinical Social Work)   05/17/2015      Type of Therapy:  Group Therapy   Participation Level:  Did Not Attend despite MHT prompting   Carma Sullins, LCSW 05/17/2015, 12:18 PM

## 2015-05-17 NOTE — Progress Notes (Signed)
DAR NOTE: Patient presents with anxious affect and depressed mood.  Denies pain, auditory and visual hallucinations.  Rates depression at 0, hopelessness at 0, and anxiety at 0.  Describes energy level as normal and concentration as good.  Maintained on routine safety checks.  Medications given as prescribed.  Support and encouragement offered as needed.  Attended group and participated.  States goal for today is "groups."

## 2015-05-17 NOTE — Progress Notes (Signed)
[facility] MD Progress Note  05/17/2015  Linden Carolla  MRN:  [id number] Subjective:  Patient states " I feel a lot better today actually. I feel like I'm ready to go soon."  Objective;Marlise is a 5 y old AAF ,who has a hx of Schizoaffective do, cannabis abuse, tobacco abuse as well as HTN , who presented to [facility] brought in by EMS , and later on was admitted to [facility] for psychosis/mood lability.  Pt seen and chart reviewed. Pt is alert/oriented x4, calm, cooperative, and appropriate to situation. Pt denies suicidal/homicidal ideation and psychosis and it is not clear if she is responding to internal stimuli due to pauses in her responses. Pt is vague in her responses about how she is feeling yet indicates she feels better overall. Will continue to monitor for congruent affect.    Principal Problem: Schizoaffective disorder, bipolar type ([place]) Diagnosis:   Patient Active Problem List   Diagnosis Date Noted  . Tobacco use disorder [F17.200] 05/12/2015  . Cannabis use disorder, moderate, dependence ([place]) [F12.20] 05/12/2015  . Hypertension [I10] 02/24/2011  . ANEMIA-IRON DEFICIENCY [D50.9] 12/13/2006  . GERD [K21.9] 12/13/2006  . OBESITY [E66.9] 12/12/2006  . Schizoaffective disorder, bipolar type ([place]) [F25.0] 12/12/2006  . HEMORRHOIDS, NOS [K64.9] 12/12/2006  . PAIN-NECK [M54.2] 12/12/2006  . TREMOR [R25.9] 12/12/2006   Total Time spent with patient: 15 minutes  Past Psychiatric History: as per H&P  Past Medical History:  Past Medical History  Diagnosis Date  . Depression   . Bipolar 1 disorder ([place])   . Hemorrhoid   . Transfusion history   . Schizophrenia ([place])     Pt denies and reports it is schizoaffective disorder.   . Arthritis   . Bronchitis   . Schizoaffective disorder [facility])     Past Surgical History  Procedure Laterality Date  . Cesarean section     Family History:  Family History  Problem Relation Age of Onset  . Diabetes type II Other    Family Psychiatric   History: as per H&P Social History:  History  Alcohol Use No     History  Drug Use  . Yes  . Special: Marijuana    Comment: Last used: yesterday.     Social History   Social History  . Marital Status: Legally Separated    Spouse Name: N/A  . Number of Children: N/A  . Years of Education: N/A   Social History Main Topics  . Smoking status: Current Every Day Smoker -- 0.50 packs/day for 17 years    Types: Cigarettes  . Smokeless tobacco: Never Used  . Alcohol Use: No  . Drug Use: Yes    Special: Marijuana     Comment: Last used: yesterday.   Marland Kitchen Sexual Activity: Not Asked   Other Topics Concern  . None   Social History Narrative   ** Merged History Encounter **       Additional Social History:    Pain Medications:  (pt refusing to answer)                    Sleep: Fair yet improving  Appetite:  Fair  Current Medications: Current Facility-Administered Medications  Medication Dose Route Frequency Provider Last Rate Last Dose  . acetaminophen (TYLENOL) tablet 650 mg  650 mg Oral Q6H PRN Lurena Nida, NP   650 mg at 05/17/15 0239  . amantadine (SYMMETREL) capsule 100 mg  100 mg Oral BID Ursula Alert, MD   100 mg  at 05/17/15 1717  . Influenza vac split quadrivalent PF (FLUARIX) injection 0.5 mL  0.5 mL Intramuscular Tomorrow-1000 Saramma Eappen, MD   0.5 mL at 05/16/15 1058  . LORazepam (ATIVAN) tablet 0.5 mg  0.5 mg Oral Q6H PRN Jenne Campus, MD   0.5 mg at 05/16/15 2201  . nicotine polacrilex (NICORETTE) gum 2 mg  2 mg Oral PRN Ursula Alert, MD   2 mg at 05/16/15 1725  . OLANZapine (ZYPREXA) tablet 10 mg  10 mg Oral Q6H PRN Ursula Alert, MD       Or  . OLANZapine (ZYPREXA) injection 10 mg  10 mg Intramuscular Q6H PRN Saramma Eappen, MD      . perphenazine (TRILAFON) tablet 12 mg  12 mg Oral Q supper Ursula Alert, MD   12 mg at 05/17/15 1717  . perphenazine (TRILAFON) tablet 8 mg  8 mg Oral Q breakfast Ursula Alert, MD   8 mg at 05/17/15 0949   . pyridOXINE (VITAMIN B-6) tablet 100 mg  100 mg Oral Daily Lurena Nida, NP   100 mg at 05/17/15 0949  . traZODone (DESYREL) tablet 150 mg  150 mg Oral QHS Ursula Alert, MD   150 mg at 05/16/15 2201    Lab Results:  No results found for this or any previous visit (from the past 48 hour(s)).  Physical Findings: AIMS: Facial and Oral Movements Muscles of Facial Expression: None, normal Lips and Perioral Area: None, normal Jaw: None, normal Tongue: None, normal,Extremity Movements Upper (arms, wrists, hands, fingers): None, normal Lower (legs, knees, ankles, toes): None, normal, Trunk Movements Neck, shoulders, hips: None, normal, Overall Severity Severity of abnormal movements (highest score from questions above): None, normal Incapacitation due to abnormal movements: None, normal Patient's awareness of abnormal movements (rate only patient's report): No Awareness, Dental Status Current problems with teeth and/or dentures?: No Does patient usually wear dentures?: No  CIWA:  CIWA-Ar Total: 0 COWS:  COWS Total Score: 0  Musculoskeletal: Strength & Muscle Tone: within normal limits Gait & Station: normal Patient leans: N/A  Psychiatric Specialty Exam: Review of Systems  Psychiatric/Behavioral: Positive for depression and substance abuse. The patient is nervous/anxious and has insomnia.   All other systems reviewed and are negative.   Blood pressure 131/79, pulse 80, temperature 99.2 F (37.3 C), temperature source Oral, resp. rate 16, height 5\' 4"  (1.626 m), weight 108.863 kg (240 lb).Body mass index is 41.18 kg/(m^2).  General Appearance: Casual and Fairly Groomed  Engineer, water::  Fair  Speech:  Slow  Volume:  Decreased  Mood:  Anxious, Dysphoric and Irritable improving  Affect:  Appropriate  Thought Process:  Disorganized more organized today  Orientation:  Full (Time, Place, and Person)  Thought Content:  Paranoid Ideation and Rumination  responding to internal stimuli  on and off yet improving  Suicidal Thoughts:  No  Homicidal Thoughts:  No  Memory:  Immediate;   Fair Recent;   Poor Remote;   Poor  Judgement:  Impaired  Insight:  Shallow  Psychomotor Activity:  Restlessness  Concentration:  Poor  Recall:  [place] Knowledge:Fair  Language: Fair  Akathisia:  No  Handed:  Right  AIMS (if indicated):     Assets:  Others:  access to healthcare  ADL's:  Intact  Cognition: WNL  Sleep:  Number of Hours: 3.25   Treatment Plan Summary:Pt is a 60 y old AAF , who has a hx of schizoaffective do, presented with psychosis. Pt continues to  be labile and delusional. Pt continues to refuse mood stabilizers. Pt wants to take only Trilafon. Pt will need continued treatment and stabilization on the inpatient unit.   Today on 05/17/2015, pt appears to be slightly improved in terms of being emotionally reactive (positively) during the interview although vague and slow to respond.    Daily contact with patient to assess and evaluate symptoms and progress in treatment and Medication management   Reviewed past medical records,treatment plan. Continue Trilafon 8 mg po daily and 12 mg po qpm  for psychosis. Will continue Amantadine 100 mg po bid for postural tremors. Will make available PRN medications as per agitation protocol. Will increase Trazodone to 150 mg po qhs prn for sleep. Symptomatic treatment for abdominal cramps. Will continue to monitor vitals ,medication compliance and treatment side effects while patient is here.  Will monitor for medical issues as well as call consult as needed.  Offer nicotine patch for smoking cessation. Reviewed labs , orderered TSH, PL , repeat BMP .Pt refused all labs . CSW will start working on disposition.Will need to expand collateral information.  Patient to participate in therapeutic milieu .   Benjamine Mola, FNP-BC 05/17/2015, 3:59PM I agree with assessment and plan Geralyn Flash A. Sabra Heck, M.D.

## 2015-05-17 NOTE — Progress Notes (Signed)
D: Pt denies SI/HI/AVH. Pt is pleasant and cooperative. Pt stated she wanted to find another place to live due to conflict with neighbors and noise. Pt sparingly visible on the unit, but pt has flat affect, but does brighten up when talking at times. Pt does appear to be thought blocking at times, but not as much as previous days.   A: Pt was offered support and encouragement. Pt was given scheduled medications. Pt was encourage to attend groups. Q 15 minute checks were done for safety.   R:Pt attends groups and interacts well with peers and staff. Pt is taking medication. Pt has no complaints at this time .Pt receptive to treatment and safety maintained on unit.

## 2015-05-18 NOTE — BHH Group Notes (Signed)
[place] Group Notes:  (Clinical Social Work)  05/18/2015  [place] Group Notes:  (Clinical Social Work)  05/18/2015  11:00AM-12:00PM  Summary of Progress/Problems:  The main focus of today's process group was to listen to a variety of genres of music and to identify that different types of music provoke different responses.  The patient then was able to identify personally what was soothing for them, as well as energizing.   The patient was not present at the beginning of group.  She came for awhile to group and danced in her chair a lot, seemed to enjoy the music.  Type of Therapy:  Music Therapy   Participation Level:  Active  Participation Quality:  Attentive   Affect:  Blunted  Cognitive:  Oriented  Insight:  Engaged  Engagement in Therapy:  Engaged  Modes of Intervention:   Activity, Exploration  Shalyse Kearley, LCSW 05/18/2015

## 2015-05-18 NOTE — Progress Notes (Signed)
Did not attend group 

## 2015-05-18 NOTE — Progress Notes (Signed)
DAR NOTE: Patient presents with flat affect and depressed mood.  Remained isolative to her room.  Denies pain, auditory and visual hallucinations.  Refused self inventory assessment after several encouragement.  Maintained on routine safety checks.  Medications given as prescribed.  Support and encouragement offered as needed.  Attended group and participated.

## 2015-05-18 NOTE — Progress Notes (Signed)
  D: Pt approached the Probation officer and informed the Probation officer that she wants to "get her ativan early tonight". Writer asked pt if she wanted to take the med along with her hs medication, as she'd done the night before.  Pt has no other questions or concerns.   A: Pt agreed and waited to get her med during hs med pass. Support and encouragement was offered. 15 min checks continued for safety.  R: Pt remains safe.

## 2015-05-18 NOTE — Progress Notes (Signed)
[facility] MD Progress Note  05/18/2015  [name]  MRN:  [id number] Subjective:  Patient states " I feel a lot better today actually. I feel like I'm ready to go soon."  Objective;[name] is a 47 y old AAF ,who has a hx of Schizoaffective do, cannabis abuse, tobacco abuse as well as HTN , who presented to [facility] brought in by EMS , and later on was admitted to [facility] for psychosis/mood lability.  Pt seen and chart reviewed. Pt is alert/oriented x4, calm, cooperative, and appropriate to situation. Pt denies suicidal/homicidal ideation and psychosis and does not appear to be responding to internal stimuli. Pt presents as more lucid and coherent today and states that she feels her medications are working. Will continue current regimen.   Principal Problem: Schizoaffective disorder, bipolar type ([place]) Diagnosis:   Patient Active Problem List   Diagnosis Date Noted  . Tobacco use disorder [F17.200] 05/12/2015  . Cannabis use disorder, moderate, dependence ([place]) [F12.20] 05/12/2015  . Hypertension [I10] 02/24/2011  . ANEMIA-IRON DEFICIENCY [D50.9] 12/13/2006  . GERD [K21.9] 12/13/2006  . OBESITY [E66.9] 12/12/2006  . Schizoaffective disorder, bipolar type ([place]) [F25.0] 12/12/2006  . HEMORRHOIDS, NOS [K64.9] 12/12/2006  . PAIN-NECK [M54.2] 12/12/2006  . TREMOR [R25.9] 12/12/2006   Total Time spent with patient: 15 minutes  Past Psychiatric History: as per H&P  Past Medical History:  Past Medical History  Diagnosis Date  . Depression   . Bipolar 1 disorder ([place])   . Hemorrhoid   . Transfusion history   . Schizophrenia ([place])     Pt denies and reports it is schizoaffective disorder.   . Arthritis   . Bronchitis   . Schizoaffective disorder [facility])     Past Surgical History  Procedure Laterality Date  . Cesarean section     Family History:  Family History  Problem Relation Age of Onset  . Diabetes type II Other    Family Psychiatric  History: as per H&P Social History:  History   Alcohol Use No     History  Drug Use  . Yes  . Special: Marijuana    Comment: Last used: yesterday.     Social History   Social History  . Marital Status: Legally Separated    Spouse Name: N/A  . Number of Children: N/A  . Years of Education: N/A   Social History Main Topics  . Smoking status: Current Every Day Smoker -- 0.50 packs/day for 17 years    Types: Cigarettes  . Smokeless tobacco: Never Used  . Alcohol Use: No  . Drug Use: Yes    Special: Marijuana     Comment: Last used: yesterday.   Marland Kitchen Sexual Activity: Not Asked   Other Topics Concern  . None   Social History Narrative   ** Merged History Encounter **       Additional Social History:    Pain Medications:  (pt refusing to answer)                    Sleep: Fair yet improving  Appetite:  Fair  Current Medications: Current Facility-Administered Medications  Medication Dose Route Frequency Provider Last Rate Last Dose  . acetaminophen (TYLENOL) tablet 650 mg  650 mg Oral Q6H PRN Lurena Nida, NP   650 mg at 05/17/15 0239  . amantadine (SYMMETREL) capsule 100 mg  100 mg Oral BID Ursula Alert, MD   100 mg at 05/18/15 [id number]  . Influenza vac split quadrivalent PF (FLUARIX) injection  0.5 mL  0.5 mL Intramuscular Tomorrow-1000 Saramma Eappen, MD   0.5 mL at 05/16/15 1058  . LORazepam (ATIVAN) tablet 0.5 mg  0.5 mg Oral Q6H PRN Jenne Campus, MD   0.5 mg at 05/18/15 0317  . nicotine polacrilex (NICORETTE) gum 2 mg  2 mg Oral PRN Ursula Alert, MD   2 mg at 05/16/15 1725  . OLANZapine (ZYPREXA) tablet 10 mg  10 mg Oral Q6H PRN Ursula Alert, MD       Or  . OLANZapine (ZYPREXA) injection 10 mg  10 mg Intramuscular Q6H PRN Saramma Eappen, MD      . perphenazine (TRILAFON) tablet 12 mg  12 mg Oral Q supper Ursula Alert, MD   12 mg at 05/17/15 1717  . perphenazine (TRILAFON) tablet 8 mg  8 mg Oral Q breakfast Ursula Alert, MD   8 mg at 05/18/15 [id number]  . pyridOXINE (VITAMIN B-6) tablet 100 mg  100  mg Oral Daily Lurena Nida, NP   100 mg at 05/18/15 [id number]  . traZODone (DESYREL) tablet 150 mg  150 mg Oral QHS Ursula Alert, MD   150 mg at 05/17/15 2107    Lab Results:  No results found for this or any previous visit (from the past 48 hour(s)).  Physical Findings: AIMS: Facial and Oral Movements Muscles of Facial Expression: None, normal Lips and Perioral Area: None, normal Jaw: None, normal Tongue: None, normal,Extremity Movements Upper (arms, wrists, hands, fingers): None, normal Lower (legs, knees, ankles, toes): None, normal, Trunk Movements Neck, shoulders, hips: None, normal, Overall Severity Severity of abnormal movements (highest score from questions above): None, normal Incapacitation due to abnormal movements: None, normal Patient's awareness of abnormal movements (rate only patient's report): No Awareness, Dental Status Current problems with teeth and/or dentures?: No Does patient usually wear dentures?: No  CIWA:  CIWA-Ar Total: 0 COWS:  COWS Total Score: 0  Musculoskeletal: Strength & Muscle Tone: within normal limits Gait & Station: normal Patient leans: N/A  Psychiatric Specialty Exam: Review of Systems  Psychiatric/Behavioral: Positive for depression and substance abuse. The patient is nervous/anxious and has insomnia.   All other systems reviewed and are negative.   Blood pressure 122/57, pulse 68, temperature 98.6 F (37 C), temperature source Oral, resp. rate 14, height 5\' 4"  (1.626 m), weight 108.863 kg (240 lb).Body mass index is 41.18 kg/(m^2).  General Appearance: Casual and Fairly Groomed  Engineer, water::  Fair  Speech:  Slow  Volume:  Decreased  Mood:  Anxious, Dysphoric and Irritable improving  Affect:  Appropriate  Thought Process:  Disorganized more organized today  Orientation:  Full (Time, Place, and Person)  Thought Content:  Paranoid Ideation and Rumination  responding to internal stimuli on and off yet improving  Suicidal Thoughts:  No   Homicidal Thoughts:  No  Memory:  Immediate;   Fair Recent;   Poor Remote;   Poor  Judgement:  Impaired  Insight:  Shallow  Psychomotor Activity:  Restlessness  Concentration:  Poor  Recall:  [place]  Language: Fair  Akathisia:  No  Handed:  Right  AIMS (if indicated):     Assets:  Others:  access to healthcare  ADL's:  Intact  Cognition: WNL  Sleep:  Number of Hours: 3   Treatment Plan Summary:Pt is a 55 y old AAF , who has a hx of schizoaffective do, presented with psychosis. Pt continues to be labile and delusional. Pt continues to refuse mood stabilizers. Pt wants  to take only Trilafon. Pt will need continued treatment and stabilization on the inpatient unit.   Today on 05/18/2015, pt appears to be slightly improved in terms of being able to clearly focus on the conversation during assessment. Cites subjective improvement as well in depression/anxiety/lucidity.    Daily contact with patient to assess and evaluate symptoms and progress in treatment and Medication management  Reviewed past medical records,treatment plan. Continue Trilafon 8 mg po daily and 12 mg po qpm  for psychosis. Will continue Amantadine 100 mg po bid for postural tremors. Will make available PRN medications as per agitation protocol. Will increase Trazodone to 150 mg po qhs prn for sleep. Symptomatic treatment for abdominal cramps. Will continue to monitor vitals ,medication compliance and treatment side effects while patient is here.  Will monitor for medical issues as well as call consult as needed.  Offer nicotine patch for smoking cessation. Reviewed labs , orderered TSH, PL , repeat BMP .Pt refused all labs . CSW will start working on disposition.Will need to expand collateral information.  Patient to participate in therapeutic milieu .   Benjamine Mola, FNP-BC 05/18/2015, 4:50PM I agree with assessment and plan Geralyn Flash A. Sabra Heck, M.D.

## 2015-05-19 MED ORDER — LORAZEPAM 0.5 MG PO TABS: 0.5000 mg | ORAL_TABLET | Freq: Two times a day (BID) | ORAL | Status: DC | PRN

## 2015-05-19 MED ORDER — TRAZODONE HCL 50 MG PO TABS
175.0000 mg | ORAL_TABLET | Freq: Every day | ORAL | Status: DC
  Administered 2015-05-19: 175 mg via ORAL
  Filled 2015-05-19 (×3): qty 3.5

## 2015-05-19 NOTE — Progress Notes (Signed)
Patient took nap this afternoon.  Patient talked to nurse after her nap, stated she has felt very tired today.  Patient denied SI and HI, contracts for safety.  Denied A/V hallucinations.  Denied depression, anxiety and hopeless.  Stated she plans to return home after discharge from [facility].  Respirations even and unlabored.  No signs/symptoms of pain/distress noted on patient's face/body movements.  Safety maintained with 15 minute checks.

## 2015-05-19 NOTE — BHH Group Notes (Signed)
[place] LCSW Group Therapy  05/19/2015 3:05 PM   Type of Therapy:  Group Therapy  Participation Level:  Active  Participation Quality:  Attentive  Affect:  Appropriate  Cognitive:  Appropriate  Insight:  Improving  Engagement in Therapy:  Engaged  Modes of Intervention:  Clarification, Education, Exploration and Socialization  Summary of Progress/Problems: Today's group focused on relapse prevention.  We defined the term, and then brainstormed on ways to prevent relapse.  Came to group initially.  Stayed for 10 minutes.  No contributions to discussion.  Left and did not return.  Roque Lias B 05/19/2015 , 3:05 PM

## 2015-05-19 NOTE — Plan of Care (Signed)
Problem: Alteration in mood & ability to function due to Goal: LTG-Pt reports reduction in suicidal thoughts (Patient reports reduction in suicidal thoughts and is able to verbalize a safety plan for whenever patient is feeling suicidal)  Outcome: Progressing Pt denies SI at this time     

## 2015-05-19 NOTE — Progress Notes (Signed)
Recreation Therapy Notes  01.30.2017 LRT has returned at approximately 8:40am and 2:45pm both time patient was observed to be laying down in bed facing away from door. Patient does not respond to her name being called. LRT will continue to attempt to meet with patient during admission.   Laureen Ochs Valyncia Wiens, LRT/CTRS   Veronique Warga L 05/19/2015 3:06 PM

## 2015-05-19 NOTE — Progress Notes (Signed)
[facility] MD Progress Note  05/19/2015  Keva Strid  MRN:  [id number] Subjective:  Patient states " I feel so tired today , I did not sleep all that well last night. I am not sure why.'   Objective;Aaliah is a 15 y old AAF ,who has a hx of Schizoaffective do, cannabis abuse, tobacco abuse as well as HTN , who presented to [facility] brought in by EMS , and later on was admitted to [facility] for psychosis/mood lability.  Pt seen and chart reviewed. Pt is alert/oriented x4, appears to be anxious , withdrawn. Pt reports that she continues to struggle with sleep medications. Pt also has menstrual cramps - asked her to make use of PRN medications. Pt per staff - continues to be withdrawn mostly , stays in bed - does not attend groups unless encouraged to. Pt otherwise tolerating medications well. Discussed increasing her sleep medications. Pt also encouraged to attend group activities on the unit.    Principal Problem: Schizoaffective disorder, bipolar type ([place]) Diagnosis:   Patient Active Problem List   Diagnosis Date Noted  . Tobacco use disorder [F17.200] 05/12/2015  . Cannabis use disorder, moderate, dependence ([place]) [F12.20] 05/12/2015  . Hypertension [I10] 02/24/2011  . ANEMIA-IRON DEFICIENCY [D50.9] 12/13/2006  . GERD [K21.9] 12/13/2006  . OBESITY [E66.9] 12/12/2006  . Schizoaffective disorder, bipolar type ([place]) [F25.0] 12/12/2006  . HEMORRHOIDS, NOS [K64.9] 12/12/2006  . PAIN-NECK [M54.2] 12/12/2006  . TREMOR [R25.9] 12/12/2006   Total Time spent with patient: 25  minutes  Past Psychiatric History: as per H&P  Past Medical History:  Past Medical History  Diagnosis Date  . Depression   . Bipolar 1 disorder ([place])   . Hemorrhoid   . Transfusion history   . Schizophrenia ([place])     Pt denies and reports it is schizoaffective disorder.   . Arthritis   . Bronchitis   . Schizoaffective disorder [facility])     Past Surgical History  Procedure Laterality Date  . Cesarean section      Family History:  Family History  Problem Relation Age of Onset  . Diabetes type II Other    Family Psychiatric  History: as per H&P Social History:  History  Alcohol Use No     History  Drug Use  . Yes  . Special: Marijuana    Comment: Last used: yesterday.     Social History   Social History  . Marital Status: Legally Separated    Spouse Name: N/A  . Number of Children: N/A  . Years of Education: N/A   Social History Main Topics  . Smoking status: Current Every Day Smoker -- 0.50 packs/day for 17 years    Types: Cigarettes  . Smokeless tobacco: Never Used  . Alcohol Use: No  . Drug Use: Yes    Special: Marijuana     Comment: Last used: yesterday.   Marland Kitchen Sexual Activity: Not Asked   Other Topics Concern  . None   Social History Narrative   ** Merged History Encounter **       Additional Social History:    Pain Medications:  (pt refusing to answer)                    Sleep: restless last night  Appetite:  Fair  Current Medications: Current Facility-Administered Medications  Medication Dose Route Frequency Provider Last Rate Last Dose  . acetaminophen (TYLENOL) tablet 650 mg  650 mg Oral Q6H PRN Lurena Nida, NP  650 mg at 05/17/15 0239  . amantadine (SYMMETREL) capsule 100 mg  100 mg Oral BID Ursula Alert, MD   100 mg at 05/19/15 0938  . Influenza vac split quadrivalent PF (FLUARIX) injection 0.5 mL  0.5 mL Intramuscular Tomorrow-1000 Modell Fendrick, MD   0.5 mL at 05/16/15 1058  . LORazepam (ATIVAN) tablet 0.5 mg  0.5 mg Oral BID PRN Ursula Alert, MD      . nicotine polacrilex (NICORETTE) gum 2 mg  2 mg Oral PRN Ursula Alert, MD   2 mg at 05/19/15 0942  . OLANZapine (ZYPREXA) tablet 10 mg  10 mg Oral Q6H PRN Ursula Alert, MD       Or  . OLANZapine (ZYPREXA) injection 10 mg  10 mg Intramuscular Q6H PRN Thaxton Pelley, MD      . perphenazine (TRILAFON) tablet 12 mg  12 mg Oral Q supper Ursula Alert, MD   12 mg at 05/18/15 1659  .  perphenazine (TRILAFON) tablet 8 mg  8 mg Oral Q breakfast Ursula Alert, MD   8 mg at 05/19/15 0938  . pyridOXINE (VITAMIN B-6) tablet 100 mg  100 mg Oral Daily Lurena Nida, NP   100 mg at 05/19/15 [id number]  . traZODone (DESYREL) tablet 175 mg  175 mg Oral QHS Ursula Alert, MD        Lab Results:  No results found for this or any previous visit (from the past 48 hour(s)).  Physical Findings: AIMS: Facial and Oral Movements Muscles of Facial Expression: None, normal Lips and Perioral Area: None, normal Jaw: None, normal Tongue: None, normal,Extremity Movements Upper (arms, wrists, hands, fingers): None, normal Lower (legs, knees, ankles, toes): None, normal, Trunk Movements Neck, shoulders, hips: None, normal, Overall Severity Severity of abnormal movements (highest score from questions above): None, normal Incapacitation due to abnormal movements: None, normal Patient's awareness of abnormal movements (rate only patient's report): No Awareness, Dental Status Current problems with teeth and/or dentures?: No Does patient usually wear dentures?: No  CIWA:  CIWA-Ar Total: 0 COWS:  COWS Total Score: 0  Musculoskeletal: Strength & Muscle Tone: within normal limits Gait & Station: normal Patient leans: N/A  Psychiatric Specialty Exam: Review of Systems  Constitutional: Positive for malaise/fatigue.  Gastrointestinal: Positive for abdominal pain.  Psychiatric/Behavioral: Positive for depression and substance abuse. The patient is nervous/anxious and has insomnia.   All other systems reviewed and are negative.   Blood pressure 139/81, pulse 74, temperature 98.8 F (37.1 C), temperature source Oral, resp. rate 16, height 5\' 4"  (1.626 m), weight 108.863 kg (240 lb).Body mass index is 41.18 kg/(m^2).  General Appearance: Casual  Eye Contact::  Fair  Speech:  Slow  Volume:  Decreased  Mood:  Anxious, Dysphoric and Irritable improving, is vaguely irritable today  Affect:  Appropriate   Thought Process:  Linear   Orientation:  Full (Time, Place, and Person)  Thought Content:  Paranoid Ideation and Rumination  responding to internal stimuli on and off yet improving.  Suicidal Thoughts:  No  Homicidal Thoughts:  No  Memory:  Immediate;   Fair Recent;   Poor Remote;   Poor  Judgement:  Impaired  Insight:  Shallow  Psychomotor Activity:  Normal  Concentration:  Fair  Recall:  [place]: Fair  Akathisia:  No  Handed:  Right  AIMS (if indicated):     Assets:  Others:  access to healthcare  ADL's:  Intact  Cognition: WNL  Sleep:  Number of  Hours: 3   Treatment Plan Summary:Pt is a 36 y old AAF , who has a hx of schizoaffective do, presented with psychosis. Pt continues to struggle with sleep issues. Pt continues to refuse mood stabilizers. Pt wants to take only Trilafon. Will readjust sleep medication tonight .Pt will need continued treatment and stabilization on the inpatient unit.      Daily contact with patient to assess and evaluate symptoms and progress in treatment and Medication management  Reviewed past medical records,treatment plan. Continue Trilafon 8 mg po daily and 12 mg po qpm  for psychosis. Will continue Amantadine 100 mg po bid for postural tremors. Will make available PRN medications as per agitation protocol. Will increase Trazodone to 175 mg po qhs prn for sleep. Symptomatic treatment for abdominal cramps. Will continue to monitor vitals ,medication compliance and treatment side effects while patient is here.  Will monitor for medical issues as well as call consult as needed.  Offer nicotine patch for smoking cessation. Reviewed labs , orderered TSH, PL , repeat BMP .Pt refused all labs . CSW will start working on disposition.Will need to expand collateral information.  Patient to participate in therapeutic milieu .   Rifka Ramey, MD 05/19/2015, 12;02 pm

## 2015-05-19 NOTE — Plan of Care (Signed)
Problem: Consults Goal: Depression Patient Education See Patient Education Module for education specifics.  Outcome: Progressing Nurse discussed depression/coping skills with patient.        

## 2015-05-19 NOTE — Progress Notes (Signed)
D: Pt denies SI/HI/AVH. Pt is pleasant and cooperative. Pt denies AVH, but appears to be responding at times. Pt visibly seen on the unit . Pt appears to be happier when interacting with staff and peers.   A: Pt was offered support and encouragement. Pt was given scheduled medications. Pt was encourage to attend groups. Q 15 minute checks were done for safety.   R: Pt is taking medication. Pt has no complaints at this time.Pt receptive to treatment and safety maintained on unit.

## 2015-05-19 NOTE — Progress Notes (Signed)
Adult Psychoeducational Group Note  Date:  05/19/2015 Time:  9:18 PM  Group Topic/Focus:  Wrap-Up Group:   The focus of this group is to help patients review their daily goal of treatment and discuss progress on daily workbooks.  Participation Level:  Did Not Attend  Participation Quality:  Did not attend  Affect:  Did not attend  Cognitive:  Did not attend  Insight: None  Engagement in Group:  Did not attend  Modes of Intervention:  Did not attend  Additional Comments:  Patient did not attend wrap up group tonight.   Lucette Kratz L Melecio Cueto 05/19/2015, 9:18 PM

## 2015-05-19 NOTE — Progress Notes (Signed)
D:  Patient has stayed in bed most of day.  Refused vital signs earlier this morning.  Nurse went to room and took VS.  Patient would not raise her arm off the bed for BP cuff.  Patient laughed when asked to lift her arm.  Patient refused morning medications.   Later patient got out of bed and came to med window for her morning meds.  Asked RN if she was the one who came to her room to take her VS and then laughed at nurse.  Patient has not filled out self inventory as of this time. A:  Medications administered per MD orders.  Emotional support and encouragement given patient. R:  Safety maintained with 15 minute checks.

## 2015-05-20 MED ORDER — TRAZODONE HCL 150 MG PO TABS: 175.0000 mg | ORAL_TABLET | Freq: Every day | ORAL | Status: DC

## 2015-05-20 MED ORDER — NICOTINE POLACRILEX 2 MG MT GUM: 2.0000 mg | CHEWING_GUM | OROMUCOSAL | Status: DC | PRN

## 2015-05-20 MED ORDER — VITAMIN B-6 100 MG PO TABS: 100.0000 mg | ORAL_TABLET | Freq: Every day | ORAL | Status: DC

## 2015-05-20 MED ORDER — AMANTADINE HCL 100 MG PO CAPS: 100.0000 mg | ORAL_CAPSULE | Freq: Two times a day (BID) | ORAL | Status: DC

## 2015-05-20 MED ORDER — PERPHENAZINE 4 MG PO TABS: ORAL_TABLET | ORAL | Status: DC

## 2015-05-20 NOTE — Progress Notes (Signed)
Discharge Note:  Patient was picked up by ACT Team nurse.  Patient denied SI and HI.  Denied A/V hallucinations.  Denied pain.  Patient stated she received all her belongings, coat, keys, misc items, toiletries, prescriptions, etc.  Suicide prevention information given and discussed with patient who stated she understood and had no questions.  Patient stated she appreciated all assistance received from [facility] staff.  Patient received all her discharge information.  Patient wanted to leave her coat, stated she did not need her coat, but coat was given to ACT Team nurse who stated patient needed her coat.

## 2015-05-20 NOTE — BHH Suicide Risk Assessment (Signed)
[place] INPATIENT:  Family/Significant Other Suicide Prevention Education  Suicide Prevention Education:  Patient Refusal for Family/Significant Other Suicide Prevention Education: The patient [name] has refused to provide written consent for family/significant other to be provided Family/Significant Other Suicide Prevention Education during admission and/or prior to discharge.  Physician notified. Pt has no one.  She gave me mother's number in [facility], from whom she is estranged and has no contact.   Will return home with support of ACT team.  Trish Mage 05/20/2015, 8:41 AM

## 2015-05-20 NOTE — BHH Suicide Risk Assessment (Signed)
[facility] Discharge Suicide Risk Assessment   Principal Problem: Schizoaffective disorder, bipolar type [facility]) Discharge Diagnoses:  Patient Active Problem List   Diagnosis Date Noted  . Tobacco use disorder [F17.200] 05/12/2015  . Cannabis use disorder, moderate, dependence ([place]) [F12.20] 05/12/2015  . Hypertension [I10] 02/24/2011  . ANEMIA-IRON DEFICIENCY [D50.9] 12/13/2006  . GERD [K21.9] 12/13/2006  . OBESITY [E66.9] 12/12/2006  . Schizoaffective disorder, bipolar type ([place]) [F25.0] 12/12/2006  . HEMORRHOIDS, NOS [K64.9] 12/12/2006  . PAIN-NECK [M54.2] 12/12/2006  . TREMOR [R25.9] 12/12/2006    Total Time spent with patient: 30 minutes  Musculoskeletal: Strength & Muscle Tone: within normal limits Gait & Station: normal Patient leans: N/A  Psychiatric Specialty Exam: Review of Systems  Psychiatric/Behavioral: Negative for depression, suicidal ideas, hallucinations and substance abuse. The patient is not nervous/anxious.   All other systems reviewed and are negative.   Blood pressure 130/66, pulse 68, temperature 98.4 F (36.9 C), temperature source Oral, resp. rate 16, height 5\' 4"  (1.626 m), weight 108.863 kg (240 lb).Body mass index is 41.18 kg/(m^2).  General Appearance: Casual  Eye Contact::  Fair  Speech:  Clear and [id number]  Volume:  Normal  Mood:  Euthymic  Affect:  Appropriate  Thought Process:  Coherent  Orientation:  Full (Time, Place, and Person)  Thought Content:  WDL  Suicidal Thoughts:  No  Homicidal Thoughts:  No  Memory:  Immediate;   Fair Recent;   Fair Remote;   Fair  Judgement:  Fair  Insight:  Fair  Psychomotor Activity:  Normal  Concentration:  Fair  Recall:  [employer] of Knowledge:Fair  Language: Fair  Akathisia:  No  Handed:  Right  AIMS (if indicated):     Assets:  Desire for Improvement  Sleep:  Number of Hours: 5.25  Cognition: WNL  ADL's:  Intact   Mental Status Per Nursing Assessment::   On Admission:  NA  Demographic  Factors:  female, single  Loss Factors: NA  Historical Factors: Impulsivity  Risk Reduction Factors:   Positive social support  Continued Clinical Symptoms:  Previous Psychiatric Diagnoses and Treatments  Cognitive Features That Contribute To Risk:  Polarized thinking    Suicide Risk:  Minimal: No identifiable suicidal ideation.  Patients presenting with no risk factors but with morbid ruminations; may be classified as minimal risk based on the severity of the depressive symptoms  Follow-up Information    Follow up with [facility] ACT team.   Why:  Someone from the team will pick you up at 11:30 the day of d/c   Contact information:   [place] recommendations:  Activity:  no restrictions Diet:  regular Tests:  as needed Other:  none  Tripp Goins, MD 05/20/2015, 9:42 AM

## 2015-05-20 NOTE — Discharge Summary (Signed)
Physician Discharge Summary Note  Patient:  [name] is an 45 y.o., female MRN:  [id number] DOB:  [birth year] Patient phone:  [phone] (home)  Patient address:   [address] [place] [address],  Total Time spent with patient: Greater ther than 30 minutes  Date of Admission:  05/10/2015  Date of Discharge: 05-20-15  Reason for Admission:  Worsening symptoms of Schizoaffective disorder  Principal Problem: Schizoaffective disorder, bipolar type [facility]) Discharge Diagnoses: Patient Active Problem List   Diagnosis Date Noted  . Tobacco use disorder [F17.200] 05/12/2015  . Cannabis use disorder, moderate, dependence ([place]) [F12.20] 05/12/2015  . Hypertension [I10] 02/24/2011  . ANEMIA-IRON DEFICIENCY [D50.9] 12/13/2006  . GERD [K21.9] 12/13/2006  . OBESITY [E66.9] 12/12/2006  . Schizoaffective disorder, bipolar type ([place]) [F25.0] 12/12/2006  . HEMORRHOIDS, NOS [K64.9] 12/12/2006  . PAIN-NECK [M54.2] 12/12/2006  . TREMOR [R25.9] 12/12/2006   Past Psychiatric History: Schizoaffective disorder, bipolar-type  Past Medical History:  Past Medical History  Diagnosis Date  . Depression   . Bipolar 1 disorder ([place])   . Hemorrhoid   . Transfusion history   . Schizophrenia ([place])     Pt denies and reports it is schizoaffective disorder.   . Arthritis   . Bronchitis   . Schizoaffective disorder [facility])     Past Surgical History  Procedure Laterality Date  . Cesarean section     Family History:  Family History  Problem Relation Age of Onset  . Diabetes type II Other    Family Psychiatric  History: See H&P  Social History:  History  Alcohol Use No     History  Drug Use  . Yes  . Special: Marijuana    Comment: Last used: yesterday.     Social History   Social History  . Marital Status: Legally Separated    Spouse Name: N/A  . Number of Children: N/A  . Years of Education: N/A   Social History Main Topics  . Smoking status: Current Every Day  Smoker -- 0.50 packs/day for 17 years    Types: Cigarettes  . Smokeless tobacco: Never Used  . Alcohol Use: No  . Drug Use: Yes    Special: Marijuana     Comment: Last used: yesterday.   Marland Kitchen Sexual Activity: Not Asked   Other Topics Concern  . None   Social History Narrative   ** Merged History Encounter **       Hospital Course: Lucina Krynski is an 45 y.o. female. Patient is a poor historian. Patient reports living alone but participating with [facility] team for mental health services. This writer has left a message for the patient's ACTT crisis line to return a call for collateral information. Patient reports she is here because some is wrong at her home although no one is there with her then she refused to talk the rest of the the interview. Patient denies SI/HI, A/VH, and other self-injurious behaviors. Patient reports her psychiatrist stopped her medications 2 days ago because it was causing her auditory hallucinations and now she is not feeling herself. Patient reports poor sleep.  Although presented on admission as a poor historian, it was quite obvious that [facility] needed mood stabilization treatments to re-stabilize her worsening symptoms of Schizoaffective disorder. She was presenting with worsening symptoms of schizoaffective disorder. She already has an ACT team who are over seeing the treatment for her mental health. During the course of her hospitalization; [name] received medication management for mood control. She  was medicated & discharged on; Perphenazine 4 mg for mood control, Amantadine 100 mg for prevention of drug induced tremors & Trazodone 150 mg for insomnia. She also received other medication regimen for the other medical issues presented. She tolerated her treatment regimen without any adverse effects.  Besides the mood stabilization treatments, [name] was also enrolled & participated in the group counseling sessions being offered & held on this unit. She  learned coping skills that should help her cope better after discharge to maintain mood stability after discharge. [name]'s symptoms responded well to her treatment regimen. This is evidenced by her reports of improved mood, presentation of good affects & absence of psychotic symptoms. She is currently being discharged to continue psychiatric care on an outpatient basis with her present ACT Team.  Upon discharge, [name] denies any SIHI, AVH, delusional thoughts & or paranoia. She left [facility] with all personal belongings in no apparent distress. Transportation per her ACT Team.      Physical Findings: AIMS: Facial and Oral Movements Muscles of Facial Expression: None, normal Lips and Perioral Area: None, normal Jaw: None, normal Tongue: None, normal,Extremity Movements Upper (arms, wrists, hands, fingers): None, normal Lower (legs, knees, ankles, toes): None, normal, Trunk Movements Neck, shoulders, hips: None, normal, Overall Severity Severity of abnormal movements (highest score from questions above): None, normal Incapacitation due to abnormal movements: None, normal Patient's awareness of abnormal movements (rate only patient's report): No Awareness, Dental Status Current problems with teeth and/or dentures?: No Does patient usually wear dentures?: No  CIWA:  CIWA-Ar Total: 1 COWS:  COWS Total Score: 1  Musculoskeletal: Strength & Muscle Tone: within normal limits Gait & Station: normal Patient leans: N/A  Psychiatric Specialty Exam: Review of Systems  Constitutional: Negative.   HENT: Negative.   Eyes: Negative.   Respiratory: Negative.   Cardiovascular: Negative.   Gastrointestinal: Negative.   Genitourinary: Negative.   Musculoskeletal: Negative.   Skin: Negative.   Neurological: Negative.   Endo/Heme/Allergies: Negative.   Psychiatric/Behavioral: Positive for depression (Stable) and substance abuse (Cannabis & Tobacco dependence). Negative for suicidal ideas,  hallucinations and memory loss. The patient has insomnia (Stable). The patient is not nervous/anxious.     Blood pressure 130/68, pulse 68, temperature 98.4 F (36.9 C), temperature source Oral, resp. rate 16, height 5\' 4"  (1.626 m), weight 108.863 kg (240 lb).Body mass index is 41.18 kg/(m^2).  See Md's SRA   Has this patient used any form of tobacco in the last 30 days? (Cigarettes, Smokeless Tobacco, Cigars, and/or Pipes) Yes, Yes, A prescription for an FDA-approved tobacco cessation medication was offered at discharge and the patient refused  Metabolic Disorder Labs:  Lab Results  Component Value Date   HGBA1C 6.1* 05/11/2015   MPG 128 05/11/2015   No results found for: PROLACTIN Lab Results  Component Value Date   CHOL 151 05/11/2015   TRIG 93 05/11/2015   HDL 35* 05/11/2015   CHOLHDL 4.3 05/11/2015   VLDL 19 05/11/2015   LDLCALC 97 05/11/2015    See Psychiatric Specialty Exam and Suicide Risk Assessment completed by Attending Physician prior to discharge.  Discharge destination:  Home  Is patient on multiple antipsychotic therapies at discharge:  No   Has Patient had three or more failed trials of antipsychotic monotherapy by history:  No  Recommended Plan for Multiple Antipsychotic Therapies: NA    Medication List    STOP taking these medications        albuterol 108 (90 Base) MCG/ACT inhaler  Commonly  known as:  PROVENTIL HFA;VENTOLIN HFA     azithromycin 250 MG tablet  Commonly known as:  ZITHROMAX     chlorproMAZINE 100 MG tablet  Commonly known as:  THORAZINE     ferrous sulfate 325 (65 FE) MG EC tablet     multivitamin with minerals Tabs tablet      TAKE these medications      Indication   amantadine 100 MG capsule  Commonly known as:  SYMMETREL  Take 1 capsule (100 mg total) by mouth 2 (two) times daily. For prevention of drug induced tremors   Indication:  Extrapyramidal Reaction caused by Medications     nicotine polacrilex 2 MG gum   Commonly known as:  NICORETTE  Take 1 each (2 mg total) by mouth as needed for smoking cessation.   Indication:  Nicotine Addiction     perphenazine 4 MG tablet  Commonly known as:  TRILAFON  Take 2 tablets (8 mg) in the mornings & 3 tablets (12 mg) at bedtime: For mood control   Indication:  Mood control     pyridOXINE 100 MG tablet  Commonly known as:  VITAMIN B-6  Take 1 tablet (100 mg total) by mouth daily. For low B-6 Vitamin   Indication:  Lack of Vitamin B6     traZODone 150 MG tablet  Commonly known as:  DESYREL  Take 1 tablet (150 mg total) by mouth at bedtime. For insomnia   Indication:  Trouble Sleeping       Follow-up Information    Follow up with [facility] ACT team.   Why:  Someone from the team will pick you up at 11:30 the day of d/c   Contact information:   [place] [phone]     Follow-up recommendations: Activity:  As tolerated Diet: As recommended by your primary care doctor. Keep all scheduled follow-up appointments as recommended.    Comments: Take all your medications as prescribed by your mental healthcare provider. Report any adverse effects and or reactions from your medicines to your outpatient provider promptly. Patient is instructed and cautioned to not engage in alcohol and or illegal drug use while on prescription medicines. In the event of worsening symptoms, patient is instructed to call the crisis hotline, 911 and or go to the nearest ED for appropriate evaluation and treatment of symptoms. Follow-up with your primary care provider for your other medical issues, concerns and or health care needs.   Signed: Encarnacion Slates, PMHNP, FNP-BC 05/21/2015, 9:46 AM

## 2015-05-20 NOTE — Progress Notes (Signed)
  [facility] Adult Case Management Discharge Plan :  Will you be returning to the same living situation after discharge:  Yes,  home At discharge, do you have transportation home?: Yes,  ACT team Do you have the ability to pay for your medications: Yes,  MCD  Release of information consent forms completed and in the chart;  Patient's signature needed at discharge.  Patient to Follow up at: Follow-up Information    Follow up with [facility] team.   Why:  Someone from the team will pick you up at 11:30 the day of d/c   Contact information:   [address]  [place] [[id number]] [phone]      Next level of care provider has access to [place] and Suicide Prevention discussed: Yes,  yes     Has patient been referred to the Quitline?: Patient refused referral  Patient has been referred for addiction treatment: N/A  Trish Mage 05/20/2015, 8:44 AM

## 2015-05-20 NOTE — Tx Team (Signed)
Interdisciplinary Treatment Plan Update (Adult)  Date:  05/20/2015  Time Reviewed:  8:38 AM   Progress in Treatment: Attending groups: No. Participating in groups:  No. Taking medication as prescribed:  Yes. Tolerating medication:  Yes. Family/Significant othe contact made: Yes Patient understands diagnosis:  No. Pt is poor historian, lacks insight.Discussing patient identified problems/goals with staff:  Yes. Medical problems stabilized or resolved:  Yes. Denies suicidal/homicidal ideation: Yes. Issues/concerns per patient self-inventory:  Other:  Discharge Plan or Barriers: see below Reason for Continuation of Hospitalization:   Comments:  [name] is an 45 y.o. female. Patient is a poor historian. Patient reports living alone but participating with [facility] team for mental health services. Message left for [facility] by both CSW and intake assessor requesting a call back for collateral information. Patient reports she is here because some is wrong at her home although no one is there but refused to elaborate during initial assessment. Patient denies SI/HI, VH, and other self-injurious behaviors. Patient reports her psychiatrist stopped her medications 2 days ago because it was causing her auditory hallucinations and now she is not feeling herself. Patient reports poor sleep.   Estimated length of stay:  D/C today  New goal(s): to gain collateral information and confirm appropriate d/c plan with [facility] ACT and patient.   Additional Comments:  05/15/15: Pt continues to be labile and delusional. Pt will need continued treatment and stabilization on the inpatient unit. Will increase Trilafon 8 mg po daily and 12 mg po qpm for psychosis. Added Amantadine 100 mg po bid for postural tremors. Will make available PRN medications as per agitation protocol. Will increase Trazodone to 100 mg po qhs prn for sleep. Pt at this time will not take any other medications.  Review of  initial/current patient goals per problem list:  1. Goal(s): Patient will participate in aftercare plan  Met: Yes  Target date: at discharge  As evidenced by: Patient will participate within aftercare plan AEB aftercare provider and housing plan at discharge being identified.  1/23: Pt continues to refuse to complete PSA. Message left for [facility] requesting call back for collateral information and to confirm d/c plan.  05/15/15:  Return home, follow up ACT team  2. Goal (s): Patient will exhibit decreased depressive symptoms and suicidal ideations.  Met: Yes   Target date: at discharge  As evidenced by: Patient will utilize self rating of depression at 3 or below and demonstrate decreased signs of depression or be deemed stable for discharge by MD.  1/23:  Pt presents with depressed/agitated mood and flat affect. She continues to refuse to speak/answer questions.  05/15/15:  Denies depression today  3. Goal(s): Patient will demonstrate decreased signs and symptoms of psychosis.   Met:Yes  Target date: at discharge  As evidenced by: Patient will utilize self rating of anxiety at 3 or below and demonstrated decreased signs of anxiety, or be deemed stable for discharge by MD  1/23: Pt denies AVH but continues to refuse to participate in assessment/possibly delusional and paranoid.  05/15/15:  Delusions and mood lability persist. 05/20/15:  Delusions persist.  Mood stable.  Pt at baseline  4. Goal(s): Patient will demonstrate decreased signs of withdrawal due to substance abuse  Met:Yes  Target date:at discharge   As evidenced by: Patient will produce a CIWA/COWS score of 0, have stable vitals signs, and no symptoms of withdrawal.  1/23: Pt positive for THC only. No withdrawal protocol and pt has stable vitals.  Attendees: Patient:   05/20/2015 8:38 AM   Family:   05/20/2015 8:38 AM   Physician:  Ursula Alert  05/20/2015 8:38 AM   Nursing:    05/20/2015 8:38 AM    Clinical Social Worker:  05/20/2015 8:38 AM   Clinical Social Worker: Roque Lias, [place]  05/20/2015 8:38 AM   Other:  Gerline Legacy Nurse Case Manager 05/20/2015 8:38 AM   Other:   05/20/2015 8:38 AM   Other:   05/20/2015 8:38 AM   Other:  05/20/2015 8:38 AM   Other:  05/20/2015 8:38 AM   Other:  05/20/2015 8:38 AM    05/20/2015 8:38 AM    05/20/2015 8:38 AM    05/20/2015 8:38 AM    05/20/2015 8:38 AM    Scribe for Treatment Team:   Maxie Better, LCSW 05/20/2015 8:38 AM

## 2015-05-20 NOTE — Progress Notes (Signed)
Recreation Therapy Notes  01.31.2017 LRT again attempted to meet with patient. Patient declined LRT, stating she is anticipating d/c today and does not need any additional services.   Laureen Ochs Posey Jasmin, LRT/CTRS  Lane Hacker 05/20/2015 12:52 PM

## 2015-05-20 NOTE — Progress Notes (Signed)
D:  Patient denied SI and HI, contracts for safety.  Denied A/V hallucinations.  Denied pain. A:  Medications administered per MD orders.  Emotional support and encouragement given patient. R:  Safety maintained with 15 minute checks.  

## 2015-05-20 NOTE — BHH Group Notes (Signed)
The focus of this group is to educate the patient on the purpose and policies of crisis stabilization and provide a format to answer questions about their admission.  The group details unit policies and expectations of patients while admitted.  Patient attended half of the 0900 nurse education orientation group this morning.  Patient listened attentively and had appropriate affect.  Patient was alert.  Patient had appropriate insight and appropriate engagement.  Today patient will work on 3 goals for discharge.

## 2015-05-28 ENCOUNTER — Encounter (HOSPITAL_COMMUNITY): Payer: Self-pay | Admitting: Family Medicine

## 2015-05-28 ENCOUNTER — Emergency Department (HOSPITAL_COMMUNITY)
Admission: EM | Admit: 2015-05-28 | Discharge: 2015-05-30 | Disposition: A | Discharge status: Home / Self Care | Payer: Medicaid Other | Attending: Emergency Medicine | Admitting: Emergency Medicine

## 2015-05-28 DIAGNOSIS — Z8739 Personal history of other diseases of the musculoskeletal system and connective tissue: Secondary | ICD-10-CM | POA: Diagnosis not present

## 2015-05-28 DIAGNOSIS — F1220 Cannabis dependence, uncomplicated: Secondary | ICD-10-CM | POA: Diagnosis present

## 2015-05-28 DIAGNOSIS — R4589 Other symptoms and signs involving emotional state: Secondary | ICD-10-CM

## 2015-05-28 DIAGNOSIS — Z3202 Encounter for pregnancy test, result negative: Secondary | ICD-10-CM | POA: Insufficient documentation

## 2015-05-28 DIAGNOSIS — F39 Unspecified mood [affective] disorder: Secondary | ICD-10-CM | POA: Diagnosis not present

## 2015-05-28 DIAGNOSIS — F250 Schizoaffective disorder, bipolar type: Secondary | ICD-10-CM | POA: Diagnosis not present

## 2015-05-28 DIAGNOSIS — Z8709 Personal history of other diseases of the respiratory system: Secondary | ICD-10-CM | POA: Diagnosis not present

## 2015-05-28 DIAGNOSIS — Z8719 Personal history of other diseases of the digestive system: Secondary | ICD-10-CM | POA: Diagnosis not present

## 2015-05-28 DIAGNOSIS — F17210 Nicotine dependence, cigarettes, uncomplicated: Secondary | ICD-10-CM | POA: Diagnosis not present

## 2015-05-28 DIAGNOSIS — Z79899 Other long term (current) drug therapy: Secondary | ICD-10-CM | POA: Insufficient documentation

## 2015-05-28 DIAGNOSIS — F22 Delusional disorders: Secondary | ICD-10-CM | POA: Diagnosis present

## 2015-05-28 DIAGNOSIS — E669 Obesity, unspecified: Secondary | ICD-10-CM | POA: Diagnosis not present

## 2015-05-28 LAB — URINALYSIS, ROUTINE W REFLEX MICROSCOPIC
Glucose, UA: NEGATIVE mg/dL
HGB URINE DIPSTICK: NEGATIVE
Ketones, ur: 15 mg/dL — AB
LEUKOCYTES UA: NEGATIVE
Nitrite: NEGATIVE
Protein, ur: 100 mg/dL — AB
SPECIFIC GRAVITY, URINE: 1.036 — AB (ref 1.005–1.030)
pH: 6 (ref 5.0–8.0)

## 2015-05-28 LAB — CBC WITH DIFFERENTIAL/PLATELET
BASOS ABS: 0.1 10*3/uL (ref 0.0–0.1)
Basophils Relative: 0 %
Eosinophils Absolute: 0 10*3/uL (ref 0.0–0.7)
Eosinophils Relative: 0 %
HEMATOCRIT: 31.3 % — AB (ref 36.0–46.0)
Hemoglobin: 9.5 g/dL — ABNORMAL LOW (ref 12.0–15.0)
LYMPHS PCT: 29 %
Lymphs Abs: 3.4 10*3/uL (ref 0.7–4.0)
MCH: 23.8 pg — ABNORMAL LOW (ref 26.0–34.0)
MCHC: 30.4 g/dL (ref 30.0–36.0)
MCV: 78.3 fL (ref 78.0–100.0)
MONO ABS: 0.7 10*3/uL (ref 0.1–1.0)
MONOS PCT: 6 %
Neutro Abs: 7.2 10*3/uL (ref 1.7–7.7)
Neutrophils Relative %: 63 %
Platelets: 492 10*3/uL — ABNORMAL HIGH (ref 150–400)
RBC: 4 MIL/uL (ref 3.87–5.11)
RDW: 17.8 % — AB (ref 11.5–15.5)
WBC: 11.4 10*3/uL — AB (ref 4.0–10.5)

## 2015-05-28 LAB — I-STAT BETA HCG BLOOD, ED (MC, WL, AP ONLY): I-stat hCG, quantitative: <5 m[IU]/mL (ref <5)

## 2015-05-28 LAB — URINE MICROSCOPIC-ADD ON

## 2015-05-28 LAB — RAPID URINE DRUG SCREEN, HOSP PERFORMED
AMPHETAMINES: NOT DETECTED
BENZODIAZEPINES: NOT DETECTED
Barbiturates: NOT DETECTED
COCAINE: NOT DETECTED
OPIATES: NOT DETECTED
TETRAHYDROCANNABINOL: POSITIVE — AB

## 2015-05-28 LAB — COMPREHENSIVE METABOLIC PANEL
ALBUMIN: 4.3 g/dL (ref 3.5–5.0)
ALT: 19 U/L (ref 14–54)
ANION GAP: 11 (ref 5–15)
AST: 29 U/L (ref 15–41)
Alkaline Phosphatase: 71 U/L (ref 38–126)
BILIRUBIN TOTAL: 0.2 mg/dL — AB (ref 0.3–1.2)
BUN: 11 mg/dL (ref 6–20)
CALCIUM: 9.3 mg/dL (ref 8.9–10.3)
CO2: 27 mmol/L (ref 22–32)
Chloride: 99 mmol/L — ABNORMAL LOW (ref 101–111)
Creatinine, Ser: 0.95 mg/dL (ref 0.44–1.00)
Glucose, Bld: 111 mg/dL — ABNORMAL HIGH (ref 65–99)
POTASSIUM: 2.8 mmol/L — AB (ref 3.5–5.1)
Sodium: 137 mmol/L (ref 135–145)
TOTAL PROTEIN: 8.8 g/dL — AB (ref 6.5–8.1)

## 2015-05-28 LAB — SALICYLATE LEVEL

## 2015-05-28 LAB — LIPASE, BLOOD: LIPASE: 20 U/L (ref 11–51)

## 2015-05-28 MED ORDER — PERPHENAZINE 4 MG PO TABS
4.0000 mg | ORAL_TABLET | Freq: Every day | ORAL | Status: DC
  Filled 2015-05-28 (×2): qty 1

## 2015-05-28 MED ORDER — NICOTINE POLACRILEX 2 MG MT GUM: 2.0000 mg | CHEWING_GUM | OROMUCOSAL | Status: DC | PRN

## 2015-05-28 MED ORDER — TRAZODONE HCL 50 MG PO TABS
175.0000 mg | ORAL_TABLET | Freq: Every day | ORAL | Status: DC
  Filled 2015-05-28: qty 1

## 2015-05-28 MED ORDER — POTASSIUM CHLORIDE CRYS ER 20 MEQ PO TBCR
40.0000 meq | EXTENDED_RELEASE_TABLET | Freq: Once | ORAL | Status: DC
  Filled 2015-05-28: qty 2

## 2015-05-28 MED ORDER — AMANTADINE HCL 100 MG PO CAPS
100.0000 mg | ORAL_CAPSULE | Freq: Two times a day (BID) | ORAL | Status: DC
  Administered 2015-05-30: 100 mg via ORAL
  Filled 2015-05-28 (×5): qty 1

## 2015-05-28 NOTE — ED Notes (Signed)
Patient is from home and transported by [facility]. Pt is reporting to EMS she does not feel right. EMS reports when the patient is asked a question, she looks around and stares of in the distance before responding. Alert, oriented x 4 and able to answer questions appropriately.

## 2015-05-28 NOTE — BH Assessment (Signed)
Assessment completed. Consulted Patriciaann Clan, PA-C who recommended an AM psych eval. Informed Comer Locket, PA-C of the recommendation.

## 2015-05-28 NOTE — ED Notes (Signed)
Phlebotomy at bedside.

## 2015-05-28 NOTE — ED Notes (Signed)
Bed: [id number] Expected date:  Expected time:  Means of arrival:  Comments: EMS- UTI, fever, hyperglycemia

## 2015-05-28 NOTE — ED Provider Notes (Signed)
CSN: [id number]     Arrival date & time 05/28/15  1757 History   First MD Initiated Contact with Patient 05/28/15 1803     Chief Complaint  Patient presents with  . Paranoid     (Consider location/radiation/quality/duration/timing/severity/associated sxs/prior Treatment) HPI [name] is a 45 y.o. female history of depression, bipolar 1 disorder, schizoaffective disorder, comes in for evaluation of not feeling well. Patient recently admitted by psychiatry 01/21 and discharged on 1/31, medications prescribed at that visit include Perphenazine 4 mg for mood control, Amantadine 100 mg for prevention of drug induced tremors. Patient does not cooperate with interview and is very withdrawn. She does not answer if she is taking her medications. She states that she does not feel well, but denies any fever, chest pain, problems breathing, abdominal pain, difficulties going to the bathroom.  Past Medical History  Diagnosis Date  . Depression   . Bipolar 1 disorder ([place])   . Hemorrhoid   . Transfusion history   . Schizophrenia ([place])     Pt denies and reports it is schizoaffective disorder.   . Arthritis   . Bronchitis   . Schizoaffective disorder [facility])    Past Surgical History  Procedure Laterality Date  . Cesarean section     Family History  Problem Relation Age of Onset  . Diabetes type II Other    Social History  Substance Use Topics  . Smoking status: Current Every Day Smoker -- 0.50 packs/day for 17 years    Types: Cigarettes  . Smokeless tobacco: Never Used  . Alcohol Use: No   OB History    No data available     Review of Systems  Unable to perform ROS: Other      Allergies  Latuda; Flagyl; and Sulfa antibiotics  Home Medications   Prior to Admission medications   Medication Sig Start Date End Date Taking? Authorizing Provider  amantadine (SYMMETREL) 100 MG capsule Take 1 capsule (100 mg total) by mouth 2 (two) times daily. For prevention of drug induced  tremors 05/20/15  Yes Encarnacion Slates, NP  nicotine polacrilex (NICORETTE) 2 MG gum Take 1 each (2 mg total) by mouth as needed for smoking cessation. 05/20/15  Yes Encarnacion Slates, NP  perphenazine (TRILAFON) 4 MG tablet Take 2 tablets (8 mg) in the mornings & 3 tablets (12 mg) at bedtime: For mood control 05/20/15  Yes Encarnacion Slates, NP  pyridOXINE (VITAMIN B-6) 100 MG tablet Take 1 tablet (100 mg total) by mouth daily. For low B-6 Vitamin 05/20/15  Yes Encarnacion Slates, NP  traZODone (DESYREL) 150 MG tablet Take 1 tablet (150 mg total) by mouth at bedtime. For insomnia 05/20/15  Yes Encarnacion Slates, NP   BP 143/86 mmHg  Pulse 87  Temp(Src) 99.4 F (37.4 C) (Oral)  Resp 18  Ht 5\' 4"  (1.626 m)  Wt 108.863 kg  BMI 41.18 kg/m2  SpO2 100%  LMP  Physical Exam  Constitutional: She is oriented to person, place, and time. She appears well-developed and well-nourished.  Obese, African-American female.  HENT:  Head: Normocephalic and atraumatic.  Mouth/Throat: Oropharynx is clear and moist.  Eyes: Conjunctivae are normal. Pupils are equal, round, and reactive to light. Right eye exhibits no discharge. Left eye exhibits no discharge. No scleral icterus.  Neck: Neck supple.  Cardiovascular: Normal rate, regular rhythm and normal heart sounds.   Pulmonary/Chest: Effort normal and breath sounds normal. No respiratory distress. She has no wheezes. She has  no rales.  Abdominal: Soft. There is no tenderness.  Musculoskeletal: She exhibits no tenderness.  Neurological: She is alert and oriented to person, place, and time.  Cranial Nerves II-XII grossly intact  Skin: Skin is warm and dry. No rash noted.  Psychiatric: Her affect is blunt. Her speech is delayed. She is withdrawn.  Nursing note and vitals reviewed.   ED Course  Procedures (including critical care time) Labs Review Labs Reviewed  CBC WITH DIFFERENTIAL/PLATELET - Abnormal; Notable for the following:    WBC 11.4 (*)    Hemoglobin 9.5 (*)    HCT  31.3 (*)    MCH 23.8 (*)    RDW 17.8 (*)    Platelets 492 (*)    All other components within normal limits  URINALYSIS, ROUTINE W REFLEX MICROSCOPIC (NOT AT [facility]) - Abnormal; Notable for the following:    Color, Urine AMBER (*)    APPearance CLOUDY (*)    Specific Gravity, Urine 1.036 (*)    Bilirubin Urine SMALL (*)    Ketones, ur 15 (*)    Protein, ur 100 (*)    All other components within normal limits  URINE RAPID DRUG SCREEN, HOSP PERFORMED - Abnormal; Notable for the following:    Tetrahydrocannabinol POSITIVE (*)    All other components within normal limits  COMPREHENSIVE METABOLIC PANEL - Abnormal; Notable for the following:    Potassium 2.8 (*)    Chloride 99 (*)    Glucose, Bld 111 (*)    Total Protein 8.8 (*)    Total Bilirubin 0.2 (*)    All other components within normal limits  URINE MICROSCOPIC-ADD ON - Abnormal; Notable for the following:    Squamous Epithelial / LPF 6-30 (*)    Bacteria, UA FEW (*)    All other components within normal limits  SALICYLATE LEVEL  LIPASE, BLOOD  I-STAT BETA HCG BLOOD, ED ([facility], [facility], [facility] ONLY)    Imaging Review No results found. I have personally reviewed and evaluated these images and lab results as part of my medical decision-making.   EKG Interpretation None     Meds given in ED:  Medications  potassium chloride SA (K-DUR,KLOR-CON) CR tablet 40 mEq (40 mEq Oral Refused 05/28/15 2013)  amantadine (SYMMETREL) capsule 100 mg (100 mg Oral Not Given 05/28/15 2246)  nicotine polacrilex (NICORETTE) gum 2 mg (not administered)  perphenazine (TRILAFON) tablet 4 mg (4 mg Oral Not Given 05/28/15 2246)  traZODone (DESYREL) tablet 175 mg (175 mg Oral Not Given 05/28/15 2247)    New Prescriptions   No medications on file   Filed Vitals:   05/28/15 1810 05/28/15 1933  BP: 142/90 143/86  Pulse: 93 87  Temp: 99.1 F (37.3 C) 99.4 F (37.4 C)  TempSrc: Oral Oral  Resp: 20 18  Height: 5\' 4"  (1.626 m)   Weight: 108.863 kg   SpO2: 99%  100%    MDM  [name] is a 45 y.o. female with significant psychiatric history comes in for evaluation of not feeling well. She does not participate in the exam. Her speech is delayed and she frequently will stare vacantly without speaking. When asked directly, she denies any medical problems, discomfort or other complaints. She was recently discharged from psychiatry on 1/31 and discharged with new medications and it is unclear whether or not she has been compliant with these medications. Limited physical exam is grossly unremarkable. Labs are overall unremarkable.She was slightly hypokalemic at 2.8, given oral potassium, which she tolerated.  Urine drug screen significant for THC. Consult to TTS for further psychiatric evaluation. Medically cleared at this time. Patient refuses TTS evaluation, they recommend psychiatric evaluation in the a.m.  Final diagnoses:  Disturbance in affect [facility])        Comer Locket, PA-C 05/29/15 [id number]  Dorie Rank, MD 05/29/15 1235

## 2015-05-28 NOTE — ED Notes (Signed)
Pt wanded by security. 

## 2015-05-28 NOTE — BH Assessment (Signed)
Tele Assessment Note   [name] is an 45 y.o. female presenting to [facility] unaccompanied. Pt stated "I feel sick". Pt denied SI at this time but did not participate in the remainder of the assessment. Pt informed this Probation officer that she did not want to participate, turned her back and closed her eyes.  Per record review, pt was recently discharged from [facility] on 05-20-15. Pt currently has an ACT team through [facility].  AM psychiatric evaluation is recommended.   Diagnosis: Schizoaffective Disorder    Past Medical History:  Past Medical History  Diagnosis Date  . Depression   . Bipolar 1 disorder ([place])   . Hemorrhoid   . Transfusion history   . Schizophrenia ([place])     Pt denies and reports it is schizoaffective disorder.   . Arthritis   . Bronchitis   . Schizoaffective disorder [facility])     Past Surgical History  Procedure Laterality Date  . Cesarean section      Family History:  Family History  Problem Relation Age of Onset  . Diabetes type II Other     Social History:  reports that she has been smoking Cigarettes.  She has a 8.5 pack-year smoking history. She has never used smokeless tobacco. She reports that she does not drink alcohol or use illicit drugs.  Additional Social History:  Alcohol / Drug Use History of alcohol / drug use?: Yes Substance #1 Name of Substance 1: THC  1 - Age of First Use: unknown 1 - Amount (size/oz): unknown  1 - Frequency: unknown  1 - Duration: unknown  1 - Last Use / Amount: unknown UDS is positive for THC   CIWA: CIWA-Ar BP: 143/86 mmHg Pulse Rate: 87 COWS:    PATIENT STRENGTHS: (choose at least two) Average or above average intelligence Has ACT team   Allergies:  Allergies  Allergen Reactions  . Latuda [Lurasidone Hcl] Nausea Only    shaking  . Flagyl [Metronidazole Hcl] Nausea Only  . Sulfa Antibiotics Nausea Only    nausea    Home Medications:  (Not in a hospital admission)  OB/GYN Status:  No LMP recorded.  General  Assessment Data [facility] of Assessment: [facility] ED TTS Assessment: In system Is this a Tele or Face-to-Face Assessment?: Face-to-Face Is this an Initial Assessment or a Re-assessment for this encounter?: Initial Assessment Marital status: Single Maiden name: [place]  Is patient pregnant?: No Pregnancy Status: No Living Arrangements: Alone Can pt return to current living arrangement?: Yes Admission Status: Voluntary Is patient capable of signing voluntary admission?: Yes Referral Source: Self/Family/Friend Insurance type: Medicaid      Crisis Care Plan Living Arrangements: Alone Name of Psychiatrist: [facility] Name of Therapist: [facility]  Education Status Is patient currently in school?: No Current Grade: N/A Highest grade of school patient has completed:  (Unable to assess ) Name of school:  (Unable to assess ) Contact person:  (Unable to assess)  Risk to self with the past 6 months Suicidal Ideation: No Has patient been a risk to self within the past 6 months prior to admission? : No Suicidal Intent: No Has patient had any suicidal intent within the past 6 months prior to admission? : No Is patient at risk for suicide?: No Suicidal Plan?: No Has patient had any suicidal plan within the past 6 months prior to admission? : No Access to Means: No What has been your use of drugs/alcohol within the last 12 months?: UDS is positive for THC.  Previous Attempts/Gestures:  (Unable to assess) How many times?:  (Unable to assess ) Other Self Harm Risks:  (Unable to assess ) Triggers for Past Attempts:  (Unable to assess ) Intentional Self Injurious Behavior:  (Unable to assess) Family Suicide History: No Recent stressful life event(s):  (Unable to assess ) Persecutory voices/beliefs?:  (Unable to assess) Depression:  (Unable to assess ) Depression Symptoms:  (Unable to assess) Substance abuse history and/or treatment for substance abuse?: Yes  Risk to Others within the past 6  months Homicidal Ideation:  (Unable to assess) Does patient have any lifetime risk of violence toward others beyond the six months prior to admission? : No Thoughts of Harm to Others:  (Unable to assess) Current Homicidal Intent:  (Unable to assess) Current Homicidal Plan:  (Unable to assess) Access to Homicidal Means:  (Unable to assess) Identified Victim:  (Unable to assess) History of harm to others?:  (Unable to assess ) Assessment of Violence: None Noted Violent Behavior Description: No violent behaviors observed at this time.  Does patient have access to weapons?: No Criminal Charges Pending?: No Does patient have a court date: No Is patient on probation?: No  Psychosis Hallucinations:  (Unable to assess ) Delusions:  (Unable to assess )  Mental Status Report Appearance/Hygiene: Unremarkable Eye Contact: Unable to Assess Motor Activity: Unable to assess Speech: Logical/coherent Level of Consciousness: Quiet/awake Mood:  (Unable to assess ) Affect: Blunted Anxiety Level: None Thought Processes: Unable to Assess Judgement: Unable to Assess Orientation: Unable to assess Obsessive Compulsive Thoughts/Behaviors: Unable to Assess  Cognitive Functioning Concentration: Unable to Assess Memory: Unable to Assess IQ: Average Insight: Unable to Assess Impulse Control: Unable to Assess Appetite:  (Unable to assess ) Weight Loss:  (Unable to assess ) Weight Gain:  (Unable to assess ) Sleep: Unable to Assess Total Hours of Sleep:  (Unable to assess) Vegetative Symptoms: Unable to Assess  ADLScreening [facility] Assessment Services) Patient's cognitive ability adequate to safely complete daily activities?: Yes Patient able to express need for assistance with ADLs?: Yes Independently performs ADLs?: Yes (appropriate for developmental age)  Prior Inpatient Therapy Prior Inpatient Therapy: Yes Prior Therapy Dates: 2007, 2009, 2010, 2013, 2017 Prior Therapy Facilty/Provider(s):  [facility]  Reason for Treatment: Schizoaffective   Prior Outpatient Therapy Prior Outpatient Therapy: Yes Prior Therapy Dates: current Prior Therapy Facilty/Provider(s): [facility] Reason for Treatment: unknown Does patient have an ACCT team?: Yes Does patient have Intensive In-House Services?  : No Does patient have [facility] services? : No Does patient have P4CC services?: No  ADL Screening (condition at time of admission) Patient's cognitive ability adequate to safely complete daily activities?: Yes Is the patient deaf or have difficulty hearing?: No Does the patient have difficulty seeing, even when wearing glasses/contacts?: No Does the patient have difficulty concentrating, remembering, or making decisions?: No Patient able to express need for assistance with ADLs?: Yes Does the patient have difficulty dressing or bathing?: No Independently performs ADLs?: Yes (appropriate for developmental age)       Abuse/Neglect Assessment (Assessment to be complete while patient is alone) Physical Abuse: Denies Verbal Abuse: Denies Sexual Abuse: Denies Exploitation of patient/patient's resources: Denies Self-Neglect: Denies     Regulatory affairs officer (For Healthcare) Does patient have an advance directive?: No Would patient like information on creating an advanced directive?: No - patient declined information    Additional Information 1:1 In Past 12 Months?: No CIRT Risk: No Elopement Risk: No Does patient have medical clearance?: Yes  Disposition:  Disposition Initial Assessment Completed for this Encounter: Yes  Ranbir Chew S 05/28/2015 9:06 PM

## 2015-05-28 NOTE — ED Notes (Signed)
Patient reports she feels weak and "sick". Pt will not describe any other concerns. Also, she will not answer if she is nausea, vomiting, having fever. Pt stares off into the distance. Pt has a steady gait around the room.

## 2015-05-29 ENCOUNTER — Observation Stay (HOSPITAL_COMMUNITY): Admission: AD | Admit: 2015-05-29 | Payer: Medicaid Other | Source: Intra-hospital | Admitting: Psychiatry

## 2015-05-29 DIAGNOSIS — F1220 Cannabis dependence, uncomplicated: Secondary | ICD-10-CM

## 2015-05-29 DIAGNOSIS — F39 Unspecified mood [affective] disorder: Secondary | ICD-10-CM

## 2015-05-29 DIAGNOSIS — F250 Schizoaffective disorder, bipolar type: Secondary | ICD-10-CM | POA: Diagnosis not present

## 2015-05-29 DIAGNOSIS — R4589 Other symptoms and signs involving emotional state: Secondary | ICD-10-CM | POA: Insufficient documentation

## 2015-05-29 MED ORDER — PERPHENAZINE 4 MG PO TABS
4.0000 mg | ORAL_TABLET | Freq: Two times a day (BID) | ORAL | Status: DC
  Filled 2015-05-29 (×3): qty 1

## 2015-05-29 MED ORDER — TRAZODONE HCL 100 MG PO TABS: 200.0000 mg | ORAL_TABLET | Freq: Every day | ORAL | Status: DC

## 2015-05-29 NOTE — ED Notes (Signed)
Pt presents with paranoia. Pt denies SI/HI. Pt forwards little with this nurse,guarded.  Pt reports she is at the hospital because "I wasn't feeling well" "Everything, anxiety and stuff" Voice soft, poor eye contact noted.Pt refusing to take medication, reports she does not need it, despite much encouragement from this nurse. Remains on special checks q 15 mins for safety. Will continue to monitor.

## 2015-05-29 NOTE — ED Notes (Signed)
Per Mechele Claude, [facility] patient will not be going to OBS unit tonight. Mechele Claude, [facility] states " Randall Hiss and I feel like she's not appropriate for OBS unit at this time". States "patient will remain in [facility] until in morning to for admission bed on unit. Writer informed patient and Beverely Low, TTS counsleor that patient would not be transferred tonight. Encouragement and support provided and safety maintain. Q 15 min safety checks remain in place.

## 2015-05-29 NOTE — Consult Note (Signed)
[place] [facility]iat[facility] Consult   Reason for Consult:  R/O Psychosis, unspecified mood disorder Referring Physician: EDP Patient Identification: [name] MRN:  [id number] Principal Diagnosis: Schizoaffective disorder, bipolar type [facility]) Diagnosis:   Patient Active Problem List   Diagnosis Date Noted  . Schizoaffective disorder, bipolar type ([place]) [F25.0] 12/12/2006    Priority: High  . Tobacco use disorder [F17.200] 05/12/2015  . Cannabis use disorder, moderate, dependence ([place]) [F12.20] 05/12/2015  . Hypertension [I10] 02/24/2011  . ANEMIA-IRON DEFICIENCY [D50.9] 12/13/2006  . GERD [K21.9] 12/13/2006  . OBESITY [E66.9] 12/12/2006  . HEMORRHOIDS, NOS [K64.9] 12/12/2006  . PAIN-NECK [M54.2] 12/12/2006  . TREMOR [R25.9] 12/12/2006    Total Time spent with patient: 45 minutes  Subjective:   [name] is a 45 y.o. female patient admitted with Unspecified mood disordeR/O Psychosisr,   HPI:  Attempted to evaluate [name], 45 years old who was brought in by EMS but could not as patient could not engage in the conversation.  Patient called EMS yesterday stating she was not feeling well but could not explain what was going on.  Patient was discharged from our inpatient [place] two weeks ago and may not have been taking her medications.  When asked questions she responds  "I don't know" and she looks around with a stare but no answer or response.  Patient could not explain to providers what led to the visit and what is going on with her.  She look disheveled and unkempt.  She has been accepted for admission and we will plan to admit overnight at our observation unit.  Past Psychiatric History:   Schizoaffective disorder Bipolar type, Cannabis use disorder  Risk to Self: Suicidal Ideation: No Suicidal Intent: No Is patient at risk for suicide?: No Suicidal Plan?: No Access to Means: No What has been your use of drugs/alcohol within the last 12 months?: UDS is positive for  THC.  How many times?:  (Unable to assess ) Other Self Harm Risks:  (Unable to assess ) Triggers for Past Attempts:  (Unable to assess ) Intentional Self Injurious Behavior:  (Unable to assess) Risk to Others: Homicidal Ideation:  (Unable to assess) Thoughts of Harm to Others:  (Unable to assess) Current Homicidal Intent:  (Unable to assess) Current Homicidal Plan:  (Unable to assess) Access to Homicidal Means:  (Unable to assess) Identified Victim:  (Unable to assess) History of harm to others?:  (Unable to assess ) Assessment of Violence: None Noted Violent Behavior Description: No violent behaviors observed at this time.  Does patient have access to weapons?: No Criminal Charges Pending?: No Does patient have a court date: No Prior Inpatient Therapy: Prior Inpatient Therapy: Yes Prior Therapy Dates: 2007, 2009, 2010, 2013, 2017 Prior Therapy Facilty/Provider(s): [facility]  Reason for Treatment: Schizoaffective  Prior Outpatient Therapy: Prior Outpatient Therapy: Yes Prior Therapy Dates: current Prior Therapy Facilty/Provider(s): [facility] Reason for Treatment: unknown Does patient have an ACCT team?: Yes Does patient have Intensive In-House Services?  : No Does patient have [facility] services? : No Does patient have [facility] services?: No  Past Medical History:  Past Medical History  Diagnosis Date  . Depression   . Bipolar 1 disorder ([place])   . Hemorrhoid   . Transfusion history   . Schizophrenia ([place])     Pt denies and reports it is schizoaffective disorder.   . Arthritis   . Bronchitis   . Schizoaffective disorder [facility])     Past Surgical History  Procedure Laterality Date  .  Cesarean section     Family History:  Family History  Problem Relation Age of Onset  . Diabetes type II Other    Family Psychiatric  History: unable to obtain Social History:  History  Alcohol Use No     History  Drug Use No    Comment: Former user    Social History   Social History  .  Marital Status: Legally Separated    Spouse Name: N/A  . Number of Children: N/A  . Years of Education: N/A   Social History Main Topics  . Smoking status: Current Every Day Smoker -- 0.50 packs/day for 17 years    Types: Cigarettes  . Smokeless tobacco: Never Used  . Alcohol Use: No  . Drug Use: No     Comment: Former user  . Sexual Activity: Not Asked   Other Topics Concern  . None   Social History Narrative   ** Merged History Encounter **       Additional Social History:    Allergies:   Allergies  Allergen Reactions  . Latuda [Lurasidone Hcl] Nausea Only    shaking  . Flagyl [Metronidazole Hcl] Nausea Only  . Sulfa Antibiotics Nausea Only    nausea    Labs:  Results for orders placed or performed during the hospital encounter of 05/28/15 (from the past 48 hour(s))  I-Stat beta hCG blood, ED     Status: None   Collection Time: 05/28/15  6:52 PM  Result Value Ref Range   I-stat hCG, quantitative <5.0 <5 mIU/mL   Comment 3            Comment:   GEST. AGE      CONC.  (mIU/mL)   <=1 WEEK        5 - 50     2 WEEKS       50 - 500     3 WEEKS       100 - 10,000     4 WEEKS     1,000 - 30,000        FEMALE AND NON-PREGNANT FEMALE:     LESS THAN 5 mIU/mL   Salicylate level     Status: None   Collection Time: 05/28/15  7:06 PM  Result Value Ref Range   Salicylate Lvl <7.9 2.8 - 30.0 mg/dL  CBC with Differential     Status: Abnormal   Collection Time: 05/28/15  7:06 PM  Result Value Ref Range   WBC 11.4 (H) 4.0 - 10.5 K/uL   RBC 4.00 3.87 - 5.11 MIL/uL   Hemoglobin 9.5 (L) 12.0 - 15.0 g/dL   HCT 31.3 (L) 36.0 - 46.0 %   MCV 78.3 78.0 - 100.0 fL   MCH 23.8 (L) 26.0 - 34.0 pg   MCHC 30.4 30.0 - 36.0 g/dL   RDW 17.8 (H) 11.5 - 15.5 %   Platelets 492 (H) 150 - 400 K/uL   Neutrophils Relative % 63 %   Neutro Abs 7.2 1.7 - 7.7 K/uL   Lymphocytes Relative 29 %   Lymphs Abs 3.4 0.7 - 4.0 K/uL   Monocytes Relative 6 %   Monocytes Absolute 0.7 0.1 - 1.0 K/uL    Eosinophils Relative 0 %   Eosinophils Absolute 0.0 0.0 - 0.7 K/uL   Basophils Relative 0 %   Basophils Absolute 0.1 0.0 - 0.1 K/uL  Urinalysis, Routine w reflex microscopic     Status: Abnormal   Collection Time: 05/28/15  7:06 PM  Result Value Ref Range   Color, Urine AMBER (A) YELLOW    Comment: BIOCHEMICALS MAY BE AFFECTED BY COLOR   APPearance CLOUDY (A) CLEAR   Specific Gravity, Urine 1.036 (H) 1.005 - 1.030   pH 6.0 5.0 - 8.0   Glucose, UA NEGATIVE NEGATIVE mg/dL   Hgb urine dipstick NEGATIVE NEGATIVE   Bilirubin Urine SMALL (A) NEGATIVE   Ketones, ur 15 (A) NEGATIVE mg/dL   Protein, ur 100 (A) NEGATIVE mg/dL   Nitrite NEGATIVE NEGATIVE   Leukocytes, UA NEGATIVE NEGATIVE  Comprehensive metabolic panel     Status: Abnormal   Collection Time: 05/28/15  7:06 PM  Result Value Ref Range   Sodium 137 135 - 145 mmol/L   Potassium 2.8 (L) 3.5 - 5.1 mmol/L   Chloride 99 (L) 101 - 111 mmol/L   CO2 27 22 - 32 mmol/L   Glucose, Bld 111 (H) 65 - 99 mg/dL   BUN 11 6 - 20 mg/dL   Creatinine, Ser 0.95 0.44 - 1.00 mg/dL   Calcium 9.3 8.9 - 10.3 mg/dL   Total Protein 8.8 (H) 6.5 - 8.1 g/dL   Albumin 4.3 3.5 - 5.0 g/dL   AST 29 15 - 41 U/L   ALT 19 14 - 54 U/L   Alkaline Phosphatase 71 38 - 126 U/L   Total Bilirubin 0.2 (L) 0.3 - 1.2 mg/dL   GFR calc non Af Amer >60 >60 mL/min   GFR calc Af Amer >60 >60 mL/min    Comment: (NOTE) The eGFR has been calculated using the CKD EPI equation. This calculation has not been validated in all clinical situations. eGFR's persistently <60 mL/min signify possible Chronic Kidney Disease.    Anion gap 11 5 - 15  Lipase, blood     Status: None   Collection Time: 05/28/15  7:06 PM  Result Value Ref Range   Lipase 20 11 - 51 U/L  Urine microscopic-add on     Status: Abnormal   Collection Time: 05/28/15  7:06 PM  Result Value Ref Range   Squamous Epithelial / LPF 6-30 (A) NONE SEEN   WBC, UA 0-5 0 - 5 WBC/hpf   RBC / HPF 6-30 0 - 5 RBC/hpf    Bacteria, UA FEW (A) NONE SEEN   Urine-Other AMORPHOUS URATES/PHOSPHATES     Comment: MUCOUS PRESENT  Urine rapid drug screen (hosp performed)     Status: Abnormal   Collection Time: 05/28/15  7:07 PM  Result Value Ref Range   Opiates NONE DETECTED NONE DETECTED   Cocaine NONE DETECTED NONE DETECTED   Benzodiazepines NONE DETECTED NONE DETECTED   Amphetamines NONE DETECTED NONE DETECTED   Tetrahydrocannabinol POSITIVE (A) NONE DETECTED   Barbiturates NONE DETECTED NONE DETECTED    Comment:        DRUG SCREEN FOR MEDICAL PURPOSES ONLY.  IF CONFIRMATION IS NEEDED FOR ANY PURPOSE, NOTIFY LAB WITHIN 5 DAYS.        LOWEST DETECTABLE LIMITS FOR URINE DRUG SCREEN Drug Class       Cutoff (ng/mL) Amphetamine      1000 Barbiturate      200 Benzodiazepine   629 Tricyclics       476 Opiates          300 Cocaine          300 THC              50     Current Facility-Administered Medications  Medication Dose Route Frequency Provider  Last Rate Last Dose  . amantadine (SYMMETREL) capsule 100 mg  100 mg Oral BID Comer Locket, PA-C   100 mg at 05/28/15 2246  . nicotine polacrilex (NICORETTE) gum 2 mg  2 mg Oral PRN Comer Locket, PA-C      . perphenazine (TRILAFON) tablet 4 mg  4 mg Oral BID Juanmanuel Marohl, MD      . potassium chloride SA (K-DUR,KLOR-CON) CR tablet 40 mEq  40 mEq Oral Once [employer], PA-C   40 mEq at 05/28/15 2013  . traZODone (DESYREL) tablet 200 mg  200 mg Oral QHS Corena Pilgrim, MD       Current Outpatient Prescriptions  Medication Sig Dispense Refill  . amantadine (SYMMETREL) 100 MG capsule Take 1 capsule (100 mg total) by mouth 2 (two) times daily. For prevention of drug induced tremors 60 capsule 0  . nicotine polacrilex (NICORETTE) 2 MG gum Take 1 each (2 mg total) by mouth as needed for smoking cessation. 100 tablet 0  . perphenazine (TRILAFON) 4 MG tablet Take 2 tablets (8 mg) in the mornings & 3 tablets (12 mg) at bedtime: For mood control 150  tablet 0  . pyridOXINE (VITAMIN B-6) 100 MG tablet Take 1 tablet (100 mg total) by mouth daily. For low B-6 Vitamin    . traZODone (DESYREL) 150 MG tablet Take 1 tablet (150 mg total) by mouth at bedtime. For insomnia 30 tablet 0    Musculoskeletal: Strength & Muscle Tone: within normal limits Gait & Station: normal Patient leans: N/A  Psychiatric Specialty Exam: Review of Systems  Unable to perform ROS: mental acuity    Blood pressure 117/71, pulse 74, temperature 98 F (36.7 C), temperature source Oral, resp. rate 21, height 5' 4"  (1.626 m), weight 108.863 kg (240 lb), SpO2 100 %.Body mass index is 41.18 kg/(m^2).  General Appearance: Casual  Eye Contact::  Fair  Speech:  Slow and Not willing to engage in the conversation  Volume:  Decreased  Mood:  Dysphoric  Affect:  Congruent  Thought Process:  Disorganized  Orientation:  Other:  unable to obtain, states"I don't know"  Thought Content:  unable to obtain, did not answer questions.  Suicidal Thoughts:  unable to obtain  Homicidal Thoughts:  unable to obtain  Memory:  Immediate;   unable to obtain Recent;   unable to obtain Remote;   unable to obtain  Judgement:  Other:  unable to obtain  Insight:  unable to obtain, patient is not answering questions.  Psychomotor Activity:  Psychomotor Retardation  Concentration:  Poor  Recall:  unable to obtain  Fund of Knowledge:Poor  Language: Poor  Akathisia:  No  Handed:  Right  AIMS (if indicated):     Assets:  Desire for Improvement  ADL's:  Intact  Cognition: WNL  Sleep:      Treatment Plan Summary: Daily contact with patient to assess and evaluate symptoms and progress in treatment and Medication management  Disposition:   Accepted at observation unit and we will be transferring patient as soon as bed becomes available.  We have resumed all of her home medications.  Delfin Gant, NP   PMHNP-BC 05/29/2015 2:25 PM Patient seen face-to-face for psychiatric evaluation,  chart reviewed and case discussed with the physician extender and developed treatment plan. Reviewed the information documented and agree with the treatment plan. Corena Pilgrim, MD

## 2015-05-29 NOTE — BH Assessment (Signed)
[place] Assessment Progress Note  Per Corena Pilgrim, MD, this pt would benefit from admission to the [facility] Observation Unit at this time. Rory Percy, RN, [facility] has assigned pt to Obs 4 with a caveat that pt is not to be transported until after 19:30. Pt has signed Voluntary Admission and Consent for Treatment, as well as Consent to Release Information to the [facility] ACT Team, her outpatient provider, and a notification call has been placed.  Signed forms have been faxed to [facility]. Pt's nurse, Caryl Pina, has been notified, and agrees to send original paperwork along with pt via [place] [place], and to call report to [phone] or [phone].    Jalene Mullet, [place]  Triage Specialist  [phone]

## 2015-05-29 NOTE — Progress Notes (Signed)
Entered in d/c instructions [facility] Schedule an appointment as soon as possible for a visit As needed [place] [address] [phone] [facility] This is your assigned Medicaid [place] access doctor If you prefer another contact [place] assigned your doctor *You may receive a bill if you go to any family Dr not assigned to you pcp is [facility] [address] [place] [phone] Medicaid [place] Access Covered Patient [place]: [phone] [address] [place], [place] [address] [email or web address] Use this website to assist with understanding your coverage & to renew application As a Medicaid client you MUST contact DSS/SSI each time you change address, move to another [place] or another state to keep your address updated [place] Medicaid Transportation to Dr appts if you are have full Medicaid: [phone], [phone]

## 2015-05-29 NOTE — ED Notes (Signed)
Pt taking shower.  

## 2015-05-30 DIAGNOSIS — F250 Schizoaffective disorder, bipolar type: Secondary | ICD-10-CM | POA: Diagnosis not present

## 2015-05-30 DIAGNOSIS — F1220 Cannabis dependence, uncomplicated: Secondary | ICD-10-CM | POA: Diagnosis not present

## 2015-05-30 MED ORDER — PERPHENAZINE 4 MG PO TABS: 4.0000 mg | ORAL_TABLET | Freq: Two times a day (BID) | ORAL | Status: DC

## 2015-05-30 MED ORDER — TRAZODONE HCL 100 MG PO TABS: 200.0000 mg | ORAL_TABLET | Freq: Every day | ORAL | Status: DC

## 2015-05-30 MED ORDER — POTASSIUM CHLORIDE CRYS ER 10 MEQ PO TBCR
10.0000 meq | EXTENDED_RELEASE_TABLET | Freq: Every day | ORAL | Status: DC
  Filled 2015-05-30: qty 1

## 2015-05-30 NOTE — BH Assessment (Signed)
[place] Assessment Progress Note  Per Corena Pilgrim, MD, this pt does not require psychiatric hospitalization at this time.  Pt is to be discharged from [facility] with recommendation to follow up with the [facility], her current outpatient provider.  This has been included in pt's discharge instructions.  At Dr Marquis Buggy request this writer called [facility] to inform them that pt is being discharged and to ask them to pick her up.  At 10:58 I spoke to [place] at [facility]  She agrees to have ACT Team come for her, but is uncertain what time that will be.  At 11:08 [name] from the ACT Team calls, confirming that she will come to the ED for the pt, but she too is uncertain when.  Pt's nurse, Caryl Pina, has been notified.  Jalene Mullet, [place] Triage Specialist [phone]

## 2015-05-30 NOTE — ED Notes (Signed)
Patient continues to refuse medication.

## 2015-05-30 NOTE — ED Notes (Addendum)
Pt discharged home per MD order. Discharge summary reviewed with pt. RX given. Pt eager to be discharged  Pt denies SI/HI.  Forwards little with this nurse. ACT team member on unit to pick up pt. Pt signed for personal belongings and belongings returned. Pt ambulatory out of facility.

## 2015-05-30 NOTE — Discharge Instructions (Signed)
For your ongoing mental health needs, you are advised to continue treatment with the [facility] ACT Team:       [facility] ACT Team      The [facility] [employer] [facility], [address]      [address]      [place], [place]  [address]      [phone]

## 2015-05-30 NOTE — Consult Note (Signed)
[place] Psychiatry Consult   Reason for Consult:  Hallucinations Referring Physician:  EDP Patient Identification: [name] MRN:  [id number] Principal Diagnosis: Schizoaffective disorder, bipolar type [facility]) Diagnosis:   Patient Active Problem List   Diagnosis Date Noted  . Cannabis use disorder, moderate, dependence ([place]) [F12.20] 05/12/2015    Priority: High  . Schizoaffective disorder, bipolar type ([place]) [F25.0] 12/12/2006    Priority: High  . Disturbance in affect ([place]) [F39]   . Tobacco use disorder [F17.200] 05/12/2015  . Hypertension [I10] 02/24/2011  . ANEMIA-IRON DEFICIENCY [D50.9] 12/13/2006  . GERD [K21.9] 12/13/2006  . OBESITY [E66.9] 12/12/2006  . HEMORRHOIDS, NOS [K64.9] 12/12/2006  . PAIN-NECK [M54.2] 12/12/2006  . TREMOR [R25.9] 12/12/2006    Total Time spent with patient: 30 minutes  Subjective:   [name] is a 45 y.o. female patient does not warrant admission.  HPI:  45 yo female who presented to the ED with "not feeling right" and slow response, appeared to be responding to internal stimuli.  Patient's medications changed and she has stabilized.  She is clear, coherent, and requests to leave.  Denies suicidal/homicidal ideations, hallucinations, and alcohol/drug abuse.  Past Psychiatric History: schizoaffective disorder  Risk to Self: Suicidal Ideation: No Suicidal Intent: No Is patient at risk for suicide?: No Suicidal Plan?: No Access to Means: No What has been your use of drugs/alcohol within the last 12 months?: UDS is positive for THC.  How many times?:  (Unable to assess ) Other Self Harm Risks:  (Unable to assess ) Triggers for Past Attempts:  (Unable to assess ) Intentional Self Injurious Behavior:  (Unable to assess) Risk to Others: Homicidal Ideation:  (Unable to assess) Thoughts of Harm to Others:  (Unable to assess) Current Homicidal Intent:  (Unable to assess) Current Homicidal Plan:  (Unable to assess) Access  to Homicidal Means:  (Unable to assess) Identified Victim:  (Unable to assess) History of harm to others?:  (Unable to assess ) Assessment of Violence: None Noted Violent Behavior Description: No violent behaviors observed at this time.  Does patient have access to weapons?: No Criminal Charges Pending?: No Does patient have a court date: No Prior Inpatient Therapy: Prior Inpatient Therapy: Yes Prior Therapy Dates: 2007, 2009, 2010, 2013, 2017 Prior Therapy Facilty/Provider(s): [facility]  Reason for Treatment: Schizoaffective  Prior Outpatient Therapy: Prior Outpatient Therapy: Yes Prior Therapy Dates: current Prior Therapy Facilty/Provider(s): [facility] Reason for Treatment: unknown Does patient have an ACCT team?: Yes Does patient have Intensive In-House Services?  : No Does patient have [facility] services? : No Does patient have P4CC services?: No  Past Medical History:  Past Medical History  Diagnosis Date  . Depression   . Bipolar 1 disorder ([place])   . Hemorrhoid   . Transfusion history   . Schizophrenia ([place])     Pt denies and reports it is schizoaffective disorder.   . Arthritis   . Bronchitis   . Schizoaffective disorder [facility])     Past Surgical History  Procedure Laterality Date  . Cesarean section     Family History:  Family History  Problem Relation Age of Onset  . Diabetes type II Other    Family Psychiatric  History: None Social History:  History  Alcohol Use No     History  Drug Use No    Comment: Former user    Social History   Social History  . Marital Status: Legally Separated    Spouse Name: N/A  . Number of  Children: N/A  . Years of Education: N/A   Social History Main Topics  . Smoking status: Current Every Day Smoker -- 0.50 packs/day for 17 years    Types: Cigarettes  . Smokeless tobacco: Never Used  . Alcohol Use: No  . Drug Use: No     Comment: Former user  . Sexual Activity: Not Asked   Other Topics Concern  . None   Social History  Narrative   ** Merged History Encounter **       Additional Social History:    Allergies:   Allergies  Allergen Reactions  . Latuda [Lurasidone Hcl] Nausea Only    shaking  . Flagyl [Metronidazole Hcl] Nausea Only  . Sulfa Antibiotics Nausea Only    nausea    Labs:  Results for orders placed or performed during the hospital encounter of 05/28/15 (from the past 48 hour(s))  I-Stat beta hCG blood, ED     Status: None   Collection Time: 05/28/15  6:52 PM  Result Value Ref Range   I-stat hCG, quantitative <5.0 <5 mIU/mL   Comment 3            Comment:   GEST. AGE      CONC.  (mIU/mL)   <=1 WEEK        5 - 50     2 WEEKS       50 - 500     3 WEEKS       100 - 10,000     4 WEEKS     1,000 - 30,000        FEMALE AND NON-PREGNANT FEMALE:     LESS THAN 5 mIU/mL   Salicylate level     Status: None   Collection Time: 05/28/15  7:06 PM  Result Value Ref Range   Salicylate Lvl <1.4 2.8 - 30.0 mg/dL  CBC with Differential     Status: Abnormal   Collection Time: 05/28/15  7:06 PM  Result Value Ref Range   WBC 11.4 (H) 4.0 - 10.5 K/uL   RBC 4.00 3.87 - 5.11 MIL/uL   Hemoglobin 9.5 (L) 12.0 - 15.0 g/dL   HCT 31.3 (L) 36.0 - 46.0 %   MCV 78.3 78.0 - 100.0 fL   MCH 23.8 (L) 26.0 - 34.0 pg   MCHC 30.4 30.0 - 36.0 g/dL   RDW 17.8 (H) 11.5 - 15.5 %   Platelets 492 (H) 150 - 400 K/uL   Neutrophils Relative % 63 %   Neutro Abs 7.2 1.7 - 7.7 K/uL   Lymphocytes Relative 29 %   Lymphs Abs 3.4 0.7 - 4.0 K/uL   Monocytes Relative 6 %   Monocytes Absolute 0.7 0.1 - 1.0 K/uL   Eosinophils Relative 0 %   Eosinophils Absolute 0.0 0.0 - 0.7 K/uL   Basophils Relative 0 %   Basophils Absolute 0.1 0.0 - 0.1 K/uL  Urinalysis, Routine w reflex microscopic     Status: Abnormal   Collection Time: 05/28/15  7:06 PM  Result Value Ref Range   Color, Urine AMBER (A) YELLOW    Comment: BIOCHEMICALS MAY BE AFFECTED BY COLOR   APPearance CLOUDY (A) CLEAR   Specific Gravity, Urine 1.036 (H) 1.005 -  1.030   pH 6.0 5.0 - 8.0   Glucose, UA NEGATIVE NEGATIVE mg/dL   Hgb urine dipstick NEGATIVE NEGATIVE   Bilirubin Urine SMALL (A) NEGATIVE   Ketones, ur 15 (A) NEGATIVE mg/dL   Protein, ur 100 (A) NEGATIVE  mg/dL   Nitrite NEGATIVE NEGATIVE   Leukocytes, UA NEGATIVE NEGATIVE  Comprehensive metabolic panel     Status: Abnormal   Collection Time: 05/28/15  7:06 PM  Result Value Ref Range   Sodium 137 135 - 145 mmol/L   Potassium 2.8 (L) 3.5 - 5.1 mmol/L   Chloride 99 (L) 101 - 111 mmol/L   CO2 27 22 - 32 mmol/L   Glucose, Bld 111 (H) 65 - 99 mg/dL   BUN 11 6 - 20 mg/dL   Creatinine, Ser 0.95 0.44 - 1.00 mg/dL   Calcium 9.3 8.9 - 10.3 mg/dL   Total Protein 8.8 (H) 6.5 - 8.1 g/dL   Albumin 4.3 3.5 - 5.0 g/dL   AST 29 15 - 41 U/L   ALT 19 14 - 54 U/L   Alkaline Phosphatase 71 38 - 126 U/L   Total Bilirubin 0.2 (L) 0.3 - 1.2 mg/dL   GFR calc non Af Amer >60 >60 mL/min   GFR calc Af Amer >60 >60 mL/min    Comment: (NOTE) The eGFR has been calculated using the CKD EPI equation. This calculation has not been validated in all clinical situations. eGFR's persistently <60 mL/min signify possible Chronic Kidney Disease.    Anion gap 11 5 - 15  Lipase, blood     Status: None   Collection Time: 05/28/15  7:06 PM  Result Value Ref Range   Lipase 20 11 - 51 U/L  Urine microscopic-add on     Status: Abnormal   Collection Time: 05/28/15  7:06 PM  Result Value Ref Range   Squamous Epithelial / LPF 6-30 (A) NONE SEEN   WBC, UA 0-5 0 - 5 WBC/hpf   RBC / HPF 6-30 0 - 5 RBC/hpf   Bacteria, UA FEW (A) NONE SEEN   Urine-Other AMORPHOUS URATES/PHOSPHATES     Comment: MUCOUS PRESENT  Urine rapid drug screen (hosp performed)     Status: Abnormal   Collection Time: 05/28/15  7:07 PM  Result Value Ref Range   Opiates NONE DETECTED NONE DETECTED   Cocaine NONE DETECTED NONE DETECTED   Benzodiazepines NONE DETECTED NONE DETECTED   Amphetamines NONE DETECTED NONE DETECTED   Tetrahydrocannabinol  POSITIVE (A) NONE DETECTED   Barbiturates NONE DETECTED NONE DETECTED    Comment:        DRUG SCREEN FOR MEDICAL PURPOSES ONLY.  IF CONFIRMATION IS NEEDED FOR ANY PURPOSE, NOTIFY LAB WITHIN 5 DAYS.        LOWEST DETECTABLE LIMITS FOR URINE DRUG SCREEN Drug Class       Cutoff (ng/mL) Amphetamine      1000 Barbiturate      200 Benzodiazepine   601 Tricyclics       093 Opiates          300 Cocaine          300 THC              50     Current Facility-Administered Medications  Medication Dose Route Frequency Provider Last Rate Last Dose  . amantadine (SYMMETREL) capsule 100 mg  100 mg Oral BID Comer Locket, PA-C   100 mg at 05/30/15 2355  . nicotine polacrilex (NICORETTE) gum 2 mg  2 mg Oral PRN Comer Locket, PA-C      . perphenazine (TRILAFON) tablet 4 mg  4 mg Oral BID Corena Pilgrim, MD   4 mg at 05/29/15 2259  . potassium chloride (K-DUR,KLOR-CON) CR tablet 10 mEq  10 mEq  Oral Daily Patrecia Pour, NP   10 mEq at 05/30/15 0932  . potassium chloride SA (K-DUR,KLOR-CON) CR tablet 40 mEq  40 mEq Oral Once Comer Locket, PA-C   40 mEq at 05/28/15 2013  . traZODone (DESYREL) tablet 200 mg  200 mg Oral QHS Corena Pilgrim, MD   200 mg at 05/29/15 2259   Current Outpatient Prescriptions  Medication Sig Dispense Refill  . amantadine (SYMMETREL) 100 MG capsule Take 1 capsule (100 mg total) by mouth 2 (two) times daily. For prevention of drug induced tremors 60 capsule 0  . nicotine polacrilex (NICORETTE) 2 MG gum Take 1 each (2 mg total) by mouth as needed for smoking cessation. 100 tablet 0  . perphenazine (TRILAFON) 4 MG tablet Take 2 tablets (8 mg) in the mornings & 3 tablets (12 mg) at bedtime: For mood control 150 tablet 0  . pyridOXINE (VITAMIN B-6) 100 MG tablet Take 1 tablet (100 mg total) by mouth daily. For low B-6 Vitamin    . traZODone (DESYREL) 150 MG tablet Take 1 tablet (150 mg total) by mouth at bedtime. For insomnia 30 tablet 0    Musculoskeletal: Strength  & Muscle Tone: within normal limits Gait & Station: normal Patient leans: N/A  Psychiatric Specialty Exam: Review of Systems  Constitutional: Negative.   HENT: Negative.   Eyes: Negative.   Respiratory: Negative.   Cardiovascular: Negative.   Gastrointestinal: Negative.   Genitourinary: Negative.   Musculoskeletal: Negative.   Skin: Negative.   Neurological: Negative.   Endo/Heme/Allergies: Negative.   Psychiatric/Behavioral: Negative.     Blood pressure 121/69, pulse 61, temperature 97.8 F (36.6 C), temperature source Oral, resp. rate 20, height 5' 4" (1.626 m), weight 108.863 kg (240 lb), SpO2 94 %.Body mass index is 41.18 kg/(m^2).  General Appearance: Casual  Eye Contact::  Good  Speech:  Normal Rate  Volume:  Normal  Mood:  Euthymic  Affect:  Congruent  Thought Process:  Coherent  Orientation:  Full (Time, Place, and Person)  Thought Content:  WDL  Suicidal Thoughts:  No  Homicidal Thoughts:  No  Memory:  Immediate;   Good Recent;   Good Remote;   Good  Judgement:  Fair  Insight:  Fair  Psychomotor Activity:  Normal  Concentration:  Good  Recall:  Good  Fund of Knowledge:Good  Language: Good  Akathisia:  No  Handed:  Right  AIMS (if indicated):     Assets:  Leisure Time Physical Health Resilience Social Support  ADL's:  Intact  Cognition: WNL  Sleep:      Treatment Plan Summary: Daily contact with patient to assess and evaluate symptoms and progress in treatment, Medication management and Plan schizoaffective disorder, bipolar type:  -Crisis stabilization -Medication management:  Continued her amantadine 100 mg BID for EPS, Trazodone 200 mg at bedtime for sleep, and Trilafon 4 mg BID for mood stabilization. -individual and substance abuse counseling -ACT team called  -Rx provided  Disposition: No evidence of imminent risk to self or others at present.    Waylan Boga, NP 05/30/2015 10:51 AM Patient seen face-to-face for psychiatric evaluation,  chart reviewed and case discussed with the physician extender and developed treatment plan. Reviewed the information documented and agree with the treatment plan. Corena Pilgrim, MD

## 2015-05-30 NOTE — ED Notes (Signed)
Writer barley speaks to this Probation officer at this time. Patient denies SI, HI and AVH at this time. Patient offer snacks and drink but refuses at this time. Encouragement and support provided and safety maintain. Q 15 min safety check remain in place.

## 2015-05-30 NOTE — [facility] Suicide Risk Assessment (Signed)
Suicide Risk Assessment  Discharge Assessment   [facility] Discharge Suicide Risk Assessment   Principal Problem: Schizoaffective disorder, bipolar type [facility]) Discharge Diagnoses:  Patient Active Problem List   Diagnosis Date Noted  . Cannabis use disorder, moderate, dependence ([place]) [F12.20] 05/12/2015    Priority: High  . Schizoaffective disorder, bipolar type ([place]) [F25.0] 12/12/2006    Priority: High  . Disturbance in affect ([place]) [F39]   . Tobacco use disorder [F17.200] 05/12/2015  . Hypertension [I10] 02/24/2011  . ANEMIA-IRON DEFICIENCY [D50.9] 12/13/2006  . GERD [K21.9] 12/13/2006  . OBESITY [E66.9] 12/12/2006  . HEMORRHOIDS, NOS [K64.9] 12/12/2006  . PAIN-NECK [M54.2] 12/12/2006  . TREMOR [R25.9] 12/12/2006    Total Time spent with patient: 30 minutes  Musculoskeletal: Strength & Muscle Tone: within normal limits Gait & Station: normal Patient leans: N/A  Psychiatric Specialty Exam: Review of Systems  Constitutional: Negative.   HENT: Negative.   Eyes: Negative.   Respiratory: Negative.   Cardiovascular: Negative.   Gastrointestinal: Negative.   Genitourinary: Negative.   Musculoskeletal: Negative.   Skin: Negative.   Neurological: Negative.   Endo/Heme/Allergies: Negative.   Psychiatric/Behavioral: Negative.     Blood pressure 121/69, pulse 61, temperature 97.8 F (36.6 C), temperature source Oral, resp. rate 20, height 5\' 4"  (1.626 m), weight 108.863 kg (240 lb), SpO2 94 %.Body mass index is 41.18 kg/(m^2).  General Appearance: Casual  Eye Contact::  Good  Speech:  Normal Rate  Volume:  Normal  Mood:  Euthymic  Affect:  Congruent  Thought Process:  Coherent  Orientation:  Full (Time, Place, and Person)  Thought Content:  WDL  Suicidal Thoughts:  No  Homicidal Thoughts:  No  Memory:  Immediate;   Good Recent;   Good Remote;   Good  Judgement:  Fair  Insight:  Fair  Psychomotor Activity:  Normal  Concentration:  Good  Recall:  Good  Fund of  Knowledge:Good  Language: Good  Akathisia:  No  Handed:  Right  AIMS (if indicated):     Assets:  Leisure Time Physical Health Resilience Social Support  ADL's:  Intact  Cognition: WNL  Sleep:       Mental Status Per Nursing Assessment::   On Admission:   Halllucinations  Demographic Factors:  Living alone  Loss Factors: NA  Historical Factors: NA  Risk Reduction Factors:   Positive social support and Positive therapeutic relationship  Continued Clinical Symptoms:  None  Cognitive Features That Contribute To Risk:  None    Suicide Risk:  Minimal: No identifiable suicidal ideation.  Patients presenting with no risk factors but with morbid ruminations; may be classified as minimal risk based on the severity of the depressive symptoms  Follow-up Information    Schedule an appointment as soon as possible for a visit with [facility].   Specialty:  Behavioral Health   Why:  As needed   Contact information:   [place] [address] [phone]       [facility] with  [facility] .   Why:  This is your assigned Medicaid [place] access doctor If you prefer another contact [place] assigned your doctor *You may receive a bill if you go to any family Dr not assigned to you   Contact information:    pcp is [facility] [address] [place] [phone]      Follow up with Medicaid Portal Access Covered Patient .   Why:  [place]: [id number]  [place], [place] [address] [email or web address] Use this website to assist with understanding your coverage & to renew application   Contact information:   As a Medicaid client you MUST contact DSS/SSI each time you change address, move to another [place] or another state to keep your address updated [place] Medicaid Transportation to Dr appts if you are have full Medicaid: [phone], [phone]       Plan Of Care/Follow-up recommendations:   Activity:  as tolerated  Diet:  heart healthy diet  Courteney Alderete, NP 05/30/2015, 11:05 AM

## 2015-06-11 ENCOUNTER — Encounter (HOSPITAL_COMMUNITY): Payer: Self-pay

## 2015-06-11 ENCOUNTER — Emergency Department (HOSPITAL_COMMUNITY)
Admission: EM | Admit: 2015-06-11 | Discharge: 2015-06-12 | Disposition: A | Discharge status: Home / Self Care | Payer: Medicaid Other | Attending: Emergency Medicine | Admitting: Emergency Medicine

## 2015-06-11 DIAGNOSIS — Z8719 Personal history of other diseases of the digestive system: Secondary | ICD-10-CM | POA: Diagnosis not present

## 2015-06-11 DIAGNOSIS — Z3202 Encounter for pregnancy test, result negative: Secondary | ICD-10-CM | POA: Insufficient documentation

## 2015-06-11 DIAGNOSIS — Z79899 Other long term (current) drug therapy: Secondary | ICD-10-CM | POA: Diagnosis not present

## 2015-06-11 DIAGNOSIS — Z8739 Personal history of other diseases of the musculoskeletal system and connective tissue: Secondary | ICD-10-CM | POA: Insufficient documentation

## 2015-06-11 DIAGNOSIS — F39 Unspecified mood [affective] disorder: Secondary | ICD-10-CM | POA: Insufficient documentation

## 2015-06-11 DIAGNOSIS — Z8709 Personal history of other diseases of the respiratory system: Secondary | ICD-10-CM | POA: Diagnosis not present

## 2015-06-11 DIAGNOSIS — F29 Unspecified psychosis not due to a substance or known physiological condition: Secondary | ICD-10-CM | POA: Insufficient documentation

## 2015-06-11 DIAGNOSIS — F319 Bipolar disorder, unspecified: Secondary | ICD-10-CM | POA: Insufficient documentation

## 2015-06-11 DIAGNOSIS — F17210 Nicotine dependence, cigarettes, uncomplicated: Secondary | ICD-10-CM | POA: Diagnosis not present

## 2015-06-11 DIAGNOSIS — F259 Schizoaffective disorder, unspecified: Secondary | ICD-10-CM | POA: Diagnosis not present

## 2015-06-11 DIAGNOSIS — Z046 Encounter for general psychiatric examination, requested by authority: Secondary | ICD-10-CM | POA: Diagnosis present

## 2015-06-11 LAB — COMPREHENSIVE METABOLIC PANEL
ALBUMIN: 3.8 g/dL (ref 3.5–5.0)
ALT: 12 U/L — AB (ref 14–54)
AST: 18 U/L (ref 15–41)
Alkaline Phosphatase: 65 U/L (ref 38–126)
Anion gap: 8 (ref 5–15)
BILIRUBIN TOTAL: 0.3 mg/dL (ref 0.3–1.2)
BUN: 8 mg/dL (ref 6–20)
CHLORIDE: 106 mmol/L (ref 101–111)
CO2: 24 mmol/L (ref 22–32)
CREATININE: 0.74 mg/dL (ref 0.44–1.00)
Calcium: 9 mg/dL (ref 8.9–10.3)
GFR calc Af Amer: >60 mL/min (ref >60)
GLUCOSE: 103 mg/dL — AB (ref 65–99)
Potassium: 3.8 mmol/L (ref 3.5–5.1)
Sodium: 138 mmol/L (ref 135–145)
TOTAL PROTEIN: 7.6 g/dL (ref 6.5–8.1)

## 2015-06-11 LAB — I-STAT BETA HCG BLOOD, ED (MC, WL, AP ONLY): I-stat hCG, quantitative: <5 m[IU]/mL (ref <5)

## 2015-06-11 LAB — ETHANOL

## 2015-06-11 LAB — CBC
HEMATOCRIT: 29.3 % — AB (ref 36.0–46.0)
Hemoglobin: 9.2 g/dL — ABNORMAL LOW (ref 12.0–15.0)
MCH: 24.4 pg — AB (ref 26.0–34.0)
MCHC: 31.4 g/dL (ref 30.0–36.0)
MCV: 77.7 fL — AB (ref 78.0–100.0)
PLATELETS: 387 10*3/uL (ref 150–400)
RBC: 3.77 MIL/uL — AB (ref 3.87–5.11)
RDW: 18 % — ABNORMAL HIGH (ref 11.5–15.5)
WBC: 7.6 10*3/uL (ref 4.0–10.5)

## 2015-06-11 LAB — ACETAMINOPHEN LEVEL: Acetaminophen (Tylenol), Serum: <10 ug/mL — ABNORMAL LOW (ref 10–30)

## 2015-06-11 LAB — SALICYLATE LEVEL: Salicylate Lvl: <4 mg/dL (ref 2.8–30.0)

## 2015-06-11 NOTE — ED Notes (Signed)
Patient barley speaks to this Probation officer. Patient denies SI, HI and AVH at this time. Encouragement and support provided and safety maintain. Q 15 min safety checks remain in place.

## 2015-06-11 NOTE — ED Notes (Signed)
Pt presents w/ [employer] after being IVC'd by [employer].  [facility] reports that the Pt has been at [facility] x 1 week and she is refusing to take medications, so they sent her to the ED.  New IVC paperwork was taken out by [facility].  Per IVC paperwork, "The respondent has schizophrenia.  She stopped medications and has been admitted in voluntarily 7 days ago to the crisis center w/ bizarre psychotic behavior and paranoia.  She continues to refuse medications and to be (illegible) and paranoid and deny her diagnosis of schizophrenia. She is not expected to be able to navigate safely in the community given the history of rapid symptom worsening off medications."

## 2015-06-11 NOTE — ED Notes (Signed)
[facility] reports that the Pt has been calm and cooperative.  Sts they had to wake her up to bring her to the ED.

## 2015-06-11 NOTE — ED Notes (Signed)
Pt will only give one word answers to questions.  When asked for further details, she just stared at this Probation officer.

## 2015-06-11 NOTE — ED Provider Notes (Signed)
CSN: [id number]     Arrival date & time 06/11/15  1358 History   First MD Initiated Contact with Patient 06/11/15 1504     Chief Complaint  Patient presents with  . IVC   . Psychosis      The history is provided by the patient. No language interpreter was used.   [name] is a 45 y.o. female who presents to the Emergency Department complaining of medical clearance.  She presents with IVC paperwork from [facility] for refusing to take her medications. Her papers the patient has a history of schizophrenia and she has been noncompliant with her medications during her one-week stay at [facility]. They are concerned that she is going to continue to decline without medication therapy. She has a history of erratic and aggressive behavior off of her medications. Patient denies any history of mental illness and she does not feel that she needs the medications. She states that she does not think that she should be forced to take the medications in the hospital if when she leaves the hospital she is not going to take any medications. She denies any current illness, medical history. She currently feels asymptomatic.  Past Medical History  Diagnosis Date  . Depression   . Bipolar 1 disorder ([place])   . Hemorrhoid   . Transfusion history   . Schizophrenia ([place])     Pt denies and reports it is schizoaffective disorder.   . Arthritis   . Bronchitis   . Schizoaffective disorder [facility])    Past Surgical History  Procedure Laterality Date  . Cesarean section     Family History  Problem Relation Age of Onset  . Diabetes type II Other    Social History  Substance Use Topics  . Smoking status: Current Every Day Smoker -- 0.50 packs/day for 17 years    Types: Cigarettes  . Smokeless tobacco: Never Used  . Alcohol Use: No   OB History    No data available     Review of Systems  All other systems reviewed and are negative.     Allergies  Latuda; Flagyl; and Sulfa antibiotics  Home  Medications   Prior to Admission medications   Medication Sig Start Date End Date Taking? Authorizing Provider  amantadine (SYMMETREL) 100 MG capsule Take 1 capsule (100 mg total) by mouth 2 (two) times daily. For prevention of drug induced tremors Patient not taking: Reported on 06/11/2015 05/20/15   Encarnacion Slates, NP  benztropine (COGENTIN) 1 MG tablet Take 1 mg by mouth 2 (two) times daily as needed for tremors. Reported on 06/11/2015    Historical Provider, MD  nicotine polacrilex (NICORETTE) 2 MG gum Take 1 each (2 mg total) by mouth as needed for smoking cessation. Patient not taking: Reported on 06/11/2015 05/20/15   Encarnacion Slates, NP  perphenazine (TRILAFON) 4 MG tablet Take 1 tablet (4 mg total) by mouth 2 (two) times daily. Patient not taking: Reported on 06/11/2015 05/30/15   Patrecia Pour, NP  pyridOXINE (VITAMIN B-6) 100 MG tablet Take 1 tablet (100 mg total) by mouth daily. For low B-6 Vitamin Patient not taking: Reported on 06/11/2015 05/20/15   Encarnacion Slates, NP  QUEtiapine (SEROQUEL) 100 MG tablet Take 100 mg by mouth 2 (two) times daily. Reported on 06/11/2015    Historical Provider, MD  traZODone (DESYREL) 100 MG tablet Take 2 tablets (200 mg total) by mouth at bedtime. Patient not taking: Reported on 06/11/2015 05/30/15   Theodoro Clock  Leander Rams, NP   BP 127/72 mmHg  Pulse 74  Temp(Src) 98.6 F (37 C) (Oral)  Resp 16  SpO2 100% Physical Exam  Constitutional: She is oriented to person, place, and time. She appears well-developed and well-nourished.  HENT:  Head: Normocephalic and atraumatic.  Cardiovascular: Normal rate and regular rhythm.   No murmur heard. Pulmonary/Chest: Effort normal and breath sounds normal. No respiratory distress.  Musculoskeletal: She exhibits no edema or tenderness.  Neurological: She is alert and oriented to person, place, and time.  Skin: Skin is warm and dry.  Psychiatric:  Flat affect. Denies SI, HI, or hallucinations. Provides very short answers to  questions.  Nursing note and vitals reviewed.   ED Course  Procedures (including critical care time) Labs Review Labs Reviewed  COMPREHENSIVE METABOLIC PANEL - Abnormal; Notable for the following:    Glucose, Bld 103 (*)    ALT 12 (*)    All other components within normal limits  ACETAMINOPHEN LEVEL - Abnormal; Notable for the following:    Acetaminophen (Tylenol), Serum <10 (*)    All other components within normal limits  CBC - Abnormal; Notable for the following:    RBC 3.77 (*)    Hemoglobin 9.2 (*)    HCT 29.3 (*)    MCV 77.7 (*)    MCH 24.4 (*)    RDW 18.0 (*)    All other components within normal limits  ETHANOL  SALICYLATE LEVEL  URINE RAPID DRUG SCREEN, HOSP PERFORMED  I-STAT BETA HCG BLOOD, ED ([facility], [facility], [facility] ONLY)    Imaging Review No results found. I have personally reviewed and evaluated these images and lab results as part of my medical decision-making.   EKG Interpretation None      MDM   Final diagnoses:  None    Patient here under IVC from [facility] for refusal is take her medicine and history of schizophrenia. Patient is calm and appropriate in the emergency department is not currently demonstrating any psychotic behavior. Plan to have the patient evaluated by the psychiatrist in the morning given her IVC was filled out by the psychiatrist at [facility].    Quintella Reichert, MD 06/12/15 [phone]

## 2015-06-11 NOTE — BH Assessment (Signed)
[place] Assessment Progress Note  At 15:02 this Probation officer received a call from [place] with the [facility] ACT Team.  Her cell phone number is [phone].  She reports that pt has been evicted from her home.  She has had conflict with a neighbor, including the pt keying the neighbor's door.  She has been engaging in erratic behavior, such as going outdoors at night without wearing a shirt.  She has not been taking her medications.  [name] is asking [facility] to consider referring the pt to [facility].  These details have been staffed with Donnelly Angelica, MD and Waldon Merl, TS.  Jalene Mullet, [place] Triage Specialist [phone]

## 2015-06-11 NOTE — BH Assessment (Addendum)
Assessment Note  [name] is an 45 y.o. female presenting to [facility] w/ [facility] after being IVC'd by [facility]. GPD reports that the pt has been at [facility] x 1 week and she is refusing to take medications, so they sent her to the ED. New IVC paperwork was taken out by [facility]. Per IVC paperwork, "The respondent has schizophrenia. She stopped medications and has been admitted in voluntarily 7 days ago to the crisis center w/ bizarre psychotic behavior and paranoia. She continues to refuse medications and to be (illegible) and paranoid and deny her diagnosis of schizophrenia. She is not expected to be able to navigate safely in the community given the history of rapid symptom worsening off medications."   [name] met with patient for a face to face assessment. Patient sts that she is here because she doesn't want to take mental health medications anymore. She is unable to recall what she is currently prescribed. She does report taking Seroquel in the past. Sts, "It worked well for me and then it stopped working". Patient's medications are prescribed by her ACT provider at [facility]. She denies SI, HI, and AVH's. Patient denies depressive symptoms. She denies anxiety. Patient does not identify any stressors. She denies family history of mental health illness. No history of abuse. Patient has a history of multiple prior hospitalizations at [facility], [facility], and a host of other facilities. No alcohol or drug use reported.   Per notes from Blima Ledger,  "At 15:02 this Probation officer received a call from [place] with the [facility] ACT Team. Her cell phone number is [phone]. She reports that pt has been evicted from her home. She has had conflict with a neighbor, including the pt keying the neighbor's door. She has been engaging in erratic behavior, such as going outdoors at night without wearing a shirt. She has not been taking her medications.[name] is asking [facility] to consider referring the pt to [facility]."         Diagnosis: Depression, Bipolar I Disorder, Schizoaffective Disorder  Past Medical History:  Past Medical History  Diagnosis Date  . Depression   . Bipolar 1 disorder ([place])   . Hemorrhoid   . Transfusion history   . Schizophrenia ([place])     Pt denies and reports it is schizoaffective disorder.   . Arthritis   . Bronchitis   . Schizoaffective disorder [facility])     Past Surgical History  Procedure Laterality Date  . Cesarean section      Family History:  Family History  Problem Relation Age of Onset  . Diabetes type II Other     Social History:  reports that she has been smoking Cigarettes.  She has a 8.5 pack-year smoking history. She has never used smokeless tobacco. She reports that she does not drink alcohol or use illicit drugs.  Additional Social History:  Alcohol / Drug Use Pain Medications: SEE MAR Prescriptions: SEE MAR Over the Counter: SEE MAR History of alcohol / drug use?: No history of alcohol / drug abuse  CIWA: CIWA-Ar BP: 127/72 mmHg Pulse Rate: 74 COWS:    Allergies:  Allergies  Allergen Reactions  . Latuda [Lurasidone Hcl] Nausea Only    shaking  . Flagyl [Metronidazole Hcl] Nausea Only  . Sulfa Antibiotics Nausea Only    nausea    Home Medications:  (Not in a hospital admission)  OB/GYN Status:  No LMP recorded.  General Assessment Data [facility] of [facility]: [facility] TTS Assessment: In system Is this a Tele  or Face-to-Face Assessment?: Face-to-Face Is this an Initial Assessment or a Re-assessment for this encounter?: Initial Assessment Marital status: Single Maiden name:  [place]) Is patient pregnant?: No Pregnancy Status: No Living Arrangements: Alone Can pt return to current living arrangement?: Yes Admission Status: Voluntary Is patient capable of signing voluntary admission?: Yes Referral Source: Self/Family/Friend Insurance type:  (Medicaid)     Crisis Care Plan Living Arrangements: Alone Name of Psychiatrist: [facility] Name of Therapist: [facility]  Education Status Is patient currently in school?: No Current Grade:  (n/a) Highest grade of school patient has completed:  (Unable to assess ) Name of school:  (Unable to assess ) Contact person:  (Unable to assess)  Risk to self with the past 6 months Suicidal Ideation: No Has patient been a risk to self within the past 6 months prior to admission? : No Suicidal Intent: No Has patient had any suicidal intent within the past 6 months prior to admission? : No Is patient at risk for suicide?: No Suicidal Plan?: No Has patient had any suicidal plan within the past 6 months prior to admission? : No Access to Means: No What has been your use of drugs/alcohol within the last 12 months?:  (UDS is positive ) Previous Attempts/Gestures: No How many times?:  (0) Other Self Harm Risks:  (n/a) Triggers for Past Attempts:  (no previous attempts or gestures ) Intentional Self Injurious Behavior: None Family Suicide History: No Recent stressful life event(s): Other (Comment) (patient sts, "I don't want to take my medications") Persecutory voices/beliefs?: No Depression: Yes Depression Symptoms: Feeling angry/irritable Substance abuse history and/or treatment for substance abuse?: Yes Suicide prevention information given to non-admitted patients: Not applicable  Risk to Others within the past 6 months Homicidal Ideation: No Does patient have any lifetime risk of violence toward others beyond the six months prior to admission? : No Thoughts of Harm to Others: No Current Homicidal Intent: No Current Homicidal Plan: No Access to Homicidal Means: No Identified Victim:  (n/a) History of harm to others?: No Assessment of Violence: None Noted Violent Behavior Description:  (patient is calm and cooperative ) Does patient have access to weapons?: No Criminal Charges Pending?: No Does patient have a court date: No Is patient on probation?:  No  Psychosis Hallucinations:  (patient denies ) Delusions:  (patient denies )  Mental Status Report Appearance/Hygiene: Unremarkable Eye Contact: Good Motor Activity: Freedom of movement Speech: Logical/coherent Level of Consciousness: Quiet/awake Mood: Depressed Affect: Blunted Anxiety Level: None Thought Processes: Coherent Judgement: Impaired Orientation: Person, Time, Place, Situation Obsessive Compulsive Thoughts/Behaviors: Unable to Assess  Cognitive Functioning Concentration: Decreased Memory: Recent Intact, Remote Intact IQ: Average Insight: Good Impulse Control: Good Appetite: Good Weight Loss:  (none reported) Weight Gain:  (none reported) Sleep: No Change Total Hours of Sleep:  (varies) Vegetative Symptoms: None  ADLScreening [facility] Assessment Services) Patient's cognitive ability adequate to safely complete daily activities?: Yes Patient able to express need for assistance with ADLs?: Yes Independently performs ADLs?: Yes (appropriate for developmental age)  Prior Inpatient Therapy Prior Inpatient Therapy: Yes Prior Therapy Dates: 2007, 2009, 2010, 2013, 2017 Prior Therapy Facilty/Provider(s): [facility]  Reason for Treatment: Schizoaffective   Prior Outpatient Therapy Prior Outpatient Therapy: Yes Prior Therapy Dates: current Prior Therapy Facilty/Provider(s): [facility] Reason for Treatment: unknown  ADL Screening (condition at time of admission) Patient's cognitive ability adequate to safely complete daily activities?: Yes Is the patient deaf or have difficulty hearing?: No Does the patient have difficulty seeing, even  when wearing glasses/contacts?: No Does the patient have difficulty concentrating, remembering, or making decisions?: Yes Patient able to express need for assistance with ADLs?: Yes Does the patient have difficulty dressing or bathing?: No Independently performs ADLs?: Yes (appropriate for developmental age) Does the patient have  difficulty walking or climbing stairs?: No Weakness of Legs: None Weakness of Arms/Hands: None  Home Assistive Devices/Equipment Home Assistive Devices/Equipment: None    Abuse/Neglect Assessment (Assessment to be complete while patient is alone) Physical Abuse: Denies Verbal Abuse: Denies Sexual Abuse: Denies Exploitation of patient/patient's resources: Denies Self-Neglect: Denies Values / Beliefs Cultural Requests During Hospitalization: None Spiritual Requests During Hospitalization: None   Advance Directives (For Healthcare) Does patient have an advance directive?: No Would patient like information on creating an advanced directive?: No - patient declined information    Additional Information 1:1 In Past 12 Months?: No CIRT Risk: No Elopement Risk: No Does patient have medical clearance?: Yes     Disposition:  Disposition Initial Assessment Completed for this Encounter: Yes  On Site Evaluation by:   Reviewed with Physician:    Evangeline Gula 06/11/2015 5:13 PM

## 2015-06-11 NOTE — ED Notes (Signed)
Pt admitted to room #41. Pt irritable on approach, loud, reports she is here d/t "They want me to take medicine, I don't want to take it, so they evicted me from my home." Pt denies SI/HI. Denies AVH. Forwards little with this nurse. "I can't go to bed at night at home because of all the noise that goes on upstairs." Pt reports she is currently stressed and her medication does not work when she is stressed. "I know I have been taking it for over 15 years"  Pt denies physical pain. Special checks q 15 mins initiated for safety.

## 2015-06-11 NOTE — ED Notes (Signed)
Patient refused vitals Rn Rashell aware

## 2015-06-12 MED ORDER — VITAMIN B-6 100 MG PO TABS
100.0000 mg | ORAL_TABLET | Freq: Every day | ORAL | Status: DC
  Administered 2015-06-12: 100 mg via ORAL
  Filled 2015-06-12: qty 1

## 2015-06-12 MED ORDER — BENZTROPINE MESYLATE 1 MG PO TABS: 1.0000 mg | ORAL_TABLET | Freq: Two times a day (BID) | ORAL | Status: DC | PRN

## 2015-06-12 MED ORDER — AMANTADINE HCL 100 MG PO CAPS
100.0000 mg | ORAL_CAPSULE | Freq: Two times a day (BID) | ORAL | Status: DC
  Administered 2015-06-12: 100 mg via ORAL
  Filled 2015-06-12 (×3): qty 1

## 2015-06-12 MED ORDER — PERPHENAZINE 4 MG PO TABS
4.0000 mg | ORAL_TABLET | Freq: Two times a day (BID) | ORAL | Status: DC
  Administered 2015-06-12 (×2): 4 mg via ORAL
  Filled 2015-06-12 (×3): qty 1

## 2015-06-12 MED ORDER — QUETIAPINE FUMARATE 100 MG PO TABS
100.0000 mg | ORAL_TABLET | Freq: Two times a day (BID) | ORAL | Status: DC
  Administered 2015-06-12: 100 mg via ORAL
  Filled 2015-06-12: qty 1

## 2015-06-12 MED ORDER — TRAZODONE HCL 100 MG PO TABS: 200.0000 mg | ORAL_TABLET | Freq: Every day | ORAL | Status: DC

## 2015-06-12 NOTE — Progress Notes (Signed)
CSW received a telephone call from Roselyn Meier, [facility] ACT Team [phone]. He stated she he would be here to pick up patient around 3:15pm. CSW Psychiatrist, NP, and the Psychiatry Team of this information.  Genice Rouge [id number] ED CSW 06/12/2015 3:13 PM

## 2015-06-12 NOTE — ED Notes (Signed)
Pt discharged to the care of her ACT team who is here to pick her up.  MD and TTS aware.  Pt is still very sleepy.

## 2015-06-12 NOTE — Progress Notes (Addendum)
Per Psychiatrist, patient has been cleared by psychiatry and is ready for discharge. CSW attempted a telephone call at 8:57am to speak with [name] on the [facility] ACT Team [phone]. Message was left with name and contact number for return call.   CSW received a return call from [place], [place] Team at 11:24am. She stated patient is on her ACT Team, however, she would speak with the person whom patient is scheduled and have that person call CSW back.  CSW has not received a telephone call from anyone as of 1:29pm. CSW left message with name and contact number for return call.   Genice Rouge [id number] ED CSW 06/12/2015 9:04 AM

## 2015-07-29 ENCOUNTER — Emergency Department (HOSPITAL_COMMUNITY)
Admission: EM | Admit: 2015-07-29 | Discharge: 2015-08-01 | Disposition: A | Discharge status: Home / Self Care | Payer: Medicaid Other | Attending: Emergency Medicine | Admitting: Emergency Medicine

## 2015-07-29 ENCOUNTER — Encounter (HOSPITAL_COMMUNITY): Payer: Self-pay

## 2015-07-29 DIAGNOSIS — Z8709 Personal history of other diseases of the respiratory system: Secondary | ICD-10-CM | POA: Insufficient documentation

## 2015-07-29 DIAGNOSIS — F17210 Nicotine dependence, cigarettes, uncomplicated: Secondary | ICD-10-CM | POA: Insufficient documentation

## 2015-07-29 DIAGNOSIS — Z8719 Personal history of other diseases of the digestive system: Secondary | ICD-10-CM | POA: Insufficient documentation

## 2015-07-29 DIAGNOSIS — F919 Conduct disorder, unspecified: Secondary | ICD-10-CM | POA: Insufficient documentation

## 2015-07-29 DIAGNOSIS — R6889 Other general symptoms and signs: Secondary | ICD-10-CM | POA: Diagnosis not present

## 2015-07-29 DIAGNOSIS — Z008 Encounter for other general examination: Secondary | ICD-10-CM | POA: Diagnosis present

## 2015-07-29 DIAGNOSIS — Z0289 Encounter for other administrative examinations: Secondary | ICD-10-CM | POA: Insufficient documentation

## 2015-07-29 DIAGNOSIS — F250 Schizoaffective disorder, bipolar type: Secondary | ICD-10-CM | POA: Diagnosis not present

## 2015-07-29 DIAGNOSIS — Z8739 Personal history of other diseases of the musculoskeletal system and connective tissue: Secondary | ICD-10-CM | POA: Insufficient documentation

## 2015-07-29 DIAGNOSIS — F209 Schizophrenia, unspecified: Secondary | ICD-10-CM | POA: Diagnosis not present

## 2015-07-29 DIAGNOSIS — Z046 Encounter for general psychiatric examination, requested by authority: Secondary | ICD-10-CM | POA: Diagnosis not present

## 2015-07-29 DIAGNOSIS — R4689 Other symptoms and signs involving appearance and behavior: Secondary | ICD-10-CM

## 2015-07-29 DIAGNOSIS — F319 Bipolar disorder, unspecified: Secondary | ICD-10-CM | POA: Diagnosis not present

## 2015-07-29 DIAGNOSIS — Z79899 Other long term (current) drug therapy: Secondary | ICD-10-CM | POA: Diagnosis not present

## 2015-07-29 MED ORDER — ZOLPIDEM TARTRATE 5 MG PO TABS: 5.0000 mg | ORAL_TABLET | Freq: Every evening | ORAL | Status: DC | PRN

## 2015-07-29 MED ORDER — IBUPROFEN 200 MG PO TABS: 600.0000 mg | ORAL_TABLET | Freq: Three times a day (TID) | ORAL | Status: DC | PRN

## 2015-07-29 MED ORDER — BENZTROPINE MESYLATE 1 MG PO TABS: 1.0000 mg | ORAL_TABLET | Freq: Two times a day (BID) | ORAL | Status: DC | PRN

## 2015-07-29 MED ORDER — PERPHENAZINE 4 MG PO TABS
4.0000 mg | ORAL_TABLET | Freq: Two times a day (BID) | ORAL | Status: DC
  Administered 2015-07-29 – 2015-08-01 (×6): 4 mg via ORAL
  Filled 2015-07-29 (×7): qty 1

## 2015-07-29 MED ORDER — AMANTADINE HCL 100 MG PO CAPS
100.0000 mg | ORAL_CAPSULE | Freq: Two times a day (BID) | ORAL | Status: DC
  Administered 2015-07-30 – 2015-08-01 (×5): 100 mg via ORAL
  Filled 2015-07-29 (×7): qty 1

## 2015-07-29 MED ORDER — QUETIAPINE FUMARATE 100 MG PO TABS
100.0000 mg | ORAL_TABLET | Freq: Two times a day (BID) | ORAL | Status: DC
  Administered 2015-07-29 – 2015-07-30 (×2): 100 mg via ORAL
  Filled 2015-07-29 (×2): qty 1

## 2015-07-29 MED ORDER — VITAMIN B-6 100 MG PO TABS
100.0000 mg | ORAL_TABLET | Freq: Every day | ORAL | Status: DC
  Administered 2015-07-29 – 2015-08-01 (×4): 100 mg via ORAL
  Filled 2015-07-29 (×4): qty 1

## 2015-07-29 MED ORDER — ACETAMINOPHEN 325 MG PO TABS: 650.0000 mg | ORAL_TABLET | ORAL | Status: DC | PRN

## 2015-07-29 MED ORDER — ALUM & MAG HYDROXIDE-SIMETH 200-200-20 MG/5ML PO SUSP: 30.0000 mL | ORAL | Status: DC | PRN

## 2015-07-29 MED ORDER — LORAZEPAM 1 MG PO TABS: 1.0000 mg | ORAL_TABLET | Freq: Three times a day (TID) | ORAL | Status: DC | PRN

## 2015-07-29 MED ORDER — TRAZODONE HCL 100 MG PO TABS
200.0000 mg | ORAL_TABLET | Freq: Every day | ORAL | Status: DC
  Administered 2015-07-29 – 2015-07-31 (×3): 200 mg via ORAL
  Filled 2015-07-29 (×3): qty 2

## 2015-07-29 MED ORDER — ONDANSETRON HCL 4 MG PO TABS: 4.0000 mg | ORAL_TABLET | Freq: Three times a day (TID) | ORAL | Status: DC | PRN

## 2015-07-29 NOTE — ED Notes (Signed)
TTS in for evaluation

## 2015-07-29 NOTE — ED Notes (Signed)
Waiting on IVC papers.

## 2015-07-29 NOTE — BH Assessment (Signed)
Tele Assessment Note   [name] is an 45 y.o. female.  -Clinician talked with PA Raquel Sarna Joy regarding need for TTS.  He said that he did not have too much information other than patient was brought in by [facility] and EMS because of acting strangely in a store.  Patient does not speak to PA but chuckles when asked if she is hearing voices.  Patient does not speak to this clinician much either.  When asked if she wanted to kill herself patient looks down but does not react otherwise.  When asked if she wanted to kill other people she says "no."  When asked if she hears and sees things she chuckles.  Patient at some point said that she could not see this clinician so clinician took off his reading glasses and patient nodded yes.  When asked where she was patient says "home."  Patient cannot say why she was brought to [facility].  Patient chuckled more when the battery started going out in the teleassessment monitor.  Upon review of previous notes, it appears that patient has ACTT services from [facility].  A person named Roselyn Meier [phone] from [facility] picked her up when she was here in February.  Due to patient not being in a condition to care for herself safely and make sound decisions regarding her current welfare, patient is being IVC'ed by EDP.  -Clinician discussed patient care with Patriciaann Clan, PA who agrees that patient needs to be reviewed by psychiatry in the AM or possibly referred out.  Patient denied at [facility] due to patient acuity.  Diagnosis: Schizoaffective d/o  Past Medical History:  Past Medical History  Diagnosis Date  . Depression   . Bipolar 1 disorder ([place])   . Hemorrhoid   . Transfusion history   . Schizophrenia ([place])     Pt denies and reports it is schizoaffective disorder.   . Arthritis   . Bronchitis   . Schizoaffective disorder [facility])     Past Surgical History  Procedure Laterality Date  . Cesarean section      Family History:  Family History  Problem Relation  Age of Onset  . Diabetes type II Other     Social History:  reports that she has been smoking Cigarettes.  She has a 8.5 pack-year smoking history. She has never used smokeless tobacco. She reports that she does not drink alcohol or use illicit drugs.  Additional Social History:  Alcohol / Drug Use Pain Medications: Unknown Prescriptions: Unknown Over the Counter: Unknown History of alcohol / drug use?: Yes Substance #1 Name of Substance 1: Reportedly uses marijuana 1 - Age of First Use: UTA 1 - Amount (size/oz): UTA 1 - Frequency: UTA 1 - Duration: UTA 1 - Last Use / Amount: UTA  CIWA: CIWA-Ar BP: 147/78 mmHg Pulse Rate: 96 COWS:    PATIENT STRENGTHS: (choose at least two) Average or above average intelligence Pt has service provider ACTT provider [facility]  Allergies:  Allergies  Allergen Reactions  . Latuda [Lurasidone Hcl] Nausea Only    shaking  . Flagyl [Metronidazole Hcl] Nausea Only  . Sulfa Antibiotics Nausea Only    nausea    Home Medications:  (Not in a hospital admission)  OB/GYN Status:  No LMP recorded (lmp unknown).  General Assessment Data [facility] Assessment: In system Is this a Tele or Face-to-Face Assessment?: Tele Assessment Is this an Initial Assessment or a Re-assessment for this encounter?: Initial Assessment Marital status:  Single Is patient pregnant?: No Pregnancy Status: No Living Arrangements: Alone Can pt return to current living arrangement?: Yes Admission Status: Involuntary Is patient capable of signing voluntary admission?: No Referral Source: Other (Brought in by [facility] & EMS) Insurance type: MCD     Crisis Care Plan Living Arrangements: Alone Name of Psychiatrist: [facility] Name of Therapist: [facility]  Education Status Is patient currently in school?: No Highest grade of school patient has completed: Unknown  Risk to self with the past 6 months Suicidal Ideation: No Has patient been a risk to self  within the past 6 months prior to admission? : No Suicidal Intent: No Has patient had any suicidal intent within the past 6 months prior to admission? : No Is patient at risk for suicide?: No Suicidal Plan?: No Has patient had any suicidal plan within the past 6 months prior to admission? : No Access to Means: No What has been your use of drugs/alcohol within the last 12 months?: Unknown Previous Attempts/Gestures:  (Unknown) How many times?:  (Unknown) Other Self Harm Risks: Unknown Triggers for Past Attempts: Unknown, Unpredictable Intentional Self Injurious Behavior: None Family Suicide History: Unknown Recent stressful life event(s): Other (Comment) (Not taking medications.) Persecutory voices/beliefs?: No Depression: Yes (Unknown) Depression Symptoms:  (Pt did not identify any depressive symptoms.) Substance abuse history and/or treatment for substance abuse?: No Suicide prevention information given to non-admitted patients: Not applicable  Risk to Others within the past 6 months Homicidal Ideation: No Does patient have any lifetime risk of violence toward others beyond the six months prior to admission? : No Thoughts of Harm to Others: No Current Homicidal Intent: No Current Homicidal Plan: No Access to Homicidal Means: No Identified Victim:  (No one) History of harm to others?: No Assessment of Violence: None Noted Violent Behavior Description:  (Pt calm at this time.) Does patient have access to weapons?: No Criminal Charges Pending?: No Does patient have a court date: No Is patient on probation?: No  Psychosis Hallucinations: Auditory (Pt chuckles when asked if hearing voices.) Delusions: None noted  Mental Status Report Appearance/Hygiene: Unremarkable Eye Contact: Good Motor Activity: Freedom of movement Speech: Soft (Only said about 5 words) Level of Consciousness: Quiet/awake Mood: Apathetic, Empty, Helpless Affect: Blunted, Flat Anxiety Level:  None Thought Processes: Unable to Assess Judgement: Impaired (Impaired due to psychosis.) Orientation: Not oriented (When asked where she was pt said "home.") Obsessive Compulsive Thoughts/Behaviors: None  Cognitive Functioning Concentration: Unable to Assess Memory: Unable to Assess IQ: Average Insight: Unable to Assess Impulse Control: Unable to Assess Appetite: Good Weight Loss: 0 Weight Gain: 0 Sleep: Unable to Assess Total Hours of Sleep:  (Unknown) Vegetative Symptoms: Decreased grooming  ADLScreening [facility] Assessment Services) Patient's cognitive ability adequate to safely complete daily activities?: Yes Patient able to express need for assistance with ADLs?: Yes Independently performs ADLs?: Yes (appropriate for developmental age)  Prior Inpatient Therapy Prior Inpatient Therapy: Yes Prior Therapy Dates: 2007, 2009, 2010, 2013, 2017 Prior Therapy Facilty/Provider(s): [facility]  Reason for Treatment: Schizoaffective   Prior Outpatient Therapy Prior Outpatient Therapy: Yes Prior Therapy Dates: current Prior Therapy Facilty/Provider(s): [facility] Reason for Treatment: unknown Does patient have an ACCT team?: Yes ([facility] ACTT team) Does patient have Intensive In-House Services?  : No Does patient have [facility] services? : No Does patient have P4CC services?: No  ADL Screening (condition at time of admission) Patient's cognitive ability adequate to safely complete daily activities?: Yes Is the patient deaf or have difficulty hearing?: No  Does the patient have difficulty seeing, even when wearing glasses/contacts?: No Does the patient have difficulty concentrating, remembering, or making decisions?: Yes Patient able to express need for assistance with ADLs?: Yes Does the patient have difficulty dressing or bathing?: No Independently performs ADLs?: Yes (appropriate for developmental age) Does the patient have difficulty walking or climbing stairs?: No Weakness of Legs:  None Weakness of Arms/Hands: None       Abuse/Neglect Assessment (Assessment to be complete while patient is alone) Physical Abuse: Denies (Unknown) Verbal Abuse: Denies (Unknown) Sexual Abuse: Denies (Unknown) Exploitation of patient/patient's resources: Denies Self-Neglect: Denies     Regulatory affairs officer (For Healthcare) Does patient have an advance directive?: No Would patient like information on creating an advanced directive?: No - patient declined information    Additional Information 1:1 In Past 12 Months?: No CIRT Risk: No Elopement Risk: No Does patient have medical clearance?: No     Disposition:  Disposition Initial Assessment Completed for this Encounter: Yes Disposition of Patient: Other dispositions Other disposition(s): Other (Comment) (To be reviewed with PA)  [name] 07/29/2015 9:47 PM

## 2015-07-29 NOTE — ED Notes (Signed)
Pt was at the grocery store and they called EMS and [employer], pt doesn't know why, pt does have a history, pt is kind of refusing treatment but will allow [place] to do things if you talk to her, pt only complains of stomach pains, no vomiting or diarrhea

## 2015-07-29 NOTE — ED Provider Notes (Signed)
CSN: [id number]     Arrival date & time 07/29/15  1946 History   First MD Initiated Contact with Patient 07/29/15 2010     Chief Complaint  Patient presents with  . Medical Clearance     (Consider location/radiation/quality/duration/timing/severity/associated sxs/prior Treatment) HPI   [name] is a 45 y.o. female, with a history of Schizophrenia or schizoaffective disorder, bipolar, and MDD, presenting to the ED for medical clearance and psych assessment. Pt was apparently picked up at a grocery store by PD and EMS, but patient doesn't know why. She states, "I guess because I wasn't feeling well." Then when patient was asked what was wrong, she chuckled and just smiled. Pt states she has not been taking her medications "for a while," but could not specify exactly how long it's been. Patient endorses hearing voices, but will not elaborate.    Past Medical History  Diagnosis Date  . Depression   . Bipolar 1 disorder ([place])   . Hemorrhoid   . Transfusion history   . Schizophrenia ([place])     Pt denies and reports it is schizoaffective disorder.   . Arthritis   . Bronchitis   . Schizoaffective disorder [facility])    Past Surgical History  Procedure Laterality Date  . Cesarean section     Family History  Problem Relation Age of Onset  . Diabetes type II Other    Social History  Substance Use Topics  . Smoking status: Current Every Day Smoker -- 0.50 packs/day for 17 years    Types: Cigarettes  . Smokeless tobacco: Never Used  . Alcohol Use: No   OB History    No data available     Review of Systems  Skin: Negative for color change and pallor.  Psychiatric/Behavioral: Positive for hallucinations.       Possible psychosis      Allergies  Latuda; Flagyl; and Sulfa antibiotics  Home Medications   Prior to Admission medications   Medication Sig Start Date End Date Taking? Authorizing Provider  perphenazine (TRILAFON) 4 MG tablet Take 1 tablet (4 mg total) by  mouth 2 (two) times daily. 05/30/15  Yes Patrecia Pour, NP  amantadine (SYMMETREL) 100 MG capsule Take 1 capsule (100 mg total) by mouth 2 (two) times daily. For prevention of drug induced tremors Patient not taking: Reported on 06/11/2015 05/20/15   Encarnacion Slates, NP  benztropine (COGENTIN) 1 MG tablet Take 1 mg by mouth 2 (two) times daily as needed for tremors. Reported on 07/29/2015    Historical Provider, MD  nicotine polacrilex (NICORETTE) 2 MG gum Take 1 each (2 mg total) by mouth as needed for smoking cessation. Patient not taking: Reported on 06/11/2015 05/20/15   Encarnacion Slates, NP  pyridOXINE (VITAMIN B-6) 100 MG tablet Take 1 tablet (100 mg total) by mouth daily. For low B-6 Vitamin Patient not taking: Reported on 06/11/2015 05/20/15   Encarnacion Slates, NP  QUEtiapine (SEROQUEL) 100 MG tablet Take 100 mg by mouth 2 (two) times daily. Reported on 07/29/2015    Historical Provider, MD  traZODone (DESYREL) 100 MG tablet Take 2 tablets (200 mg total) by mouth at bedtime. Patient not taking: Reported on 06/11/2015 05/30/15   Patrecia Pour, NP   BP 147/78 mmHg  Pulse 96  Temp(Src) 98.7 F (37.1 C)  Resp 22  SpO2 97%  LMP  (LMP Unknown) Physical Exam  Constitutional: She appears well-developed and well-nourished. No distress.  HENT:  Head: Normocephalic and atraumatic.  Eyes: Conjunctivae are normal. Pupils are equal, round, and reactive to light.  Neck: Neck supple.  Cardiovascular: Normal rate, regular rhythm, normal heart sounds and intact distal pulses.   Pulmonary/Chest: Effort normal and breath sounds normal. No respiratory distress.  Abdominal: Soft. There is no tenderness. There is no guarding.  Musculoskeletal: She exhibits no edema or tenderness.  Lymphadenopathy:    She has no cervical adenopathy.  Neurological: She is alert.  Skin: Skin is warm and dry. She is not diaphoretic.  Psychiatric: Her affect is blunt. Her speech is delayed. She is slowed.  Patient alternates between  taking poor eye contact and staring directly at the speaker without any questions. Sometimes the patient is noted to smile and chuckle to herself appropriately. When I ask a question, patient's symptoms looks off to the side, begins nodding her head, and then turns and looks back to me.  Nursing note and vitals reviewed.   ED Course  Procedures (including critical care time) Labs Review Labs Reviewed  ETHANOL  CBC  URINE RAPID DRUG SCREEN, HOSP PERFORMED  COMPREHENSIVE METABOLIC PANEL  URINALYSIS, ROUTINE W REFLEX MICROSCOPIC (NOT AT [facility])  POC URINE PREG, ED    Imaging Review No results found. I have personally reviewed and evaluated these lab results as part of my medical decision-making.   EKG Interpretation None      MDM   Final diagnoses:  Medical clearance for psychiatric admission  Abnormal behavior  Involuntary commitment  Schizophrenia, unspecified type ([place])    [name] presents for medical clearance and evaluation due to abnormal behavior.  Findings and plan of care discussed with Charlesetta Shanks, MD. Dr. Johnney Killian personally evaluated and examined this patient.  I suspect that this patient may be exhibiting negative symptoms of schizophrenia. Her behavior and responses are slowed, her affect is blunted, and she responds inappropriately to questions. She appears to be in a decompensated psychosis. She does not seem to be connected with reality. Pt agreed to allow lab work, but when Therapist, sports and lab tech attempted to collect samples, pt refused.  I believe that this patient is a danger to herself due to being unable to make decisions and care for herself. IVC paperwork completed. Pt continued to refused to allow blood to be drawn. Psych hold placed.  Filed Vitals:   07/29/15 1957 07/29/15 2120  BP: 135/62 147/78  Pulse: 96   Temp: 98.7 F (37.1 C)   Resp: 22   SpO2: 97%        Lorayne Bender, PA-C 07/30/15 0036  Charlesetta Shanks, MD 07/31/15 [phone]

## 2015-07-29 NOTE — Progress Notes (Signed)
Patient noted to have had 5 ED visits within the last six months.  Patient noted to be discharged from [facility] on 02/23 and discharged with her [facility]ACT team member Roselyn Meier [phone].  Patient with Medicaid [place] access insurance.  [facility] listed as patient's pcp on patient's insurance card.  EPIC has been updated.

## 2015-07-29 NOTE — ED Notes (Signed)
Pt refused blood work  

## 2015-07-29 NOTE — ED Notes (Signed)
PA aware that pt is refusing bloodwork

## 2015-07-30 DIAGNOSIS — Z046 Encounter for general psychiatric examination, requested by authority: Secondary | ICD-10-CM | POA: Diagnosis not present

## 2015-07-30 DIAGNOSIS — F250 Schizoaffective disorder, bipolar type: Secondary | ICD-10-CM

## 2015-07-30 DIAGNOSIS — F29 Unspecified psychosis not due to a substance or known physiological condition: Secondary | ICD-10-CM | POA: Insufficient documentation

## 2015-07-30 DIAGNOSIS — R6889 Other general symptoms and signs: Secondary | ICD-10-CM

## 2015-07-30 DIAGNOSIS — F209 Schizophrenia, unspecified: Secondary | ICD-10-CM

## 2015-07-30 DIAGNOSIS — R4689 Other symptoms and signs involving appearance and behavior: Secondary | ICD-10-CM | POA: Insufficient documentation

## 2015-07-30 LAB — CBC
HCT: 25.2 % — ABNORMAL LOW (ref 36.0–46.0)
HEMOGLOBIN: 8.1 g/dL — AB (ref 12.0–15.0)
MCH: 24 pg — AB (ref 26.0–34.0)
MCHC: 32.1 g/dL (ref 30.0–36.0)
MCV: 74.6 fL — AB (ref 78.0–100.0)
Platelets: 429 10*3/uL — ABNORMAL HIGH (ref 150–400)
RBC: 3.38 MIL/uL — ABNORMAL LOW (ref 3.87–5.11)
RDW: 18.4 % — ABNORMAL HIGH (ref 11.5–15.5)
WBC: 10.4 10*3/uL (ref 4.0–10.5)

## 2015-07-30 LAB — COMPREHENSIVE METABOLIC PANEL
ALK PHOS: 63 U/L (ref 38–126)
ALT: 10 U/L — ABNORMAL LOW (ref 14–54)
ANION GAP: 7 (ref 5–15)
AST: 15 U/L (ref 15–41)
Albumin: 3.5 g/dL (ref 3.5–5.0)
BILIRUBIN TOTAL: 0.5 mg/dL (ref 0.3–1.2)
BUN: 9 mg/dL (ref 6–20)
CALCIUM: 8.8 mg/dL — AB (ref 8.9–10.3)
CO2: 26 mmol/L (ref 22–32)
Chloride: 109 mmol/L (ref 101–111)
Creatinine, Ser: 0.68 mg/dL (ref 0.44–1.00)
Glucose, Bld: 107 mg/dL — ABNORMAL HIGH (ref 65–99)
Potassium: 3.5 mmol/L (ref 3.5–5.1)
SODIUM: 142 mmol/L (ref 135–145)
TOTAL PROTEIN: 7.1 g/dL (ref 6.5–8.1)

## 2015-07-30 LAB — ETHANOL: Alcohol, Ethyl (B): <5 mg/dL (ref <5)

## 2015-07-30 MED ORDER — QUETIAPINE FUMARATE 100 MG PO TABS
100.0000 mg | ORAL_TABLET | Freq: Every day | ORAL | Status: DC
Start: 2015-07-31 — End: 2015-08-01
  Administered 2015-07-31: 100 mg via ORAL
  Filled 2015-07-30: qty 1

## 2015-07-30 NOTE — ED Notes (Signed)
Pt is irritable and refusing vital signs.  She has slept all day and only spoke when wanting food.  She has been asked multiple times to give urine sample and she keeps saying she will.

## 2015-07-30 NOTE — BH Assessment (Signed)
[place] Assessment Progress Note  The following facilities have been contacted to seek placement for this pt, with results as noted:  Beds available, information sent, decision pending:  [place]:  Santiago Bumpers, [place] Triage Specialist [phone]

## 2015-07-30 NOTE — ED Notes (Signed)
Pt hesitant to enter the SAP[facility]. Initially refusing to go to room #34.  Awake, alert & responsive, no distress noted.  Pt not forthcoming with information.  Pt under IVC, presents exhibiting psychotic behavior.  Monitoring for safety, Q 15 min checks in effect.

## 2015-07-30 NOTE — ED Notes (Signed)
Pt is withdrawn and uncooperative.  She would not sit up and participate during MD rounds.  She only mumbled "I don't feel good"  15 minute checks and video monitoring continue.

## 2015-07-30 NOTE — ED Notes (Signed)
Bed: [facility] Expected date:  Expected time:  Means of arrival:  Comments: Room 10

## 2015-07-30 NOTE — ED Notes (Signed)
Pt refused vital signs and refused to acknowledge this Probation officer. RN notified.

## 2015-07-30 NOTE — ED Notes (Signed)
Pt refused to comply with lab work and EKG.

## 2015-07-30 NOTE — ED Notes (Signed)
Report given to [place] in [facility]

## 2015-07-30 NOTE — Consult Note (Signed)
[place] Psychiatry Consult   Reason for Consult:  Confusion, Strange behavior Referring Physician:  EDP Patient Identification: [name] MRN:  [id number] Principal Diagnosis: Schizoaffective disorder, bipolar type [facility]) Diagnosis:   Patient Active Problem List   Diagnosis Date Noted  . Schizoaffective disorder, bipolar type ([place]) [F25.0] 12/12/2006    Priority: High  . Disturbance in affect ([place]) [F39]   . Tobacco use disorder [F17.200] 05/12/2015  . Cannabis use disorder, moderate, dependence ([place]) [F12.20] 05/12/2015  . Hypertension [I10] 02/24/2011  . ANEMIA-IRON DEFICIENCY [D50.9] 12/13/2006  . GERD [K21.9] 12/13/2006  . OBESITY [E66.9] 12/12/2006  . HEMORRHOIDS, NOS [K64.9] 12/12/2006  . PAIN-NECK [M54.2] 12/12/2006  . TREMOR [R25.9] 12/12/2006    Total Time spent with patient: 30 minutes  Subjective:   [name] is a 45 y.o. female patient admitted with  Confusion, Strange behavior  HPI:  [name], 45 years old was brought in by [facility] and EMS from a store where she was acting strange, looking around and refusing to speak to anybody.  Patient have been admitted in the past at our [facility] and has a hx of Schizoaffective disorder.  Patient did not speak with providers and did not open her eyes.  Patient have not spoken to any staff members since her arrival to the ER.  She has been accepted for admission and we will be seeking placement at any facility with available bed.  Past Psychiatric History:   Schizoaffective disorder  Risk to Self: Suicidal Ideation: No Suicidal Intent: No Is patient at risk for suicide?: No Suicidal Plan?: No Access to Means: No What has been your use of drugs/alcohol within the last 12 months?: Unknown How many times?:  (Unknown) Other Self Harm Risks: Unknown Triggers for Past Attempts: Unknown, Unpredictable Intentional Self Injurious Behavior: None Risk to Others: Homicidal Ideation: No Thoughts of Harm to Others:  No Current Homicidal Intent: No Current Homicidal Plan: No Access to Homicidal Means: No Identified Victim:  (No one) History of harm to others?: No Assessment of Violence: None Noted Violent Behavior Description:  (Pt calm at this time.) Does patient have access to weapons?: No Criminal Charges Pending?: No Does patient have a court date: No Prior Inpatient Therapy: Prior Inpatient Therapy: Yes Prior Therapy Dates: 2007, 2009, 2010, 2013, 2017 Prior Therapy Facilty/Provider(s): [facility]  Reason for Treatment: Schizoaffective  Prior Outpatient Therapy: Prior Outpatient Therapy: Yes Prior Therapy Dates: current Prior Therapy Facilty/Provider(s): [facility] Reason for Treatment: unknown Does patient have an ACCT team?: Yes ([facility] ACTT team) Does patient have Intensive In-House Services?  : No Does patient have [facility] services? : No Does patient have P4CC services?: No  Past Medical History:  Past Medical History  Diagnosis Date  . Depression   . Bipolar 1 disorder ([place])   . Hemorrhoid   . Transfusion history   . Schizophrenia ([place])     Pt denies and reports it is schizoaffective disorder.   . Arthritis   . Bronchitis   . Schizoaffective disorder [facility])     Past Surgical History  Procedure Laterality Date  . Cesarean section     Family History:  Family History  Problem Relation Age of Onset  . Diabetes type II Other    Family Psychiatric  History: Unable to obtain Social History:  History  Alcohol Use No     History  Drug Use No    Comment: Former user    Social History   Social History  . Marital Status:  Legally Separated    Spouse Name: N/A  . Number of Children: N/A  . Years of Education: N/A   Social History Main Topics  . Smoking status: Current Every Day Smoker -- 0.50 packs/day for 17 years    Types: Cigarettes  . Smokeless tobacco: Never Used  . Alcohol Use: No  . Drug Use: No     Comment: Former user  . Sexual Activity: Not Asked   Other Topics  Concern  . None   Social History Narrative   ** Merged History Encounter **       Additional Social History:    Allergies:   Allergies  Allergen Reactions  . Latuda [Lurasidone Hcl] Nausea Only    shaking  . Flagyl [Metronidazole Hcl] Nausea Only  . Sulfa Antibiotics Nausea Only    nausea    Labs:  Results for orders placed or performed during the hospital encounter of 07/29/15 (from the past 48 hour(s))  Ethanol (ETOH)     Status: None   Collection Time: 07/30/15 12:36 AM  Result Value Ref Range   Alcohol, Ethyl (B) <5 <5 mg/dL    Comment:        LOWEST DETECTABLE LIMIT FOR SERUM ALCOHOL IS 5 mg/dL FOR MEDICAL PURPOSES ONLY   CBC     Status: Abnormal   Collection Time: 07/30/15 12:36 AM  Result Value Ref Range   WBC 10.4 4.0 - 10.5 K/uL   RBC 3.38 (L) 3.87 - 5.11 MIL/uL   Hemoglobin 8.1 (L) 12.0 - 15.0 g/dL   HCT 25.2 (L) 36.0 - 46.0 %   MCV 74.6 (L) 78.0 - 100.0 fL   MCH 24.0 (L) 26.0 - 34.0 pg   MCHC 32.1 30.0 - 36.0 g/dL   RDW 18.4 (H) 11.5 - 15.5 %   Platelets 429 (H) 150 - 400 K/uL  Comprehensive metabolic panel     Status: Abnormal   Collection Time: 07/30/15 12:37 AM  Result Value Ref Range   Sodium 142 135 - 145 mmol/L   Potassium 3.5 3.5 - 5.1 mmol/L   Chloride 109 101 - 111 mmol/L   CO2 26 22 - 32 mmol/L   Glucose, Bld 107 (H) 65 - 99 mg/dL   BUN 9 6 - 20 mg/dL   Creatinine, Ser 0.68 0.44 - 1.00 mg/dL   Calcium 8.8 (L) 8.9 - 10.3 mg/dL   Total Protein 7.1 6.5 - 8.1 g/dL   Albumin 3.5 3.5 - 5.0 g/dL   AST 15 15 - 41 U/L   ALT 10 (L) 14 - 54 U/L   Alkaline Phosphatase 63 38 - 126 U/L   Total Bilirubin 0.5 0.3 - 1.2 mg/dL   GFR calc non Af Amer >60 >60 mL/min   GFR calc Af Amer >60 >60 mL/min    Comment: (NOTE) The eGFR has been calculated using the CKD EPI equation. This calculation has not been validated in all clinical situations. eGFR's persistently <60 mL/min signify possible Chronic Kidney Disease.    Anion gap 7 5 - 15    Current  Facility-Administered Medications  Medication Dose Route Frequency Provider Last Rate Last Dose  . acetaminophen (TYLENOL) tablet 650 mg  650 mg Oral Q4H PRN Shawn C Joy, PA-C      . alum & mag hydroxide-simeth (MAALOX/MYLANTA) 200-200-20 MG/5ML suspension 30 mL  30 mL Oral PRN Shawn C Joy, PA-C      . amantadine (SYMMETREL) capsule 100 mg  100 mg Oral BID Lorayne Bender, PA-C  100 mg at 07/30/15 0959  . benztropine (COGENTIN) tablet 1 mg  1 mg Oral BID PRN Shawn C Joy, PA-C      . ibuprofen (ADVIL,MOTRIN) tablet 600 mg  600 mg Oral Q8H PRN Shawn C Joy, PA-C      . LORazepam (ATIVAN) tablet 1 mg  1 mg Oral Q8H PRN Shawn C Joy, PA-C      . ondansetron (ZOFRAN) tablet 4 mg  4 mg Oral Q8H PRN Shawn C Joy, PA-C      . perphenazine (TRILAFON) tablet 4 mg  4 mg Oral BID Shawn C Joy, PA-C   4 mg at 07/30/15 0959  . pyridOXINE (VITAMIN B-6) tablet 100 mg  100 mg Oral Daily Shawn C Joy, PA-C   100 mg at 07/30/15 0959  . [START ON 07/31/2015] QUEtiapine (SEROQUEL) tablet 100 mg  100 mg Oral QHS Lexie Koehl, MD      . traZODone (DESYREL) tablet 200 mg  200 mg Oral QHS Shawn C Joy, PA-C   200 mg at 07/29/15 2313  . zolpidem (AMBIEN) tablet 5 mg  5 mg Oral QHS PRN Lorayne Bender, PA-C       Current Outpatient Prescriptions  Medication Sig Dispense Refill  . perphenazine (TRILAFON) 4 MG tablet Take 1 tablet (4 mg total) by mouth 2 (two) times daily. 60 tablet 0  . amantadine (SYMMETREL) 100 MG capsule Take 1 capsule (100 mg total) by mouth 2 (two) times daily. For prevention of drug induced tremors (Patient not taking: Reported on 06/11/2015) 60 capsule 0  . benztropine (COGENTIN) 1 MG tablet Take 1 mg by mouth 2 (two) times daily as needed for tremors. Reported on 07/29/2015    . nicotine polacrilex (NICORETTE) 2 MG gum Take 1 each (2 mg total) by mouth as needed for smoking cessation. (Patient not taking: Reported on 06/11/2015) 100 tablet 0  . pyridOXINE (VITAMIN B-6) 100 MG tablet Take 1 tablet (100 mg total)  by mouth daily. For low B-6 Vitamin (Patient not taking: Reported on 06/11/2015)    . QUEtiapine (SEROQUEL) 100 MG tablet Take 100 mg by mouth 2 (two) times daily. Reported on 07/29/2015    . traZODone (DESYREL) 100 MG tablet Take 2 tablets (200 mg total) by mouth at bedtime. (Patient not taking: Reported on 06/11/2015) 30 tablet 0    Musculoskeletal: Strength & Muscle Tone: seen in bed Gait & Station: seen in bed Patient leans: see above  Psychiatric Specialty Exam: Review of Systems  Unable to perform ROS: mental acuity    Blood pressure 106/52, pulse 75, temperature 98.7 F (37.1 C), resp. rate 22, SpO2 93 %.There is no weight on file to calculate BMI.  General Appearance: Casual  Eye Contact::  None  Speech:  did no speak with providers  Volume:  none  Mood:  unable to obtain, patient did not speak with providers  Affect:  unable to obtain  Thought Process:  unable to obtain, non participating.  Orientation:  Other:  unable to obtain  Thought Content:  unable to obtain, rfused to speak  Suicidal Thoughts:  unable to obtain  Homicidal Thoughts:  unable to obtain  Memory:  unable to obtain, patient is not participating  Judgement:  Other:  unable to obtain  Insight:  unable to obtain  Psychomotor Activity:  unable to obtain  Concentration:  Poor  Recall:  unable to obtain  Fund of Knowledge:unable to obtain  Language: unable to obtain  Akathisia:  No  Handed:  Right  AIMS (if indicated):     Assets:  Others:  unable to obtain  ADL's:  Intact  Cognition: WNL  Sleep:      Treatment Plan Summary: Daily contact with patient to assess and evaluate symptoms and progress in treatment and Medication management  Disposition: Accepted for admission and we will be seeking placement at any facility with available bed.   We have resumed all home medications.  Delfin Gant, NP    PMHNP-BC 07/30/2015 12:45 PM Patient seen face-to-face for psychiatric evaluation, chart reviewed  and case discussed with the physician extender and developed treatment plan. Reviewed the information documented and agree with the treatment plan. Corena Pilgrim, MD

## 2015-07-30 NOTE — ED Notes (Signed)
When asked if pat

## 2015-07-31 LAB — RAPID URINE DRUG SCREEN, HOSP PERFORMED
AMPHETAMINES: NOT DETECTED
BENZODIAZEPINES: NOT DETECTED
Barbiturates: NOT DETECTED
Cocaine: NOT DETECTED
OPIATES: NOT DETECTED
TETRAHYDROCANNABINOL: NOT DETECTED

## 2015-07-31 LAB — URINALYSIS, ROUTINE W REFLEX MICROSCOPIC
Bilirubin Urine: NEGATIVE
GLUCOSE, UA: NEGATIVE mg/dL
HGB URINE DIPSTICK: NEGATIVE
KETONES UR: NEGATIVE mg/dL
Leukocytes, UA: NEGATIVE
Nitrite: NEGATIVE
PH: 6 (ref 5.0–8.0)
PROTEIN: NEGATIVE mg/dL
Specific Gravity, Urine: 1.027 (ref 1.005–1.030)

## 2015-07-31 NOTE — ED Notes (Signed)
Pt reminded of need for urine sample.  

## 2015-07-31 NOTE — ED Notes (Signed)
Patient is withdrawn and says bare minimal. Patient would not participate in psych assessment from this writer. Encouragement and support provided and safety maintain. Medications taken without resistance. Q 15 min safety checks remain in place.

## 2015-07-31 NOTE — ED Notes (Signed)
[name] has been minimally cooperative this a.m. She ignores most questions and initially refused medications but when this writer walked away after making several attempts to [place] into sitting up to take meds or open mouth, [name] reached her hand out for medications and took them. She has asked for sandwiches at multiple intervals despite being told of rules meals and snacks. Pt can be cranky and irritable but has presented no milieu management issue. Will continue to monitor for needs/safety.

## 2015-07-31 NOTE — ED Notes (Signed)
Patient continues to be withdrawn and soft spoken at this time. Patient barley speaks to Probation officer at this time. Encouragement and support provided and safety maintain. Q 15 min safety checks remain in place.

## 2015-08-01 DIAGNOSIS — F250 Schizoaffective disorder, bipolar type: Secondary | ICD-10-CM | POA: Diagnosis not present

## 2015-08-01 DIAGNOSIS — R6889 Other general symptoms and signs: Secondary | ICD-10-CM | POA: Diagnosis not present

## 2015-08-01 DIAGNOSIS — Z046 Encounter for general psychiatric examination, requested by authority: Secondary | ICD-10-CM | POA: Diagnosis not present

## 2015-08-01 MED ORDER — BENZTROPINE MESYLATE 1 MG PO TABS: 1.0000 mg | ORAL_TABLET | Freq: Two times a day (BID) | ORAL | Status: DC | PRN

## 2015-08-01 MED ORDER — QUETIAPINE FUMARATE 100 MG PO TABS: 100.0000 mg | ORAL_TABLET | Freq: Every day | ORAL | Status: DC

## 2015-08-01 MED ORDER — AMANTADINE HCL 100 MG PO CAPS: 100.0000 mg | ORAL_CAPSULE | Freq: Two times a day (BID) | ORAL | Status: DC

## 2015-08-01 MED ORDER — TRAZODONE HCL 100 MG PO TABS: 200.0000 mg | ORAL_TABLET | Freq: Every day | ORAL | Status: DC

## 2015-08-01 MED ORDER — PERPHENAZINE 4 MG PO TABS: 4.0000 mg | ORAL_TABLET | Freq: Two times a day (BID) | ORAL | Status: DC

## 2015-08-01 NOTE — Discharge Instructions (Signed)
For your ongoing behavioral health needs, you are advised to continue treatment with the [facility] ACT Team:       [facility] Services ACT Team      The [employer], [address]      [address]      [place], [place]  [address]      [phone]

## 2015-08-01 NOTE — ED Notes (Signed)
Pt discharged with ACT team.  All belongings were returned to pt.

## 2015-08-01 NOTE — Consult Note (Signed)
Psychiatric Specialty Exam: Physical Exam  ROS  Blood pressure 100/48, pulse 63, temperature 98.5 F (36.9 C), temperature source Oral, resp. rate 16, SpO2 99 %.There is no weight on file to calculate BMI.  General Appearance: Casual  Eye Contact::  Good  Speech:  Clear and Coherent and Minimal  Volume:  Normal  Mood:  Depressed  Affect:  Congruent  Thought Process:  Coherent and Intact  Orientation:  Full (Time, Place, and Person)  Thought Content:  WDL  Suicidal Thoughts:  No  Homicidal Thoughts:  No  Memory:  Immediate;   Good Recent;   Good Remote;   Good  Judgement:  Good  Insight:  Fair  Psychomotor Activity:  Normal  Concentration:  Fair  Recall:  NA  Fund of Knowledge:  Fair  Language:  Good  Akathisia:  NA  Handed:  Right  AIMS (if indicated):     Assets:  Desire for Improvement  ADL's:  Intact  Cognition:  WNL  Sleep:      Patient was seen awake, alert and oriented x 3.  Patient has improved and participated in the interview.  Patient denies SI/HI/AVH and laughed when asked about feeling homicidal stating " I cannot kill anybody"  Patient reports good sleep and appetite.  Patient has been compliant with her medications since her arrival.  She has an ACT team and they will be coming to take patient home. Schizoaffective disorder, bipolar type [facility])   Plan: Discharge home, follow up with your ACT team providers  and continue taking your medications.  Charmaine Downs   PMHNP-BC Patient seen face-to-face for psychiatric evaluation, chart reviewed and case discussed with the physician extender and developed treatment plan. Reviewed the information documented and agree with the treatment plan. Corena Pilgrim, MD

## 2015-08-01 NOTE — BH Assessment (Signed)
[place] Assessment Progress Note  Per Corena Pilgrim, MD, this pt does not require psychiatric hospitalization at this time.  Pt presents under IVC initiated by EDP Charlesetta Shanks, MD.  IVC is to be rescinded and pt is to be discharged with recommendation to continue service through the [facility] ACT Team.  IVC has been rescinded.  Discharge instructions advise pt to continue treatment through ACT Team.  At 12:15 I called them and spoke to [facility].  He agrees to come to [facility] to pick pt up at 13:30.  Pt's nurse, Nena Jordan, has been notified.  Jalene Mullet, [place] Triage Specialist [phone]

## 2015-08-01 NOTE — BHH Suicide Risk Assessment (Cosign Needed)
Suicide Risk Assessment  Discharge Assessment   [facility] Discharge Suicide Risk Assessment   Principal Problem: Schizoaffective disorder, bipolar type [facility]) Discharge Diagnoses:  Patient Active Problem List   Diagnosis Date Noted  . Schizoaffective disorder, bipolar type ([place]) [F25.0] 12/12/2006    Priority: Medium  . Abnormal behavior [R68.89]   . Involuntary commitment [Z04.6]   . Schizophrenia ([place]) [F20.9]   . Disturbance in affect ([place]) [F39]   . Tobacco use disorder [F17.200] 05/12/2015  . Cannabis use disorder, moderate, dependence ([place]) [F12.20] 05/12/2015  . Hypertension [I10] 02/24/2011  . ANEMIA-IRON DEFICIENCY [D50.9] 12/13/2006  . GERD [K21.9] 12/13/2006  . OBESITY [E66.9] 12/12/2006  . HEMORRHOIDS, NOS [K64.9] 12/12/2006  . PAIN-NECK [M54.2] 12/12/2006  . TREMOR [R25.9] 12/12/2006    Total Time spent with patient: 20 minutes  Musculoskeletal: Strength & Muscle Tone: within normal limits Gait & Station: normal Patient leans: N/A  Psychiatric Specialty Exam:   Blood pressure 100/48, pulse 63, temperature 98.5 F (36.9 C), temperature source Oral, resp. rate 16, SpO2 99 %.There is no weight on file to calculate BMI.  General Appearance: Casual  Eye Contact:: Good  Speech: Clear and Coherent and Minimal  Volume: Normal  Mood: Depressed  Affect: Congruent  Thought Process: Coherent and Intact  Orientation: Full (Time, Place, and Person)  Thought Content: WDL  Suicidal Thoughts: No  Homicidal Thoughts: No  Memory: Immediate; Good Recent; Good Remote; Good  Judgement: Good  Insight: Fair  Psychomotor Activity: Normal  Concentration: Fair  Recall: NA  Fund of Knowledge: Fair  Language: Good  Akathisia: NA  Handed: Right  AIMS (if indicated):    Assets: Desire for Improvement  ADL's: Intact  Cognition: WNL         Mental Status Per Nursing Assessment::   On Admission:     Demographic Factors:   Low socioeconomic status, Living alone and Unemployed  Loss Factors: NA  Historical Factors: NA  Risk Reduction Factors:   Religious beliefs about death and Positive social support  Continued Clinical Symptoms:  Bipolar Disorder:   Depressive phase Schizophrenia:   Paranoid or undifferentiated type  Cognitive Features That Contribute To Risk:  Polarized thinking    Suicide Risk:  Minimal: No identifiable suicidal ideation.  Patients presenting with no risk factors but with morbid ruminations; may be classified as minimal risk based on the severity of the depressive symptoms    Plan Of Care/Follow-up recommendations:  Activity:  As tolerated Diet:  regular  Delfin Gant, NP   PMHNP-BC 08/01/2015, 1:13 PM

## 2015-08-11 ENCOUNTER — Encounter (HOSPITAL_COMMUNITY): Payer: Self-pay | Admitting: Emergency Medicine

## 2015-08-11 ENCOUNTER — Emergency Department (HOSPITAL_COMMUNITY)
Admission: EM | Admit: 2015-08-11 | Discharge: 2015-08-14 | Disposition: A | Discharge status: Home / Self Care | Payer: Medicaid Other | Attending: Emergency Medicine | Admitting: Emergency Medicine

## 2015-08-11 DIAGNOSIS — Z8709 Personal history of other diseases of the respiratory system: Secondary | ICD-10-CM | POA: Diagnosis not present

## 2015-08-11 DIAGNOSIS — Z8719 Personal history of other diseases of the digestive system: Secondary | ICD-10-CM | POA: Insufficient documentation

## 2015-08-11 DIAGNOSIS — Z9119 Patient's noncompliance with other medical treatment and regimen: Secondary | ICD-10-CM | POA: Insufficient documentation

## 2015-08-11 DIAGNOSIS — F919 Conduct disorder, unspecified: Secondary | ICD-10-CM | POA: Insufficient documentation

## 2015-08-11 DIAGNOSIS — Z8739 Personal history of other diseases of the musculoskeletal system and connective tissue: Secondary | ICD-10-CM | POA: Diagnosis not present

## 2015-08-11 DIAGNOSIS — Z046 Encounter for general psychiatric examination, requested by authority: Secondary | ICD-10-CM | POA: Diagnosis not present

## 2015-08-11 DIAGNOSIS — R443 Hallucinations, unspecified: Secondary | ICD-10-CM | POA: Diagnosis present

## 2015-08-11 DIAGNOSIS — F22 Delusional disorders: Secondary | ICD-10-CM | POA: Insufficient documentation

## 2015-08-11 DIAGNOSIS — R4689 Other symptoms and signs involving appearance and behavior: Secondary | ICD-10-CM

## 2015-08-11 DIAGNOSIS — F17210 Nicotine dependence, cigarettes, uncomplicated: Secondary | ICD-10-CM | POA: Diagnosis not present

## 2015-08-11 DIAGNOSIS — Z0289 Encounter for other administrative examinations: Secondary | ICD-10-CM | POA: Diagnosis not present

## 2015-08-11 DIAGNOSIS — F250 Schizoaffective disorder, bipolar type: Secondary | ICD-10-CM | POA: Diagnosis present

## 2015-08-11 DIAGNOSIS — R6889 Other general symptoms and signs: Secondary | ICD-10-CM | POA: Diagnosis not present

## 2015-08-11 LAB — COMPREHENSIVE METABOLIC PANEL
ALT: 11 U/L — ABNORMAL LOW (ref 14–54)
ANION GAP: 9 (ref 5–15)
AST: 16 U/L (ref 15–41)
Albumin: 4.4 g/dL (ref 3.5–5.0)
Alkaline Phosphatase: 66 U/L (ref 38–126)
BILIRUBIN TOTAL: 0.7 mg/dL (ref 0.3–1.2)
BUN: 7 mg/dL (ref 6–20)
CO2: 24 mmol/L (ref 22–32)
Calcium: 9.6 mg/dL (ref 8.9–10.3)
Chloride: 108 mmol/L (ref 101–111)
Creatinine, Ser: 0.77 mg/dL (ref 0.44–1.00)
GFR calc Af Amer: >60 mL/min (ref >60)
Glucose, Bld: 101 mg/dL — ABNORMAL HIGH (ref 65–99)
POTASSIUM: 4.1 mmol/L (ref 3.5–5.1)
Sodium: 141 mmol/L (ref 135–145)
TOTAL PROTEIN: 8.5 g/dL — AB (ref 6.5–8.1)

## 2015-08-11 LAB — CBC
HCT: 29.3 % — ABNORMAL LOW (ref 36.0–46.0)
Hemoglobin: 9.3 g/dL — ABNORMAL LOW (ref 12.0–15.0)
MCH: 23.8 pg — ABNORMAL LOW (ref 26.0–34.0)
MCHC: 31.7 g/dL (ref 30.0–36.0)
MCV: 74.9 fL — AB (ref 78.0–100.0)
PLATELETS: 419 10*3/uL — AB (ref 150–400)
RBC: 3.91 MIL/uL (ref 3.87–5.11)
RDW: 18.6 % — AB (ref 11.5–15.5)
WBC: 11.4 10*3/uL — AB (ref 4.0–10.5)

## 2015-08-11 LAB — ETHANOL

## 2015-08-11 LAB — ACETAMINOPHEN LEVEL

## 2015-08-11 LAB — SALICYLATE LEVEL

## 2015-08-11 MED ORDER — ACETAMINOPHEN 325 MG PO TABS: 650.0000 mg | ORAL_TABLET | Freq: Four times a day (QID) | ORAL | Status: DC | PRN

## 2015-08-11 NOTE — ED Provider Notes (Signed)
CSN: [id number]     Arrival date & time 08/11/15  1213 History   First MD Initiated Contact with Patient 08/11/15 1308     Chief Complaint  Patient presents with  . IVC   . Hallucinations  . Medical Clearance     (Consider location/radiation/quality/duration/timing/severity/associated sxs/prior Treatment) Patient is a 45 y.o. female presenting with mental health disorder.  Mental Health Problem Presenting symptoms: bizarre behavior, delusional and paranoid behavior   Presenting symptoms: no homicidal ideas, no suicidal thoughts (says no), no suicidal threats (denies) and no suicide attempt  Hallucinations: does not answer.   Patient accompanied by:  Lanny Cramp enforcement Degree of incapacity (severity):  Severe Onset quality:  Unable to specify Chronicity:  New Context: noncompliance   Relieved by:  Nothing Worsened by:  Nothing tried  45 year old female with a history of schizoaffective disorder, bipolar type presents with concern for behavior with IVC place by [employer]. Patient had recently been discharged after psychiatric stay, and reportedly has been nonadherent her medications. Police report that she has called [place] their department multiple times with reported dead bodies in her apartment and larger room. Police report they evaluated the home and did not find any dead bodies, however patient continues to call reporting this complaint Past Medical History  Diagnosis Date  . Depression   . Bipolar 1 disorder ([place])   . Hemorrhoid   . Transfusion history   . Schizophrenia ([place])     Pt denies and reports it is schizoaffective disorder.   . Arthritis   . Bronchitis   . Schizoaffective disorder [facility])    Past Surgical History  Procedure Laterality Date  . Cesarean section     Family History  Problem Relation Age of Onset  . Diabetes type II Other    Social History  Substance Use Topics  . Smoking status: Current Every Day Smoker -- 0.50 packs/day for 17 years    Types: Cigarettes   . Smokeless tobacco: Never Used  . Alcohol Use: No   OB History    No data available     Review of Systems  Unable to perform ROS: Patient nonverbal  Psychiatric/Behavioral: Positive for paranoia. Negative for suicidal ideas (says no) and homicidal ideas. Hallucinations: does not answer.      Allergies  Latuda; Flagyl; and Sulfa antibiotics  Home Medications   Prior to Admission medications   Medication Sig Start Date End Date Taking? Authorizing Provider  amantadine (SYMMETREL) 100 MG capsule Take 1 capsule (100 mg total) by mouth 2 (two) times daily. For prevention of drug induced tremors Patient not taking: Reported on 06/11/2015 05/20/15   Encarnacion Slates, NP  amantadine (SYMMETREL) 100 MG capsule Take 1 capsule (100 mg total) by mouth 2 (two) times daily. Patient not taking: Reported on 08/11/2015 08/01/15   Delfin Gant, NP  benztropine (COGENTIN) 1 MG tablet Take 1 tablet (1 mg total) by mouth 2 (two) times daily as needed for tremors. Patient not taking: Reported on 08/11/2015 08/01/15   Delfin Gant, NP  nicotine polacrilex (NICORETTE) 2 MG gum Take 1 each (2 mg total) by mouth as needed for smoking cessation. Patient not taking: Reported on 06/11/2015 05/20/15   Encarnacion Slates, NP  perphenazine (TRILAFON) 4 MG tablet Take 1 tablet (4 mg total) by mouth 2 (two) times daily. Patient not taking: Reported on 08/11/2015 05/30/15   Patrecia Pour, NP  perphenazine (TRILAFON) 4 MG tablet Take 1 tablet (4 mg total) by mouth 2 (two)  times daily. Patient not taking: Reported on 08/11/2015 08/01/15   Delfin Gant, NP  pyridOXINE (VITAMIN B-6) 100 MG tablet Take 1 tablet (100 mg total) by mouth daily. For low B-6 Vitamin Patient not taking: Reported on 06/11/2015 05/20/15   Encarnacion Slates, NP  QUEtiapine (SEROQUEL) 100 MG tablet Take 100 mg by mouth 2 (two) times daily. Reported on 08/11/2015    Historical Provider, MD  QUEtiapine (SEROQUEL) 100 MG tablet Take 1 tablet (100 mg  total) by mouth at bedtime. Patient not taking: Reported on 08/11/2015 08/01/15   Delfin Gant, NP  traZODone (DESYREL) 100 MG tablet Take 2 tablets (200 mg total) by mouth at bedtime. Patient not taking: Reported on 06/11/2015 05/30/15   Patrecia Pour, NP  traZODone (DESYREL) 100 MG tablet Take 2 tablets (200 mg total) by mouth at bedtime. Patient not taking: Reported on 08/11/2015 08/01/15   Delfin Gant, NP   BP 117/60 mmHg  Pulse 73  Temp(Src) 97.6 F (36.4 C) (Oral)  SpO2 96%  LMP  (LMP Unknown) Physical Exam  Constitutional: She appears well-developed and well-nourished. No distress.  HENT:  Head: Normocephalic and atraumatic.  Eyes: Conjunctivae and EOM are normal.  Neck: Normal range of motion.  Cardiovascular: Normal rate, regular rhythm, normal heart sounds and intact distal pulses.  Exam reveals no gallop and no friction rub.   No murmur heard. Pulmonary/Chest: Effort normal and breath sounds normal. No respiratory distress. She has no wheezes. She has no rales.  Abdominal: Soft. She exhibits no distension. There is no tenderness. There is no guarding.  Musculoskeletal: She exhibits no edema or tenderness.  Neurological: She is alert.  Skin: Skin is warm and dry. No rash noted. She is not diaphoretic. No erythema.  Psychiatric: Her affect is blunt. Her speech is delayed (when she does respond). She is withdrawn. She expresses no homicidal and no suicidal ideation. She expresses no suicidal plans and no homicidal plans. She is noncommunicative.  Nursing note and vitals reviewed.   ED Course  Procedures (including critical care time) Labs Review Labs Reviewed  COMPREHENSIVE METABOLIC PANEL - Abnormal; Notable for the following:    Glucose, Bld 101 (*)    Total Protein 8.5 (*)    ALT 11 (*)    All other components within normal limits  ACETAMINOPHEN LEVEL - Abnormal; Notable for the following:    Acetaminophen (Tylenol), Serum <10 (*)    All other components  within normal limits  CBC - Abnormal; Notable for the following:    WBC 11.4 (*)    Hemoglobin 9.3 (*)    HCT 29.3 (*)    MCV 74.9 (*)    MCH 23.8 (*)    RDW 18.6 (*)    Platelets 419 (*)    All other components within normal limits  ETHANOL  SALICYLATE LEVEL  URINE RAPID DRUG SCREEN, HOSP PERFORMED    Imaging Review No results found. I have personally reviewed and evaluated these images and lab results as part of my medical decision-making.   EKG Interpretation None      MDM   Final diagnoses:  Abnormal behavior  Involuntary commitment  Schizoaffective disorder, bipolar type ([place])   46 year old female with a history of schizoaffective disorder, bipolar type presents with concern for behavior with IVC place by [employer]. Patient had recently been discharged after psychiatric stay, and reportedly has been nonadherent her medications. Police report that she has called [place] their department multiple times with reported dead  bodies in her apartment and larger room. Police report they evaluated the home and did not find any dead bodies, however patient continues to call reporting this complaint. On exam, patient has an abnormal affect, does not answer most questions, and appears to stare as if she is responding to insert internal stimuli.  Given concern for acute psychosis, and concern that patient has not thinking rationally and is a danger to herself, IVC which was placed by the police was reinstated.  TTS consult pending.  Patient is medically clear. Has not been taking any of her home medications, so these were not ordered.  Gareth Morgan, MD 08/11/15 1724

## 2015-08-11 NOTE — BH Assessment (Addendum)
Assessment Note  [name] is an 45 y.o. female presenting to [facility] under IVC.  IVC states:  Respondent has been diagnosed with bipolar, schizophrenia, and depression. Respondent is not taking prescribed meds on a regular basis. Respondent called police several times stating that there were dead bodies in her apartment and the laundry room. Police checked it out and found nothing, but she continues to call back with the same story. Respondent does not believe the police when they tell her that there are o bodies.   Patient refused to participate in TTS assessment and faced the wall for the duration of the assessment. Per chart review patient denies SI and HI and does not respond to questions about psychosis.  Patient refused to answer any questions and when asked to verify her name and date of birth patient told this Probation officer that she knew her name. Patient refused to answer questions throughout the assessment and asked this writer "What is your name?" and when this writer informed her of the name patient requested to see this writer's badge who showed her the badge. Patient reports "that says something different" and laid back down and faced the wall.  Patient did not speak for the remainder of the assessment. Patient was seen in the ED for similar reasons on 07/29/2015 and discharged on 08/01/2015.  Consulted with Ricky Ala, FNP-BC who recommends patient be observed overnight and seen in the morning by psychiatry to uphold or rescind the IVC.    Diagnosis: Schizoaffective Disorder  Past Medical History:  Past Medical History  Diagnosis Date  . Depression   . Bipolar 1 disorder ([place])   . Hemorrhoid   . Transfusion history   . Schizophrenia ([place])     Pt denies and reports it is schizoaffective disorder.   . Arthritis   . Bronchitis   . Schizoaffective disorder [facility])     Past Surgical History  Procedure Laterality Date  . Cesarean section      Family History:  Family History   Problem Relation Age of Onset  . Diabetes type II Other     Social History:  reports that she has been smoking Cigarettes.  She has a 8.5 pack-year smoking history. She has never used smokeless tobacco. She reports that she does not drink alcohol or use illicit drugs.  Additional Social History:  Alcohol / Drug Use Pain Medications: See PTA Prescriptions: See PTA Over the Counter: See PTA History of alcohol / drug use?:  (UTA) Substance #1 Name of Substance 1:  (UTA)  CIWA: CIWA-Ar BP: 117/60 mmHg Pulse Rate: 73 COWS:    Allergies:  Allergies  Allergen Reactions  . Latuda [Lurasidone Hcl] Nausea Only    shaking  . Flagyl [Metronidazole Hcl] Nausea Only  . Sulfa Antibiotics Nausea Only    nausea    Home Medications:  (Not in a hospital admission)  OB/GYN Status:  No LMP recorded (lmp unknown).  General Assessment Data [facility]: [facility] TTS Assessment: In system Is this a Tele or Face-to-Face Assessment?: Face-to-Face Is this an Initial Assessment or a Re-assessment for this encounter?: Initial Assessment Marital status:  [name] Maiden name:  (UTA) Is patient pregnant?:  (UTA) Pregnancy Status:  (UTA) Living Arrangements:  (UTA) Can pt return to current living arrangement?:  (UTA) Admission Status: Involuntary Is patient capable of signing voluntary admission?:  (UTA) Referral Source:  ([employer])     Crisis Care Plan Living Arrangements:  (UTA) Name of Psychiatrist:  ([place]) Name of  Therapist:  Special educational needs teacher)  Education Status Is patient currently in school?:  (UTA) Name of school: [employer]  Risk to self with the past 6 months Suicidal Ideation:  (UTA) Has patient been a risk to self within the past 6 months prior to admission? :  (UTA) Suicidal Intent:  (UTA) Has patient had any suicidal intent within the past 6 months prior to admission? :  (UTA) Is patient at risk for suicide?:  (UTA) Suicidal Plan?:  (UTA) Has patient had any suicidal plan within the past  6 months prior to admission? :  (UTA) Access to Means:  (UTA) What has been your use of drugs/alcohol within the last 12 months?:  (UTA) Previous Attempts/Gestures:  (UTA) How many times?:  (UTA) Other Self Harm Risks:  (UTA) Triggers for Past Attempts:  (UTA) Intentional Self Injurious Behavior:  (UTA) Family Suicide History:  (UTA) Recent stressful life event(s):  (UTA) Persecutory voices/beliefs?:  [name] Depression:  (UTA) Depression Symptoms:  (UTA) Substance abuse history and/or treatment for substance abuse?: Yes Suicide prevention information given to non-admitted patients:  (UTA)  Risk to Others within the past 6 months Homicidal Ideation:  (UTA) Does patient have any lifetime risk of violence toward others beyond the six months prior to admission? :  (UTA) Thoughts of Harm to Others:  (UTA) Current Homicidal Intent:  (UTA) Current Homicidal Plan:  (UTA) Access to Homicidal Means:  (UTA) Identified Victim:  (UTA) History of harm to others?:  (UTA) Assessment of Violence:  (UTA) Violent Behavior Description:  (UTA) Does patient have access to weapons?:  (UTA) Criminal Charges Pending?:  (UTA) Does patient have a court date:  (UTA) Is patient on probation?:  (UTA)  Psychosis Hallucinations:  (UTA) Delusions:  (UTA)  Mental Status Report Appearance/Hygiene: In scrubs Eye Contact: Unable to Assess Motor Activity: Unable to assess Speech: Unable to assess Level of Consciousness: Quiet/awake Thought Processes: Unable to Assess Judgement: Unable to Assess Orientation: Unable to assess Obsessive Compulsive Thoughts/Behaviors: Unable to Assess  Cognitive Functioning Concentration: Unable to Assess Memory: Unable to Assess Insight: Unable to Assess Impulse Control: Unable to Assess Sleep: Unable to Assess Total Hours of Sleep:  (UTA) Vegetative Symptoms: Unable to Assess     Prior Inpatient Therapy Prior Inpatient Therapy: Yes  Prior Outpatient  Therapy Prior Outpatient Therapy:  (UTA) Prior Therapy Dates:  (UTA) Prior Therapy Facilty/Provider(s):  (UTA) Reason for Treatment:  (UTA) Does patient have an ACCT team?:  (UTA) Does patient have Intensive In-House Services?  :  (UTA) Does patient have [facility] services? :  (UTA) Does patient have P4CC services?:  (UTA)        Therapy Consults (therapy consults require a physician order) PT Evaluation Needed: No OT Evalulation Needed: No SLP Evaluation Needed: No Abuse/Neglect Assessment (Assessment to be complete while patient is alone) Physical Abuse:  (UTA) Verbal Abuse:  (UTA) Sexual Abuse:  (UTA) Exploitation of patient/patient's resources:  (UTA) Self-Neglect:  (UTA) Possible abuse reported to::  (UTA) Values / Beliefs Cultural Requests During Hospitalization:  (UTA) Spiritual Requests During Hospitalization:  (UTA) Consults Spiritual Care Consult Needed: No Social Work Consult Needed: No Regulatory affairs officer (For Healthcare) Does patient have an advance directive?:  (UTA)    Additional Information 1:1 In Past 12 Months?: No CIRT Risk: No Elopement Risk: No Does patient have medical clearance?: Yes     Disposition:  Disposition Initial Assessment Completed for this Encounter: Yes Disposition of Patient: Other dispositions (observe overnight per Ricky Ala, FNP) Other disposition(s): Other (Comment)  On Site Evaluation by:   Reviewed with Physician:    Tishie Altmann 08/11/2015 6:55 PM

## 2015-08-11 NOTE — BH Assessment (Signed)
Assessment completed.  Consulted with Ricky Ala, FNP who recommends observation overnight and evaluating in the morning.   Rosalin Hawking, LCSW Therapeutic Triage Specialist [place] 08/11/2015 6:40 PM

## 2015-08-11 NOTE — ED Notes (Signed)
Bed: [facility] Expected date:  Expected time:  Means of arrival:  Comments: TR 6

## 2015-08-11 NOTE — ED Notes (Signed)
On admission to the [facility], pt stands at the nurse's station, staring. She is unable or unwilling to share her reason for being here. She accepted a soda and crackers.

## 2015-08-11 NOTE — ED Notes (Signed)
Attempted to draw labs - unsuccessful. Pt couldn't sit still.

## 2015-08-11 NOTE — ED Notes (Signed)
Phlebotomy was called to draw labs. 2x attempt and was unsuccessful.

## 2015-08-11 NOTE — ED Notes (Signed)
PT info me someone already attempted to get her blood and was unable therefore she would not allow me to collect labs. RN have been made aware

## 2015-08-11 NOTE — ED Notes (Signed)
IVC paperwork was attempted to have been served yesterday, but police state pt refused to open the door for them. IVC states: "RESPONDENT (H)AS BEEN DIAGNOSED WITH BI-POLAR, SCHIZOPHRENIA, AND DEPRESSION. RESPONDENT IS NOT TAKING PRESCRIBED MEDS ON A REFULAR BASIS. RESPONDENT CALLED POLICE SEVERAL TIMES STATING THAT THERE WERE DEAD BODIES IN HER APARTMENT AND THE [place]. POLICE CHECKED IT OUT AND FOUND NOTHING, BUT SHE CONTINUES TO CALL BACK WITH SAME STORY. RESPONDENT DOES NOT BELIEVE THE POLICE WHEN THEY TELL HER THAT THERE ARE NO BODIES." Per [employer], family states she has been not taking her meds over the last 5 months.

## 2015-08-11 NOTE — BH Assessment (Signed)
Patients hair removed with patient consent.  Patients hair given to nurse to be placed in patients belongings.   Rosalin Hawking, LCSW Therapeutic Triage Specialist [place] 08/11/2015 6:48 PM

## 2015-08-11 NOTE — ED Notes (Signed)
Patient appears flat, withdrawn. Does not make eye contact with Probation officer.

## 2015-08-12 DIAGNOSIS — F250 Schizoaffective disorder, bipolar type: Secondary | ICD-10-CM | POA: Diagnosis not present

## 2015-08-12 MED ORDER — OLANZAPINE 10 MG PO TBDP: 10.0000 mg | ORAL_TABLET | Freq: Three times a day (TID) | ORAL | Status: DC | PRN

## 2015-08-12 MED ORDER — PERPHENAZINE 4 MG PO TABS: 4.0000 mg | ORAL_TABLET | Freq: Two times a day (BID) | ORAL | Status: DC

## 2015-08-12 MED ORDER — HALOPERIDOL 5 MG PO TABS
5.0000 mg | ORAL_TABLET | Freq: Two times a day (BID) | ORAL | Status: DC
  Administered 2015-08-13: 5 mg via ORAL
  Filled 2015-08-12 (×3): qty 1

## 2015-08-12 MED ORDER — BENZTROPINE MESYLATE 1 MG PO TABS: 1.0000 mg | ORAL_TABLET | Freq: Two times a day (BID) | ORAL | Status: DC | PRN

## 2015-08-12 MED ORDER — QUETIAPINE FUMARATE 100 MG PO TABS
100.0000 mg | ORAL_TABLET | Freq: Every day | ORAL | Status: DC
Start: 2015-08-12 — End: 2015-08-14
  Filled 2015-08-12: qty 1

## 2015-08-12 NOTE — Consult Note (Signed)
[facility] Face-to-Face Psychiatry Consult   Reason for Consult:  Brought in by police for repeated contact with them Referring Physician:  ED MD Patient Identification: [name] MRN:  [id number] Principal Diagnosis: Schizoaffective disorder, bipolar type [facility]) Diagnosis:   Patient Active Problem List   Diagnosis Date Noted  . Abnormal behavior [R68.89]   . Involuntary commitment [Z04.6]   . Schizophrenia ([place]) [F20.9]   . Disturbance in affect ([place]) [F39]   . Tobacco use disorder [F17.200] 05/12/2015  . Cannabis use disorder, moderate, dependence ([place]) [F12.20] 05/12/2015  . Hypertension [I10] 02/24/2011  . ANEMIA-IRON DEFICIENCY [D50.9] 12/13/2006  . GERD [K21.9] 12/13/2006  . OBESITY [E66.9] 12/12/2006  . Schizoaffective disorder, bipolar type ([place]) [F25.0] 12/12/2006  . HEMORRHOIDS, NOS [K64.9] 12/12/2006  . PAIN-NECK [M54.2] 12/12/2006  . TREMOR [R25.9] 12/12/2006    Total Time spent with patient: 20 minutes  Subjective:   [name] is a 45 y.o. female patient admitted with repeated calls to police that there are dead bodies in her apartment. They come out to her place and find no bodies but she does not believe them and calls them back.  The eventually petitioned for commitment and brought her here.  She refuses to look at [place] or to talk to [place].  She has had at least 5 previous admissions to [facility] including the last one in 2017.  She has been diagnosed with schizoaffective disorder bipolar type in the past.  HPI:  No history related as she refused to talk.  See "subjective" above.  Past Psychiatric History: 5 previous hospitalizations for schizoaffective disorder with outpatient treatment between times  Risk to Self: Suicidal Ideation:  (UTA) Suicidal Intent:  (UTA) Is patient at risk for suicide?:  (UTA) Suicidal Plan?:  (UTA) Access to Means:  (UTA) What has been your use of drugs/alcohol within the last 12 months?:  (UTA) How many times?:  (UTA) Other  Self Harm Risks:  (UTA) Triggers for Past Attempts:  (UTA) Intentional Self Injurious Behavior:  (UTA) Risk to Others: Homicidal Ideation:  (UTA) Thoughts of Harm to Others:  (UTA) Current Homicidal Intent:  (UTA) Current Homicidal Plan:  (UTA) Access to Homicidal Means:  (UTA) Identified Victim:  (UTA) History of harm to others?:  (UTA) Assessment of Violence:  (UTA) Violent Behavior Description:  (UTA) Does patient have access to weapons?:  (UTA) Criminal Charges Pending?:  (UTA) Does patient have a court date:  Special educational needs teacher) Prior Inpatient Therapy: Prior Inpatient Therapy: Yes Prior Outpatient Therapy: Prior Outpatient Therapy:  (UTA) Prior Therapy Dates:  (UTA) Prior Therapy Facilty/Provider(s):  (UTA) Reason for Treatment:  (UTA) Does patient have an ACCT team?:  (UTA) Does patient have Intensive In-House Services?  :  (UTA) Does patient have [facility] services? :  (UTA) Does patient have P4CC services?:  (UTA)  Past Medical History:  Past Medical History  Diagnosis Date  . Depression   . Bipolar 1 disorder ([place])   . Hemorrhoid   . Transfusion history   . Schizophrenia ([place])     Pt denies and reports it is schizoaffective disorder.   . Arthritis   . Bronchitis   . Schizoaffective disorder [facility])     Past Surgical History  Procedure Laterality Date  . Cesarean section     Family History:  Family History  Problem Relation Age of Onset  . Diabetes type II Other    Family Psychiatric  History: none related Social History:  History  Alcohol Use No  History  Drug Use No    Comment: Former user    Social History   Social History  . Marital Status: Legally Separated    Spouse Name: N/A  . Number of Children: N/A  . Years of Education: N/A   Social History Main Topics  . Smoking status: Current Every Day Smoker -- 0.50 packs/day for 17 years    Types: Cigarettes  . Smokeless tobacco: Never Used  . Alcohol Use: No  . Drug Use: No     Comment: Former user  .  Sexual Activity: Not Asked   Other Topics Concern  . None   Social History Narrative   ** Merged History Encounter **       Additional Social History:    Allergies:   Allergies  Allergen Reactions  . Latuda [Lurasidone Hcl] Nausea Only    shaking  . Flagyl [Metronidazole Hcl] Nausea Only  . Sulfa Antibiotics Nausea Only    nausea    Labs:  Results for orders placed or performed during the hospital encounter of 08/11/15 (from the past 48 hour(s))  Comprehensive metabolic panel     Status: Abnormal   Collection Time: 08/11/15  3:59 PM  Result Value Ref Range   Sodium 141 135 - 145 mmol/L   Potassium 4.1 3.5 - 5.1 mmol/L   Chloride 108 101 - 111 mmol/L   CO2 24 22 - 32 mmol/L   Glucose, Bld 101 (H) 65 - 99 mg/dL   BUN 7 6 - 20 mg/dL   Creatinine, Ser 0.77 0.44 - 1.00 mg/dL   Calcium 9.6 8.9 - 10.3 mg/dL   Total Protein 8.5 (H) 6.5 - 8.1 g/dL   Albumin 4.4 3.5 - 5.0 g/dL   AST 16 15 - 41 U/L   ALT 11 (L) 14 - 54 U/L   Alkaline Phosphatase 66 38 - 126 U/L   Total Bilirubin 0.7 0.3 - 1.2 mg/dL   GFR calc non Af Amer >60 >60 mL/min   GFR calc Af Amer >60 >60 mL/min    Comment: (NOTE) The eGFR has been calculated using the CKD EPI equation. This calculation has not been validated in all clinical situations. eGFR's persistently <60 mL/min signify possible Chronic Kidney Disease.    Anion gap 9 5 - 15  Ethanol (ETOH)     Status: None   Collection Time: 08/11/15  3:59 PM  Result Value Ref Range   Alcohol, Ethyl (B) <5 <5 mg/dL    Comment:        LOWEST DETECTABLE LIMIT FOR SERUM ALCOHOL IS 5 mg/dL FOR MEDICAL PURPOSES ONLY   Salicylate level     Status: None   Collection Time: 08/11/15  3:59 PM  Result Value Ref Range   Salicylate Lvl <4.2 2.8 - 30.0 mg/dL  Acetaminophen level     Status: Abnormal   Collection Time: 08/11/15  3:59 PM  Result Value Ref Range   Acetaminophen (Tylenol), Serum <10 (L) 10 - 30 ug/mL    Comment:        THERAPEUTIC CONCENTRATIONS  VARY SIGNIFICANTLY. A RANGE OF 10-30 ug/mL MAY BE AN EFFECTIVE CONCENTRATION FOR MANY PATIENTS. HOWEVER, SOME ARE BEST TREATED AT CONCENTRATIONS OUTSIDE THIS RANGE. ACETAMINOPHEN CONCENTRATIONS >150 ug/mL AT 4 HOURS AFTER INGESTION AND >50 ug/mL AT 12 HOURS AFTER INGESTION ARE OFTEN ASSOCIATED WITH TOXIC REACTIONS.   CBC     Status: Abnormal   Collection Time: 08/11/15  3:59 PM  Result Value Ref Range   WBC 11.4 (  H) 4.0 - 10.5 K/uL   RBC 3.91 3.87 - 5.11 MIL/uL   Hemoglobin 9.3 (L) 12.0 - 15.0 g/dL   HCT 29.3 (L) 36.0 - 46.0 %   MCV 74.9 (L) 78.0 - 100.0 fL   MCH 23.8 (L) 26.0 - 34.0 pg   MCHC 31.7 30.0 - 36.0 g/dL   RDW 18.6 (H) 11.5 - 15.5 %   Platelets 419 (H) 150 - 400 K/uL    Current Facility-Administered Medications  Medication Dose Route Frequency Provider Last Rate Last Dose  . acetaminophen (TYLENOL) tablet 650 mg  650 mg Oral Q6H PRN Gareth Morgan, MD      . benztropine (COGENTIN) tablet 1 mg  1 mg Oral BID PRN Corena Pilgrim, MD      . haloperidol (HALDOL) tablet 5 mg  5 mg Oral BID Mojeed Akintayo, MD      . OLANZapine zydis (ZYPREXA) disintegrating tablet 10 mg  10 mg Oral Q8H PRN Mojeed Akintayo, MD      . QUEtiapine (SEROQUEL) tablet 100 mg  100 mg Oral QHS Corena Pilgrim, MD       Current Outpatient Prescriptions  Medication Sig Dispense Refill  . amantadine (SYMMETREL) 100 MG capsule Take 1 capsule (100 mg total) by mouth 2 (two) times daily. For prevention of drug induced tremors (Patient not taking: Reported on 06/11/2015) 60 capsule 0  . amantadine (SYMMETREL) 100 MG capsule Take 1 capsule (100 mg total) by mouth 2 (two) times daily. (Patient not taking: Reported on 08/11/2015) 60 capsule 0  . benztropine (COGENTIN) 1 MG tablet Take 1 tablet (1 mg total) by mouth 2 (two) times daily as needed for tremors. (Patient not taking: Reported on 08/11/2015) 30 tablet 0  . nicotine polacrilex (NICORETTE) 2 MG gum Take 1 each (2 mg total) by mouth as needed for  smoking cessation. (Patient not taking: Reported on 06/11/2015) 100 tablet 0  . perphenazine (TRILAFON) 4 MG tablet Take 1 tablet (4 mg total) by mouth 2 (two) times daily. (Patient not taking: Reported on 08/11/2015) 60 tablet 0  . perphenazine (TRILAFON) 4 MG tablet Take 1 tablet (4 mg total) by mouth 2 (two) times daily. (Patient not taking: Reported on 08/11/2015) 60 tablet 0  . pyridOXINE (VITAMIN B-6) 100 MG tablet Take 1 tablet (100 mg total) by mouth daily. For low B-6 Vitamin (Patient not taking: Reported on 06/11/2015)    . QUEtiapine (SEROQUEL) 100 MG tablet Take 100 mg by mouth 2 (two) times daily. Reported on 08/11/2015    . QUEtiapine (SEROQUEL) 100 MG tablet Take 1 tablet (100 mg total) by mouth at bedtime. (Patient not taking: Reported on 08/11/2015) 30 tablet 0  . traZODone (DESYREL) 100 MG tablet Take 2 tablets (200 mg total) by mouth at bedtime. (Patient not taking: Reported on 06/11/2015) 30 tablet 0  . traZODone (DESYREL) 100 MG tablet Take 2 tablets (200 mg total) by mouth at bedtime. (Patient not taking: Reported on 08/11/2015) 60 tablet 0    Musculoskeletal: Strength & Muscle Tone: in bed covered up and refusing to talk [place]: refusing to cooperate Patient leans: will not stand  Psychiatric Specialty Exam: Review of Systems  Unable to perform ROS: other    Blood pressure 120/72, pulse 72, temperature 98.5 F (36.9 C), temperature source Oral, resp. rate 18, SpO2 100 %.There is no weight on file to calculate BMI.  General Appearance: Disheveled  Eye Contact::  Absent  Speech:  refused to talk  Volume:  refused to  talk  Mood:  refused to look at [place]  Affect:  Blunt  Thought Process:  no talking  Orientation:  Other:  refuses to talk  Thought Content:  refuses to talk  Suicidal Thoughts:  refuses to talk  Homicidal Thoughts:  refuses to talk  Memory:  no talk  Judgement:  Other:  no talk  Insight:  no talk  Psychomotor Activity:  no cooperation  Concentration:   refused to cooperate  Recall:  refused  Fund of Knowledge:uncooperative  Language: no talk  Akathisia:  Negative  Handed:  Right  AIMS (if indicated):     Assets:  Others:  uncooperative  ADL's:  Intact  Cognition: WNL  Sleep:      Treatment Plan Summary: Daily contact with patient to assess and evaluate symptoms and progress in treatment, Medication management and Plan seek inpatient bed for treatment of psychosis  Disposition: Recommend psychiatric Inpatient admission when medically cleared.  Donnelly Angelica, MD 08/12/2015 1:48 PM

## 2015-08-12 NOTE — ED Notes (Signed)
Report received from [place], [place]. Patient is observed resting quietly in bed, respirations even and unlabored, color satisfactory. Patient appears in no distress.  Will continue to monitor for safety.  Q15 minute checks continue.

## 2015-08-12 NOTE — ED Notes (Signed)
Pt has been nonverbal, sleeping all day.  She has eaten her food but has not been up to the bathroom.  15 minute checks and video monitoring continue.

## 2015-08-13 NOTE — Progress Notes (Signed)
Patient continues to decline scheduled medications. States "I don't take medicine". Bizarre in behavior, delayed in verbalization.

## 2015-08-13 NOTE — ED Notes (Signed)
Patient states she does not do medications when asked about her scheduled Haldol and Seroquel. Appears bizarre, disheveled. Stares blankly and is delayed in answering questions.   Encouragement offered. Snacks provided.  Q 15 safety checks continue.

## 2015-08-13 NOTE — ED Notes (Signed)
Pt is selectively mute, but cooperative. She took her medication this morning. She answers simple questions with simple answers. She is calm and cooperative. Mostly she stays in bed sleeping.

## 2015-08-14 ENCOUNTER — Emergency Department (HOSPITAL_COMMUNITY)
Admission: EM | Admit: 2015-08-14 | Discharge: 2015-08-15 | Discharge status: Against Medical Advice | Payer: Medicaid Other | Source: Home / Self Care | Attending: Emergency Medicine | Admitting: Emergency Medicine

## 2015-08-14 DIAGNOSIS — M1990 Unspecified osteoarthritis, unspecified site: Secondary | ICD-10-CM

## 2015-08-14 DIAGNOSIS — F17210 Nicotine dependence, cigarettes, uncomplicated: Secondary | ICD-10-CM | POA: Insufficient documentation

## 2015-08-14 DIAGNOSIS — F600 Paranoid personality disorder: Secondary | ICD-10-CM | POA: Insufficient documentation

## 2015-08-14 DIAGNOSIS — Z79899 Other long term (current) drug therapy: Secondary | ICD-10-CM | POA: Insufficient documentation

## 2015-08-14 DIAGNOSIS — R4689 Other symptoms and signs involving appearance and behavior: Secondary | ICD-10-CM

## 2015-08-14 DIAGNOSIS — R6889 Other general symptoms and signs: Secondary | ICD-10-CM

## 2015-08-14 DIAGNOSIS — Z046 Encounter for general psychiatric examination, requested by authority: Secondary | ICD-10-CM | POA: Diagnosis not present

## 2015-08-14 DIAGNOSIS — F329 Major depressive disorder, single episode, unspecified: Secondary | ICD-10-CM | POA: Insufficient documentation

## 2015-08-14 DIAGNOSIS — F250 Schizoaffective disorder, bipolar type: Secondary | ICD-10-CM | POA: Diagnosis not present

## 2015-08-14 MED ORDER — HALOPERIDOL 5 MG PO TABS: 5.0000 mg | ORAL_TABLET | Freq: Two times a day (BID) | ORAL | Status: DC

## 2015-08-14 MED ORDER — BENZTROPINE MESYLATE 1 MG PO TABS: 1.0000 mg | ORAL_TABLET | Freq: Two times a day (BID) | ORAL | Status: DC

## 2015-08-14 MED ORDER — QUETIAPINE FUMARATE 100 MG PO TABS: 100.0000 mg | ORAL_TABLET | Freq: Every day | ORAL | Status: DC

## 2015-08-14 NOTE — BH Assessment (Signed)
08/14/2015  Per Dr. [name] and Reginold Agent, NP patient discharge home was recommended. Patient offered outpatient referrals and/or appointment to a provider of her choice. Patient ignored this Probation officer and walked out the room. Patient refused to allow this writer to provider her with follow-up referrals for outpatient treatment. Writer provided patients nurse with a list of referrals to attach to her discharge summary.

## 2015-08-14 NOTE — BHH Suicide Risk Assessment (Cosign Needed)
Suicide Risk Assessment  Discharge Assessment   [facility] Discharge Suicide Risk Assessment   Principal Problem: Schizoaffective disorder, bipolar type [facility]) Discharge Diagnoses:  Patient Active Problem List   Diagnosis Date Noted  . Schizoaffective disorder, bipolar type ([place]) [F25.0] 12/12/2006    Priority: Medium  . Abnormal behavior [R68.89]   . Involuntary commitment [Z04.6]   . Schizophrenia ([place]) [F20.9]   . Disturbance in affect ([place]) [F39]   . Tobacco use disorder [F17.200] 05/12/2015  . Cannabis use disorder, moderate, dependence ([place]) [F12.20] 05/12/2015  . Hypertension [I10] 02/24/2011  . ANEMIA-IRON DEFICIENCY [D50.9] 12/13/2006  . GERD [K21.9] 12/13/2006  . OBESITY [E66.9] 12/12/2006  . HEMORRHOIDS, NOS [K64.9] 12/12/2006  . PAIN-NECK [M54.2] 12/12/2006  . TREMOR [R25.9] 12/12/2006    Total Time spent with patient: 20 minutes  Musculoskeletal: Strength & Muscle Tone: within normal limits Gait & Station: normal Patient leans: N/A  Psychiatric Specialty Exam:   Blood pressure 112/62, pulse 68, temperature 98.3 F (36.8 C), temperature source Oral, resp. rate 18, SpO2 100 %.There is no weight on file to calculate BMI.  General Appearance: Casual  Eye Contact:: Minimal  Speech: Clear and Coherent and Normal Rate  Volume: Normal  Mood: Dysphoric  Affect: Congruent  Thought Process: Coherent  Orientation: Full (Time, Place, and Person)  Thought Content: WDL  Suicidal Thoughts: No  Homicidal Thoughts: No  Memory: Immediate; Fair Recent; Fair Remote; Fair  Judgement: Fair  Insight: Shallow  Psychomotor Activity: Psychomotor Retardation  Concentration: Fair  Recall: [employer] of Knowledge: Fair  Language: Fair  Akathisia: No  Handed: Right  AIMS (if indicated):    Assets: Desire for Improvement  ADL's: Intact  Cognition: WNL         Mental Status Per Nursing Assessment::   On Admission:      Demographic Factors:  Low socioeconomic status, Living alone and Unemployed  Loss Factors: NA  Historical Factors: NA  Risk Reduction Factors:   Positive social support  Continued Clinical Symptoms:  Depression:   Insomnia Schizophrenia:   Depressive state  Cognitive Features That Contribute To Risk:  Polarized thinking    Suicide Risk:  Minimal: No identifiable suicidal ideation.  Patients presenting with no risk factors but with morbid ruminations; may be classified as minimal risk based on the severity of the depressive symptoms    Plan Of Care/Follow-up recommendations:  Activity:  as tolerated Diet:  regular  Delfin Gant, NP   PMHNP-BC 08/14/2015, 11:18 AM

## 2015-08-14 NOTE — ED Notes (Addendum)
Pt discharged ambulatory.  All belongings were returned to pt., she was going to call her ACT team for a ride.  Prescriptions and discharge instructions were reviewed.  Pt refused vitals and refused to sign for her belongings.

## 2015-08-14 NOTE — ED Notes (Signed)
Patient refused vitals.

## 2015-08-14 NOTE — Consult Note (Signed)
Psychiatric Specialty Exam: Physical Exam  ROS  Blood pressure 112/62, pulse 68, temperature 98.3 F (36.8 C), temperature source Oral, resp. rate 18, SpO2 100 %.There is no weight on file to calculate BMI.  General Appearance: Casual  Eye Contact::  Minimal  Speech:  Clear and Coherent and Normal Rate  Volume:  Normal  Mood:  Dysphoric  Affect:  Congruent  Thought Process:  Coherent  Orientation:  Full (Time, Place, and Person)  Thought Content:  WDL  Suicidal Thoughts:  No  Homicidal Thoughts:  No  Memory:  Immediate;   Fair Recent;   Fair Remote;   Fair  Judgement:  Fair  Insight:  Shallow  Psychomotor Activity:  Psychomotor Retardation  Concentration:  Fair  Recall:  [place] of Knowledge:  Fair  Language:  Fair  Akathisia:  No  Handed:  Right  AIMS (if indicated):     Assets:  Desire for Improvement  ADL's:  Intact  Cognition:  WNL  Sleep:       Patient has not been taking her medications since her arrival.  Patient sleeps and eats and spends the whole day in her room watching TV. She denies SI/HI/AVH.  Patient has asked to be discharged home and this is her third visit in a month where she comes to sleep and not take her medications.    Patient does not speak much to staff and ignores answering some questions.  Patient is discharged home and will be referred to follow up with [facility]. Schizoaffective disorder, bipolar type ([name])   Plan: Discharge home.  [name]   PMHNP-BC Patient seen face-to-face for psychiatric evaluation, chart reviewed and case discussed with the physician extender and developed treatment plan. Reviewed the information documented and agree with the treatment plan. Patricia Pilgrim, MD

## 2015-08-14 NOTE — Progress Notes (Signed)
Continues to refuse vitals and medications.

## 2015-08-15 ENCOUNTER — Encounter (HOSPITAL_COMMUNITY): Payer: Self-pay | Admitting: Emergency Medicine

## 2015-08-15 MED ORDER — ACETAMINOPHEN 325 MG PO TABS: 650.0000 mg | ORAL_TABLET | ORAL | Status: DC | PRN

## 2015-08-15 MED ORDER — IBUPROFEN 200 MG PO TABS: 600.0000 mg | ORAL_TABLET | Freq: Three times a day (TID) | ORAL | Status: DC | PRN

## 2015-08-15 MED ORDER — ONDANSETRON HCL 4 MG PO TABS: 4.0000 mg | ORAL_TABLET | Freq: Three times a day (TID) | ORAL | Status: DC | PRN

## 2015-08-15 MED ORDER — LORAZEPAM 1 MG PO TABS: 0.0000 mg | ORAL_TABLET | Freq: Two times a day (BID) | ORAL | Status: DC

## 2015-08-15 MED ORDER — NICOTINE 21 MG/24HR TD PT24
21.0000 mg | MEDICATED_PATCH | Freq: Every day | TRANSDERMAL | Status: DC
Start: 2015-08-15 — End: 2015-08-15

## 2015-08-15 MED ORDER — LORAZEPAM 1 MG PO TABS: 0.0000 mg | ORAL_TABLET | Freq: Four times a day (QID) | ORAL | Status: DC

## 2015-08-15 NOTE — ED Provider Notes (Signed)
CSN: [id number]     Arrival date & time 08/14/15  2326 History   First MD Initiated Contact with Patient 08/15/15 0003     No chief complaint on file.  (Consider location/radiation/quality/duration/timing/severity/associated sxs/prior Treatment) HPI  Patient has past medical history of depression, bipolar 1 disorder, schizophrenia, schizoaffective disorder comes to the ER. She was discharged yesterday after being evaluated for similar symptoms. She does not take her Haldol and Seroquel like she supposed to. She is disheveled and acting bizarre. She is paranoid. She states that she does not want to tell anybody again why she is here. She states that she has a lot going on and plans to be admitted. Her chart review the patient has been calling the police saying that there is dead bodies in her home, please have looked through her house and have not seen any dead bodies. Patient says she feels better about people are following her when she is in the hospital and thought that she should come back.  The patient looks at me, says she does not trust me and refuses to answer anymore questions. Will not    Past Medical History  Diagnosis Date  . Depression   . Bipolar 1 disorder ([place])   . Hemorrhoid   . Transfusion history   . Schizophrenia ([place])     Pt denies and reports it is schizoaffective disorder.   . Arthritis   . Bronchitis   . Schizoaffective disorder [facility])    Past Surgical History  Procedure Laterality Date  . Cesarean section     Family History  Problem Relation Age of Onset  . Diabetes type II Other    Social History  Substance Use Topics  . Smoking status: Current Every Day Smoker -- 0.50 packs/day for 17 years    Types: Cigarettes  . Smokeless tobacco: Never Used  . Alcohol Use: No   OB History    No data available     Review of Systems  Review of Systems All other systems negative except as documented in the HPI. All pertinent positives and negatives as reviewed  in the HPI.   Allergies  Latuda; Flagyl; and Sulfa antibiotics  Home Medications   Prior to Admission medications   Medication Sig Start Date End Date Taking? Authorizing Provider  benztropine (COGENTIN) 1 MG tablet Take 1 tablet (1 mg total) by mouth 2 (two) times daily as needed for tremors. Patient not taking: Reported on 08/11/2015 08/01/15   Delfin Gant, NP  benztropine (COGENTIN) 1 MG tablet Take 1 tablet (1 mg total) by mouth 2 (two) times daily. 08/14/15   Delfin Gant, NP  haloperidol (HALDOL) 5 MG tablet Take 1 tablet (5 mg total) by mouth 2 (two) times daily. 08/14/15   Delfin Gant, NP  nicotine polacrilex (NICORETTE) 2 MG gum Take 1 each (2 mg total) by mouth as needed for smoking cessation. Patient not taking: Reported on 06/11/2015 05/20/15   Encarnacion Slates, NP  pyridOXINE (VITAMIN B-6) 100 MG tablet Take 1 tablet (100 mg total) by mouth daily. For low B-6 Vitamin Patient not taking: Reported on 06/11/2015 05/20/15   Encarnacion Slates, NP  QUEtiapine (SEROQUEL) 100 MG tablet Take 1 tablet (100 mg total) by mouth at bedtime. 08/14/15   Delfin Gant, NP   BP 152/82 mmHg  Pulse 79  Temp(Src) 98 F (36.7 C) (Oral)  Resp 15  SpO2 100%  LMP  (LMP Unknown) Physical Exam  Constitutional: She appears well-developed  and well-nourished. No distress.  HENT:  Head: Normocephalic and atraumatic.  Eyes: Pupils are equal, round, and reactive to light.  Neck: Normal range of motion. Neck supple.  Cardiovascular: Normal rate and regular rhythm.   Pulmonary/Chest: Effort normal.  Abdominal: Soft.  Neurological: She is alert.  Skin: Skin is warm and dry.  Psychiatric: She is actively hallucinating. Thought content is paranoid.  Will not answer if she is   Nursing note and vitals reviewed.   ED Course  Procedures (including critical care time) Labs Review Labs Reviewed - No data to display  Imaging Review No results found. I have personally reviewed and  evaluated these images and lab results as part of my medical decision-making.   EKG Interpretation None      MDM   Final diagnoses:  None   1:15 pm TTS consult ordered, labs not ordered as I do not believe that patient will be admitted inpatient. Will order labs later if requested.  Delos Haring, PA-C 08/15/15 0116  5:20 am The patient informed the nurse that she is leaving AMA. I was not made aware until patient left. She said that she is not HI/SI. A telepsych was ordered twice but as far as I understand it was never done.    Delos Haring, PA-C 08/15/15 [place], MD 08/15/15 [phone]

## 2015-08-15 NOTE — ED Notes (Signed)
Patient states "I don't feel well about things that are happening, so I thought I better come back." Patient believes people are following her, states "I feel better about people following me since I'm at the hospital." Denies SI/HI/AH.

## 2015-08-15 NOTE — ED Notes (Signed)
Pt walked out of the ED saying she wasn't going to sit in that room anymore

## 2015-08-20 ENCOUNTER — Emergency Department (HOSPITAL_COMMUNITY)
Admission: EM | Admit: 2015-08-20 | Discharge: 2015-08-21 | Disposition: A | Discharge status: Home / Self Care | Payer: Medicaid Other | Attending: Emergency Medicine | Admitting: Emergency Medicine

## 2015-08-20 DIAGNOSIS — M1990 Unspecified osteoarthritis, unspecified site: Secondary | ICD-10-CM | POA: Diagnosis not present

## 2015-08-20 DIAGNOSIS — R4689 Other symptoms and signs involving appearance and behavior: Secondary | ICD-10-CM

## 2015-08-20 DIAGNOSIS — F17210 Nicotine dependence, cigarettes, uncomplicated: Secondary | ICD-10-CM | POA: Diagnosis not present

## 2015-08-20 DIAGNOSIS — F319 Bipolar disorder, unspecified: Secondary | ICD-10-CM | POA: Insufficient documentation

## 2015-08-20 DIAGNOSIS — R451 Restlessness and agitation: Secondary | ICD-10-CM | POA: Diagnosis present

## 2015-08-20 DIAGNOSIS — F489 Nonpsychotic mental disorder, unspecified: Secondary | ICD-10-CM

## 2015-08-20 DIAGNOSIS — Z0389 Encounter for observation for other suspected diseases and conditions ruled out: Secondary | ICD-10-CM

## 2015-08-20 DIAGNOSIS — F209 Schizophrenia, unspecified: Secondary | ICD-10-CM | POA: Insufficient documentation

## 2015-08-20 DIAGNOSIS — Z79899 Other long term (current) drug therapy: Secondary | ICD-10-CM | POA: Diagnosis not present

## 2015-08-20 DIAGNOSIS — F250 Schizoaffective disorder, bipolar type: Secondary | ICD-10-CM | POA: Diagnosis not present

## 2015-08-20 DIAGNOSIS — F918 Other conduct disorders: Secondary | ICD-10-CM | POA: Diagnosis not present

## 2015-08-20 NOTE — ED Provider Notes (Signed)
CSN: [id number]     Arrival date & time 08/20/15  2153 History  By signing my name below, I, [facility], attest that this documentation has been prepared under the direction and in the presence of Sharlett Iles, MD. Electronically Signed: Virgel Bouquet, ED Scribe. 08/20/2015. 11:09 PM.   Chief Complaint  Patient presents with  . Medical Clearance   The history is provided by the patient. The history is limited by the condition of the patient. No language interpreter was used.   HPI Comments: [name] is a 45 y.o. female brought in by [employer] with an hx of depression, bipolar 1 disorder, schizophrenia, and schizoaffective disorder who presents to the Emergency Department for medical clearance. Per nursing note, pt presents from [facility] who did not have a bed available for her at this time.  Nurse from [facility] reported that patient presented there earlier today asking for her psych medications; she was instructed to go to pharmacy to pick them up. She left and returned this evening agitated and aggressive. The nurse reported that he was already tied up caring for another acutely agitated patient so the police present there escorted the patient here. During evaluation by nurse at [facility] earlier today, the patient had denied any SI or HI but is currently not taking her medications. In recent visits to ER, pt has endorsed visual hallucinations.  Pt states that "we know why she is here" and refuses to answer any other questions. Denies taking her prescribed medications at home.  LEVEL 5 CAVEAT DUE TO MENTAL ILLNESS  Past Medical History  Diagnosis Date  . Depression   . Bipolar 1 disorder ([place])   . Hemorrhoid   . Transfusion history   . Schizophrenia ([place])     Pt denies and reports it is schizoaffective disorder.   . Arthritis   . Bronchitis   . Schizoaffective disorder [facility])    Past Surgical History  Procedure Laterality Date  . Cesarean section     Family  History  Problem Relation Age of Onset  . Diabetes type II Other    Social History  Substance Use Topics  . Smoking status: Current Every Day Smoker -- 0.50 packs/day for 17 years    Types: Cigarettes  . Smokeless tobacco: Never Used  . Alcohol Use: No   OB History    No data available     Review of Systems  Unable to perform ROS: Psychiatric disorder     Allergies  Latuda; Flagyl; and Sulfa antibiotics  Home Medications   Prior to Admission medications   Medication Sig Start Date End Date Taking? Authorizing Provider  haloperidol (HALDOL) 5 MG tablet Take 1 tablet (5 mg total) by mouth 2 (two) times daily. 08/14/15  Yes Delfin Gant, NP  pyridOXINE (VITAMIN B-6) 100 MG tablet Take 1 tablet (100 mg total) by mouth daily. For low B-6 Vitamin 05/20/15  Yes Encarnacion Slates, NP  QUEtiapine (SEROQUEL) 100 MG tablet Take 1 tablet (100 mg total) by mouth at bedtime. 08/14/15  Yes Delfin Gant, NP  benztropine (COGENTIN) 1 MG tablet Take 1 tablet (1 mg total) by mouth 2 (two) times daily. 08/14/15   Delfin Gant, NP  nicotine polacrilex (NICORETTE) 2 MG gum Take 1 each (2 mg total) by mouth as needed for smoking cessation. Patient not taking: Reported on 06/11/2015 05/20/15   Encarnacion Slates, NP   BP 130/77 mmHg  Pulse 80  Temp(Src) 98.3 F (36.8 C) (Oral)  Resp 20  SpO2 100%  LMP  (LMP Unknown) Physical Exam  Constitutional: She appears well-developed and well-nourished.  Agitated, angry  HENT:  Head: Normocephalic and atraumatic.  Moist mucous membranes  Eyes: Conjunctivae are normal.  Neurological: She is alert.  Fluent speech, oriented to person and place, moving all 4 extremities  Skin: Skin is warm and dry. No rash noted.  Psychiatric:  Agitated, hostile, aggressive gestures; refusing to be compliant with exam  Nursing note and vitals reviewed.   ED Course  Procedures   DIAGNOSTIC STUDIES: Oxygen Saturation is 100% on R,  by my interpretation.     COORDINATION OF CARE: 11:03 PM Will speak with the charge nurse. Discussed treatment plan with pt at bedside and pt agreed to plan.   Labs Review Labs Reviewed  COMPREHENSIVE METABOLIC PANEL  ETHANOL  SALICYLATE LEVEL  ACETAMINOPHEN LEVEL  CBC  URINE RAPID DRUG SCREEN, HOSP PERFORMED  I-STAT BETA HCG BLOOD, ED ([facility], [facility], [facility] ONLY)     MDM   Final diagnoses:  None   Patient brought in by police from [facility] after she presented there twice today for her medications and later with aggressive behavior and hostility. Review of her chart shows multiple presentations over the past few weeks with aggressive behavior, patient has left AMA previously. On exam, she was awake and alert, hostile and refusing to answer most questions. She continually repeated "you know why I am here." Due to concern for personal safety, I was unable to perform full physical exam. Vital signs here are normal. Patient refused lab work. Contacted TTS for evaluation given her severe aggression and agitation. They have completed assessment and have recommended holding until the morning when psychiatrist can reevaluate the patient. Her disposition will be determined based on the recommendations of the psychiatrist.  I personally performed the services described in this documentation, which was scribed in my presence. The recorded information has been reviewed and is accurate.    Sharlett Iles, MD 08/21/15 [phone]

## 2015-08-20 NOTE — BH Assessment (Signed)
Tele Assessment Note   [name] is an 45 y.o. female presenting to [facility] accompanied by [employer]. It has been reported that pt was transported from [facility]. PT refused to participate in the TTS assessment and turned her back to this Probation officer when this writer attempted to engaged pt in the assessment. Per chart review pt has a history of depression, bipolar and schizophrenia. It has been documented that pt has been seen in the ED multiple times over the past several months with a similar presentation. It has been documented that pt has an ACT team and it has been documented that pt has been non-compliant with her medication.   Diagnosis: Schizophrenia   Past Medical History:  Past Medical History  Diagnosis Date  . Depression   . Bipolar 1 disorder ([place])   . Hemorrhoid   . Transfusion history   . Schizophrenia ([place])     Pt denies and reports it is schizoaffective disorder.   . Arthritis   . Bronchitis   . Schizoaffective disorder [facility])     Past Surgical History  Procedure Laterality Date  . Cesarean section      Family History:  Family History  Problem Relation Age of Onset  . Diabetes type II Other     Social History:  reports that she has been smoking Cigarettes.  She has a 8.5 pack-year smoking history. She has never used smokeless tobacco. She reports that she does not drink alcohol or use illicit drugs.  Additional Social History:  Alcohol / Drug Use History of alcohol / drug use?:  (Unable to assess)  CIWA: CIWA-Ar BP: 130/77 mmHg Pulse Rate: 80 COWS:    PATIENT STRENGTHS: (choose at least two) Motivation for treatment/growth ACT Team   Allergies:  Allergies  Allergen Reactions  . Latuda [Lurasidone Hcl] Nausea Only    shaking  . Flagyl [Metronidazole Hcl] Nausea Only  . Sulfa Antibiotics Nausea Only    nausea    Home Medications:  (Not in a hospital admission)  OB/GYN Status:  No LMP recorded (lmp unknown).  General Assessment Data Location of  Assessment: [facility] ED TTS Assessment: In system Is this a Tele or Face-to-Face Assessment?: Face-to-Face Is this an Initial Assessment or a Re-assessment for this encounter?: Initial Assessment Marital status: Single Maiden name:  (UTA) Is patient pregnant?:  (UTA) Pregnancy Status: Unable to assess Living Arrangements:  (UTA) Can pt return to current living arrangement?:  (UTA) Admission Status: Voluntary Is patient capable of signing voluntary admission?: Yes Referral Source: Self/Family/Friend Insurance type: Medicaid     Crisis Care Plan Living Arrangements:  (UTA) Name of Psychiatrist:  ([place]. Previous documentation reports [facility] Name of Therapist:  (UTA. Previous documentation reports [facility]. )  Education Status Is patient currently in school?:  (UTA)  Risk to self with the past 6 months Suicidal Ideation:  (UTA) Has patient been a risk to self within the past 6 months prior to admission? :  (UTA) Suicidal Intent:  (UTA) Has patient had any suicidal intent within the past 6 months prior to admission? :  (UTA.) Is patient at risk for suicide?:  (UTA) Suicidal Plan?:  (UTA) Has patient had any suicidal plan within the past 6 months prior to admission? :  (UTA) Access to Means:  (UTA) What has been your use of drugs/alcohol within the last 12 months?:  (UTA) Previous Attempts/Gestures:  (UTA) How many times?:  (UTA) Other Self Harm Risks:  (UTA) Intentional Self Injurious Behavior:  (UTA) Family Suicide History:  Unable to assess Recent stressful life event([name]):  (UTA) Persecutory voices/beliefs?:  [name]) Depression:  (UTA) Depression Symptoms:  (UTA) Substance abuse history and/or treatment for substance abuse?:  (UTA) Suicide prevention information given to non-admitted patients:  (UTA)  Risk to Others within the past 6 months Homicidal Ideation:  (UTA) Does patient have any lifetime risk of violence toward others beyond the six months prior to admission? :  Unknown Thoughts of Harm to Others:  (UTA') Current Homicidal Intent:  (UTA) Current Homicidal Plan:  (UTA) Access to Homicidal Means:  (UTA) Identified Victim:  (UTA) History of harm to others?:  (UTA) Assessment of Violence:  (UTA) Violent Behavior Description:  (UTA) Does patient have access to weapons?:  (UTA) Criminal Charges Pending?:  (UTA) Does patient have a court date:  (UTA) Is patient on probation?: Unknown  Psychosis Hallucinations:  (UTA) Delusions:  (UTA)  Mental Status Report Appearance/Hygiene: Unable to Assess Eye Contact: Poor Motor Activity: Unable to assess Speech: Argumentative Level of Consciousness: Irritable Mood: Irritable Affect: Blunted, Flat Anxiety Level:  (UTA) Thought Processes: Unable to Assess Judgement: Unable to Assess Orientation: Unable to assess Obsessive Compulsive Thoughts/Behaviors: Unable to Assess  Cognitive Functioning Concentration: Unable to Assess Memory: Unable to Assess IQ: Average Insight: Unable to Assess Impulse Control: Unable to Assess Appetite:  (UTA) Weight Loss:  (UTA) Weight Gain:  (UTA) Sleep: Unable to Assess Total Hours of Sleep:  (UTA) Vegetative Symptoms: Unable to Assess  ADLScreening [facility] Assessment Services) Patient'[name] cognitive ability adequate to safely complete daily activities?: Yes Patient able to express need for assistance with ADLs?: Yes Independently performs ADLs?: Yes (appropriate for developmental age)  Prior Inpatient Therapy Prior Inpatient Therapy: Yes Prior Therapy Dates: 2007, 2009, 2010, 2013, 2017 Prior Therapy Facilty/Provider([name]): [facility]  Reason for Treatment: Schizoaffective   Prior Outpatient Therapy Prior Outpatient Therapy: Yes Prior Therapy Facilty/Provider([name]): [facility] Reason for Treatment: unknown Does patient have an ACCT team?: Unknown Does patient have Intensive In-House Services?  : No Does patient have [facility] services? : Unknown Does patient have P4CC  services?: Unknown  ADL Screening (condition at time of admission) Patient'[name] cognitive ability adequate to safely complete daily activities?: Yes Is the patient deaf or have difficulty hearing?: No Does the patient have difficulty seeing, even when wearing glasses/contacts?: No Does the patient have difficulty concentrating, remembering, or making decisions?: Yes Patient able to express need for assistance with ADLs?: Yes Does the patient have difficulty dressing or bathing?: No Independently performs ADLs?: Yes (appropriate for developmental age)       Abuse/Neglect Assessment (Assessment to be complete while patient is alone) Physical Abuse:  (Unable to assess ) Verbal Abuse:  (unable to assess) Sexual Abuse:  (unable to assess) Exploitation of patient/patient'[name] resources:  (unable to assess) Self-Neglect:  (unable to assess)          Additional Information 1:1 In Past 12 Months?: Yes CIRT Risk: No Elopement Risk: No Does patient have medical clearance?: Yes     Disposition:  Disposition Initial Assessment Completed for this Encounter: Yes Disposition of Patient: Other dispositions Other disposition([name]): Other (Comment) (AM Psych eval )  [name] 08/20/2015 11:59 PM

## 2015-08-20 NOTE — ED Notes (Signed)
Pt refused blood work, Print production planner.

## 2015-08-20 NOTE — ED Notes (Signed)
Patient angry, yelling and cursing staff, sitter with patient.  Patient declining to have lab work drawn. Physically aggressive to staff.

## 2015-08-20 NOTE — ED Notes (Signed)
Pt refused to speaks to this recorder, refused blood draws.

## 2015-08-20 NOTE — ED Notes (Signed)
Attempts made to speak with patient regarding why she is here, pt only looks at Probation officer, with head tilted.

## 2015-08-20 NOTE — ED Notes (Signed)
Pt brought in by police from [facility]  Per police [facility] did not have any beds available  Pt is here voluntarily  Pt will not speak to this recorder as to why she is here or what she wants [place] to do for her  Unable to verify any medical history

## 2015-08-21 DIAGNOSIS — F250 Schizoaffective disorder, bipolar type: Secondary | ICD-10-CM

## 2015-08-21 DIAGNOSIS — Z0389 Encounter for observation for other suspected diseases and conditions ruled out: Secondary | ICD-10-CM | POA: Diagnosis not present

## 2015-08-21 DIAGNOSIS — F489 Nonpsychotic mental disorder, unspecified: Secondary | ICD-10-CM

## 2015-08-21 NOTE — ED Notes (Signed)
Pt escalating Pt yelling at staff Pt [place] room door.  Pt told that door must remain open. Security and Therapist, sports at bedside

## 2015-08-21 NOTE — Discharge Instructions (Signed)
For your ongoing behavioral health needs, you are advised to continue treatment with the [facility] ACT Team:       Psych[facility] Services ACT Team      [facility] [employer] [facility], [address]      [address]      [place], [place]  [address]      [phone]

## 2015-08-21 NOTE — ED Notes (Addendum)
Crisis here to pick up patient.  Pt again refusing discharge vitals.  Pt is voluntary and not IVC.  MD and NP aware pt refusing vitals throughout admit.  Pt began talking stating she does not want to go with them.  Pt asking why they called them.

## 2015-08-21 NOTE — BH Assessment (Addendum)
[place] Assessment Progress Note  Per Corena Pilgrim, MD, this pt does not require psychiatric hospitalization at this time.  Pt is to be discharged from [facility] with recommendation to continue treatment with the [facility] ACT Team, her outpatient provider.  At 11:00 this writer called [facility] ACT Team.  I was advised that pt does not currently have a place to live, and that typically they will not pick up a patient under these circumstances.  [facility] clinician advises me that he will call back after checking with his supervisor.  Dr [name] and Charmaine Downs, NP, have been notified.  Jalene Mullet, MA Triage Specialist [phone]    Addendum:  At 11:25 Jannifer Franklin calls back from the [facility] ACT Team.  He reports that pt's clinician, Beverlee Nims, will come to the ED to pick pt up at 14:00.  Dr [name] and pt's nurse have been notified.  Jalene Mullet, [place] Triage Specialist [phone]

## 2015-08-21 NOTE — Consult Note (Signed)
[place] Psychiatry Consult   Reason for Consult:  ager-Homelessness, medication non compliant Referring Physician:  EDP Patient Identification: [name] MRN:  [id number] Principal Diagnosis: No diagnosis on Axis I Diagnosis:   Patient Active Problem List   Diagnosis Date Noted  . No diagnosis on Axis I [Z03.89] 08/21/2015    Priority: High  . Schizoaffective disorder, bipolar type ([place]) [F25.0] 12/12/2006    Priority: Medium  . Abnormal behavior [R68.89]   . Involuntary commitment [Z04.6]   . Schizophrenia ([place]) [F20.9]   . Disturbance in affect ([place]) [F39]   . Tobacco use disorder [F17.200] 05/12/2015  . Cannabis use disorder, moderate, dependence ([place]) [F12.20] 05/12/2015  . Hypertension [I10] 02/24/2011  . ANEMIA-IRON DEFICIENCY [D50.9] 12/13/2006  . GERD [K21.9] 12/13/2006  . OBESITY [E66.9] 12/12/2006  . HEMORRHOIDS, NOS [K64.9] 12/12/2006  . PAIN-NECK [M54.2] 12/12/2006  . TREMOR [R25.9] 12/12/2006    Total Time spent with patient: 30 minutes  Subjective:   [name] is a 45 y.o. female patient admitted with ager-Homelessness, medication non compliant  HPI: Attempted to evaluate [name], 45 years old who was brought in by [employer] last night.  This is her 7th visit to the ER and one inpatient Psychiatric hospitalization.  Patient have refused to speak to anybody.   She has been sleeping and eating food since her arrival.  This is exactly the same last 3 presentations where she sleeps, eats and ask to be discharged after 4 days.  According to her ACT team staff, they cannot locate her as she is homeless and moves around.  When she is found she refuses medications.   Today she refused to answer any question from staff members including providers.  Her ACT team staff will come to the ER and take her.   She is discharged home.  Past Psychiatric History:  Schizoaffective disorder, Bipolar type,   Risk to Self: Suicidal Ideation:  (UTA) Suicidal Intent:   (UTA) Is patient at risk for suicide?:  (UTA) Suicidal Plan?:  (UTA) Access to Means:  (UTA) What has been your use of drugs/alcohol within the last 12 months?:  (UTA) How many times?:  (UTA) Other Self Harm Risks:  (UTA) Intentional Self Injurious Behavior:  (UTA) Risk to Others: Homicidal Ideation:  (UTA) Thoughts of Harm to Others:  (UTA') Current Homicidal Intent:  (UTA) Current Homicidal Plan:  (UTA) Access to Homicidal Means:  (UTA) Identified Victim:  (UTA) History of harm to others?:  (UTA) Assessment of Violence:  (UTA) Violent Behavior Description:  (UTA) Does patient have access to weapons?:  (UTA) Criminal Charges Pending?:  (UTA) Does patient have a court date:  (UTA) Prior Inpatient Therapy: Prior Inpatient Therapy: Yes Prior Therapy Dates: 2007, 2009, 2010, 2013, 2017 Prior Therapy Facilty/Provider(s): [facility]  Reason for Treatment: Schizoaffective  Prior Outpatient Therapy: Prior Outpatient Therapy: Yes Prior Therapy Facilty/Provider(s): [facility] Reason for Treatment: unknown Does patient have an ACCT team?: Unknown Does patient have Intensive In-House Services?  : No Does patient have [facility] services? : Unknown Does patient have P4CC services?: Unknown  Past Medical History:  Past Medical History  Diagnosis Date  . Depression   . Bipolar 1 disorder ([place])   . Hemorrhoid   . Transfusion history   . Schizophrenia ([place])     Pt denies and reports it is schizoaffective disorder.   . Arthritis   . Bronchitis   . Schizoaffective disorder [facility])     Past Surgical History  Procedure Laterality Date  .  Cesarean section     Family History:  Family History  Problem Relation Age of Onset  . Diabetes type II Other    Family Psychiatric  History:  Unable to obtain Social History:  History  Alcohol Use No     History  Drug Use No    Comment: Former user    Social History   Social History  . Marital Status: Legally Separated    Spouse Name: N/A  . Number  of Children: N/A  . Years of Education: N/A   Social History Main Topics  . Smoking status: Current Every Day Smoker -- 0.50 packs/day for 17 years    Types: Cigarettes  . Smokeless tobacco: Never Used  . Alcohol Use: No  . Drug Use: No     Comment: Former user  . Sexual Activity: Not on file   Other Topics Concern  . Not on file   Social History Narrative   ** Merged History Encounter **       Additional Social History:    Allergies:   Allergies  Allergen Reactions  . Latuda [Lurasidone Hcl] Nausea Only    shaking  . Flagyl [Metronidazole Hcl] Nausea Only  . Sulfa Antibiotics Nausea Only    nausea    Labs: No results found for this or any previous visit (from the past 48 hour(s)).  No current facility-administered medications for this encounter.   Current Outpatient Prescriptions  Medication Sig Dispense Refill  . haloperidol (HALDOL) 5 MG tablet Take 1 tablet (5 mg total) by mouth 2 (two) times daily. 60 tablet 0  . pyridOXINE (VITAMIN B-6) 100 MG tablet Take 1 tablet (100 mg total) by mouth daily. For low B-6 Vitamin    . QUEtiapine (SEROQUEL) 100 MG tablet Take 1 tablet (100 mg total) by mouth at bedtime. 30 tablet 0  . benztropine (COGENTIN) 1 MG tablet Take 1 tablet (1 mg total) by mouth 2 (two) times daily. 60 tablet 0  . nicotine polacrilex (NICORETTE) 2 MG gum Take 1 each (2 mg total) by mouth as needed for smoking cessation. (Patient not taking: Reported on 06/11/2015) 100 tablet 0    Musculoskeletal: Strength & Muscle Tone: within normal limits Gait & Station: normal Patient leans: N/A  Psychiatric Specialty Exam: Review of Systems  Unable to perform ROS: other    Blood pressure 130/77, pulse 80, temperature 98.3 F (36.8 C), temperature source Oral, resp. rate 20, SpO2 100 %.There is no weight on file to calculate BMI.  General Appearance: Casual and Well Groomed  Engineer, water::  None  Speech:  none, refused to speak to providers  Volume:  not  speaking to staff  Mood:  not speaking to staff  Affect:  unable to obtain  Thought Process:  unable to obtain, refuses to speak to staff  Orientation:  Other:  unable to speak  Thought Content:  Unable to obtain  Suicidal Thoughts:  unable to obtain, not speaking  Homicidal Thoughts:  unable to obtain  Memory:  Immediate;   unable to obtain Recent;   unable to obtain Remote;   unable to obtain  Judgement:  Poor  Insight:  Lacking  Psychomotor Activity:  Normal and sleeping since arrival  Concentration:  Fair  Recall:  unable to obtain, refuses to speak with provider  Fund of Knowledge:unable to obtain  Language: unable to obtain  Akathisia:  No  Handed:  Right  AIMS (if indicated):     Assets:  Housing  ADL's:  Intact  Cognition: WNL  Sleep:      Disposition:  Discharged home to follow up with her ACT team.  ACT team staff to pick up at 2 pm.  Delfin Gant, NP    PMHNP-BC 08/21/2015 11:04 AM Patient seen face-to-face for psychiatric evaluation, chart reviewed and case discussed with the physician extender and developed treatment plan. Reviewed the information documented and agree with the treatment plan. Corena Pilgrim, MD

## 2015-08-21 NOTE — ED Notes (Signed)
Spoke with mobile crisis, NP Reginold Agent, Designer, multimedia concerning patient.  Crisis supervisor also spoke to [place].  Orders were that if patient was willing to stay and have labs drawn than we could hold her overnight for further evaluation.  Pt is alert and oriented and refusing care.  Pt went to restroom and came out telling staff she was not staying.  Pt is not suicidal or homicidal and remains voluntary at this time.  Insurance risk surveyor.  Pt d/c home. No signature obtained.  Papers with follow up given to mobile crisis.  Pt left with her ACT team.

## 2015-08-21 NOTE — ED Notes (Signed)
Pt yelling, stating for ED staff/sitter to leaving the room. Security notified and responded.

## 2015-08-21 NOTE — ED Notes (Addendum)
Pt remains non talkative to staff.  To d/c with ACT team at 2pm. Refusing vitals

## 2015-08-21 NOTE — Progress Notes (Addendum)
Entered in d/c instructions  [facility] Schedule an appointment as soon as possible for a visit This is your assigned Medicaid [place] access doctor If you prefer to see another Medicaid doctor other than the one on your Medicaid card [place] [phone] or [phone] [facility]cp [facility] [address] [place] [phone]   medicaid patient [place]: [id number] [place], [place] [address] [email or web address] Use this website to assist with understanding your coverage & to renew application As a Medicaid client you MUST contact DSS/SSI each time you change address, move to another [place] or another state to keep your address updated  [place] Medicaid Transportation to

## 2015-08-21 NOTE — ED Notes (Signed)
PA laying in bed, Sitter at bedside

## 2015-08-21 NOTE — Progress Notes (Signed)
ED Cm attempted to call [facility] to verify last appt but office closed for lunch

## 2015-08-21 NOTE — [facility] Suicide Risk Assessment (Cosign Needed)
Suicide Risk Assessment  Discharge Assessment   [facility] Discharge Suicide Risk Assessment   Principal Problem: No diagnosis on Axis I Discharge Diagnoses:  Patient Active Problem List   Diagnosis Date Noted  . No diagnosis on Axis I [Z03.89] 08/21/2015    Priority: High  . Schizoaffective disorder, bipolar type ([place]) [F25.0] 12/12/2006    Priority: Medium  . Abnormal behavior [R68.89]   . Involuntary commitment [Z04.6]   . Schizophrenia ([place]) [F20.9]   . Disturbance in affect ([place]) [F39]   . Tobacco use disorder [F17.200] 05/12/2015  . Cannabis use disorder, moderate, dependence ([place]) [F12.20] 05/12/2015  . Hypertension [I10] 02/24/2011  . ANEMIA-IRON DEFICIENCY [D50.9] 12/13/2006  . GERD [K21.9] 12/13/2006  . OBESITY [E66.9] 12/12/2006  . HEMORRHOIDS, NOS [K64.9] 12/12/2006  . PAIN-NECK [M54.2] 12/12/2006  . TREMOR [R25.9] 12/12/2006    Total Time spent with patient: 20 minutes  Musculoskeletal: Strength & Muscle Tone: within normal limits Gait & Station: normal Patient leans: N/A  Psychiatric Specialty Exam:   Blood pressure 130/77, pulse 80, temperature 98.3 F (36.8 C), temperature source Oral, resp. rate 20, SpO2 100 %.There is no weight on file to calculate BMI.  General Appearance: Casual and Well Groomed  Engineer, water::  None  Speech:  none, refused to speak to providers  Volume:  not speaking to staff  Mood:  not speaking to staff  Affect:  unable to obtain  Thought Process:  unable to obtain, refuses to speak to staff  Orientation:  Other:  unable to speak  Thought Content:  Unable to obtain  Suicidal Thoughts:  unable to obtain, not speaking  Homicidal Thoughts:  unable to obtain  Memory:  Immediate;   unable to obtain Recent;   unable to obtain Remote;   unable to obtain  Judgement:  Poor  Insight:  Lacking  Psychomotor Activity:  Normal and sleeping since arrival  Concentration:  Fair  Recall:  unable to obtain, refuses to speak with provider  Fund of  Knowledge:unable to obtain  Language: unable to obtain  Akathisia:  No  Handed:  Right  AIMS (if indicated):     Assets:  Housing  ADL's:  Intact  Cognition: WNL     Mental Status Per Nursing Assessment::   On Admission:     Demographic Factors:  Low socioeconomic status, Living alone, Unemployed and homeless  Loss Factors: unable to obtain, not speaking to providers  Historical Factors: refused to answer questions  Risk Reduction Factors:   NA  Continued Clinical Symptoms:  Bipolar Disorder:   Depressive phase  Cognitive Features That Contribute To Risk:  Polarized thinking    Suicide Risk:  Minimal: No identifiable suicidal ideation.  Patients presenting with no risk factors but with morbid ruminations; may be classified as minimal risk based on the severity of the depressive symptoms    Plan Of Care/Follow-up recommendations:  Activity:  As tolerated Diet:  Regular.  Delfin Gant, NP   PMHNP-BC 08/21/2015, 11:55 AM

## 2015-08-23 ENCOUNTER — Emergency Department (HOSPITAL_COMMUNITY)
Admission: EM | Admit: 2015-08-23 | Discharge: 2015-08-23 | Discharge status: Against Medical Advice | Payer: Medicaid Other

## 2015-08-23 NOTE — ED Notes (Signed)
Pt ambulatory to triage room with steady gait. Pt went in room and sat in chair by the treatment room door. Requested pt sit in the exam chair, pt said she was not doing that. Discussed with pt that I would be obtaining her vital signs in triage. Pt said she "does not need that because I am here for depression." Explained ED process to her. Pt said she would not have her vitals done. I asked pt if she wanted to be evaluated by and ER physician, she said no. Then she asked about the "place across the street, you know behavioral health." I explained to pt again the process of the ED. Pt then continued to refuse to answer questions for this nurse, with strong eye contact and shaking her head no when asked questions.  RN left the room. Very shortly after, pt was standing at the nurses station asking for something to drink. Requested pt return to her room several times in attempts to redirect the pt. Pt continued to stand at the nurses station with persistent eye contact with staff.
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
## 2015-11-04 ENCOUNTER — Emergency Department (HOSPITAL_COMMUNITY)
Admission: EM | Admit: 2015-11-04 | Discharge: 2015-11-06 | Disposition: A | Discharge status: Home / Self Care | Payer: Medicaid Other | Attending: Emergency Medicine | Admitting: Emergency Medicine

## 2015-11-04 ENCOUNTER — Encounter (HOSPITAL_COMMUNITY): Payer: Self-pay | Admitting: Emergency Medicine

## 2015-11-04 DIAGNOSIS — Z79899 Other long term (current) drug therapy: Secondary | ICD-10-CM | POA: Diagnosis not present

## 2015-11-04 DIAGNOSIS — F209 Schizophrenia, unspecified: Secondary | ICD-10-CM | POA: Insufficient documentation

## 2015-11-04 DIAGNOSIS — R45851 Suicidal ideations: Secondary | ICD-10-CM | POA: Diagnosis present

## 2015-11-04 DIAGNOSIS — F319 Bipolar disorder, unspecified: Secondary | ICD-10-CM | POA: Insufficient documentation

## 2015-11-04 DIAGNOSIS — F250 Schizoaffective disorder, bipolar type: Secondary | ICD-10-CM | POA: Diagnosis present

## 2015-11-04 DIAGNOSIS — F1220 Cannabis dependence, uncomplicated: Secondary | ICD-10-CM

## 2015-11-04 DIAGNOSIS — M1990 Unspecified osteoarthritis, unspecified site: Secondary | ICD-10-CM | POA: Insufficient documentation

## 2015-11-04 DIAGNOSIS — F17210 Nicotine dependence, cigarettes, uncomplicated: Secondary | ICD-10-CM | POA: Diagnosis not present

## 2015-11-04 DIAGNOSIS — F329 Major depressive disorder, single episode, unspecified: Secondary | ICD-10-CM | POA: Insufficient documentation

## 2015-11-04 LAB — COMPREHENSIVE METABOLIC PANEL
ALBUMIN: 4.8 g/dL (ref 3.5–5.0)
ALT: 13 U/L — ABNORMAL LOW (ref 14–54)
ANION GAP: 8 (ref 5–15)
AST: 20 U/L (ref 15–41)
Alkaline Phosphatase: 69 U/L (ref 38–126)
BILIRUBIN TOTAL: 1.4 mg/dL — AB (ref 0.3–1.2)
BUN: 14 mg/dL (ref 6–20)
CO2: 23 mmol/L (ref 22–32)
Calcium: 9.3 mg/dL (ref 8.9–10.3)
Chloride: 105 mmol/L (ref 101–111)
Creatinine, Ser: 0.9 mg/dL (ref 0.44–1.00)
GFR calc Af Amer: >60 mL/min (ref >60)
Glucose, Bld: 93 mg/dL (ref 65–99)
POTASSIUM: 3.6 mmol/L (ref 3.5–5.1)
Sodium: 136 mmol/L (ref 135–145)
TOTAL PROTEIN: 9.3 g/dL — AB (ref 6.5–8.1)

## 2015-11-04 LAB — CBC
HCT: 27 % — ABNORMAL LOW (ref 36.0–46.0)
Hemoglobin: 8.7 g/dL — ABNORMAL LOW (ref 12.0–15.0)
MCH: 23.8 pg — ABNORMAL LOW (ref 26.0–34.0)
MCHC: 32.2 g/dL (ref 30.0–36.0)
MCV: 74 fL — ABNORMAL LOW (ref 78.0–100.0)
PLATELETS: 444 10*3/uL — AB (ref 150–400)
RBC: 3.65 MIL/uL — ABNORMAL LOW (ref 3.87–5.11)
RDW: 19 % — AB (ref 11.5–15.5)
WBC: 15.5 10*3/uL — AB (ref 4.0–10.5)

## 2015-11-04 LAB — SALICYLATE LEVEL

## 2015-11-04 LAB — RAPID URINE DRUG SCREEN, HOSP PERFORMED
Amphetamines: NOT DETECTED
BENZODIAZEPINES: NOT DETECTED
Barbiturates: NOT DETECTED
COCAINE: NOT DETECTED
Opiates: NOT DETECTED
Tetrahydrocannabinol: NOT DETECTED

## 2015-11-04 LAB — ETHANOL

## 2015-11-04 LAB — PREGNANCY, URINE: PREG TEST UR: NEGATIVE

## 2015-11-04 LAB — ACETAMINOPHEN LEVEL

## 2015-11-04 MED ORDER — HALOPERIDOL 5 MG PO TABS
5.0000 mg | ORAL_TABLET | Freq: Two times a day (BID) | ORAL | Status: DC
  Administered 2015-11-04 – 2015-11-05 (×3): 5 mg via ORAL
  Filled 2015-11-04 (×4): qty 1

## 2015-11-04 MED ORDER — BENZTROPINE MESYLATE 1 MG PO TABS
1.0000 mg | ORAL_TABLET | Freq: Two times a day (BID) | ORAL | Status: DC
  Administered 2015-11-04 – 2015-11-05 (×3): 1 mg via ORAL
  Filled 2015-11-04 (×4): qty 1

## 2015-11-04 MED ORDER — VITAMIN B-6 100 MG PO TABS
100.0000 mg | ORAL_TABLET | Freq: Every day | ORAL | Status: DC
  Administered 2015-11-04 – 2015-11-05 (×2): 100 mg via ORAL
  Filled 2015-11-04 (×3): qty 1

## 2015-11-04 NOTE — ED Notes (Signed)
Bed: [facility] Expected date:  Expected time:  Means of arrival:  Comments: EMS - suicidal

## 2015-11-04 NOTE — ED Notes (Signed)
Pt was informed about UA

## 2015-11-04 NOTE — ED Notes (Signed)
Pt given cup of water. Encouraged to drink PO fluids.

## 2015-11-04 NOTE — ED Provider Notes (Addendum)
CSN: [id number]     Arrival date & time 11/04/15  1446 History   First MD Initiated Contact with Patient 11/04/15 1459     Chief Complaint  Patient presents with  . Suicidal     (Consider location/radiation/quality/duration/timing/severity/associated sxs/prior Treatment) The history is provided by the patient and the EMS personnel. The history is limited by the condition of the patient.  Patient with hx depression/bipolar, presents via, with report that patient indicated she was very depressed with thoughts of suicide.  On arrival to ED, the patient appears depressed and tearful, but does not respond to questions - level 5 caveat.       Past Medical History  Diagnosis Date  . Depression   . Bipolar 1 disorder ([place])   . Hemorrhoid   . Transfusion history   . Schizophrenia ([place])     Pt denies and reports it is schizoaffective disorder.   . Arthritis   . Bronchitis   . Schizoaffective disorder [facility])    Past Surgical History  Procedure Laterality Date  . Cesarean section     Family History  Problem Relation Age of Onset  . Diabetes type II Other    Social History  Substance Use Topics  . Smoking status: Current Every Day Smoker -- 0.50 packs/day for 17 years    Types: Cigarettes  . Smokeless tobacco: Never Used  . Alcohol Use: No   OB History    No data available       Review of Systems  Unable to perform ROS: Psychiatric disorder  Patient will not answer questions/psychiatric illness  - level 5 caveat.       Allergies  Latuda; Flagyl; and Sulfa antibiotics  Home Medications   Prior to Admission medications   Medication Sig Start Date End Date Taking? Authorizing Provider  benztropine (COGENTIN) 1 MG tablet Take 1 tablet (1 mg total) by mouth 2 (two) times daily. 08/14/15   Delfin Gant, NP  haloperidol (HALDOL) 5 MG tablet Take 1 tablet (5 mg total) by mouth 2 (two) times daily. 08/14/15   Delfin Gant, NP  nicotine polacrilex (NICORETTE) 2 MG  gum Take 1 each (2 mg total) by mouth as needed for smoking cessation. Patient not taking: Reported on 06/11/2015 05/20/15   Encarnacion Slates, NP  pyridOXINE (VITAMIN B-6) 100 MG tablet Take 1 tablet (100 mg total) by mouth daily. For low B-6 Vitamin 05/20/15   Encarnacion Slates, NP  QUEtiapine (SEROQUEL) 100 MG tablet Take 1 tablet (100 mg total) by mouth at bedtime. 08/14/15   Delfin Gant, NP   BP 139/74 mmHg  Pulse 98  Temp(Src) 98.5 F (36.9 C) (Oral)  Resp 20  SpO2 98% Physical Exam  Constitutional: She appears well-developed and well-nourished.  tearful  HENT:  Head: Atraumatic.  Mouth/Throat: Oropharynx is clear and moist.  Eyes: Conjunctivae are normal. No scleral icterus.  Neck: Neck supple. No tracheal deviation present.  Cardiovascular: Normal rate, regular rhythm, normal heart sounds and intact distal pulses.   Pulmonary/Chest: Effort normal and breath sounds normal. No respiratory distress.  Abdominal: Soft. Normal appearance. She exhibits no distension. There is no tenderness.  Musculoskeletal: She exhibits no edema or tenderness.  Neurological: She is alert.  Alert appearing. Ambulated to room. Moves bil extremities purposefully.  Will not answer questions or respond verbally, currently.   Skin: Skin is warm and dry. No rash noted.  Psychiatric:  Depressed, tearful appearing.   Nursing note and vitals reviewed.  ED Course  Procedures (including critical care time) Labs Review   Results for orders placed or performed during the hospital encounter of 11/04/15  Comprehensive metabolic panel  Result Value Ref Range   Sodium 136 135 - 145 mmol/L   Potassium 3.6 3.5 - 5.1 mmol/L   Chloride 105 101 - 111 mmol/L   CO2 23 22 - 32 mmol/L   Glucose, Bld 93 65 - 99 mg/dL   BUN 14 6 - 20 mg/dL   Creatinine, Ser 0.90 0.44 - 1.00 mg/dL   Calcium 9.3 8.9 - 10.3 mg/dL   Total Protein 9.3 (H) 6.5 - 8.1 g/dL   Albumin 4.8 3.5 - 5.0 g/dL   AST 20 15 - 41 U/L   ALT 13 (L) 14  - 54 U/L   Alkaline Phosphatase 69 38 - 126 U/L   Total Bilirubin 1.4 (H) 0.3 - 1.2 mg/dL   GFR calc non Af Amer >60 >60 mL/min   GFR calc Af Amer >60 >60 mL/min   Anion gap 8 5 - 15  Ethanol  Result Value Ref Range   Alcohol, Ethyl (B) <5 <5 mg/dL  Salicylate level  Result Value Ref Range   Salicylate Lvl 123456 2.8 - 30.0 mg/dL  Acetaminophen level  Result Value Ref Range   Acetaminophen (Tylenol), Serum <10 (L) 10 - 30 ug/mL  cbc  Result Value Ref Range   WBC 15.5 (H) 4.0 - 10.5 K/uL   RBC 3.65 (L) 3.87 - 5.11 MIL/uL   Hemoglobin 8.7 (L) 12.0 - 15.0 g/dL   HCT 27.0 (L) 36.0 - 46.0 %   MCV 74.0 (L) 78.0 - 100.0 fL   MCH 23.8 (L) 26.0 - 34.0 pg   MCHC 32.2 30.0 - 36.0 g/dL   RDW 19.0 (H) 11.5 - 15.5 %   Platelets 444 (H) 150 - 400 K/uL  Rapid urine drug screen (hospital performed)  Result Value Ref Range   Opiates NONE DETECTED NONE DETECTED   Cocaine NONE DETECTED NONE DETECTED   Benzodiazepines NONE DETECTED NONE DETECTED   Amphetamines NONE DETECTED NONE DETECTED   Tetrahydrocannabinol NONE DETECTED NONE DETECTED   Barbiturates NONE DETECTED NONE DETECTED  Pregnancy, urine  Result Value Ref Range   Preg Test, Ur NEGATIVE NEGATIVE      I have personally reviewed and evaluated these lab results as part of my medical decision-making.    MDM   Labs set.  [place] team consulted.  Reviewed nursing notes and prior charts for additional history.   Recheck, pt alert, content. Awaiting bh eval.  Disposition per [facility] team.         Lajean Saver, MD 11/04/15 2139

## 2015-11-04 NOTE — ED Notes (Signed)
Pt now talking. Pt says she has abd pain and vaginal pain. Denies any discharge. Also asking for something to eat. Informed EDP.

## 2015-11-04 NOTE — ED Notes (Addendum)
Per EMS. Pt reports depression and SI. Was crying on truck and would not answer further questions with EMS. Pt will not answer questions in triage or nod/shake head in response to yes/no questions.

## 2015-11-04 NOTE — ED Notes (Signed)
MD at bedside. 

## 2015-11-04 NOTE — ED Notes (Signed)
Pt admitted to room #43. Pt selectively mute. Responding only to certain questions this nurse ask during nursing assessment. Poor eye contact noted. Pt reports "I broke down." Pt reporting increase in depression. Endorsing SI. Denies HI. Denies AVH. Pt tearful at times, thought blocking. Pt denies drug and alcohol use, urine specimen cup given for ordered UDS. Special checks q 15 mins in place for safety. Video monitoring in place.

## 2015-11-04 NOTE — BH Assessment (Addendum)
Assessment Note  [name] is an 45 y.o. female that presents this date although would not respond to this writer's questions. Patient would not open her eyes while in Triage, notes for the assessment where obtained from admission notes. The history is provided by the patient and the EMS personnel. The history is limited by the condition of the patient. Patient with hx depression/bipolar, presents via, with report that patient indicated she was very depressed with thoughts of suicide. On arrival to ED, the patient appears depressed and tearful, but does not respond to questions. Patient has multiple admission with the last one on 05/10/15 at [facility]. Note stated: "Although presented on admission as a poor historian, it was quite obvious that [facility] needed mood stabilization treatments to re-stabilize her worsening symptoms of Schizoaffective disorder. She was presenting with worsening symptoms of schizoaffective disorder". Case was staffed with Reita Cliche DNP who recommended an inpatient admission as appropriate bed placement is investigated.     Diagnosis: Schizoaffective disorder, bipolar type ([place]) per notes.  Past Medical History:  Past Medical History  Diagnosis Date  . Depression   . Bipolar 1 disorder ([place])   . Hemorrhoid   . Transfusion history   . Schizophrenia ([place])     Pt denies and reports it is schizoaffective disorder.   . Arthritis   . Bronchitis   . Schizoaffective disorder [facility])     Past Surgical History  Procedure Laterality Date  . Cesarean section      Family History:  Family History  Problem Relation Age of Onset  . Diabetes type II Other     Social History:  reports that she has been smoking Cigarettes.  She has a 8.5 pack-year smoking history. She has never used smokeless tobacco. She reports that she does not drink alcohol or use illicit drugs.  Additional Social History:  Alcohol / Drug Use Pain Medications: See MAR Prescriptions: See MAR Over the Counter:  See MAR History of alcohol / drug use?:  (UTA) Longest period of sobriety (when/how long):  (UTA) Withdrawal Symptoms:  (UTA)  CIWA: CIWA-Ar BP: 139/74 mmHg Pulse Rate: 98 COWS:    Allergies:  Allergies  Allergen Reactions  . Latuda [Lurasidone Hcl] Nausea Only    shaking  . Flagyl [Metronidazole Hcl] Nausea Only  . Sulfa Antibiotics Nausea Only    nausea    Home Medications:  (Not in a hospital admission)  OB/GYN Status:  No LMP recorded.  General Assessment Data [facility] of [facility] Assessment: In system Is this a Tele or Face-to-Face Assessment?: Face-to-Face Is this an Initial Assessment or a Re-assessment for this encounter?: Initial Assessment Marital status:  [name] Maiden name:  (NA) Is patient pregnant?: Unknown Pregnancy Status: Unknown Living Arrangements: Alone Can pt return to current living arrangement?:  (UTA) Admission Status: Voluntary (per admission notes) Is patient capable of signing voluntary admission?:  (UTA) Referral Source: Self/Family/Friend Insurance type: Unknown  Medical Screening Exam ([place]) Medical Exam completed: Yes  Crisis Care Plan Living Arrangements: Alone Legal Guardian: Other: (Unknown) Name of Psychiatrist: Unknown Name of Therapist: Unknown  Education Status Is patient currently in school?:  (UTA) Current Grade:  (UNknown) Highest grade of school patient has completed:  (Unknown) Name of school:  (NA) Contact person: NA  Risk to self with the past 6 months Suicidal Ideation: Yes-Currently Present (per admission notes) Has patient been a risk to self within the past 6 months prior to admission? :  (UTA) Suicidal Intent: Yes-Currently  Present (per admission notes) Has patient had any suicidal intent within the past 6 months prior to admission? :  (UTA) Is patient at risk for suicide?:  (UTA) Suicidal Plan?:  (UTA) Has patient had any suicidal plan within the past 6 months prior to admission?  :  (UTA) Access to Means:  (UTA) What has been your use of drugs/alcohol within the last 12 months?:  (UTA) Previous Attempts/Gestures: Yes (per notes) How many times?:  (UTA) Other Self Harm Risks:  (UTA) Triggers for Past Attempts: Unknown Intentional Self Injurious Behavior:  (UTA) Family Suicide History: Unable to assess Recent stressful life event(s):  (UTA) Persecutory voices/beliefs?:  [name] Depression:  (UTA) Depression Symptoms:  (UTA) Substance abuse history and/or treatment for substance abuse?:  (UTA) Suicide prevention information given to non-admitted patients: Not applicable  Risk to Others within the past 6 months Homicidal Ideation:  (UTA) Does patient have any lifetime risk of violence toward others beyond the six months prior to admission? :  (UTA) Thoughts of Harm to Others:  (UTA) Current Homicidal Intent:  (UTA) Current Homicidal Plan:  (UTA) Access to Homicidal Means:  (UTA) Identified Victim:  (UTA) History of harm to others?:  (Unknown) Assessment of Violence: None Noted Violent Behavior Description: Unknown Does patient have access to weapons?:  ([place]) Criminal Charges Pending?:  (UTA) Does patient have a court date:  (UTA) Is patient on probation?: Unknown  Psychosis Hallucinations:  (UTA) Delusions:  (UTA)  Mental Status Report Appearance/Hygiene: In scrubs Eye Contact: Poor Motor Activity: Freedom of movement Speech: Unable to assess Level of Consciousness: Unable to assess Mood: Depressed Affect: Depressed Anxiety Level:  (UTA) Thought Processes: Unable to Assess Judgement: Unable to Assess Orientation: Unable to assess Obsessive Compulsive Thoughts/Behaviors: Unable to Assess  Cognitive Functioning Concentration: Unable to Assess Memory: Unable to Assess IQ:  (UTA) Insight: Unable to Assess Impulse Control: Unable to Assess Appetite:  (UTA) Weight Loss:  (UTA) Weight Gain:  (UTA) Sleep: Unable to Assess Total Hours of Sleep:   (UTA) Vegetative Symptoms: Unable to Assess  ADLScreening [facility] Assessment Services) Patient's cognitive ability adequate to safely complete daily activities?: No Patient able to express need for assistance with ADLs?: No Independently performs ADLs?:  (UTA)  Prior Inpatient Therapy Prior Inpatient Therapy: Yes Prior Therapy Dates: 2017 Prior Therapy Facilty/Provider(s): [facility] Reason for Treatment: SI  Prior Outpatient Therapy Prior Outpatient Therapy:  (Unknown) Prior Therapy Dates:  (Unknown) Prior Therapy Facilty[facility]Provider([facility]): [facility] Reason for Treatment: SI, Depression Does patient have an ACCT team?: Unknown Does patient have Intensive In-House Services?  : Unknown Does patient have [facility] services? : Unknown Does patient have P4CC services?: Unknown  ADL Screening (condition at time of admission) Patient's cognitive ability adequate to safely complete daily activities?: No Is the patient deaf or have difficulty hearing?:  (UTA) Does the patient have difficulty seeing, even when wearing glasses/contacts?:  (UTA) Does the patient have difficulty concentrating, remembering, or making decisions?: Yes Patient able to express need for assistance with ADLs?: No Does the patient have difficulty dressing or bathing?:  (UTA) Independently performs ADLs?:  (UTA) Does the patient have difficulty walking or climbing stairs?:  (UTA) Weakness of Legs:  (UTA) Weakness of Arms/Hands:  (UTA)  Home Assistive Devices/Equipment Home Assistive Devices/Equipment:  (UTA)  Therapy Consults (therapy consults require a physician order) PT Evaluation Needed: No OT Evalulation Needed: No SLP Evaluation Needed: No Abuse/Neglect Assessment (Assessment to be complete while patient is alone) Physical Abuse:  (UTA) Verbal Abuse:  (UTA) Sexual Abuse:  (  UTA) Exploitation of patient/patient's resources:  (UTA) Self-Neglect:  (UTA) Possible abuse reported to::  (UTA) Values / Beliefs Cultural Requests  During Hospitalization:  (UTA) Spiritual Requests During Hospitalization:  (UTA) [place] Needed: No Social Work Consult Needed: No Regulatory affairs officer (For Healthcare) Does patient have an advance directive?: No Would patient like information on creating an advanced directive?: No - patient declined information (pt declined)    Additional Information 1:1 In Past 12 Months?: No CIRT Risk: No Elop[place] Risk: No Does patient have medical clearance?: Yes     Disposition: Case was staffed with Reita Cliche DNP who recommended an inpatient admission as appropriate bed placement is investigated. Disposition Initial Assessment Completed for this Encounter: Yes Disposition of Patient: Inpatient treatment program Type of inpatient treatment program: Adult  On Site Evaluation by:   Reviewed with Physician:    Mamie Nick 11/04/2015 4:15 PM

## 2015-11-04 NOTE — ED Notes (Signed)
Patient continues to refuse to give urine sample at this time.

## 2015-11-04 NOTE — ED Notes (Signed)
TTS at bedside. 

## 2015-11-05 DIAGNOSIS — R45851 Suicidal ideations: Secondary | ICD-10-CM

## 2015-11-05 DIAGNOSIS — F250 Schizoaffective disorder, bipolar type: Secondary | ICD-10-CM | POA: Diagnosis not present

## 2015-11-05 MED ORDER — QUETIAPINE FUMARATE 100 MG PO TABS
100.0000 mg | ORAL_TABLET | Freq: Every day | ORAL | Status: DC
  Administered 2015-11-05: 100 mg via ORAL
  Filled 2015-11-05: qty 1

## 2015-11-05 MED ORDER — HALOPERIDOL 5 MG PO TABS
5.0000 mg | ORAL_TABLET | Freq: Once | ORAL | Status: DC
  Filled 2015-11-05: qty 1

## 2015-11-05 MED ORDER — LORAZEPAM 1 MG PO TABS
2.0000 mg | ORAL_TABLET | Freq: Once | ORAL | Status: AC
  Administered 2015-11-05: 2 mg via ORAL
  Filled 2015-11-05: qty 2

## 2015-11-05 NOTE — BH Assessment (Addendum)
[facility] Assessment Progress Note  Per Corena Pilgrim, MD, this patient does not require psychiatric hospitalization at this time.  Pt is to be discharged from [facility] with recommendation to continue treatment with the [facility], her current outpatient provider.  At the request of Dr [name], this writer called [facility] to arrange for pick up and follow up.  Call was placed at 09:27, rolling to voice mail.  I left a message including both my contact information as well as Merry Proud, LCSW's [phone]).  As of this writing, call back is pending.  Contact information for [facility] has been included in pt's discharge instructions.  Pt's nurse, Caryl Pina, has been notified.  Jalene Mullet, MA Triage Specialist [phone]    Addendum:  Pt's nurse, Caryl Pina, reports that she received a call from [facility] with the [facility].  Lonn [place] reports that pt has not been taking her medications recently and that she is not willing to pick pt up at this time.  She asks to speak with psychiatry.  At 11:45 this writer called Waylan Boga, DNP, to report Kayla's concerns.  She agrees to retain pt overnight in the ED and re-start her on medications.  At 11:57 I called [facility] and spoke to [facility], notifying her of these developments.  Caryl Pina has also been updated.  Jalene Mullet, [place] Triage Specialist [phone]

## 2015-11-05 NOTE — ED Notes (Signed)
ACT Team member at bedside.

## 2015-11-05 NOTE — ED Notes (Signed)
Called [place],NP; no answer

## 2015-11-05 NOTE — ED Notes (Addendum)
Pt refused vitals on 11/05/15 at 6:45 am

## 2015-11-05 NOTE — ED Notes (Signed)
Patient reported that she left 400.00 money order at the hotel she was staying at. Patient wanted me to contact her act team and see if they could go by hotel to retrieve money order. Act team contacted at [phone] and they report to early in the morning but would pass information on. Patient informed and encouragement and support provided and safety maintain. Q 15 min checks remain in place.

## 2015-11-05 NOTE — [facility] Suicide Risk Assessment (Signed)
Suicide Risk Assessment  Discharge Assessment   [facility] Discharge Suicide Risk Assessment   Principal Problem: Schizoaffective disorder, bipolar type [facility]) Discharge Diagnoses:  Patient Active Problem List   Diagnosis Date Noted  . Schizoaffective disorder, bipolar type ([place]) [F25.0] 12/12/2006    Priority: High  . Cannabis use disorder, moderate, dependence ([place]) [F12.20] 05/12/2015    Priority: Low  . No diagnosis on Axis I [Z03.89] 08/21/2015  . Abnormal behavior [R68.89]   . Involuntary commitment [Z04.6]   . Schizophrenia ([place]) [F20.9]   . Disturbance in affect ([place]) [F39]   . Tobacco use disorder [F17.200] 05/12/2015  . Hypertension [I10] 02/24/2011  . ANEMIA-IRON DEFICIENCY [D50.9] 12/13/2006  . GERD [K21.9] 12/13/2006  . OBESITY [E66.9] 12/12/2006  . HEMORRHOIDS, NOS [K64.9] 12/12/2006  . PAIN-NECK [M54.2] 12/12/2006  . TREMOR [R25.9] 12/12/2006    Total Time spent with patient: 45 minutes  Strength & Muscle Tone: within normal limits Gait & Station: normal Patient leans: N/A  Psychiatric Specialty Exam: Physical Exam  Constitutional: She is oriented to person, place, and time. She appears well-developed and well-nourished.  HENT:  Head: Normocephalic.  Neck: Normal range of motion.  Respiratory: Effort normal.  Musculoskeletal: Normal range of motion.  Neurological: She is alert and oriented to person, place, and time.  Skin: Skin is warm and dry.  Psychiatric: Her speech is normal and behavior is normal. Judgment and thought content normal. Her affect is blunt. Cognition and memory are normal. She exhibits a depressed mood.    Review of Systems  Constitutional: Negative.   HENT: Negative.   Eyes: Negative.   Respiratory: Negative.   Cardiovascular: Negative.   Gastrointestinal: Negative.   Genitourinary: Negative.   Musculoskeletal: Negative.   Skin: Negative.   Neurological: Negative.   Endo/Heme/Allergies: Negative.   Psychiatric/Behavioral: Positive  for depression.    Blood pressure 105/52, pulse 67, temperature 97.7 F (36.5 C), temperature source Oral, resp. rate 16, SpO2 94 %.There is no weight on file to calculate BMI.  General Appearance: Casual  Eye Contact:  Good  Speech:  Normal Rate  Volume:  Normal  Mood:  Depressed, mild  Affect:  Blunt  Thought Process:  Coherent and Descriptions of Associations: Intact  Orientation:  Full (Time, Place, and Person)  Thought Content:  WDL  Suicidal Thoughts:  Yes.  without intent/plan  Homicidal Thoughts:  No  Memory:  Immediate;   Good Recent;   Good Remote;   Good  Judgement:  Good  Insight:  Fair  Psychomotor Activity:  Normal  Concentration:  Concentration: Good and Attention Span: Good  Recall:  Good  Fund of Knowledge:  Good  Language:  Good  Akathisia:  No  Handed:  Right  AIMS (if indicated):     Assets:  Housing Leisure Time Physical Health Resilience Social Support  ADL's:  Intact  Cognition:  WNL  Sleep:       Mental Status Per Nursing Assessment::   On Admission:   depression with suicidal ideations  Demographic Factors:  NA  Loss Factors: NA  Historical Factors: NA  Risk Reduction Factors:   Responsible for children under 3 years of age, Sense of responsibility to family, Positive social support and Positive therapeutic relationship  Continued Clinical Symptoms:  Depression, mild  Cognitive Features That Contribute To Risk:  None    Suicide Risk:  Minimal: No identifiable suicidal ideation.  Patients presenting with no risk factors but with morbid ruminations; may be classified as minimal risk based on the  severity of the depressive symptoms    Plan Of Care/Follow-up recommendations:  Activity:  as tolerated Diet:  heart healthy diet  Caidence Kaseman, NP 11/05/2015, 9:44 AM

## 2015-11-05 NOTE — ED Notes (Signed)
Pt selectively mute, forwards little with this nurse, uninterested. Pt endorsing chronic SI without plan. Donzetta Sprung NP and Dr. Loni Muse made aware. Pt for discharge with ACT team per MD order.

## 2015-11-05 NOTE — ED Notes (Signed)
ACT Team member forwards to this nurse that pt was stabilized and discharged from [facility] and  on 10/28/15

## 2015-11-05 NOTE — ED Notes (Addendum)
Updated by Manuella Ghazi, reports per Donzetta Sprung NP pt discharge canceled at this time.

## 2015-11-05 NOTE — ED Notes (Addendum)
Pt irritable on approach throughout the shift, selectively mute. Pt asking for multiple snacks. Pt counseled to keep room clean. Pt continues to leave half-eaten snacks and trash in room. Will not follow redirection to clean up; refusing to communicate with nursing staff when counseled to pick up. Special checks q 15 mins in place for safety. Video monitoring in place.

## 2015-11-05 NOTE — Progress Notes (Signed)
Entered in d/c instructions [facility] [address] [place] [phone] Schedule an appointment as soon as possible for a visit on 11/05/2015 This is your assigned Medicaid [place] access doctor If you prefer to see another Medicaid doctor other than the one on your Medicaid card [place] [phone] or [phone] pcp [facility] [address] [place] Oretta [place] access covered patient [place]: [place], [place] [address] [email or web address] Use this website to assist with understanding your coverage & to renew application As a Medicaid client you MUST contact DSS/SSI each time you change address, move to another [place] or another state to keep your address updated  [place] Medicaid Transportation to Dr appts if you are have full Medicaid: [phone], [phone]

## 2015-11-05 NOTE — Consult Note (Signed)
[facility] Consult   Reason for Consult:  Depression with suicidal ideations Referring Physician:  EDP Patient Identification: [name] MRN:  [id number] Principal Diagnosis: Schizoaffective disorder, bipolar type [facility]) Diagnosis:   Patient Active Problem List   Diagnosis Date Noted  . Schizoaffective disorder, bipolar type ([place]) [F25.0] 12/12/2006    Priority: High  . Cannabis use disorder, moderate, dependence ([place]) [F12.20] 05/12/2015    Priority: Low  . No diagnosis on Axis I [Z03.89] 08/21/2015  . Abnormal behavior [R68.89]   . Involuntary commitment [Z04.6]   . Schizophrenia ([place]) [F20.9]   . Disturbance in affect ([place]) [F39]   . Tobacco use disorder [F17.200] 05/12/2015  . Hypertension [I10] 02/24/2011  . ANEMIA-IRON DEFICIENCY [D50.9] 12/13/2006  . GERD [K21.9] 12/13/2006  . OBESITY [E66.9] 12/12/2006  . HEMORRHOIDS, NOS [K64.9] 12/12/2006  . PAIN-NECK [M54.2] 12/12/2006  . TREMOR [R25.9] 12/12/2006    Total Time spent with patient: 45 minutes  Subjective:   [name] is a 45 y.o. female patient does not warrant admission.  HPI:  On admission:  45 y.o. female that presents this date although would not respond to this writer's questions. Patient would not open her eyes while in Triage, notes for the assessment where obtained from admission notes. The history is provided by the patient and the EMS personnel. The history is limited by the condition of the patient. Patient with hx depression/bipolar, presents via, with report that patient indicated she was very depressed with thoughts of suicide. On arrival to ED, the patient appears depressed and tearful, but does not respond to questions. Patient has multiple admission with the last one on 05/10/15 at [facility]. Note stated: "Although presented on admission as a poor historian, it was quite obvious that [facility] needed mood stabilization treatments to re-stabilize her worsening symptoms of  Schizoaffective disorder. She was presenting with worsening symptoms of schizoaffective disorder". Case was staffed with Reita Cliche DNP who recommended an inpatient admission as appropriate bed placement is investigated.   Today:  Patient has some passive suicidal ideations but no plan or intent, no homicidal ideations or hallucinations, no alcohol/drug abuse.  She is well known to this ED and she frequently comes to sleep and relax while watching television in the day room to get a break from her every day life.  [facility] ACT team notified to come and get her, stable for discharge.  Past Psychiatric History: Schizoaffective disorder, cannabis abuse  Risk to Self: Suicidal Ideation: Yes-Currently Present (per admission notes) Suicidal Intent: Yes-Currently Present (per admission notes) Is patient at risk for suicide?:  (UTA) Suicidal Plan?:  (UTA) Access to Means:  (UTA) What has been your use of drugs/alcohol within the last 12 months?:  (UTA) How many times?:  (UTA) Other Self Harm Risks:  (UTA) Triggers for Past Attempts: Unknown Intentional Self Injurious Behavior:  (UTA) Risk to Others: Homicidal Ideation:  (UTA) Thoughts of Harm to Others:  (UTA) Current Homicidal Intent:  (UTA) Current Homicidal Plan:  (UTA) Access to Homicidal Means:  (UTA) Identified Victim:  (UTA) History of harm to others?:  (Unknown) Assessment of Violence: None Noted Violent Behavior Description: Unknown Does patient have access to weapons?:  (UTA) Criminal Charges Pending?:  (UTA) Does patient have a court date:  (UTA) Prior Inpatient Therapy: Prior Inpatient Therapy: Yes Prior Therapy Dates: 2017 Prior Therapy Facilty/Provider(s): [facility] Reason for Treatment: SI Prior Outpatient Therapy: Prior Outpatient Therapy:  (Unknown) Prior Therapy Dates:  (Unknown) Prior Therapy Facilty/Provider(s): [facility] Reason for Treatment: SI,  Depression Does patient have an ACCT team?: Unknown Does patient have Intensive In-House  Services?  : Unknown Does patient have [facility] services? : Unknown Does patient have P4CC services?: Unknown  Past Medical History:  Past Medical History  Diagnosis Date  . Depression   . Bipolar 1 disorder ([place])   . Hemorrhoid   . Transfusion history   . Schizophrenia ([place])     Pt denies and reports it is schizoaffective disorder.   . Arthritis   . Bronchitis   . Schizoaffective disorder [facility])     Past Surgical History  Procedure Laterality Date  . Cesarean section     Family History:  Family History  Problem Relation Age of Onset  . Diabetes type II Other    Family Psychiatric  History: None Social History:  History  Alcohol Use No     History  Drug Use No    Comment: Former user    Social History   Social History  . Marital Status: Legally Separated    Spouse Name: N/A  . Number of Children: N/A  . Years of Education: N/A   Social History Main Topics  . Smoking status: Current Every Day Smoker -- 0.50 packs/day for 17 years    Types: Cigarettes  . Smokeless tobacco: Never Used  . Alcohol Use: No  . Drug Use: No     Comment: Former user  . Sexual Activity: Not Asked   Other Topics Concern  . None   Social History Narrative   ** Merged History Encounter **       Additional Social History:    Allergies:   Allergies  Allergen Reactions  . Latuda [Lurasidone Hcl] Nausea Only    shaking  . Flagyl [Metronidazole Hcl] Nausea Only  . Sulfa Antibiotics Nausea Only    nausea    Labs:  Results for orders placed or performed during the hospital encounter of 11/04/15 (from the past 48 hour(s))  Comprehensive metabolic panel     Status: Abnormal   Collection Time: 11/04/15  3:50 PM  Result Value Ref Range   Sodium 136 135 - 145 mmol/L   Potassium 3.6 3.5 - 5.1 mmol/L   Chloride 105 101 - 111 mmol/L   CO2 23 22 - 32 mmol/L   Glucose, Bld 93 65 - 99 mg/dL   BUN 14 6 - 20 mg/dL   Creatinine, Ser 0.90 0.44 - 1.00 mg/dL   Calcium 9.3 8.9 - 10.3 mg/dL    Total Protein 9.3 (H) 6.5 - 8.1 g/dL   Albumin 4.8 3.5 - 5.0 g/dL   AST 20 15 - 41 U/L   ALT 13 (L) 14 - 54 U/L   Alkaline Phosphatase 69 38 - 126 U/L   Total Bilirubin 1.4 (H) 0.3 - 1.2 mg/dL   GFR calc non Af Amer >60 >60 mL/min   GFR calc Af Amer >60 >60 mL/min    Comment: (NOTE) The eGFR has been calculated using the CKD EPI equation. This calculation has not been validated in all clinical situations. eGFR's persistently <60 mL/min signify possible Chronic Kidney Disease.    Anion gap 8 5 - 15  Ethanol     Status: None   Collection Time: 11/04/15  3:50 PM  Result Value Ref Range   Alcohol, Ethyl (B) <5 <5 mg/dL    Comment:        LOWEST DETECTABLE LIMIT FOR SERUM ALCOHOL IS 5 mg/dL FOR MEDICAL PURPOSES ONLY   Salicylate level  Status: None   Collection Time: 11/04/15  3:50 PM  Result Value Ref Range   Salicylate Lvl <2.5 2.8 - 30.0 mg/dL  Acetaminophen level     Status: Abnormal   Collection Time: 11/04/15  3:50 PM  Result Value Ref Range   Acetaminophen (Tylenol), Serum <10 (L) 10 - 30 ug/mL    Comment:        THERAPEUTIC CONCENTRATIONS VARY SIGNIFICANTLY. A RANGE OF 10-30 ug/mL MAY BE AN EFFECTIVE CONCENTRATION FOR MANY PATIENTS. HOWEVER, SOME ARE BEST TREATED AT CONCENTRATIONS OUTSIDE THIS RANGE. ACETAMINOPHEN CONCENTRATIONS >150 ug/mL AT 4 HOURS AFTER INGESTION AND >50 ug/mL AT 12 HOURS AFTER INGESTION ARE OFTEN ASSOCIATED WITH TOXIC REACTIONS.   cbc     Status: Abnormal   Collection Time: 11/04/15  3:50 PM  Result Value Ref Range   WBC 15.5 (H) 4.0 - 10.5 K/uL   RBC 3.65 (L) 3.87 - 5.11 MIL/uL   Hemoglobin 8.7 (L) 12.0 - 15.0 g/dL   HCT 27.0 (L) 36.0 - 46.0 %   MCV 74.0 (L) 78.0 - 100.0 fL   MCH 23.8 (L) 26.0 - 34.0 pg   MCHC 32.2 30.0 - 36.0 g/dL   RDW 19.0 (H) 11.5 - 15.5 %   Platelets 444 (H) 150 - 400 K/uL  Rapid urine drug screen (hospital performed)     Status: None   Collection Time: 11/04/15  9:00 PM  Result Value Ref Range    Opiates NONE DETECTED NONE DETECTED   Cocaine NONE DETECTED NONE DETECTED   Benzodiazepines NONE DETECTED NONE DETECTED   Amphetamines NONE DETECTED NONE DETECTED   Tetrahydrocannabinol NONE DETECTED NONE DETECTED   Barbiturates NONE DETECTED NONE DETECTED    Comment:        DRUG SCREEN FOR MEDICAL PURPOSES ONLY.  IF CONFIRMATION IS NEEDED FOR ANY PURPOSE, NOTIFY LAB WITHIN 5 DAYS.        LOWEST DETECTABLE LIMITS FOR URINE DRUG SCREEN Drug Class       Cutoff (ng/mL) Amphetamine      1000 Barbiturate      200 Benzodiazepine   427 Tricyclics       062 Opiates          300 Cocaine          300 THC              50   Pregnancy, urine     Status: None   Collection Time: 11/04/15  9:00 PM  Result Value Ref Range   Preg Test, Ur NEGATIVE NEGATIVE    Comment:        THE SENSITIVITY OF THIS METHODOLOGY IS >20 mIU/mL.     Current Facility-Administered Medications  Medication Dose Route Frequency Provider Last Rate Last Dose  . benztropine (COGENTIN) tablet 1 mg  1 mg Oral BID Patrecia Pour, NP   1 mg at 11/04/15 2110  . haloperidol (HALDOL) tablet 5 mg  5 mg Oral BID Patrecia Pour, NP   5 mg at 11/04/15 2110  . haloperidol (HALDOL) tablet 5 mg  5 mg Oral Once Laverle Hobby, PA-C   5 mg at 11/05/15 0110  . pyridOXINE (VITAMIN B-6) tablet 100 mg  100 mg Oral Daily Patrecia Pour, NP   100 mg at 11/04/15 2110   Current Outpatient Prescriptions  Medication Sig Dispense Refill  . benztropine (COGENTIN) 1 MG tablet Take 1 tablet (1 mg total) by mouth 2 (two) times daily. (Patient taking differently: Take 1 mg  by mouth 2 (two) times daily as needed (EPS symptoms). ) 60 tablet 0  . perphenazine (TRILAFON) 4 MG tablet Take 4 mg by mouth 2 (two) times daily.    . haloperidol (HALDOL) 5 MG tablet Take 1 tablet (5 mg total) by mouth 2 (two) times daily. 60 tablet 0  . nicotine polacrilex (NICORETTE) 2 MG gum Take 1 each (2 mg total) by mouth as needed for smoking cessation. (Patient not  taking: Reported on 06/11/2015) 100 tablet 0  . pyridOXINE (VITAMIN B-6) 100 MG tablet Take 1 tablet (100 mg total) by mouth daily. For low B-6 Vitamin      Musculoskeletal: Strength & Muscle Tone: within normal limits Gait & Station: normal Patient leans: N/A  Psychiatric Specialty Exam: Physical Exam  Constitutional: She is oriented to person, place, and time. She appears well-developed and well-nourished.  HENT:  Head: Normocephalic.  Neck: Normal range of motion.  Respiratory: Effort normal.  Musculoskeletal: Normal range of motion.  Neurological: She is alert and oriented to person, place, and time.  Skin: Skin is warm and dry.  Psychiatric: Her speech is normal and behavior is normal. Judgment and thought content normal. Her affect is blunt. Cognition and memory are normal. She exhibits a depressed mood.    Review of Systems  Constitutional: Negative.   HENT: Negative.   Eyes: Negative.   Respiratory: Negative.   Cardiovascular: Negative.   Gastrointestinal: Negative.   Genitourinary: Negative.   Musculoskeletal: Negative.   Skin: Negative.   Neurological: Negative.   Endo/Heme/Allergies: Negative.   Psychiatric/Behavioral: Positive for depression.    Blood pressure 105/52, pulse 67, temperature 97.7 F (36.5 C), temperature source Oral, resp. rate 16, SpO2 94 %.There is no weight on file to calculate BMI.  General Appearance: Casual  Eye Contact:  Good  Speech:  Normal Rate  Volume:  Normal  Mood:  Depressed, mild  Affect:  Blunt  Thought Process:  Coherent and Descriptions of Associations: Intact  Orientation:  Full (Time, Place, and Person)  Thought Content:  WDL  Suicidal Thoughts:  Yes.  without intent/plan  Homicidal Thoughts:  No  Memory:  Immediate;   Good Recent;   Good Remote;   Good  Judgement:  Good  Insight:  Fair  Psychomotor Activity:  Normal  Concentration:  Concentration: Good and Attention Span: Good  Recall:  Good  Fund of Knowledge:   Good  Language:  Good  Akathisia:  No  Handed:  Right  AIMS (if indicated):     Assets:  Housing Leisure Time Physical Health Resilience Social Support  ADL's:  Intact  Cognition:  WNL  Sleep:        Treatment Plan Summary: Daily contact with patient to assess and evaluate symptoms and progress in treatment, Medication management and Plan schizoaffective disorder, bipolar type:  -Crisis stabilization -Medication management:  Restarted her Haldol 5 mg BID for psychosis, Cogentin 1 mg BID for EPS, Trilafon 4 mg BID for mood stabilization. -Individual counseling -ACT team notified, coordination of care  Disposition: No evidence of imminent risk to self or others at present.    Waylan Boga, NP 11/05/2015 6:25 AM Patient seen face-to-face for psychiatric evaluation, chart reviewed and case discussed with the physician extender and developed treatment plan. Reviewed the information documented and agree with the treatment plan. Corena Pilgrim, MD

## 2015-11-05 NOTE — ED Notes (Signed)
[facility] ACT Team expressing concern about pt discharge. Tom TTS notified.

## 2015-11-05 NOTE — ED Notes (Signed)
Patient complains of increase anxiety and hearing voices. Respirations equal and unlabored, skin warm and dry. NAD noted. Lonzo Cloud contacted and verbal orders received Ativan 2 mg PO x 1 dose and Haldol 5 mg PO x 1 dose orders read back and verified.

## 2015-11-05 NOTE — ED Notes (Signed)
Pt refusing vital signs

## 2015-11-05 NOTE — Discharge Instructions (Signed)
For your ongoing behavioral health needs, you are advised to continue treatment with [facility], your ACT Team provider:       [facility] ACT Team      [facility], [address]      [address]      [place], [place]  [address]      [phone]

## 2015-11-05 NOTE — Consult Note (Signed)
Patient was to be discharged today to her ACT team who must be new to this patient as they do not appear to know her well.  They reported the patient has been off her medications for the past two months yet she was just discharged from [facility] last week where she was on psychiatric medications.  She was also started on her medications on arrival at this ED and continues not to meet criteria for admission.  The ACT team, [facility], was concerned about her but she is not actively suicidal or homicidal, no hallucinations, and no alcohol/drug withdrawal.  [name] is well known to this ED for coming sleeping, relaxing in the dayroom, and requesting multiple snacks/drinks/food.  She is exhibiting these behaviors as usual and will be discharged in the am to her ACT team or with a bus pass.  [name], PMH-NP Patient seen face-to-face for psychiatric evaluation, chart reviewed and case discussed with the physician extender and developed treatment plan. Reviewed the information documented and agree with the treatment plan. Patricia Pilgrim, MD

## 2015-11-05 NOTE — ED Notes (Signed)
Departure condition documentation charted in error (charted on wrong pt.)

## 2015-11-06 LAB — CBG MONITORING, ED: GLUCOSE-CAPILLARY: 90 mg/dL (ref 65–99)

## 2015-11-06 NOTE — ED Notes (Signed)
Pt was angry because she was discharged home. When the doctors interviewed her this morning she refused to speak to them. After they informed her that she was being discharged she escalated and demanded to get her bus ticket and be released. She ranted and raved and continued to demand her bus ticket, "Just give me my bus ticket and let me go!". She refused to sign for the belongings that were returned to her. Discharge instructions were given to her and she refused to read them and would not listen to a verbal discharge instruction. She continued to be angry and raving, so security and police escorted her to the exit.

## 2015-11-06 NOTE — Progress Notes (Signed)
Patient did not meet criteria for admission yesterday nor today.  She became upset at discharge and told staff to give her her papers and a bus ticket so she can get out of here.  Waylan Boga, PMHNP

## 2015-11-06 NOTE — BH Assessment (Signed)
[place] Assessment Progress Note  Per Corena Pilgrim, MD, this patient does not require psychiatric hospitalization at this time. Pt is to be discharged from [facility] with recommendation to continue treatment with the [facility] ACT Team, her current outpatient provider. Pt is eager to depart from [facility] and does not want to wait for [facility] to come and pick her up. Contact information for [facility] ACT Team has been included in pt's discharge instructions. Pt's nurse, Diane, has been notified.  Jalene Mullet, [place] Triage Specialist [phone]

## 2015-11-06 NOTE — Progress Notes (Signed)
D Pt. Reports passive SI no HI, denies A and VH at present time.  Denies pain or discomfort.  A Writer offered support and encouragement, discussed pt.'s day as well as her medications with her.  R Pt. Initially only had small head gesturesas response to Probation officer. Writer woke pt. Up for VS , HS snack and medications.  Pt. Was pleasant, allowed writer to get her VS;s but stated she does not take haldol or seroquel because they give her tremors.  Writer explained that she was also getting cogentin but pt. Continued to find reasons not to take the medications.  Pt. Did eventually say she would take them if she had difficulty sleeping but pt. Went back to sleep without difficulty.  Pt. Does contract for safety.  Pt. Remains safe on the unit and is presently resting quietly.

## 2015-11-06 NOTE — ED Notes (Signed)
Pt refused all medication this morning.

## 2015-11-09 ENCOUNTER — Encounter (HOSPITAL_COMMUNITY): Payer: Self-pay | Admitting: Emergency Medicine

## 2015-11-09 ENCOUNTER — Emergency Department (HOSPITAL_COMMUNITY)
Admission: EM | Admit: 2015-11-09 | Discharge: 2015-11-12 | Disposition: A | Discharge status: Home / Self Care | Payer: Medicaid Other | Attending: Emergency Medicine | Admitting: Emergency Medicine

## 2015-11-09 ENCOUNTER — Emergency Department (HOSPITAL_COMMUNITY)
Admission: EM | Admit: 2015-11-09 | Discharge: 2015-11-09 | Discharge status: Against Medical Advice | Payer: No Typology Code available for payment source

## 2015-11-09 DIAGNOSIS — Z79899 Other long term (current) drug therapy: Secondary | ICD-10-CM | POA: Diagnosis not present

## 2015-11-09 DIAGNOSIS — F250 Schizoaffective disorder, bipolar type: Secondary | ICD-10-CM | POA: Diagnosis not present

## 2015-11-09 DIAGNOSIS — T433X2A Poisoning by phenothiazine antipsychotics and neuroleptics, intentional self-harm, initial encounter: Secondary | ICD-10-CM | POA: Diagnosis not present

## 2015-11-09 DIAGNOSIS — F17210 Nicotine dependence, cigarettes, uncomplicated: Secondary | ICD-10-CM | POA: Diagnosis not present

## 2015-11-09 DIAGNOSIS — I10 Essential (primary) hypertension: Secondary | ICD-10-CM | POA: Insufficient documentation

## 2015-11-09 DIAGNOSIS — R45851 Suicidal ideations: Secondary | ICD-10-CM | POA: Diagnosis present

## 2015-11-09 DIAGNOSIS — T50902A Poisoning by unspecified drugs, medicaments and biological substances, intentional self-harm, initial encounter: Secondary | ICD-10-CM

## 2015-11-09 DIAGNOSIS — M1990 Unspecified osteoarthritis, unspecified site: Secondary | ICD-10-CM | POA: Insufficient documentation

## 2015-11-09 LAB — COMPREHENSIVE METABOLIC PANEL
ALT: 11 U/L — AB (ref 14–54)
ANION GAP: 7 (ref 5–15)
AST: 18 U/L (ref 15–41)
Albumin: 4 g/dL (ref 3.5–5.0)
Alkaline Phosphatase: 64 U/L (ref 38–126)
BUN: 12 mg/dL (ref 6–20)
CHLORIDE: 108 mmol/L (ref 101–111)
CO2: 24 mmol/L (ref 22–32)
Calcium: 9 mg/dL (ref 8.9–10.3)
Creatinine, Ser: 0.72 mg/dL (ref 0.44–1.00)
GFR calc non Af Amer: >60 mL/min (ref >60)
Glucose, Bld: 104 mg/dL — ABNORMAL HIGH (ref 65–99)
Potassium: 3.1 mmol/L — ABNORMAL LOW (ref 3.5–5.1)
SODIUM: 139 mmol/L (ref 135–145)
Total Bilirubin: 0.6 mg/dL (ref 0.3–1.2)
Total Protein: 7.9 g/dL (ref 6.5–8.1)

## 2015-11-09 LAB — CBC
HEMATOCRIT: 24.2 % — AB (ref 36.0–46.0)
HEMOGLOBIN: 7.6 g/dL — AB (ref 12.0–15.0)
MCH: 23.5 pg — ABNORMAL LOW (ref 26.0–34.0)
MCHC: 31.4 g/dL (ref 30.0–36.0)
MCV: 74.9 fL — ABNORMAL LOW (ref 78.0–100.0)
Platelets: 406 10*3/uL — ABNORMAL HIGH (ref 150–400)
RBC: 3.23 MIL/uL — AB (ref 3.87–5.11)
RDW: 18.9 % — ABNORMAL HIGH (ref 11.5–15.5)
WBC: 11.1 10*3/uL — AB (ref 4.0–10.5)

## 2015-11-09 LAB — SALICYLATE LEVEL

## 2015-11-09 LAB — ETHANOL: Alcohol, Ethyl (B): <5 mg/dL (ref <5)

## 2015-11-09 MED ORDER — LORAZEPAM 1 MG PO TABS
2.0000 mg | ORAL_TABLET | Freq: Once | ORAL | Status: DC
  Filled 2015-11-09: qty 2

## 2015-11-09 NOTE — ED Provider Notes (Addendum)
[facility] DEPT Provider Note   CSN: [id number] Arrival date & time: 11/09/15  1933  First Provider Contact:  First MD Initiated Contact with Patient 11/09/15 2015     LEVEL 5 CAVEAT - PSYCHIATRIC DISEASE   History   Chief Complaint Chief Complaint  Patient presents with  . Drug Overdose    HPI [name] is a 45 y.o. female presenting after an intentional drug overdose. Patient has a history of schizophrenia and suicidal attempts. Patient states she came in to the waiting room and then took around 15 tablets of her 4 mg perphenazine. This is just prior to arrival. She states she's developed a mild headache and vomited once. She states she is chronically suicidal, mostly because of the people around her. Denies any coingestions. Denies any alcohol use or drug use. Patient was recently discharged from the emergency department after a similar presentation. History is difficult as patient intermittently just stops talking and stares at me. Endorses hearing voices for "a while".  HPI  Past Medical History:  Diagnosis Date  . Arthritis   . Bipolar 1 disorder ([place])   . Bronchitis   . Depression   . Hemorrhoid   . Schizoaffective disorder ([place])   . Schizophrenia ([place])    Pt denies and reports it is schizoaffective disorder.   . Transfusion history     Patient Active Problem List   Diagnosis Date Noted  . No diagnosis on Axis I 08/21/2015  . Abnormal behavior   . Involuntary commitment   . Schizophrenia ([place])   . Disturbance in affect ([place])   . Tobacco use disorder 05/12/2015  . Cannabis use disorder, moderate, dependence ([place]) 05/12/2015  . Hypertension 02/24/2011  . ANEMIA-IRON DEFICIENCY 12/13/2006  . GERD 12/13/2006  . OBESITY 12/12/2006  . Schizoaffective disorder, bipolar type ([place]) 12/12/2006  . HEMORRHOIDS, NOS 12/12/2006  . PAIN-NECK 12/12/2006  . TREMOR 12/12/2006    Past Surgical History:  Procedure Laterality Date  . CESAREAN SECTION      OB  History    No data available       Home Medications    Prior to Admission medications   Medication Sig Start Date End Date Taking? Authorizing Provider  benztropine (COGENTIN) 1 MG tablet Take 1 tablet (1 mg total) by mouth 2 (two) times daily. Patient not taking: Reported on 11/09/2015 08/14/15   Delfin Gant, NP  haloperidol (HALDOL) 5 MG tablet Take 1 tablet (5 mg total) by mouth 2 (two) times daily. Patient not taking: Reported on 11/09/2015 08/14/15   Delfin Gant, NP    Family History Family History  Problem Relation Age of Onset  . Diabetes type II Other     Social History Social History  Substance Use Topics  . Smoking status: Current Every Day Smoker    Packs/day: 0.50    Years: 17.00    Types: Cigarettes  . Smokeless tobacco: Never Used  . Alcohol use No     Allergies   Latuda [lurasidone hcl]; Flagyl [metronidazole hcl]; and Sulfa antibiotics   Review of Systems Review of Systems  Unable to perform ROS: Psychiatric disorder     Physical Exam Updated Vital Signs BP 157/78   Pulse 93   Temp 98.6 F (37 C) (Oral)   Resp 22   SpO2 100%   Physical Exam  Constitutional: She is oriented to person, place, and time. She appears well-developed and well-nourished.  HENT:  Head: Normocephalic and atraumatic.  Right Ear: External ear  normal.  Left Ear: External ear normal.  Nose: Nose normal.  Eyes: Right eye exhibits no discharge. Left eye exhibits no discharge.  Cardiovascular: Normal rate, regular rhythm and normal heart sounds.   Pulmonary/Chest: Effort normal and breath sounds normal.  Abdominal: She exhibits no distension.  Neurological: She is alert and oriented to person, place, and time.  Skin: Skin is warm and dry. She is not diaphoretic.  Psychiatric: Her speech is delayed. Her speech is not rapid and/or pressured. She is slowed. She is not agitated and not aggressive. She exhibits a depressed mood. She expresses suicidal ideation.  She expresses suicidal plans.  Intermittently stares and stops talking after I ask a question.  Nursing note and vitals reviewed.    ED Treatments / Results  Labs (all labs ordered are listed, but only abnormal results are displayed) Labs Reviewed  COMPREHENSIVE METABOLIC PANEL  ETHANOL  SALICYLATE LEVEL  CBC  URINE RAPID DRUG SCREEN, HOSP PERFORMED  ACETAMINOPHEN LEVEL  POC URINE PREG, ED    EKG  EKG Interpretation None       Radiology No results found.  Procedures Procedures (including critical care time)  Medications Ordered in ED Medications - No data to display   Initial Impression / Assessment and Plan / ED Course  I have reviewed the triage vital signs and the nursing notes.  Pertinent labs & imaging results that were available during my care of the patient were reviewed by me and considered in my medical decision making (see chart for details).  Clinical Course  Comment By Time  Patient is hemodynamically stable. Just took these meds. Reports 15 but nurse has bottle and only would be missing 2 pills if she took if every day like normal. Will monitor, get labs, EKG, sitter, TTS consult Sherwood Gambler, MD 07/23 2014  I spoke with Marijean Bravo from behavioral health. Patient meets inpatient criteria, they will look for outside placement. Nurse tells me patient refused EKG. She is asking for charcoal. Discussed no charcoal and will need EKG. Will need to IVC if she continues to refuse treatments given her suicide attempt.  Sherwood Gambler, MD 07/23 2104  Appears medically stable. VS unremarkable. Monitored on telemetry without acute events. Appears to have no adverse effects from her intentional OD. Will place in psych hold and await placement Sherwood Gambler, MD 07/24 [phone]   Unclear why she is anemic although she is near baseline. Will recheck CBC in AM. Discussed with oncoming ED provider, this will need to be followed in AM to be clear she is not dropping.  Patient is  currently voluntary but would need to be IVC'd if she decided to leave given her suicide attempt.  Final Clinical Impressions(s) / ED Diagnoses   Final diagnoses:  Drug overdose, intentional self-harm, initial encounter [facility])    New Prescriptions New Prescriptions   No medications on file     Sherwood Gambler, MD 11/10/15 [place], MD 11/10/15 0045

## 2015-11-09 NOTE — ED Triage Notes (Signed)
Pt presents by herself after reportedly taking approximately 30 of her perphenazine (4mg ) in an attempt to harm herself. Pt seen at [facility] recently. Alert and oriented.

## 2015-11-09 NOTE — ED Notes (Signed)
Bed: [id number] Expected date:  Expected time:  Means of arrival:  Comments: Hold for [place]

## 2015-11-09 NOTE — BH Assessment (Addendum)
Tele Assessment Note   [name] is an 45 y.o. single female who presents unaccompanied to [facility] ED after intentionally ingesting approximately 15 tablets of 4 mg perphenazine while in the waiting room of the ED. Pt has a history of schizoaffective disorder and says she has not been taking medications as prescribed. Pt appears to be thought blocking as she suddenly stops speaking, stares and after several second responds to an unrelated topic.She reports there are people who are bothering her, including a boyfriend whom she says is married and having relationships with multiple people. Pt states she was frustrated, tired and wanted to give up so she took the medication. Pt reports a history of multiple suicide attempts. She reports auditory hallucinations that are not command in nature but acknowledges they bother her. She says she has not slept in 3-4 days and has been eating once per day. Pt reports symptoms including crying spells, social withdrawal, loss of interest in usual pleasures, fatigue, irritability, decreased concentration, decreased appetite and feelings of guilt and hopelessness. She says she feels "overwhelmed" and "unappreciated." She denies homicidal ideation or history of violence. She denies visual hallucinations. Pt denies any use of alcohol or other substances; blood alcohol level and urine drug screen are pending.  Pt identifies people who are coming to her residence as her primary stressor. She repeatedly says that negative people will not leave her alone and that she is trying to be a positive person. Pt says she "can't express emotion" due to these people. This LPC asked Pt several different ways to explain her living situation but she was unable to do so. Pt says she has four children and is upset because they are not doing well and are not living with her. Pt says she has a good support system but is unable to identify specific people. Pt denies any current legal  problems.  Pt has a history of presentations to the ED where she is selectively mute. Pt's medical record indicates she is followed by [facility]. Pt cannot name her current outpatient providers. Pt acknowledges she was discharged from [facility] 10/28/15. Pt was last inpatient at [facility] in January 2017.  Pt is dressed in hospital scrubs, alert, oriented x4 with speech f normal tone and volume. Pt's stops speaking for 30-45 seconds, stares and then resumes speaking. Motor behavior appears WNL. Eye contact is fair. Pt's mood is depressed and affect is congruent with mood. Pt was generally cooperative throughout assessment. Pt states she needs to be in a psychiatric facility.   Diagnosis: Schizoaffective Disorder  Past Medical History:  Past Medical History:  Diagnosis Date  . Arthritis   . Bipolar 1 disorder ([place])   . Bronchitis   . Depression   . Hemorrhoid   . Schizoaffective disorder ([place])   . Schizophrenia ([place])    Pt denies and reports it is schizoaffective disorder.   . Transfusion history     Past Surgical History:  Procedure Laterality Date  . CESAREAN SECTION      Family History:  Family History  Problem Relation Age of Onset  . Diabetes type II Other     Social History:  reports that she has been smoking Cigarettes.  She has a 8.50 pack-year smoking history. She has never used smokeless tobacco. She reports that she does not drink alcohol or use drugs.  Additional Social History:  Alcohol / Drug Use Pain Medications: See MAR Prescriptions: See MAR Over the Counter: See [facility]  History of alcohol / drug use?: No history of alcohol / drug abuse Longest period of sobriety (when/how long): NA  CIWA: CIWA-Ar BP: 157/78 Pulse Rate: 93 COWS:    PATIENT STRENGTHS: (choose at least two) Ability for insight Average or above average intelligence Capable of independent living General fund of knowledge Motivation for treatment/growth Physical Health Supportive  family/friends  Allergies:  Allergies  Allergen Reactions  . Anette Guarneri [Lurasidone Hcl] Nausea And Vomiting and Other (See Comments)    Reaction:  Shaking   . Flagyl [Metronidazole Hcl] Nausea And Vomiting  . Sulfa Antibiotics Nausea And Vomiting    Home Medications:  (Not in a hospital admission)  OB/GYN Status:  No LMP recorded.  General Assessment Data Location of Assessment: [facility] ED TTS Assessment: In system Is this a Tele or Face-to-Face Assessment?: Tele Assessment Is this an Initial Assessment or a Re-assessment for this encounter?: Initial Assessment Marital status: Single Maiden name: NA Is patient pregnant?: No Pregnancy Status: No Living Arrangements: Alone Can pt return to current living arrangement?: Yes Admission Status: Voluntary Is patient capable of signing voluntary admission?: Yes Referral Source: Self/Family/Friend Insurance type: Medicaid     Crisis Care Plan Living Arrangements: Alone Legal Guardian: Other: (Self) Name of Psychiatrist: Unknown Name of Therapist: Unknown  Education Status Is patient currently in school?: No Current Grade: NA Highest grade of school patient has completed: NA Name of school: NA Contact person: NA  Risk to self with the past 6 months Suicidal Ideation: Yes-Currently Present Has patient been a risk to self within the past 6 months prior to admission? : Yes Suicidal Intent: Yes-Currently Present Has patient had any suicidal intent within the past 6 months prior to admission? : Yes Is patient at risk for suicide?: Yes Suicidal Plan?: Yes-Currently Present Has patient had any suicidal plan within the past 6 months prior to admission? : Yes Specify Current Suicidal Plan: Pt overdosed on medication today Access to Means: Yes Specify Access to Suicidal Means: Access to medications What has been your use of drugs/alcohol within the last 12 months?: Pt denies Previous Attempts/Gestures: Yes How many times?: 5 (Multiple  attempts) Other Self Harm Risks: None Triggers for Past Attempts: Hallucinations Intentional Self Injurious Behavior: None Family Suicide History: Unknown Recent stressful life event(s): Conflict (Comment) (Pt reports conflict with female friend) Persecutory voices/beliefs?: Yes Depression: Yes Depression Symptoms: Despondent, Insomnia, Tearfulness, Isolating, Fatigue, Guilt, Loss of interest in usual pleasures, Feeling worthless/self pity, Feeling angry/irritable Substance abuse history and/or treatment for substance abuse?: No Suicide prevention information given to non-admitted patients: Not applicable  Risk to Others within the past 6 months Homicidal Ideation: No Does patient have any lifetime risk of violence toward others beyond the six months prior to admission? : No Thoughts of Harm to Others: No Current Homicidal Intent: No Current Homicidal Plan: No Access to Homicidal Means: No Identified Victim: None History of harm to others?: No Assessment of Violence: None Noted Violent Behavior Description: No known history of violence Does patient have access to weapons?: No Criminal Charges Pending?: No Does patient have a court date: No Is patient on probation?: No  Psychosis Hallucinations: Auditory (Pt reports hearing voices) Delusions: None noted  Mental Status Report Appearance/Hygiene: Unremarkable Eye Contact: Fair Motor Activity: Unremarkable Speech: Logical/coherent Level of Consciousness: Alert Mood: Depressed, Guilty, Other (Comment) (Frustrated) Affect: Depressed Anxiety Level: None Thought Processes: Thought Blocking, Coherent Judgement: Impaired Orientation: Person, Place, Time, Situation, Appropriate for developmental age Obsessive Compulsive Thoughts/Behaviors: None  Cognitive Functioning Concentration:  Decreased Memory: Recent Intact, Remote Intact IQ: Average Insight: Poor Impulse Control: Fair Appetite: Fair Weight Loss: 0 Weight Gain:  0 Sleep: Decreased Total Hours of Sleep: 0 (Pt reports insomnia for three days) Vegetative Symptoms: None  ADLScreening [facility] Assessment Services) Patient's cognitive ability adequate to safely complete daily activities?: Yes Patient able to express need for assistance with ADLs?: Yes Independently performs ADLs?: Yes (appropriate for developmental age)  Prior Inpatient Therapy Prior Inpatient Therapy: Yes Prior Therapy Dates: 10/2015, multiple admits Prior Therapy Facilty/Provider(s): [facility] Reason for Treatment: Schizoaffective Disorder  Prior Outpatient Therapy Prior Outpatient Therapy: Yes Prior Therapy Dates: 2017 Prior Therapy Facilty/Provider(s): [facility] Reason for Treatment: Schizoaffective Disorder Does patient have an ACCT team?: Yes ([facility]) Does patient have Intensive In-House Services?  : No Does patient have [facility] services? : No Does patient have P4CC services?: No  ADL Screening (condition at time of admission) Patient's cognitive ability adequate to safely complete daily activities?: Yes Is the patient deaf or have difficulty hearing?: No Does the patient have difficulty seeing, even when wearing glasses/contacts?: No Does the patient have difficulty concentrating, remembering, or making decisions?: No Patient able to express need for assistance with ADLs?: Yes Does the patient have difficulty dressing or bathing?: No Independently performs ADLs?: Yes (appropriate for developmental age) Does the patient have difficulty walking or climbing stairs?: No Weakness of Legs: None Weakness of Arms/Hands: None       Abuse/Neglect Assessment (Assessment to be complete while patient is alone) Physical Abuse: Denies Verbal Abuse: Denies Sexual Abuse: Denies Exploitation of patient/patient's resources: Denies Self-Neglect: Denies     Regulatory affairs officer (For Healthcare) Does patient have an advance directive?: No    Additional Information 1:1  In Past 12 Months?: No CIRT Risk: No Elopement Risk: No Does patient have medical clearance?: Yes     Disposition: Wynonia Hazard, AC a [facility], confirms adult unit is at capacity. Gave clinical report to Priscille Loveless, FNP who said Pt meets criteria for inpatient psychiatric treatment. TTS will contact other facilities for placement. Notified Sherwood Gambler, MD and Christie Beckers, RN of recommendation.  Disposition Initial Assessment Completed for this Encounter: Yes Disposition of Patient: Inpatient treatment program Type of inpatient treatment program: Adult  Evelena Peat, [facility], [facility], [facility] Triage Specialist [phone]   Evelena Peat 11/09/2015 8:57 PM

## 2015-11-09 NOTE — ED Triage Notes (Signed)
[facility], pt requesting transport for pregnancy issues, pt states she 3 months pregnant but due today.

## 2015-11-09 NOTE — ED Notes (Signed)
Pt had several episodes of crying and screaming. MD at bedside during events and medication ordered to assist with agitation.

## 2015-11-09 NOTE — ED Notes (Signed)
Pt refused ativan at this time. Pt states that she can sleep without it. Pt continues to be monitored for any cardiac changes. Pt aware of need for urine sample.

## 2015-11-09 NOTE — ED Notes (Signed)
Pt refused to move for blood draw, pt let writer stick once but not for the second time, RN made aware.

## 2015-11-09 NOTE — ED Notes (Signed)
Pt changed and belongings collected. Pt has been wanded by security.

## 2015-11-09 NOTE — ED Notes (Signed)
Patient refuses to let this writer do an EKG. Patient states " I am not rolling over and you are not putting stickers on me. You are not doing that."

## 2015-11-09 NOTE — ED Notes (Addendum)
Telepsych machine placed at bedside and pt is currently having evaluation at this time.

## 2015-11-09 NOTE — ED Triage Notes (Signed)
Pt to desk stating she was leaving, pt used telephone to call taxi for ride home. Pt walked to waiting area.

## 2015-11-09 NOTE — ED Notes (Signed)
P 

## 2015-11-10 LAB — RAPID URINE DRUG SCREEN, HOSP PERFORMED
Amphetamines: NOT DETECTED
BARBITURATES: NOT DETECTED
BENZODIAZEPINES: NOT DETECTED
COCAINE: NOT DETECTED
OPIATES: NOT DETECTED
TETRAHYDROCANNABINOL: NOT DETECTED

## 2015-11-10 LAB — I-STAT BETA HCG BLOOD, ED (MC, WL, AP ONLY)

## 2015-11-10 LAB — ACETAMINOPHEN LEVEL: Acetaminophen (Tylenol), Serum: <10 ug/mL — ABNORMAL LOW (ref 10–30)

## 2015-11-10 LAB — CBC
HEMATOCRIT: 26.9 % — AB (ref 36.0–46.0)
HEMOGLOBIN: 8 g/dL — AB (ref 12.0–15.0)
MCH: 23.5 pg — ABNORMAL LOW (ref 26.0–34.0)
MCHC: 29.7 g/dL — AB (ref 30.0–36.0)
MCV: 78.9 fL (ref 78.0–100.0)
Platelets: 416 10*3/uL — ABNORMAL HIGH (ref 150–400)
RBC: 3.41 MIL/uL — AB (ref 3.87–5.11)
RDW: 19.1 % — ABNORMAL HIGH (ref 11.5–15.5)
WBC: 8.4 10*3/uL (ref 4.0–10.5)

## 2015-11-10 MED ORDER — ACETAMINOPHEN 325 MG PO TABS
650.0000 mg | ORAL_TABLET | ORAL | Status: DC | PRN
Start: 2015-11-10 — End: 2015-11-12

## 2015-11-10 MED ORDER — HALOPERIDOL 5 MG PO TABS
5.0000 mg | ORAL_TABLET | Freq: Two times a day (BID) | ORAL | Status: DC
  Filled 2015-11-10: qty 1

## 2015-11-10 MED ORDER — LORAZEPAM 1 MG PO TABS: 1.0000 mg | ORAL_TABLET | Freq: Three times a day (TID) | ORAL | Status: DC | PRN

## 2015-11-10 MED ORDER — ZOLPIDEM TARTRATE 5 MG PO TABS: 5.0000 mg | ORAL_TABLET | Freq: Every evening | ORAL | Status: DC | PRN

## 2015-11-10 MED ORDER — BENZTROPINE MESYLATE 1 MG PO TABS
1.0000 mg | ORAL_TABLET | Freq: Two times a day (BID) | ORAL | Status: DC
  Administered 2015-11-11 – 2015-11-12 (×2): 1 mg via ORAL
  Filled 2015-11-10 (×3): qty 1

## 2015-11-10 MED ORDER — IBUPROFEN 200 MG PO TABS: 600.0000 mg | ORAL_TABLET | Freq: Three times a day (TID) | ORAL | Status: DC | PRN

## 2015-11-10 MED ORDER — NICOTINE 21 MG/24HR TD PT24
21.0000 mg | MEDICATED_PATCH | Freq: Every day | TRANSDERMAL | Status: DC
  Filled 2015-11-10 (×2): qty 1

## 2015-11-10 MED ORDER — ONDANSETRON HCL 4 MG PO TABS
4.0000 mg | ORAL_TABLET | Freq: Three times a day (TID) | ORAL | Status: DC | PRN
Start: 2015-11-10 — End: 2015-11-12

## 2015-11-10 NOTE — ED Notes (Signed)
Patient noted sleeping in room. No complaints, stable, in no acute distress. Q15 minute rounds and monitoring via Security Cameras to continue.  

## 2015-11-10 NOTE — ED Notes (Signed)
Pt upset at shift change about tray being taken away she reports. Snack given. AAO x 3, no distress noted, cooperative, irritable.  Monitoring for safety, Q 15 min checks in effect.

## 2015-11-10 NOTE — Progress Notes (Signed)
Pt confirmed with ED  CM that her pcp is Duane Boston at [facility] family practice EPIC updated

## 2015-11-10 NOTE — ED Notes (Signed)
Pt. Transferred to [facility] from ED to room 37. Report from [facility]. Pt. Oriented to unit including Q15 minute rounds as well as the security cameras for their protection. Patient is alert and oriented, warm and dry in no acute distress. Patient denies SI, HI, and AVH. Pt. Encouraged to let me know if needs arise.

## 2015-11-10 NOTE — Consult Note (Signed)
[facility] Consult   Reason for Consult:  Depression, suicide attempt Referring Physician:  EDP Patient Identification: [name] MRN:  [id number] Principal Diagnosis: Schizoaffective disorder, bipolar type [facility]) Diagnosis:   Patient Active Problem List   Diagnosis Date Noted  . Schizoaffective disorder, bipolar type ([place]) [F25.0] 12/12/2006    Priority: High  . Cannabis use disorder, moderate, dependence ([place]) [F12.20] 05/12/2015    Priority: Low  . No diagnosis on Axis I [Z03.89] 08/21/2015  . Abnormal behavior [R68.89]   . Involuntary commitment [Z04.6]   . Schizophrenia ([place]) [F20.9]   . Disturbance in affect ([place]) [F39]   . Tobacco use disorder [F17.200] 05/12/2015  . Hypertension [I10] 02/24/2011  . ANEMIA-IRON DEFICIENCY [D50.9] 12/13/2006  . GERD [K21.9] 12/13/2006  . OBESITY [E66.9] 12/12/2006  . HEMORRHOIDS, NOS [K64.9] 12/12/2006  . PAIN-NECK [M54.2] 12/12/2006  . TREMOR [R25.9] 12/12/2006    Total Time spent with patient: 45 minutes  Subjective:   [name] is a 45 y.o. female patient admitted with depression and suicide attempt.  HPI:  On assessment:  45 y.o. single female who presents unaccompanied to [facility] ED after intentionally ingesting approximately 15 tablets of 4 mg perphenazine while in the waiting room of the ED. Pt has a history of schizoaffective disorder and says she has not been taking medications as prescribed. Pt appears to be thought blocking as she suddenly stops speaking, stares and after several second responds to an unrelated topic.She reports there are people who are bothering her, including a boyfriend whom she says is married and having relationships with multiple people. Pt states she was frustrated, tired and wanted to give up so she took the medication. Pt reports a history of multiple suicide attempts. She reports auditory hallucinations that are not command in nature but acknowledges they bother her.  She says she has not slept in 3-4 days and has been eating once per day. Pt reports symptoms including crying spells, social withdrawal, loss of interest in usual pleasures, fatigue, irritability, decreased concentration, decreased appetite and feelings of guilt and hopelessness. She says she feels "overwhelmed" and "unappreciated." She denies homicidal ideation or history of violence. She denies visual hallucinations. Pt denies any use of alcohol or other substances; blood alcohol level and urine drug screen are pending.  Pt identifies people who are coming to her residence as her primary stressor. She repeatedly says that negative people will not leave her alone and that she is trying to be a positive person. Pt says she "can't express emotion" due to these people. This LPC asked Pt several different ways to explain her living situation but she was unable to do so. Pt says she has four children and is upset because they are not doing well and are not living with her. Pt says she has a good support system but is unable to identify specific people. Pt denies any current legal problems.  Pt has a history of presentations to the ED where she is selectively mute. Pt's medical record indicates she is followed by [facility] team. Pt cannot name her current outpatient providers. Pt acknowledges she was discharged from [facility] 10/28/15. Pt was last inpatient at [facility] in January 2017.  Today:  Patient continues to endorse depression with suicidal ideations.  Reports auditory hallucination but mood incongruent.  Denies homicidal ideations and alcohol/drug abuse.  Past Psychiatric History: schizoaffective disorder, substance abuse  Risk to Self: Suicidal Ideation: Yes-Currently Present Suicidal Intent: Yes-Currently Present Is patient  at risk for suicide?: Yes Suicidal Plan?: Yes-Currently Present Specify Current Suicidal Plan: Pt overdosed on medication today Access to Means: Yes Specify Access to  Suicidal Means: Access to medications What has been your use of drugs/alcohol within the last 12 months?: Pt denies How many times?: 5 (Multiple attempts) Other Self Harm Risks: None Triggers for Past Attempts: Hallucinations Intentional Self Injurious Behavior: None Risk to Others: Homicidal Ideation: No Thoughts of Harm to Others: No Current Homicidal Intent: No Current Homicidal Plan: No Access to Homicidal Means: No Identified Victim: None History of harm to others?: No Assessment of Violence: None Noted Violent Behavior Description: No known history of violence Does patient have access to weapons?: No Criminal Charges Pending?: No Does patient have a court date: No Prior Inpatient Therapy: Prior Inpatient Therapy: Yes Prior Therapy Dates: 10/2015, multiple admits Prior Therapy Facilty/Provider(s): [place], [facility] Reason for Treatment: Schizoaffective Disorder Prior Outpatient Therapy: Prior Outpatient Therapy: Yes Prior Therapy Dates: 2017 Prior Therapy Facilty/Provider(s): [facility] Reason for Treatment: Schizoaffective Disorder Does patient have an ACCT team?: Yes ([facility]) Does patient have Intensive In-House Services?  : No Does patient have [facility] services? : No Does patient have [facility] services?: No  Past Medical History:  Past Medical History:  Diagnosis Date  . Arthritis   . Bipolar 1 disorder ([place])   . Bronchitis   . Depression   . Hemorrhoid   . Schizoaffective disorder ([place])   . Schizophrenia ([place])    Pt denies and reports it is schizoaffective disorder.   . Transfusion history     Past Surgical History:  Procedure Laterality Date  . CESAREAN SECTION     Family History:  Family History  Problem Relation Age of Onset  . Diabetes type II Other    Family Psychiatric  History: none Social History:  History  Alcohol Use No     History  Drug Use No    Comment: Former user    Social History   Social History  . Marital status: Legally  Separated    Spouse name: N/A  . Number of children: N/A  . Years of education: N/A   Social History Main Topics  . Smoking status: Current Every Day Smoker    Packs/day: 0.50    Years: 17.00    Types: Cigarettes  . Smokeless tobacco: Never Used  . Alcohol use No  . Drug use: No     Comment: Former user  . Sexual activity: Not Asked   Other Topics Concern  . None   Social History Narrative   ** Merged History Encounter **       Additional Social History:    Allergies:   Allergies  Allergen Reactions  . Anette Guarneri [Lurasidone Hcl] Nausea And Vomiting and Other (See Comments)    Reaction:  Shaking   . Flagyl [Metronidazole Hcl] Nausea And Vomiting  . Sulfa Antibiotics Nausea And Vomiting    Labs:  Results for orders placed or performed during the hospital encounter of 11/09/15 (from the past 48 hour(s))  Comprehensive metabolic panel     Status: Abnormal   Collection Time: 11/09/15  9:58 PM  Result Value Ref Range   Sodium 139 135 - 145 mmol/L   Potassium 3.1 (L) 3.5 - 5.1 mmol/L   Chloride 108 101 - 111 mmol/L   CO2 24 22 - 32 mmol/L   Glucose, Bld 104 (H) 65 - 99 mg/dL   BUN 12 6 - 20 mg/dL   Creatinine, Ser  0.72 0.44 - 1.00 mg/dL   Calcium 9.0 8.9 - 10.3 mg/dL   Total Protein 7.9 6.5 - 8.1 g/dL   Albumin 4.0 3.5 - 5.0 g/dL   AST 18 15 - 41 U/L   ALT 11 (L) 14 - 54 U/L   Alkaline Phosphatase 64 38 - 126 U/L   Total Bilirubin 0.6 0.3 - 1.2 mg/dL   GFR calc non Af Amer >60 >60 mL/min   GFR calc Af Amer >60 >60 mL/min    Comment: (NOTE) The eGFR has been calculated using the CKD EPI equation. This calculation has not been validated in all clinical situations. eGFR's persistently <60 mL/min signify possible Chronic Kidney Disease.    Anion gap 7 5 - 15  Ethanol     Status: None   Collection Time: 11/09/15  9:58 PM  Result Value Ref Range   Alcohol, Ethyl (B) <5 <5 mg/dL    Comment:        LOWEST DETECTABLE LIMIT FOR SERUM ALCOHOL IS 5 mg/dL FOR MEDICAL  PURPOSES ONLY   Salicylate level     Status: None   Collection Time: 11/09/15  9:58 PM  Result Value Ref Range   Salicylate Lvl <1.8 2.8 - 30.0 mg/dL  cbc     Status: Abnormal   Collection Time: 11/09/15  9:58 PM  Result Value Ref Range   WBC 11.1 (H) 4.0 - 10.5 K/uL   RBC 3.23 (L) 3.87 - 5.11 MIL/uL   Hemoglobin 7.6 (L) 12.0 - 15.0 g/dL   HCT 24.2 (L) 36.0 - 46.0 %   MCV 74.9 (L) 78.0 - 100.0 fL   MCH 23.5 (L) 26.0 - 34.0 pg   MCHC 31.4 30.0 - 36.0 g/dL   RDW 18.9 (H) 11.5 - 15.5 %   Platelets 406 (H) 150 - 400 K/uL  Acetaminophen level     Status: Abnormal   Collection Time: 11/10/15 12:11 AM  Result Value Ref Range   Acetaminophen (Tylenol), Serum <10 (L) 10 - 30 ug/mL    Comment:        THERAPEUTIC CONCENTRATIONS VARY SIGNIFICANTLY. A RANGE OF 10-30 ug/mL MAY BE AN EFFECTIVE CONCENTRATION FOR MANY PATIENTS. HOWEVER, SOME ARE BEST TREATED AT CONCENTRATIONS OUTSIDE THIS RANGE. ACETAMINOPHEN CONCENTRATIONS >150 ug/mL AT 4 HOURS AFTER INGESTION AND >50 ug/mL AT 12 HOURS AFTER INGESTION ARE OFTEN ASSOCIATED WITH TOXIC REACTIONS.   I-Stat beta hCG blood, ED     Status: None   Collection Time: 11/10/15 12:16 AM  Result Value Ref Range   I-stat hCG, quantitative <5.0 <5 mIU/mL   Comment 3            Comment:   GEST. AGE      CONC.  (mIU/mL)   <=1 WEEK        5 - 50     2 WEEKS       50 - 500     3 WEEKS       100 - 10,000     4 WEEKS     1,000 - 30,000        FEMALE AND NON-PREGNANT FEMALE:     LESS THAN 5 mIU/mL     Current Facility-Administered Medications  Medication Dose Route Frequency Provider Last Rate Last Dose  . acetaminophen (TYLENOL) tablet 650 mg  650 mg Oral Q4H PRN Sherwood Gambler, MD      . ibuprofen (ADVIL,MOTRIN) tablet 600 mg  600 mg Oral Q8H PRN Sherwood Gambler, MD      .  LORazepam (ATIVAN) tablet 1 mg  1 mg Oral Q8H PRN Sherwood Gambler, MD      . nicotine (NICODERM CQ - dosed in mg/24 hours) patch 21 mg  21 mg Transdermal Daily Sherwood Gambler, MD       . ondansetron (ZOFRAN) tablet 4 mg  4 mg Oral Q8H PRN Sherwood Gambler, MD      . zolpidem (AMBIEN) tablet 5 mg  5 mg Oral QHS PRN Sherwood Gambler, MD       Current Outpatient Prescriptions  Medication Sig Dispense Refill  . benztropine (COGENTIN) 1 MG tablet Take 1 tablet (1 mg total) by mouth 2 (two) times daily. (Patient not taking: Reported on 11/09/2015) 60 tablet 0  . haloperidol (HALDOL) 5 MG tablet Take 1 tablet (5 mg total) by mouth 2 (two) times daily. (Patient not taking: Reported on 11/09/2015) 60 tablet 0    Musculoskeletal: Strength & Muscle Tone: within normal limits Gait & Station: normal Patient leans: N/A  Psychiatric Specialty Exam: Physical Exam  Constitutional: She is oriented to person, place, and time. She appears well-developed and well-nourished.  HENT:  Head: Normocephalic.  Neck: Normal range of motion.  Respiratory: Effort normal.  Musculoskeletal: Normal range of motion.  Neurological: She is alert and oriented to person, place, and time.  Skin: Skin is warm and dry.  Psychiatric: Her speech is normal. She is actively hallucinating. Cognition and memory are normal. She expresses impulsivity. She exhibits a depressed mood. She expresses suicidal ideation. She expresses suicidal plans.    Review of Systems  Constitutional: Negative.   HENT: Negative.   Eyes: Negative.   Respiratory: Negative.   Cardiovascular: Negative.   Gastrointestinal: Negative.   Genitourinary: Negative.   Musculoskeletal: Negative.   Skin: Negative.   Neurological: Negative.   Endo/Heme/Allergies: Negative.   Psychiatric/Behavioral: Positive for depression, hallucinations and suicidal ideas.    Blood pressure 125/64, pulse 60, temperature 98.1 F (36.7 C), temperature source Oral, resp. rate 18, SpO2 100 %.There is no height or weight on file to calculate BMI.  General Appearance: Casual  Eye Contact:  Fair  Speech:  Normal Rate  Volume:  Decreased  Mood:  Depressed   Affect:  Congruent  Thought Process:  Coherent and Descriptions of Associations: Intact  Orientation:  Full (Time, Place, and Person)  Thought Content:  Hallucinations: Auditory  Suicidal Thoughts:  Yes.  with intent/plan  Homicidal Thoughts:  No  Memory:  Immediate;   Fair Recent;   Fair Remote;   Fair  Judgement:  Impaired  Insight:  Fair  Psychomotor Activity:  Decreased  Concentration:  Concentration: Fair and Attention Span: Fair  Recall:  [employer] of Knowledge:  Fair  Language:  Good  Akathisia:  No  Handed:  Right  AIMS (if indicated):     Assets:  Housing Leisure Time Physical Health Resilience Social Support  ADL's:  Intact  Cognition:  WNL  Sleep:        Treatment Plan Summary: Daily contact with patient to assess and evaluate symptoms and progress in treatment, Medication management and Plan schizoaffective disorder, bipolar type:  -Crisis stabilization -Medication management:  Start Haldol 5 mg BID for psychosis and Cogentin 1 mg BID for EPS -Individual counseling  Disposition: Recommend psychiatric Inpatient admission when medically cleared.  Waylan Boga, NP 11/10/2015 8:25 AM   Patient seen face-to-face for psychiatric evaluation, chart reviewed and case discussed with the physician extender and developed treatment plan. Reviewed the information documented and agree with the  treatment plan. Corena Pilgrim, MD

## 2015-11-10 NOTE — BH Assessment (Signed)
[facility] Assessment Progress Note  Per Corena Pilgrim, MD, this pt requires psychiatric hospitalization at this time.  Pt has been referred to [facility].  At 15:34 this Probation officer called the [facility] and spoke to [place], who authorized referral, authorization [id number] from 11/10/2015 - 11/16/2015.  Please note that authorization does not mean that pt has been accepted to the facility.  At 15:42 I called Robinette at [facility], who took demographic information by telephone.  I then faxed referral information.  At 17:08 I called [place] and spoke to [facility].  She confirms that information has been received, but adds that they will need repeat CBC.  This Probation officer spoke to [employer] nurse, Nena Jordan, who reports that this is currently being drawn.  I also informed her that as of this writing urine findings are also pending, and she agrees to address this.  Loraine Leriche, LCSW, has also been informed of these developments.  Jalene Mullet, [place] Triage Specialist [phone]

## 2015-11-10 NOTE — ED Notes (Addendum)
Pt is irritable and withdrawn today.  Asked patient to give urine sample multiple times with no luck.  Pt denies S/I, H/I, and AVH.  She just stares at me and says "Do You Know Why I'm Here?"  15 minute checks and video monitoring continue.

## 2015-11-11 DIAGNOSIS — F250 Schizoaffective disorder, bipolar type: Secondary | ICD-10-CM | POA: Diagnosis not present

## 2015-11-11 DIAGNOSIS — R45851 Suicidal ideations: Secondary | ICD-10-CM | POA: Diagnosis not present

## 2015-11-11 MED ORDER — FLUPHENAZINE HCL 5 MG PO TABS
5.0000 mg | ORAL_TABLET | Freq: Two times a day (BID) | ORAL | Status: DC
  Administered 2015-11-11 – 2015-11-12 (×3): 5 mg via ORAL
  Filled 2015-11-11 (×3): qty 1

## 2015-11-11 NOTE — ED Notes (Signed)
Pt awake, alert & responsive, no distress noted, talking to room 41 at present in hallway.  Both pts redirected back to room. Monitoring for safety, Q 15 min checks in effect.

## 2015-11-11 NOTE — BH Assessment (Signed)
[place] Assessment Progress Note  At 16:19 Robinette at [facility] reports that pt has been added to their wait list.  Jalene Mullet, MA Triage Specialist [phone]

## 2015-11-11 NOTE — Consult Note (Signed)
[facility] Consult   Reason for Consult:  Depression, suicide attempt Referring Physician:  EDP Patient Identification: [name] MRN:  [id number] Principal Diagnosis: Schizoaffective disorder, bipolar type [facility]) Diagnosis:   Patient Active Problem List   Diagnosis Date Noted  . No diagnosis on Axis I [Z03.89] 08/21/2015  . Abnormal behavior [R68.89]   . Involuntary commitment [Z04.6]   . Schizophrenia ([place]) [F20.9]   . Disturbance in affect ([place]) [F39]   . Tobacco use disorder [F17.200] 05/12/2015  . Cannabis use disorder, moderate, dependence ([place]) [F12.20] 05/12/2015  . Hypertension [I10] 02/24/2011  . ANEMIA-IRON DEFICIENCY [D50.9] 12/13/2006  . GERD [K21.9] 12/13/2006  . OBESITY [E66.9] 12/12/2006  . Schizoaffective disorder, bipolar type ([place]) [F25.0] 12/12/2006  . HEMORRHOIDS, NOS [K64.9] 12/12/2006  . PAIN-NECK [M54.2] 12/12/2006  . TREMOR [R25.9] 12/12/2006    Total Time spent with patient: 45 minutes  Subjective:   [name] is a 45 y.o. female patient admitted with depression and suicide attempt.  HPI:  Per chart review:  45 y.o. single female who presents unaccompanied to [facility] ED after intentionally ingesting approximately 15 tablets of 4 mg perphenazine while in the waiting room of the ED.  Pt has a history of schizoaffective disorder, non complaint with medications as prescribed.  Pt reports a history of multiple suicide attempts. She reports auditory hallucinations.  She denies homicidal ideation or history of violence. She denies visual hallucinations.  Pt denies any use of alcohol or other substances;  blood alcohol level and urine drug screen are negative.    Pt has a history of presentations to the ED where she is selectively mute. Pt's medical record indicates she is followed by [facility] team. Pt cannot name her current outpatient providers. Pt acknowledges she was discharged from [facility] 10/28/15. Pt was last  inpatient at [facility] in January 2017.  Today:  Patient continues to endorse depression with suicidal ideations.  Reports auditory hallucination but mood incongruent.  Denies homicidal ideations and alcohol/drug abuse.  She was not as communicative.  She did however state that "Haldol made me shake".  And that "people bother her."  She does not appear to be thought blocking.    Past Psychiatric History: schizoaffective disorder, substance abuse  Risk to Self: Suicidal Ideation: Yes-Currently Present Suicidal Intent: Yes-Currently Present Is patient at risk for suicide?: Yes Suicidal Plan?: Yes-Currently Present Specify Current Suicidal Plan: Pt overdosed on medication today Access to Means: Yes Specify Access to Suicidal Means: Access to medications What has been your use of drugs/alcohol within the last 12 months?: Pt denies How many times?: 5 (Multiple attempts) Other Self Harm Risks: None Triggers for Past Attempts: Hallucinations Intentional Self Injurious Behavior: None Risk to Others: Homicidal Ideation: No Thoughts of Harm to Others: No Current Homicidal Intent: No Current Homicidal Plan: No Access to Homicidal Means: No Identified Victim: None History of harm to others?: No Assessment of Violence: None Noted Violent Behavior Description: No known history of violence Does patient have access to weapons?: No Criminal Charges Pending?: No Does patient have a court date: No Prior Inpatient Therapy: Prior Inpatient Therapy: Yes Prior Therapy Dates: 10/2015, multiple admits Prior Therapy Facilty/Provider(s): [facility], [facility] Reason for Treatment: Schizoaffective Disorder Prior Outpatient Therapy: Prior Outpatient Therapy: Yes Prior Therapy Dates: 2017 Prior Therapy Facilty/Provider(s): [facility] Reason for Treatment: Schizoaffective Disorder Does patient have an ACCT team?: Yes ([facility]) Does patient have Intensive In-House Services?  : No Does patient have [employer]  services? : No Does patient have P4CC services?: No  Past Medical History:  Past Medical History:  Diagnosis Date  . Arthritis   . Bipolar 1 disorder ([place])   . Bronchitis   . Depression   . Hemorrhoid   . Schizoaffective disorder ([place])   . Schizophrenia ([place])    Pt denies and reports it is schizoaffective disorder.   . Transfusion history     Past Surgical History:  Procedure Laterality Date  . CESAREAN SECTION     Family History:  Family History  Problem Relation Age of Onset  . Diabetes type II Other    Family Psychiatric  History: none Social History:  History  Alcohol Use No     History  Drug Use No    Comment: Former user    Social History   Social History  . Marital status: Legally Separated    Spouse name: N/A  . Number of children: N/A  . Years of education: N/A   Social History Main Topics  . Smoking status: Current Every Day Smoker    Packs/day: 0.50    Years: 17.00    Types: Cigarettes  . Smokeless tobacco: Never Used  . Alcohol use No  . Drug use: No     Comment: Former user  . Sexual activity: Not Asked   Other Topics Concern  . None   Social History Narrative   ** Merged History Encounter **       Additional Social History:    Allergies:   Allergies  Allergen Reactions  . Anette Guarneri [Lurasidone Hcl] Nausea And Vomiting and Other (See Comments)    Reaction:  Shaking   . Flagyl [Metronidazole Hcl] Nausea And Vomiting  . Sulfa Antibiotics Nausea And Vomiting    Labs:  Results for orders placed or performed during the hospital encounter of 11/09/15 (from the past 48 hour(s))  Comprehensive metabolic panel     Status: Abnormal   Collection Time: 11/09/15  9:58 PM  Result Value Ref Range   Sodium 139 135 - 145 mmol/L   Potassium 3.1 (L) 3.5 - 5.1 mmol/L   Chloride 108 101 - 111 mmol/L   CO2 24 22 - 32 mmol/L   Glucose, Bld 104 (H) 65 - 99 mg/dL   BUN 12 6 - 20 mg/dL   Creatinine, Ser 0.72 0.44 - 1.00 mg/dL   Calcium 9.0 8.9 -  10.3 mg/dL   Total Protein 7.9 6.5 - 8.1 g/dL   Albumin 4.0 3.5 - 5.0 g/dL   AST 18 15 - 41 U/L   ALT 11 (L) 14 - 54 U/L   Alkaline Phosphatase 64 38 - 126 U/L   Total Bilirubin 0.6 0.3 - 1.2 mg/dL   GFR calc non Af Amer >60 >60 mL/min   GFR calc Af Amer >60 >60 mL/min    Comment: (NOTE) The eGFR has been calculated using the CKD EPI equation. This calculation has not been validated in all clinical situations. eGFR's persistently <60 mL/min signify possible Chronic Kidney Disease.    Anion gap 7 5 - 15  Ethanol     Status: None   Collection Time: 11/09/15  9:58 PM  Result Value Ref Range   Alcohol, Ethyl (B) <5 <5 mg/dL    Comment:        LOWEST DETECTABLE LIMIT FOR SERUM ALCOHOL IS 5 mg/dL FOR MEDICAL PURPOSES ONLY   Salicylate level     Status: None   Collection Time: 11/09/15  9:58 PM  Result Value Ref Range   Salicylate Lvl <6.3 2.8 - 30.0 mg/dL  cbc     Status: Abnormal   Collection Time: 11/09/15  9:58 PM  Result Value Ref Range   WBC 11.1 (H) 4.0 - 10.5 K/uL   RBC 3.23 (L) 3.87 - 5.11 MIL/uL   Hemoglobin 7.6 (L) 12.0 - 15.0 g/dL   HCT 24.2 (L) 36.0 - 46.0 %   MCV 74.9 (L) 78.0 - 100.0 fL   MCH 23.5 (L) 26.0 - 34.0 pg   MCHC 31.4 30.0 - 36.0 g/dL   RDW 18.9 (H) 11.5 - 15.5 %   Platelets 406 (H) 150 - 400 K/uL  Acetaminophen level     Status: Abnormal   Collection Time: 11/10/15 12:11 AM  Result Value Ref Range   Acetaminophen (Tylenol), Serum <10 (L) 10 - 30 ug/mL    Comment:        THERAPEUTIC CONCENTRATIONS VARY SIGNIFICANTLY. A RANGE OF 10-30 ug/mL MAY BE AN EFFECTIVE CONCENTRATION FOR MANY PATIENTS. HOWEVER, SOME ARE BEST TREATED AT CONCENTRATIONS OUTSIDE THIS RANGE. ACETAMINOPHEN CONCENTRATIONS >150 ug/mL AT 4 HOURS AFTER INGESTION AND >50 ug/mL AT 12 HOURS AFTER INGESTION ARE OFTEN ASSOCIATED WITH TOXIC REACTIONS.   I-Stat beta hCG blood, ED     Status: None   Collection Time: 11/10/15 12:16 AM  Result Value Ref Range   I-stat hCG,  quantitative <5.0 <5 mIU/mL   Comment 3            Comment:   GEST. AGE      CONC.  (mIU/mL)   <=1 WEEK        5 - 50     2 WEEKS       50 - 500     3 WEEKS       100 - 10,000     4 WEEKS     1,000 - 30,000        FEMALE AND NON-PREGNANT FEMALE:     LESS THAN 5 mIU/mL   CBC     Status: Abnormal   Collection Time: 11/10/15  5:15 PM  Result Value Ref Range   WBC 8.4 4.0 - 10.5 K/uL   RBC 3.41 (L) 3.87 - 5.11 MIL/uL   Hemoglobin 8.0 (L) 12.0 - 15.0 g/dL   HCT 26.9 (L) 36.0 - 46.0 %   MCV 78.9 78.0 - 100.0 fL   MCH 23.5 (L) 26.0 - 34.0 pg   MCHC 29.7 (L) 30.0 - 36.0 g/dL   RDW 19.1 (H) 11.5 - 15.5 %   Platelets 416 (H) 150 - 400 K/uL  Urine rapid drug screen (hosp performed)     Status: None   Collection Time: 11/10/15  8:00 PM  Result Value Ref Range   Opiates NONE DETECTED NONE DETECTED   Cocaine NONE DETECTED NONE DETECTED   Benzodiazepines NONE DETECTED NONE DETECTED   Amphetamines NONE DETECTED NONE DETECTED   Tetrahydrocannabinol NONE DETECTED NONE DETECTED   Barbiturates NONE DETECTED NONE DETECTED    Comment:        DRUG SCREEN FOR MEDICAL PURPOSES ONLY.  IF CONFIRMATION IS NEEDED FOR ANY PURPOSE, NOTIFY LAB WITHIN 5 DAYS.        LOWEST DETECTABLE LIMITS FOR URINE DRUG SCREEN Drug Class       Cutoff (ng/mL) Amphetamine      1000 Barbiturate      200 Benzodiazepine   875 Tricyclics       643 Opiates  300 Cocaine          300 THC              50     Current Facility-Administered Medications  Medication Dose Route Frequency Provider Last Rate Last Dose  . acetaminophen (TYLENOL) tablet 650 mg  650 mg Oral Q4H PRN Sherwood Gambler, MD      . benztropine (COGENTIN) tablet 1 mg  1 mg Oral BID Patrecia Pour, NP      . haloperidol (HALDOL) tablet 5 mg  5 mg Oral BID Patrecia Pour, NP      . ibuprofen (ADVIL,MOTRIN) tablet 600 mg  600 mg Oral Q8H PRN Sherwood Gambler, MD      . nicotine (NICODERM CQ - dosed in mg/24 hours) patch 21 mg  21 mg Transdermal Daily  Sherwood Gambler, MD      . ondansetron (ZOFRAN) tablet 4 mg  4 mg Oral Q8H PRN Sherwood Gambler, MD      . zolpidem (AMBIEN) tablet 5 mg  5 mg Oral QHS PRN Sherwood Gambler, MD       Current Outpatient Prescriptions  Medication Sig Dispense Refill  . benztropine (COGENTIN) 1 MG tablet Take 1 tablet (1 mg total) by mouth 2 (two) times daily. (Patient not taking: Reported on 11/09/2015) 60 tablet 0  . haloperidol (HALDOL) 5 MG tablet Take 1 tablet (5 mg total) by mouth 2 (two) times daily. (Patient not taking: Reported on 11/09/2015) 60 tablet 0    Musculoskeletal: Strength & Muscle Tone: within normal limits Gait & Station: normal Patient leans: N/A  Psychiatric Specialty Exam: Physical Exam  Constitutional: She is oriented to person, place, and time. She appears well-developed and well-nourished.  HENT:  Head: Normocephalic.  Neck: Normal range of motion.  Respiratory: Effort normal.  Musculoskeletal: Normal range of motion.  Neurological: She is alert and oriented to person, place, and time.  Skin: Skin is warm and dry.  Psychiatric: Her speech is normal. She is actively hallucinating. Cognition and memory are normal. She expresses impulsivity. She exhibits a depressed mood. She expresses suicidal ideation. She expresses suicidal plans.    Review of Systems  Constitutional: Negative.   HENT: Negative.   Eyes: Negative.   Respiratory: Negative.   Cardiovascular: Negative.   Gastrointestinal: Negative.   Genitourinary: Negative.   Musculoskeletal: Negative.   Skin: Negative.   Neurological: Negative.   Endo/Heme/Allergies: Negative.   Psychiatric/Behavioral: Positive for depression, hallucinations and suicidal ideas.    Blood pressure 100/62, pulse 70, temperature 99.1 F (37.3 C), temperature source Oral, resp. rate 16, SpO2 98 %.There is no height or weight on file to calculate BMI.  General Appearance: Casual  Eye Contact:  Fair  Speech:  Normal Rate  Volume:  Decreased   Mood:  Depressed  Affect:  Congruent  Thought Process:  Coherent and Descriptions of Associations: Intact  Orientation:  Full (Time, Place, and Person)  Thought Content:  Hallucinations: Auditory  Suicidal Thoughts:  Yes.  with intent/plan  Homicidal Thoughts:  No  Memory:  Immediate;   Fair Recent;   Fair Remote;   Fair  Judgement:  Impaired  Insight:  Fair  Psychomotor Activity:  Decreased  Concentration:  Concentration: Fair and Attention Span: Fair  Recall:  [employer] of Knowledge:  Fair  Language:  Good  Akathisia:  No  Handed:  Right  AIMS (if indicated):     Assets:  Housing Leisure Time Physical Health Resilience Social Support  ADL's:  Intact  Cognition:  WNL  Sleep:      Treatment Plan Summary: Daily contact with patient to assess and evaluate symptoms and progress in treatment, Medication management and Plan schizoaffective disorder, bipolar type:  -Crisis stabilization -Medication management:  Start Prolixin 5 mg BID and DC Haldol 5 mg BID for psychosis and Cogentin 1 mg BID for EPS -Individual counseling  Disposition: Recommend psychiatric Inpatient admission when medically cleared. Seeking inpatient bed  [facility], NP [facility] 11/11/2015 1:14 PM  Patient seen face-to-face for psychiatric evaluation, chart reviewed and case discussed with the physician extender and developed treatment plan. Reviewed the information documented and agree with the treatment plan. Corena Pilgrim, MD

## 2015-11-11 NOTE — ED Notes (Signed)
Pt has been up and interacting today.  She took her prolixin willingly.  She is being pleasant and cooperative.  She denies S/I, H/I, and AVH.

## 2015-11-11 NOTE — Progress Notes (Signed)
CSW spoke with Hubbard at [facility] to see if patient was on there wait list. He stated he spoke with the Nurse and they are waiting for a repeat CBC. He stated once they received the CBC, we would receive a call.   Genice Rouge [id number] ED CSW 11/11/2015 9:13 AM

## 2015-11-12 MED ORDER — DIPHENHYDRAMINE HCL 50 MG/ML IJ SOLN
50.0000 mg | Freq: Once | INTRAMUSCULAR | Status: AC
  Administered 2015-11-12: 50 mg via INTRAMUSCULAR
  Filled 2015-11-12: qty 1

## 2015-11-12 MED ORDER — FLUPHENAZINE DECANOATE 25 MG/ML IJ SOLN
12.5000 mg | INTRAMUSCULAR | Status: DC
  Administered 2015-11-12: 12.5 mg via INTRAMUSCULAR
  Filled 2015-11-12: qty 0.5

## 2015-11-12 NOTE — Discharge Instructions (Signed)
For your ongoing behavioral health needs, you are advised to continue treatment with [facility], your ACT Team provider:       [facility]      [facility], [address]      [address]      [place], [place]  [address]      [phone]

## 2015-11-12 NOTE — ED Notes (Signed)
Patient refused vitals Rn Latricia made aware of refusal

## 2015-11-12 NOTE — BH Assessment (Signed)
[facility] Assessment Progress Note  Per Corena Pilgrim, MD, this pt no longer requires psychiatric hospitalization.  She is to be started on injectable Prolixin this morning, and held in the ED until afternoon, at which time she will be ready for discharge to [facility], her outpatient provider.  At 15:15 Catalina Pizza, NP, reports that pt is ready for discharge.  At 15:19 this Probation officer called [facility] and spoke to Rob, reporting to him that pt is ready for discharge from the ED.  He agrees to discuss this with his staff and to call back to [facility] with arrangements.  Discharge instructions advise pt to continue treatment with [facility].  Pt's nurse, Jan, has been notified.  At 15:24 Rob calls back to report that an ACT Team staff member will come this afternoon to pick pt up.  Jalene Mullet, [place] Triage Specialist [phone]

## 2015-11-12 NOTE — ED Notes (Signed)
Pt has been calm in her room this morning. She denies si and hi. Pt agreed with MD to take injectable prolixin. Safety maintained in the [facility].

## 2015-11-12 NOTE — BHH Suicide Risk Assessment (Cosign Needed)
[facility] Discharge Suicide Risk Assessment    Principal Problem: Schizoaffective disorder, bipolar type [facility]) Discharge Diagnoses:  Patient Active Problem List   Diagnosis Date Noted  . No diagnosis on Axis I [Z03.89] 08/21/2015  . Abnormal behavior [R68.89]   . Involuntary commitment [Z04.6]   . Schizophrenia ([place]) [F20.9]   . Disturbance in affect ([place]) [F39]   . Tobacco use disorder [F17.200] 05/12/2015  . Cannabis use disorder, moderate, dependence ([place]) [F12.20] 05/12/2015  . Hypertension [I10] 02/24/2011  . ANEMIA-IRON DEFICIENCY [D50.9] 12/13/2006  . GERD [K21.9] 12/13/2006  . OBESITY [E66.9] 12/12/2006  . Schizoaffective disorder, bipolar type ([place]) [F25.0] 12/12/2006  . HEMORRHOIDS, NOS [K64.9] 12/12/2006  . PAIN-NECK [M54.2] 12/12/2006  . TREMOR [R25.9] 12/12/2006    Total Time spent with patient: 30 minutes  Musculoskeletal: Strength & Muscle Tone: within normal limits Gait & Station: normal Patient leans: N/A  Psychiatric Specialty Exam:   Blood pressure 114/60, pulse 70, temperature 98.9 F (37.2 C), temperature source Oral, resp. rate 18, SpO2 100 %.There is no height or weight on file to calculate BMI.    General Appearance: Casual and fairly groomed  Eye Contact:  Fair  Speech:  Normal Rate  Volume:  Decreased  Mood:  Depressed  Affect:  Congruent  Thought Process:  Coherent and Descriptions of Associations: Intact  Orientation:  Full (Time, Place, and Person)  Thought Content:  Denies hallucinations, states medication is helping greatly  Suicidal Thoughts:  No and contracts for safety  Homicidal Thoughts:  No  Memory:  Immediate;   Fair Recent;   Fair Remote;   Fair  Judgement:  Impaired  Insight:  Fair  Psychomotor Activity:  Decreased  Concentration:  Concentration: Good and Attention Span: Good  Recall:  [employer] of Knowledge:  Fair  Language:  Good  Akathisia:  No  Handed:  Right  AIMS (if indicated):     Assets:  Housing Leisure  Time Physical Health Resilience Social Support  ADL's:  Intact  Cognition:  WNL  Sleep:         Mental Status Per Nursing Assessment::   On Admission:     Demographic Factors:  Low socioeconomic status  Loss Factors: Financial problems/change in socioeconomic status  Historical Factors: Impulsivity  Risk Reduction Factors:   Positive therapeutic relationship  Continued Clinical Symptoms:  Depression:   Anhedonia Delusional  Cognitive Features That Contribute To Risk:  Closed-mindedness    Suicide Risk:  Mild:  Suicidal ideation of limited frequency, intensity, duration, and specificity.  There are no identifiable plans, no associated intent, mild dysphoria and related symptoms, good self-control (both objective and subjective assessment), few other risk factors, and identifiable protective factors, including available and accessible social support.  Plan Of Care/Follow-up recommendations:  Activity:  As tolerated Diet:  Heart healthy with low sodium.  Benjamine Mola, FNP 11/12/2015, 4:10 PM

## 2015-11-12 NOTE — BH Assessment (Signed)
[place] Assessment Progress Note   At 09:09 [name] at [facility] reports that pt remains on their wait list.  Jalene Mullet, [place] Triage Specialist [phone]

## 2015-11-12 NOTE — Consult Note (Signed)
[facility] Psychiatry Consult   Reason for Consult:  Depression, suicide attempt Referring Physician:  EDP Patient Identification: [name] MRN:  [id number] Principal Diagnosis: Schizoaffective disorder, bipolar type [facility]) Diagnosis:   Patient Active Problem List   Diagnosis Date Noted  . No diagnosis on Axis I [Z03.89] 08/21/2015  . Abnormal behavior [R68.89]   . Involuntary commitment [Z04.6]   . Schizophrenia ([place]) [F20.9]   . Disturbance in affect ([place]) [F39]   . Tobacco use disorder [F17.200] 05/12/2015  . Cannabis use disorder, moderate, dependence ([place]) [F12.20] 05/12/2015  . Hypertension [I10] 02/24/2011  . ANEMIA-IRON DEFICIENCY [D50.9] 12/13/2006  . GERD [K21.9] 12/13/2006  . OBESITY [E66.9] 12/12/2006  . Schizoaffective disorder, bipolar type ([place]) [F25.0] 12/12/2006  . HEMORRHOIDS, NOS [K64.9] 12/12/2006  . PAIN-NECK [M54.2] 12/12/2006  . TREMOR [R25.9] 12/12/2006    Total Time spent with patient: 30 minutes  Subjective:   [name] is a 45 y.o. female patient admitted with depression and anxiety along with medication non-compliance. Pt seen and chart reviewed. Pt is alert/oriented x4, calm, cooperative, and appropriate to situation. Pt denies suicidal/homicidal ideation and psychosis and does not appear to be responding to internal stimuli. Pt reports that her medication is helping greatly and that she is no longer suicidal "because I'm not hearing the voices and that's what really makes me want to hurt myself."  HPI:  I have reviewed and concur with HPI elements below, modified as follows:  44 y.o. single female who presents unaccompanied to [facility] ED after intentionally ingesting approximately 15 tablets of 4 mg perphenazine while in the waiting room of the ED.  Pt has a history of schizoaffective disorder, non complaint with medications as prescribed.  Pt reports a history of multiple suicide attempts. She reports auditory hallucinations.   She denies homicidal ideation or history of violence. She denies visual hallucinations.  Pt denies any use of alcohol or other substances;  blood alcohol level and urine drug screen are negative.    Pt has a history of presentations to the ED where she is selectively mute. Pt's medical record indicates she is followed by [facility] team. Pt cannot name her current outpatient providers. Pt acknowledges she was discharged from [facility] 10/28/15. Pt was last inpatient at [facility] in January 2017.  Today on 11/12/15, pt reports a major improvement and appears to be at or near baseline, stable for outpatient management with ACT Team.   Past Psychiatric History: schizoaffective disorder, substance abuse  Risk to Self: Suicidal Ideation: Yes-Currently Present Suicidal Intent: Yes-Currently Present Is patient at risk for suicide?: Yes Suicidal Plan?: Yes-Currently Present Specify Current Suicidal Plan: Pt overdosed on medication today Access to Means: Yes Specify Access to Suicidal Means: Access to medications What has been your use of drugs/alcohol within the last 12 months?: Pt denies How many times?: 5 (Multiple attempts) Other Self Harm Risks: None Triggers for Past Attempts: Hallucinations Intentional Self Injurious Behavior: None Risk to Others: Homicidal Ideation: No Thoughts of Harm to Others: No Current Homicidal Intent: No Current Homicidal Plan: No Access to Homicidal Means: No Identified Victim: None History of harm to others?: No Assessment of Violence: None Noted Violent Behavior Description: No known history of violence Does patient have access to weapons?: No Criminal Charges Pending?: No Does patient have a court date: No Prior Inpatient Therapy: Prior Inpatient Therapy: Yes Prior Therapy Dates: 10/2015, multiple admits Prior Therapy Facilty/Provider(s): [place], [facility] Reason for Treatment: Schizoaffective Disorder Prior Outpatient  Therapy: Prior Outpatient Therapy:  Yes Prior Therapy Dates: 2017 Prior Therapy Facilty/Provider(s): [facility] Reason for Treatment: Schizoaffective Disorder Does patient have an ACCT team?: Yes ([facility]) Does patient have Intensive In-House Services?  : No Does patient have [facility] services? : No Does patient have P4[facility] services?: No  Past Medical History:  Past Medical History:  Diagnosis Date  . Arthritis   . Bipolar 1 disorder ([place])   . Bronchitis   . Depression   . Hemorrhoid   . Schizoaffective disorder ([place])   . Schizophrenia ([place])    Pt denies and reports it is schizoaffective disorder.   . Transfusion history     Past Surgical History:  Procedure Laterality Date  . CESAREAN SECTION     Family History:  Family History  Problem Relation Age of Onset  . Diabetes type II Other    Family Psychiatric  History: none Social History:  History  Alcohol Use No     History  Drug Use No    Comment: Former user    Social History   Social History  . Marital status: Legally Separated    Spouse name: N/A  . Number of children: N/A  . Years of education: N/A   Social History Main Topics  . Smoking status: Current Every Day Smoker    Packs/day: 0.50    Years: 17.00    Types: Cigarettes  . Smokeless tobacco: Never Used  . Alcohol use No  . Drug use: No     Comment: Former user  . Sexual activity: Not Asked   Other Topics Concern  . None   Social History Narrative   ** Merged History Encounter **       Additional Social History:    Allergies:   Allergies  Allergen Reactions  . Anette Guarneri [Lurasidone Hcl] Nausea And Vomiting and Other (See Comments)    Reaction:  Shaking   . Flagyl [Metronidazole Hcl] Nausea And Vomiting  . Sulfa Antibiotics Nausea And Vomiting    Labs:  Results for orders placed or performed during the hospital encounter of 11/09/15 (from the past 48 hour(s))  CBC     Status: Abnormal   Collection Time: 11/10/15  5:15 PM  Result Value Ref Range   WBC 8.4 4.0 - 10.5 K/uL    RBC 3.41 (L) 3.87 - 5.11 MIL/uL   Hemoglobin 8.0 (L) 12.0 - 15.0 g/dL   HCT 26.9 (L) 36.0 - 46.0 %   MCV 78.9 78.0 - 100.0 fL   MCH 23.5 (L) 26.0 - 34.0 pg   MCHC 29.7 (L) 30.0 - 36.0 g/dL   RDW 19.1 (H) 11.5 - 15.5 %   Platelets 416 (H) 150 - 400 K/uL  Urine rapid drug screen (hosp performed)     Status: None   Collection Time: 11/10/15  8:00 PM  Result Value Ref Range   Opiates NONE DETECTED NONE DETECTED   Cocaine NONE DETECTED NONE DETECTED   Benzodiazepines NONE DETECTED NONE DETECTED   Amphetamines NONE DETECTED NONE DETECTED   Tetrahydrocannabinol NONE DETECTED NONE DETECTED   Barbiturates NONE DETECTED NONE DETECTED    Comment:        DRUG SCREEN FOR MEDICAL PURPOSES ONLY.  IF CONFIRMATION IS NEEDED FOR ANY PURPOSE, NOTIFY LAB WITHIN 5 DAYS.        LOWEST DETECTABLE LIMITS FOR URINE DRUG SCREEN Drug Class       Cutoff (ng/mL) Amphetamine      1000 Barbiturate  200 Benzodiazepine   [id number] Tricyclics       [id number] Opiates          300 Cocaine          300 THC              50     Current Facility-Administered Medications  Medication Dose Route Frequency Provider Last Rate Last Dose  . acetaminophen (TYLENOL) tablet 650 mg  650 mg Oral Q4H PRN Sherwood Gambler, MD      . benztropine (COGENTIN) tablet 1 mg  1 mg Oral BID Patrecia Pour, NP   1 mg at 11/12/15 1100  . fluPHENAZine (PROLIXIN) tablet 5 mg  5 mg Oral Q12H Kerrie Buffalo, NP   5 mg at 11/12/15 1100  . ibuprofen (ADVIL,MOTRIN) tablet 600 mg  600 mg Oral Q8H PRN Sherwood Gambler, MD      . nicotine (NICODERM CQ - dosed in mg/24 hours) patch 21 mg  21 mg Transdermal Daily Sherwood Gambler, MD      . ondansetron (ZOFRAN) tablet 4 mg  4 mg Oral Q8H PRN Sherwood Gambler, MD      . zolpidem (AMBIEN) tablet 5 mg  5 mg Oral QHS PRN Sherwood Gambler, MD       Current Outpatient Prescriptions  Medication Sig Dispense Refill  . benztropine (COGENTIN) 1 MG tablet Take 1 tablet (1 mg total) by mouth 2 (two) times daily. (Patient  not taking: Reported on 11/09/2015) 60 tablet 0  . haloperidol (HALDOL) 5 MG tablet Take 1 tablet (5 mg total) by mouth 2 (two) times daily. (Patient not taking: Reported on 11/09/2015) 60 tablet 0    Musculoskeletal: Strength & Muscle Tone: within normal limits Gait & Station: normal Patient leans: N/A  Psychiatric Specialty Exam: Physical Exam  Reviewed EDP exam and WNL  Review of Systems  Constitutional: Negative.   HENT: Negative.   Eyes: Negative.   Respiratory: Negative.   Cardiovascular: Negative.   Gastrointestinal: Negative.   Genitourinary: Negative.   Musculoskeletal: Negative.   Skin: Negative.   Neurological: Negative.   Endo/Heme/Allergies: Negative.   Psychiatric/Behavioral: Positive for depression. Negative for hallucinations and suicidal ideas. The patient is nervous/anxious and has insomnia.   All other systems reviewed and are negative.   Blood pressure 113/65, pulse 60, temperature 98.2 F (36.8 C), temperature source Oral, resp. rate 17, SpO2 98 %.There is no height or weight on file to calculate BMI.  General Appearance: Casual and fairly groomed  Eye Contact:  Fair  Speech:  Normal Rate  Volume:  Decreased  Mood:  Depressed  Affect:  Congruent  Thought Process:  Coherent and Descriptions of Associations: Intact  Orientation:  Full (Time, Place, and Person)  Thought Content:  Denies hallucinations, states medication is helping greatly  Suicidal Thoughts:  No and contracts for safety  Homicidal Thoughts:  No  Memory:  Immediate;   Fair Recent;   Fair Remote;   Fair  Judgement:  Impaired  Insight:  Fair  Psychomotor Activity:  Decreased  Concentration:  Concentration: Good and Attention Span: Good  Recall:  [employer] of Knowledge:  Fair  Language:  Good  Akathisia:  No  Handed:  Right  AIMS (if indicated):     Assets:  Housing Leisure Time Physical Health Resilience Social Support  ADL's:  Intact  Cognition:  WNL  Sleep:      Treatment  Plan Summary: Schizoaffective disorder, bipolar type ([place]) stable for outpatient management.   Medications:  -  Prolixin decanoate 12.5mg  IM every 14 days (may titrate to every 21-28 days depending on pt response outpatient) -Prolixin 5mg  po q12h (my titrate down with clinical discretion after 72-96 hours of injection reaching serum therapeutic levels) -Cogentin 1mg  po bid for EPS provention  Disposition: Discharge home to [facility] ACT Team  Benjamine Mola, FNP [facility] 11/12/2015 11:28 AM  Patient seen face-to-face for psychiatric evaluation, chart reviewed and case discussed with the physician extender and developed treatment plan. Reviewed the information documented and agree with the treatment plan. Corena Pilgrim, MD

## 2015-11-12 NOTE — ED Notes (Signed)
Pt d/c with ACT team member. All items returned. Pt denies si and hi.

## 2015-12-23 ENCOUNTER — Emergency Department (HOSPITAL_COMMUNITY)
Admission: EM | Admit: 2015-12-23 | Discharge: 2015-12-24 | Disposition: A | Discharge status: Home / Self Care | Payer: Medicaid Other | Attending: Emergency Medicine | Admitting: Emergency Medicine

## 2015-12-23 ENCOUNTER — Emergency Department (HOSPITAL_COMMUNITY): Payer: Medicaid Other

## 2015-12-23 DIAGNOSIS — F17210 Nicotine dependence, cigarettes, uncomplicated: Secondary | ICD-10-CM | POA: Insufficient documentation

## 2015-12-23 DIAGNOSIS — F250 Schizoaffective disorder, bipolar type: Secondary | ICD-10-CM | POA: Diagnosis not present

## 2015-12-23 DIAGNOSIS — Z046 Encounter for general psychiatric examination, requested by authority: Secondary | ICD-10-CM | POA: Diagnosis present

## 2015-12-23 DIAGNOSIS — Z79899 Other long term (current) drug therapy: Secondary | ICD-10-CM | POA: Insufficient documentation

## 2015-12-23 DIAGNOSIS — F23 Brief psychotic disorder: Secondary | ICD-10-CM | POA: Insufficient documentation

## 2015-12-23 LAB — RAPID URINE DRUG SCREEN, HOSP PERFORMED
Amphetamines: NOT DETECTED
BARBITURATES: NOT DETECTED
Benzodiazepines: NOT DETECTED
Cocaine: NOT DETECTED
Opiates: NOT DETECTED
Tetrahydrocannabinol: NOT DETECTED

## 2015-12-23 LAB — CBC WITH DIFFERENTIAL/PLATELET
BASOS ABS: 0.1 10*3/uL (ref 0.0–0.1)
Basophils Relative: 1 %
EOS PCT: 1 %
Eosinophils Absolute: 0.1 10*3/uL (ref 0.0–0.7)
HEMATOCRIT: 24.8 % — AB (ref 36.0–46.0)
Hemoglobin: 7.9 g/dL — ABNORMAL LOW (ref 12.0–15.0)
LYMPHS PCT: 40 %
Lymphs Abs: 3.4 10*3/uL (ref 0.7–4.0)
MCH: 23.3 pg — AB (ref 26.0–34.0)
MCHC: 31.9 g/dL (ref 30.0–36.0)
MCV: 73.2 fL — AB (ref 78.0–100.0)
MONO ABS: 0.5 10*3/uL (ref 0.1–1.0)
MONOS PCT: 6 %
NEUTROS ABS: 4.7 10*3/uL (ref 1.7–7.7)
Neutrophils Relative %: 54 %
PLATELETS: 341 10*3/uL (ref 150–400)
RBC: 3.39 MIL/uL — ABNORMAL LOW (ref 3.87–5.11)
RDW: 18.5 % — AB (ref 11.5–15.5)
WBC: 8.7 10*3/uL (ref 4.0–10.5)

## 2015-12-23 LAB — COMPREHENSIVE METABOLIC PANEL
ALBUMIN: 4.1 g/dL (ref 3.5–5.0)
ALK PHOS: 65 U/L (ref 38–126)
ALT: 7 U/L — AB (ref 14–54)
ANION GAP: 6 (ref 5–15)
AST: 14 U/L — ABNORMAL LOW (ref 15–41)
BILIRUBIN TOTAL: 0.5 mg/dL (ref 0.3–1.2)
BUN: 9 mg/dL (ref 6–20)
CALCIUM: 9 mg/dL (ref 8.9–10.3)
CO2: 26 mmol/L (ref 22–32)
CREATININE: 0.89 mg/dL (ref 0.44–1.00)
Chloride: 107 mmol/L (ref 101–111)
GFR calc non Af Amer: >60 mL/min (ref >60)
GLUCOSE: 95 mg/dL (ref 65–99)
Potassium: 3.1 mmol/L — ABNORMAL LOW (ref 3.5–5.1)
Sodium: 139 mmol/L (ref 135–145)
TOTAL PROTEIN: 8.1 g/dL (ref 6.5–8.1)

## 2015-12-23 LAB — URINALYSIS, ROUTINE W REFLEX MICROSCOPIC
Bilirubin Urine: NEGATIVE
GLUCOSE, UA: NEGATIVE mg/dL
Hgb urine dipstick: NEGATIVE
KETONES UR: NEGATIVE mg/dL
LEUKOCYTES UA: NEGATIVE
NITRITE: NEGATIVE
PROTEIN: NEGATIVE mg/dL
Specific Gravity, Urine: 1.027 (ref 1.005–1.030)
pH: 6 (ref 5.0–8.0)

## 2015-12-23 LAB — ACETAMINOPHEN LEVEL: Acetaminophen (Tylenol), Serum: <10 ug/mL — ABNORMAL LOW (ref 10–30)

## 2015-12-23 LAB — SALICYLATE LEVEL

## 2015-12-23 LAB — PREGNANCY, URINE: PREG TEST UR: NEGATIVE

## 2015-12-23 LAB — ETHANOL: Alcohol, Ethyl (B): <5 mg/dL (ref <5)

## 2015-12-23 MED ORDER — LORAZEPAM 1 MG PO TABS: 1.0000 mg | ORAL_TABLET | Freq: Three times a day (TID) | ORAL | Status: DC | PRN

## 2015-12-23 MED ORDER — IBUPROFEN 200 MG PO TABS: 600.0000 mg | ORAL_TABLET | Freq: Three times a day (TID) | ORAL | Status: DC | PRN

## 2015-12-23 MED ORDER — ACETAMINOPHEN 325 MG PO TABS: 650.0000 mg | ORAL_TABLET | ORAL | Status: DC | PRN

## 2015-12-23 MED ORDER — HALOPERIDOL 5 MG PO TABS
5.0000 mg | ORAL_TABLET | Freq: Two times a day (BID) | ORAL | Status: DC
  Filled 2015-12-23: qty 1

## 2015-12-23 MED ORDER — BENZTROPINE MESYLATE 1 MG PO TABS
1.0000 mg | ORAL_TABLET | Freq: Two times a day (BID) | ORAL | Status: DC
  Filled 2015-12-23: qty 1

## 2015-12-23 MED ORDER — ONDANSETRON HCL 4 MG PO TABS: 4.0000 mg | ORAL_TABLET | Freq: Three times a day (TID) | ORAL | Status: DC | PRN

## 2015-12-23 NOTE — ED Notes (Signed)
Patient alert and engages in minimal conversation with this Probation officer. Nods head no to SI/HI and states " I am ok." Patient denies pain. Q 15 minute checks in progress and monitoring of patient continues for safety. Patient remains safe on unit.

## 2015-12-23 NOTE — Progress Notes (Signed)
Patient noted to have had 9 ED visits within the last six months. Per chart review, patient last seen in ED on 07/26. Per chart review patient is followed by [facility] team in the community. Patient listed as having Medicaid [place] Access insurance with [facility] listed as her pcp on her insurance card.

## 2015-12-23 NOTE — ED Provider Notes (Signed)
[place] DEPT Provider Note   CSN: [id number] Arrival date & time: 12/23/15  1711     History   Chief Complaint Chief Complaint  Patient presents with  . Psychiatric Evaluation  Level V caveat due to psychiatric disorder.  HPI [name] is a 45 y.o. female.  The history is provided by the patient.  Patient presents reportedly asking for psychiatric help. She will not tell me why she is here. Denies any psychiatric history. Denies any complaints at this time. Not really forthcoming with history. States she is allergic to medicines. States she is not on any medicines.  Past Medical History:  Diagnosis Date  . Arthritis   . Bipolar 1 disorder ([place])   . Bronchitis   . Depression   . Hemorrhoid   . Schizoaffective disorder ([place])   . Schizophrenia ([place])    Pt denies and reports it is schizoaffective disorder.   . Transfusion history     Patient Active Problem List   Diagnosis Date Noted  . No diagnosis on Axis I 08/21/2015  . Abnormal behavior   . Involuntary commitment   . Schizophrenia ([place])   . Disturbance in affect ([place])   . Tobacco use disorder 05/12/2015  . Cannabis use disorder, moderate, dependence ([place]) 05/12/2015  . Hypertension 02/24/2011  . ANEMIA-IRON DEFICIENCY 12/13/2006  . GERD 12/13/2006  . OBESITY 12/12/2006  . Schizoaffective disorder, bipolar type ([place]) 12/12/2006  . HEMORRHOIDS, NOS 12/12/2006  . PAIN-NECK 12/12/2006  . TREMOR 12/12/2006    Past Surgical History:  Procedure Laterality Date  . CESAREAN SECTION      OB History    No data available       Home Medications    Prior to Admission medications   Medication Sig Start Date End Date Taking? Authorizing Provider  benztropine (COGENTIN) 1 MG tablet Take 1 tablet (1 mg total) by mouth 2 (two) times daily. Patient not taking: Reported on 11/09/2015 08/14/15   Delfin Gant, NP  haloperidol (HALDOL) 5 MG tablet Take 1 tablet (5 mg total) by mouth 2 (two) times  daily. Patient not taking: Reported on 11/09/2015 08/14/15   Delfin Gant, NP    Family History Family History  Problem Relation Age of Onset  . Diabetes type II Other     Social History Social History  Substance Use Topics  . Smoking status: Current Every Day Smoker    Packs/day: 0.50    Years: 17.00    Types: Cigarettes  . Smokeless tobacco: Never Used  . Alcohol use No     Allergies   Latuda [lurasidone hcl]; Flagyl [metronidazole hcl]; and Sulfa antibiotics   Review of Systems Review of Systems  Unable to perform ROS: Psychiatric disorder     Physical Exam Updated Vital Signs BP (!) 141/111 (BP Location: Right Arm)   Pulse 77   Temp 99 F (37.2 C) (Oral)   Resp 18   LMP 12/03/2015 (Approximate)   SpO2 100%   Physical Exam  Constitutional: She appears well-developed.  HENT:  Head: Atraumatic.  Eyes: Pupils are equal, round, and reactive to light.  Neck: Neck supple.  Cardiovascular: Normal rate.   Pulmonary/Chest: Effort normal.  Abdominal: Soft. There is no tenderness.  Musculoskeletal: She exhibits no tenderness.  Neurological: She is alert.  Skin: Skin is warm. Capillary refill takes less than 2 seconds.  Psychiatric:  Patient with flat affect. Somewhat slow to answer questions. Moving all extremities.     ED Treatments / Results  Labs (all labs ordered are listed, but only abnormal results are displayed) Labs Reviewed  COMPREHENSIVE METABOLIC PANEL - Abnormal; Notable for the following:       Result Value   Potassium 3.1 (*)    AST 14 (*)    ALT 7 (*)    All other components within normal limits  ACETAMINOPHEN LEVEL - Abnormal; Notable for the following:    Acetaminophen (Tylenol), Serum <10 (*)    All other components within normal limits  CBC WITH DIFFERENTIAL/PLATELET - Abnormal; Notable for the following:    RBC 3.39 (*)    Hemoglobin 7.9 (*)    HCT 24.8 (*)    MCV 73.2 (*)    MCH 23.3 (*)    RDW 18.5 (*)    All other  components within normal limits  URINALYSIS, ROUTINE W REFLEX MICROSCOPIC (NOT AT [facility]) - Abnormal; Notable for the following:    APPearance CLOUDY (*)    All other components within normal limits  ETHANOL  SALICYLATE LEVEL  URINE RAPID DRUG SCREEN, HOSP PERFORMED  PREGNANCY, URINE    EKG  EKG Interpretation None       Radiology Dg Chest Portable 1 View  Result Date: 12/23/2015 CLINICAL DATA:  Altered mental status EXAM: PORTABLE CHEST 1 VIEW COMPARISON:  05/21/2014 FINDINGS: Mild enlargement of the cardiopericardial silhouette, without edema, but increased from prior. Linear subsegmental atelectasis or scarring in the lingula. No pleural effusion. IMPRESSION: 1. Mild enlargement of the cardiopericardial silhouette, without edema. The cardiac prominence is increased from prior, even accounting for the difference in projection. 2. Lingular scarring or subsegmental atelectasis. Electronically Signed   By: Van Clines M.D.   On: 12/23/2015 19:10    Procedures Procedures (including critical care time)  Medications Ordered in ED Medications - No data to display   Initial Impression / Assessment and Plan / ED Course  I have reviewed the triage vital signs and the nursing notes.  Pertinent labs & imaging results that were available during my care of the patient were reviewed by me and considered in my medical decision making (see chart for details).  Clinical Course    Patient presents with altered mental status. History of psychiatric disorders and that is likely what this is a this time. Lab work reassuring. I do not see other reason for mental status change in her workup. Appears medically cleared to be seen by TTS.  Hemoglobin is at her baseline.  Final Clinical Impressions(s) / ED Diagnoses   Final diagnoses:  Acute psychosis    New Prescriptions New Prescriptions   No medications on file     Davonna Belling, MD 12/23/15 2048

## 2015-12-23 NOTE — ED Notes (Signed)
Pt changed and wanded. Two bags of belongings labeled and placed at the nursing station.

## 2015-12-23 NOTE — ED Notes (Signed)
Pt seen coming from [facility], pt walked back to her room and reminded we need a urine sample. Pt does not respond only stares at Probation officer.

## 2015-12-23 NOTE — BH Assessment (Signed)
Assessment Note  Rhetta Wedlake is an 45 y.o. female with history of Bipolar I Disorder, Schizoaffective Disorder, and Schizophrenia. Patient was selective during the assessment and selective with responding to this writer's questions. Patient would selectively open eyes while in [place], notes for the assessment were obtained from patient's triage assessment. The history is limited by condition of patient. Patient did indicted that she called 911 and told them that someone was trying to harm her. [facility] and EMS arrived to patient's home. Patient was reportedly at home alone and did not appear in any harm. Patient sts that she is not hearing voices or seeing things. However sts, "They keep telling me that they will hurt me". Patient explains that the people who wants to harm her are "different people". Patient refused to elaborate any further. Patient denied SI and HI. No alcohol or drug use reported. Patient has multiple admissions with the last one on 11/04/2015. Case was staffed with Waylan Boga, DNP and inpatient was recommended.    Diagnosis: Schizoaffective disorder, bipolar type ([place]) per notes  Past Medical History:  Past Medical History:  Diagnosis Date  . Arthritis   . Bipolar 1 disorder ([place])   . Bronchitis   . Depression   . Hemorrhoid   . Schizoaffective disorder ([place])   . Schizophrenia ([place])    Pt denies and reports it is schizoaffective disorder.   . Transfusion history     Past Surgical History:  Procedure Laterality Date  . CESAREAN SECTION      Family History:  Family History  Problem Relation Age of Onset  . Diabetes type II Other     Social History:  reports that she has been smoking Cigarettes.  She has a 8.50 pack-year smoking history. She has never used smokeless tobacco. She reports that she does not drink alcohol or use drugs.  Additional Social History:  Alcohol / Drug Use Pain Medications: See MAR Prescriptions: See MAR Over the Counter: See MAR History  of alcohol / drug use?: No history of alcohol / drug abuse Longest period of sobriety (when/how long): NA Negative Consequences of Use: Work / Youth worker, Personal relationships  CIWA: CIWA-Ar BP: (!) 141/111 Pulse Rate: 77 COWS:    Allergies:  Allergies  Allergen Reactions  . Anette Guarneri [Lurasidone Hcl] Nausea And Vomiting and Other (See Comments)    Reaction:  Shaking   . Flagyl [Metronidazole Hcl] Nausea And Vomiting  . Sulfa Antibiotics Nausea And Vomiting    Home Medications:  (Not in a hospital admission)  OB/GYN Status:  Patient's last menstrual period was 12/03/2015 (approximate).  General Assessment Data [facility] Assessment: In system Is this a Tele or Face-to-Face Assessment?: Tele Assessment Is this an Initial Assessment or a Re-assessment for this encounter?: Initial Assessment Marital status: Single Maiden name:  (n/a) Is patient pregnant?: No Pregnancy Status: No Living Arrangements: Alone Can pt return to current living arrangement?: Yes Admission Status: Voluntary Is patient capable of signing voluntary admission?: Yes Referral Source: Self/Family/Friend Insurance type:  (Medicaid )  Medical Screening Exam ([place]) Medical Exam completed: Yes  Crisis Care Plan Living Arrangements: Alone Legal Guardian: Other: Name of Psychiatrist: Unknown Name of Therapist: Unknown  Education Status Is patient currently in school?: No Current Grade:  (n/a) Highest grade of school patient has completed: NA Name of school: NA Contact person: NA  Risk to self with the past 6 months Suicidal Ideation: No Has patient been a risk to self within  the past 6 months prior to admission? : No Suicidal Intent: No Has patient had any suicidal intent within the past 6 months prior to admission? : No Is patient at risk for suicide?: No Suicidal Plan?: No Has patient had any suicidal plan within the past 6 months prior to admission? : No Specify  Current Suicidal Plan:  (deniees ) Access to Means: Yes Specify Access to Suicidal Means:  (access to medications ) What has been your use of drugs/alcohol within the last 12 months?:  (Patient denies ) Previous Attempts/Gestures: Yes How many times?:  (Yes ) Other Self Harm Risks:  (None reported) Triggers for Past Attempts: Hallucinations Intentional Self Injurious Behavior: None Family Suicide History: Unknown Recent stressful life event(s): Other (Comment) ("People are threatening to harm me") Persecutory voices/beliefs?: Yes Depression: Yes Depression Symptoms: Despondent Substance abuse history and/or treatment for substance abuse?: No Suicide prevention information given to non-admitted patients: Not applicable  Risk to Others within the past 6 months Homicidal Ideation: No Does patient have any lifetime risk of violence toward others beyond the six months prior to admission? : No Thoughts of Harm to Others: No Current Homicidal Intent: No Current Homicidal Plan: No Access to Homicidal Means: No Identified Victim:  (n/a) History of harm to others?: No Assessment of Violence: None Noted Violent Behavior Description:  (Patient is calm and cooperative ) Does patient have access to weapons?: No Criminal Charges Pending?: No Does patient have a court date: No Is patient on probation?: No  Psychosis Hallucinations: Auditory Delusions: None noted  Mental Status Report Appearance/Hygiene: Unremarkable Eye Contact: Fair Motor Activity: Freedom of movement Speech: Logical/coherent Level of Consciousness: Alert Mood: Depressed Affect: Depressed Anxiety Level: None Thought Processes: Thought Blocking Judgement: Impaired Orientation: Person, Place, Time, Situation, Appropriate for developmental age Obsessive Compulsive Thoughts/Behaviors: None  Cognitive Functioning Concentration: Decreased Memory: Recent Intact, Remote Intact IQ: Average Insight: Poor Impulse  Control: Fair Appetite: Fair Weight Loss:  (0) Weight Gain:  (0) Sleep: Decreased Total Hours of Sleep:  (0) Vegetative Symptoms: None  ADLScreening [facility] Assessment Services) Patient's cognitive ability adequate to safely complete daily activities?: Yes Patient able to express need for assistance with ADLs?: Yes Independently performs ADLs?: Yes (appropriate for developmental age)  Prior Inpatient Therapy Prior Inpatient Therapy: Yes Prior Therapy Dates: 10/2015, multiple admits Prior Therapy Facilty/Provider(s): [place] Reason for Treatment: Schizoaffective Disorder  Prior Outpatient Therapy Prior Outpatient Therapy: Yes Prior Therapy Dates: 2017 Prior Therapy Facilty/Provider(s): [facility] Reason for Treatment: Schizoaffective Disorder Does patient have an ACCT team?: No Does patient have Intensive In-House Services?  : No Does patient have [facility] services? : No Does patient have P4CC services?: No  ADL Screening (condition at time of admission) Patient's cognitive ability adequate to safely complete daily activities?: Yes Is the patient deaf or have difficulty hearing?: No Does the patient have difficulty seeing, even when wearing glasses/contacts?: No Does the patient have difficulty concentrating, remembering, or making decisions?: No Patient able to express need for assistance with ADLs?: Yes Does the patient have difficulty dressing or bathing?: No Independently performs ADLs?: Yes (appropriate for developmental age) Does the patient have difficulty walking or climbing stairs?: No Weakness of Legs: None Weakness of Arms/Hands: None  Home Assistive Devices/Equipment Home Assistive Devices/Equipment: None    Abuse/Neglect Assessment (Assessment to be complete while patient is alone) Physical Abuse: Denies Verbal Abuse: Denies Sexual Abuse: Denies Exploitation of patient/patient's resources: Denies Self-Neglect: Denies Values / Beliefs Cultural  Requests During Hospitalization: None  Spiritual Requests During Hospitalization: None   Advance Directives (For Healthcare) Does patient have an advance directive?: No Would patient like information on creating an advanced directive?: No - patient declined information Nutrition Screen- [facility] Adult/[facility] Patient's home diet: Regular  Additional Information 1:1 In Past 12 Months?: No CIRT Risk: No Elopement Risk: No Does patient have medical clearance?: Yes     Disposition:  Remain in the ED overnight per Waylan Boga, DNP; restart medications. Pending am psychiatric evaluation.   On Site Evaluation by:   Reviewed with Physician:    Waldon Merl [facility] 12/23/2015 6:53 PM

## 2015-12-23 NOTE — ED Notes (Signed)
Bed: [facility] Expected date:  Expected time:  Means of arrival:  Comments: 45 yo psych eval

## 2015-12-23 NOTE — ED Triage Notes (Addendum)
Pt BIB [facility] after stating the she needed 'psychiatric help.' Pt denies SI/HI. Just states that she needs help and will not speak specifically about what 'issues' she's having. Alert.

## 2015-12-24 DIAGNOSIS — F250 Schizoaffective disorder, bipolar type: Secondary | ICD-10-CM | POA: Diagnosis not present

## 2015-12-24 MED ORDER — POTASSIUM CHLORIDE CRYS ER 20 MEQ PO TBCR
40.0000 meq | EXTENDED_RELEASE_TABLET | Freq: Once | ORAL | Status: AC
  Administered 2015-12-24: 40 meq via ORAL
  Filled 2015-12-24: qty 2

## 2015-12-24 NOTE — BH Assessment (Addendum)
[place] Assessment Progress Note  Per Corena Pilgrim, MD, this pt does not require psychiatric hospitalization at this time.  Pt is to be discharged from [facility] with recommendation to continue treatment with [facility], her ACT Team service provider.  This has been included in pt's discharge instructions.  Pt's nurse, Nena Jordan, has been notified.  Jalene Mullet, MA Triage Specialist [phone]    Addendum:  At 11:24 this writer called [facility] and spoke to [facility] to inform her of disposition.   She reports that pt in on their schedule and she will have he clinician come to the ED to pick her up.  Jalene Mullet, [place] Triage Specialist [phone]

## 2015-12-24 NOTE — ED Notes (Signed)
Pt discharged ambulatory to the care of her ACT team.  All belongings were returned to patient.

## 2015-12-24 NOTE — Consult Note (Signed)
[facility] Psychiatry Consult   Reason for Consult:  Psychiatric Evaluation Referring Physician:  EDP Patient Identification: Mikita Lesmeister MRN:  [id number] Principal Diagnosis: Schizoaffective disorder, bipolar type [facility]) Diagnosis:   Patient Active Problem List   Diagnosis Date Noted  . No diagnosis on Axis I [Z03.89] 08/21/2015  . Abnormal behavior [R68.89]   . Involuntary commitment [Z04.6]   . Schizophrenia ([place]) [F20.9]   . Disturbance in affect ([place]) [F39]   . Tobacco use disorder [F17.200] 05/12/2015  . Cannabis use disorder, moderate, dependence ([place]) [F12.20] 05/12/2015  . Hypertension [I10] 02/24/2011  . ANEMIA-IRON DEFICIENCY [D50.9] 12/13/2006  . GERD [K21.9] 12/13/2006  . OBESITY [E66.9] 12/12/2006  . Schizoaffective disorder, bipolar type ([place]) [F25.0] 12/12/2006  . HEMORRHOIDS, NOS [K64.9] 12/12/2006  . PAIN-NECK [M54.2] 12/12/2006  . TREMOR [R25.9] 12/12/2006    Total Time spent with patient: 30 minutes  Subjective:   Hailei Besser is a 45 y.o. female patient who states "I was having some problems."  HPI:  Holle Rivers Gassmann is a 45 yo [place] American female who presented to [facility] ED for evaluation after calling EMS stating she needed "psychiatric help." She is seen face-to-face today with Dr. [name]. The patient states she was having "family problems" but does not elaborate on what these problems are. She has a psychiatric history which includes Bipolar I Disorder, Schizoaffective Disorder, and Schizophrenia. She is not compliant with her medication regimen, stating "I don't need no meds." She was calm overnight and slept throughout the night in the [facility].   Today, she denies suicidal or homicidal ideation, intent or plan. She denies AVH. Her UDS is negative; her BAL < 5.  She has an ACT team through [facility].    Past Psychiatric History: Schizophrenia  Risk to Self: Suicidal Ideation: No Suicidal Intent: No Is patient at risk for suicide?:  No Suicidal Plan?: No Specify Current Suicidal Plan:  (deniees ) Access to Means: Yes Specify Access to Suicidal Means:  (access to medications ) What has been your use of drugs/alcohol within the last 12 months?:  (Patient denies ) How many times?:  (Yes ) Other Self Harm Risks:  (None reported) Triggers for Past Attempts: Hallucinations Intentional Self Injurious Behavior: None Risk to Others: Homicidal Ideation: No Thoughts of Harm to Others: No Current Homicidal Intent: No Current Homicidal Plan: No Access to Homicidal Means: No Identified Victim:  (n/a) History of harm to others?: No Assessment of Violence: None Noted Violent Behavior Description:  (Patient is calm and cooperative ) Does patient have access to weapons?: No Criminal Charges Pending?: No Does patient have a court date: No Prior Inpatient Therapy: Prior Inpatient Therapy: Yes Prior Therapy Dates: 10/2015, multiple admits Prior Therapy Facilty/Provider(s): [place], [facility] Reason for Treatment: Schizoaffective Disorder Prior Outpatient Therapy: Prior Outpatient Therapy: Yes Prior Therapy Dates: 2017 Prior Therapy Facilty/Provider(s): [facility] Reason for Treatment: Schizoaffective Disorder Does patient have an ACCT team?: No Does patient have Intensive In-House Services?  : No Does patient have [facility] services? : No Does patient have P4CC services?: No  Past Medical History:  Past Medical History:  Diagnosis Date  . Arthritis   . Bipolar 1 disorder ([place])   . Bronchitis   . Depression   . Hemorrhoid   . Schizoaffective disorder ([place])   . Schizophrenia ([place])    Pt denies and reports it is schizoaffective disorder.   . Transfusion history     Past Surgical History:  Procedure Laterality Date  .  CESAREAN SECTION     Family History:  Family History  Problem Relation Age of Onset  . Diabetes type II Other    Family Psychiatric  History: unknown Social History:  History  Alcohol Use No      History  Drug Use No    Comment: Former user    Social History   Social History  . Marital status: Legally Separated    Spouse name: N/A  . Number of children: N/A  . Years of education: N/A   Social History Main Topics  . Smoking status: Current Every Day Smoker    Packs/day: 0.50    Years: 17.00    Types: Cigarettes  . Smokeless tobacco: Never Used  . Alcohol use No  . Drug use: No     Comment: Former user  . Sexual activity: Not on file   Other Topics Concern  . Not on file   Social History Narrative   ** Merged History Encounter **       Additional Social History:    Allergies:   Allergies  Allergen Reactions  . Anette Guarneri [Lurasidone Hcl] Nausea And Vomiting and Other (See Comments)    Reaction:  Shaking   . Flagyl [Metronidazole Hcl] Nausea And Vomiting  . Sulfa Antibiotics Nausea And Vomiting    Labs:  Results for orders placed or performed during the hospital encounter of 12/23/15 (from the past 48 hour(s))  Comprehensive metabolic panel     Status: Abnormal   Collection Time: 12/23/15  6:20 PM  Result Value Ref Range   Sodium 139 135 - 145 mmol/L   Potassium 3.1 (L) 3.5 - 5.1 mmol/L   Chloride 107 101 - 111 mmol/L   CO2 26 22 - 32 mmol/L   Glucose, Bld 95 65 - 99 mg/dL   BUN 9 6 - 20 mg/dL   Creatinine, Ser 0.89 0.44 - 1.00 mg/dL   Calcium 9.0 8.9 - 10.3 mg/dL   Total Protein 8.1 6.5 - 8.1 g/dL   Albumin 4.1 3.5 - 5.0 g/dL   AST 14 (L) 15 - 41 U/L   ALT 7 (L) 14 - 54 U/L   Alkaline Phosphatase 65 38 - 126 U/L   Total Bilirubin 0.5 0.3 - 1.2 mg/dL   GFR calc non Af Amer >60 >60 mL/min   GFR calc Af Amer >60 >60 mL/min    Comment: (NOTE) The eGFR has been calculated using the CKD EPI equation. This calculation has not been validated in all clinical situations. eGFR's persistently <60 mL/min signify possible Chronic Kidney Disease.    Anion gap 6 5 - 15  Ethanol     Status: None   Collection Time: 12/23/15  6:20 PM  Result Value Ref Range    Alcohol, Ethyl (B) <5 <5 mg/dL    Comment:        LOWEST DETECTABLE LIMIT FOR SERUM ALCOHOL IS 5 mg/dL FOR MEDICAL PURPOSES ONLY   Acetaminophen level     Status: Abnormal   Collection Time: 12/23/15  6:20 PM  Result Value Ref Range   Acetaminophen (Tylenol), Serum <10 (L) 10 - 30 ug/mL    Comment:        THERAPEUTIC CONCENTRATIONS VARY SIGNIFICANTLY. A RANGE OF 10-30 ug/mL MAY BE AN EFFECTIVE CONCENTRATION FOR MANY PATIENTS. HOWEVER, SOME ARE BEST TREATED AT CONCENTRATIONS OUTSIDE THIS RANGE. ACETAMINOPHEN CONCENTRATIONS >150 ug/mL AT 4 HOURS AFTER INGESTION AND >50 ug/mL AT 12 HOURS AFTER INGESTION ARE OFTEN ASSOCIATED WITH TOXIC REACTIONS.  Salicylate level     Status: None   Collection Time: 12/23/15  6:20 PM  Result Value Ref Range   Salicylate Lvl <3.0 2.8 - 30.0 mg/dL  CBC with Differential     Status: Abnormal   Collection Time: 12/23/15  6:20 PM  Result Value Ref Range   WBC 8.7 4.0 - 10.5 K/uL   RBC 3.39 (L) 3.87 - 5.11 MIL/uL   Hemoglobin 7.9 (L) 12.0 - 15.0 g/dL   HCT 24.8 (L) 36.0 - 46.0 %   MCV 73.2 (L) 78.0 - 100.0 fL   MCH 23.3 (L) 26.0 - 34.0 pg   MCHC 31.9 30.0 - 36.0 g/dL   RDW 18.5 (H) 11.5 - 15.5 %   Platelets 341 150 - 400 K/uL   Neutrophils Relative % 54 %   Neutro Abs 4.7 1.7 - 7.7 K/uL   Lymphocytes Relative 40 %   Lymphs Abs 3.4 0.7 - 4.0 K/uL   Monocytes Relative 6 %   Monocytes Absolute 0.5 0.1 - 1.0 K/uL   Eosinophils Relative 1 %   Eosinophils Absolute 0.1 0.0 - 0.7 K/uL   Basophils Relative 1 %   Basophils Absolute 0.1 0.0 - 0.1 K/uL  Pregnancy, urine     Status: None   Collection Time: 12/23/15  7:34 PM  Result Value Ref Range   Preg Test, Ur NEGATIVE NEGATIVE    Comment:        THE SENSITIVITY OF THIS METHODOLOGY IS >20 mIU/mL.   Urinalysis, Routine w reflex microscopic     Status: Abnormal   Collection Time: 12/23/15  7:34 PM  Result Value Ref Range   Color, Urine YELLOW YELLOW   APPearance CLOUDY (A) CLEAR   Specific  Gravity, Urine 1.027 1.005 - 1.030   pH 6.0 5.0 - 8.0   Glucose, UA NEGATIVE NEGATIVE mg/dL   Hgb urine dipstick NEGATIVE NEGATIVE   Bilirubin Urine NEGATIVE NEGATIVE   Ketones, ur NEGATIVE NEGATIVE mg/dL   Protein, ur NEGATIVE NEGATIVE mg/dL   Nitrite NEGATIVE NEGATIVE   Leukocytes, UA NEGATIVE NEGATIVE    Comment: MICROSCOPIC NOT DONE ON URINES WITH NEGATIVE PROTEIN, BLOOD, LEUKOCYTES, NITRITE, OR GLUCOSE <1000 mg/dL.  Urine rapid drug screen (hosp performed)     Status: None   Collection Time: 12/23/15  7:43 PM  Result Value Ref Range   Opiates NONE DETECTED NONE DETECTED   Cocaine NONE DETECTED NONE DETECTED   Benzodiazepines NONE DETECTED NONE DETECTED   Amphetamines NONE DETECTED NONE DETECTED   Tetrahydrocannabinol NONE DETECTED NONE DETECTED   Barbiturates NONE DETECTED NONE DETECTED    Comment:        DRUG SCREEN FOR MEDICAL PURPOSES ONLY.  IF CONFIRMATION IS NEEDED FOR ANY PURPOSE, NOTIFY LAB WITHIN 5 DAYS.        LOWEST DETECTABLE LIMITS FOR URINE DRUG SCREEN Drug Class       Cutoff (ng/mL) Amphetamine      1000 Barbiturate      200 Benzodiazepine   051 Tricyclics       102 Opiates          300 Cocaine          300 THC              50     Current Facility-Administered Medications  Medication Dose Route Frequency Provider Last Rate Last Dose  . acetaminophen (TYLENOL) tablet 650 mg  650 mg Oral Q4H PRN Davonna Belling, MD      . benztropine (COGENTIN)  tablet 1 mg  1 mg Oral BID Davonna Belling, MD      . haloperidol (HALDOL) tablet 5 mg  5 mg Oral BID Davonna Belling, MD      . ibuprofen (ADVIL,MOTRIN) tablet 600 mg  600 mg Oral Q8H PRN Davonna Belling, MD      . LORazepam (ATIVAN) tablet 1 mg  1 mg Oral Q8H PRN Davonna Belling, MD      . ondansetron [facility]) tablet 4 mg  4 mg Oral Q8H PRN Davonna Belling, MD      . potassium chloride SA (K-DUR,KLOR-CON) CR tablet 40 mEq  40 mEq Oral Once Lurena Nida, NP       Current Outpatient Prescriptions   Medication Sig Dispense Refill  . benztropine (COGENTIN) 1 MG tablet Take 1 tablet (1 mg total) by mouth 2 (two) times daily. (Patient not taking: Reported on 11/09/2015) 60 tablet 0  . haloperidol (HALDOL) 5 MG tablet Take 1 tablet (5 mg total) by mouth 2 (two) times daily. (Patient not taking: Reported on 11/09/2015) 60 tablet 0    Musculoskeletal: Strength & Muscle Tone: within normal limits Gait & Station: normal Patient leans: N/A  Psychiatric Specialty Exam: Physical Exam  Constitutional: She appears well-developed and well-nourished.  HENT:  Head: Normocephalic.  Neck: Normal range of motion.  Respiratory: Effort normal.  Musculoskeletal: Normal range of motion.  Neurological: She is alert.  Skin: Skin is warm and dry.  Psychiatric: She expresses no homicidal and no suicidal ideation. She expresses no suicidal plans and no homicidal plans.  See psychiatric specialty exam    Review of Systems  All other systems reviewed and are negative.   Blood pressure 152/76, pulse 77, temperature 99 F (37.2 C), temperature source Oral, resp. rate 15, last menstrual period 12/03/2015, SpO2 100 %.There is no height or weight on file to calculate BMI.  General Appearance: Fairly Groomed  Eye Contact:  Fair  Speech:  Normal rate, clear  Volume:  Normal  Mood:  Irritable  Affect:  Congruent  Thought Process:  Coherent  Orientation:  Full (Time, Place, and Person)  Thought Content:  Logical  Suicidal Thoughts:  No  Homicidal Thoughts:  No  Memory:  Immediate;   Fair Recent;   Fair Remote;   Fair  Judgement:  Poor  Insight:  Lacking  Psychomotor Activity:  Decreased  Concentration:  Concentration: Fair and Attention Span: Fair  Recall:  [employer] of Knowledge:  Fair  Language:  Fair  Akathisia:  No  Handed:  Right  AIMS (if indicated):     Assets:  Housing Resilience Social Support  ADL's:  Intact  Cognition:  WNL  Sleep:        Treatment Plan Summary: Case discussed  with Dr. [name]; recommendations are Plan :Discharge home with ACT Team  Disposition: No evidence of imminent risk to self or others at present.   Patient does not meet criteria for psychiatric inpatient admission. Supportive therapy provided about ongoing stressors.  Serena Colonel, FNP-BC [place] 12/24/2015 11:21 AM  Patient seen face-to-face for psychiatric evaluation, chart reviewed and case discussed with the physician extender and developed treatment plan. Reviewed the information documented and agree with the treatment plan. Corena Pilgrim, MD

## 2015-12-24 NOTE — Discharge Instructions (Addendum)
Schizoaffective Disorder Schizoaffective disorder (ScAD) is a mental illness. It causes symptoms that are a mixture of schizophrenia (a psychotic disorder) and an affective (mood) disorder. The schizophrenic symptoms may include delusions, hallucinations, or odd behavior. The mood symptoms may be similar to major depression or bipolar disorder. ScAD may interfere with personal relationships or normal daily activities. People with ScAD are at increased risk for job loss, social isolation,physical health problems, anxiety and substance use disorders, and suicide. ScAD usually occurs in cycles. Periods of severe symptoms are followed by periods of less severe symptoms or improvement. The illness affects men and women equally but usually appears at an earlier age (teenage or early adult years) in men. People who have family members with schizophrenia, bipolar disorder, or ScAD are at higher risk of developing ScAD. SYMPTOMS  At any one time, people with ScAD may have psychotic symptoms only or both psychotic and mood symptoms. The psychotic symptoms include one or more of the following:  Hearing, seeing, or feeling things that are not there (hallucinations).   Having fixed, false beliefs (delusions). The delusions usually are of being attacked, harassed, cheated, persecuted, or conspired against (paranoid delusions).  Speaking in a way that makes no sense to others (disorganized speech). The psychotic symptoms of ScAD may also include confusing or odd behavior or any of the negative symptoms of schizophrenia. These include loss of motivation for normal daily activities, such as bathing or grooming, withdrawal from other people, and lack of emotions.  The mood symptoms of ScAD occur more often than not. They resemble major depressive disorder or bipolar mania. Symptoms of major depression include depressed mood and four or more of the following:  Loss of interest in usually pleasurable activities  (anhedonia).  Sleeping more or less than normal.  Feeling worthless or excessively guilty.  Lack of energy or motivation.  Trouble concentrating.  Eating more or less than usual.  Thinking a lot about death or suicide. Symptoms of bipolar mania include abnormally elevated or irritable mood and increased energy or activity, plus three or more of the following:   More confidence than normal or feeling that you are able to do anything (grandiosity).  Feeling rested with less sleep than normal.   Being easily distracted.   Talking more than usual or feeling pressured to keep talking.   Feeling that your thoughts are racing.  Engaging in high-risk activities such as buying sprees or foolish business decisions. DIAGNOSIS  ScAD is diagnosed through an assessment by your health care provider. Your health care provider will observe and ask questions about your thoughts, behavior, mood, and ability to function in daily life. Your health care provider may also ask questions about your medical history and use of drugs, including prescription medicines. Your health care provider may also order blood tests and imaging exams. Certain medical conditions and substances can cause symptoms that resemble ScAD. Your health care provider may refer you to a mental health specialist for evaluation.  ScAD is divided into two types. The depressive type is diagnosed if your mood symptoms are limited to major depression. The bipolar type is diagnosed if your mood symptoms are manic or a mixture of manic and depressive symptoms TREATMENT  ScAD is usually a lifelong illness. Long-term treatment is necessary. The following treatments are available:  Medicine. Different types of medicine are used to treat ScAD. The exact combination depends on the type and severity of your symptoms. Antipsychotic medicine is used to control psychotic symptomssuch as delusions, paranoia,  and hallucinations. Mood stabilizers can  even the highs and lows of bipolar manic mood swings. Antidepressant medicines are used to treat major depressive symptoms.  Counseling or talk therapy. Individual, group, or family counseling may be helpful in providing education, support, and guidance. Many people with ScAD also benefit from social skills and job skills (vocational) training. A combination of medicine and counseling is usually best for managing the disorder over time. A procedure in which electricity is applied to the brain through the scalp (electroconvulsive therapy) may be used to treat people with severe manic symptoms that do not respond to medicine and counseling. HOME CARE INSTRUCTIONS   Take all your medicine as prescribed.  Check with your health care provider before starting new prescription or over-the-counter medicines.  Keep all follow up appointments with your health care provider. 1.Take all your medications as prescribed.  2. Report any adverse side effects to your medication to your outpatient provider. 3. Do not use alcohol or illegal drugs while taking prescription medications.  4. In the event of worsening symptoms, call 911, the crisis hotline or go to nearest emergency room for evaluation of symptoms. SEEK MEDICAL CARE IF:   If you are not able to take your medicines as prescribed.  If your symptoms get worse. SEEK IMMEDIATE MEDICAL CARE IF:   You have serious thoughts about hurting yourself or others.   This information is not intended to replace advice given to you by your health care provider. Make sure you discuss any questions you have with your health care provider.   Document Released: 08/16/2006 Document Revised: 04/26/2014 Document Reviewed: 11/17/2012 Elsevier Interactive Patient Education [employer]. For your ongoing behavioral health needs, you are advised to continue treatment with [facility], your ACT Team provider:       [facility]      [facility], [address]      [address]      [place], [place]  [address]      [phone]

## 2015-12-24 NOTE — BHH Suicide Risk Assessment (Signed)
Suicide Risk Assessment  Discharge Assessment   [facility] Discharge Suicide Risk Assessment   Principal Problem: Schizoaffective disorder, bipolar type [facility]) Discharge Diagnoses:  Patient Active Problem List   Diagnosis Date Noted  . No diagnosis on Axis I [Z03.89] 08/21/2015  . Abnormal behavior [R68.89]   . Involuntary commitment [Z04.6]   . Schizophrenia ([place]) [F20.9]   . Disturbance in affect ([place]) [F39]   . Tobacco use disorder [F17.200] 05/12/2015  . Cannabis use disorder, moderate, dependence ([place]) [F12.20] 05/12/2015  . Hypertension [I10] 02/24/2011  . ANEMIA-IRON DEFICIENCY [D50.9] 12/13/2006  . GERD [K21.9] 12/13/2006  . OBESITY [E66.9] 12/12/2006  . Schizoaffective disorder, bipolar type ([place]) [F25.0] 12/12/2006  . HEMORRHOIDS, NOS [K64.9] 12/12/2006  . PAIN-NECK [M54.2] 12/12/2006  . TREMOR [R25.9] 12/12/2006    Total Time spent with patient: 15 minutes  Musculoskeletal: Strength & Muscle Tone: within normal limits Gait & Station: normal Patient leans: N/A  Psychiatric Specialty Exam:   Blood pressure (!) 118/53, pulse 64, temperature 99 F (37.2 C), temperature source Oral, resp. rate 16, last menstrual period 12/03/2015, SpO2 99 %.There is no height or weight on file to calculate BMI.   General Appearance: Fairly Groomed  Eye Contact:  Fair  Speech:  Normal rate, clear  Volume:  Normal  Mood:  Irritable  Affect:  Congruent  Thought Process:  Coherent  Orientation:  Full (Time, Place, and Person)  Thought Content:  Logical  Suicidal Thoughts:  No  Homicidal Thoughts:  No  Memory:  Immediate;   Fair Recent;   Fair Remote;   Fair  Judgement:  Poor  Insight:  Lacking  Psychomotor Activity:  Decreased  Concentration:  Concentration: Fair and Attention Span: Fair  Recall:  [employer] of Knowledge:  Fair  Language:  Fair  Akathisia:  No  Handed:  Right  AIMS (if indicated):     Assets:  Housing Resilience Social Support  ADL's:  Intact  Cognition:   WNL  Sleep:      Mental Status Per Nursing Assessment::   On Admission:     Demographic Factors:  Low socioeconomic status and Unemployed  Loss Factors: NA  Historical Factors: Family history of mental illness or substance abuse  Risk Reduction Factors:   Living with another person, especially a relative, Positive therapeutic relationship and Has ACT Team  Continued Clinical Symptoms:  Schizophrenia   Cognitive Features That Contribute To Risk:  Closed-mindedness    Suicide Risk:  Mild:  Suicidal ideation of limited frequency, intensity, duration, and specificity.  There are no identifiable plans, no associated intent, mild dysphoria and related symptoms, good self-control (both objective and subjective assessment), few other risk factors, and identifiable protective factors, including available and accessible social support.    Plan Of Care/Follow-up recommendations:  Activity:  As tolerated Diet:  Regular Tests:  As determined by PCP Other:  Keep any follow-up appointments with PCP 1.Take all your medications as prescribed.  2. Report any adverse side effects to your medication to your outpatient provider. 3. Do not use alcohol or illegal drugs while taking prescription medications. 4. In the event of worsening symptoms, call 911, the crisis hotline or go to nearest emergency room for evaluation of symptoms.  Serena Colonel, FNP-BC [place] 12/24/2015, 4:17 PM

## 2015-12-25 ENCOUNTER — Emergency Department (HOSPITAL_COMMUNITY)
Admission: EM | Admit: 2015-12-25 | Discharge: 2015-12-26 | Disposition: A | Discharge status: Home / Self Care | Payer: Medicaid Other | Attending: Emergency Medicine | Admitting: Emergency Medicine

## 2015-12-25 ENCOUNTER — Encounter (HOSPITAL_COMMUNITY): Payer: Self-pay | Admitting: Emergency Medicine

## 2015-12-25 DIAGNOSIS — F17210 Nicotine dependence, cigarettes, uncomplicated: Secondary | ICD-10-CM | POA: Insufficient documentation

## 2015-12-25 DIAGNOSIS — R45851 Suicidal ideations: Secondary | ICD-10-CM | POA: Diagnosis present

## 2015-12-25 DIAGNOSIS — F250 Schizoaffective disorder, bipolar type: Secondary | ICD-10-CM | POA: Diagnosis not present

## 2015-12-25 LAB — CBC WITH DIFFERENTIAL/PLATELET
Basophils Absolute: 0.1 K/uL (ref 0.0–0.1)
Basophils Relative: 1 %
Eosinophils Absolute: 0.1 K/uL (ref 0.0–0.7)
Eosinophils Relative: 1 %
HCT: 25 % — ABNORMAL LOW (ref 36.0–46.0)
Hemoglobin: 7.9 g/dL — ABNORMAL LOW (ref 12.0–15.0)
Lymphocytes Relative: 44 %
Lymphs Abs: 4.6 K/uL — ABNORMAL HIGH (ref 0.7–4.0)
MCH: 23.7 pg — ABNORMAL LOW (ref 26.0–34.0)
MCHC: 31.6 g/dL (ref 30.0–36.0)
MCV: 74.9 fL — ABNORMAL LOW (ref 78.0–100.0)
Monocytes Absolute: 0.5 K/uL (ref 0.1–1.0)
Monocytes Relative: 5 %
Neutro Abs: 5.1 K/uL (ref 1.7–7.7)
Neutrophils Relative %: 49 %
Platelets: 311 K/uL (ref 150–400)
RBC: 3.34 MIL/uL — ABNORMAL LOW (ref 3.87–5.11)
RDW: 18.9 % — ABNORMAL HIGH (ref 11.5–15.5)
WBC: 10.3 K/uL (ref 4.0–10.5)

## 2015-12-25 LAB — BASIC METABOLIC PANEL WITH GFR
Anion gap: 5 (ref 5–15)
BUN: 11 mg/dL (ref 6–20)
CO2: 26 mmol/L (ref 22–32)
Calcium: 9.3 mg/dL (ref 8.9–10.3)
Chloride: 108 mmol/L (ref 101–111)
Creatinine, Ser: 0.84 mg/dL (ref 0.44–1.00)
GFR calc Af Amer: >60 mL/min
GFR calc non Af Amer: >60 mL/min
Glucose, Bld: 89 mg/dL (ref 65–99)
Potassium: 3.6 mmol/L (ref 3.5–5.1)
Sodium: 139 mmol/L (ref 135–145)

## 2015-12-25 LAB — SALICYLATE LEVEL: Salicylate Lvl: <4 mg/dL (ref 2.8–30.0)

## 2015-12-25 LAB — ACETAMINOPHEN LEVEL: Acetaminophen (Tylenol), Serum: <10 ug/mL — ABNORMAL LOW (ref 10–30)

## 2015-12-25 LAB — ETHANOL: Alcohol, Ethyl (B): <5 mg/dL

## 2015-12-25 MED ORDER — IBUPROFEN 200 MG PO TABS: 600.0000 mg | ORAL_TABLET | Freq: Three times a day (TID) | ORAL | Status: DC | PRN

## 2015-12-25 NOTE — ED Notes (Signed)
Bed: [facility] Expected date:  Expected time:  Means of arrival:  Comments: Pt in room  

## 2015-12-25 NOTE — ED Notes (Signed)
Patient refused to let me collect the labs

## 2015-12-25 NOTE — ED Provider Notes (Signed)
[facility] DEPT Provider Note   CSN: [id number] Arrival date & time: 12/25/15  1440     History   Chief Complaint Chief Complaint  Patient presents with  . Suicidal    HPI [name] is a 45 y.o. female with a past medical history of schizophrenia, bipolar 1, depression who presents to the ED today complaining of suicidal ideation. Patient states that this morning when she woke up she began having thoughts of harming herself without any overt plan. She states she was here yesterday with similar symptoms but they went away so she left. However, the suicidal ideations came back this morning. Denies any previous suicide attempt. She denies any HI, hallucinations. Patient states that she does not take any medications. She denies any drug or alcohol use.  HPI  Past Medical History:  Diagnosis Date  . Arthritis   . Bipolar 1 disorder ([place])   . Bronchitis   . Depression   . Hemorrhoid   . Schizoaffective disorder ([place])   . Schizophrenia ([place])    Pt denies and reports it is schizoaffective disorder.   . Transfusion history     Patient Active Problem List   Diagnosis Date Noted  . No diagnosis on Axis I 08/21/2015  . Abnormal behavior   . Involuntary commitment   . Schizophrenia ([place])   . Disturbance in affect ([place])   . Tobacco use disorder 05/12/2015  . Cannabis use disorder, moderate, dependence ([place]) 05/12/2015  . Hypertension 02/24/2011  . ANEMIA-IRON DEFICIENCY 12/13/2006  . GERD 12/13/2006  . OBESITY 12/12/2006  . Schizoaffective disorder, bipolar type ([place]) 12/12/2006  . HEMORRHOIDS, NOS 12/12/2006  . PAIN-NECK 12/12/2006  . TREMOR 12/12/2006    Past Surgical History:  Procedure Laterality Date  . CESAREAN SECTION      OB History    No data available       Home Medications    Prior to Admission medications   Medication Sig Start Date End Date Taking? Authorizing Provider  benztropine (COGENTIN) 1 MG tablet Take 1 tablet (1 mg total) by mouth  2 (two) times daily. Patient not taking: Reported on 11/09/2015 08/14/15   Delfin Gant, NP  haloperidol (HALDOL) 5 MG tablet Take 1 tablet (5 mg total) by mouth 2 (two) times daily. Patient not taking: Reported on 11/09/2015 08/14/15   Delfin Gant, NP    Family History Family History  Problem Relation Age of Onset  . Diabetes type II Other     Social History Social History  Substance Use Topics  . Smoking status: Current Every Day Smoker    Packs/day: 0.50    Years: 17.00    Types: Cigarettes  . Smokeless tobacco: Never Used  . Alcohol use No     Allergies   Latuda [lurasidone hcl]; Flagyl [metronidazole hcl]; and Sulfa antibiotics   Review of Systems Review of Systems  All other systems reviewed and are negative.    Physical Exam Updated Vital Signs BP 139/78   Pulse 63   Temp 98.2 F (36.8 C) (Oral)   Resp 16   LMP 12/03/2015 (Approximate)   SpO2 97%   Physical Exam  Constitutional: She is oriented to person, place, and time. She appears well-developed and well-nourished. No distress.  HENT:  Head: Normocephalic and atraumatic.  Eyes: Conjunctivae are normal. Right eye exhibits no discharge. Left eye exhibits no discharge. No scleral icterus.  Cardiovascular: Normal rate.   Pulmonary/Chest: Effort normal.  Neurological: She is alert and oriented to  person, place, and time. Coordination normal.  Skin: Skin is warm and dry. No rash noted. She is not diaphoretic. No erythema. No pallor.  Psychiatric:  SI  Nursing note and vitals reviewed.    ED Treatments / Results  Labs (all labs ordered are listed, but only abnormal results are displayed) Labs Reviewed  ACETAMINOPHEN LEVEL - Abnormal; Notable for the following:       Result Value   Acetaminophen (Tylenol), Serum <10 (*)    All other components within normal limits  CBC WITH DIFFERENTIAL/PLATELET - Abnormal; Notable for the following:    RBC 3.34 (*)    Hemoglobin 7.9 (*)    HCT 25.0 (*)     MCV 74.9 (*)    MCH 23.7 (*)    RDW 18.9 (*)    Lymphs Abs 4.6 (*)    All other components within normal limits  BASIC METABOLIC PANEL  ETHANOL  SALICYLATE LEVEL  URINE RAPID DRUG SCREEN, HOSP PERFORMED  PREGNANCY, URINE    EKG  EKG Interpretation None       Radiology Dg Chest Portable 1 View  Result Date: 12/23/2015 CLINICAL DATA:  Altered mental status EXAM: PORTABLE CHEST 1 VIEW COMPARISON:  05/21/2014 FINDINGS: Mild enlargement of the cardiopericardial silhouette, without edema, but increased from prior. Linear subsegmental atelectasis or scarring in the lingula. No pleural effusion. IMPRESSION: 1. Mild enlargement of the cardiopericardial silhouette, without edema. The cardiac prominence is increased from prior, even accounting for the difference in projection. 2. Lingular scarring or subsegmental atelectasis. Electronically Signed   By: Van Clines M.D.   On: 12/23/2015 19:10    Procedures Procedures (including critical care time)  Medications Ordered in ED Medications  ibuprofen (ADVIL,MOTRIN) tablet 600 mg (not administered)     Initial Impression / Assessment and Plan / ED Course  I have reviewed the triage vital signs and the nursing notes.  Pertinent labs & imaging results that were available during my care of the patient were reviewed by me and considered in my medical decision making (see chart for details).  Clinical Course    Pt presents to the ED c.o SI. Pt has long hx of multiple psych disorders. Denies HI, hallucinations, drug or alcohol use. Hgb low, this is baseline for pt. All other lab work unremarkable.. Pt medically cleared and awaiting TTS consult.  Final Clinical Impressions(s) / ED Diagnoses   Final diagnoses:  Suicidal ideation    New Prescriptions New Prescriptions   No medications on file     Carlos Levering, PA-C 12/26/15 0006    Sharlett Iles, MD 12/26/15 [phone]

## 2015-12-25 NOTE — BH Assessment (Signed)
Tele Assessment Note   [name] is an 44 y.o. female.  -Clinician reviewed note from [place], [place].  Patient had been discharged from [facility] yesterday (09/06).  She says that today she began having suicidal thoughts again today.  Denies a plan at this time.  Denying HI or A/V hallucinations.  Patient was in bed and did not wish to be disturbed for assessment.  She said that she did not feel well and asked clinician "can you come back tomorrow?"  Clinician told her no, that he needed a few minutes of her time.  Patient says she still wanted to kill herself but does not have a plan.  When asked about HI she does not answer but rather "grunts."  Patient did not answer concerning hallucinations.  Patient did say, "It seems like nobody cares."  Patient said "They don't do anything."  When asked about [facility] ACTT team.  Patient has been seen multiple times in the ED for similar complaint.  She has had multiple admissions with the last one being 11-04-15.  -Patient to be reviewed by psychiatry in AM on 09/08.  Diagnosis: Schizoaffective d/o bipolar type.  Past Medical History:  Past Medical History:  Diagnosis Date  . Arthritis   . Bipolar 1 disorder ([place])   . Bronchitis   . Depression   . Hemorrhoid   . Schizoaffective disorder ([place])   . Schizophrenia ([place])    Pt denies and reports it is schizoaffective disorder.   . Transfusion history     Past Surgical History:  Procedure Laterality Date  . CESAREAN SECTION      Family History:  Family History  Problem Relation Age of Onset  . Diabetes type II Other     Social History:  reports that she has been smoking Cigarettes.  She has a 8.50 pack-year smoking history. She has never used smokeless tobacco. She reports that she does not drink alcohol or use drugs.  Additional Social History:  Alcohol / Drug Use Pain Medications: See PTA medication list Prescriptions: See PTA medication list Over the Counter: See PTA medication  list History of alcohol / drug use?: No history of alcohol / drug abuse (Pt did not wish to talk.)  CIWA: CIWA-Ar BP: 122/98 Pulse Rate: 61 COWS:    PATIENT STRENGTHS: (choose at least two) Average or above average intelligence Capable of independent living Communication skills  Allergies:  Allergies  Allergen Reactions  . Anette Guarneri [Lurasidone Hcl] Nausea And Vomiting and Other (See Comments)    Reaction:  Shaking   . Flagyl [Metronidazole Hcl] Nausea And Vomiting  . Sulfa Antibiotics Nausea And Vomiting    Home Medications:  (Not in a hospital admission)  OB/GYN Status:  Patient's last menstrual period was 12/03/2015 (approximate).  General Assessment Data [facility]: [facility] TTS Assessment: In system Is this a Tele or Face-to-Face Assessment?: Face-to-Face Is this an Initial Assessment or a Re-assessment for this encounter?: Initial Assessment Marital status: Single Is patient pregnant?: No Pregnancy Status: No Living Arrangements: Alone Can pt return to current living arrangement?: Yes Admission Status: Voluntary Is patient capable of signing voluntary admission?: Yes Referral Source: Self/Family/Friend Insurance type: [place] Access MCD     Crisis Care Plan Living Arrangements: Alone Name of Psychiatrist: [facility] ACTT team Name of Therapist: [facility] Team  Education Status Is patient currently in school?: No  Risk to self with the past 6 months Suicidal Ideation: Yes-Currently Present Has patient been a risk to self within  the past 6 months prior to admission? : No Suicidal Intent: No Has patient had any suicidal intent within the past 6 months prior to admission? : No Is patient at risk for suicide?: Yes Suicidal Plan?: No Has patient had any suicidal plan within the past 6 months prior to admission? : No Specify Current Suicidal Plan: None voiced Access to Means: No Specify Access to Suicidal Means: Acces to medications What has been your use  of drugs/alcohol within the last 12 months?: Pt denies Previous Attempts/Gestures: Yes How many times?: 5 Other Self Harm Risks: No Triggers for Past Attempts: Hallucinations Intentional Self Injurious Behavior: None Family Suicide History: Unknown Recent stressful life event(s): Turmoil (Comment) (Recent conflict w/ female friend) Persecutory voices/beliefs?: Yes Depression: Yes Depression Symptoms: Despondent, Isolating, Loss of interest in usual pleasures, Feeling worthless/self pity, Feeling angry/irritable Substance abuse history and/or treatment for substance abuse?: No Suicide prevention information given to non-admitted patients: Not applicable  Risk to Others within the past 6 months Homicidal Ideation: No Does patient have any lifetime risk of violence toward others beyond the six months prior to admission? : No Thoughts of Harm to Others: No Current Homicidal Intent: No Current Homicidal Plan: No Access to Homicidal Means: No Identified Victim: No one History of harm to others?: No Assessment of Violence: None Noted Violent Behavior Description: Pt denies Does patient have access to weapons?: No Criminal Charges Pending?: No Does patient have a court date: No Is patient on probation?: No  Psychosis Hallucinations: Auditory (Pt won't answer if hearing voices) Delusions: None noted  Mental Status Report Appearance/Hygiene: Unremarkable, In scrubs Eye Contact: Poor Motor Activity: Freedom of movement, Unremarkable Speech: Logical/coherent, Soft Level of Consciousness: Drowsy Mood: Depressed, Despair, Sad Affect: Depressed, Sad Anxiety Level: None Thought Processes: Thought Blocking Judgement: Unimpaired Orientation: Person, Place, Time, Situation, Appropriate for developmental age Obsessive Compulsive Thoughts/Behaviors: None  Cognitive Functioning Concentration: Unable to Assess Memory: Unable to Assess IQ: Average Insight: Unable to Assess Impulse Control:  Fair Appetite: Fair Weight Loss: 0 Weight Gain: 0 Sleep: Unable to Assess Total Hours of Sleep:  (Unable to assess) Vegetative Symptoms: None, Unable to Assess  ADLScreening [facility] Assessment Services) Patient's cognitive ability adequate to safely complete daily activities?: Yes Patient able to express need for assistance with ADLs?: Yes Independently performs ADLs?: Yes (appropriate for developmental age)  Prior Inpatient Therapy Prior Inpatient Therapy: Yes Prior Therapy Dates: 10/2015, multiple admits Prior Therapy Facilty/Provider(s): [facility], [facility] Reason for Treatment: Schizoaffective Disorder  Prior Outpatient Therapy Prior Outpatient Therapy: Yes Prior Therapy Dates: 2017 Prior Therapy Facilty/Provider(s): [facility] Reason for Treatment: Schizoaffective Disorder Does patient have an ACCT team?: Yes ([facility] ([id number]) Does patient have Intensive In-House Services?  : No Does patient have [facility] services? : No Does patient have P4CC services?: No  ADL Screening (condition at time of admission) Patient's cognitive ability adequate to safely complete daily activities?: Yes Is the patient deaf or have difficulty hearing?: No Does the patient have difficulty seeing, even when wearing glasses/contacts?: No Does the patient have difficulty concentrating, remembering, or making decisions?: Yes Patient able to express need for assistance with ADLs?: Yes Does the patient have difficulty dressing or bathing?: No Independently performs ADLs?: Yes (appropriate for developmental age) Does the patient have difficulty walking or climbing stairs?: No Weakness of Legs: None Weakness of Arms/Hands: None       Abuse/Neglect Assessment (Assessment to be complete while patient is alone) Physical Abuse: Denies Verbal Abuse: Denies Sexual Abuse:  Denies Exploitation of patient/patient's resources: Denies Self-Neglect: Denies     Regulatory affairs officer (For Healthcare) Does  patient have an advance directive?: No Would patient like information on creating an advanced directive?: No - patient declined information    Additional Information 1:1 In Past 12 Months?: No CIRT Risk: No Elopement Risk: No Does patient have medical clearance?: Yes     Disposition:  Disposition Initial Assessment Completed for this Encounter: Yes Disposition of Patient: Other dispositions Type of inpatient treatment program: Adult Other disposition(s): Other (Comment) (Pt to be reviewed by psychiatry in AM)  Curlene Dolphin Ray 12/25/2015 10:13 PM

## 2015-12-25 NOTE — ED Notes (Signed)
TTS at bedside. 

## 2015-12-25 NOTE — ED Triage Notes (Signed)
Pt reports suicidal thoughts. Pt reports she was here yesterday for same, but did not want to talk about it at that time. Also reports thoughts of hurting people who come up against her. Would not clarify more than that.

## 2015-12-26 DIAGNOSIS — F250 Schizoaffective disorder, bipolar type: Secondary | ICD-10-CM

## 2015-12-26 MED ORDER — HALOPERIDOL DECANOATE 100 MG/ML IM SOLN
100.0000 mg | Freq: Once | INTRAMUSCULAR | Status: DC
  Filled 2015-12-26: qty 1

## 2015-12-26 MED ORDER — BENZTROPINE MESYLATE 1 MG PO TABS
1.0000 mg | ORAL_TABLET | Freq: Two times a day (BID) | ORAL | Status: DC
  Filled 2015-12-26: qty 1

## 2015-12-26 MED ORDER — DIPHENHYDRAMINE HCL 50 MG/ML IJ SOLN: 50.0000 mg | Freq: Once | INTRAMUSCULAR | Status: DC

## 2015-12-26 MED ORDER — HALOPERIDOL 5 MG PO TABS
5.0000 mg | ORAL_TABLET | Freq: Two times a day (BID) | ORAL | Status: DC
Start: 2015-12-26 — End: 2015-12-26
  Filled 2015-12-26: qty 1

## 2015-12-26 NOTE — ED Notes (Signed)
Patient noted in room. No complaints, stable, in no acute distress. Q15 minute rounds and monitoring via [employer] Cam[employer] to continue.  

## 2015-12-26 NOTE — Consult Note (Signed)
[name]   Reason for [name]:  Suicidal ideations Referring Physician:  EDP Patient Identification: [name] MRN:  [id number] Principal Diagnosis: Schizoaffective disorder, bipolar type [facility]) Diagnosis:   Patient Active Problem List   Diagnosis Date Noted  . Schizoaffective disorder, bipolar type ([place]) [F25.0] 12/12/2006    Priority: High  . Cannabis use disorder, moderate, dependence ([place]) [F12.20] 05/12/2015    Priority: Low  . No diagnosis on Axis I [Z03.89] 08/21/2015  . Abnormal behavior [R68.89]   . Involuntary commitment [Z04.6]   . Schizophrenia ([place]) [F20.9]   . Disturbance in affect ([place]) [F39]   . Tobacco use disorder [F17.200] 05/12/2015  . Hypertension [I10] 02/24/2011  . ANEMIA-IRON DEFICIENCY [D50.9] 12/13/2006  . GERD [K21.9] 12/13/2006  . OBESITY [E66.9] 12/12/2006  . HEMORRHOIDS, NOS [K64.9] 12/12/2006  . PAIN-NECK [M54.2] 12/12/2006  . TREMOR [R25.9] 12/12/2006    Total Time spent with patient: 45 minutes  Subjective:   [name] is a 45 y.o. female patient does not warrant admission.  HPI:  45 yo female who presented to the ED for suicidal ideations but none today.  She is upset that she has a payee and does not control her own money.  [name] is also upset that her television is not working.  She does not want any medications from [place] as Dr. Darleene [name] wanted to give her an injectable medication.  [name] is pleasantly eating peanut butter and crackers while television.  No signs of depression, just reports she needed some food.  No suicidal/homicidal ideations, hallucinations, and alcohol/drug abuse.  Stable for discharge, [facility] her ACT team was called and reported her food and tv issues.  She is well known to this facility for coming and watching television requesting multiple snacks.  Past Psychiatric History: schizoaffective disorder  Risk to Self: Suicidal Ideation: Yes-Currently Present Suicidal Intent: No Is  patient at risk for suicide?: Yes Suicidal Plan?: No Specify Current Suicidal Plan: None voiced Access to Means: No Specify Access to Suicidal Means: Acces to medications What has been your use of drugs/alcohol within the last 12 months?: Pt denies How many times?: 5 Other Self Harm Risks: No Triggers for Past Attempts: Hallucinations Intentional Self Injurious Behavior: None Risk to Others: Homicidal Ideation: No Thoughts of Harm to Others: No Current Homicidal Intent: No Current Homicidal Plan: No Access to Homicidal Means: No Identified Victim: No one History of harm to others?: No Assessment of Violence: None Noted Violent Behavior Description: Pt denies Does patient have access to weapons?: No Criminal Charges Pending?: No Does patient have a court date: No Prior Inpatient Therapy: Prior Inpatient Therapy: Yes Prior Therapy Dates: 10/2015, multiple admits Prior Therapy Facilty/Provider(s): [place], [facility] Reason for Treatment: Schizoaffective Disorder Prior Outpatient Therapy: Prior Outpatient Therapy: Yes Prior Therapy Dates: 2017 Prior Therapy Facilty/Provider(s): [facility] Reason for Treatment: Schizoaffective Disorder Does patient have an ACCT team?: Yes ([facility] [phone]) Does patient have Intensive In-House Services?  : No Does patient have [facility] services? : No Does patient have P4[facility] services?: No  Past Medical History:  Past Medical History:  Diagnosis Date  . Arthritis   . Bipolar 1 disorder ([place])   . Bronchitis   . Depression   . Hemorrhoid   . Schizoaffective disorder ([name])   . Schizophrenia ([place])    Pt denies and reports it is schizoaffective disorder.   . Transfusion history     Past Surgical History:  Procedure Laterality Date  . CESAREAN SECTION  Family History:  Family History  Problem Relation Age of Onset  . Diabetes type II Other    Family Psychiatric  History: none Social History:  History  Alcohol Use No      History  Drug Use No    Comment: Former user    Social History   Social History  . Marital status: Legally Separated    Spouse name: N/A  . Number of children: N/A  . Years of education: N/A   Social History Main Topics  . Smoking status: Current Every Day Smoker    Packs/day: 0.50    Years: 17.00    Types: Cigarettes  . Smokeless tobacco: Never Used  . Alcohol use No  . Drug use: No     Comment: Former user  . Sexual activity: Not Asked   Other Topics Concern  . None   Social History Narrative   ** Merged History Encounter **       Additional Social History:    Allergies:   Allergies  Allergen Reactions  . Anette Guarneri [Lurasidone Hcl] Nausea And Vomiting and Other (See Comments)    Reaction:  Shaking   . Flagyl [Metronidazole Hcl] Nausea And Vomiting  . Sulfa Antibiotics Nausea And Vomiting    Labs:  Results for orders placed or performed during the hospital encounter of 12/25/15 (from the past 48 hour(s))  Acetaminophen level     Status: Abnormal   Collection Time: 12/25/15  6:00 PM  Result Value Ref Range   Acetaminophen (Tylenol), Serum <10 (L) 10 - 30 ug/mL    Comment:        THERAPEUTIC CONCENTRATIONS VARY SIGNIFICANTLY. A RANGE OF 10-30 ug/mL MAY BE AN EFFECTIVE CONCENTRATION FOR MANY PATIENTS. HOWEVER, SOME ARE BEST TREATED AT CONCENTRATIONS OUTSIDE THIS RANGE. ACETAMINOPHEN CONCENTRATIONS >150 ug/mL AT 4 HOURS AFTER INGESTION AND >50 ug/mL AT 12 HOURS AFTER INGESTION ARE OFTEN ASSOCIATED WITH TOXIC REACTIONS.   Ethanol     Status: None   Collection Time: 12/25/15  6:00 PM  Result Value Ref Range   Alcohol, Ethyl (B) <5 <5 mg/dL    Comment:        LOWEST DETECTABLE LIMIT FOR SERUM ALCOHOL IS 5 mg/dL FOR MEDICAL PURPOSES ONLY   Salicylate level     Status: None   Collection Time: 12/25/15  6:00 PM  Result Value Ref Range   Salicylate Lvl <2.0 2.8 - 30.0 mg/dL  Basic metabolic panel     Status: None   Collection Time: 12/25/15  7:05 PM   Result Value Ref Range   Sodium 139 135 - 145 mmol/L   Potassium 3.6 3.5 - 5.1 mmol/L   Chloride 108 101 - 111 mmol/L   CO2 26 22 - 32 mmol/L   Glucose, Bld 89 65 - 99 mg/dL   BUN 11 6 - 20 mg/dL   Creatinine, Ser 0.84 0.44 - 1.00 mg/dL   Calcium 9.3 8.9 - 10.3 mg/dL   GFR calc non Af Amer >60 >60 mL/min   GFR calc Af Amer >60 >60 mL/min    Comment: (NOTE) The eGFR has been calculated using the CKD EPI equation. This calculation has not been validated in all clinical situations. eGFR's persistently <60 mL/min signify possible Chronic Kidney Disease.    Anion gap 5 5 - 15  CBC with Differential     Status: Abnormal   Collection Time: 12/25/15  7:05 PM  Result Value Ref Range   WBC 10.3 4.0 - 10.5 K/uL  RBC 3.34 (L) 3.87 - 5.11 MIL/uL   Hemoglobin 7.9 (L) 12.0 - 15.0 g/dL   HCT 25.0 (L) 36.0 - 46.0 %   MCV 74.9 (L) 78.0 - 100.0 fL   MCH 23.7 (L) 26.0 - 34.0 pg   MCHC 31.6 30.0 - 36.0 g/dL   RDW 18.9 (H) 11.5 - 15.5 %   Platelets 311 150 - 400 K/uL   Neutrophils Relative % 49 %   Neutro Abs 5.1 1.7 - 7.7 K/uL   Lymphocytes Relative 44 %   Lymphs Abs 4.6 (H) 0.7 - 4.0 K/uL   Monocytes Relative 5 %   Monocytes Absolute 0.5 0.1 - 1.0 K/uL   Eosinophils Relative 1 %   Eosinophils Absolute 0.1 0.0 - 0.7 K/uL   Basophils Relative 1 %   Basophils Absolute 0.1 0.0 - 0.1 K/uL    Current Facility-Administered Medications  Medication Dose Route Frequency Provider Last Rate Last Dose  . benztropine (COGENTIN) tablet 1 mg  1 mg Oral BID Patrecia Pour, NP      . diphenhydrAMINE (BENADRYL) injection 50 mg  50 mg Intramuscular Once Danayah Smyre, MD      . haloperidol (HALDOL) tablet 5 mg  5 mg Oral BID Patrecia Pour, NP      . haloperidol decanoate (HALDOL DECANOATE) 100 MG/ML injection 100 mg  100 mg Intramuscular Once Murry Khiev, MD      . ibuprofen (ADVIL,MOTRIN) tablet 600 mg  600 mg Oral Q8H PRN Samantha Tripp Dowless, PA-C       Current Outpatient Prescriptions   Medication Sig Dispense Refill  . benztropine (COGENTIN) 1 MG tablet Take 1 tablet (1 mg total) by mouth 2 (two) times daily. (Patient not taking: Reported on 11/09/2015) 60 tablet 0  . haloperidol (HALDOL) 5 MG tablet Take 1 tablet (5 mg total) by mouth 2 (two) times daily. (Patient not taking: Reported on 11/09/2015) 60 tablet 0    Musculoskeletal: Strength & Muscle Tone: within normal limits Gait & Station: normal Patient leans: N/A  Psychiatric Specialty Exam: Physical Exam  Constitutional: She is oriented to person, place, and time. She appears well-developed and well-nourished.  HENT:  Head: Normocephalic.  Neck: Normal range of motion.  Respiratory: Effort normal.  Musculoskeletal: Normal range of motion.  Neurological: She is alert and oriented to person, place, and time.  Skin: Skin is warm and dry.  Psychiatric: Her speech is normal and behavior is normal. Judgment and thought content normal. Cognition and memory are normal. She exhibits a depressed mood.    Review of Systems  Constitutional: Negative.   HENT: Negative.   Eyes: Negative.   Respiratory: Negative.   Cardiovascular: Negative.   Gastrointestinal: Negative.   Genitourinary: Negative.   Musculoskeletal: Negative.   Skin: Negative.   Neurological: Negative.   Endo/Heme/Allergies: Negative.   Psychiatric/Behavioral: Positive for depression and suicidal ideas.    Blood pressure 122/98, pulse 61, temperature 98.4 F (36.9 C), temperature source Oral, resp. rate 18, last menstrual period 12/03/2015, SpO2 100 %.There is no height or weight on file to calculate BMI.  General Appearance: Casual  Eye Contact:  Good  Speech:  Normal Rate  Volume:  Normal  Mood:  Depressed, mild  Affect:  Congruent  Thought Process:  Coherent and Descriptions of Associations: Intact  Orientation:  Full (Time, Place, and Person)  Thought Content:  WDL  Suicidal Thoughts:  No  Homicidal Thoughts:  No  Memory:  Immediate;    Good Recent;  Good Remote;   Good  Judgement:  Fair  Insight:  Fair  Psychomotor Activity:  Normal  Concentration:  Concentration: Good and Attention Span: Good  Recall:  [place] of Knowledge:  Fair  Language:  Good  Akathisia:  No  Handed:  Right  AIMS (if indicated):     Assets:  Leisure Time Physical Health Resilience Social Support  ADL's:  Intact  Cognition:  WNL  Sleep:        Treatment Plan Summary: Daily contact with patient to assess and evaluate symptoms and progress in treatment, Medication management and Plan schizoaffective disorder, bipolar type:  -Crisis stabilization -Medication management:  Restarted Haldol 5 mg BID for psychosis and cogentin 1 mg BID for EPS.   Haldol deconate ordered but she refused. -Individual counseling  Disposition: No evidence of imminent risk to self or others at present.    Waylan Boga, NP 12/26/2015 11:45 AM  Patient seen face-to-face for psychiatric evaluation, chart reviewed and case discussed with the physician extender and developed treatment plan. Reviewed the information documented and agree with the treatment plan. Corena Pilgrim, MD

## 2015-12-26 NOTE — [facility] Suicide Risk Assessment (Signed)
Suicide Risk Assessment  Discharge Assessment   [facility] Discharge Suicide Risk Assessment   Principal Problem: Schizoaffective disorder, bipolar type [facility]) Discharge Diagnoses:  Patient Active Problem List   Diagnosis Date Noted  . Schizoaffective disorder, bipolar type ([place]) [F25.0] 12/12/2006    Priority: High  . Cannabis use disorder, moderate, dependence ([place]) [F12.20] 05/12/2015    Priority: Low  . No diagnosis on Axis I [Z03.89] 08/21/2015  . Abnormal behavior [R68.89]   . Involuntary commitment [Z04.6]   . Schizophrenia ([place]) [F20.9]   . Disturbance in affect ([place]) [F39]   . Tobacco use disorder [F17.200] 05/12/2015  . Hypertension [I10] 02/24/2011  . ANEMIA-IRON DEFICIENCY [D50.9] 12/13/2006  . GERD [K21.9] 12/13/2006  . OBESITY [E66.9] 12/12/2006  . HEMORRHOIDS, NOS [K64.9] 12/12/2006  . PAIN-NECK [M54.2] 12/12/2006  . TREMOR [R25.9] 12/12/2006    Total Time spent with patient: 45 minutes   Musculoskeletal: Strength & Muscle Tone: within normal limits Gait & Station: normal Patient leans: N/A  Psychiatric Specialty Exam: Physical Exam  Constitutional: She is oriented to person, place, and time. She appears well-developed and well-nourished.  HENT:  Head: Normocephalic.  Neck: Normal range of motion.  Respiratory: Effort normal.  Musculoskeletal: Normal range of motion.  Neurological: She is alert and oriented to person, place, and time.  Skin: Skin is warm and dry.  Psychiatric: Her speech is normal and behavior is normal. Judgment and thought content normal. Cognition and memory are normal. She exhibits a depressed mood.    Review of Systems  Constitutional: Negative.   HENT: Negative.   Eyes: Negative.   Respiratory: Negative.   Cardiovascular: Negative.   Gastrointestinal: Negative.   Genitourinary: Negative.   Musculoskeletal: Negative.   Skin: Negative.   Neurological: Negative.   Endo/Heme/Allergies: Negative.   Psychiatric/Behavioral: Positive  for depression and suicidal ideas.    Blood pressure 122/98, pulse 61, temperature 98.4 F (36.9 C), temperature source Oral, resp. rate 18, last menstrual period 12/03/2015, SpO2 100 %.There is no height or weight on file to calculate BMI.  General Appearance: Casual  Eye Contact:  Good  Speech:  Normal Rate  Volume:  Normal  Mood:  Depressed, mild  Affect:  Congruent  Thought Process:  Coherent and Descriptions of Associations: Intact  Orientation:  Full (Time, Place, and Person)  Thought Content:  WDL  Suicidal Thoughts:  No  Homicidal Thoughts:  No  Memory:  Immediate;   Good Recent;   Good Remote;   Good  Judgement:  Fair  Insight:  Fair  Psychomotor Activity:  Normal  Concentration:  Concentration: Good and Attention Span: Good  Recall:  [employer] of Knowledge:  Fair  Language:  Good  Akathisia:  No  Handed:  Right  AIMS (if indicated):     Assets:  Leisure Time Physical Health Resilience Social Support  ADL's:  Intact  Cognition:  WNL  Sleep:       Mental Status Per Nursing Assessment::   On Admission:   suicidal ideations  Demographic Factors:  Female  Loss Factors: NA  Historical Factors: NA  Risk Reduction Factors:   Positive social support and Positive therapeutic relationship  Continued Clinical Symptoms:  Depression, mild  Cognitive Features That Contribute To Risk:  None    Suicide Risk:  Minimal: No identifiable suicidal ideation.  Patients presenting with no risk factors but with morbid ruminations; may be classified as minimal risk based on the severity of the depressive symptoms    Plan Of Care/Follow-up recommendations:  Activity:  as tolerated Diet:  heart healthy diet  Sriram Febles, NP 12/26/2015, 11:57 AM

## 2015-12-26 NOTE — ED Notes (Signed)
Pt is angry that her ACT team get her check to manage her affairs. She said that she can manage fine without a payee. She said that she does not have food in the house to eat and that is the reason that she wants to remain here. Her judgement is poor. She insists that she is suicidal. She is irritable and rude to staff. She refused haldol and cogentin this morning stating that it makes her shake.

## 2015-12-26 NOTE — ED Notes (Signed)
Patient was sleeping at this time will let her rest and let day shift to obtain vitals when patient wakes up for breakfast

## 2015-12-26 NOTE — Discharge Instructions (Signed)
You are encouraged to follow up with your [facility] ACCT team provider for further care and continuation of care.

## 2015-12-26 NOTE — ED Notes (Signed)
Pt discharged to her ACT representative. She grumbled as she left and security had to make an appearance to help her to motivate herself to get out of the bed. After she left she was cooperative until she got to the lobby. All of her belongings were returned to her. She refused to sign her discharge papers and refused to sign for her belongings. This Probation officer also escorted pt, with security presence, to the lobby. She was heard saying, "I'll be back tomorrow."

## 2016-03-05 IMAGING — CR DG CHEST 2V
2 series · 2 of 2 positions shown · non-contrast
Comparison: 02/24/2011

CLINICAL DATA: Chest pain and shortness of breath for 3 days.
Smoker.

EXAM:
CHEST  2 VIEW

[w chest pa]
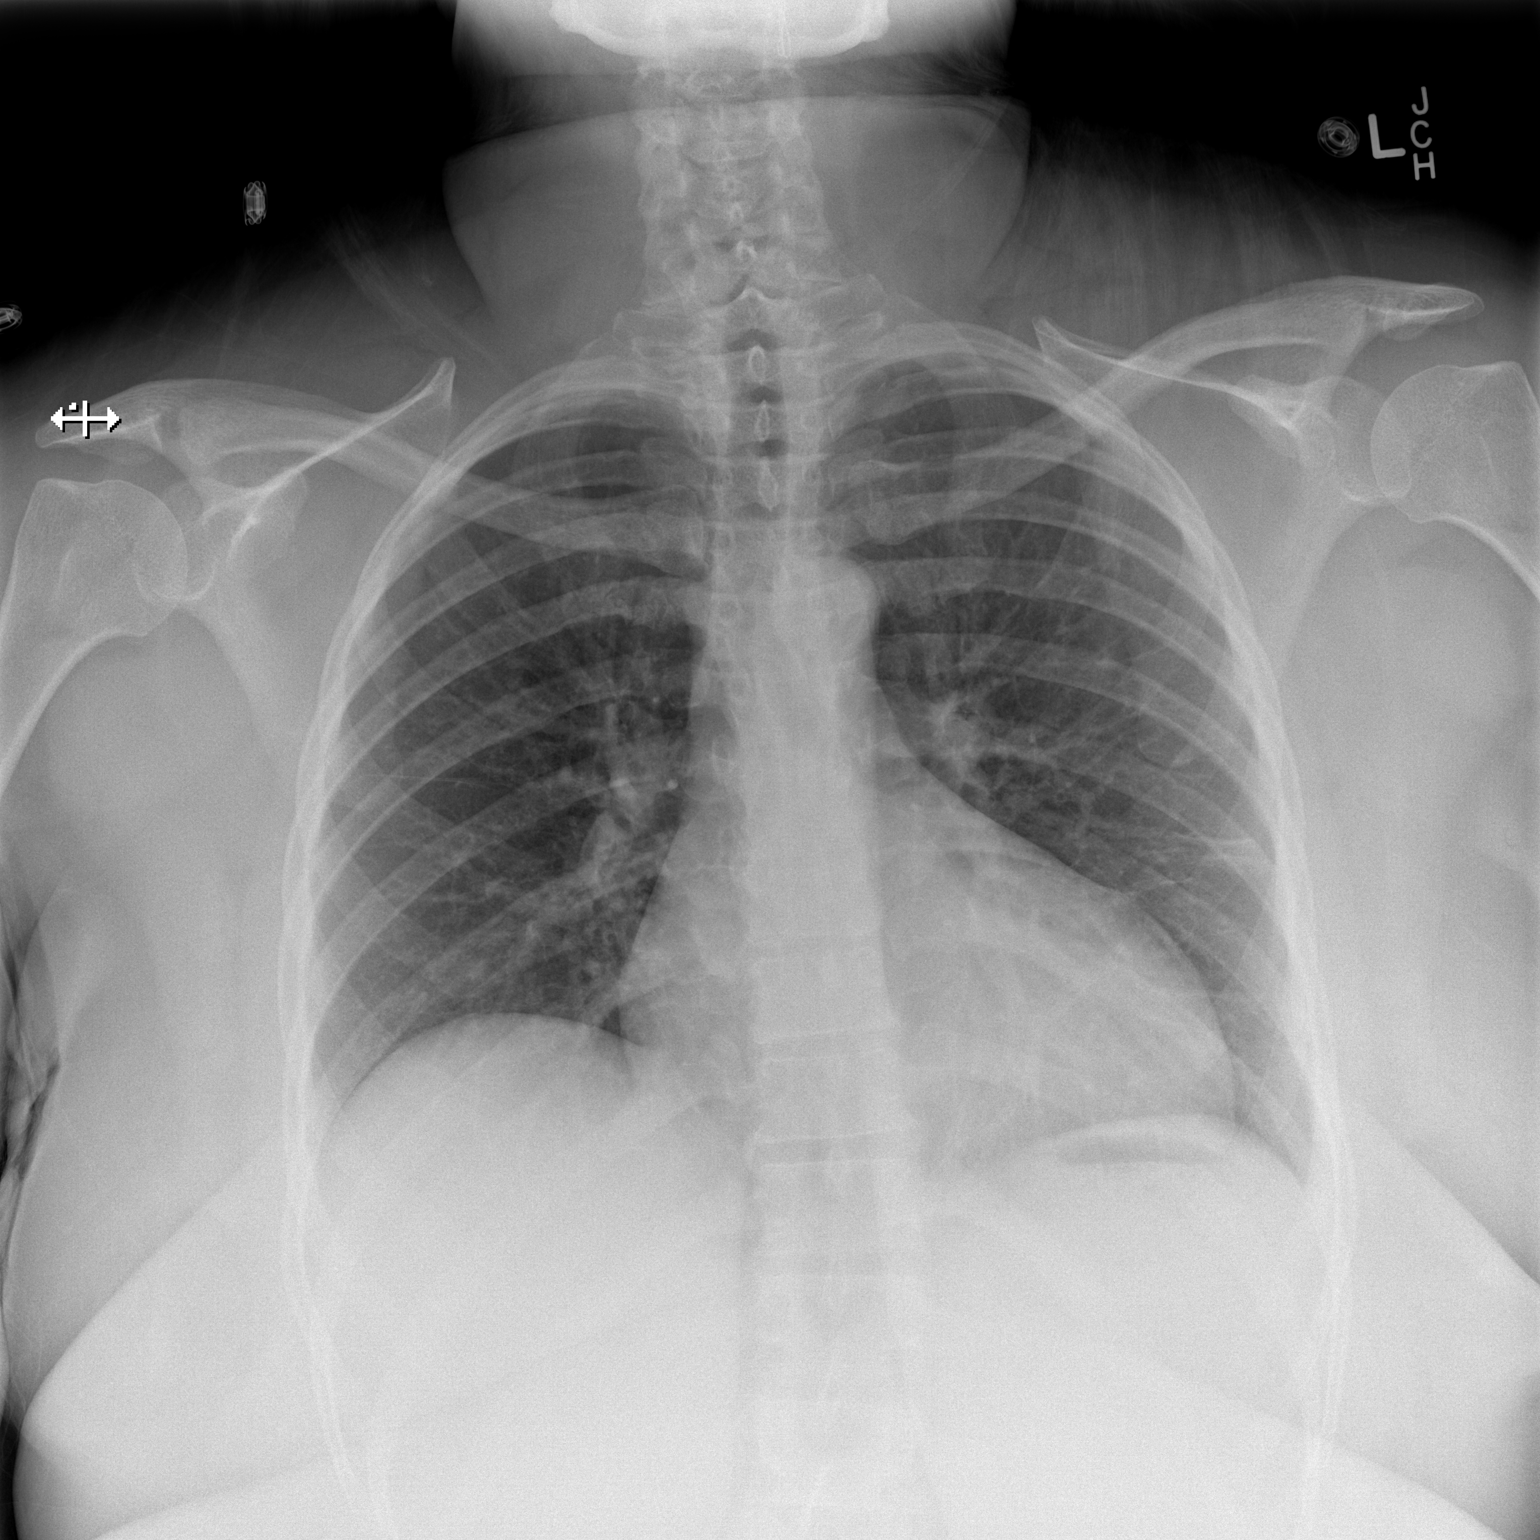

[w chest lat]
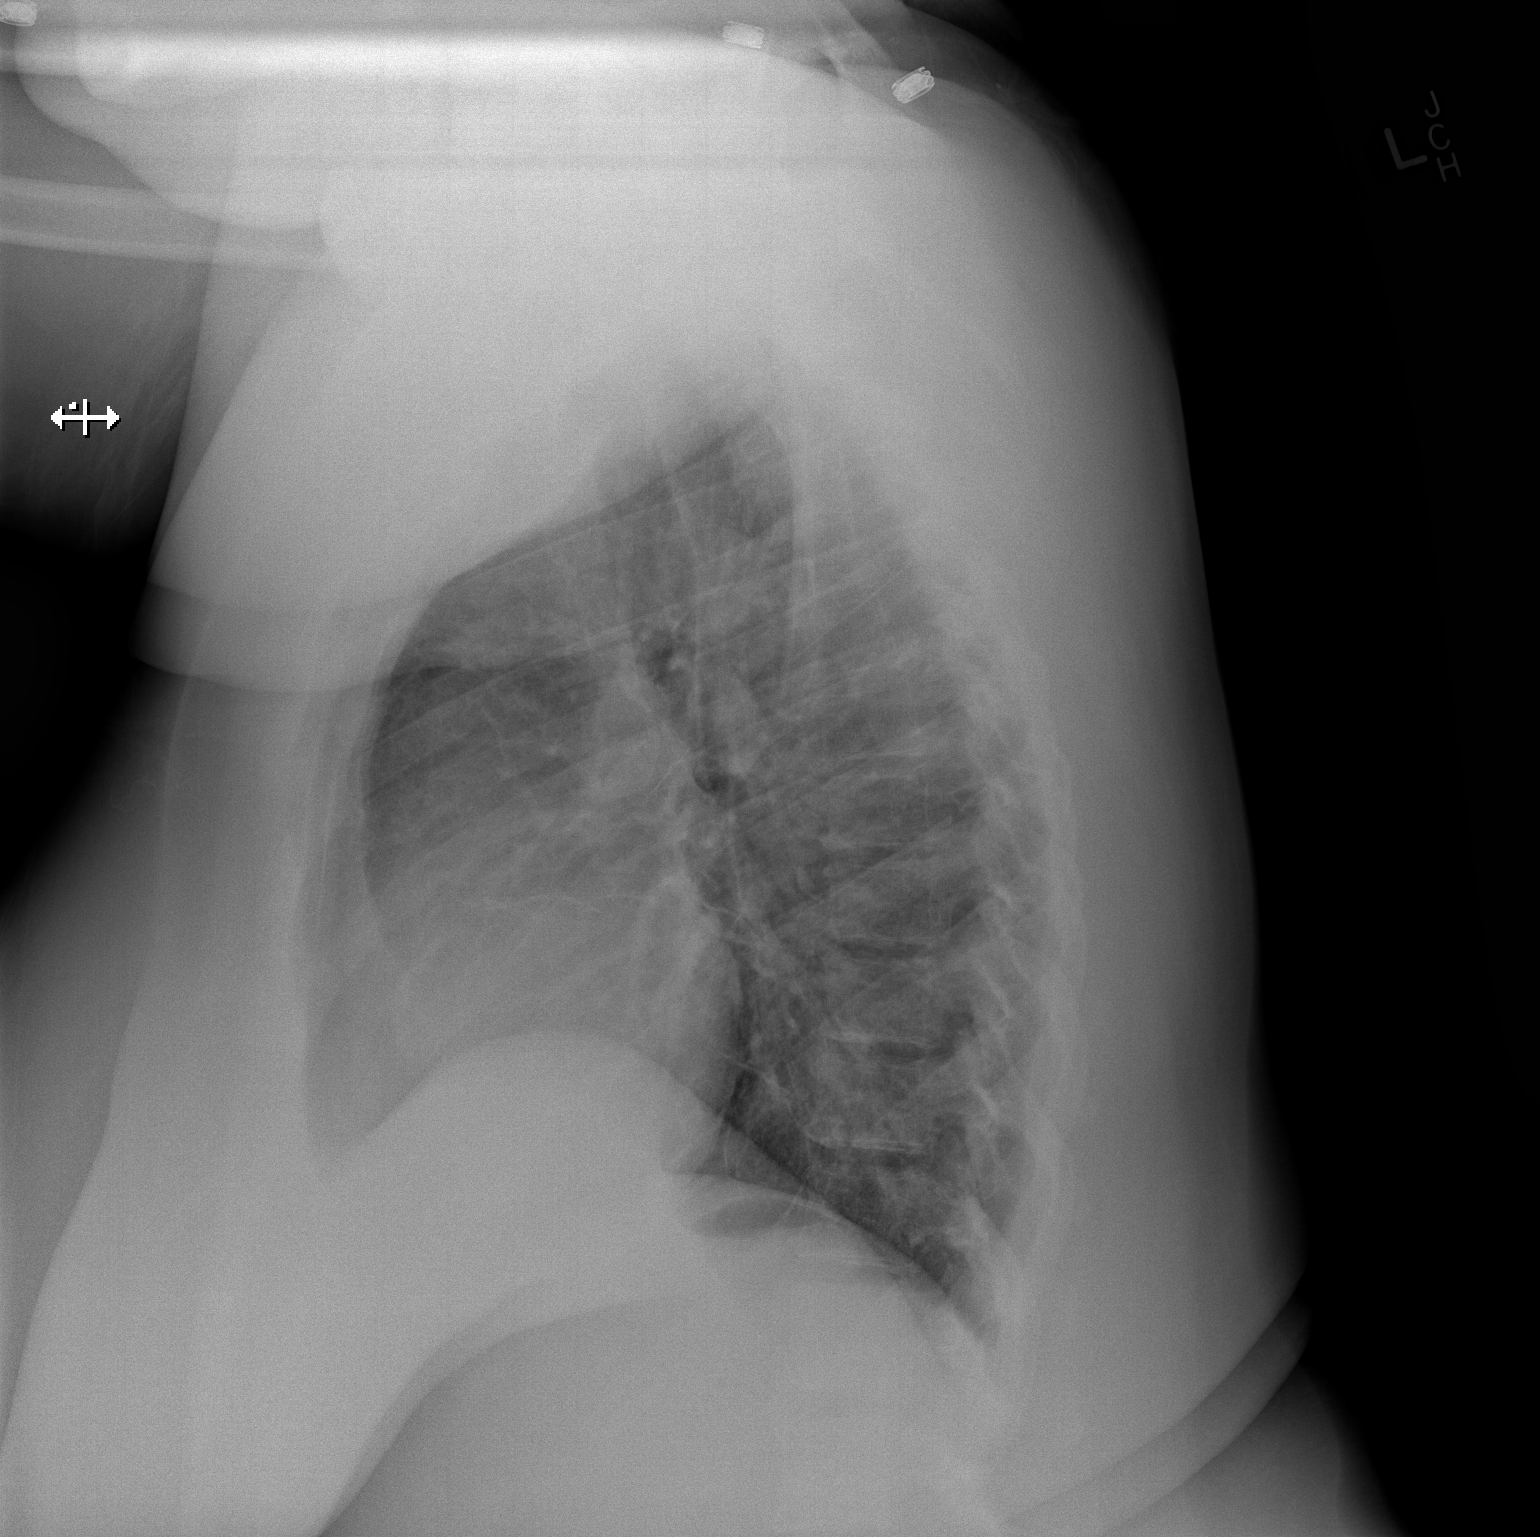

[2 of 2 positions shown; findings below may reference images not displayed]

FINDINGS: Shallow inspiration. Linear atelectasis/ fibrosis in the left mid
lung. No change since previous study. The heart size and mediastinal
contours are within normal limits. Both lungs are clear. The
visualized skeletal structures are unremarkable.
IMPRESSION: No active cardiopulmonary disease.

## 2016-03-10 ENCOUNTER — Emergency Department (HOSPITAL_COMMUNITY)
Admission: EM | Admit: 2016-03-10 | Discharge: 2016-03-11 | Disposition: A | Discharge status: Other Acute Inpatient Hospital | Payer: Medicaid Other | Attending: Emergency Medicine | Admitting: Emergency Medicine

## 2016-03-10 ENCOUNTER — Encounter (HOSPITAL_COMMUNITY): Payer: Self-pay | Admitting: Emergency Medicine

## 2016-03-10 DIAGNOSIS — F17210 Nicotine dependence, cigarettes, uncomplicated: Secondary | ICD-10-CM | POA: Diagnosis not present

## 2016-03-10 DIAGNOSIS — Z79899 Other long term (current) drug therapy: Secondary | ICD-10-CM | POA: Diagnosis not present

## 2016-03-10 DIAGNOSIS — I10 Essential (primary) hypertension: Secondary | ICD-10-CM | POA: Insufficient documentation

## 2016-03-10 DIAGNOSIS — F250 Schizoaffective disorder, bipolar type: Secondary | ICD-10-CM | POA: Diagnosis not present

## 2016-03-10 DIAGNOSIS — R440 Auditory hallucinations: Secondary | ICD-10-CM | POA: Diagnosis present

## 2016-03-10 DIAGNOSIS — R443 Hallucinations, unspecified: Secondary | ICD-10-CM

## 2016-03-10 LAB — ETHANOL

## 2016-03-10 LAB — CBC
HEMATOCRIT: 23.1 % — AB (ref 36.0–46.0)
HEMOGLOBIN: 7.4 g/dL — AB (ref 12.0–15.0)
MCH: 24.3 pg — AB (ref 26.0–34.0)
MCHC: 32 g/dL (ref 30.0–36.0)
MCV: 76 fL — ABNORMAL LOW (ref 78.0–100.0)
Platelets: 320 10*3/uL (ref 150–400)
RBC: 3.04 MIL/uL — AB (ref 3.87–5.11)
RDW: 19.9 % — ABNORMAL HIGH (ref 11.5–15.5)
WBC: 9.1 10*3/uL (ref 4.0–10.5)

## 2016-03-10 LAB — COMPREHENSIVE METABOLIC PANEL
ALBUMIN: 3.8 g/dL (ref 3.5–5.0)
ALT: 9 U/L — AB (ref 14–54)
AST: 15 U/L (ref 15–41)
Alkaline Phosphatase: 64 U/L (ref 38–126)
Anion gap: 9 (ref 5–15)
BILIRUBIN TOTAL: 0.6 mg/dL (ref 0.3–1.2)
BUN: 12 mg/dL (ref 6–20)
CHLORIDE: 105 mmol/L (ref 101–111)
CO2: 24 mmol/L (ref 22–32)
CREATININE: 0.81 mg/dL (ref 0.44–1.00)
Calcium: 8.6 mg/dL — ABNORMAL LOW (ref 8.9–10.3)
GFR calc Af Amer: >60 mL/min (ref >60)
GLUCOSE: 110 mg/dL — AB (ref 65–99)
Potassium: 2.6 mmol/L — CL (ref 3.5–5.1)
Sodium: 138 mmol/L (ref 135–145)
Total Protein: 7.1 g/dL (ref 6.5–8.1)

## 2016-03-10 LAB — SALICYLATE LEVEL: Salicylate Lvl: <7 mg/dL (ref 2.8–30.0)

## 2016-03-10 LAB — ACETAMINOPHEN LEVEL: Acetaminophen (Tylenol), Serum: <10 ug/mL — ABNORMAL LOW (ref 10–30)

## 2016-03-10 LAB — I-STAT BETA HCG BLOOD, ED (MC, WL, AP ONLY): I-stat hCG, quantitative: <5 m[IU]/mL (ref <5)

## 2016-03-10 MED ORDER — BENZTROPINE MESYLATE 1 MG PO TABS
1.0000 mg | ORAL_TABLET | Freq: Two times a day (BID) | ORAL | Status: DC
  Administered 2016-03-10 – 2016-03-11 (×2): 1 mg via ORAL
  Filled 2016-03-10 (×2): qty 1

## 2016-03-10 MED ORDER — PERPHENAZINE 2 MG PO TABS
1.0000 mg | ORAL_TABLET | Freq: Two times a day (BID) | ORAL | Status: DC
Start: 2016-03-10 — End: 2016-03-11
  Administered 2016-03-10 – 2016-03-11 (×2): 1 mg via ORAL
  Filled 2016-03-10 (×3): qty 1

## 2016-03-10 MED ORDER — NICOTINE 21 MG/24HR TD PT24
21.0000 mg | MEDICATED_PATCH | Freq: Every day | TRANSDERMAL | Status: DC
  Administered 2016-03-11: 21 mg via TRANSDERMAL
  Filled 2016-03-10: qty 1

## 2016-03-10 NOTE — ED Provider Notes (Signed)
[facility] DEPT Provider Note   CSN: [id number] Arrival date & time: 03/10/16  2033   Level V caveat psychiatric complaints  History   Chief Complaint Chief Complaint  Patient presents with  . Hallucinations    Auditory    HPI [name] is a 45 y.o. female.Patient reports she is hearing voices. She's been out of her perphenazine for the past 2 weeks. Voices are criticizing her, making her feel uncomfortable. She denies wanting to harm herself or others. She also is seeing visions of herself having an affair with Obama. No other associated symptoms  HPI  Past Medical History:  Diagnosis Date  . Arthritis   . Bipolar 1 disorder ([place])   . Bronchitis   . Depression   . Hemorrhoid   . Schizoaffective disorder ([place])   . Schizophrenia ([place])    Pt denies and reports it is schizoaffective disorder.   . Transfusion history     Patient Active Problem List   Diagnosis Date Noted  . No diagnosis on Axis I 08/21/2015  . Abnormal behavior   . Involuntary commitment   . Schizophrenia ([place])   . Disturbance in affect ([place])   . Tobacco use disorder 05/12/2015  . Cannabis use disorder, moderate, dependence ([place]) 05/12/2015  . Hypertension 02/24/2011  . ANEMIA-IRON DEFICIENCY 12/13/2006  . GERD 12/13/2006  . OBESITY 12/12/2006  . Schizoaffective disorder, bipolar type ([place]) 12/12/2006  . HEMORRHOIDS, NOS 12/12/2006  . PAIN-NECK 12/12/2006  . TREMOR 12/12/2006    Past Surgical History:  Procedure Laterality Date  . CESAREAN SECTION      OB History    No data available       Home Medications    Prior to Admission medications   Medication Sig Start Date End Date Taking? Authorizing Provider  naproxen sodium (ANAPROX) 220 MG tablet Take 220 mg by mouth 2 (two) times daily with a meal.   Yes Historical Provider, MD  benztropine (COGENTIN) 1 MG tablet Take 1 tablet (1 mg total) by mouth 2 (two) times daily. Patient not taking: Reported on 11/09/2015 08/14/15    Delfin Gant, NP  haloperidol (HALDOL) 5 MG tablet Take 1 tablet (5 mg total) by mouth 2 (two) times daily. Patient not taking: Reported on 11/09/2015 08/14/15   Delfin Gant, NP  PERPHENAZINE PO Take 1 tablet by mouth 2 (two) times daily.    Historical Provider, MD    Family History Family History  Problem Relation Age of Onset  . Diabetes type II Other     Social History Social History  Substance Use Topics  . Smoking status: Current Every Day Smoker    Packs/day: 0.50    Years: 17.00    Types: Cigarettes  . Smokeless tobacco: Never Used  . Alcohol use No   No illicit drug use  Allergies   Latuda [lurasidone hcl]; Flagyl [metronidazole hcl]; Seroquel [quetiapine fumarate]; and Sulfa antibiotics   Review of Systems Review of Systems  Unable to perform ROS: Psychiatric disorder  Psychiatric/Behavioral: Positive for dysphoric mood and hallucinations.     Physical Exam Updated Vital Signs BP 153/87 (BP Location: Left Arm)   Pulse 77   Temp 98.8 F (37.1 C) (Oral)   Resp 18   Ht 5\' 4"  (1.626 m)   Wt 200 lb (90.7 kg)   LMP 03/01/2016   SpO2 98%   BMI 34.33 kg/m   Physical Exam  Constitutional: She appears well-developed and well-nourished.  HENT:  Head: Normocephalic and atraumatic.  Eyes: Conjunctivae are normal. Pupils are equal, round, and reactive to light.  Neck: Neck supple. No tracheal deviation present. No thyromegaly present.  Cardiovascular: Normal rate and regular rhythm.   No murmur heard. Pulmonary/Chest: Effort normal and breath sounds normal.  Abdominal: Soft. Bowel sounds are normal. She exhibits no distension. There is no tenderness.  Obese  Musculoskeletal: Normal range of motion. She exhibits no edema or tenderness.  Neurological: She is alert. Coordination normal.  Skin: Skin is warm and dry. No rash noted.  Psychiatric: She has a normal mood and affect.  Nursing note and vitals reviewed.    ED Treatments / Results   Labs (all labs ordered are listed, but only abnormal results are displayed) Labs Reviewed  COMPREHENSIVE METABOLIC PANEL  ETHANOL  SALICYLATE LEVEL  ACETAMINOPHEN LEVEL  CBC  RAPID URINE DRUG SCREEN, HOSP PERFORMED  I-STAT BETA HCG BLOOD, ED ([facility], [facility], [facility] ONLY)    Results for orders placed or performed during the hospital encounter of 03/10/16  Comprehensive metabolic panel  Result Value Ref Range   Sodium 138 135 - 145 mmol/L   Potassium 2.6 (LL) 3.5 - 5.1 mmol/L   Chloride 105 101 - 111 mmol/L   CO2 24 22 - 32 mmol/L   Glucose, Bld 110 (H) 65 - 99 mg/dL   BUN 12 6 - 20 mg/dL   Creatinine, Ser 0.81 0.44 - 1.00 mg/dL   Calcium 8.6 (L) 8.9 - 10.3 mg/dL   Total Protein 7.1 6.5 - 8.1 g/dL   Albumin 3.8 3.5 - 5.0 g/dL   AST 15 15 - 41 U/L   ALT 9 (L) 14 - 54 U/L   Alkaline Phosphatase 64 38 - 126 U/L   Total Bilirubin 0.6 0.3 - 1.2 mg/dL   GFR calc non Af Amer >60 >60 mL/min   GFR calc Af Amer >60 >60 mL/min   Anion gap 9 5 - 15  Ethanol  Result Value Ref Range   Alcohol, Ethyl (B) <5 <5 mg/dL  Salicylate level  Result Value Ref Range   Salicylate Lvl [id number] 2.8 - 30.0 mg/dL  Acetaminophen level  Result Value Ref Range   Acetaminophen (Tylenol), Serum <10 (L) 10 - 30 ug/mL  cbc  Result Value Ref Range   WBC 9.1 4.0 - 10.5 K/uL   RBC 3.04 (L) 3.87 - 5.11 MIL/uL   Hemoglobin 7.4 (L) 12.0 - 15.0 g/dL   HCT 23.1 (L) 36.0 - 46.0 %   MCV 76.0 (L) 78.0 - 100.0 fL   MCH 24.3 (L) 26.0 - 34.0 pg   MCHC 32.0 30.0 - 36.0 g/dL   RDW 19.9 (H) 11.5 - 15.5 %   Platelets 320 150 - 400 K/uL  I-Stat beta hCG blood, ED  Result Value Ref Range   I-stat hCG, quantitative <5.0 <5 mIU/mL   Comment 3           No results found.  EKG  EKG Interpretation  Date/Time:  Thursday March 11 2016 00:41:19 EST Ventricular Rate:  66 PR Interval:    QRS Duration: 96 QT Interval:  401 QTC Calculation: 421 R Axis:   70 Text Interpretation:  Sinus rhythm Low voltage, precordial leads No  significant change since last tracing Confirmed by Martiza Speth  MD, Akai Dollard ([id number]) on 03/11/2016 12:55:29 AM     1 AM patient is resting comfortably. And in no distress.   Radiology No results found.  Procedures Procedures (including critical care time)  Medications Ordered in ED Medications  nicotine (NICODERM CQ - dosed in mg/24 hours) patch 21 mg (not administered)     Initial Impression / Assessment and Plan / ED Course  I have reviewed the triage vital signs and the nursing notes.  Pertinent labs & imaging results that were available during my care of the patient were reviewed by me and considered in my medical decision making (see chart for details).  Clinical Course     Anemia is chronic. Noted to be hypokalemic. Oral potassium supplementation ordered. Patient has ekg changes of hypokalemia and is a normal sinus rhythm on cardiac monitor. TTS consult recommends inpatient psychiatric admission.Pt signed out to Dr. Eliane Decree 1am whowill recheck serum potassium and serum Magnessium  Final Clinical Impressions(s) / ED Diagnoses  Diagnosis #1 acute psychosis #2 hypokalemia #3 anemia Final diagnoses:  None    New Prescriptions New Prescriptions   No medications on file     Orlie Dakin, MD 03/11/16 0106
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
## 2016-03-10 NOTE — ED Triage Notes (Signed)
Patient from home brought in by EMS for auditory hallucinations. Patient was recently in jail and has not been taking medications consistantly. She states the "Act Team" has not been doing their job helping her. Patient reports having some visual hallucinations off and on, but denies them at this time. Patient states voices are talking to her, but are not telling her to harm herself of others, but are telling her critical and negative things about herself.

## 2016-03-10 NOTE — ED Notes (Signed)
Bed: [id number] Expected date:  Expected time:  Means of arrival:  Comments: EMS- hearing voices

## 2016-03-11 LAB — RAPID URINE DRUG SCREEN, HOSP PERFORMED
Amphetamines: NOT DETECTED
BARBITURATES: NOT DETECTED
Benzodiazepines: NOT DETECTED
Cocaine: NOT DETECTED
OPIATES: NOT DETECTED
TETRAHYDROCANNABINOL: NOT DETECTED

## 2016-03-11 LAB — POTASSIUM: POTASSIUM: 3.2 mmol/L — AB (ref 3.5–5.1)

## 2016-03-11 LAB — MAGNESIUM: Magnesium: 1.9 mg/dL (ref 1.7–2.4)

## 2016-03-11 MED ORDER — PERPHENAZINE 2 MG PO TABS
2.0000 mg | ORAL_TABLET | Freq: Two times a day (BID) | ORAL | Status: DC
  Filled 2016-03-11: qty 1

## 2016-03-11 MED ORDER — AMANTADINE HCL 100 MG PO CAPS: 100.0000 mg | ORAL_CAPSULE | Freq: Two times a day (BID) | ORAL | Status: DC

## 2016-03-11 MED ORDER — HYDROXYZINE HCL 25 MG PO TABS
25.0000 mg | ORAL_TABLET | Freq: Four times a day (QID) | ORAL | Status: DC | PRN
  Administered 2016-03-11: 25 mg via ORAL
  Filled 2016-03-11: qty 1

## 2016-03-11 MED ORDER — POTASSIUM CHLORIDE CRYS ER 20 MEQ PO TBCR
40.0000 meq | EXTENDED_RELEASE_TABLET | Freq: Once | ORAL | Status: AC
  Administered 2016-03-11: 40 meq via ORAL
  Filled 2016-03-11: qty 2

## 2016-03-11 MED ORDER — POTASSIUM CHLORIDE CRYS ER 20 MEQ PO TBCR
20.0000 meq | EXTENDED_RELEASE_TABLET | Freq: Every day | ORAL | Status: DC
  Administered 2016-03-11: 20 meq via ORAL
  Filled 2016-03-11: qty 1

## 2016-03-11 NOTE — ED Notes (Signed)
Patient went to restroom, but forgot to give urine sample. Given water and will try again soon.

## 2016-03-11 NOTE — ED Provider Notes (Signed)
Reevaluation, to determine necessity for involuntary commitment. At this time. The patient is alert, calm, cooperative. She admits to persistent hallucinations, which tell her that she is in a relationship with Pres. Obama. She denies suicidal ideation or plan at this time. The voices distress her. She feels like she cannot make choices on her own. She states that she is not taking her psychiatric medications because the ACT team did not bring them to me.  Involuntary paperwork filled out by me.  Patient to be transferred to [facility] psychiatric facility   Daleen Bo, MD 03/12/16 [phone]

## 2016-03-11 NOTE — ED Provider Notes (Signed)
Patient left at change of shift to recheck her potassium level this morning and also check her magnesium level. Patient is here for psychiatric evaluation and was noted to have hypokalemia. EKG already has been done.   5:18 AM her potassium has improved from 2.6 to 3.2. Her magnesium level was normal. She was given an additional 40 mEq of oral potassium.  Results for orders placed or performed during the hospital encounter of 03/10/16  Comprehensive metabolic panel  Result Value Ref Range   Sodium 138 135 - 145 mmol/L   Potassium 2.6 (LL) 3.5 - 5.1 mmol/L   Chloride 105 101 - 111 mmol/L   CO2 24 22 - 32 mmol/L   Glucose, Bld 110 (H) 65 - 99 mg/dL   BUN 12 6 - 20 mg/dL   Creatinine, Ser 0.81 0.44 - 1.00 mg/dL   Calcium 8.6 (L) 8.9 - 10.3 mg/dL   Total Protein 7.1 6.5 - 8.1 g/dL   Albumin 3.8 3.5 - 5.0 g/dL   AST 15 15 - 41 U/L   ALT 9 (L) 14 - 54 U/L   Alkaline Phosphatase 64 38 - 126 U/L   Total Bilirubin 0.6 0.3 - 1.2 mg/dL   GFR calc non Af Amer >60 >60 mL/min   GFR calc Af Amer >60 >60 mL/min   Anion gap 9 5 - 15  Ethanol  Result Value Ref Range   Alcohol, Ethyl (B) <5 <5 mg/dL  Salicylate level  Result Value Ref Range   Salicylate Lvl [id number] 2.8 - 30.0 mg/dL  Acetaminophen level  Result Value Ref Range   Acetaminophen (Tylenol), Serum <10 (L) 10 - 30 ug/mL  cbc  Result Value Ref Range   WBC 9.1 4.0 - 10.5 K/uL   RBC 3.04 (L) 3.87 - 5.11 MIL/uL   Hemoglobin 7.4 (L) 12.0 - 15.0 g/dL   HCT 23.1 (L) 36.0 - 46.0 %   MCV 76.0 (L) 78.0 - 100.0 fL   MCH 24.3 (L) 26.0 - 34.0 pg   MCHC 32.0 30.0 - 36.0 g/dL   RDW 19.9 (H) 11.5 - 15.5 %   Platelets 320 150 - 400 K/uL  Rapid urine drug screen (hospital performed)  Result Value Ref Range   Opiates NONE DETECTED NONE DETECTED   Cocaine NONE DETECTED NONE DETECTED   Benzodiazepines NONE DETECTED NONE DETECTED   Amphetamines NONE DETECTED NONE DETECTED   Tetrahydrocannabinol NONE DETECTED NONE DETECTED   Barbiturates NONE  DETECTED NONE DETECTED  Magnesium  Result Value Ref Range   Magnesium 1.9 1.7 - 2.4 mg/dL  Potassium  Result Value Ref Range   Potassium 3.2 (L) 3.5 - 5.1 mmol/L  I-Stat beta hCG blood, ED  Result Value Ref Range   I-stat hCG, quantitative <5.0 <5 mIU/mL   Comment 3            Rolland Porter, MD, [facility]      Rolland Porter, MD 03/11/16 [id number]
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
## 2016-03-11 NOTE — ED Notes (Signed)
C/o breast "itching" and anxiety.  PRN medication requested and received.

## 2016-03-11 NOTE — BH Assessment (Addendum)
[facility] Assessment Progress Note  Per Ambrose Finland, MD, this pt requires psychiatric hospitalization at this time.  Gerald Stabs calls from [facility] to report that their provider is agreeable to admitting pt, conditioned upon pt being placed under IVC.  Waylan Boga, DNP and EDP Daleen Bo, MD concur with this decision, and Dr Eulis Foster has initiated IVC.  IVC documents have been faxed to [facility], and at [employer] [facility] confirms receipt.  This Probation officer then calls back to [place] at [facility], and at 15:34 he reports that pt has been accepted by Dr [name].  He requests, however, that IVC documents, including Findings and Custody Order, be faxed to him at [phone] prior to pt being sent.  Pt's nurse, Otho Perl, has been notified; she agrees to inform me when IVC documents are served, and will call report to [phone] when the time comes.  Jalene Mullet, MA Triage Specialist [phone]   Addendum:  IVC documents have been served, and have been faxed to [place] has confirmed receipt.  Otho Perl has been informed to proceed with transfer.  Pt is to be transported via [facility].  Jalene Mullet, [place] Triage Specialist [phone]

## 2016-03-11 NOTE — ED Notes (Signed)
Pt transferred from [place], presents with complaint of auditory hallucinations, not taking meds.  Denies SI, HI or visual hallucinations.  Denies feeling hopeless. Awake, alert & responsive, no distress noted.  Monitoring for safety, Q 15 min checks in effect. SAFETY CHECK FOR CONTRABAND COMPLETED.

## 2016-03-11 NOTE — ED Notes (Signed)
Reviewed medication and education done.

## 2016-05-09 ENCOUNTER — Encounter (HOSPITAL_COMMUNITY): Payer: Self-pay | Admitting: Emergency Medicine

## 2016-05-09 ENCOUNTER — Emergency Department (HOSPITAL_COMMUNITY)
Admission: EM | Admit: 2016-05-09 | Discharge: 2016-05-09 | Disposition: A | Discharge status: Home / Self Care | Payer: Medicaid Other | Attending: Emergency Medicine | Admitting: Emergency Medicine

## 2016-05-09 DIAGNOSIS — F17210 Nicotine dependence, cigarettes, uncomplicated: Secondary | ICD-10-CM | POA: Diagnosis not present

## 2016-05-09 DIAGNOSIS — N760 Acute vaginitis: Secondary | ICD-10-CM | POA: Diagnosis not present

## 2016-05-09 DIAGNOSIS — M25552 Pain in left hip: Secondary | ICD-10-CM | POA: Diagnosis not present

## 2016-05-09 DIAGNOSIS — Z79899 Other long term (current) drug therapy: Secondary | ICD-10-CM | POA: Diagnosis not present

## 2016-05-09 DIAGNOSIS — M25551 Pain in right hip: Secondary | ICD-10-CM | POA: Insufficient documentation

## 2016-05-09 DIAGNOSIS — B9689 Other specified bacterial agents as the cause of diseases classified elsewhere: Secondary | ICD-10-CM | POA: Diagnosis not present

## 2016-05-09 DIAGNOSIS — I10 Essential (primary) hypertension: Secondary | ICD-10-CM | POA: Diagnosis not present

## 2016-05-09 LAB — COMPREHENSIVE METABOLIC PANEL
ALBUMIN: 3.9 g/dL (ref 3.5–5.0)
ALT: 8 U/L — ABNORMAL LOW (ref 14–54)
ANION GAP: 10 (ref 5–15)
AST: 14 U/L — AB (ref 15–41)
Alkaline Phosphatase: 71 U/L (ref 38–126)
BILIRUBIN TOTAL: 0.3 mg/dL (ref 0.3–1.2)
BUN: 10 mg/dL (ref 6–20)
CHLORIDE: 103 mmol/L (ref 101–111)
CO2: 24 mmol/L (ref 22–32)
Calcium: 9.4 mg/dL (ref 8.9–10.3)
Creatinine, Ser: 0.9 mg/dL (ref 0.44–1.00)
GFR calc Af Amer: >60 mL/min (ref >60)
GFR calc non Af Amer: >60 mL/min (ref >60)
GLUCOSE: 99 mg/dL (ref 65–99)
POTASSIUM: 3.9 mmol/L (ref 3.5–5.1)
SODIUM: 137 mmol/L (ref 135–145)
TOTAL PROTEIN: 7.5 g/dL (ref 6.5–8.1)

## 2016-05-09 LAB — CBC WITH DIFFERENTIAL/PLATELET
BASOS ABS: 0 10*3/uL (ref 0.0–0.1)
BASOS PCT: 0 %
EOS ABS: 0.1 10*3/uL (ref 0.0–0.7)
Eosinophils Relative: 0 %
HEMATOCRIT: 27.3 % — AB (ref 36.0–46.0)
Hemoglobin: 8.5 g/dL — ABNORMAL LOW (ref 12.0–15.0)
Lymphocytes Relative: 41 %
Lymphs Abs: 5 10*3/uL — ABNORMAL HIGH (ref 0.7–4.0)
MCH: 24.1 pg — ABNORMAL LOW (ref 26.0–34.0)
MCHC: 31.1 g/dL (ref 30.0–36.0)
MCV: 77.3 fL — ABNORMAL LOW (ref 78.0–100.0)
MONO ABS: 0.6 10*3/uL (ref 0.1–1.0)
Monocytes Relative: 5 %
NEUTROS ABS: 6.6 10*3/uL (ref 1.7–7.7)
NEUTROS PCT: 54 %
Platelets: 347 10*3/uL (ref 150–400)
RBC: 3.53 MIL/uL — ABNORMAL LOW (ref 3.87–5.11)
RDW: 18.6 % — AB (ref 11.5–15.5)
WBC: 12.2 10*3/uL — ABNORMAL HIGH (ref 4.0–10.5)

## 2016-05-09 LAB — WET PREP, GENITAL
SPERM: NONE SEEN
TRICH WET PREP: NONE SEEN
YEAST WET PREP: NONE SEEN

## 2016-05-09 LAB — URINALYSIS, ROUTINE W REFLEX MICROSCOPIC
Bilirubin Urine: NEGATIVE
Glucose, UA: NEGATIVE mg/dL
Hgb urine dipstick: NEGATIVE
Ketones, ur: NEGATIVE mg/dL
LEUKOCYTES UA: NEGATIVE
NITRITE: NEGATIVE
PH: 5 (ref 5.0–8.0)
Protein, ur: NEGATIVE mg/dL
SPECIFIC GRAVITY, URINE: 1.024 (ref 1.005–1.030)

## 2016-05-09 LAB — I-STAT BETA HCG BLOOD, ED (MC, WL, AP ONLY): I-stat hCG, quantitative: <5 m[IU]/mL (ref <5)

## 2016-05-09 MED ORDER — NAPROXEN 375 MG PO TABS: 375.0000 mg | ORAL_TABLET | Freq: Two times a day (BID) | ORAL | 0 refills | Status: DC

## 2016-05-09 MED ORDER — FLUCONAZOLE 150 MG PO TABS: 150.0000 mg | ORAL_TABLET | Freq: Every day | ORAL | 0 refills | Status: AC

## 2016-05-09 MED ORDER — NAPROXEN 250 MG PO TABS
500.0000 mg | ORAL_TABLET | Freq: Once | ORAL | Status: AC
  Administered 2016-05-09: 500 mg via ORAL
  Filled 2016-05-09: qty 2

## 2016-05-09 MED ORDER — METRONIDAZOLE 0.75 % VA GEL: 1.0000 | Freq: Two times a day (BID) | VAGINAL | 0 refills | Status: DC

## 2016-05-09 NOTE — ED Triage Notes (Signed)
Pt. arrived with EMS from home report chronic bilateral hip pain for > 2 weeks , denies injury/ambulatory , pt. added malodorous vaginal discharge with itching for 1 1/2 weeks . Denies dysuria or fever .

## 2016-05-09 NOTE — ED Provider Notes (Addendum)
[facility] DEPT Provider Note   CSN: [id number] Arrival date & time: 05/09/16  0222     History   Chief Complaint Chief Complaint  Patient presents with  . Hip Pain  . Vaginal Discharge    HPI [name] is a 46 y.o. female.  The patient complains of bilateral hip pain she describes as "I have DJD and it makes my hips hurt", with pain that has been uncontrolled x 2 months. She reports she usually takes Naproxen but has been out of this medication. She is taking ibuprofen but states it does not control her pain. No new injury. No abdominal pain, nausea, vomiting, constipation. She does report a new vaginal discharge x 2 months that causes significant itching and vaginal swelling. No blisters, vaginal pain. No dysuria, irregular vaginal bleeding.    The history is provided by the patient. No language interpreter was used.  Hip Pain  Pertinent negatives include no abdominal pain.  Vaginal Discharge   Pertinent negatives include no fever, no abdominal pain, no constipation, no diarrhea, no nausea, no vomiting and no dysuria.    Past Medical History:  Diagnosis Date  . Arthritis   . Bipolar 1 disorder ([place])   . Bronchitis   . Depression   . Hemorrhoid   . Schizoaffective disorder ([place])   . Schizophrenia ([place])    Pt denies and reports it is schizoaffective disorder.   . Transfusion history     Patient Active Problem List   Diagnosis Date Noted  . No diagnosis on Axis I 08/21/2015  . Abnormal behavior   . Involuntary commitment   . Schizophrenia ([place])   . Disturbance in affect ([place])   . Tobacco use disorder 05/12/2015  . Cannabis use disorder, moderate, dependence ([place]) 05/12/2015  . Hypertension 02/24/2011  . ANEMIA-IRON DEFICIENCY 12/13/2006  . GERD 12/13/2006  . OBESITY 12/12/2006  . Schizoaffective disorder, bipolar type ([place]) 12/12/2006  . HEMORRHOIDS, NOS 12/12/2006  . PAIN-NECK 12/12/2006  . TREMOR 12/12/2006    Past Surgical History:    Procedure Laterality Date  . CESAREAN SECTION      OB History    No data available       Home Medications    Prior to Admission medications   Medication Sig Start Date End Date Taking? Authorizing Provider  benztropine (COGENTIN) 1 MG tablet Take 1 tablet (1 mg total) by mouth 2 (two) times daily. Patient not taking: Reported on 11/09/2015 08/14/15   Delfin Gant, NP  haloperidol (HALDOL) 5 MG tablet Take 1 tablet (5 mg total) by mouth 2 (two) times daily. Patient not taking: Reported on 11/09/2015 08/14/15   Delfin Gant, NP  naproxen sodium (ANAPROX) 220 MG tablet Take 220 mg by mouth 2 (two) times daily with a meal.    Historical Provider, MD  PERPHENAZINE PO Take 1 tablet by mouth 2 (two) times daily.    Historical Provider, MD    Family History Family History  Problem Relation Age of Onset  . Diabetes type II Other     Social History Social History  Substance Use Topics  . Smoking status: Current Every Day Smoker    Packs/day: 0.50    Years: 17.00    Types: Cigarettes  . Smokeless tobacco: Never Used  . Alcohol use No     Allergies   Latuda [lurasidone hcl]; Flagyl [metronidazole hcl]; Seroquel [quetiapine fumarate]; and Sulfa antibiotics   Review of Systems Review of Systems  Constitutional: Negative for chills and  fever.  HENT: Negative.   Respiratory: Negative.   Cardiovascular: Negative.   Gastrointestinal: Negative.  Negative for abdominal pain, constipation, diarrhea, nausea and vomiting.  Genitourinary: Positive for vaginal discharge. Negative for dysuria, menstrual problem and vaginal pain.  Musculoskeletal: Negative.   Skin: Negative.   Neurological: Negative.      Physical Exam Updated Vital Signs BP (!) 126/48   Pulse 74   Temp 98.7 F (37.1 C) (Oral)   Resp 16   Ht 5\' 4"  (1.626 m)   Wt 104 kg   LMP 04/25/2016 (Approximate)   SpO2 99%   BMI 39.36 kg/m   Physical Exam  Constitutional: She appears well-developed and  well-nourished.  HENT:  Head: Normocephalic.  Neck: Normal range of motion. Neck supple.  Cardiovascular: Normal rate and regular rhythm.   Pulmonary/Chest: Effort normal and breath sounds normal.  Abdominal: Soft. Bowel sounds are normal. There is no tenderness. There is no rebound and no guarding.  Genitourinary:  Genitourinary Comments: Small amount of white, vaginal discharge. No CMT, adnexal tenderness or mass.   Musculoskeletal: Normal range of motion.  Hips with bilateral FROM. She is fully weight bearing.  Neurological: She is alert. No cranial nerve deficit.  Skin: Skin is warm and dry. No rash noted.  Psychiatric: She has a normal mood and affect.     ED Treatments / Results  Labs (all labs ordered are listed, but only abnormal results are displayed) Labs Reviewed  WET PREP, GENITAL - Abnormal; Notable for the following:       Result Value   Clue Cells Wet Prep HPF POC PRESENT (*)    WBC, Wet Prep HPF POC MODERATE (*)    All other components within normal limits  CBC WITH DIFFERENTIAL/PLATELET - Abnormal; Notable for the following:    WBC 12.2 (*)    RBC 3.53 (*)    Hemoglobin 8.5 (*)    HCT 27.3 (*)    MCV 77.3 (*)    MCH 24.1 (*)    RDW 18.6 (*)    Lymphs Abs 5.0 (*)    All other components within normal limits  COMPREHENSIVE METABOLIC PANEL - Abnormal; Notable for the following:    AST 14 (*)    ALT 8 (*)    All other components within normal limits  URINALYSIS, ROUTINE W REFLEX MICROSCOPIC  I-STAT BETA HCG BLOOD, ED ([facility], [facility], [facility] ONLY)    EKG  EKG Interpretation None       Radiology No results found.  Procedures Procedures (including critical care time)  Medications Ordered in ED Medications  naproxen (NAPROSYN) tablet 500 mg (500 mg Oral Given 05/09/16 0716)     Initial Impression / Assessment and Plan / ED Course  I have reviewed the triage vital signs and the nursing notes.  Pertinent labs & imaging results that were available during my  care of the patient were reviewed by me and considered in my medical decision making (see chart for details).     Patient with 2 months of symptoms of bilateral hip pain usually controlled with Naproxen but she is out. She also reports vaginal itching. No evidence PID without CMT, purulent discharge or pelvic tenderness after 2 months of symptoms. Will treat symptomatically as yeast.   Encourage PCP follow.  On discharge the patient states she is also concerned for pressure at her rectum. No bleeding, constipation or significant pain. No numbness. There is nothing focal on exam - no hemorrhoids, induration, redness, fissure or bleeding.  Recommended PCP follow up.  Final Clinical Impressions(s) / ED Diagnoses   Final diagnoses:  None   1. Bilateral hip pain 2. Vaginal itching  New Prescriptions New Prescriptions   No medications on file     Dennie Bible 05/09/16 [place], MD 05/09/16 0800    Charlann Lange, PA-C 05/09/16 [place], MD 05/09/16 [phone]

## 2016-08-24 ENCOUNTER — Emergency Department (HOSPITAL_COMMUNITY): Payer: Medicaid Other

## 2016-08-24 ENCOUNTER — Emergency Department (HOSPITAL_COMMUNITY)
Admission: EM | Admit: 2016-08-24 | Discharge: 2016-08-25 | Disposition: A | Discharge status: Home / Self Care | Payer: Medicaid Other | Attending: Emergency Medicine | Admitting: Emergency Medicine

## 2016-08-24 DIAGNOSIS — Z79899 Other long term (current) drug therapy: Secondary | ICD-10-CM | POA: Insufficient documentation

## 2016-08-24 DIAGNOSIS — I10 Essential (primary) hypertension: Secondary | ICD-10-CM | POA: Diagnosis not present

## 2016-08-24 DIAGNOSIS — R519 Headache, unspecified: Secondary | ICD-10-CM

## 2016-08-24 DIAGNOSIS — F17210 Nicotine dependence, cigarettes, uncomplicated: Secondary | ICD-10-CM | POA: Diagnosis not present

## 2016-08-24 DIAGNOSIS — R1110 Vomiting, unspecified: Secondary | ICD-10-CM

## 2016-08-24 DIAGNOSIS — R251 Tremor, unspecified: Secondary | ICD-10-CM | POA: Insufficient documentation

## 2016-08-24 DIAGNOSIS — R112 Nausea with vomiting, unspecified: Secondary | ICD-10-CM | POA: Diagnosis not present

## 2016-08-24 DIAGNOSIS — R51 Headache: Secondary | ICD-10-CM | POA: Diagnosis present

## 2016-08-24 MED ORDER — BENZTROPINE MESYLATE 1 MG/ML IJ SOLN
1.0000 mg | Freq: Once | INTRAMUSCULAR | Status: AC
Start: 2016-08-24 — End: 2016-08-25
  Administered 2016-08-25: 1 mg via INTRAVENOUS
  Filled 2016-08-24: qty 2

## 2016-08-24 MED ORDER — ONDANSETRON 4 MG PO TBDP
4.0000 mg | ORAL_TABLET | Freq: Once | ORAL | Status: AC | PRN
  Administered 2016-08-24: 4 mg via ORAL
  Filled 2016-08-24: qty 1

## 2016-08-24 MED ORDER — DIPHENHYDRAMINE HCL 50 MG/ML IJ SOLN
25.0000 mg | Freq: Once | INTRAMUSCULAR | Status: AC
  Administered 2016-08-25: 25 mg via INTRAVENOUS
  Filled 2016-08-24: qty 1

## 2016-08-24 NOTE — ED Triage Notes (Signed)
PT RECEIVED FROM HOME VIA EMS C/O N/V X4 HRS. PER EMS, NO VOMITING EN ROUTE.

## 2016-08-24 NOTE — ED Notes (Signed)
Patient stated they had been stuck twice for blood draw already and did not want to be stuck again until she got her IV.

## 2016-08-24 NOTE — ED Notes (Signed)
Patient states they just used the restroom and would let us know when able to void again for urine specimen.

## 2016-08-24 NOTE — ED Notes (Signed)
Pt states she had a prolixin shot @ 2pm today.

## 2016-08-24 NOTE — ED Notes (Signed)
I attempted twice and was unsuccessful to collect labs

## 2016-08-24 NOTE — ED Notes (Addendum)
Pt is alert and oriented x 4 and is verbally responsive. Pt reports that she had a HA earlier today and then began having Nausea and vomiting  4 episodes, and 1 episode of diarrhea. Pt report that she had panic attacks earlier today. Pt is observed having gross tremors with head shaking is noted, pt states that she is recently placed on prolixa and pt feels that it is causing her shakes.

## 2016-08-24 NOTE — ED Provider Notes (Signed)
[facility] DEPT Provider Note   CSN: [id number] Arrival date & time: 08/24/16  2032     History   Chief Complaint Chief Complaint  Patient presents with  . Emesis    HPI [name] is a 46 y.o. female with a hx of arthritis, schizoaffective disorder, bipolar disorder presents to the Emergency Department complaining of gradual, improved frontal headache onset this afternoon and associated with several episodes of NBNB emesis and diarrhea without melena or hematochezia.  Pt reports mild abdominal cramping, but no focal abdominal pain.  Pt also c/o tremors for the last 4 weeks after receiving an injection of Prolixin.  She reports she was subsequently prescribed cogentin for this which she has been taking without relief BID.  Pt also takes haldol and mysoline.  She has not had her cogentin tonight.  Nothing makes it better and nothing makes it worse.  Pt denies fever, chills, neck pain, chest pain, SOB, weakness, dizziness, syncope, dysuria.       The history is provided by the patient and medical records. No language interpreter was used.    Past Medical History:  Diagnosis Date  . Arthritis   . Bipolar 1 disorder ([place])   . Bronchitis   . Depression   . Hemorrhoid   . Schizoaffective disorder ([place])   . Schizophrenia ([place])    Pt denies and reports it is schizoaffective disorder.   . Transfusion history     Patient Active Problem List   Diagnosis Date Noted  . No diagnosis on Axis I 08/21/2015  . Abnormal behavior   . Involuntary commitment   . Schizophrenia ([place])   . Disturbance in affect ([place])   . Tobacco use disorder 05/12/2015  . Cannabis use disorder, moderate, dependence ([place]) 05/12/2015  . Hypertension 02/24/2011  . ANEMIA-IRON DEFICIENCY 12/13/2006  . GERD 12/13/2006  . OBESITY 12/12/2006  . Schizoaffective disorder, bipolar type ([place]) 12/12/2006  . HEMORRHOIDS, NOS 12/12/2006  . PAIN-NECK 12/12/2006  . TREMOR 12/12/2006    Past Surgical History:    Procedure Laterality Date  . CESAREAN SECTION      OB History    No data available       Home Medications    Prior to Admission medications   Medication Sig Start Date End Date Taking? Authorizing Provider  benztropine (COGENTIN) 1 MG tablet Take 1 tablet (1 mg total) by mouth 2 (two) times daily. 08/14/15  Yes Delfin Gant, NP  fluPHENAZine decanoate (PROLIXIN) 25 MG/ML injection Inject 25 mg into the muscle every 14 (fourteen) days.   Yes [provider]  naproxen (NAPROSYN) 375 MG tablet Take 1 tablet (375 mg total) by mouth 2 (two) times daily. 05/09/16  Yes Upstill, [name], PA-C  primidone (MYSOLINE) 50 MG tablet Take 50 mg by mouth 2 (two) times daily. 03/18/16  Yes [provider]  diphenhydrAMINE (BENADRYL) 25 MG tablet Take 1 tablet (25 mg total) by mouth every 6 (six) hours as needed for itching (Rash). 08/25/16   Jaziya Obarr, Jarrett Soho, PA-C  haloperidol (HALDOL) 5 MG tablet Take 1 tablet (5 mg total) by mouth 2 (two) times daily. Patient not taking: Reported on 11/09/2015 08/14/15   Delfin Gant, NP  metroNIDAZOLE (METROGEL VAGINAL) 0.75 % vaginal gel Place 1 Applicatorful vaginally 2 (two) times daily. Patient not taking: Reported on 08/24/2016 05/09/16   Charlann Lange, PA-C    Family History Family History  Problem Relation Age of Onset  . Diabetes type II Other  Social History Social History  Substance Use Topics  . Smoking status: Current Every Day Smoker    Packs/day: 0.50    Years: 17.00    Types: Cigarettes  . Smokeless tobacco: Never Used  . Alcohol use No     Allergies   Latuda [lurasidone hcl]; Abilify [aripiprazole]; Flagyl [metronidazole hcl]; Risperidone and related; Seroquel [quetiapine fumarate]; and Sulfa antibiotics   Review of Systems Review of Systems  Constitutional: Negative for chills and fever.  HENT: Negative for congestion, rhinorrhea and sinus pain.   Eyes: Negative for visual disturbance.   Respiratory: Negative for shortness of breath.   Cardiovascular: Negative for chest pain.  Gastrointestinal: Positive for diarrhea, nausea and vomiting.  Genitourinary: Negative for dysuria and hematuria.  Musculoskeletal: Negative for back pain and neck pain.  Skin: Negative for rash.  Neurological: Positive for tremors and headaches.  Psychiatric/Behavioral: Negative for hallucinations.  All other systems reviewed and are negative.    Physical Exam Updated Vital Signs BP (!) 132/59 (BP Location: Right Arm)   Pulse (!) 58   Temp 98.1 F (36.7 C) (Oral)   Resp 16   SpO2 96%   Physical Exam  Constitutional: She is oriented to person, place, and time. She appears well-developed and well-nourished. No distress.  HENT:  Head: Normocephalic and atraumatic.  Mouth/Throat: Oropharynx is clear and moist.  Eyes: Conjunctivae and EOM are normal. Pupils are equal, round, and reactive to light. No scleral icterus.  No horizontal, vertical or rotational nystagmus  Neck: Normal range of motion. Neck supple.  Full active and passive ROM without pain No midline or paraspinal tenderness No nuchal rigidity or meningeal signs  Cardiovascular: Normal rate, regular rhythm and intact distal pulses.   Pulmonary/Chest: Effort normal and breath sounds normal. No respiratory distress. She has no wheezes. She has no rales.  Abdominal: Soft. Bowel sounds are normal. There is no tenderness. There is no rigidity, no rebound, no guarding and no CVA tenderness.  Musculoskeletal: Normal range of motion.  Lymphadenopathy:    She has no cervical adenopathy.  Neurological: She is alert and oriented to person, place, and time. No cranial nerve deficit. She exhibits normal muscle tone. Coordination normal.  Mental Status:  Alert, oriented, thought content appropriate. Speech fluent without evidence of aphasia. Able to follow 2 step commands without difficulty.  Cranial Nerves:  II:  Peripheral visual fields  grossly normal, pupils equal, round, reactive to light III,IV, VI: ptosis not present, extra-ocular motions intact bilaterally  V,VII: smile symmetric, facial light touch sensation equal VIII: hearing grossly normal bilaterally  IX,X: midline uvula rise  XI: bilateral shoulder shrug equal and strong XII: midline tongue extension  Motor:  5/5 in upper and lower extremities bilaterally including strong and equal grip strength and dorsiflexion/plantar flexion Tremor noted to the hands, arms and head - significantly lessened when distracted or with intention Sensory: Pinprick and light touch normal in all extremities.  Cerebellar: normal finger-to-nose with bilateral upper extremities Gait: normal gait and balance CV: distal pulses palpable throughout   Skin: Skin is warm and dry. No rash noted. She is not diaphoretic.  Psychiatric: She has a normal mood and affect. Her behavior is normal. Judgment and thought content normal.  Nursing note and vitals reviewed.    ED Treatments / Results  Labs (all labs ordered are listed, but only abnormal results are displayed) Labs Reviewed  COMPREHENSIVE METABOLIC PANEL - Abnormal; Notable for the following:       Result Value  Glucose, Bld 103 (*)    ALT 10 (*)    All other components within normal limits  CBC - Abnormal; Notable for the following:    WBC 11.6 (*)    RBC 3.50 (*)    Hemoglobin 8.6 (*)    HCT 27.4 (*)    MCH 24.6 (*)    RDW 18.9 (*)    All other components within normal limits  URINALYSIS, ROUTINE W REFLEX MICROSCOPIC - Abnormal; Notable for the following:    Color, Urine STRAW (*)    Hgb urine dipstick MODERATE (*)    Squamous Epithelial / LPF 0-5 (*)    All other components within normal limits  LIPASE, BLOOD  POC URINE PREG, ED    Radiology Ct Head Wo Contrast  Result Date: 08/25/2016 CLINICAL DATA:  Headache earlier today followed by nausea and vomiting. Tremors. EXAM: CT HEAD WITHOUT CONTRAST TECHNIQUE: Contiguous  axial images were obtained from the base of the skull through the vertex without intravenous contrast. COMPARISON:  02/24/2011 FINDINGS: Brain: No evidence of acute infarction, hemorrhage, hydrocephalus, extra-axial collection or mass lesion/mass effect. Vascular: No hyperdense vessel or unexpected calcification. Skull: Normal. Negative for fracture or focal lesion. Sinuses/Orbits: No acute finding. Other: No significant change since previous study. IMPRESSION: No acute intracranial abnormalities. Electronically Signed   By: Lucienne Capers M.D.   On: 08/25/2016 00:07    Procedures Procedures (including critical care time)  Medications Ordered in ED Medications  ondansetron (ZOFRAN-ODT) disintegrating tablet 4 mg (4 mg Oral Given 08/24/16 2113)  benztropine mesylate (COGENTIN) injection 1 mg (1 mg Intravenous Given 08/25/16 0005)  diphenhydrAMINE (BENADRYL) injection 25 mg (25 mg Intravenous Given 08/25/16 0006)     Initial Impression / Assessment and Plan / ED Course  I have reviewed the triage vital signs and the nursing notes.  Pertinent labs & imaging results that were available during my care of the patient were reviewed by me and considered in my medical decision making (see chart for details).  Clinical Course as of Aug 25 125  Wed Aug 25, 2016  0126 Pt with complete resolution of headache. No emesis in the ED.  Pt without tremor when sleeping and significantly decreased tremor since benadryl administration.  [HM]    Clinical Course User Index [HM] Rolla Servidio, Jarrett Soho, PA-C    Pt presents with No complaints. She comes in for frontal headache, nausea and vomiting. Headache was not sudden onset or thunderclap. Highly doubt SAH or ICH. Normal neurologic exam. No emesis here in the department. Abdomen is soft and nontender. Patient has tolerated by mouth without difficulty.  Patient reports she is also concerned about her tremor. She states it began 4 weeks ago after beginning a new  antipsychotic. She's had issues in the past with antipsychotics and tremors. Cogentin has not improved the symptoms.  Patient given nightly dose of Cogentin and IV Benadryl with significant improvement in her tremors. Even before Benadryl administration, her tremors improved when she was distracted. Discussed with patient the need for close follow-up with her primary care and ACT team for further discussion of medication changes. Patient states understanding and is in agreement with the plan.  The patient was discussed with and seen by Dr. Regenia Skeeter who agrees with the treatment plan.   Final Clinical Impressions(s) / ED Diagnoses   Final diagnoses:  Non-intractable vomiting, presence of nausea not specified, unspecified vomiting type  Nonintractable headache, unspecified chronicity pattern, unspecified headache type  Tremor    New Prescriptions New  Prescriptions   DIPHENHYDRAMINE (BENADRYL) 25 MG TABLET    Take 1 tablet (25 mg total) by mouth every 6 (six) hours as needed for itching (Rash).     Raniyah Curenton, Gwenlyn Perking 08/25/16 0131    Sherwood Gambler, MD 08/26/16 1414

## 2016-08-25 LAB — COMPREHENSIVE METABOLIC PANEL
ALK PHOS: 70 U/L (ref 38–126)
ALT: 10 U/L — AB (ref 14–54)
AST: 20 U/L (ref 15–41)
Albumin: 3.9 g/dL (ref 3.5–5.0)
Anion gap: 7 (ref 5–15)
BUN: 9 mg/dL (ref 6–20)
CALCIUM: 8.9 mg/dL (ref 8.9–10.3)
CO2: 27 mmol/L (ref 22–32)
CREATININE: 0.76 mg/dL (ref 0.44–1.00)
Chloride: 103 mmol/L (ref 101–111)
GFR calc non Af Amer: >60 mL/min (ref >60)
GLUCOSE: 103 mg/dL — AB (ref 65–99)
Potassium: 3.8 mmol/L (ref 3.5–5.1)
Sodium: 137 mmol/L (ref 135–145)
Total Bilirubin: 0.3 mg/dL (ref 0.3–1.2)
Total Protein: 7.7 g/dL (ref 6.5–8.1)

## 2016-08-25 LAB — URINALYSIS, ROUTINE W REFLEX MICROSCOPIC
Bacteria, UA: NONE SEEN
Bilirubin Urine: NEGATIVE
GLUCOSE, UA: NEGATIVE mg/dL
KETONES UR: NEGATIVE mg/dL
Leukocytes, UA: NEGATIVE
Nitrite: NEGATIVE
PH: 8 (ref 5.0–8.0)
PROTEIN: NEGATIVE mg/dL
SPECIFIC GRAVITY, URINE: 1.008 (ref 1.005–1.030)

## 2016-08-25 LAB — CBC
HCT: 27.4 % — ABNORMAL LOW (ref 36.0–46.0)
Hemoglobin: 8.6 g/dL — ABNORMAL LOW (ref 12.0–15.0)
MCH: 24.6 pg — ABNORMAL LOW (ref 26.0–34.0)
MCHC: 31.4 g/dL (ref 30.0–36.0)
MCV: 78.3 fL (ref 78.0–100.0)
PLATELETS: 348 10*3/uL (ref 150–400)
RBC: 3.5 MIL/uL — AB (ref 3.87–5.11)
RDW: 18.9 % — ABNORMAL HIGH (ref 11.5–15.5)
WBC: 11.6 10*3/uL — ABNORMAL HIGH (ref 4.0–10.5)

## 2016-08-25 LAB — LIPASE, BLOOD: Lipase: 16 U/L (ref 11–51)

## 2016-08-25 LAB — POC URINE PREG, ED: Preg Test, Ur: NEGATIVE

## 2016-08-25 MED ORDER — DIPHENHYDRAMINE HCL 25 MG PO TABS: 25.0000 mg | ORAL_TABLET | Freq: Four times a day (QID) | ORAL | 0 refills | Status: DC | PRN

## 2016-08-25 NOTE — Discharge Instructions (Signed)
1. Medications: benadryl, usual home medications 2. Treatment: rest, drink plenty of fluids,  3. Follow Up: Please followup with your primary doctor in 1-2 days for discussion of your diagnoses and further evaluation after today's visit; if you do not have a primary care doctor use the resource guide provided to find one; Please return to the ER for worsening symptoms, persistent headaches or other concerns

## 2016-10-28 ENCOUNTER — Emergency Department (HOSPITAL_COMMUNITY)
Admission: EM | Admit: 2016-10-28 | Discharge: 2016-10-28 | Disposition: A | Discharge status: Home / Self Care | Payer: Medicaid Other | Attending: Emergency Medicine | Admitting: Emergency Medicine

## 2016-10-28 ENCOUNTER — Encounter (HOSPITAL_COMMUNITY): Payer: Self-pay

## 2016-10-28 DIAGNOSIS — F250 Schizoaffective disorder, bipolar type: Secondary | ICD-10-CM | POA: Diagnosis not present

## 2016-10-28 DIAGNOSIS — R45851 Suicidal ideations: Secondary | ICD-10-CM | POA: Insufficient documentation

## 2016-10-28 DIAGNOSIS — F17210 Nicotine dependence, cigarettes, uncomplicated: Secondary | ICD-10-CM | POA: Diagnosis not present

## 2016-10-28 DIAGNOSIS — Z79899 Other long term (current) drug therapy: Secondary | ICD-10-CM | POA: Insufficient documentation

## 2016-10-28 DIAGNOSIS — I10 Essential (primary) hypertension: Secondary | ICD-10-CM | POA: Insufficient documentation

## 2016-10-28 LAB — COMPREHENSIVE METABOLIC PANEL
ALBUMIN: 4.2 g/dL (ref 3.5–5.0)
ALK PHOS: 67 U/L (ref 38–126)
ALT: 9 U/L — AB (ref 14–54)
AST: 15 U/L (ref 15–41)
Anion gap: 10 (ref 5–15)
BILIRUBIN TOTAL: 0.6 mg/dL (ref 0.3–1.2)
BUN: 8 mg/dL (ref 6–20)
CALCIUM: 9.2 mg/dL (ref 8.9–10.3)
CO2: 21 mmol/L — ABNORMAL LOW (ref 22–32)
Chloride: 104 mmol/L (ref 101–111)
Creatinine, Ser: 0.79 mg/dL (ref 0.44–1.00)
GFR calc Af Amer: >60 mL/min (ref >60)
GFR calc non Af Amer: >60 mL/min (ref >60)
GLUCOSE: 109 mg/dL — AB (ref 65–99)
Potassium: 3.3 mmol/L — ABNORMAL LOW (ref 3.5–5.1)
Sodium: 135 mmol/L (ref 135–145)
TOTAL PROTEIN: 8.3 g/dL — AB (ref 6.5–8.1)

## 2016-10-28 LAB — CBC
HEMATOCRIT: 29.2 % — AB (ref 36.0–46.0)
HEMOGLOBIN: 9.3 g/dL — AB (ref 12.0–15.0)
MCH: 23.9 pg — ABNORMAL LOW (ref 26.0–34.0)
MCHC: 31.8 g/dL (ref 30.0–36.0)
MCV: 75.1 fL — ABNORMAL LOW (ref 78.0–100.0)
Platelets: 340 10*3/uL (ref 150–400)
RBC: 3.89 MIL/uL (ref 3.87–5.11)
RDW: 18.3 % — AB (ref 11.5–15.5)
WBC: 9.1 10*3/uL (ref 4.0–10.5)

## 2016-10-28 LAB — SALICYLATE LEVEL: Salicylate Lvl: <7 mg/dL (ref 2.8–30.0)

## 2016-10-28 LAB — ACETAMINOPHEN LEVEL: Acetaminophen (Tylenol), Serum: <10 ug/mL — ABNORMAL LOW (ref 10–30)

## 2016-10-28 LAB — ETHANOL: Alcohol, Ethyl (B): <5 mg/dL (ref <5)

## 2016-10-28 MED ORDER — IBUPROFEN 200 MG PO TABS: 600.0000 mg | ORAL_TABLET | Freq: Three times a day (TID) | ORAL | Status: DC | PRN

## 2016-10-28 MED ORDER — BENZTROPINE MESYLATE 1 MG PO TABS: 1.0000 mg | ORAL_TABLET | Freq: Two times a day (BID) | ORAL | Status: DC

## 2016-10-28 MED ORDER — ZOLPIDEM TARTRATE 5 MG PO TABS: 5.0000 mg | ORAL_TABLET | Freq: Every evening | ORAL | Status: DC | PRN

## 2016-10-28 MED ORDER — ONDANSETRON HCL 4 MG PO TABS: 4.0000 mg | ORAL_TABLET | Freq: Three times a day (TID) | ORAL | Status: DC | PRN

## 2016-10-28 MED ORDER — ALUM & MAG HYDROXIDE-SIMETH 200-200-20 MG/5ML PO SUSP
30.0000 mL | Freq: Four times a day (QID) | ORAL | Status: DC | PRN
Start: 2016-10-28 — End: 2016-10-28

## 2016-10-28 MED ORDER — NICOTINE 21 MG/24HR TD PT24: 21.0000 mg | MEDICATED_PATCH | Freq: Every day | TRANSDERMAL | Status: DC

## 2016-10-28 NOTE — ED Triage Notes (Signed)
Pt is very upset that she was given a referral for medication management. She stated that she "does not like like that place" Charge RN aware concern. Discharge an follow up instructions reviewed x 2. Pt was escorted to lobby by security

## 2016-10-28 NOTE — ED Notes (Signed)
Pt called [facility] and EMS was dispatched Pt states that she is depressed and is suicidal for two days No harm to herself  and no plan to do so at this time Pt stop her medications on July 3rd

## 2016-10-28 NOTE — ED Provider Notes (Signed)
[facility] DEPT Provider Note   CSN: [id number] Arrival date & time: 10/28/16  4098     History   Chief Complaint Chief Complaint  Patient presents with  . Suicidal    HPI [name] is a 46 y.o. female.  HPI   46 year old female with history of bipolar disorder, schizoaffective brought here via ambulance for evaluation of suicidal ideation. Patient reports she used to take Prolixin for her psychiatric illness however it causes body tremors and her doctor recently switch her to a different medication, Fanapt.  She has been taking this medication for nearly a week. For the past 4 days she is having increased suicidal thoughts without any specific plan. She endorse not be able to sleep for the past 2 days, eating less, feeling more anxious and hopeless. She having mild auditory hallucination. No report of homicidal ideation. She feels that she is not trusting of her ACT team because she is not getting the appropriate dose of medication and reported "I need my medication". She denies any recent alcohol or illicit drug use. She is a tobacco user. She denies having any active pain.  Past Medical History:  Diagnosis Date  . Arthritis   . Bipolar 1 disorder ([place])   . Bronchitis   . Depression   . Hemorrhoid   . Schizoaffective disorder ([place])   . Schizophrenia ([place])    Pt denies and reports it is schizoaffective disorder.   . Transfusion history     Patient Active Problem List   Diagnosis Date Noted  . No diagnosis on Axis I 08/21/2015  . Abnormal behavior   . Involuntary commitment   . Schizophrenia ([place])   . Disturbance in affect ([place])   . Tobacco use disorder 05/12/2015  . Cannabis use disorder, moderate, dependence ([place]) 05/12/2015  . Hypertension 02/24/2011  . ANEMIA-IRON DEFICIENCY 12/13/2006  . GERD 12/13/2006  . OBESITY 12/12/2006  . Schizoaffective disorder, bipolar type ([place]) 12/12/2006  . HEMORRHOIDS, NOS 12/12/2006  . PAIN-NECK 12/12/2006  . TREMOR  12/12/2006    Past Surgical History:  Procedure Laterality Date  . CESAREAN SECTION      OB History    No data available       Home Medications    Prior to Admission medications   Medication Sig Start Date End Date Taking? Authorizing Provider  benztropine (COGENTIN) 1 MG tablet Take 1 tablet (1 mg total) by mouth 2 (two) times daily. 08/14/15   Delfin Gant, NP  diphenhydrAMINE (BENADRYL) 25 MG tablet Take 1 tablet (25 mg total) by mouth every 6 (six) hours as needed for itching (Rash). 08/25/16   Muthersbaugh, Jarrett Soho, PA-C  fluPHENAZine decanoate (PROLIXIN) 25 MG/ML injection Inject 25 mg into the muscle every 14 (fourteen) days.    [provider]  haloperidol (HALDOL) 5 MG tablet Take 1 tablet (5 mg total) by mouth 2 (two) times daily. Patient not taking: Reported on 11/09/2015 08/14/15   Delfin Gant, NP  metroNIDAZOLE (METROGEL VAGINAL) 0.75 % vaginal gel Place 1 Applicatorful vaginally 2 (two) times daily. Patient not taking: Reported on 08/24/2016 05/09/16   Charlann Lange, PA-C  naproxen (NAPROSYN) 375 MG tablet Take 1 tablet (375 mg total) by mouth 2 (two) times daily. 05/09/16   Charlann Lange, PA-C  primidone (MYSOLINE) 50 MG tablet Take 50 mg by mouth 2 (two) times daily. 03/18/16   [provider]    Family History Family History  Problem Relation Age of Onset  . Diabetes type II  Other     Social History Social History  Substance Use Topics  . Smoking status: Current Every Day Smoker    Packs/day: 0.50    Years: 17.00    Types: Cigarettes  . Smokeless tobacco: Never Used  . Alcohol use No     Allergies   Latuda [lurasidone hcl]; Abilify [aripiprazole]; Flagyl [metronidazole hcl]; Risperidone and related; Seroquel [quetiapine fumarate]; and Sulfa antibiotics   Review of Systems Review of Systems  All other systems reviewed and are negative.    Physical Exam Updated Vital Signs There were no vitals taken for this  visit.  Physical Exam  Constitutional: She appears well-developed and well-nourished. No distress.  Obese female, well-groomed, in no acute discomfort  HENT:  Head: Atraumatic.  Eyes: Conjunctivae are normal.  Neck: Neck supple.  Cardiovascular: Normal rate and regular rhythm.   Pulmonary/Chest: Effort normal and breath sounds normal.  Abdominal: Soft. There is no tenderness.  Neurological: She is alert.  Skin: No rash noted.  Psychiatric: She has a normal mood and affect. Her speech is normal and behavior is normal. Thought content is paranoid. She expresses suicidal ideation. She expresses no homicidal ideation.  Nursing note and vitals reviewed.    ED Treatments / Results  Labs (all labs ordered are listed, but only abnormal results are displayed) Labs Reviewed  COMPREHENSIVE METABOLIC PANEL - Abnormal; Notable for the following:       Result Value   Potassium 3.3 (*)    CO2 21 (*)    Glucose, Bld 109 (*)    Total Protein 8.3 (*)    ALT 9 (*)    All other components within normal limits  ACETAMINOPHEN LEVEL - Abnormal; Notable for the following:    Acetaminophen (Tylenol), Serum <10 (*)    All other components within normal limits  CBC - Abnormal; Notable for the following:    Hemoglobin 9.3 (*)    HCT 29.2 (*)    MCV 75.1 (*)    MCH 23.9 (*)    RDW 18.3 (*)    All other components within normal limits  ETHANOL  SALICYLATE LEVEL  RAPID URINE DRUG SCREEN, HOSP PERFORMED    EKG  EKG Interpretation None       Radiology No results found.  Procedures Procedures (including critical care time)  Medications Ordered in ED Medications  ibuprofen (ADVIL,MOTRIN) tablet 600 mg (not administered)  zolpidem (AMBIEN) tablet 5 mg (not administered)  ondansetron (ZOFRAN) tablet 4 mg (not administered)  alum & mag hydroxide-simeth (MAALOX/MYLANTA) 200-200-20 MG/5ML suspension 30 mL (not administered)  nicotine (NICODERM CQ - dosed in mg/24 hours) patch 21 mg (not  administered)     Initial Impression / Assessment and Plan / ED Course  I have reviewed the triage vital signs and the nursing notes.  Pertinent labs & imaging results that were available during my care of the patient were reviewed by me and considered in my medical decision making (see chart for details).     There were no vitals taken for this visit.   Final Clinical Impressions(s) / ED Diagnoses   Final diagnoses:  Suicidal ideation  Schizoaffective disorder, bipolar type [facility])    New Prescriptions Current Discharge Medication List     7:58 AM Pt here with SI, stating she needs to be medicated appropriately to help her cope with her mental illness. Recently started on a new antipsychotic "Fanapt".  Will perform medical clearance and will consult TTS for further psychiatric assessment.  9:53 AM Aside from mild anemia with hemoglobin of 9.3, patient is medically cleared for further psychiatric assessment.  11:37 AM Pt has been evaluated by TTS and was discharged with f/u./   Domenic Moras, PA-C 10/28/16 1138    Duffy Bruce, MD 10/29/16 [phone]

## 2016-10-28 NOTE — BH Assessment (Signed)
[facility] Assessment Progress Note  Per Waylan Boga, DNP, this pt does not require psychiatric hospitalization at this time.  Pt is to be discharged from [facility] with recommendation to continue treatment with the [facility], her current outpatient provider.  This has been included in pt's discharge instructions.  Pt's nurse, Lilia Pro, has been notified.  Jalene Mullet, [facility] Triage Specialist [phone]

## 2016-10-28 NOTE — Discharge Instructions (Signed)
For you ongoing behavioral health needs, you are advised to continue treatment with the [facility]:       [facility]      [facility], [address]      [address]      [place], [place]  [address]      [phone]

## 2016-10-28 NOTE — ED Notes (Signed)
[name], EMT at bedside collecting labs as ordered.

## 2016-10-28 NOTE — BH Assessment (Signed)
Tele Assessment Note   [name] is an 46 y.o. female. Pt reports SI with no plan. Pt denies HI and AVH. Per Pt she has not been taking her medication. The Pt is prescribed Fanapt. The Pt has been diagnosed with Schizoaffective disorder, Bipolar. The Pt has an ACTT team with [facility]. The Pt states she is in conflict with her ACTT team. Per Pt she does not trust her ACTT team. The pt states she is depressed. Per Pt she has been experiencing fatigue, decreased sleep, and decreased appetite. Pt denies SA and abuse.   Per Dr. Dwyane Dee and Theodoro Clock, DNP Pt does not meet inpatient criteria. Recommends D/C to ACTT team.   Diagnosis:  F25.0 Schizoaffective, Bipolar  Past Medical History:  Past Medical History:  Diagnosis Date  . Arthritis   . Bipolar 1 disorder ([place])   . Bronchitis   . Depression   . Hemorrhoid   . Schizoaffective disorder ([place])   . Schizophrenia ([place])    Pt denies and reports it is schizoaffective disorder.   . Transfusion history     Past Surgical History:  Procedure Laterality Date  . CESAREAN SECTION      Family History:  Family History  Problem Relation Age of Onset  . Diabetes type II Other     Social History:  reports that she has been smoking Cigarettes.  She has a 8.50 pack-year smoking history. She has never used smokeless tobacco. She reports that she does not drink alcohol or use drugs.  Additional Social History:  Alcohol / Drug Use Pain Medications: please see mar Prescriptions: please see mar Over the Counter: please see mar History of alcohol / drug use?: No history of alcohol / drug abuse Longest period of sobriety (when/how long): NA  CIWA:   COWS:    PATIENT STRENGTHS: (choose at least two) Average or above average intelligence Communication skills  Allergies:  Allergies  Allergen Reactions  . Anette Guarneri [Lurasidone Hcl] Nausea And Vomiting and Other (See Comments)    Reaction:  Shaking   . Abilify [Aripiprazole] Other (See Comments)    shaking  . Flagyl [Metronidazole Hcl] Nausea And Vomiting  . Prolixin [Fluphenazine] Other (See Comments)    shaking  . Risperidone And Related Other (See Comments)    shaking  . Seroquel [Quetiapine Fumarate]     Patient states she had a syncopal episode after taking medication.   . Sulfa Antibiotics Nausea And Vomiting    Home Medications:  (Not in a hospital admission)  OB/GYN Status:  No LMP recorded.  General Assessment Data [facility] Assessment: In system Is this a Tele or Face-to-Face Assessment?: Face-to-Face Is this an Initial Assessment or a Re-assessment for this encounter?: Initial Assessment Marital status: Separated Maiden name: NA Is patient pregnant?: No Pregnancy Status: No Living Arrangements: Alone Can pt return to current living arrangement?: Yes Admission Status: Voluntary Is patient capable of signing voluntary admission?: Yes Referral Source: Self/Family/Friend Insurance type: [place] Living Arrangements: Alone Legal Guardian: Other: (self) Name of Psychiatrist: [facility] Name of Therapist: [facility]  Education Status Is patient currently in school?: No Current Grade: NA Highest grade of school patient has completed: 12 Name of school: NA Contact person: NA  Risk to self with the past 6 months Suicidal Ideation: Yes-Currently Present Has patient been a risk to self within the past 6 months prior to admission? : Yes Suicidal Intent: No Has patient had any suicidal  intent within the past 6 months prior to admission? : No Is patient at risk for suicide?: No Suicidal Plan?: No Has patient had any suicidal plan within the past 6 months prior to admission? : No Access to Means: No What has been your use of drugs/alcohol within the last 12 months?: NA Previous Attempts/Gestures: Yes How many times?: 5 Other Self Harm Risks: NA Triggers for Past Attempts: None known Intentional Self Injurious Behavior:  None Family Suicide History: No Recent stressful life event(s): Conflict (Comment) Persecutory voices/beliefs?: No Depression: Yes Depression Symptoms: Isolating, Loss of interest in usual pleasures, Feeling worthless/self pity, Feeling angry/irritable Substance abuse history and/or treatment for substance abuse?: No Suicide prevention information given to non-admitted patients: Not applicable  Risk to Others within the past 6 months Homicidal Ideation: No Does patient have any lifetime risk of violence toward others beyond the six months prior to admission? : No Thoughts of Harm to Others: No Current Homicidal Intent: No Current Homicidal Plan: No Access to Homicidal Means: No Identified Victim: NA History of harm to others?: No Assessment of Violence: None Noted Violent Behavior Description: NA Does patient have access to weapons?: No Criminal Charges Pending?: No Does patient have a court date: No Is patient on probation?: No  Psychosis Hallucinations: None noted Delusions: None noted  Mental Status Report Appearance/Hygiene: Unremarkable Eye Contact: Fair Motor Activity: Freedom of movement Speech: Logical/coherent Level of Consciousness: Alert Mood: Depressed Affect: Depressed Anxiety Level: None Thought Processes: Coherent, Relevant Judgement: Unimpaired Orientation: Place, Person, Time, Situation, Appropriate for developmental age Obsessive Compulsive Thoughts/Behaviors: None  Cognitive Functioning Concentration: Normal Memory: Recent Intact, Remote Intact IQ: Average Insight: Fair Impulse Control: Fair Appetite: Fair Weight Loss: 0 Weight Gain: 0 Sleep: Decreased Total Hours of Sleep: 5 Vegetative Symptoms: None  ADLScreening [facility] Assessment Services) Patient's cognitive ability adequate to safely complete daily activities?: Yes Patient able to express need for assistance with ADLs?: Yes Independently performs ADLs?: Yes (appropriate for  developmental age)  Prior Inpatient Therapy Prior Inpatient Therapy: Yes Prior Therapy Dates: multiple  Prior Therapy Facilty/Provider(s): multiple Reason for Treatment: bipolar  Prior Outpatient Therapy Prior Outpatient Therapy: Yes Prior Therapy Dates: current Prior Therapy Facilty/Provider(s): [facility] Reason for Treatment: bipolar Does patient have an ACCT team?: Yes Does patient have Intensive In-House Services?  : No Does patient have [facility] services? : No Does patient have P4CC services?: No  ADL Screening (condition at time of admission) Patient's cognitive ability adequate to safely complete daily activities?: Yes Is the patient deaf or have difficulty hearing?: No Does the patient have difficulty seeing, even when wearing glasses/contacts?: No Does the patient have difficulty concentrating, remembering, or making decisions?: No Patient able to express need for assistance with ADLs?: Yes Does the patient have difficulty dressing or bathing?: No Independently performs ADLs?: Yes (appropriate for developmental age) Weakness of Legs: None Weakness of Arms/Hands: None       Abuse/Neglect Assessment (Assessment to be complete while patient is alone) Physical Abuse: Denies Verbal Abuse: Denies Sexual Abuse: Denies Exploitation of patient/patient's resources: Denies Self-Neglect: Denies     Regulatory affairs officer (For Healthcare) Does Patient Have a Medical Advance Directive?: No    Additional Information 1:1 In Past 12 Months?: No CIRT Risk: No Elopement Risk: No Does patient have medical clearance?: Yes     Disposition:  Disposition Initial Assessment Completed for this Encounter: Yes Disposition of Patient: Outpatient treatment Type of outpatient treatment: Adult  Kalina Morabito D 10/28/2016 9:45 AM

## 2016-10-28 NOTE — ED Notes (Signed)
Bed: WTR8 Expected date:  Expected time:  Means of arrival:  Comments: 

## 2016-10-28 NOTE — ED Notes (Signed)
Patient was unsuccessful at obtaining urine sample, will try again next time.

## 2016-10-28 NOTE — ED Notes (Signed)
Patient wanded belongings secured

## 2016-10-28 NOTE — BHH Suicide Risk Assessment (Signed)
Suicide Risk Assessment  Discharge Assessment   [facility] Discharge Suicide Risk Assessment   Principal Problem: Schizoaffective disorder, bipolar type [facility]) Discharge Diagnoses:  Patient Active Problem List   Diagnosis Date Noted  . Schizoaffective disorder, bipolar type ([place]) [F25.0] 12/12/2006    Priority: High  . Cannabis use disorder, moderate, dependence ([place]) [F12.20] 05/12/2015    Priority: Low  . No diagnosis on Axis I [Z03.89] 08/21/2015  . Abnormal behavior [R46.89]   . Involuntary commitment [Z04.6]   . Schizophrenia ([place]) [F20.9]   . Disturbance in affect ([place]) [F39]   . Tobacco use disorder [F17.200] 05/12/2015  . Hypertension [I10] 02/24/2011  . ANEMIA-IRON DEFICIENCY [D50.9] 12/13/2006  . GERD [K21.9] 12/13/2006  . OBESITY [E66.9] 12/12/2006  . HEMORRHOIDS, NOS [K64.9] 12/12/2006  . PAIN-NECK [M54.2] 12/12/2006  . TREMOR [R25.9] 12/12/2006    Total Time spent with patient: 45 minutes  Musculoskeletal: Strength & Muscle Tone: within normal limits Gait & Station: normal Patient leans: N/A  Psychiatric Specialty Exam:   There were no vitals taken for this visit.There is no height or weight on file to calculate BMI.  General Appearance: Casual  Eye Contact::  Good  Speech:  Normal Rate409  Volume:  Normal  Mood:  Depressed, mild  Affect:  Congruent  Thought Process:  Coherent and Descriptions of Associations: Intact  Orientation:  Full (Time, Place, and Person)  Thought Content:  WDL and Logical  Suicidal Thoughts:  No  Homicidal Thoughts:  No  Memory:  Immediate;   Good Recent;   Good Remote;   Good  Judgement:  Fair  Insight:  Fair  Psychomotor Activity:  Normal  Concentration:  Good  Recall:  Good  Fund of Knowledge:Good  Language: Good  Akathisia:  No  Handed:  Right  AIMS (if indicated):     Assets:  Housing Leisure Time Physical Health Resilience Social Support  Sleep:     Cognition: WNL  ADL's:  Intact   Mental Status Per Nursing  Assessment::   On Admission:   Passive suicidal ideations, intermittent.  None when assessed later by the psychiatrist.  Her ACT team, [facility], were called and will come get her, stable for discharge.    Demographic Factors:  Living alone  Loss Factors: NA  Historical Factors: NA  Risk Reduction Factors:   Sense of responsibility to family, Positive social support and Positive therapeutic relationship  Continued Clinical Symptoms:  Depression, mild  Cognitive Features That Contribute To Risk:  None    Suicide Risk:  Minimal: No identifiable suicidal ideation.  Patients presenting with no risk factors but with morbid ruminations; may be classified as minimal risk based on the severity of the depressive symptoms    Plan Of Care/Follow-up recommendations:  Activity:  as tolerated  Diet:  heart healthy diet  Eastyn Dattilo, NP 10/28/2016, 11:34 AM

## 2016-11-18 ENCOUNTER — Other Ambulatory Visit: Payer: Self-pay | Admitting: Family Medicine

## 2016-11-18 ENCOUNTER — Other Ambulatory Visit: Payer: Self-pay

## 2016-11-18 DIAGNOSIS — Z1231 Encounter for screening mammogram for malignant neoplasm of breast: Secondary | ICD-10-CM

## 2016-11-25 ENCOUNTER — Ambulatory Visit: Payer: Self-pay

## 2016-12-02 ENCOUNTER — Ambulatory Visit: Payer: Self-pay

## 2017-01-13 ENCOUNTER — Emergency Department (HOSPITAL_COMMUNITY)
Admission: EM | Admit: 2017-01-13 | Discharge: 2017-01-13 | Disposition: A | Discharge status: Home / Self Care | Payer: Medicaid Other | Attending: Emergency Medicine | Admitting: Emergency Medicine

## 2017-01-13 ENCOUNTER — Encounter (HOSPITAL_COMMUNITY): Payer: Self-pay | Admitting: Emergency Medicine

## 2017-01-13 DIAGNOSIS — H9202 Otalgia, left ear: Secondary | ICD-10-CM | POA: Insufficient documentation

## 2017-01-13 DIAGNOSIS — Z5321 Procedure and treatment not carried out due to patient leaving prior to being seen by health care provider: Secondary | ICD-10-CM | POA: Insufficient documentation

## 2017-01-13 NOTE — ED Triage Notes (Signed)
Per [facility] patient from home for left ear pain and shaking that started last night. Patient having trouble hearing out of it.

## 2017-01-13 NOTE — ED Notes (Signed)
Patient not in lobby

## 2017-01-13 NOTE — ED Notes (Signed)
Patient came in open doors from lobby stating that she wanted to leave and needed a bus ticket. Informed patient that she wasnt able to get a bus ticket until after she was seen and discharged by EDP.  Patient repeated that she didn't want to wait any longer.  Informed patient again that she could not get a bus ticket until after she was seen by EDP.   Then off Duty [employer] came through door and escorted patient back out to lobby.

## 2017-01-26 ENCOUNTER — Emergency Department (HOSPITAL_COMMUNITY)
Admission: EM | Admit: 2017-01-26 | Discharge: 2017-01-28 | Disposition: A | Discharge status: Other Acute Inpatient Hospital | Payer: Medicaid Other | Attending: Emergency Medicine | Admitting: Emergency Medicine

## 2017-01-26 ENCOUNTER — Encounter (HOSPITAL_COMMUNITY): Payer: Self-pay | Admitting: Emergency Medicine

## 2017-01-26 DIAGNOSIS — R440 Auditory hallucinations: Secondary | ICD-10-CM | POA: Diagnosis not present

## 2017-01-26 DIAGNOSIS — I10 Essential (primary) hypertension: Secondary | ICD-10-CM | POA: Diagnosis not present

## 2017-01-26 DIAGNOSIS — F39 Unspecified mood [affective] disorder: Secondary | ICD-10-CM | POA: Diagnosis not present

## 2017-01-26 DIAGNOSIS — F17210 Nicotine dependence, cigarettes, uncomplicated: Secondary | ICD-10-CM | POA: Diagnosis not present

## 2017-01-26 DIAGNOSIS — R443 Hallucinations, unspecified: Secondary | ICD-10-CM

## 2017-01-26 DIAGNOSIS — Z79899 Other long term (current) drug therapy: Secondary | ICD-10-CM | POA: Diagnosis not present

## 2017-01-26 DIAGNOSIS — F209 Schizophrenia, unspecified: Secondary | ICD-10-CM

## 2017-01-26 DIAGNOSIS — F250 Schizoaffective disorder, bipolar type: Secondary | ICD-10-CM | POA: Diagnosis not present

## 2017-01-26 DIAGNOSIS — E876 Hypokalemia: Secondary | ICD-10-CM

## 2017-01-26 DIAGNOSIS — R45851 Suicidal ideations: Secondary | ICD-10-CM | POA: Insufficient documentation

## 2017-01-26 LAB — COMPREHENSIVE METABOLIC PANEL
ALT: 8 U/L — ABNORMAL LOW (ref 14–54)
AST: 15 U/L (ref 15–41)
Albumin: 3.8 g/dL (ref 3.5–5.0)
Alkaline Phosphatase: 71 U/L (ref 38–126)
Anion gap: 9 (ref 5–15)
CO2: 25 mmol/L (ref 22–32)
Calcium: 8.8 mg/dL — ABNORMAL LOW (ref 8.9–10.3)
Chloride: 104 mmol/L (ref 101–111)
Creatinine, Ser: 0.92 mg/dL (ref 0.44–1.00)
GFR calc Af Amer: >60 mL/min (ref >60)
GFR calc non Af Amer: >60 mL/min (ref >60)
GLUCOSE: 148 mg/dL — AB (ref 65–99)
POTASSIUM: 2.8 mmol/L — AB (ref 3.5–5.1)
Sodium: 138 mmol/L (ref 135–145)
Total Bilirubin: 0.2 mg/dL — ABNORMAL LOW (ref 0.3–1.2)
Total Protein: 7.8 g/dL (ref 6.5–8.1)

## 2017-01-26 LAB — CBC
HCT: 27.2 % — ABNORMAL LOW (ref 36.0–46.0)
Hemoglobin: 8.6 g/dL — ABNORMAL LOW (ref 12.0–15.0)
MCH: 23.5 pg — ABNORMAL LOW (ref 26.0–34.0)
MCHC: 31.6 g/dL (ref 30.0–36.0)
MCV: 74.3 fL — AB (ref 78.0–100.0)
PLATELETS: 278 10*3/uL (ref 150–400)
RBC: 3.66 MIL/uL — AB (ref 3.87–5.11)
RDW: 19.2 % — ABNORMAL HIGH (ref 11.5–15.5)
WBC: 7.7 10*3/uL (ref 4.0–10.5)

## 2017-01-26 LAB — RAPID URINE DRUG SCREEN, HOSP PERFORMED
AMPHETAMINES: NOT DETECTED
BENZODIAZEPINES: NOT DETECTED
Barbiturates: NOT DETECTED
Cocaine: NOT DETECTED
Opiates: NOT DETECTED
Tetrahydrocannabinol: NOT DETECTED

## 2017-01-26 LAB — SALICYLATE LEVEL: Salicylate Lvl: <7 mg/dL (ref 2.8–30.0)

## 2017-01-26 LAB — ETHANOL: Alcohol, Ethyl (B): <10 mg/dL (ref <10)

## 2017-01-26 LAB — ACETAMINOPHEN LEVEL: Acetaminophen (Tylenol), Serum: <10 ug/mL — ABNORMAL LOW (ref 10–30)

## 2017-01-26 MED ORDER — OLANZAPINE 5 MG PO TABS
5.0000 mg | ORAL_TABLET | Freq: Two times a day (BID) | ORAL | Status: DC
  Administered 2017-01-26 – 2017-01-27 (×3): 5 mg via ORAL
  Filled 2017-01-26 (×3): qty 1

## 2017-01-26 MED ORDER — POTASSIUM CHLORIDE CRYS ER 20 MEQ PO TBCR
40.0000 meq | EXTENDED_RELEASE_TABLET | Freq: Once | ORAL | Status: AC
  Administered 2017-01-26: 40 meq via ORAL
  Filled 2017-01-26: qty 2

## 2017-01-26 MED ORDER — POTASSIUM CHLORIDE CRYS ER 20 MEQ PO TBCR
20.0000 meq | EXTENDED_RELEASE_TABLET | Freq: Two times a day (BID) | ORAL | Status: DC
  Administered 2017-01-26 – 2017-01-28 (×6): 20 meq via ORAL
  Filled 2017-01-26 (×5): qty 1

## 2017-01-26 MED ORDER — TRAZODONE HCL 50 MG PO TABS
50.0000 mg | ORAL_TABLET | Freq: Every day | ORAL | Status: DC
  Administered 2017-01-26: 50 mg via ORAL
  Filled 2017-01-26: qty 1

## 2017-01-26 NOTE — BHH Counselor (Signed)
Clinician attempted to contact EDP to discuss pt's disposition. Clinician will follow up with EDP.    Vertell Novak, MS, [facility], [facility] Triage Specialist [phone]

## 2017-01-26 NOTE — BH Assessment (Signed)
[place] Assessment Progress Note  Per Corena Pilgrim, MD, this pt requires psychiatric hospitalization at this time.  The following facilities have been contacted to seek placement for this pt, with results as noted:  Beds available, information sent, decision pending:  [facility].   Jalene Mullet, [place] Triage Specialist [phone]
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
## 2017-01-26 NOTE — ED Notes (Signed)
Pt presents with SI, no plan and auditory hallucinations.  Pt admits to being off meds for past few days.  Denies HI or visual hallucinations.  A&O x 3, no distress noted, calm & cooperative.  Monitoring for safety, Q 15 min checks in effect.  Safety check for contraband completed, no items found.

## 2017-01-26 NOTE — ED Provider Notes (Signed)
[facility] DEPT Provider Note   CSN: [id number] Arrival date & time: 01/26/17  0202     History   Chief Complaint Chief Complaint  Patient presents with  . Suicidal    HPI [name] is a 46 y.o. female.  HPI  This is a 46 year old female with a history of bipolar disorder, schizophrenia who presents with hallucinations and suicidal ideation.  Patient reports that she does not think that her medications are working. She reports hearing voices" that are going to kill me." When asked who "they" are patient states "the devil." She denies ever trying to harm herself in the past. She denies any alcohol or drug use. She reports "pain all over."  Past Medical History:  Diagnosis Date  . Arthritis   . Bipolar 1 disorder ([place])   . Bronchitis   . Depression   . Hemorrhoid   . Schizoaffective disorder ([place])   . Schizophrenia ([place])    Pt denies and reports it is schizoaffective disorder.   . Transfusion history     Patient Active Problem List   Diagnosis Date Noted  . No diagnosis on Axis I 08/21/2015  . Abnormal behavior   . Involuntary commitment   . Schizophrenia ([place])   . Disturbance in affect   . Tobacco use disorder 05/12/2015  . Cannabis use disorder, moderate, dependence ([place]) 05/12/2015  . Hypertension 02/24/2011  . ANEMIA-IRON DEFICIENCY 12/13/2006  . GERD 12/13/2006  . OBESITY 12/12/2006  . Schizoaffective disorder, bipolar type ([place]) 12/12/2006  . HEMORRHOIDS, NOS 12/12/2006  . PAIN-NECK 12/12/2006  . TREMOR 12/12/2006    Past Surgical History:  Procedure Laterality Date  . CESAREAN SECTION      OB History    No data available       Home Medications    Prior to Admission medications   Medication Sig Start Date End Date Taking? Authorizing Provider  INGREZZA 80 MG CAPS Take 1 capsule by mouth daily. 11/15/16  Yes [provider]  haloperidol (HALDOL) 5 MG tablet Take 1 tablet (5 mg total) by mouth 2 (two) times daily. Patient  not taking: Reported on 11/09/2015 08/14/15   Delfin Gant, NP    Family History Family History  Problem Relation Age of Onset  . Diabetes type II Other     Social History Social History  Substance Use Topics  . Smoking status: Current Every Day Smoker    Packs/day: 0.50    Years: 17.00    Types: Cigarettes  . Smokeless tobacco: Never Used  . Alcohol use No     Allergies   Latuda [lurasidone hcl]; Abilify [aripiprazole]; Flagyl [metronidazole hcl]; Prolixin [fluphenazine]; Risperidone and related; Seroquel [quetiapine fumarate]; and Sulfa antibiotics   Review of Systems Review of Systems  Constitutional: Negative for fever.  Respiratory: Negative for shortness of breath.   Cardiovascular: Negative for chest pain.  Gastrointestinal: Negative for abdominal pain.  Psychiatric/Behavioral: Positive for hallucinations and suicidal ideas. Negative for self-injury.  All other systems reviewed and are negative.    Physical Exam Updated Vital Signs BP (!) 117/101 (BP Location: Right Arm)   Pulse 87   Temp 98.7 F (37.1 C) (Oral)   Resp 18   Ht 5\' 4"  (1.626 m)   Wt 110.7 kg (244 lb)   LMP 01/03/2017   SpO2 100%   BMI 41.88 kg/m   Physical Exam  Constitutional: She is oriented to person, place, and time.  Obese, anxious appearing, no acute distress  HENT:  Head:  Normocephalic and atraumatic.  Cardiovascular: Normal rate, regular rhythm and normal heart sounds.   Pulmonary/Chest: Effort normal and breath sounds normal. No respiratory distress. She has no wheezes.  Abdominal: Soft. There is no tenderness.  Neurological: She is alert and oriented to person, place, and time.  Skin: Skin is warm and dry.  Psychiatric:  Anxious appearing and pacing  Nursing note and vitals reviewed.    ED Treatments / Results  Labs (all labs ordered are listed, but only abnormal results are displayed) Labs Reviewed  COMPREHENSIVE METABOLIC PANEL - Abnormal; Notable for the  following:       Result Value   Potassium 2.8 (*)    Glucose, Bld 148 (*)    BUN <5 (*)    Calcium 8.8 (*)    ALT 8 (*)    Total Bilirubin 0.2 (*)    All other components within normal limits  ACETAMINOPHEN LEVEL - Abnormal; Notable for the following:    Acetaminophen (Tylenol), Serum <10 (*)    All other components within normal limits  CBC - Abnormal; Notable for the following:    RBC 3.66 (*)    Hemoglobin 8.6 (*)    HCT 27.2 (*)    MCV 74.3 (*)    MCH 23.5 (*)    RDW 19.2 (*)    All other components within normal limits  ETHANOL  SALICYLATE LEVEL  RAPID URINE DRUG SCREEN, HOSP PERFORMED    EKG  EKG Interpretation None       Radiology No results found.  Procedures Procedures (including critical care time)  Medications Ordered in ED Medications  potassium chloride SA (K-DUR,KLOR-CON) CR tablet 40 mEq (not administered)  potassium chloride SA (K-DUR,KLOR-CON) CR tablet 20 mEq (not administered)     Initial Impression / Assessment and Plan / ED Course  I have reviewed the triage vital signs and the nursing notes.  Pertinent labs & imaging results that were available during my care of the patient were reviewed by me and considered in my medical decision making (see chart for details).     Patient presents with suicidal ideation. Also reports hallucinations. She is otherwise nontoxic on exam and vital signs are reassuring. Workup is only notable for mild hypokalemia. Patient was given 40 mEq of potassium and started on potassium twice daily. Will have TTS evaluate.  Final Clinical Impressions(s) / ED Diagnoses   Final diagnoses:  Suicidal ideation  Hallucinations  Hypokalemia    New Prescriptions New Prescriptions   No medications on file     Merryl Hacker, MD 01/26/17 [phone]

## 2017-01-26 NOTE — BHH Counselor (Signed)
Clinician discussed disposition (inpatient treatment, TTS to seek placement) with Dr. Dina Rich.    Vertell Novak, MS, [facility], [facility] Triage Specialist [phone]

## 2017-01-26 NOTE — ED Triage Notes (Signed)
Patient is wanting to kill herself. Patient does not have a plan. She states she is hearing voices. Patient has not had her medication in a few days.

## 2017-01-27 DIAGNOSIS — F17210 Nicotine dependence, cigarettes, uncomplicated: Secondary | ICD-10-CM

## 2017-01-27 DIAGNOSIS — F250 Schizoaffective disorder, bipolar type: Secondary | ICD-10-CM

## 2017-01-27 DIAGNOSIS — F39 Unspecified mood [affective] disorder: Secondary | ICD-10-CM | POA: Diagnosis not present

## 2017-01-27 MED ORDER — DIPHENHYDRAMINE HCL 50 MG/ML IJ SOLN
25.0000 mg | Freq: Once | INTRAMUSCULAR | Status: AC
  Administered 2017-01-27: 25 mg via INTRAMUSCULAR
  Filled 2017-01-27: qty 1

## 2017-01-27 MED ORDER — HALOPERIDOL 2 MG PO TABS
2.0000 mg | ORAL_TABLET | Freq: Two times a day (BID) | ORAL | Status: DC
  Administered 2017-01-27 – 2017-01-28 (×3): 2 mg via ORAL
  Filled 2017-01-27 (×3): qty 1

## 2017-01-27 MED ORDER — BENZTROPINE MESYLATE 1 MG PO TABS
1.0000 mg | ORAL_TABLET | Freq: Two times a day (BID) | ORAL | Status: DC
Start: 2017-01-27 — End: 2017-01-28
  Administered 2017-01-27 – 2017-01-28 (×3): 1 mg via ORAL
  Filled 2017-01-27 (×3): qty 1

## 2017-01-27 MED ORDER — LORAZEPAM 2 MG/ML IJ SOLN
2.0000 mg | Freq: Once | INTRAMUSCULAR | Status: AC
  Administered 2017-01-27: 2 mg via INTRAMUSCULAR
  Filled 2017-01-27: qty 1

## 2017-01-27 MED ORDER — TRAZODONE HCL 100 MG PO TABS
100.0000 mg | ORAL_TABLET | Freq: Every day | ORAL | Status: DC
Start: 2017-01-27 — End: 2017-01-28
  Administered 2017-01-27: 100 mg via ORAL
  Filled 2017-01-27: qty 1

## 2017-01-27 MED ORDER — HALOPERIDOL 5 MG PO TABS: 5.0000 mg | ORAL_TABLET | Freq: Two times a day (BID) | ORAL | Status: DC

## 2017-01-27 MED ORDER — HALOPERIDOL DECANOATE 100 MG/ML IM SOLN
50.0000 mg | INTRAMUSCULAR | Status: DC
  Administered 2017-01-27: 50 mg via INTRAMUSCULAR
  Filled 2017-01-27: qty 0.5

## 2017-01-27 MED ORDER — HYDROXYZINE HCL 25 MG PO TABS: 25.0000 mg | ORAL_TABLET | Freq: Three times a day (TID) | ORAL | Status: DC | PRN

## 2017-01-27 NOTE — BH Assessment (Signed)
Writer spoke with [facility] Otila Kluver) about patient

## 2017-01-27 NOTE — Progress Notes (Signed)
01/27/17 1345:  LRT went to pt room to offer activities, pt declined.  [name], LRT/CTRS

## 2017-01-27 NOTE — Consult Note (Signed)
[facility] Consult   Reason for Consult:  Auditory Hallucinations Referring Physician:  EDP Patient Identification: [name] MRN:  [id number] Principal Diagnosis: Schizoaffective disorder, bipolar type [facility]) Diagnosis:   Patient Active Problem List   Diagnosis Date Noted  . No diagnosis on Axis I [Z03.89] 08/21/2015  . Abnormal behavior [R46.89]   . Involuntary commitment [Z04.6]   . Schizophrenia ([place]) [F20.9]   . Disturbance in affect [R45.89]   . Tobacco use disorder [F17.200] 05/12/2015  . Cannabis use disorder, moderate, dependence ([place]) [F12.20] 05/12/2015  . Hypertension [I10] 02/24/2011  . ANEMIA-IRON DEFICIENCY [D50.9] 12/13/2006  . GERD [K21.9] 12/13/2006  . OBESITY [E66.9] 12/12/2006  . Schizoaffective disorder, bipolar type ([place]) [F25.0] 12/12/2006  . HEMORRHOIDS, NOS [K64.9] 12/12/2006  . PAIN-NECK [M54.2] 12/12/2006  . TREMOR [R25.9] 12/12/2006    Total Time spent with patient: 45 minutes  Subjective:   [name] is a 46 y.o. female patient admitted with hearing voices telling her they are going to kill her.   HPI:  Pt was seen and chart reviewed with treatment team and Dr [name]. Pt denies suicidal/homicidal ideation, denies visual hallucinations and does not appear to be responding to internal stimuli. Pt endorses auditory hallucinations. Pt states "I hear the devil and he is telling me to forget to take my medications and the voices say they are going to kill me. Pt's UDS and BAL negative on admission. Pt would benefit from inpatient psychiatric admission for medication management and crisis stabilization.   Past Psychiatric History: As above  Risk to Self: None Risk to Others: None Prior Inpatient Therapy: Prior Inpatient Therapy: Yes Prior Therapy Dates: UTA Prior Therapy Facilty/Provider(s): [facility] Reason for Treatment: UTa Prior Outpatient Therapy: Prior Outpatient Therapy: Yes Prior Therapy Dates: Current Prior  Therapy Facilty/Provider(s): [facility] ACTT Team. Dr. Wallie Char Reason for Treatment: Medication management and counseling.  Does patient have an ACCT team?: Yes Does patient have Intensive In-House Services?  : No Does patient have [facility] services? : No Does patient have P4CC services?: No  Past Medical History:  Past Medical History:  Diagnosis Date  . Arthritis   . Bipolar 1 disorder ([place])   . Bronchitis   . Depression   . Hemorrhoid   . Schizoaffective disorder ([place])   . Schizophrenia ([place])    Pt denies and reports it is schizoaffective disorder.   . Transfusion history     Past Surgical History:  Procedure Laterality Date  . CESAREAN SECTION     Family History:  Family History  Problem Relation Age of Onset  . Diabetes type II Other    Family Psychiatric  History: Unknown Social History:  History  Alcohol Use No     History  Drug Use No    Comment: Former user    Social History   Social History  . Marital status: Legally Separated    Spouse name: N/A  . Number of children: N/A  . Years of education: N/A   Social History Main Topics  . Smoking status: Current Every Day Smoker    Packs/day: 0.50    Years: 17.00    Types: Cigarettes  . Smokeless tobacco: Never Used  . Alcohol use No  . Drug use: No     Comment: Former user  . Sexual activity: No   Other Topics Concern  . None   Social History Narrative   ** Merged History Encounter **       Additional Social History:  Allergies:   Allergies  Allergen Reactions  . Anette Guarneri [Lurasidone Hcl] Nausea And Vomiting and Other (See Comments)    Reaction:  Shaking   . Abilify [Aripiprazole] Other (See Comments)    shaking  . Flagyl [Metronidazole Hcl] Nausea And Vomiting  . Prolixin [Fluphenazine] Other (See Comments)    shaking  . Risperidone And Related Other (See Comments)    shaking  . Seroquel [Quetiapine Fumarate]     Patient states she had a syncopal episode after taking  medication.   . Sulfa Antibiotics Nausea And Vomiting    Labs:  Results for orders placed or performed during the hospital encounter of 01/26/17 (from the past 48 hour(s))  Rapid urine drug screen (hospital performed)     Status: None   Collection Time: 01/26/17  2:08 AM  Result Value Ref Range   Opiates NONE DETECTED NONE DETECTED   Cocaine NONE DETECTED NONE DETECTED   Benzodiazepines NONE DETECTED NONE DETECTED   Amphetamines NONE DETECTED NONE DETECTED   Tetrahydrocannabinol NONE DETECTED NONE DETECTED   Barbiturates NONE DETECTED NONE DETECTED    Comment:        DRUG SCREEN FOR MEDICAL PURPOSES ONLY.  IF CONFIRMATION IS NEEDED FOR ANY PURPOSE, NOTIFY LAB WITHIN 5 DAYS.        LOWEST DETECTABLE LIMITS FOR URINE DRUG SCREEN Drug Class       Cutoff (ng/mL) Amphetamine      1000 Barbiturate      200 Benzodiazepine   627 Tricyclics       035 Opiates          300 Cocaine          300 THC              50   Comprehensive metabolic panel     Status: Abnormal   Collection Time: 01/26/17  2:20 AM  Result Value Ref Range   Sodium 138 135 - 145 mmol/L   Potassium 2.8 (L) 3.5 - 5.1 mmol/L   Chloride 104 101 - 111 mmol/L   CO2 25 22 - 32 mmol/L   Glucose, Bld 148 (H) 65 - 99 mg/dL   BUN <5 (L) 6 - 20 mg/dL   Creatinine, Ser 0.92 0.44 - 1.00 mg/dL   Calcium 8.8 (L) 8.9 - 10.3 mg/dL   Total Protein 7.8 6.5 - 8.1 g/dL   Albumin 3.8 3.5 - 5.0 g/dL   AST 15 15 - 41 U/L   ALT 8 (L) 14 - 54 U/L   Alkaline Phosphatase 71 38 - 126 U/L   Total Bilirubin 0.2 (L) 0.3 - 1.2 mg/dL   GFR calc non Af Amer >60 >60 mL/min   GFR calc Af Amer >60 >60 mL/min    Comment: (NOTE) The eGFR has been calculated using the CKD EPI equation. This calculation has not been validated in all clinical situations. eGFR's persistently <60 mL/min signify possible Chronic Kidney Disease.    Anion gap 9 5 - 15  Ethanol     Status: None   Collection Time: 01/26/17  2:20 AM  Result Value Ref Range    Alcohol, Ethyl (B) <10 <10 mg/dL    Comment:        LOWEST DETECTABLE LIMIT FOR SERUM ALCOHOL IS 10 mg/dL FOR MEDICAL PURPOSES ONLY Please note change in reference range.   Salicylate level     Status: None   Collection Time: 01/26/17  2:20 AM  Result Value Ref Range   Salicylate Lvl <0.0  2.8 - 30.0 mg/dL  Acetaminophen level     Status: Abnormal   Collection Time: 01/26/17  2:20 AM  Result Value Ref Range   Acetaminophen (Tylenol), Serum <10 (L) 10 - 30 ug/mL    Comment:        THERAPEUTIC CONCENTRATIONS VARY SIGNIFICANTLY. A RANGE OF 10-30 ug/mL MAY BE AN EFFECTIVE CONCENTRATION FOR MANY PATIENTS. HOWEVER, SOME ARE BEST TREATED AT CONCENTRATIONS OUTSIDE THIS RANGE. ACETAMINOPHEN CONCENTRATIONS >150 ug/mL AT 4 HOURS AFTER INGESTION AND >50 ug/mL AT 12 HOURS AFTER INGESTION ARE OFTEN ASSOCIATED WITH TOXIC REACTIONS.   cbc     Status: Abnormal   Collection Time: 01/26/17  2:20 AM  Result Value Ref Range   WBC 7.7 4.0 - 10.5 K/uL   RBC 3.66 (L) 3.87 - 5.11 MIL/uL   Hemoglobin 8.6 (L) 12.0 - 15.0 g/dL   HCT 27.2 (L) 36.0 - 46.0 %   MCV 74.3 (L) 78.0 - 100.0 fL   MCH 23.5 (L) 26.0 - 34.0 pg   MCHC 31.6 30.0 - 36.0 g/dL   RDW 19.2 (H) 11.5 - 15.5 %   Platelets 278 150 - 400 K/uL    Comment: REPEATED TO VERIFY    Current Facility-Administered Medications  Medication Dose Route Frequency Provider Last Rate Last Dose  . benztropine (COGENTIN) tablet 1 mg  1 mg Oral BID Theador Jezewski, MD      . diphenhydrAMINE (BENADRYL) injection 25 mg  25 mg Intramuscular Once Erdem Naas, MD      . haloperidol (HALDOL) tablet 2 mg  2 mg Oral BID Nirvi Boehler, MD      . haloperidol decanoate (HALDOL DECANOATE) 100 MG/ML injection 50 mg  50 mg Intramuscular Q30 days Christalyn Goertz, MD      . hydrOXYzine (ATARAX/VISTARIL) tablet 25 mg  25 mg Oral TID PRN Shalik Sanfilippo, MD      . LORazepam (ATIVAN) injection 2 mg  2 mg Intramuscular Once Chavonne Sforza, MD      .  potassium chloride SA (K-DUR,KLOR-CON) CR tablet 20 mEq  20 mEq Oral BID Dina Rich, Barbette Hair, MD   20 mEq at 01/27/17 1046  . traZODone (DESYREL) tablet 100 mg  100 mg Oral QHS Corena Pilgrim, MD       Current Outpatient Prescriptions  Medication Sig Dispense Refill  . INGREZZA 80 MG CAPS Take 1 capsule by mouth daily.  11  . haloperidol (HALDOL) 5 MG tablet Take 1 tablet (5 mg total) by mouth 2 (two) times daily. (Patient not taking: Reported on 11/09/2015) 60 tablet 0    Musculoskeletal: Strength & Muscle Tone: within normal limits Gait & Station: normal Patient leans: N/A  Psychiatric Specialty Exam: Physical Exam  Constitutional: She appears well-developed and well-nourished.  HENT:  Head: Normocephalic.  Respiratory: Effort normal.  Musculoskeletal: Normal range of motion.  Neurological: She is alert.    Review of Systems  Psychiatric/Behavioral: Positive for depression and hallucinations (auditory). Negative for memory loss, substance abuse and suicidal ideas. The patient is not nervous/anxious and does not have insomnia.   All other systems reviewed and are negative.   Blood pressure 126/69, pulse (!) 58, temperature 98.8 F (37.1 C), temperature source Oral, resp. rate 16, height _0  (1.626 m), weight 110.7 kg (244 lb), last menstrual period 01/03/2017, SpO2 98 %.Body mass index is 41.88 kg/m.  General Appearance: Casual  Eye Contact:  Fair  Speech:  Slow  Volume:  Decreased  Mood:  Depressed and Dysphoric  Affect:  Congruent,  Depressed and Flat  Thought Process:  Coherent  Orientation:  Full (Time, Place, and Person)  Thought Content:  Hallucinations: Auditory  Suicidal Thoughts:  No  Homicidal Thoughts:  No  Memory:  Immediate;   Good Recent;   Fair Remote;   Fair  Judgement:  Fair  Insight:  Fair  Psychomotor Activity:  Decreased  Concentration:  Concentration: Fair and Attention Span: Fair  Recall:  [place] of Knowledge:  Good  Language:  Good   Akathisia:  No  Handed:  Right  AIMS (if indicated):     Assets:  Agricultural consultant Housing Resilience Social Support  ADL's:  Intact  Cognition:  WNL  Sleep:        Treatment Plan Summary: Daily contact with patient to assess and evaluate symptoms and progress in treatment and Medication management  -Crisis Stabilization Continue these medications: -Cogentin 1 mg BID for EPS -Haldol 2 mg BID for mood stabilization -Haloperidol decanoate 183m/ml injection every thirty days for mood stabilization  Disposition: Recommend psychiatric Inpatient admission when medically cleared. [facility]  Patient seen face-to-face for psychiatric evaluation, chart reviewed and case discussed with the physician extender and developed treatment plan. Reviewed the information documented and agree with the treatment plan. MCorena Pilgrim MD

## 2017-01-27 NOTE — BH Assessment (Signed)
Patient has been accepted to [facility].  Accepting physician is Dr. Bary Leriche.  Attending Physician will be Dr. Bary Leriche.  Patient has been assigned to room 307, by [name].  Call report to [phone].  Representative/Transfer Coordinator is Makaley Storts Patient pre-admitted by [facility] Patient Access Vonna Kotyk)  [facility] ER Staff Marlou Porch, TTS) made aware of acceptance.

## 2017-01-27 NOTE — ED Notes (Signed)
Pt sleeping at present, no distress noted, calm & cooperative.  Monitoring for safety, Q 15 min checks in effect. 

## 2017-01-27 NOTE — ED Notes (Signed)
Patient has been resting in the room for majority of the shift.  Patient received haldol deconate without incident.

## 2017-01-27 NOTE — BH Assessment (Signed)
[facility] Assessment Progress Note  Per Corena Pilgrim, MD, this pt requires psychiatric hospitalization.  Ian Malkin reports that pt has been accepted to [facility] by Dr Bary Leriche to Rm 307.  Pt has signed consent forms, which have been faxed to [facility].  Pt's nurse, Edd Arbour, has been notified, and agrees to call report to [phone] and to send original forms along with pt.  Pt is to be transported via [place], [place] Triage [facility] Citron

## 2017-01-27 NOTE — BH Assessment (Signed)
Per TTS staff Kerry Dory) from [facility] patient accepted to the adult unit. Pending accepting doctor and room assignment. Support paperwork completed and faxed to [facility].

## 2017-01-28 ENCOUNTER — Other Ambulatory Visit: Payer: Self-pay

## 2017-01-28 ENCOUNTER — Inpatient Hospital Stay
Admission: AD | Admit: 2017-01-28 | Discharge: 2017-02-04 | DRG: 885 | Disposition: A | Discharge status: Home / Self Care | Payer: Medicaid Other | Source: Intra-hospital | Attending: Psychiatry | Admitting: Psychiatry

## 2017-01-28 DIAGNOSIS — Z23 Encounter for immunization: Secondary | ICD-10-CM | POA: Diagnosis not present

## 2017-01-28 DIAGNOSIS — K219 Gastro-esophageal reflux disease without esophagitis: Secondary | ICD-10-CM | POA: Diagnosis present

## 2017-01-28 DIAGNOSIS — Z881 Allergy status to other antibiotic agents status: Secondary | ICD-10-CM

## 2017-01-28 DIAGNOSIS — I10 Essential (primary) hypertension: Secondary | ICD-10-CM | POA: Diagnosis present

## 2017-01-28 DIAGNOSIS — E669 Obesity, unspecified: Secondary | ICD-10-CM | POA: Diagnosis present

## 2017-01-28 DIAGNOSIS — Z882 Allergy status to sulfonamides status: Secondary | ICD-10-CM

## 2017-01-28 DIAGNOSIS — F410 Panic disorder [episodic paroxysmal anxiety] without agoraphobia: Secondary | ICD-10-CM | POA: Diagnosis present

## 2017-01-28 DIAGNOSIS — Z6841 Body Mass Index (BMI) 40.0 and over, adult: Secondary | ICD-10-CM | POA: Diagnosis not present

## 2017-01-28 DIAGNOSIS — R440 Auditory hallucinations: Secondary | ICD-10-CM | POA: Diagnosis not present

## 2017-01-28 DIAGNOSIS — F17210 Nicotine dependence, cigarettes, uncomplicated: Secondary | ICD-10-CM | POA: Diagnosis present

## 2017-01-28 DIAGNOSIS — F250 Schizoaffective disorder, bipolar type: Principal | ICD-10-CM | POA: Diagnosis present

## 2017-01-28 DIAGNOSIS — Z79899 Other long term (current) drug therapy: Secondary | ICD-10-CM

## 2017-01-28 DIAGNOSIS — Z888 Allergy status to other drugs, medicaments and biological substances status: Secondary | ICD-10-CM | POA: Diagnosis not present

## 2017-01-28 DIAGNOSIS — M1990 Unspecified osteoarthritis, unspecified site: Secondary | ICD-10-CM | POA: Diagnosis present

## 2017-01-28 DIAGNOSIS — F17200 Nicotine dependence, unspecified, uncomplicated: Secondary | ICD-10-CM | POA: Diagnosis present

## 2017-01-28 LAB — PREGNANCY, URINE: Preg Test, Ur: NEGATIVE

## 2017-01-28 MED ORDER — TRAZODONE HCL 100 MG PO TABS
100.0000 mg | ORAL_TABLET | Freq: Every day | ORAL | Status: DC
  Administered 2017-01-28 – 2017-02-03 (×7): 100 mg via ORAL
  Filled 2017-01-28 (×7): qty 1

## 2017-01-28 MED ORDER — TRAZODONE HCL 100 MG PO TABS: 100.0000 mg | ORAL_TABLET | Freq: Every evening | ORAL | Status: DC | PRN

## 2017-01-28 MED ORDER — NICOTINE 21 MG/24HR TD PT24
21.0000 mg | MEDICATED_PATCH | Freq: Every day | TRANSDERMAL | Status: DC
  Administered 2017-01-29 – 2017-02-03 (×5): 21 mg via TRANSDERMAL
  Filled 2017-01-28 (×7): qty 1

## 2017-01-28 MED ORDER — OXCARBAZEPINE 300 MG PO TABS
300.0000 mg | ORAL_TABLET | Freq: Two times a day (BID) | ORAL | Status: DC
  Administered 2017-01-28 – 2017-02-03 (×12): 300 mg via ORAL
  Filled 2017-01-28 (×12): qty 1

## 2017-01-28 MED ORDER — INFLUENZA VAC SPLIT QUAD 0.5 ML IM SUSY
0.5000 mL | PREFILLED_SYRINGE | INTRAMUSCULAR | Status: AC
  Administered 2017-01-29: 0.5 mL via INTRAMUSCULAR

## 2017-01-28 MED ORDER — ILOPERIDONE 4 MG PO TABS
8.0000 mg | ORAL_TABLET | Freq: Two times a day (BID) | ORAL | Status: DC
  Administered 2017-01-28 – 2017-02-01 (×8): 8 mg via ORAL
  Filled 2017-01-28 (×10): qty 2

## 2017-01-28 MED ORDER — AMANTADINE HCL 100 MG PO CAPS
100.0000 mg | ORAL_CAPSULE | Freq: Two times a day (BID) | ORAL | Status: DC
  Administered 2017-01-28 – 2017-02-04 (×14): 100 mg via ORAL
  Filled 2017-01-28 (×15): qty 1

## 2017-01-28 MED ORDER — MAGNESIUM HYDROXIDE 400 MG/5ML PO SUSP: 30.0000 mL | Freq: Every day | ORAL | Status: DC | PRN

## 2017-01-28 MED ORDER — ALUM & MAG HYDROXIDE-SIMETH 200-200-20 MG/5ML PO SUSP: 30.0000 mL | ORAL | Status: DC | PRN

## 2017-01-28 NOTE — Plan of Care (Signed)
Problem: Education: Goal: Knowledge of [place] [facility] Education information/materials will improve Outcome: Progressing Verbalize  Understanding  Of information received Goal: Emotional status will improve Outcome: Not Progressing New Admission  Goal: Mental status will improve Outcome: Not Progressing New Admission  Goal: Verbalization of understanding the information provided will improve Outcome: Not Progressing New Admission   Problem: Coping: Goal: Ability to verbalize frustrations and anger appropriately will improve Outcome: Not Progressing New Admission  Goal: Ability to demonstrate self-control will improve Outcome: Not Progressing New Admission   Problem: Education: Goal: Ability to make informed decisions regarding treatment will improve Outcome: Not Progressing New Admission   Problem: Activity: Goal: Will verbalize the importance of balancing activity with adequate rest periods Outcome: Not Progressing New Admission   Problem: Coping: Goal: Ability to cope will improve Outcome: Not Progressing New Admission

## 2017-01-28 NOTE — Tx Team (Signed)
Initial Treatment Plan 01/28/2017 7:08 PM [name] [id number]    PATIENT STRESSORS: Financial difficulties Medication change or noncompliance   PATIENT STRENGTHS: Ability for insight Average or above average intelligence Capable of independent living Communication skills   PATIENT IDENTIFIED PROBLEMS:  Depression  01/28/17  Altered Thought  01/28/17  Suicidal 01/28/17                 DISCHARGE CRITERIA:  Ability to meet basic life and health needs Adequate post-discharge living arrangements Improved stabilization in mood, thinking, and/or behavior  PRELIMINARY DISCHARGE PLAN: Outpatient therapy Return to previous living arrangement  PATIENT/FAMILY INVOLVEMENT: This treatment plan has been presented to and reviewed with the patient, [name], and/or family member, The patient and family have been given the opportunity to ask questions and make suggestions.  Leodis Liverpool, RN 01/28/2017, 7:08 PM

## 2017-01-28 NOTE — BHH Suicide Risk Assessment (Signed)
[facility] Admission Suicide Risk Assessment   Nursing information obtained from:    Demographic factors:    Current Mental Status:    Loss Factors:    Historical Factors:    Risk Reduction Factors:     Total Time spent with patient: 1 hour Principal Problem: Schizoaffective disorder, bipolar type ([place]) Diagnosis:   Patient Active Problem List   Diagnosis Date Noted  . No diagnosis on Axis I [Z03.89] 08/21/2015  . Abnormal behavior [R46.89]   . Involuntary commitment [Z04.6]   . Schizophrenia ([place]) [F20.9]   . Disturbance in affect [R45.89]   . Tobacco use disorder [F17.200] 05/12/2015  . Cannabis use disorder, moderate, dependence ([place]) [F12.20] 05/12/2015  . Hypertension [I10] 02/24/2011  . ANEMIA-IRON DEFICIENCY [D50.9] 12/13/2006  . GERD [K21.9] 12/13/2006  . OBESITY [E66.9] 12/12/2006  . Schizoaffective disorder, bipolar type ([place]) [F25.0] 12/12/2006  . HEMORRHOIDS, NOS [K64.9] 12/12/2006  . PAIN-NECK [M54.2] 12/12/2006  . TREMOR [R25.9] 12/12/2006   Subjective Data: psychotic break.  Continued Clinical Symptoms:  Alcohol Use Disorder Identification Test Final Score (AUDIT): 0 The "Alcohol Use Disorders Identification Test", Guidelines for Use in Primary Care, Second Edition.  World Pharmacologist [facility]). Score between 0-7:  no or low risk or alcohol related problems. Score between 8-15:  moderate risk of alcohol related problems. Score between 16-19:  high risk of alcohol related problems. Score 20 or above:  warrants further diagnostic evaluation for alcohol dependence and treatment.   CLINICAL FACTORS:   Schizophrenia:   Command hallucinatons   Musculoskeletal: Strength & Muscle Tone: within normal limits Gait & Station: normal Patient leans: N/A  Psychiatric Specialty Exam: Physical Exam  Nursing note and vitals reviewed. Psychiatric: Her affect is labile. Her speech is rapid and/or pressured. She is actively hallucinating. Thought content is paranoid and  delusional. Cognition and memory are normal. She expresses impulsivity.    Review of Systems  Psychiatric/Behavioral: Positive for hallucinations. The patient is nervous/anxious and has insomnia.   All other systems reviewed and are negative.   Blood pressure 125/83, pulse 75, temperature 98.6 F (37 C), temperature source Oral, height 5\' 4"  (1.626 m), weight 112 kg (247 lb), last menstrual period 01/03/2017, SpO2 99 %.Body mass index is 42.4 kg/m.  General Appearance: Casual  Eye Contact:  Good  Speech:  Pressured  Volume:  Increased  Mood:  Angry  Affect:  Congruent  Thought Process:  Goal Directed and Descriptions of Associations: Intact  Orientation:  Full (Time, Place, and Person)  Thought Content:  Delusions, Hallucinations: Auditory and Paranoid Ideation  Suicidal Thoughts:  No  Homicidal Thoughts:  No  Memory:  Immediate;   Fair Recent;   Fair Remote;   Fair  Judgement:  Poor  Insight:  Lacking  Psychomotor Activity:  Normal  Concentration:  Concentration: Fair and Attention Span: Fair  Recall:  [employer] of Knowledge:  Fair  Language:  Fair  Akathisia:  No  Handed:  Right  AIMS (if indicated):     Assets:  Communication Skills Desire for Improvement Financial Resources/Insurance Housing Physical Health Resilience Social Support  ADL's:  Impaired  Cognition:  WNL  Sleep:         COGNITIVE FEATURES THAT CONTRIBUTE TO RISK:  None    SUICIDE RISK:   Moderate:  Frequent suicidal ideation with limited intensity, and duration, some specificity in terms of plans, no associated intent, good self-control, limited dysphoria/symptomatology, some risk factors present, and identifiable protective factors, including available and accessible social support.  PLAN OF CARE: Hospital admission, medication management, discharge planning.  Ms. Rath is a 46 year old female with history of schizoaffective disorder admitted for worsening of psychosis with auditory command  hallucinations and paranoia in the context of treatment noncompliance.  1. Psychosis. The patient was already given Haldol Decanoate injection at [facility] emergency room. She is very unhappy about it as she shakes a lot from Haldol. She prefers to be on [place]. We will start Fanapt 8 mg twice daily.   2. EPS. We will give Amantadine 100 mg twice daily.  3. Insomnia. Trazodone is available.  4. Smoking. Nicotine patch is available.  5. Metabolic syndrome monitoring. Lipid panel, TSH, hemoglobin A1c are pending.  6. EKG. Pending.  7. Pregnancy test. Pending.  8. Disposition. She will return to her apartment. She will follow up with [facility].    I certify that inpatient services furnished can reasonably be expected to improve the patient's condition.   Orson Slick, MD 01/28/2017, 12:43 PM

## 2017-01-28 NOTE — ED Notes (Signed)
Pt discharged safely with [place] driver.  Pt was calm but irritable.  All belongings were sent with pt.  Pt refused vitals at Discharge and refused to sign her belongings sheet.

## 2017-01-28 NOTE — H&P (Signed)
Psychiatric Admission Assessment Adult  Patient Identification: [name] MRN:  [id number] Date of Evaluation:  01/28/2017 Chief Complaint:  schizoaffective disorder Principal Diagnosis: Schizoaffective disorder, bipolar type ([place]) Diagnosis:   Patient Active Problem List   Diagnosis Date Noted  . No diagnosis on Axis I [Z03.89] 08/21/2015  . Abnormal behavior [R46.89]   . Involuntary commitment [Z04.6]   . Schizophrenia ([place]) [F20.9]   . Disturbance in affect [R45.89]   . Tobacco use disorder [F17.200] 05/12/2015  . Cannabis use disorder, moderate, dependence ([place]) [F12.20] 05/12/2015  . Hypertension [I10] 02/24/2011  . ANEMIA-IRON DEFICIENCY [D50.9] 12/13/2006  . GERD [K21.9] 12/13/2006  . OBESITY [E66.9] 12/12/2006  . Schizoaffective disorder, bipolar type ([place]) [F25.0] 12/12/2006  . HEMORRHOIDS, NOS [K64.9] 12/12/2006  . PAIN-NECK [M54.2] 12/12/2006  . TREMOR [R25.9] 12/12/2006   History of Present Illness:   Identifying data. [name] is a 46 year old female with a history of schizoaffective disorder.  Chief complaint. "Devil attacked me."  History of present illness. Information was obtained from the patient and the chart. The patient was transferred from [facility] emergency room where she was seen for exacerbation of psychotic symptoms with paranoia and delusions, vivid visual hallucinations and auditory commands. She is very frightened. She believes that the devil is telling her to remember all bad things from the past. She does not feel depressed. She denies symptoms of bipolar mania. She endorses infrequent panic attacks. It is difficult to this whether or not she has PTSD symptoms as she relates everything to hallucinations. She is rather labile, irritable, and unhappy on the interview. She is that she was "forced" to take Haldol Decanoate injection at [facility]. Haldol makes her shake and she feels that it adds another illness. She has me that, is the only  medication that does not make her shake. She reports poor sleep. She denies alcohol or substance use.   Past psychiatric history. The patient was diagnosed with schizoaffective disorder at the age of 34. She has been hospitalized multiple times. She denies suicide attempts. She has had multiple medication trials including all antipsychotics that I can name. She was tried on lithium and Depakote and does not believe they are helpful. She does not remember Tegretol, Trileptal, Topamax.  Family psychiatric history. Nonreported.  Social history. She is disabled from mental illness. She lives independently. She follows up with ACT team.  Total Time spent with patient: 1 hour  Is the patient at risk to self? No.  Has the patient been a risk to self in the past 6 months? No.  Has the patient been a risk to self within the distant past? No.  Is the patient a risk to others? No.  Has the patient been a risk to others in the past 6 months? No.  Has the patient been a risk to others within the distant past? No.   Prior Inpatient Therapy:   Prior Outpatient Therapy:    Alcohol Screening: 1. How often do you have a drink containing alcohol?: Never 2. How many drinks containing alcohol do you have on a typical day when you are drinking?: 1 or 2 3. How often do you have six or more drinks on one occasion?: Never Preliminary Score: 0 4. How often during the last year have you found that you were not able to stop drinking once you had started?: Never 5. How often during the last year have you failed to do what was normally expected from you becasue of drinking?: Never  6. How often during the last year have you needed a first drink in the morning to get yourself going after a heavy drinking session?: Never 7. How often during the last year have you had a feeling of guilt of remorse after drinking?: Never 8. How often during the last year have you been unable to remember what happened the night before  because you had been drinking?: Never 9. Have you or someone else been injured as a result of your drinking?: No 10. Has a relative or friend or a doctor or another health worker been concerned about your drinking or suggested you cut down?: No Alcohol Use Disorder Identification Test Final Score (AUDIT): 0 Brief Intervention: AUDIT score less than 7 or less-screening does not suggest unhealthy drinking-brief intervention not indicated Substance Abuse History in the last 12 months:  No. Consequences of Substance Abuse: NA Previous Psychotropic Medications: Yes  Psychological Evaluations: No  Past Medical History:  Past Medical History:  Diagnosis Date  . Arthritis   . Bipolar 1 disorder ([place])   . Bronchitis   . Depression   . Hemorrhoid   . Schizoaffective disorder ([place])   . Schizophrenia ([place])    Pt denies and reports it is schizoaffective disorder.   . Transfusion history     Past Surgical History:  Procedure Laterality Date  . CESAREAN SECTION     Family History:  Family History  Problem Relation Age of Onset  . Diabetes type II Other    Tobacco Screening: Have you used any form of tobacco in the last 30 days? (Cigarettes, Smokeless Tobacco, Cigars, and/or Pipes): Yes Tobacco use, Select all that apply: 5 or more cigarettes per day Are you interested in Tobacco Cessation Medications?: Yes, will notify MD for an order Counseled patient on smoking cessation including recognizing danger situations, developing coping skills and basic information about quitting provided: Refused/Declined practical counseling Social History:  History  Alcohol Use No     History  Drug Use No    Comment: Former user    Additional Social History:                           Allergies:   Allergies  Allergen Reactions  . Anette Guarneri [Lurasidone Hcl] Nausea And Vomiting and Other (See Comments)    Reaction:  Shaking   . Abilify [Aripiprazole] Other (See Comments)    shaking  . Flagyl  [Metronidazole Hcl] Nausea And Vomiting  . Prolixin [Fluphenazine] Other (See Comments)    shaking  . Risperidone And Related Other (See Comments)    shaking  . Seroquel [Quetiapine Fumarate]     Patient states she had a syncopal episode after taking medication.   . Sulfa Antibiotics Nausea And Vomiting   Lab Results: No results found for this or any previous visit (from the past 48 hour(s)).  Blood Alcohol level:  Lab Results  Component Value Date   ETH <10 01/26/2017   ETH <5 70/78/6754    Metabolic Disorder Labs:  Lab Results  Component Value Date   HGBA1C 6.1 (H) 05/11/2015   MPG 128 05/11/2015   No results found for: PROLACTIN Lab Results  Component Value Date   CHOL 151 05/11/2015   TRIG 93 05/11/2015   HDL 35 (L) 05/11/2015   CHOLHDL 4.3 05/11/2015   VLDL 19 05/11/2015   LDLCALC 97 05/11/2015    Current Medications: Current Facility-Administered Medications  Medication Dose Route Frequency Provider Last Rate Last  Dose  . alum & mag hydroxide-simeth (MAALOX/MYLANTA) 200-200-20 MG/5ML suspension 30 mL  30 mL Oral Q4H PRN Ankit Degregorio B, MD      . amantadine (SYMMETREL) capsule 100 mg  100 mg Oral BID Fadi Menter B, MD      . iloperidone (FANAPT) tablet 8 mg  8 mg Oral BID Abrish Erny B, MD      . Derrill Memo ON 01/29/2017] Influenza vac split quadrivalent PF (FLUARIX) injection 0.5 mL  0.5 mL Intramuscular Tomorrow-1000 Keyuana Wank B, MD      . magnesium hydroxide (MILK OF MAGNESIA) suspension 30 mL  30 mL Oral Daily PRN Oday Ridings B, MD      . Derrill Memo ON 01/29/2017] nicotine (NICODERM CQ - dosed in mg/24 hours) patch 21 mg  21 mg Transdermal Q0600 Kazimierz Springborn B, MD      . traZODone (DESYREL) tablet 100 mg  100 mg Oral QHS Maecyn Panning B, MD       PTA Medications: Prescriptions Prior to Admission  Medication Sig Dispense Refill Last Dose  . haloperidol (HALDOL) 5 MG tablet Take 1 tablet (5 mg total) by mouth 2  (two) times daily. (Patient not taking: Reported on 11/09/2015) 60 tablet 0 Not Taking at Unknown time  . INGREZZA 80 MG CAPS Take 1 capsule by mouth daily.  11 Past Month at Unknown time    Musculoskeletal: Strength & Muscle Tone: within normal limits Gait & Station: normal Patient leans: N/A  Psychiatric Specialty Exam: Physical Exam  Nursing note and vitals reviewed. Constitutional: She is oriented to person, place, and time. She appears well-developed and well-nourished.  HENT:  Head: Normocephalic and atraumatic.  Eyes: Pupils are equal, round, and reactive to light. Conjunctivae and EOM are normal.  Neck: Normal range of motion. Neck supple.  Cardiovascular: Normal rate and regular rhythm.   Respiratory: Effort normal and breath sounds normal.  GI: Soft. Bowel sounds are normal.  Musculoskeletal: Normal range of motion.  Neurological: She is alert and oriented to person, place, and time.  Skin: Skin is warm and dry.  Psychiatric: Her affect is labile and inappropriate. Her speech is rapid and/or pressured. She is actively hallucinating. Thought content is paranoid and delusional. Cognition and memory are normal. She expresses impulsivity.    Review of Systems  Psychiatric/Behavioral: Positive for hallucinations. The patient has insomnia.   All other systems reviewed and are negative.   Blood pressure 125/83, pulse 75, temperature 98.6 F (37 C), temperature source Oral, resp. rate 18, height 5\' 4"  (1.626 m), weight 112 kg (247 lb), last menstrual period 01/03/2017, SpO2 99 %.Body mass index is 42.4 kg/m.  See SRA.                                                  Sleep:       Treatment Plan Summary: Daily contact with patient to assess and evaluate symptoms and progress in treatment and Medication management   Ms. Starry is a 46 year old female with history of schizoaffective disorder admitted for worsening of psychosis with auditory command  hallucinations and paranoia in the context of treatment noncompliance.  1. Psychosis. The patient was already given Haldol Decanoate injection at [facility] emergency room. She is very unhappy about it as she shakes a lot from Haldol. She prefers to be on [place]. We will start  Fanapt 8 mg twice daily. I will add Tegretol for mood stabilization.  2. EPS. We will give Amantadine 100 mg twice daily.  3. Insomnia. Trazodone is available.  4. Smoking. Nicotine patch is available.  5. Metabolic syndrome monitoring. Lipid panel, TSH, hemoglobin A1c are pending.  6. EKG. Pending.  7. Pregnancy test. Pending.  8. Disposition. The patient will be discharged to her apartment. She will follow up with [facility].  Observation Level/Precautions:  15 minute checks  Laboratory:  CBC Chemistry Profile UDS UA  Psychotherapy:    Medications:    Consultations:    Discharge Concerns:    Estimated LOS:  Other:     Physician Treatment Plan for Primary Diagnosis: Schizoaffective disorder, bipolar type ([place]) Long Term Goal(s): Improvement in symptoms so as ready for discharge  Short Term Goals: Ability to identify changes in lifestyle to reduce recurrence of condition will improve, Ability to verbalize feelings will improve, Ability to disclose and discuss suicidal ideas, Ability to demonstrate self-control will improve, Ability to identify and develop effective coping behaviors will improve, Ability to maintain clinical measurements within normal limits will improve and Ability to identify triggers associated with substance abuse/mental health issues will improve  Physician Treatment Plan for Secondary Diagnosis: Principal Problem:   Schizoaffective disorder, bipolar type ([place]) Active Problems:   Tobacco use disorder  Long Term Goal(s): NA  Short Term Goals: NA  I certify that inpatient services furnished can reasonably be expected to improve the patient's condition.     Orson Slick, MD 10/12/20181:04 PM

## 2017-01-28 NOTE — BHH Group Notes (Signed)
[facility] Group Notes:  (Nursing/MHT/Case Management/Adjunct)  Date:  01/28/2017  Time:  9:29 PM  Type of Therapy:  Psychoeducational Skills  Participation Level:  None  Participation Quality:  Inattentive  Affect:  Flat  Cognitive:  Alert  Insight:  Lacking and Limited  Engagement in Group:  None  Modes of Intervention:  Discussion, Socialization and Support  Summary of Progress/Problems:  [name] 01/28/2017, 9:29 PM

## 2017-01-28 NOTE — Progress Notes (Signed)
Admission Note: [name]   D: Pt appeared depressed  With  a flat affect.  Pt  denies SI / AVH at this time. But then stated she had thoughts of suicide  With the Lords plan  voice of the devil  Being in her life  And directing her and attacking her .  Reported the  Devil trying To kill her and cut her up .     Pt is redirectable and cooperative with assessment.      A: Pt admitted to unit per protocol, skin assessment and search done with Olivia  MHT and no contraband found.  Pt  educated on therapeutic milieu rules. Pt was introduced to milieu by nursing staff.    R: Pt was receptive to education about the milieu .  15 min safety checks started. Probation officer offered support

## 2017-01-29 LAB — LIPID PANEL
CHOLESTEROL: 157 mg/dL (ref 0–200)
HDL: 40 mg/dL — ABNORMAL LOW (ref >40)
LDL CALC: 93 mg/dL (ref 0–99)
Total CHOL/HDL Ratio: 3.9 RATIO
Triglycerides: 119 mg/dL (ref <150)
VLDL: 24 mg/dL (ref 0–40)

## 2017-01-29 LAB — TSH: TSH: 2.594 u[IU]/mL (ref 0.350–4.500)

## 2017-01-29 NOTE — Plan of Care (Signed)
Problem: Spiritual Needs Goal: Ability to function at adequate level Outcome: Progressing Patient encouraged to get out of bed more  Problem: Education: Goal: Mental status will improve Outcome: Progressing Patient med compliant.

## 2017-01-29 NOTE — BHH Suicide Risk Assessment (Signed)
[facility] INPATIENT:  Family/Significant Other Suicide Prevention Education  Suicide Prevention Education:  Patient Refusal for Family/Significant Other Suicide Prevention Education: The patient [name] has refused to provide written consent for family/significant other to be provided Family/Significant Other Suicide Prevention Education during admission and/or prior to discharge.  Physician notified. Pt reports she has no support person that she could have CSW call.  Joanne Chars, LCSW 01/29/2017, 3:40 PM

## 2017-01-29 NOTE — BHH Group Notes (Signed)
[facility] Group Notes:  (Nursing/MHT/Case Management/Adjunct)  Date:  01/29/2017  Time:  9:46 PM  Type of Therapy:  Group Therapy  Participation Level:  Did Not Attend  Participation QualitySummary of Progress/Problems:  [name] 01/29/2017, 9:46 PM

## 2017-01-29 NOTE — Progress Notes (Signed)
Pt is very isolative, just stayed in room and slept. Alert and oriented x 4. Mood is depressed. Pt has minimal interaction with staff and peers. Pt is cooperative and med compliant. Denies SI, HI or A/V hallucinations. Denies any pain or discomfort at this time. No s/s of distress noted. Contracted for safety. 15 min safety checks continues.

## 2017-01-29 NOTE — Plan of Care (Signed)
Problem: Education: Goal: Emotional status will improve Outcome: Progressing Patient is participating more with activities  And very vocal with peers  Problem: Coping: Goal: Ability to demonstrate self-control will improve Outcome: Progressing Patient is cooperating with medical regimen and ADLs

## 2017-01-29 NOTE — Plan of Care (Signed)
Problem: Coping: Goal: Ability to demonstrate self-control will improve Outcome: Progressing Pt will demonstrate self-control the entire shift.

## 2017-01-29 NOTE — Progress Notes (Signed)
Patient is alert and oriented she is pleasant. She denies voices, hallucinations, and si, she does lay in the bed most of the morning. She does come out for meds and meals. She received her flu shot and tolerated well. No signs of distress noted.
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
## 2017-01-29 NOTE — Progress Notes (Signed)
[facility] MD Progress Note  01/29/2017 4:52 PM [name]  MRN:  [id number] Subjective:   Patient endorses to me that she continues to have auditory hallucinations. Endorses that she can hear the devil telling her all sorts of things. In addition she says that the devil cuts her up. Was not able to elucidate to me how this happens, but she confirmed that this is happening. Patient was lying in bed and responding to internal stimuli as she was talking to me during the interview. Endorses that she is distressed by the hallucinations. Says they are little better than when she came in. Denies any suicidal thoughts or thoughts about dying. Endorses that she does want to get better. Says it is difficult for her to differentiate what is real and what is not real. Principal Problem: Schizoaffective disorder, bipolar type [facility]) Diagnosis:   Patient Active Problem List   Diagnosis Date Noted  . No diagnosis on Axis I [Z03.89] 08/21/2015  . Abnormal behavior [R46.89]   . Involuntary commitment [Z04.6]   . Schizophrenia ([place]) [F20.9]   . Disturbance in affect [R45.89]   . Tobacco use disorder [F17.200] 05/12/2015  . Cannabis use disorder, moderate, dependence ([place]) [F12.20] 05/12/2015  . Hypertension [I10] 02/24/2011  . ANEMIA-IRON DEFICIENCY [D50.9] 12/13/2006  . GERD [K21.9] 12/13/2006  . OBESITY [E66.9] 12/12/2006  . Schizoaffective disorder, bipolar type ([place]) [F25.0] 12/12/2006  . HEMORRHOIDS, NOS [K64.9] 12/12/2006  . PAIN-NECK [M54.2] 12/12/2006  . TREMOR [R25.9] 12/12/2006   Total Time spent with patient: 30 minutes  Past Psychiatric History: patient has had multiple inpatient hospitalizations from the time she was 46 years old. Has had allergies to multiple antipsychotics which include Abilify, Prolixin, Risperdal,Seroquel, Latuda. Feels that the medications that she is on now are working.  Past Medical History:  Past Medical History:  Diagnosis Date  . Arthritis   . Bipolar 1  disorder ([place])   . Bronchitis   . Depression   . Hemorrhoid   . Schizoaffective disorder ([place])   . Schizophrenia ([place])    Pt denies and reports it is schizoaffective disorder.   . Transfusion history     Past Surgical History:  Procedure Laterality Date  . CESAREAN SECTION     Family History:  Family History  Problem Relation Age of Onset  . Diabetes type II Other    Family Psychiatric  History:  Social History:  History  Alcohol Use No     History  Drug Use No    Comment: Former user    Social History   Social History  . Marital status: Legally Separated    Spouse name: N/A  . Number of children: N/A  . Years of education: N/A   Social History Main Topics  . Smoking status: Current Every Day Smoker    Packs/day: 0.50    Years: 17.00    Types: Cigarettes  . Smokeless tobacco: Never Used  . Alcohol use No  . Drug use: No     Comment: Former user  . Sexual activity: No   Other Topics Concern  . None   Social History Narrative   ** Merged History Encounter **       Additional Social History:                         Sleep: Fair  Appetite:  Fair  Current Medications: Current Facility-Administered Medications  Medication Dose Route Frequency Provider Last Rate Last Dose  . alum &  mag hydroxide-simeth (MAALOX/MYLANTA) 200-200-20 MG/5ML suspension 30 mL  30 mL Oral Q4H PRN Pucilowska, Jolanta B, MD      . amantadine (SYMMETREL) capsule 100 mg  100 mg Oral BID Pucilowska, Jolanta B, MD   100 mg at 01/29/17 0846  . iloperidone (FANAPT) tablet 8 mg  8 mg Oral BID Pucilowska, Jolanta B, MD   8 mg at 01/29/17 0846  . magnesium hydroxide (MILK OF MAGNESIA) suspension 30 mL  30 mL Oral Daily PRN Pucilowska, Jolanta B, MD      . nicotine (NICODERM CQ - dosed in mg/24 hours) patch 21 mg  21 mg Transdermal Q0600 Pucilowska, Jolanta B, MD   21 mg at 01/29/17 0639  . Oxcarbazepine (TRILEPTAL) tablet 300 mg  300 mg Oral BID Pucilowska, Jolanta B, MD   300 mg  at 01/29/17 0846  . traZODone (DESYREL) tablet 100 mg  100 mg Oral QHS Pucilowska, Jolanta B, MD   100 mg at 01/28/17 2147    Lab Results:  Results for orders placed or performed during the hospital encounter of 01/28/17 (from the past 48 hour(s))  Pregnancy, urine     Status: None   Collection Time: 01/28/17  8:20 PM  Result Value Ref Range   Preg Test, Ur NEGATIVE NEGATIVE  Lipid panel     Status: Abnormal   Collection Time: 01/29/17  2:47 PM  Result Value Ref Range   Cholesterol 157 0 - 200 mg/dL   Triglycerides 119 <150 mg/dL   HDL 40 (L) >40 mg/dL   Total CHOL/HDL Ratio 3.9 RATIO   VLDL 24 0 - 40 mg/dL   LDL Cholesterol 93 0 - 99 mg/dL    Comment:        Total Cholesterol/HDL:CHD Risk Coronary Heart Disease Risk Table                     Men   Women  1/2 Average Risk   3.4   3.3  Average Risk       5.0   4.4  2 X Average Risk   9.6   7.1  3 X Average Risk  23.4   11.0        Use the calculated Patient Ratio above and the CHD Risk Table to determine the patient's CHD Risk.        ATP III CLASSIFICATION (LDL):  <100     mg/dL   Optimal  100-129  mg/dL   Near or Above                    Optimal  130-159  mg/dL   Borderline  160-189  mg/dL   High  >190     mg/dL   Very High   TSH     Status: None   Collection Time: 01/29/17  2:47 PM  Result Value Ref Range   TSH 2.594 0.350 - 4.500 uIU/mL    Comment: Performed by a 3rd Generation assay with a functional sensitivity of <=0.01 uIU/mL.    Blood Alcohol level:  Lab Results  Component Value Date   ETH <10 01/26/2017   ETH <5 25/85/2778    Metabolic Disorder Labs: Lab Results  Component Value Date   HGBA1C 6.1 (H) 05/11/2015   MPG 128 05/11/2015   No results found for: PROLACTIN Lab Results  Component Value Date   CHOL 157 01/29/2017   TRIG 119 01/29/2017   HDL 40 (L) 01/29/2017   CHOLHDL 3.9 01/29/2017  VLDL 24 01/29/2017   LDLCALC 93 01/29/2017   LDLCALC 97 05/11/2015    Physical Findings: AIMS:   , ,  ,  ,    CIWA:    COWS:     Musculoskeletal: Strength & Muscle Tone: within normal limits Gait & Station: normal Patient leans: N/A  Psychiatric Specialty Exam: Physical Exam  ROS  Blood pressure 116/66, pulse 83, temperature 98.7 F (37.1 C), temperature source Oral, resp. rate 18, height 5\' 4"  (1.626 m), weight 247 lb (112 kg), last menstrual period 01/03/2017, SpO2 99 %.Body mass index is 42.4 kg/m.  General Appearance: Fairly Groomed  Eye Contact:  Poor  Speech:  Normal Rate  Volume:  Normal  Mood:  Dysphoric  Affect:  Flat  Thought Process:  Linear  Orientation:  Full (Time, Place, and Person)  Thought Content:  Delusions and Hallucinations: Auditory  Suicidal Thoughts:  No  Homicidal Thoughts:  No  Memory:  NA  Judgement:  Impaired  Insight:  Fair  Psychomotor Activity:  Normal  AIMS (if indicated):     Assets:  Communication Skills Desire for Improvement Others:  insight into illness  ADL's:  Intact  Cognition:  WNL  Sleep:        Treatment Plan Summary: Daily contact with patient to assess and evaluate symptoms and progress in treatment  [name] is a 46 year old female with history of schizoaffective disorder admitted for worsening of psychosis with auditory command hallucinations and paranoia in the context of treatment noncompliance. Currently endorses active auditory hallucinations which cause her distress. Feels that the medication regimen that she is on is working, has not been able to tolerate multiple antipsychotics in the past and as a result is on a regimen comprising iloperidone and Trileptal. Denies being on Ingrezza in the past.   1. Psychosis. The patient was already given Haldol Decanoate injection at [facility] emergency room. She is very unhappy about it as she shakes a lot from Haldol. She prefers to be on [place]. We will start Fanapt 8 mg twice daily. I will add Tegretol for mood stabilization.  2. EPS. We will give Amantadine 100 mg  twice daily.  3. Insomnia. Trazodone is available.  4. Smoking. Nicotine patch is available.  5. Metabolic syndrome monitoring.A1c pending. TSH and lipid panel are within normal limits  6. EKG. QTc is 415 ms  7. Pregnancy test. negative  8. Disposition. The patient will be discharged to her apartment. She will [facility]. Ramond Dial, MD 01/29/2017, 4:52 PM

## 2017-01-29 NOTE — BHH Group Notes (Signed)
LCSW Group Therapy Note   01/29/2017 1:15pm   Type of Therapy and Topic:  Group Therapy:  Trust and Honesty  Participation Level:  Active  Description of Group:    In this group patients will be asked to explore the value of being honest.  Patients will be guided to discuss their thoughts, feelings, and behaviors related to honesty and trusting in others. Patients will process together how trust and honesty relate to forming relationships with peers, family members, and self. Each patient will be challenged to identify and express feelings of being vulnerable. Patients will discuss reasons why people are dishonest and identify alternative outcomes if one was truthful (to self or others). This group will be process-oriented, with patients participating in exploration of their own experiences, giving and receiving support, and processing challenge from other group members.   Therapeutic Goals: 1. Patient will identify why honesty is important to relationships and how honesty overall affects relationships.  2. Patient will identify a situation where they lied or were lied too and the  feelings, thought process, and behaviors surrounding the situation 3. Patient will identify the meaning of being vulnerable, how that feels, and how that correlates to being honest with self and others. 4. Patient will identify situations where they could have told the truth, but instead lied and explain reasons of dishonesty.   Summary of Patient Progress: Pt was active part of discussion regarding the above and talking about how being dishonest affects self esteem.  Good participation.    Therapeutic Modalities:   Cognitive Behavioral Therapy Solution Focused Therapy Motivational Interviewing Brief Therapy  Joanne Chars, LCSW 01/29/2017 3:25 PM

## 2017-01-30 MED ORDER — HALOPERIDOL DECANOATE 100 MG/ML IM SOLN: 50.0000 mg | INTRAMUSCULAR | Status: DC

## 2017-01-30 NOTE — BHH Group Notes (Signed)
LCSW Group Therapy Note   01/30/2017 1:15pm   Type of Therapy and Topic:  Group Therapy:  Positive Affirmations   Participation Level:  Did not attend  Description of Group: This group addressed positive affirmation toward self and others. Patients went around the room and identified two positive things about themselves and two positive things about a peer in the room. Patients reflected on how it felt to share something positive with others, to identify positive things about themselves, and to hear positive things from others. Patients were encouraged to have a daily reflection of positive characteristics or circumstances.  Therapeutic Goals 1. Patient will verbalize two of their positive qualities 2. Patient will demonstrate empathy for others by stating two positive qualities about a peer in the group 3. Patient will verbalize their feelings when voicing positive self affirmations and when voicing positive affirmations of others 4. Patients will discuss the potential positive impact on their wellness/recovery of focusing on positive traits of self and others. Summary of Patient Progress:    Therapeutic Modalities Cognitive Behavioral Therapy Motivational Interviewing  Trish Mage, [place] 01/30/2017 2:37 PM

## 2017-01-30 NOTE — Plan of Care (Signed)
Problem: Education: Goal: Ability to make informed decisions regarding treatment will improve Outcome: Progressing [name] denied feeling suicidal this am.  Problem: Coping: Goal: Ability to cope will improve Outcome: Not Progressing [name] has been isolated to her room most of the day except for meals.

## 2017-01-30 NOTE — Plan of Care (Signed)
Problem: Coping: Goal: Ability to verbalize frustrations and anger appropriately will improve Outcome: Progressing Pt will be able to verbalize frustration and anger will improve.

## 2017-01-30 NOTE — Progress Notes (Signed)
Received [name] this  Am in her room. She ate breakfast and returned to bed, she was compliant with her medications, but requested I bring them to her. She denied all of the psychiatric symptoms to this Probation officer. She was OOB for meals only and her affect has been flat.

## 2017-01-30 NOTE — BHH Group Notes (Signed)
[facility] Group Notes:  (Nursing/MHT/Case Management/Adjunct)  Date:  01/30/2017  Time:  11:18 PM  Type of Therapy:  Psychoeducational Skills  Participation Level:  Active  Participation Quality:  Appropriate, Attentive and Sharing  Affect:  Appropriate  Cognitive:  Appropriate  Insight:  Appropriate and Good  Engagement in Group:  Engaged  Modes of Intervention:  Discussion, Socialization and Support  Summary of Progress/Problems:  [name] 01/30/2017, 11:18 PM

## 2017-01-30 NOTE — Progress Notes (Signed)
[facility] MD Progress Note  01/30/2017 5:19 PM [name]  MRN:  [id number] Subjective:   01/29/17 Patient endorses to me that she continues to have auditory hallucinations. Endorses that she can hear the devil telling her all sorts of things. In addition she says that the devil cuts her up. Was not able to elucidate to me how this happens, but she confirmed that this is happening. Patient was lying in bed and responding to internal stimuli as she was talking to me during the interview. Endorses that she is distressed by the hallucinations. Says they are little better than when she came in. Denies any suicidal thoughts or thoughts about dying. Endorses that she does want to get better. Says it is difficult for her to differentiate what is real and what is not real.  01/30/17 - patient endorses to me that she can see the devil doing certain things and hears about them too. Endorses that it's been an unpleasant experience and spends most of her time in bed. Details to me that she was taking iloperidone sporadically before coming to the hospital. Has been on a consistent dose only since the 12th. Denies any stiffness of her extremities or any other changes in muscle tone. Denies any suicidal thoughts or thoughts about dying.  Is currently on 8 mg twice a day of iloperidone with Trileptal 300 mg twice a day.also takes trazodone 100 mg at bedtime, nicotine patch and amantadine 100 mg twice a day. Principal Problem: Schizoaffective disorder, bipolar type ([name]) Diagnosis:   Patient Active Problem List   Diagnosis Date Noted  . No diagnosis on Axis I [Z03.89] 08/21/2015  . Abnormal behavior [R46.89]   . Involuntary commitment [Z04.6]   . Schizophrenia ([name]) [F20.9]   . Disturbance in affect [R45.89]   . Tobacco use disorder [F17.200] 05/12/2015  . Cannabis use disorder, moderate, dependence ([name]) [F12.20] 05/12/2015  . Hypertension [I10] 02/24/2011  . ANEMIA-IRON DEFICIENCY [D50.9] 12/13/2006  .  GERD [K21.9] 12/13/2006  . OBESITY [E66.9] 12/12/2006  . Schizoaffective disorder, bipolar type ([name]) [F25.0] 12/12/2006  . HEMORRHOIDS, NOS [K64.9] 12/12/2006  . PAIN-NECK [M54.2] 12/12/2006  . TREMOR [R25.9] 12/12/2006   Total Time spent with patient: 30 minutes  Past Psychiatric History: patient has had multiple inpatient hospitalizations from the time she was 46 years old. Has had allergies to multiple antipsychotics which include Abilify, Prolixin, Risperdal,Seroquel, Latuda. Feels that the medications that she is on now are working.  Past Medical History:  Past Medical History:  Diagnosis Date  . Arthritis   . Bipolar 1 disorder ([name])   . Bronchitis   . Depression   . Hemorrhoid   . Schizoaffective disorder ([name])   . Schizophrenia ([name])    Pt denies and reports it is schizoaffective disorder.   . Transfusion history     Past Surgical History:  Procedure Laterality Date  . CESAREAN SECTION     Family History:  Family History  Problem Relation Age of Onset  . Diabetes type II Other    Family Psychiatric  History:  Social History:  History  Alcohol Use No     History  Drug Use No    Comment: Former user    Social History   Social History  . Marital status: Legally Separated    Spouse name: N/A  . Number of children: N/A  . Years of education: N/A   Social History Main Topics  . Smoking status: Current Every Day Smoker    Packs/day: 0.50  Years: 17.00    Types: Cigarettes  . Smokeless tobacco: Never Used  . Alcohol use No  . Drug use: No     Comment: Former user  . Sexual activity: No   Other Topics Concern  . None   Social History Narrative   ** Merged History Encounter **       Additional Social History:                         Sleep: Fair  Appetite:  Fair  Current Medications: Current Facility-Administered Medications  Medication Dose Route Frequency Provider Last Rate Last Dose  . alum & mag hydroxide-simeth  (MAALOX/MYLANTA) 200-200-20 MG/5ML suspension 30 mL  30 mL Oral Q4H PRN Pucilowska, Jolanta B, MD      . amantadine (SYMMETREL) capsule 100 mg  100 mg Oral BID Pucilowska, Jolanta B, MD   100 mg at 01/30/17 0923  . [START ON 02/24/2017] haloperidol decanoate (HALDOL DECANOATE) 100 MG/ML injection 50 mg  50 mg Intramuscular Q28 days Pucilowska, Jolanta B, MD      . iloperidone (FANAPT) tablet 8 mg  8 mg Oral BID Pucilowska, Jolanta B, MD   8 mg at 01/30/17 1134  . magnesium hydroxide (MILK OF MAGNESIA) suspension 30 mL  30 mL Oral Daily PRN Pucilowska, Jolanta B, MD      . nicotine (NICODERM CQ - dosed in mg/24 hours) patch 21 mg  21 mg Transdermal Q0600 Pucilowska, Jolanta B, MD   21 mg at 01/29/17 0639  . Oxcarbazepine (TRILEPTAL) tablet 300 mg  300 mg Oral BID Pucilowska, Jolanta B, MD   300 mg at 01/30/17 0924  . traZODone (DESYREL) tablet 100 mg  100 mg Oral QHS Pucilowska, Jolanta B, MD   100 mg at 01/29/17 2126    Lab Results:  Results for orders placed or performed during the hospital encounter of 01/28/17 (from the past 48 hour(s))  Pregnancy, urine     Status: None   Collection Time: 01/28/17  8:20 PM  Result Value Ref Range   Preg Test, Ur NEGATIVE NEGATIVE  Lipid panel     Status: Abnormal   Collection Time: 01/29/17  2:47 PM  Result Value Ref Range   Cholesterol 157 0 - 200 mg/dL   Triglycerides 119 <150 mg/dL   HDL 40 (L) >40 mg/dL   Total CHOL/HDL Ratio 3.9 RATIO   VLDL 24 0 - 40 mg/dL   LDL Cholesterol 93 0 - 99 mg/dL    Comment:        Total Cholesterol/HDL:CHD Risk Coronary Heart Disease Risk Table                     Men   Women  1/2 Average Risk   3.4   3.3  Average Risk       5.0   4.4  2 X Average Risk   9.6   7.1  3 X Average Risk  23.4   11.0        Use the calculated Patient Ratio above and the CHD Risk Table to determine the patient's CHD Risk.        ATP III CLASSIFICATION (LDL):  <100     mg/dL   Optimal  100-129  mg/dL   Near or Above                     Optimal  130-159  mg/dL   Borderline  160-189  mg/dL   High  >190     mg/dL   Very High   TSH     Status: None   Collection Time: 01/29/17  2:47 PM  Result Value Ref Range   TSH 2.594 0.350 - 4.500 uIU/mL    Comment: Performed by a 3rd Generation assay with a functional sensitivity of <=0.01 uIU/mL.    Blood Alcohol level:  Lab Results  Component Value Date   ETH <10 01/26/2017   ETH <5 59/93/5701    Metabolic Disorder Labs: Lab Results  Component Value Date   HGBA1C 6.1 (H) 05/11/2015   MPG 128 05/11/2015   No results found for: PROLACTIN Lab Results  Component Value Date   CHOL 157 01/29/2017   TRIG 119 01/29/2017   HDL 40 (L) 01/29/2017   CHOLHDL 3.9 01/29/2017   VLDL 24 01/29/2017   LDLCALC 93 01/29/2017   LDLCALC 97 05/11/2015    Physical Findings: AIMS:  , ,  ,  ,    CIWA:    COWS:     Musculoskeletal: Strength & Muscle Tone: within normal limits Gait & Station: normal Patient leans: N/A  Psychiatric Specialty Exam: Physical Exam  ROS  Blood pressure (!) 112/56, pulse 99, temperature 98.9 F (37.2 C), temperature source Oral, resp. rate 18, height 5\' 4"  (1.626 m), weight 247 lb (112 kg), last menstrual period 01/03/2017, SpO2 100 %.Body mass index is 42.4 kg/m.  General Appearance: Fairly Groomed  Eye Contact:  Poor  Speech:  Normal Rate  Volume:  Normal  Mood:  Dysphoric  Affect:  Flat  Thought Process:  Linear  Orientation:  Full (Time, Place, and Person)  Thought Content:  Delusions and Hallucinations: Auditory  Suicidal Thoughts:  No  Homicidal Thoughts:  No  Memory:  NA  Judgement:  Impaired  Insight:  Fair  Psychomotor Activity:  Normal  AIMS (if indicated):     Assets:  Communication Skills Desire for Improvement Others:  insight into illness  ADL's:  Intact  Cognition:  WNL  Sleep:  Number of Hours: 7.45     Treatment Plan Summary: Daily contact with patient to assess and evaluate symptoms and progress in  treatment  Ms. Pelphrey is a 46 year old female with history of schizoaffective disorder admitted for worsening of psychosis with auditory command hallucinations and paranoia in the context of treatment noncompliance. Currently endorses active auditory hallucinations which cause her distress. Feels that the medication regimen that she is on is working, has not been able to tolerate multiple antipsychotics in the past and as a result is on a regimen comprising iloperidone and Trileptal. Denies being on Ingrezza in the past.   1. Psychosis. The patient was already given Haldol Decanoate injection at [facility] emergency room. She is very unhappy about it as she shakes a lot from Haldol. She prefers to be on Sesser. We will start Fanapt 8 mg twice daily. I will add Tegretol for mood stabilization.  2. EPS. We will give Amantadine 100 mg twice daily.  3. Insomnia. Trazodone is available.  4. Smoking. Nicotine patch is available.  5. Metabolic syndrome monitoring.A1c pending. TSH and lipid panel are within normal limits  6. EKG. QTc is 415 ms  7. Pregnancy test. negative  8. Disposition. The patient will be discharged to her apartment. She will follow up with strategic interventions ACT team. Ramond Dial, MD 01/30/2017, 5:19 PM

## 2017-01-30 NOTE — Progress Notes (Signed)
Pt is very pleasant. Pt did interact with peers in the dayroom. Alert and oriented x 4. Denies depression, SI, HI or A/V hallucinations. Contracted to safety. Med compliant, no adverse affects noted. 15 min safety checks continues.

## 2017-01-31 LAB — HEMOGLOBIN A1C
HEMOGLOBIN A1C: 6.3 % — AB (ref 4.8–5.6)
MEAN PLASMA GLUCOSE: 134.11 mg/dL

## 2017-01-31 NOTE — Plan of Care (Signed)
Problem: Coping: Goal: Ability to verbalize frustrations and anger appropriately will improve Outcome: Progressing Patient verbalized that the devil is talking to her at times.  Problem: Education: Goal: Ability to make informed decisions regarding treatment will improve Outcome: Progressing Denies suicidal ideations.

## 2017-01-31 NOTE — BHH Group Notes (Signed)
[place] Group Notes:  (Nursing/MHT/Case Management/Adjunct)  Date:  01/31/2017  Time:  7:02 PM  Type of Therapy:  Psychoeducational Skills  Participation Level:  Active  Participation Quality:  Appropriate  Affect:  Appropriate  Cognitive:  Appropriate  Insight:  Good  Engagement in Group:  Engaged  Modes of Intervention:  Chiropodist of Progress/Problems:  Mystic Labo A Fayette Gasner 01/31/2017, 7:02 PM

## 2017-01-31 NOTE — Progress Notes (Signed)
Patient verbalized that the devil is talking to me.Patient was talking to herself and laughing at times.Patient was standing at the hallway and staring at the wall.Took medicine with much encouragement.Appetite and energy level good.Did not attend groups.Support and encouragement given.

## 2017-01-31 NOTE — Progress Notes (Signed)
[facility] MD Progress Note  01/31/2017 5:39 PM Deyjah Kindel  MRN:  [id number] Subjective:  46 yo female with history of schizoaffective disorder presents due to exacerbation of psychotic symptoms with paranoia, delusions, and command AH. Per  Chart, she was given Haldol Dec at [facility]. Pt was started on Fanapt which pt states is the only m medication she will take because she has side effects with others. Pt is very paranoid towards this provider today. She does come to the room for interview. She states, "I don't trust you. Are you really a psychiatrist?" She has latencies in response and appears very paranoid. She continues to report AH and states, "They are always there but sometimes they get quiet so I can get myself together."Pt denies SI and HI. Pt states that she no longer wants to answer questions because, "I don't think I trust you. I need a new psychiatrist. This is all too weird." Pt then left the room.   Principal Problem: Schizoaffective disorder, bipolar type ([place]) Diagnosis:   Patient Active Problem List   Diagnosis Date Noted  . No diagnosis on Axis I [Z03.89] 08/21/2015  . Abnormal behavior [R46.89]   . Involuntary commitment [Z04.6]   . Schizophrenia ([place]) [F20.9]   . Disturbance in affect [R45.89]   . Tobacco use disorder [F17.200] 05/12/2015  . Cannabis use disorder, moderate, dependence ([place]) [F12.20] 05/12/2015  . Hypertension [I10] 02/24/2011  . ANEMIA-IRON DEFICIENCY [D50.9] 12/13/2006  . GERD [K21.9] 12/13/2006  . OBESITY [E66.9] 12/12/2006  . Schizoaffective disorder, bipolar type ([place]) [F25.0] 12/12/2006  . HEMORRHOIDS, NOS [K64.9] 12/12/2006  . PAIN-NECK [M54.2] 12/12/2006  . TREMOR [R25.9] 12/12/2006   Total Time spent with patient: 15 minutes  Past Psychiatric History: patient has had multiple inpatient hospitalizations from the time she was 46 years old. Has had allergies to multiple antipsychotics which include Abilify, Prolixin, Risperdal,Seroquel,  Latuda.  Past Medical History:  Past Medical History:  Diagnosis Date  . Arthritis   . Bipolar 1 disorder ([place])   . Bronchitis   . Depression   . Hemorrhoid   . Schizoaffective disorder ([place])   . Schizophrenia ([place])    Pt denies and reports it is schizoaffective disorder.   . Transfusion history     Past Surgical History:  Procedure Laterality Date  . CESAREAN SECTION     Family History:  Family History  Problem Relation Age of Onset  . Diabetes type II Other    Social History:  History  Alcohol Use No     History  Drug Use No    Comment: Former user    Social History   Social History  . Marital status: Legally Separated    Spouse name: N/A  . Number of children: N/A  . Years of education: N/A   Social History Main Topics  . Smoking status: Current Every Day Smoker    Packs/day: 0.50    Years: 17.00    Types: Cigarettes  . Smokeless tobacco: Never Used  . Alcohol use No  . Drug use: No     Comment: Former user  . Sexual activity: No   Other Topics Concern  . None   Social History Narrative   ** Merged History Encounter **                              Sleep: Fair  Appetite:  Fair  Current Medications: Current Facility-Administered Medications  Medication Dose  Route Frequency Provider Last Rate Last Dose  . alum & mag hydroxide-simeth (MAALOX/MYLANTA) 200-200-20 MG/5ML suspension 30 mL  30 mL Oral Q4H PRN Pucilowska, Jolanta B, MD      . amantadine (SYMMETREL) capsule 100 mg  100 mg Oral BID Pucilowska, Jolanta B, MD   100 mg at 01/31/17 1655  . [START ON 02/24/2017] haloperidol decanoate (HALDOL DECANOATE) 100 MG/ML injection 50 mg  50 mg Intramuscular Q28 days Pucilowska, Jolanta B, MD      . iloperidone (FANAPT) tablet 8 mg  8 mg Oral BID Pucilowska, Jolanta B, MD   8 mg at 01/31/17 1723  . magnesium hydroxide (MILK OF MAGNESIA) suspension 30 mL  30 mL Oral Daily PRN Pucilowska, Jolanta B, MD      . nicotine (NICODERM CQ - dosed in  mg/24 hours) patch 21 mg  21 mg Transdermal Q0600 Pucilowska, Jolanta B, MD   21 mg at 01/31/17 0926  . Oxcarbazepine (TRILEPTAL) tablet 300 mg  300 mg Oral BID Pucilowska, Jolanta B, MD   300 mg at 01/31/17 1654  . traZODone (DESYREL) tablet 100 mg  100 mg Oral QHS Pucilowska, Jolanta B, MD   100 mg at 01/30/17 2116    Lab Results:  Results for orders placed or performed during the hospital encounter of 01/28/17 (from the past 48 hour(s))  Hemoglobin A1c     Status: Abnormal   Collection Time: 01/30/17  6:43 PM  Result Value Ref Range   Hgb A1c MFr Bld 6.3 (H) 4.8 - 5.6 %    Comment: (NOTE) Pre diabetes:          5.7%-6.4% Diabetes:              >6.4% Glycemic control for   <7.0% adults with diabetes    Mean Plasma Glucose 134.11 mg/dL    Comment: Performed at [facility], [place] [address] [place], [place] [address]    Blood Alcohol level:  Lab Results  Component Value Date   [facility] <10 01/26/2017   ETH <5 62/37/6283    Metabolic Disorder Labs: Lab Results  Component Value Date   HGBA1C 6.3 (H) 01/30/2017   MPG 134.11 01/30/2017   MPG 128 05/11/2015   No results found for: PROLACTIN Lab Results  Component Value Date   CHOL 157 01/29/2017   TRIG 119 01/29/2017   HDL 40 (L) 01/29/2017   CHOLHDL 3.9 01/29/2017   VLDL 24 01/29/2017   LDLCALC 93 01/29/2017   LDLCALC 97 05/11/2015    Musculoskeletal: Strength & Muscle Tone: within normal limits Gait & Station: normal Patient leans: N/A  Psychiatric Specialty Exam: Physical Exam  ROS  Blood pressure (!) 116/94, pulse 75, temperature 98.7 F (37.1 C), temperature source Oral, resp. rate 20, height 5\' 4"  (1.626 m), weight 112 kg (247 lb), last menstrual period 01/03/2017, SpO2 99 %.Body mass index is 42.4 kg/m.  General Appearance: Fairly Groomed  Eye Contact:  Poor  Speech:  Normal Rate  Volume:  Normal  Mood:  Dysphoric  Affect:  Appears bright, smiling  Thought Process:  Linear  Orientation:  Full (Time,  Place, and Person)  Thought Content:  Delusions and Hallucinations: Auditory, paranoid  Suicidal Thoughts:  No, denies SI  Homicidal Thoughts:  No  Memory:  NA  Judgement:  Impaired  Insight:  Poor  Psychomotor Activity:  Normal  AIMS (if indicated):       ADL's:  Intact  Cognition:  WNL  Sleep:  Number of Hours:  7.15     Treatment Plan Summary: Daily contact with patient to assess and evaluate symptoms and progress in treatment  Ms. Gaubert is a 46 year old female with history of schizoaffective disorder admitted for worsening of psychosis with auditory command hallucinations and paranoia in the context of treatment noncompliance. Currently endorses active auditory hallucinations and appears very paranoid.  Feels that the medication regimen that she is on is working Counselling psychologist), has not been able to tolerate multiple antipsychotics in the past.  1. Psychosis. The patient was already given Haldol Decanoate injection at [facility] emergency room. Continue Fanapt 8 mg BID and Trileptal for mood stabilization  2. EPS. Continue Amantadine 100 mg twice daily.  3. Insomnia. Trazodone is available.  4. Smoking. Nicotine patch is available.  5. Metabolic syndrome monitoring.A1c stable from last year. TSH and lipid panel are within normal limits  6. EKG. QTc is 415 ms  7. Pregnancy test. negative  8. Disposition. The patient will be discharged to her apartment. She will follow up with [facility] ACT team.  Marylin Crosby, MD 01/31/2017, 5:39 PM

## 2017-01-31 NOTE — BHH Group Notes (Signed)
[facility] LCSW Group Therapy Note  Date/Time: 01/31/17, 1300  Type of Therapy and Topic:  Group Therapy:  Overcoming Obstacles  Participation Level:  Did not attend  Description of Group:    In this group patients will be encouraged to explore what they see as obstacles to their own wellness and recovery. They will be guided to discuss their thoughts, feelings, and behaviors related to these obstacles. The group will process together ways to cope with barriers, with attention given to specific choices patients can make. Each patient will be challenged to identify changes they are motivated to make in order to overcome their obstacles. This group will be process-oriented, with patients participating in exploration of their own experiences as well as giving and receiving support and challenge from other group members.  Therapeutic Goals: 1. Patient will identify personal and current obstacles as they relate to admission. 2. Patient will identify barriers that currently interfere with their wellness or overcoming obstacles.  3. Patient will identify feelings, thought process and behaviors related to these barriers. 4. Patient will identify two changes they are willing to make to overcome these obstacles:    Summary of Patient Progress      Therapeutic Modalities:   Cognitive Behavioral Therapy Solution Focused Therapy Motivational Interviewing Relapse Prevention Therapy  Lurline Idol, LCSW

## 2017-01-31 NOTE — BHH Group Notes (Signed)
[place] Group Notes:  (Nursing/MHT/Case Management/Adjunct)  Date:  01/31/2017  Time:  11:11 PM  Type of Therapy:  Group Therapy  Participation Level:  Active  Participation Quality:  Appropriate  Affect:  Appropriate  Cognitive:  Alert  Insight:  Good  Engagement in Group:  Engaged  Modes of Intervention:  Support  Summary of Progress/Problems:  [name] 01/31/2017, 11:11 PM

## 2017-01-31 NOTE — Tx Team (Signed)
Interdisciplinary Treatment and Diagnostic Plan Update  01/31/2017 Time of Session: [place] MRN: [id number]  Principal Diagnosis: Schizoaffective disorder, bipolar type [facility])  Secondary Diagnoses: Principal Problem:   Schizoaffective disorder, bipolar type ([place]) Active Problems:   Tobacco use disorder   Current Medications:  Current Facility-Administered Medications  Medication Dose Route Frequency Provider Last Rate Last Dose  . alum & mag hydroxide-simeth (MAALOX/MYLANTA) 200-200-20 MG/5ML suspension 30 mL  30 mL Oral Q4H PRN Pucilowska, Jolanta B, MD      . amantadine (SYMMETREL) capsule 100 mg  100 mg Oral BID Pucilowska, Jolanta B, MD   100 mg at 01/31/17 0924  . [START ON 02/24/2017] haloperidol decanoate (HALDOL DECANOATE) 100 MG/ML injection 50 mg  50 mg Intramuscular Q28 days Pucilowska, Jolanta B, MD      . iloperidone (FANAPT) tablet 8 mg  8 mg Oral BID Pucilowska, Jolanta B, MD   8 mg at 01/31/17 0925  . magnesium hydroxide (MILK OF MAGNESIA) suspension 30 mL  30 mL Oral Daily PRN Pucilowska, Jolanta B, MD      . nicotine (NICODERM CQ - dosed in mg/24 hours) patch 21 mg  21 mg Transdermal Q0600 Pucilowska, Jolanta B, MD   21 mg at 01/31/17 0926  . Oxcarbazepine (TRILEPTAL) tablet 300 mg  300 mg Oral BID Pucilowska, Jolanta B, MD   300 mg at 01/31/17 0924  . traZODone (DESYREL) tablet 100 mg  100 mg Oral QHS Pucilowska, Jolanta B, MD   100 mg at 01/30/17 2116   PTA Medications: Prescriptions Prior to Admission  Medication Sig Dispense Refill Last Dose  . haloperidol (HALDOL) 5 MG tablet Take 1 tablet (5 mg total) by mouth 2 (two) times daily. (Patient not taking: Reported on 11/09/2015) 60 tablet 0 Not Taking at Unknown time  . INGREZZA 80 MG CAPS Take 1 capsule by mouth daily.  11 Past Month at Unknown time    Patient Stressors: Financial difficulties Medication change or noncompliance  Patient Strengths: Ability for insight Average or above average  intelligence Capable of independent living Communication skills  Treatment Modalities: Medication Management, Group therapy, Case management,  1 to 1 session with clinician, Psychoeducation, Recreational therapy.   Physician Treatment Plan for Primary Diagnosis: Schizoaffective disorder, bipolar type ([place]) Long Term Goal(s): Improvement in symptoms so as ready for discharge NA   Short Term Goals: Ability to identify changes in lifestyle to reduce recurrence of condition will improve Ability to verbalize feelings will improve Ability to disclose and discuss suicidal ideas Ability to demonstrate self-control will improve Ability to identify and develop effective coping behaviors will improve Ability to maintain clinical measurements within normal limits will improve Ability to identify triggers associated with substance abuse/mental health issues will improve NA  Medication Management: Evaluate patient's response, side effects, and tolerance of medication regimen.  Therapeutic Interventions: 1 to 1 sessions, Unit Group sessions and Medication administration.  Evaluation of Outcomes: Progressing  Physician Treatment Plan for Secondary Diagnosis: Principal Problem:   Schizoaffective disorder, bipolar type ([place]) Active Problems:   Tobacco use disorder  Long Term Goal(s): Improvement in symptoms so as ready for discharge NA   Short Term Goals: Ability to identify changes in lifestyle to reduce recurrence of condition will improve Ability to verbalize feelings will improve Ability to disclose and discuss suicidal ideas Ability to demonstrate self-control will improve Ability to identify and develop effective coping behaviors will improve Ability to maintain clinical measurements within normal limits will improve Ability to identify triggers associated  with substance abuse/mental health issues will improve NA     Medication Management: Evaluate patient's response, side effects,  and tolerance of medication regimen.  Therapeutic Interventions: 1 to 1 sessions, Unit Group sessions and Medication administration.  Evaluation of Outcomes: Progressing   RN Treatment Plan for Primary Diagnosis: Schizoaffective disorder, bipolar type ([place]) Long Term Goal(s): Knowledge of disease and therapeutic regimen to maintain health will improve  Short Term Goals: Ability to verbalize feelings will improve, Ability to identify and develop effective coping behaviors will improve and Compliance with prescribed medications will improve  Medication Management: RN will administer medications as ordered by provider, will assess and evaluate patient's response and provide education to patient for prescribed medication. RN will report any adverse and/or side effects to prescribing provider.  Therapeutic Interventions: 1 on 1 counseling sessions, Psychoeducation, Medication administration, Evaluate responses to treatment, Monitor vital signs and CBGs as ordered, Perform/monitor CIWA, COWS, AIMS and Fall Risk screenings as ordered, Perform wound care treatments as ordered.  Evaluation of Outcomes: Progressing   LCSW Treatment Plan for Primary Diagnosis: Schizoaffective disorder, bipolar type ([place]) Long Term Goal(s): Safe transition to appropriate next level of care at discharge, Engage patient in therapeutic group addressing interpersonal concerns.  Short Term Goals: Engage patient in aftercare planning with referrals and resources, Increase social support and Increase skills for wellness and recovery  Therapeutic Interventions: Assess for all discharge needs, 1 to 1 time with Social worker, Explore available resources and support systems, Assess for adequacy in community support network, Educate family and significant other(s) on suicide prevention, Complete Psychosocial Assessment, Interpersonal group therapy.  Evaluation of Outcomes: Progressing   Progress in Treatment: Attending groups:  Yes. Participating in groups: Yes. Taking medication as prescribed: Yes. Toleration medication: Yes. Family/Significant other contact made: No, will contact:  pt refused Patient understands diagnosis: Yes. Discussing patient identified problems/goals with staff: Yes. Medical problems stabilized or resolved: Yes. Denies suicidal/homicidal ideation: Yes. Issues/concerns per patient self-inventory: No. Other: none  New problem(s) identified: No, Describe:  none  New Short Term/Long Term Goal(s): Pt goal: for the voices and hallucinations to be gone.  Discharge Plan or Barriers: Continue services with [facility].  Reason for Continuation of Hospitalization: Hallucinations Medication stabilization  Estimated Length of Stay: 5-7 days.  Attendees: Patient:[name] 01/31/2017   Physician: Dr. Bary Leriche, MD 01/31/2017   Nursing: Tyler Pita, RN 01/31/2017   RN Care Manager: 01/31/2017   Social Worker: Lurline Idol, LCSW 01/31/2017   Recreational Therapist:  01/31/2017   Other:  01/31/2017   Other:  01/31/2017   Other: 01/31/2017     Scribe for Treatment Team: Joanne Chars, [place] 01/31/2017 11:41 AM

## 2017-02-01 MED ORDER — ILOPERIDONE 4 MG PO TABS
10.0000 mg | ORAL_TABLET | Freq: Two times a day (BID) | ORAL | Status: DC
  Administered 2017-02-01 – 2017-02-04 (×6): 10 mg via ORAL
  Filled 2017-02-01 (×6): qty 3

## 2017-02-01 NOTE — BHH Group Notes (Signed)
[place] Group Notes:  (Nursing/MHT/Case Management/Adjunct)  Date:  02/01/2017  Time:  7:04 PM  Type of Therapy:  Psychoeducational Skills  Participation Level:  Did Not Attend  Summary of Progress/Problems:  [name] 02/01/2017, 7:04 PM

## 2017-02-01 NOTE — Progress Notes (Signed)
Patient ID: [name], female   DOB: [birth year], 46 y.o.   MRN: [id number] Flat and blunted affect, "I don't want to talk, I want to watch the movie...." said the patient, when I asked to introduce myself; she was left alone, did not say much during medication @ bedtime; requested for a Bible before retiring to bed.

## 2017-02-01 NOTE — BHH Group Notes (Signed)
[place] LCSW Group Therapy Note  Date/Time: 02/01/17, 1500  Type of Therapy/Topic:  Group Therapy:  Feelings about Diagnosis  Participation Level:  Did Not Attend   Mood:   Description of Group:    This group will allow patients to explore their thoughts and feelings about diagnoses they have received. Patients will be guided to explore their level of understanding and acceptance of these diagnoses. Facilitator will encourage patients to process their thoughts and feelings about the reactions of others to their diagnosis, and will guide patients in identifying ways to discuss their diagnosis with significant others in their lives. This group will be process-oriented, with patients participating in exploration of their own experiences as well as giving and receiving support and challenge from other group members.   Therapeutic Goals: 1. Patient will demonstrate understanding of diagnosis as evidence by identifying two or more symptoms of the disorder:  2. Patient will be able to express two feelings regarding the diagnosis 3. Patient will demonstrate ability to communicate their needs through discussion and/or role plays  Summary of Patient Progress:        Therapeutic Modalities:   Cognitive Behavioral Therapy Brief Therapy Feelings Identification   Lurline Idol, LCSW

## 2017-02-01 NOTE — Progress Notes (Addendum)
[facility] MD Progress Note  02/01/2017 4:01 PM [name]  MRN:  [id number] Subjective:  Pt is very irritable today towards this Probation officer. She still presents with paranoia about this provider not being a psychiatrists. 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 She states that she did not sleep well last night and is catching up on sleep. She states that she is disappointed because she is not making progress "immediately and this shouldn't be like this." She is tangential and disorganized in speech at times. She states, "You just don't understand. You don't hear voices like I do." She refuses to elaborate on voices. She has decreased latencies in responses compared to yesterday and does not appear to be responding to internal stimuli at this time. Per RN report, she verbalized that the devil was talking to her last night. She was observed laughing and talking to herself.   Principal Problem: Schizoaffective disorder, bipolar type ([place]) Diagnosis:   Patient Active Problem List   Diagnosis Date Noted  . No diagnosis on Axis I [Z03.89] 08/21/2015  . Abnormal behavior [R46.89]   . Involuntary commitment [Z04.6]   . Schizophrenia ([place]) [F20.9]   . Disturbance in affect [R45.89]   . Tobacco use disorder [F17.200] 05/12/2015  . Cannabis use disorder, moderate, dependence ([place]) [F12.20] 05/12/2015  . Hypertension [I10] 02/24/2011  . ANEMIA-IRON DEFICIENCY [D50.9] 12/13/2006  . GERD [K21.9] 12/13/2006  . OBESITY [E66.9] 12/12/2006  . Schizoaffective disorder, bipolar type ([place]) [F25.0] 12/12/2006  . HEMORRHOIDS, NOS [K64.9] 12/12/2006  . PAIN-NECK [M54.2] 12/12/2006  . TREMOR [R25.9] 12/12/2006   Total Time spent with patient: 20 minutes  Past Psychiatric History: The patient was diagnosed with schizoaffective disorder at the age of 46. She  has been hospitalized multiple times. She denies suicide attempts. She has had multiple medication trials including all antipsychotics that I can name. She was tried on lithium and Depakote and does not believe they are helpful. She does not remember Tegretol, Trileptal, Topamax.  Past Medical History:  Past Medical History:  Diagnosis Date  . Arthritis   . Bipolar 1 disorder ([place])   . Bronchitis   . Depression   . Hemorrhoid   . Schizoaffective disorder ([place])   . Schizophrenia ([place])    Pt denies and reports it is schizoaffective disorder.   . Transfusion history     Past Surgical History:  Procedure Laterality Date  . CESAREAN SECTION     Family History:  Family History  Problem Relation Age of Onset  . Diabetes type II Other    Family Psychiatric  History: None Social History:  History  Alcohol Use No     History  Drug Use No    Comment: Former user    Social History   Social History  . Marital status: Legally Separated    Spouse name: N/A  . Number of children: N/A  . Years of education: N/A   Social History Main Topics  . Smoking status: Current Every Day Smoker    Packs/day: 0.50    Years: 17.00    Types: Cigarettes  . Smokeless tobacco: Never Used  . Alcohol use No  . Drug use: No     Comment: Former user  . Sexual activity: No   Other Topics Concern  . None   Social History Narrative   **  Merged History Encounter **                              Sleep: Poor  Appetite:  Fair  Current Medications: Current Facility-Administered Medications  Medication Dose Route Frequency Provider Last Rate Last Dose  . alum & mag hydroxide-simeth (MAALOX/MYLANTA) 200-200-20 MG/5ML suspension 30 mL  30 mL Oral Q4H PRN Pucilowska, Jolanta B, MD      . amantadine (SYMMETREL) capsule 100 mg  100 mg Oral BID Pucilowska, Jolanta B, MD   100 mg at 02/01/17 0852  . [START ON 02/24/2017] haloperidol decanoate (HALDOL DECANOATE) 100 MG/ML injection 50 mg  50 mg  Intramuscular Q28 days Pucilowska, Jolanta B, MD      . iloperidone (FANAPT) tablet 10 mg  10 mg Oral BID [place] R, MD      . magnesium hydroxide (MILK OF MAGNESIA) suspension 30 mL  30 mL Oral Daily PRN Pucilowska, Jolanta B, MD      . nicotine (NICODERM CQ - dosed in mg/24 hours) patch 21 mg  21 mg Transdermal Q0600 Pucilowska, Jolanta B, MD   21 mg at 02/01/17 0852  . Oxcarbazepine (TRILEPTAL) tablet 300 mg  300 mg Oral BID Pucilowska, Jolanta B, MD   300 mg at 02/01/17 0852  . traZODone (DESYREL) tablet 100 mg  100 mg Oral QHS Pucilowska, Jolanta B, MD   100 mg at 01/31/17 2138    Lab Results:  Results for orders placed or performed during the hospital encounter of 01/28/17 (from the past 48 hour(s))  Hemoglobin A1c     Status: Abnormal   Collection Time: 01/30/17  6:43 PM  Result Value Ref Range   Hgb A1c MFr Bld 6.3 (H) 4.8 - 5.6 %    Comment: (NOTE) Pre diabetes:          5.7%-6.4% Diabetes:              >6.4% Glycemic control for   <7.0% adults with diabetes    Mean Plasma Glucose 134.11 mg/dL    Comment: Performed at [facility], [place] [address], [place], [place] [address]    Blood Alcohol level:  Lab Results  Component Value Date   [facility] <10 01/26/2017   ETH <5 73/71/0626    Metabolic Disorder Labs: Lab Results  Component Value Date   HGBA1C 6.3 (H) 01/30/2017   MPG 134.11 01/30/2017   MPG 128 05/11/2015   No results found for: PROLACTIN Lab Results  Component Value Date   CHOL 157 01/29/2017   TRIG 119 01/29/2017   HDL 40 (L) 01/29/2017   CHOLHDL 3.9 01/29/2017   VLDL 24 01/29/2017   LDLCALC 93 01/29/2017   LDLCALC 97 05/11/2015    Musculoskeletal: Strength & Muscle Tone: within normal limits Gait & Station: normal   Psychiatric Specialty Exam: Physical Exam  ROS  Blood pressure 123/66, pulse 98, temperature 98.5 F (36.9 C), temperature source Oral, resp. rate 20, height 5\' 4"  (1.626 m), weight 112 kg (247 lb), last menstrual period  01/03/2017, SpO2 99 %.Body mass index is 42.4 kg/m.  General Appearance: Fairly Groomed  Eye Contact:  Poor  Speech:  Slow  Volume:  Increased  Mood:  Dysphoric  Affect:  Flat  Thought Process:  Disorganized  Orientation:  Full (Time, Place, and Person)  Thought Content:  Hallucinations: Auditory  Suicidal Thoughts:  No  Homicidal Thoughts:  No  Memory:  Immediate;  Poor  Judgement:  Poor  Insight:  Lacking  Psychomotor Activity:  Normal  Concentration:  Concentration: Poor  Recall:  Poor  Fund of Knowledge:  Fair  Language:  Fair  Akathisia:  No      Assets:  Desire for Improvement Housing  ADL's:  Intact  Cognition:  WNL  Sleep:  Number of Hours: 3     Treatment Plan Summary: [name] is a 46 year old female with history of schizoaffective disorder admitted for worsening of psychosis with auditory command hallucinations and paranoia in the context of treatment noncompliance. Currently endorses active auditory hallucinations and appears very paranoid. She is very guarded with this provider.  Feels that the medication regimen that she is on is working Counselling psychologist), has not been able to tolerate multiple antipsychotics in the past.  1. Psychosis. The patient was already given Haldol Decanoate injection at [facility] emergency room. Increase Fanapt to 10 mg BID  2. EPS. Continue Amantadine 100 mg twice daily.  3. Insomnia. Trazodone is available.  4. Smoking. Nicotine patch is available.  5. Metabolic syndrome monitoring.A1c stable from last year. TSH and lipid panel are within normal limits  6. EKG. QTc is 415 ms  7. Pregnancy test. negative  8. Disposition. The patient will be discharged to her apartment. [facility]. Will connect with ACT team tomorrow for further collateral.   Marylin Crosby, MD 02/01/2017, 4:01 PM

## 2017-02-01 NOTE — Plan of Care (Signed)
Problem: Education: Goal: Emotional status will improve Outcome: Progressing Pt emotional status with improve for the next 3 days.

## 2017-02-01 NOTE — Progress Notes (Signed)
Pt is very pleasant. Alert and oriented x 4. Pt has come out of room and went to wrap up group. Denies SI, HI or A/V hallucinations. Contracted to safety. Med compliant, no adverse affects noted. No s/s of distress. Pt has been more assertive. 15 min safety checks continues.

## 2017-02-01 NOTE — Progress Notes (Signed)
D: Affect pleasant on approach . Limited interaction with her peers and staff. Appetite good and voice no concerns  Around sleep. Appropriate  hygiene , and personal chores . Patient quiet  And Isolative.  No unit participation  Compliant  With medication .  Voice no concerns around going home . Denies  Suicidal  Ideations . Voice no pain  A: Encourage patient participation with unit programming . Instruction  Given on  Medication , verbalize understanding. R: Voice no other concerns. Staff continue to monitor

## 2017-02-01 NOTE — Plan of Care (Signed)
Problem: Activity: Goal: Sleeping patterns will improve Outcome: Progressing Patient slept for Estimated Hours of 3; Precautionary checks every 15 minutes for safety maintained, room free of safety hazards, patient sustains no injury or falls during this shift.

## 2017-02-02 MED ORDER — ACETAMINOPHEN 325 MG PO TABS
650.0000 mg | ORAL_TABLET | Freq: Four times a day (QID) | ORAL | Status: DC | PRN
  Administered 2017-02-02: 650 mg via ORAL
  Filled 2017-02-02: qty 2

## 2017-02-02 MED ORDER — ALBUTEROL SULFATE HFA 108 (90 BASE) MCG/ACT IN AERS
1.0000 | INHALATION_SPRAY | Freq: Every day | RESPIRATORY_TRACT | Status: DC | PRN
  Administered 2017-02-02 – 2017-02-03 (×2): 1 via RESPIRATORY_TRACT
  Filled 2017-02-02 (×2): qty 6.7

## 2017-02-02 MED ORDER — NAPROXEN 250 MG PO TABS
250.0000 mg | ORAL_TABLET | Freq: Two times a day (BID) | ORAL | Status: DC | PRN
  Administered 2017-02-02: 250 mg via ORAL
  Filled 2017-02-02 (×2): qty 1

## 2017-02-02 MED ORDER — IBUPROFEN 600 MG PO TABS: 600.0000 mg | ORAL_TABLET | Freq: Four times a day (QID) | ORAL | Status: DC | PRN

## 2017-02-02 NOTE — Plan of Care (Signed)
Problem: Spiritual Needs Goal: Ability to function at adequate level Outcome: Progressing Patient has demonstrated the ability to function at an adequate level.  Problem: Education: Goal: Emotional status will improve Outcome: Progressing Patient's emotional status has improved. Goal: Verbalization of understanding the information provided will improve Outcome: Progressing Patient has verbalized understanding the information provided with her.  Problem: Coping: Goal: Ability to verbalize frustrations and anger appropriately will improve Outcome: Progressing Patient has demonstrated the ability to verbalize her frustrations to staff.  Problem: Education: Goal: Ability to make informed decisions regarding treatment will improve Outcome: Progressing Patient has demonstrated the ability to make informed decisions regarding treatment.

## 2017-02-02 NOTE — Progress Notes (Signed)
Pt denies SI/HI/AVH and is able to contract for safety. Up ambulating throughout shift with a steady gait. Patient is pleasant and cooperative with treatment care. Patient complained of abdominal cramping this morning and generalized pain from arthritis. MD notified of concerns from patient. Patient is compliant with her medication regimen. Milieu remains safe with q 15 minute checks.

## 2017-02-02 NOTE — BHH Group Notes (Signed)
[place] Group Notes:  (Nursing/MHT/Case Management/Adjunct)  Date:  02/02/2017  Time:  10:30 AM  Type of Therapy:  Psychoeducational Skills  Participation Level:  Did Not Attend  Summary of Progress/Problems:  [name] 02/02/2017, 10:30 AM

## 2017-02-02 NOTE — BHH Group Notes (Signed)
LCSW Group Therapy Note  02/02/2017 1:00am  Type of Therapy/Topic:  Group Therapy:  Emotion Regulation  Participation Level:  Did Not Attend   Description of Group:   The purpose of this group is to assist patients in learning to regulate negative emotions and experience positive emotions. Patients will be guided to discuss ways in which they have been vulnerable to their negative emotions. These vulnerabilities will be juxtaposed with experiences of positive emotions or situations, and patients will be challenged to use positive emotions to combat negative ones. Special emphasis will be placed on coping with negative emotions in conflict situations, and patients will process healthy conflict resolution skills.  Therapeutic Goals: 1. Patient will identify two positive emotions or experiences to reflect on in order to balance out negative emotions 2. Patient will label two or more emotions that they find the most difficult to experience 3. Patient will demonstrate positive conflict resolution skills through discussion and/or role plays  Summary of Patient Progress:       Therapeutic Modalities:   Cognitive Behavioral Therapy Feelings Identification Dialectical Behavioral Therapy   August Saucer, LCSW 02/02/2017 2:25 PM
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
## 2017-02-02 NOTE — BHH Group Notes (Signed)
[facility] [facility] Group Notes:  (Nursing/MHT/Case Management/Adjunct)  Date:  02/02/2017  Time:  12:28 AM  Type of Therapy:  Wrap-Up Group  Participation Level:  Active  Participation Quality:  Appropriate  Affect:  Appropriate  Cognitive:  Appropriate  Insight:  Appropriate  Engagement in Group:  Engaged  Modes of Intervention:  Discussion, Socialization and Support  Summary of Progress/Problems:  Adela Lank Niall Illes 02/02/2017, 12:28 AM

## 2017-02-03 MED ORDER — ILOPERIDONE 10 MG PO TABS: 10.0000 mg | ORAL_TABLET | Freq: Two times a day (BID) | ORAL | 0 refills | Status: DC

## 2017-02-03 MED ORDER — HALOPERIDOL DECANOATE 100 MG/ML IM SOLN: 50.0000 mg | INTRAMUSCULAR | 0 refills | Status: DC

## 2017-02-03 MED ORDER — TRAZODONE HCL 100 MG PO TABS: 100.0000 mg | ORAL_TABLET | Freq: Every day | ORAL | 0 refills | Status: DC

## 2017-02-03 MED ORDER — AMANTADINE HCL 100 MG PO CAPS: 100.0000 mg | ORAL_CAPSULE | Freq: Two times a day (BID) | ORAL | 0 refills | Status: DC

## 2017-02-03 MED ORDER — OXCARBAZEPINE 300 MG PO TABS: 300.0000 mg | ORAL_TABLET | Freq: Two times a day (BID) | ORAL | 0 refills | Status: DC

## 2017-02-03 NOTE — Progress Notes (Signed)
  [facility] Adult Case Management Discharge Plan :  Will you be returning to the same living situation after discharge:  Yes,  own apartment At discharge, do you have transportation home?: Yes,  ACT team Do you have the ability to pay for your medications: Yes,  medicaid  Release of information consent forms completed and in the chart;  Patient's signature needed at discharge.  Patient to Follow up at: Follow-up Information    [facility] Follow up on 02/04/2017.   Why:  Your ACT team will pick you up from the hospital on Friday, 02/04/17, and resume services immediately. Contact information: [place] [address] [phone]           Next level of care provider has access to [place] and Suicide Prevention discussed: No. Pt declined.  Have you used any form of tobacco in the last 30 days? (Cigarettes, Smokeless Tobacco, Cigars, and/or Pipes): Yes  Has patient been referred to the Quitline?: Patient refused referral  Patient has been referred for addiction treatment: Yes  Joanne Chars, [place] 02/03/2017, 2:09 PM

## 2017-02-03 NOTE — Plan of Care (Signed)
Problem: Activity: Goal: Sleeping patterns will improve Outcome: Progressing Patient slept for Estimated Hours of 7.30; Precautionary checks every 15 minutes for safety maintained, room free of safety hazards, patient sustains no injury or falls during this shift.    

## 2017-02-03 NOTE — BHH Group Notes (Signed)
[facility] Group Notes:  (Nursing/MHT/Case Management/Adjunct)  Date:  02/03/2017  Time:  9:16 PM  Type of Therapy:  Group Therapy  Participation Level:  Active  Participation Quality:  Appropriate  Affect:  Appropriate  Cognitive:  Appropriate  Insight:  Good  Engagement in Group:  Supportive  Modes of Intervention:  Support  Summary of Progress/Problems:  [name] 02/03/2017, 9:16 PM

## 2017-02-03 NOTE — Progress Notes (Signed)
Patient sleeps off and on most of the day. She is med compliant and pleasant. She comes out for meals and meds. She denies si, hi, avh. No pain. Plan for dc tomorrow.

## 2017-02-03 NOTE — BHH Suicide Risk Assessment (Addendum)
[facility] Admission Suicide Risk Assessment   Nursing information obtained from:   patient, chart Current Mental Status:   Much improved Loss Factors:   None Historical Factors:   Medication noncompliance Risk Reduction Factors:   ACT team  Total Time spent with patient: 20 minutes Principal Problem: Schizoaffective disorder, bipolar type ([place]) Diagnosis:   Patient Active Problem List   Diagnosis Date Noted  . No diagnosis on Axis I [Z03.89] 08/21/2015  . Abnormal behavior [R46.89]   . Involuntary commitment [Z04.6]   . Schizophrenia ([place]) [F20.9]   . Disturbance in affect [R45.89]   . Tobacco use disorder [F17.200] 05/12/2015  . Cannabis use disorder, moderate, dependence ([place]) [F12.20] 05/12/2015  . Hypertension [I10] 02/24/2011  . ANEMIA-IRON DEFICIENCY [D50.9] 12/13/2006  . GERD [K21.9] 12/13/2006  . OBESITY [E66.9] 12/12/2006  . Schizoaffective disorder, bipolar type ([place]) [F25.0] 12/12/2006  . HEMORRHOIDS, NOS [K64.9] 12/12/2006  . PAIN-NECK [M54.2] 12/12/2006  . TREMOR [R25.9] 12/12/2006   Subjective Data: Paranoia and voices have greatly improved. She is much more interactive with this provider and staff. She is not responding to internal stimuli. She is able to hold a rational conversation. She likes her new ACT team and feels very supported by them. She denies SI or HI. AH have greatly diminished. She denies command AH.   Continued Clinical Symptoms:  Alcohol Use Disorder Identification Test Final Score (AUDIT): 0 The "Alcohol Use Disorders Identification Test", Guidelines for Use in Primary Care, Second Edition.  World Pharmacologist [facility]). Score between 0-7:  no or low risk or alcohol related problems. Score between 8-15:  moderate risk of alcohol related problems. Score between 16-19:  high risk of alcohol related problems. Score 20 or above:  warrants further diagnostic evaluation for alcohol dependence and treatment.   CLINICAL FACTORS:   Medication non  compliance at home   Musculoskeletal: Strength & Muscle Tone: within normal limits Gait & Station: normal Patient leans: N/A  Psychiatric Specialty Exam: Physical Exam  Nursing note and vitals reviewed.   Review of Systems  All other systems reviewed and are negative.   Blood pressure (!) 105/42, pulse 86, temperature 97.8 F (36.6 C), temperature source Oral, resp. rate 20, height 5\' 4"  (1.626 m), weight 112 kg (247 lb), SpO2 99 %.Body mass index is 42.4 kg/m.                                                       General Appearance: Casual  Eye Contact:  Good  Speech:  Clear and Coherent  Volume:  Normal  Mood:  Euthymic  Affect:  Congruent  Thought Process:  Goal Directed  Orientation:  Full (Time, Place, and Person)  Thought Content:  Hallucinations: Auditory but greatly improved  Suicidal Thoughts:  No  Homicidal Thoughts:  No  Memory:  Immediate;   Good  Judgement:  Fair  Insight:  Fair  Psychomotor Activity:  Normal  Concentration:  Concentration: Fair  Recall:  [employer] of Knowledge:  Fair  Language:  Fair  Akathisia:  No    AIMS (if indicated):    0  Assets:  [employer] logistics/support/administrative officer Social Support  ADL's:  Intact  Cognition:  WNL  Sleep:  Number of Hours: 7.3       COGNITIVE FEATURES THAT CONTRIBUTE TO RISK:  None  SUICIDE RISK:   Minimal: No identifiable suicidal ideation.  Patients presenting with no risk factors but with morbid ruminations; may be classified as minimal risk based on the severity of the depressive symptoms  PLAN OF CARE:  -Discharge tomorrow 02/04/2017. Her ACT team will pick her up.  I certify that inpatient services furnished can reasonably be expected to improve the patient's condition.   Marylin Crosby, MD 02/03/2017, 2:24 PM

## 2017-02-03 NOTE — Progress Notes (Signed)
Patient ID: [name], female   DOB: 1970/06/15, 46 y.o.   MRN: [id number] Quiet, withdrawn to self, limited interaction with peers, INH given for SOB, early medication given, denied SI/HI/AVH.

## 2017-02-03 NOTE — Discharge Summary (Addendum)
Physician Discharge Summary Note  Patient:  [name] is an 46 y.o., female MRN:  [id number] DOB:  1970-12-17 Patient phone:  [phone] (home)  Patient address:   [place] [address],  Total Time spent with patient: 20 minutes  Date of Admission:  01/28/2017 Date of Discharge: 02/04/17  Reason for Admission:  Worsening auditory hallucinations and decompensation of psychosis  Principal Problem: Schizoaffective disorder, bipolar type [facility]) Discharge Diagnoses: Patient Active Problem List   Diagnosis Date Noted  . No diagnosis on Axis I [Z03.89] 08/21/2015  . Abnormal behavior [R46.89]   . Involuntary commitment [Z04.6]   . Schizophrenia ([place]) [F20.9]   . Disturbance in affect [R45.89]   . Tobacco use disorder [F17.200] 05/12/2015  . Cannabis use disorder, moderate, dependence ([place]) [F12.20] 05/12/2015  . Hypertension [I10] 02/24/2011  . ANEMIA-IRON DEFICIENCY [D50.9] 12/13/2006  . GERD [K21.9] 12/13/2006  . OBESITY [E66.9] 12/12/2006  . Schizoaffective disorder, bipolar type ([place]) [F25.0] 12/12/2006  . HEMORRHOIDS, NOS [K64.9] 12/12/2006  . PAIN-NECK [M54.2] 12/12/2006  . TREMOR [R25.9] 12/12/2006    Past Psychiatric History: The patient was diagnosed with schizoaffective disorder at the age of 32. She has been hospitalized multiple times. She denies suicide attempts. She has had multiple medication trials including all antipsychotics that I can name. She was tried on lithium and Depakote and does not believe they are helpful. She does not remember Tegretol, Trileptal, Topamax.  Past Medical History:  Past Medical History:  Diagnosis Date  . Arthritis   . Bipolar 1 disorder ([place])   . Bronchitis   . Depression   . Hemorrhoid   . Schizoaffective disorder ([place])   . Schizophrenia ([place])    Pt denies and reports it is schizoaffective disorder.   . Transfusion history     Past Surgical History:  Procedure Laterality Date  . CESAREAN  SECTION     Family History:  Family History  Problem Relation Age of Onset  . Diabetes type II Other    Family Psychiatric  History: None reported Social History:  History  Alcohol Use No     History  Drug Use No    Comment: Former user    Social History   Social History  . Marital status: Legally Separated    Spouse name: N/A  . Number of children: N/A  . Years of education: N/A   Social History Main Topics  . Smoking status: Current Every Day Smoker    Packs/day: 0.50    Years: 17.00    Types: Cigarettes  . Smokeless tobacco: Never Used  . Alcohol use No  . Drug use: No     Comment: Former user  . Sexual activity: No   Other Topics Concern  . None   Social History Narrative   ** Merged History Encounter **        Hospital Course:  Pt was transferred from [facility] ED where she presented for worsening AH. Pt was given Haldol Deconate injection while in the ED after consultation by psychiatry. She received 50 mg on 01/27/17. Pt was very unhappy about this because she states that she has side effects to Haldol. Pt did not experience any dystonia or EPS symptoms while on the unit and appeared to tolerate this medications however she is very ambivalent about continuing to get the injections. She prefers to be on [place]. She was started on Fanapt when she initially presented to the unit. This was titrated up to 10 mg  BID. She tolerated this medication well with no side effects. No episodes of hypotension. She was also started on low dose Trileptal on initial arrival however this was determined to not be needed (She was not displaying manic symptoms) at this time since she is on 2 anti-psychotics at this time. This was discontinued prior to discharge to avoid polypharmacy.  She was initially very irritable, psychotic, and argumentative. However, she cleared up significantly and by day of discharge she was bright, cheerful, organized, goal directed. She reported that the voices  have diminished a lot since admission. She denies any command hallucinations. She denied SI, HI, VH.  She was started on amantadine on admission for EPS prophylaxis. Lipid Panel, TSH were WNL. HgB A1C was stable at 6.3 (1 year ago 6.1). Pt follows with ACT team and enjoys this new team and feels supported by them.   Physical Findings: AIMS: Facial and Oral Movements Muscles of Facial Expression: None, normal Lips and Perioral Area: None, normal Jaw: None, normal Tongue: None, normal,Extremity Movements Upper (arms, wrists, hands, fingers): None, normal Lower (legs, knees, ankles, toes): None, normal, Trunk Movements Neck, shoulders, hips: None, normal, Overall Severity Severity of abnormal movements (highest score from questions above): None, normal Incapacitation due to abnormal movements: None, normal Patient's awareness of abnormal movements (rate only patient's report): No Awareness, Dental Status Current problems with teeth and/or dentures?: No Does patient usually wear dentures?: No     Musculoskeletal: Strength & Muscle Tone: within normal limits Gait & Station: normal Patient leans: N/A  Psychiatric Specialty Exam: Physical Exam  ROS  Blood pressure (!) 105/42, pulse 86, temperature 97.8 F (36.6 C), temperature source Oral, resp. rate 20, height 5\' 4"  (1.626 m), weight 112 kg (247 lb), SpO2 99 %.Body mass index is 42.4 kg/m.  General Appearance: Casual  Eye Contact:  Good  Speech:  Clear and Coherent  Volume:  Normal  Mood:  Euthymic  Affect:  Congruent  Thought Process:  Goal Directed  Orientation:  Full (Time, Place, and Person)  Thought Content:  Hallucinations: Auditory but greatly improved  Suicidal Thoughts:  No  Homicidal Thoughts:  No  Memory:  Immediate;   Good  Judgement:  Fair  Insight:  Fair  Psychomotor Activity:  Normal  Concentration:  Concentration: Fair  Recall:  [place] of Knowledge:  Fair  Language:  Fair  Akathisia:  No    AIMS (if  indicated):    0  Assets:  [employer]ed [employer] logistics/support/administrative officer Social Support  ADL's:  Intact  Cognition:  WNL  Sleep:  Number of Hours: 7.3     Have you used any form of tobacco in the last 30 days? (Cigarettes, Smokeless Tobacco, Cigars, and/or Pipes): Yes  Has this patient used any form of tobacco in the last 30 days? (Cigarettes, Smokeless Tobacco, Cigars, and/or Pipes) Yes, Yes, A prescription for an FDA-approved tobacco cessation medication was offered at discharge and the patient refused  Blood Alcohol level:  Lab Results  Component Value Date   [facility] <10 01/26/2017   ETH <5 50/38/8828    Metabolic Disorder Labs:  Lab Results  Component Value Date   HGBA1C 6.3 (H) 01/30/2017   MPG 134.11 01/30/2017   MPG 128 05/11/2015   No results found for: PROLACTIN Lab Results  Component Value Date   CHOL 157 01/29/2017   TRIG 119 01/29/2017   HDL 40 (L) 01/29/2017   CHOLHDL 3.9 01/29/2017   VLDL 24 01/29/2017   [place] 93 01/29/2017  [place] 97 05/11/2015    See Psychiatric Specialty Exam and Suicide Risk Assessment completed by Attending Physician prior to discharge.  Discharge destination:  Home  Is patient on multiple antipsychotic therapies at discharge:  Yes,   Do you recommend tapering to monotherapy for antipsychotics?  Yes , if possible, she has failed multiple antipsychotic. Ideally, continue Haldol Deconate however she is very ambivalent about this medication and only wants Fanapt. Has Patient had three or more failed trials of antipsychotic monotherapy by history:  Yes,   Antipsychotic medications that previously failed include:  Latuda, Prolixin, Zyprexa  Recommended Plan for Multiple Antipsychotic Therapies: Additional reason(s) for multiple antispychotic treatment:  She was given Haldol Dec in ED and tolerated this well. Pt is VERY ambivalant about this medications and my not want to contiue it. She is adament that she wants Fanapt  Discharge Instructions    Increase  activity slowly    Complete by:  As directed      Allergies as of 02/03/2017      Reactions   Latuda [lurasidone Hcl] Nausea And Vomiting, Other (See Comments)   Reaction:  Shaking    Abilify [aripiprazole] Other (See Comments)   shaking   Flagyl [metronidazole Hcl] Nausea And Vomiting   Prolixin [fluphenazine] Other (See Comments)   shaking   Risperidone And Related Other (See Comments)   shaking   Seroquel [quetiapine Fumarate]    Patient states she had a syncopal episode after taking medication.    Sulfa Antibiotics Nausea And Vomiting      Medication List    STOP taking these medications   haloperidol 5 MG tablet Commonly known as:  HALDOL   INGREZZA 80 MG Caps Generic drug:  Valbenazine Tosylate     TAKE these medications     Indication  amantadine 100 MG capsule Commonly known as:  SYMMETREL Take 1 capsule (100 mg total) by mouth 2 (two) times daily.  Indication:  Extrapyramidal Reaction caused by Medications   haloperidol decanoate 100 MG/ML injection Commonly known as:  HALDOL DECANOATE Inject 0.5 mLs (50 mg total) into the muscle every 28 (twenty-eight) days.  Indication:  Schizophrenia   Iloperidone 10 MG Tabs Commonly known as:  FANAPT Take 1 tablet (10 mg total) by mouth 2 (two) times daily.  Indication:  Schizophrenia   traZODone 100 MG tablet Commonly known as:  DESYREL Take 1 tablet (100 mg total) by mouth at bedtime.  Indication:  Trouble Sleeping      Follow-up Information    [facility] Follow up on 02/04/2017.   Why:  Your ACT team will pick you up from the hospital on Friday, 02/04/17, and resume services immediately. Contact information: [place] [address] [phone]           Follow-up recommendations:  Follow up with ACT team   Signed: Marylin Crosby, MD 02/03/2017, 2:13 PM

## 2017-02-03 NOTE — [facility] Group Notes (Signed)
LCSW Group Therapy Note  02/03/2017 1:15pm  Type of Therapy/Topic:  Group Therapy:  Balance in Life  Participation Level:  Did Not Attend  Description of Group:    This group will address the concept of balance and how it feels and looks when one is unbalanced. Patients will be encouraged to process areas in their lives that are out of balance and identify reasons for remaining unbalanced. Facilitators will guide patients in utilizing problem-solving interventions to address and correct the stressor making their life unbalanced. Understanding and applying boundaries will be explored and addressed for obtaining and maintaining a balanced life. Patients will be encouraged to explore ways to assertively make their unbalanced needs known to significant others in their lives, using other group members and facilitator for support and feedback.  Therapeutic Goals: 1. Patient will identify two or more emotions or situations they have that consume much of in their lives. 2. Patient will identify signs/triggers that life has become out of balance:  3. Patient will identify two ways to set boundaries in order to achieve balance in their lives:  4. Patient will demonstrate ability to communicate their needs through discussion and/or role plays  Summary of Patient Progress:      Therapeutic Modalities:   [facility] [place], LCSW 02/03/2017 3:22 PM

## 2017-02-03 NOTE — Progress Notes (Signed)
[facility] MD Progress Note  02/03/2017 11:26 AM [name]  MRN:  [id number]   Subjective:  Pt appears much less paraniod today. She is much more interactive and brighter affect today. She states that the voices are improving since being back on [place]. She statse that she "barely hears them." She adamanetly denies that they tell her to hurt herself or others. She denies SI or thoughts of self harm. Denies HI. She does not appear to be responding to internal stimuli which is much improved since I have met her. She no longer has latencies in responses. Se is very organized and goal directed. She states that she does nto go to groups because another peer bothers her. She is very appropriate in coversation. She discusses that she thinks her new ACT team is a good fit for her so far and feels that everyone is very suportive to her needs. She likes her aparatment but is going to see if she can find a better one. She is tolerating medciations well with no EPS symptoms. No stiffness or cogwheeling noted on exam today. She has been sleeping well and eating well and caring for her ADLs. She is fairly groomed today.   Principal Problem: Schizoaffective disorder, bipolar type ([place]) Diagnosis:   Patient Active Problem List   Diagnosis Date Noted  . No diagnosis on Axis I [Z03.89] 08/21/2015  . Abnormal behavior [R46.89]   . Involuntary commitment [Z04.6]   . Schizophrenia ([place]) [F20.9]   . Disturbance in affect [R45.89]   . Tobacco use disorder [F17.200] 05/12/2015  . Cannabis use disorder, moderate, dependence ([place]) [F12.20] 05/12/2015  . Hypertension [I10] 02/24/2011  . ANEMIA-IRON DEFICIENCY [D50.9] 12/13/2006  . GERD [K21.9] 12/13/2006  . OBESITY [E66.9] 12/12/2006  . Schizoaffective disorder, bipolar type ([place]) [F25.0] 12/12/2006  . HEMORRHOIDS, NOS [K64.9] 12/12/2006  . PAIN-NECK [M54.2] 12/12/2006  . TREMOR [R25.9] 12/12/2006   Total Time spent with patient: 20 minutes  Past Psychiatric  History: patient has had multiple inpatient hospitalizations from the time she was 46 years old. Has had allergies to multiple antipsychotics which include Abilify, Prolixin, Risperdal,Seroquel, Latuda.   Past Medical History:  Past Medical History:  Diagnosis Date  . Arthritis   . Bipolar 1 disorder ([place])   . Bronchitis   . Depression   . Hemorrhoid   . Schizoaffective disorder ([place])   . Schizophrenia ([place])    Pt denies and reports it is schizoaffective disorder.   . Transfusion history     Past Surgical History:  Procedure Laterality Date  . CESAREAN SECTION     Family History:  Family History  Problem Relation Age of Onset  . Diabetes type II Other    Social History:  History  Alcohol Use No     History  Drug Use No    Comment: Former user    Social History   Social History  . Marital status: Legally Separated    Spouse name: N/A  . Number of children: N/A  . Years of education: N/A   Social History Main Topics  . Smoking status: Current Every Day Smoker    Packs/day: 0.50    Years: 17.00    Types: Cigarettes  . Smokeless tobacco: Never Used  . Alcohol use No  . Drug use: No     Comment: Former user  . Sexual activity: No   Other Topics Concern  . None   Social History Narrative   ** Merged History Encounter **  Sleep: Good  Appetite:  Good  Current Medications: Current Facility-Administered Medications  Medication Dose Route Frequency Provider Last Rate Last Dose  . acetaminophen (TYLENOL) tablet 650 mg  650 mg Oral Q6H PRN Marylin Crosby, MD   650 mg at 02/02/17 1007  . albuterol (PROVENTIL HFA;VENTOLIN HFA) 108 (90 Base) MCG/ACT inhaler 1 puff  1 puff Inhalation Daily PRN Marylin Crosby, MD   1 puff at 02/02/17 2032  . alum & mag hydroxide-simeth (MAALOX/MYLANTA) 200-200-20 MG/5ML suspension 30 mL  30 mL Oral Q4H PRN Pucilowska, Jolanta B, MD      . amantadine (SYMMETREL) capsule 100 mg  100 mg Oral BID  Pucilowska, Jolanta B, MD   100 mg at 02/03/17 0852  . [START ON 02/24/2017] haloperidol decanoate (HALDOL DECANOATE) 100 MG/ML injection 50 mg  50 mg Intramuscular Q28 days Pucilowska, Jolanta B, MD      . iloperidone (FANAPT) tablet 10 mg  10 mg Oral BID Marylin Crosby, MD   10 mg at 02/03/17 0852  . magnesium hydroxide (MILK OF MAGNESIA) suspension 30 mL  30 mL Oral Daily PRN Pucilowska, Jolanta B, MD      . naproxen (NAPROSYN) tablet 250 mg  250 mg Oral BID PRN Shatavia Santor, Tyson Babinski, MD   250 mg at 02/02/17 1616  . nicotine (NICODERM CQ - dosed in mg/24 hours) patch 21 mg  21 mg Transdermal Q0600 Pucilowska, Jolanta B, MD   21 mg at 02/03/17 0854  . Oxcarbazepine (TRILEPTAL) tablet 300 mg  300 mg Oral BID Pucilowska, Jolanta B, MD   300 mg at 02/03/17 0852  . traZODone (DESYREL) tablet 100 mg  100 mg Oral QHS Pucilowska, Jolanta B, MD   100 mg at 02/02/17 2036    Lab Results: No results found for this or any previous visit (from the past 48 hour(s)).  Blood Alcohol level:  Lab Results  Component Value Date   ETH <10 01/26/2017   ETH <5 60/45/4098    Metabolic Disorder Labs: Lab Results  Component Value Date   HGBA1C 6.3 (H) 01/30/2017   MPG 134.11 01/30/2017   MPG 128 05/11/2015   No results found for: PROLACTIN Lab Results  Component Value Date   CHOL 157 01/29/2017   TRIG 119 01/29/2017   HDL 40 (L) 01/29/2017   CHOLHDL 3.9 01/29/2017   VLDL 24 01/29/2017   LDLCALC 93 01/29/2017   LDLCALC 97 05/11/2015    Physical Findings: AIMS: Facial and Oral Movements Muscles of Facial Expression: None, normal Lips and Perioral Area: None, normal Jaw: None, normal Tongue: None, normal, ,  ,  ,    CIWA:    COWS:     Musculoskeletal: Strength & Muscle Tone: within normal limits Gait & Station: normal Patient leans: N/A  Psychiatric Specialty Exam: Physical Exam  ROS  Blood pressure (!) 105/42, pulse 86, temperature 97.8 F (36.6 C), temperature source Oral, resp. rate 20,  height 5' 4"  (1.626 m), weight 112 kg (247 lb), SpO2 99 %.Body mass index is 42.4 kg/m.  General Appearance: Fairly Groomed  Eye Contact:  Good  Speech:  Clear and Coherent  Volume:  Normal  Mood:  Euthymic  Affect:  Appropriate  Thought Process:  Goal Directed  Orientation:  Full (Time, Place, and Person)  Thought Content:  Hallucinations: Auditorybut much improved  Suicidal Thoughts:  No  Homicidal Thoughts:  No  Memory:  Immediate;   Fair  Judgement:  Fair  Insight:  Fair  Psychomotor Activity:  Normal  Concentration:  Concentration: Good  Recall:  [employer] of Knowledge:  Fair  Language:  Fair  Akathisia:  No    AIMS (if indicated):   0  Assets:  Armed forces logistics/support/administrative officer Social Support  ADL's:  Intact  Cognition:  WNL  Sleep:  Number of Hours: 7.3     Treatment Plan Summary:  46 year old female with history of schizoaffective disorder admitted for worsening of psychosis with auditory command hallucinations and paranoia in the context of treatment noncompliance.  Auditory hallucinations are continuing to improve. Feels that the medication regimen that she is on is working Counselling psychologist), has not been able to tolerate multiple antipsychotics in the past.  1. Psychosis. The patient was already given Haldol Decanoate injection at [facility] emergency room (given on 01/27/17). Continue Fanapt 10 mg BID.  2. EPS. Continue Amantadine 100 mg twice daily.  3. Insomnia. Trazodone is available.  4. Smoking. Nicotine patch is available.  5. Metabolic syndrome monitoring.A1c stable from last year.TSH and lipid panel are within normal limits  6. EKG. QTc is 415 ms  7. Pregnancy test. negative  8. Disposition. The patient will be discharged to her apartment. She will follow up with [facility] team. Pt requesting discharge tomorrow and does not meet criteria for IVC. Will coordinate with ACT team tomorrow with likely discharge as she has shown significant  improvements.    Marylin Crosby, MD 02/03/2017, 11:26 AM

## 2017-02-03 NOTE — [facility] Group Notes (Signed)
[place] Group Notes:  (Nursing/MHT/Case Management/Adjunct)  Date:  02/03/2017  Time:  1:13 PM  Type of Therapy:  Psychoeducational Skills  Participation Level:  Did Not Attend  Adela Lank [facility] 02/03/2017, 1:13 PM

## 2017-02-04 NOTE — Progress Notes (Signed)
Patient ID: [name], female   DOB: October 21, 1970, 46 y.o.   MRN: [id number]   Received [name] this AM after breakfast, she received her medications, signed her discharge paperwork and her questions were answered. She was given her prescriptions and her personal belongings were returned. She verbalized feeling safe for discharge and a representative arrived to transport her home. She was discharged without incident.

## 2017-02-04 NOTE — Plan of Care (Signed)
Problem: Coping: Goal: Ability to verbalize frustrations and anger appropriately will improve Outcome: Progressing Patient was calm and pleasant this evening although affect is sad.  She remains without aggression thus far this shift.  Problem: Education: Goal: Ability to make informed decisions regarding treatment will improve Outcome: Progressing Patient denies SI/HI/AV/H and contracts for safety.  Problem: Activity: Goal: Will verbalize the importance of balancing activity with adequate rest periods Outcome: Progressing Patient is observed to be calm but quiet this evening.  She is pleasant on contact and attended group and snack in the dayroom.

## 2017-08-26 ENCOUNTER — Ambulatory Visit
Admission: RE | Admit: 2017-08-26 | Discharge: 2017-08-26 | Disposition: A | Discharge status: Home / Self Care | Payer: Medicaid Other | Source: Ambulatory Visit | Attending: Family Medicine | Admitting: Family Medicine

## 2017-08-26 ENCOUNTER — Ambulatory Visit: Payer: Self-pay

## 2017-08-26 ENCOUNTER — Ambulatory Visit: Payer: Medicaid Other

## 2017-08-26 DIAGNOSIS — Z1231 Encounter for screening mammogram for malignant neoplasm of breast: Secondary | ICD-10-CM

## 2017-09-05 ENCOUNTER — Other Ambulatory Visit: Payer: Self-pay

## 2017-09-05 ENCOUNTER — Encounter (HOSPITAL_COMMUNITY): Payer: Self-pay | Admitting: Emergency Medicine

## 2017-09-05 ENCOUNTER — Emergency Department (HOSPITAL_COMMUNITY)
Admission: EM | Admit: 2017-09-05 | Discharge: 2017-09-05 | Disposition: A | Discharge status: Home / Self Care | Payer: Medicaid Other | Attending: Emergency Medicine | Admitting: Emergency Medicine

## 2017-09-05 DIAGNOSIS — R102 Pelvic and perineal pain: Secondary | ICD-10-CM | POA: Insufficient documentation

## 2017-09-05 DIAGNOSIS — Z5321 Procedure and treatment not carried out due to patient leaving prior to being seen by health care provider: Secondary | ICD-10-CM | POA: Insufficient documentation

## 2017-09-05 NOTE — ED Triage Notes (Signed)
Pt brought in by EMS from home with c/o pelvic pain x 3 weeks  Pt states she had a pap smear 3 weeks ago and has been having pain ever since

## 2017-09-05 NOTE — ED Notes (Signed)
Pt reported to registration that she was leaving.  ?

## 2017-09-07 NOTE — ED Notes (Signed)
09/07/2017, Attempted follow-up call, no answer.

## 2017-10-03 ENCOUNTER — Encounter (HOSPITAL_COMMUNITY): Payer: Self-pay

## 2017-10-03 DIAGNOSIS — I10 Essential (primary) hypertension: Secondary | ICD-10-CM | POA: Insufficient documentation

## 2017-10-03 DIAGNOSIS — R102 Pelvic and perineal pain: Secondary | ICD-10-CM | POA: Diagnosis present

## 2017-10-03 DIAGNOSIS — F17210 Nicotine dependence, cigarettes, uncomplicated: Secondary | ICD-10-CM | POA: Insufficient documentation

## 2017-10-03 DIAGNOSIS — Z79899 Other long term (current) drug therapy: Secondary | ICD-10-CM | POA: Diagnosis not present

## 2017-10-03 DIAGNOSIS — G8929 Other chronic pain: Secondary | ICD-10-CM | POA: Insufficient documentation

## 2017-10-03 NOTE — ED Triage Notes (Signed)
Pt reports that she had pap smear 3 months ago per EMS Pt reports that she thinks that the MD performed other procedure on her instead and she having" cervix" pain.     EMS 180/90 HR 70 RR 18 o2 99% RA

## 2017-10-03 NOTE — ED Triage Notes (Signed)
Pt arrived via EMS from home and states that sh has pain in her ovaries and in her back x 3 months. Pt states that while she was getting a pelvic exam the MD has strange conversations about "hysterectomy".

## 2017-10-04 ENCOUNTER — Emergency Department (HOSPITAL_COMMUNITY)
Admission: EM | Admit: 2017-10-04 | Discharge: 2017-10-04 | Disposition: A | Discharge status: Home / Self Care | Payer: Medicaid Other | Attending: Emergency Medicine | Admitting: Emergency Medicine

## 2017-10-04 ENCOUNTER — Other Ambulatory Visit: Payer: Self-pay

## 2017-10-04 DIAGNOSIS — R102 Pelvic and perineal pain: Secondary | ICD-10-CM

## 2017-10-04 DIAGNOSIS — G8929 Other chronic pain: Secondary | ICD-10-CM

## 2017-10-04 LAB — URINALYSIS, ROUTINE W REFLEX MICROSCOPIC
BILIRUBIN URINE: NEGATIVE
GLUCOSE, UA: NEGATIVE mg/dL
HGB URINE DIPSTICK: NEGATIVE
KETONES UR: NEGATIVE mg/dL
LEUKOCYTES UA: NEGATIVE
Nitrite: NEGATIVE
PH: 5 (ref 5.0–8.0)
PROTEIN: NEGATIVE mg/dL
Specific Gravity, Urine: 1.025 (ref 1.005–1.030)

## 2017-10-04 LAB — WET PREP, GENITAL
Clue Cells Wet Prep HPF POC: NONE SEEN
Sperm: NONE SEEN
TRICH WET PREP: NONE SEEN
Yeast Wet Prep HPF POC: NONE SEEN

## 2017-10-04 LAB — GC/CHLAMYDIA PROBE AMP (~~LOC~~) NOT AT ARMC
CHLAMYDIA, DNA PROBE: NEGATIVE
NEISSERIA GONORRHEA: NEGATIVE

## 2017-10-04 LAB — PREGNANCY, URINE: Preg Test, Ur: NEGATIVE

## 2017-10-04 MED ORDER — CEFTRIAXONE SODIUM 250 MG IJ SOLR
250.0000 mg | Freq: Once | INTRAMUSCULAR | Status: AC
  Administered 2017-10-04: 250 mg via INTRAMUSCULAR
  Filled 2017-10-04: qty 250

## 2017-10-04 MED ORDER — DOXYCYCLINE HYCLATE 100 MG PO CAPS: 100.0000 mg | ORAL_CAPSULE | Freq: Two times a day (BID) | ORAL | 0 refills | Status: DC

## 2017-10-04 MED ORDER — LIDOCAINE HCL 1 % IJ SOLN
INTRAMUSCULAR | Status: AC
  Administered 2017-10-04: 0.9 mL
  Filled 2017-10-04: qty 20

## 2017-10-04 MED ORDER — AZITHROMYCIN 250 MG PO TABS
1000.0000 mg | ORAL_TABLET | Freq: Once | ORAL | Status: AC
  Administered 2017-10-04: 1000 mg via ORAL
  Filled 2017-10-04: qty 4

## 2017-10-04 MED ORDER — KETOROLAC TROMETHAMINE 60 MG/2ML IM SOLN
60.0000 mg | Freq: Once | INTRAMUSCULAR | Status: AC
  Administered 2017-10-04: 60 mg via INTRAMUSCULAR
  Filled 2017-10-04: qty 2

## 2017-10-04 NOTE — Discharge Instructions (Addendum)
Take the antibiotic as prescribed.  Follow-up with your gynecologist for your ultrasound as scheduled.  Return to the ED if you develop new or worsening symptoms.

## 2017-10-04 NOTE — ED Provider Notes (Signed)
[facility] DEPT Provider Note   CSN: [id number] Arrival date & time: 10/03/17  2246     History   Chief Complaint Chief Complaint  Patient presents with  . cervix  pain    HPI [name] is a 47 y.o. female.  Patient presents via EMS with a 11-month history of pelvic pain and "cervix pain".  She states she has had symptoms on a daily basis for the past 3 months ever since she had a Pap smear by her gynecologist.  Symptoms are constant and nothing makes it better nothing makes it worse.  She is been taking BC powders with partial relief.  Patient admits that she was seen by different gynecologist (Dr. Ulanda Edison) today and had a pelvic exam.  She is scheduled for an ultrasound in a few weeks.  States she had antibiotics prescribed but does not know why or what they were for.  She returns to the ED today because she is convinced there is something wrong with her cervix and ovaries.  She states it feels there is "cuts" on her cervix as well as bilateral ovaries.  This is the same pain she is had for 3 months.  She had no symptoms prior to the Pap smear.  Patient states she has not been sexually active since this Pap smear was performed but she has used sex toys which have been painful.  She perseverates that her gynecologist use a longer speculum than usual and she is concerned that something was done besides a Pap smear.  She has not had any vaginal bleeding other than her menstrual cycle which was last a month ago.  No vaginal discharge, fever, chills, vomiting, diarrhea or constipation.  The history is provided by the patient and the EMS personnel.    Past Medical History:  Diagnosis Date  . Arthritis   . Bipolar 1 disorder ([place])   . Bronchitis   . Depression   . Hemorrhoid   . Schizoaffective disorder ([place])   . Schizophrenia ([place])    Pt denies and reports it is schizoaffective disorder.   . Transfusion history     Patient Active Problem List   Diagnosis Date Noted  . No diagnosis on Axis I 08/21/2015  . Abnormal behavior   . Involuntary commitment   . Schizophrenia ([place])   . Disturbance in affect   . Tobacco use disorder 05/12/2015  . Cannabis use disorder, moderate, dependence ([place]) 05/12/2015  . Hypertension 02/24/2011  . ANEMIA-IRON DEFICIENCY 12/13/2006  . GERD 12/13/2006  . OBESITY 12/12/2006  . Schizoaffective disorder, bipolar type ([place]) 12/12/2006  . HEMORRHOIDS, NOS 12/12/2006  . PAIN-NECK 12/12/2006  . TREMOR 12/12/2006    Past Surgical History:  Procedure Laterality Date  . CESAREAN SECTION       OB History   None      Home Medications    Prior to Admission medications   Medication Sig Start Date End Date Taking? Authorizing Provider  diphenhydrAMINE (BENADRYL) 25 MG tablet Take 25 mg by mouth every 6 (six) hours as needed for allergies.   Yes [provider]  guaifenesin (ROBITUSSIN) 100 MG/5ML syrup Take 200 mg by mouth 3 (three) times daily as needed for cough.   Yes [provider]  oxymetazoline (AFRIN) 0.05 % nasal spray Place 1 spray into both nostrils 2 (two) times daily as needed for congestion.   Yes [provider]  pseudoephedrine-acetaminophen (TYLENOL SINUS) 30-500 MG TABS tablet Take 1 tablet by mouth  every 4 (four) hours as needed (cough).   Yes [provider]  amantadine (SYMMETREL) 100 MG capsule Take 1 capsule (100 mg total) by mouth 2 (two) times daily. Patient not taking: Reported on 10/04/2017 02/03/17   Marylin Crosby, MD  haloperidol decanoate (HALDOL DECANOATE) 100 MG/ML injection Inject 0.5 mLs (50 mg total) into the muscle every 28 (twenty-eight) days. Patient not taking: Reported on 10/04/2017 02/24/17   Marylin Crosby, MD  Iloperidone (FANAPT) 10 MG TABS Take 1 tablet (10 mg total) by mouth 2 (two) times daily. Patient not taking: Reported on 10/04/2017 02/03/17   Marylin Crosby, MD  traZODone (DESYREL) 100 MG tablet Take 1 tablet (100 mg  total) by mouth at bedtime. Patient not taking: Reported on 10/04/2017 02/03/17   Marylin Crosby, MD    Family History Family History  Problem Relation Age of Onset  . Diabetes type II Other     Social History Social History   Tobacco Use  . Smoking status: Current Every Day Smoker    Packs/day: 0.50    Years: 17.00    Pack years: 8.50    Types: Cigarettes  . Smokeless tobacco: Never Used  Substance Use Topics  . Alcohol use: No  . Drug use: No    Types: Marijuana    Comment: Former user     Allergies   Latuda [lurasidone hcl]; Abilify [aripiprazole]; Flagyl [metronidazole hcl]; Prolixin [fluphenazine]; Risperidone and related; Seroquel [quetiapine fumarate]; and Sulfa antibiotics   Review of Systems Review of Systems  Constitutional: Negative for activity change, appetite change and fever.  HENT: Negative for congestion and rhinorrhea.   Eyes: Negative for visual disturbance.  Respiratory: Negative for cough, chest tightness and shortness of breath.   Cardiovascular: Negative for chest pain.  Gastrointestinal: Negative for abdominal pain, nausea and vomiting.  Genitourinary: Positive for pelvic pain and vaginal pain. Negative for dysuria, flank pain, hematuria and menstrual problem.  Musculoskeletal: Positive for back pain. Negative for arthralgias and myalgias.  Neurological: Negative for dizziness, weakness, light-headedness and numbness.   all other systems are negative except as noted in the HPI and PMH.     Physical Exam Updated Vital Signs BP (!) 189/110 (BP Location: Left Arm) Comment: Will notify RN  Pulse 78   Temp 98.7 F (37.1 C) (Oral)   Resp 18   Ht 5\' 4"  (1.626 m)   Wt 117.9 kg (260 lb)   LMP 09/17/2017   SpO2 100%   BMI 44.63 kg/m   Physical Exam  Constitutional: She is oriented to person, place, and time. She appears well-developed and well-nourished. No distress.  anxious  HENT:  Head: Normocephalic and atraumatic.  Mouth/Throat:  Oropharynx is clear and moist. No oropharyngeal exudate.  Eyes: Pupils are equal, round, and reactive to light. Conjunctivae and EOM are normal.  Neck: Normal range of motion. Neck supple.  No meningismus.  Cardiovascular: Normal rate, regular rhythm, normal heart sounds and intact distal pulses.  No murmur heard. Pulmonary/Chest: Effort normal and breath sounds normal. No respiratory distress.  Abdominal: Soft. There is no tenderness. There is no rebound and no guarding.  Genitourinary:  Genitourinary Comments: Chaperone present.  Sarah Therapist, sports.  Normal external genitalia.  Scant white discharge in vaginal vault.  Cervix appears normal to inspection with a small pustule at the 11 o'clock position.  There is no cervical motion tenderness.  There is no lateralizing adnexal tenderness  Musculoskeletal: Normal range of motion. She exhibits no edema or tenderness.  Neurological: She is alert and oriented to person, place, and time. No cranial nerve deficit. She exhibits normal muscle tone. Coordination normal.   5/5 strength throughout. CN 2-12 intact.Equal grip strength.   Skin: Skin is warm.  Psychiatric: She has a normal mood and affect. Her behavior is normal.  Nursing note and vitals reviewed.    ED Treatments / Results  Labs (all labs ordered are listed, but only abnormal results are displayed) Labs Reviewed  WET PREP, GENITAL - Abnormal; Notable for the following components:      Result Value   WBC, Wet Prep HPF POC FEW (*)    All other components within normal limits  URINALYSIS, ROUTINE W REFLEX MICROSCOPIC - Abnormal; Notable for the following components:   APPearance HAZY (*)    All other components within normal limits  PREGNANCY, URINE  GC/CHLAMYDIA PROBE AMP ([place]) NOT AT [facility]    EKG None  Radiology No results found.  Procedures Procedures (including critical care time)  Medications Ordered in ED Medications  ketorolac (TORADOL) injection 60 mg (has no  administration in time range)     Initial Impression / Assessment and Plan / ED Course  I have reviewed the triage vital signs and the nursing notes.  Pertinent labs & imaging results that were available during my care of the patient were reviewed by me and considered in my medical decision making (see chart for details).    3 months of pelvic pain.  Abdomen is soft and nontender.  She is not pregnant.  Urinalysis is negative.  She requests another pelvic exam today which was performed without significant abnormality. Blood pressure elevated likely due to pain and anxiety.  Low suspicion for PID, tubo-ovarian abscess, ovarian torsion.  Pain is been constant for the past 3 months and does not localize. Suspect her perseveration on this pain may be due to her underlying psychiatric illness.  She is not acutely psychotic today however.   Discussed with patient that ultrasound is indicated as a next step but is not needed emergently. Will empirically treat for PID though clinically doubt. Discussed followup with her GYN for ultrasound as scheduled. Return precautions discussed.    Final Clinical Impressions(s) / ED Diagnoses   Final diagnoses:  Chronic pelvic pain in female    ED Discharge Orders    None       Otis Burress, Annie Main, MD 10/04/17 [phone]

## 2017-10-27 ENCOUNTER — Emergency Department (HOSPITAL_COMMUNITY)
Admission: EM | Admit: 2017-10-27 | Discharge: 2017-10-27 | Discharge status: Against Medical Advice | Payer: Medicaid Other | Attending: Emergency Medicine | Admitting: Emergency Medicine

## 2017-10-27 ENCOUNTER — Other Ambulatory Visit: Payer: Self-pay

## 2017-10-27 DIAGNOSIS — F259 Schizoaffective disorder, unspecified: Secondary | ICD-10-CM | POA: Insufficient documentation

## 2017-10-27 DIAGNOSIS — Z79899 Other long term (current) drug therapy: Secondary | ICD-10-CM | POA: Diagnosis not present

## 2017-10-27 DIAGNOSIS — R443 Hallucinations, unspecified: Secondary | ICD-10-CM | POA: Diagnosis not present

## 2017-10-27 DIAGNOSIS — F17210 Nicotine dependence, cigarettes, uncomplicated: Secondary | ICD-10-CM | POA: Insufficient documentation

## 2017-10-27 DIAGNOSIS — E876 Hypokalemia: Secondary | ICD-10-CM | POA: Insufficient documentation

## 2017-10-27 LAB — COMPREHENSIVE METABOLIC PANEL
ALK PHOS: 83 U/L (ref 38–126)
ALT: 15 U/L (ref 0–44)
ANION GAP: 10 (ref 5–15)
AST: 24 U/L (ref 15–41)
Albumin: 4 g/dL (ref 3.5–5.0)
BILIRUBIN TOTAL: 0.3 mg/dL (ref 0.3–1.2)
BUN: 8 mg/dL (ref 6–20)
CALCIUM: 9.3 mg/dL (ref 8.9–10.3)
CO2: 30 mmol/L (ref 22–32)
CREATININE: 0.87 mg/dL (ref 0.44–1.00)
Chloride: 101 mmol/L (ref 98–111)
GFR calc Af Amer: >60 mL/min (ref >60)
GFR calc non Af Amer: >60 mL/min (ref >60)
Glucose, Bld: 117 mg/dL — ABNORMAL HIGH (ref 70–99)
Potassium: 2.7 mmol/L — CL (ref 3.5–5.1)
Sodium: 141 mmol/L (ref 135–145)
TOTAL PROTEIN: 8.5 g/dL — AB (ref 6.5–8.1)

## 2017-10-27 LAB — CBC WITH DIFFERENTIAL/PLATELET
Basophils Absolute: 0 10*3/uL (ref 0.0–0.1)
Basophils Relative: 0 %
Eosinophils Absolute: 0 10*3/uL (ref 0.0–0.7)
Eosinophils Relative: 0 %
HEMATOCRIT: 29.5 % — AB (ref 36.0–46.0)
HEMOGLOBIN: 9 g/dL — AB (ref 12.0–15.0)
LYMPHS ABS: 2.2 10*3/uL (ref 0.7–4.0)
LYMPHS PCT: 24 %
MCH: 24 pg — ABNORMAL LOW (ref 26.0–34.0)
MCHC: 30.5 g/dL (ref 30.0–36.0)
MCV: 78.7 fL (ref 78.0–100.0)
Monocytes Absolute: 0.5 10*3/uL (ref 0.1–1.0)
Monocytes Relative: 5 %
NEUTROS ABS: 6.3 10*3/uL (ref 1.7–7.7)
NEUTROS PCT: 71 %
Platelets: 342 10*3/uL (ref 150–400)
RBC: 3.75 MIL/uL — AB (ref 3.87–5.11)
RDW: 20.4 % — ABNORMAL HIGH (ref 11.5–15.5)
WBC: 9 10*3/uL (ref 4.0–10.5)

## 2017-10-27 LAB — ACETAMINOPHEN LEVEL: Acetaminophen (Tylenol), Serum: <10 ug/mL — ABNORMAL LOW (ref 10–30)

## 2017-10-27 LAB — I-STAT BETA HCG BLOOD, ED (MC, WL, AP ONLY): I-stat hCG, quantitative: <5 m[IU]/mL (ref <5)

## 2017-10-27 LAB — ETHANOL

## 2017-10-27 LAB — MAGNESIUM: Magnesium: 2.2 mg/dL (ref 1.7–2.4)

## 2017-10-27 LAB — SALICYLATE LEVEL: Salicylate Lvl: <7 mg/dL (ref 2.8–30.0)

## 2017-10-27 MED ORDER — POTASSIUM CHLORIDE 10 MEQ/100ML IV SOLN
10.0000 meq | INTRAVENOUS | Status: DC
  Administered 2017-10-27: 10 meq via INTRAVENOUS
  Filled 2017-10-27 (×3): qty 100

## 2017-10-27 MED ORDER — POTASSIUM CHLORIDE CRYS ER 20 MEQ PO TBCR
40.0000 meq | EXTENDED_RELEASE_TABLET | Freq: Once | ORAL | Status: AC
  Administered 2017-10-27: 40 meq via ORAL
  Filled 2017-10-27: qty 2

## 2017-10-27 MED ORDER — POTASSIUM CHLORIDE CRYS ER 20 MEQ PO TBCR: 40.0000 meq | EXTENDED_RELEASE_TABLET | Freq: Two times a day (BID) | ORAL | 0 refills | Status: DC

## 2017-10-27 MED ORDER — SODIUM CHLORIDE 0.9 % IV SOLN
Freq: Once | INTRAVENOUS | Status: AC
  Administered 2017-10-27: 13:00:00 via INTRAVENOUS

## 2017-10-27 NOTE — ED Notes (Signed)
TTS MD Provider at bedside.

## 2017-10-27 NOTE — ED Notes (Addendum)
PT DECLINES TO ANSWER SCREENING AND TRAVELING QUESTIONS. MULTIPLE ATTEMPTS GIVEN, TIME ALLOWED TO ANSWER.

## 2017-10-27 NOTE — ED Notes (Signed)
PT DECLINED EKG BY NT TWICE AND THIS WRITER. EDPA CORTNEY MADE AWARE OF PT'S DECLINE IN CARE. OUR ATTEMPTS FOR EKG. PT DID TAKE 40 MEQ PO POTASSIUM AND PT'S REMOVAL OF HER IV. NO ADDITIONAL ORDERS GIVEN AT THIS TIME.

## 2017-10-27 NOTE — ED Notes (Signed)
PT GIVEN AVS DISCHARGE INSTRUCTIONS. PT WILL BE SIGNING AGAINST MEDICATION ADVICE. RISK AND BENEFITS DISCUSSED BY EDPA COURTNEY. PT VERBALIZED UNDERSTANDING.

## 2017-10-27 NOTE — ED Notes (Signed)
EDPA COURTNEY AND LCSW IN TO SEE PT. EDPA SPOKE WITH PSYCH TEAM TO ENSURE PT IS SAFE TO BE DISCHARGED. PSYCH TEAM OK WITH DISCHARGE.

## 2017-10-27 NOTE — ED Notes (Signed)
Date and time results received: 10/27/17 10:00 AM    Test: potassium Critical Value: 2.7  Name of Provider Notified: [place] MD  Orders Received? Or Actions Taken?: verbalizes placing orders "for runs of potassium" at present

## 2017-10-27 NOTE — ED Notes (Addendum)
EDPA AT BEDSIDE 

## 2017-10-27 NOTE — BH Assessment (Signed)
Assessment Note  [name] is an 47 y.o. female who presented in the ED paranoid and feeling like [name] was going to kill her.Patient states that she was off her mental health medications for three days and she states that her thinking was distorted.  Patient also has a critical potassium lab value.  Patient states that she is not currently suicidal, homicidal and she denies any current AVH.  However, patient appears to be experiencing some thought blocking. Patient states that she is involved with an ACTT Team-[facility] and states: "I am not supposed to be admitted to a hospital."  Patient states, "I just needed my medications, I don't need to be in the hospital."  Patient is able to contract for safety.  She has been in touch with her son and states that her medications arrived today and feels like she will be fine to return home and start taking her medications and she feels like everything will be fine.  She states that she plans to follow-up with her ACTT Team.  Patient is alert, oriented, organized in her thought processes, but again she does have some thought blocking.  Her memory appears to be intact.  Her eye contact is good, her speech clear and coherent.    Diagnosis: F 24.) Schizoaffective Disorder Bipolar Type  Past Medical History:  Past Medical History:  Diagnosis Date  . Arthritis   . Bipolar 1 disorder ([place])   . Bronchitis   . Depression   . Hemorrhoid   . Schizoaffective disorder ([place])   . Schizophrenia ([place])    Pt denies and reports it is schizoaffective disorder.   . Transfusion history     Past Surgical History:  Procedure Laterality Date  . CESAREAN SECTION      Family History:  Family History  Problem Relation Age of Onset  . Diabetes type II Other     Social History:  reports that she has been smoking cigarettes.  She has a 8.50 pack-year smoking history. She has never used smokeless tobacco. She reports that she does not drink  alcohol or use drugs.  Additional Social History:  Alcohol / Drug Use Pain Medications: denies Prescriptions: denies Over the Counter: denies History of alcohol / drug use?: No history of alcohol / drug abuse  CIWA: CIWA-Ar BP: (!) 134/96 Pulse Rate: 96 COWS:    Allergies:  Allergies  Allergen Reactions  . Anette Guarneri [Lurasidone Hcl] Nausea And Vomiting and Other (See Comments)    Reaction:  Shaking   . Abilify [Aripiprazole] Other (See Comments)    shaking  . Flagyl [Metronidazole Hcl] Nausea And Vomiting  . Prolixin [Fluphenazine] Other (See Comments)    shaking  . Risperidone And Related Other (See Comments)    shaking  . Seroquel [Quetiapine Fumarate]     Patient states she had a syncopal episode after taking medication.   . Sulfa Antibiotics Nausea And Vomiting    Home Medications:  (Not in a hospital admission)  OB/GYN Status:  No LMP recorded.  General Assessment Data [facility]: [facility] TTS Assessment: In system Is this a Tele or Face-to-Face Assessment?: Face-to-Face Is this an Initial Assessment or a Re-assessment for this encounter?: Initial Assessment Marital status: Single Living Arrangements: Children Can pt return to current living arrangement?: Yes Admission Status: Voluntary Is patient capable of signing voluntary admission?: Yes Referral Source: Self/Family/Friend Insurance type: (Medicaid)     Crisis Care Plan Living Arrangements: Children Legal Guardian: Other: Name of Psychiatrist:  [name] Name of Therapist: ([facility] )  Education Status Is patient currently in school?: No Is the patient employed, unemployed or receiving disability?: Receiving disability income  Risk to self with the past 6 months Suicidal Ideation: No Has patient been a risk to self within the past 6 months prior to admission? : No Suicidal Intent: No Has patient had any suicidal intent within the past 6 months prior to admission? :  No Is patient at risk for suicide?: No Suicidal Plan?: No Has patient had any suicidal plan within the past 6 months prior to admission? : No Access to Means: No What has been your use of drugs/alcohol within the last 12 months?: (none) Previous Attempts/Gestures: No How many times?: 0 Other Self Harm Risks: (none) Triggers for Past Attempts: None known Intentional Self Injurious Behavior: None Family Suicide History: No Recent stressful life event(s): Other (Comment)(none reported) Persecutory voices/beliefs?: No Depression: Yes Depression Symptoms: Insomnia, Loss of interest in usual pleasures Substance abuse history and/or treatment for substance abuse?: No Suicide prevention information given to non-admitted patients: Not applicable  Risk to Others within the past 6 months Homicidal Ideation: No Does patient have any lifetime risk of violence toward others beyond the six months prior to admission? : No Thoughts of Harm to Others: No Current Homicidal Intent: No Current Homicidal Plan: No Access to Homicidal Means: No Identified Victim: (none) History of harm to others?: No Assessment of Violence: None Noted Violent Behavior Description: (none) Does patient have access to weapons?: No Criminal Charges Pending?: No Does patient have a court date: No Is patient on probation?: No  Psychosis Hallucinations: None noted Delusions: (thought that Dorie Rank Obama was going to kill her.)  Mental Status Report Appearance/Hygiene: Unremarkable Eye Contact: Good Motor Activity: Freedom of movement Speech: Logical/coherent Level of Consciousness: Alert Mood: Depressed Affect: Blunted, Flat Anxiety Level: Minimal Thought Processes: Coherent, Relevant Judgement: Unimpaired Orientation: Person, Place, Time, Situation Obsessive Compulsive Thoughts/Behaviors: None  Cognitive Functioning Concentration: Normal Memory: Recent Intact, Remote Intact Is patient IDD: No Is patient DD?:  No Insight: Poor Impulse Control: Fair Appetite: Good Have you had any weight changes? : No Change Sleep: Decreased Total Hours of Sleep: 3(3) Vegetative Symptoms: None  ADLScreening [facility] Assessment Services) Patient's cognitive ability adequate to safely complete daily activities?: Yes Patient able to express need for assistance with ADLs?: Yes Independently performs ADLs?: Yes (appropriate for developmental age)  Prior Inpatient Therapy Prior Inpatient Therapy: Yes([facility] one year ago) Prior Therapy Dates: Last year Prior Therapy Facilty/Provider(s): ([facility]) Reason for Treatment: (schizo-affective disorder)  Prior Outpatient Therapy Prior Outpatient Therapy: Yes Prior Therapy Dates: active([facility]) Prior Therapy Facilty/Provider(s): Dr [name] Reason for Treatment: schizoaffective disorder Does patient have an ACCT team?: Yes Does patient have Intensive In-House Services?  : No Does patient have [facility] services? : No Does patient have P4CC services?: No  ADL Screening (condition at time of admission) Patient's cognitive ability adequate to safely complete daily activities?: Yes Is the patient deaf or have difficulty hearing?: No Does the patient have difficulty seeing, even when wearing glasses/contacts?: No Does the patient have difficulty concentrating, remembering, or making decisions?: No Patient able to express need for assistance with ADLs?: Yes Does the patient have difficulty dressing or bathing?: No Independently performs ADLs?: Yes (appropriate for developmental age) Does the patient have difficulty walking or climbing stairs?: No Weakness of Legs: None Weakness of Arms/Hands: None     Therapy Consults (therapy consults require a physician order) PT Evaluation  Needed: No OT Evalulation Needed: No SLP Evaluation Needed: No Abuse/Neglect Assessment (Assessment to be complete while patient is alone) Abuse/Neglect Assessment Can Be  Completed: Yes Physical Abuse: Denies Verbal Abuse: Denies Sexual Abuse: Denies Exploitation of patient/patient's resources: Denies Self-Neglect: Denies Values / Beliefs Cultural Requests During Hospitalization: None Spiritual Requests During Hospitalization: None Consults Spiritual Care Consult Needed: No Social Work Consult Needed: No Regulatory affairs officer (For Healthcare) Does Patient Have a Medical Advance Directive?: No Would patient like information on creating a medical advance directive?: No - Patient declined Nutrition Screen- [facility] Has the patient recently lost weight without trying?: No Has the patient been eating poorly because of a decreased appetite?: No Malnutrition Screening Tool Score: 0  Additional Information CIRT Risk: No Elopement Risk: No Does patient have medical clearance?: Yes     Disposition: Per Jinny Blossom, NP, Patient does not meet admission criteria and can discharged to follow-up with her ACTT Team, [facility]. Disposition Initial Assessment Completed for this Encounter: Yes Disposition of Patient: Discharge Patient refused recommended treatment: No Mode of transportation if patient is discharged?: Bus Patient referred to: ([facility])  On Site Evaluation by:   Reviewed with Physician:    Judeth Porch Akansha Wyche 10/27/2017 11:34 AM

## 2017-10-27 NOTE — ED Triage Notes (Signed)
Per EMS: Pt reports she believes [name] is trying to kill her. Pt has not been taking her medicine and is fidgeting and acting anxious. Pt has a hx of schizoaffective disorder. Pt has a blank stare but will answer orientation questions appropriately.

## 2017-10-27 NOTE — ED Notes (Signed)
CHARGE MATT Q RN MADE AWARE OF PT'S CURRENT PLAN OF CARE.

## 2017-10-27 NOTE — ED Notes (Addendum)
VERBAL ORDER BY EDPA COURTNEY TO SIGN PT OUT AMA.

## 2017-10-27 NOTE — Discharge Instructions (Addendum)
You were given a prescription for potassium supplementation.  You need to take 1 tablet daily for the next 7 days and have your levels rechecked by your primary care doctor.  For your behavioral health needs, you are advised to continue treatment with the [facility] ACT Team:       [facility]      [address]      [place], [place] [address]      [phone]

## 2017-10-27 NOTE — ED Provider Notes (Signed)
[place] DEPT Provider Note   CSN: [id number] Arrival date & time: 10/27/17  0805     History   Chief Complaint Chief Complaint  Patient presents with  . Hallucinations    HPI [name] is a 47 y.o. female.  HPI   Patient is a 47 year old female with history of arthritis, bipolar 1 disorder, panic colitis, depression, schizoaffective disorder, schizophrenia, who presents the emergency department with her son to be evaluated for hallucinations.  Per EMS, patient please [name] is trying to kill her.  She answered orientation questions appropriately.  Patient's son, Dorothea Ogle, is at bedside and states that patient has been having hallucinations for the last 1 to 2 days.  He states that she has not been taking her medications at home for at least the last 3 days. Son denies any drug or ETOH use.   On my exam patient does report the people are trying to kill her, she will not elaborate on this.  She will intermittently answer questions. Does not report any medical complaints. Pt has level 5 caveat due to psychiatric history and pt refusing to answer questions.   Past Medical History:  Diagnosis Date  . Arthritis   . Bipolar 1 disorder ([place])   . Bronchitis   . Depression   . Hemorrhoid   . Schizoaffective disorder ([place])   . Schizophrenia ([place])    Pt denies and reports it is schizoaffective disorder.   . Transfusion history     Patient Active Problem List   Diagnosis Date Noted  . No diagnosis on Axis I 08/21/2015  . Abnormal behavior   . Involuntary commitment   . Schizophrenia ([place])   . Disturbance in affect   . Tobacco use disorder 05/12/2015  . Cannabis use disorder, moderate, dependence ([place]) 05/12/2015  . Hypertension 02/24/2011  . ANEMIA-IRON DEFICIENCY 12/13/2006  . GERD 12/13/2006  . OBESITY 12/12/2006  . Schizoaffective disorder, bipolar type ([place]) 12/12/2006  . HEMORRHOIDS, NOS 12/12/2006  . PAIN-NECK 12/12/2006    . TREMOR 12/12/2006    Past Surgical History:  Procedure Laterality Date  . CESAREAN SECTION       OB History   None      Home Medications    Prior to Admission medications   Medication Sig Start Date End Date Taking? Authorizing Provider  Brexpiprazole (REXULTI) 4 MG TABS Take 2-4 mg by mouth See admin instructions. 2mg  QAM, 4mg  QHS   Yes [provider]  carbamazepine (TEGRETOL XR) 400 MG 12 hr tablet Take 400 mg by mouth at bedtime.   Yes [provider]  amantadine (SYMMETREL) 100 MG capsule Take 1 capsule (100 mg total) by mouth 2 (two) times daily. 02/03/17   McNew, Tyson Babinski, MD  diphenhydrAMINE (BENADRYL) 25 MG tablet Take 25 mg by mouth every 6 (six) hours as needed for allergies.    [provider]  doxycycline (VIBRAMYCIN) 100 MG capsule Take 1 capsule (100 mg total) by mouth 2 (two) times daily. 10/04/17   Rancour, Annie Main, MD  guaifenesin (ROBITUSSIN) 100 MG/5ML syrup Take 200 mg by mouth 3 (three) times daily as needed for cough.    [provider]  haloperidol decanoate (HALDOL DECANOATE) 100 MG/ML injection Inject 0.5 mLs (50 mg total) into the muscle every 28 (twenty-eight) days. 02/24/17   McNew, Tyson Babinski, MD  Iloperidone (FANAPT) 10 MG TABS Take 1 tablet (10 mg total) by mouth 2 (two) times daily. 02/03/17   McNew, Tyson Babinski, MD  oxymetazoline (AFRIN) 0.05 % nasal spray Place 1 spray into both nostrils 2 (two) times daily as needed for congestion.    [provider]  potassium chloride SA (K-DUR,KLOR-CON) 20 MEQ tablet Take 2 tablets (40 mEq total) by mouth 2 (two) times daily for 7 days. 10/27/17 11/03/17  Olin Gurski S, PA-C  pseudoephedrine-acetaminophen (TYLENOL SINUS) 30-500 MG TABS tablet Take 1 tablet by mouth every 4 (four) hours as needed (cough).    [provider]  traZODone (DESYREL) 100 MG tablet Take 1 tablet (100 mg total) by mouth at bedtime. 02/03/17   McNew, Tyson Babinski, MD    Family History Family  History  Problem Relation Age of Onset  . Diabetes type II Other     Social History Social History   Tobacco Use  . Smoking status: Current Every Day Smoker    Packs/day: 0.50    Years: 17.00    Pack years: 8.50    Types: Cigarettes  . Smokeless tobacco: Never Used  Substance Use Topics  . Alcohol use: No  . Drug use: No    Types: Marijuana    Comment: Former user     Allergies   Latuda [lurasidone hcl]; Abilify [aripiprazole]; Flagyl [metronidazole hcl]; Prolixin [fluphenazine]; Risperidone and related; Seroquel [quetiapine fumarate]; and Sulfa antibiotics   Review of Systems Review of Systems  Unable to perform ROS: Other (psychiatric history, refusing to answer questions)     Physical Exam Updated Vital Signs BP (!) 150/75 (BP Location: Left Arm)   Pulse 85   Temp 98.2 F (36.8 C) (Oral)   Resp 18   SpO2 100%   Physical Exam  Constitutional: She appears well-developed and well-nourished. No distress.  HENT:  Head: Normocephalic and atraumatic.  Eyes: Pupils are equal, round, and reactive to light. Conjunctivae are normal.  Neck: Neck supple.  Cardiovascular: Normal rate, regular rhythm and normal heart sounds.  Pulmonary/Chest: Effort normal and breath sounds normal. No stridor. No respiratory distress. She has no wheezes.  Musculoskeletal: Normal range of motion.  Neurological: She is alert.  Skin: Skin is warm and dry. Capillary refill takes less than 2 seconds.  Psychiatric:  Appears to be responding to internal stimuli, blunt affect, staring straight ahead, will not maintain eye contact  Nursing note and vitals reviewed.  ED Treatments / Results  Labs (all labs ordered are listed, but only abnormal results are displayed) Labs Reviewed  CBC WITH DIFFERENTIAL/PLATELET - Abnormal; Notable for the following components:      Result Value   RBC 3.75 (*)    Hemoglobin 9.0 (*)    HCT 29.5 (*)    MCH 24.0 (*)    RDW 20.4 (*)    All other components  within normal limits  COMPREHENSIVE METABOLIC PANEL - Abnormal; Notable for the following components:   Potassium 2.7 (*)    Glucose, Bld 117 (*)    Total Protein 8.5 (*)    All other components within normal limits  ACETAMINOPHEN LEVEL - Abnormal; Notable for the following components:   Acetaminophen (Tylenol), Serum <10 (*)    All other components within normal limits  SALICYLATE LEVEL  ETHANOL  MAGNESIUM  RAPID URINE DRUG SCREEN, HOSP PERFORMED  I-STAT BETA HCG BLOOD, ED ([facility], [facility], [facility] ONLY)    EKG None  Radiology No results found.  Procedures Procedures (including critical care time)  Medications Ordered in ED Medications  potassium chloride 10 mEq in 100 mL IVPB (0 mEq Intravenous Stopped 10/27/17 1305)  potassium  chloride SA (K-DUR,KLOR-CON) CR tablet 40 mEq (40 mEq Oral Given 10/27/17 1243)  0.9 %  sodium chloride infusion ( Intravenous Stopped 10/27/17 1305)     Initial Impression / Assessment and Plan / ED Course  I have reviewed the triage vital signs and the nursing notes.  Pertinent labs & imaging results that were available during my care of the patient were reviewed by me and considered in my medical decision making (see chart for details).  Re-evaluated pt. She is now speaking to me and is alert and oriented. Spoke with the patient multiple times she was refusing potassium supplementation and EKG.  Patient initially agreed to take p.o. and IV potassium as well as have a EKG completed.  After taking p.o. potassium and initiating IV potassium, she pulled out her IV and again refused treatment.  Spoke with her again about taking medications and she declines.  She states that she will follow-up with her doctor about her low potassium.  Discussed the risks of leaving the hospital without being fully treated including permanent disability or death.  Patient voices an understanding of the risks and still chooses to be discharged.  I will give her a prescription for oral  potassium and advised her to follow-up with her PCP to have her electrolytes rechecked later this week.  She agrees to follow-up as discussed.  I advised her on strict return precautions and she agrees to return if she has any symptoms.   1:20 PM spoke with Maxie Barb, NP about the patient refusing treatment.  She states that the patient does have capacity to make decisions.  States that patient is only off her meds because she ran out of them at home.  Her psychiatric meds have been delivered to her house and she is in agreement to take them when she is discharged.  She does not think that there is any reason to IVC the patient.   Final Clinical Impressions(s) / ED Diagnoses   Final diagnoses:  Hallucinations  Hypokalemia   Pt with a h/o bipolar 1, schizoaffective, schizophrenia presenting the ED today to be evaluated for hallucinations and paranoid behavior.  Afebrile, otherwise vital signs stable.  Will not answer questions however does not express any medical complaints.  Physical exam is benign.  Lab work with stable anemia, no leukocytosis. Lipase negative. Salicylate and tylenol levels WNL. ETOH negative. Mg WNL. CMP with hypokalemia to 2.7. Supplemented with 40kdur and attempted IV supplementation as above which was refused. Rx given pt encouraged to f/u with PCP or return for tx.  Pt cleared by psych to go home and take her normal medications and f/u with her psychiatrist.   Pt given strict return precautions for any new or worsening sxs. She agrees to return if worse. All questions answered. Pt will sign out AMA.  ED Discharge Orders        Ordered    potassium chloride SA (K-DUR,KLOR-CON) 20 MEQ tablet  2 times daily     10/27/17 [address] Jhoana Upham S, PA-C 10/27/17 1341    Milton Ferguson, MD 10/27/17 [phone]

## 2017-10-27 NOTE — ED Notes (Addendum)
PT DECLINES ORAL AND IV POTASSIUM AT PRESENT. PT STATES SHE WOULD LIKE TO GO HOME. THIS WRITER DISCUSSED RISK AND BENEFITS WITH PT. PT CONTINUES TO DECLINE TAKING MEDICATION. PT ALSO DECLINED EKG. EDPA COURTNEY C. MADE AWARE AND IS AT BEDSIDE WITH PT.

## 2017-10-27 NOTE — ED Notes (Signed)
PT STATES SHE WILL CALL FOR A RIDE UP FRONT. ENCOURAGED PT TO CALL FOR A RIDE WHILE WAITING. PT DECLINED.

## 2017-10-27 NOTE — BH Assessment (Signed)
[place] Assessment Progress Note  Per Leotis Shames, MD, this pt does not require psychiatric hospitalization at this time.  Pt is to be discharged from [facility] with recommendation to continue treatment with the [facility].  This has been included in pt's discharge instructions.  Pt's nurse, Fara Chute, has been notified.  Jalene Mullet, [place] Triage Specialist [phone]

## 2017-11-03 ENCOUNTER — Emergency Department (HOSPITAL_COMMUNITY)
Admission: EM | Admit: 2017-11-03 | Discharge: 2017-11-04 | Disposition: A | Discharge status: Home / Self Care | Payer: Medicaid Other | Source: Home / Self Care | Attending: Emergency Medicine | Admitting: Emergency Medicine

## 2017-11-03 ENCOUNTER — Encounter (HOSPITAL_COMMUNITY): Payer: Self-pay

## 2017-11-03 ENCOUNTER — Encounter (HOSPITAL_COMMUNITY): Payer: Self-pay | Admitting: Emergency Medicine

## 2017-11-03 ENCOUNTER — Other Ambulatory Visit: Payer: Self-pay

## 2017-11-03 ENCOUNTER — Emergency Department (HOSPITAL_COMMUNITY)
Admission: EM | Admit: 2017-11-03 | Discharge: 2017-11-03 | Discharge status: Against Medical Advice | Payer: Medicaid Other | Attending: Emergency Medicine | Admitting: Emergency Medicine

## 2017-11-03 DIAGNOSIS — Z5320 Procedure and treatment not carried out because of patient's decision for unspecified reasons: Secondary | ICD-10-CM | POA: Insufficient documentation

## 2017-11-03 DIAGNOSIS — I10 Essential (primary) hypertension: Secondary | ICD-10-CM | POA: Insufficient documentation

## 2017-11-03 DIAGNOSIS — E876 Hypokalemia: Secondary | ICD-10-CM

## 2017-11-03 DIAGNOSIS — Z79899 Other long term (current) drug therapy: Secondary | ICD-10-CM | POA: Diagnosis not present

## 2017-11-03 DIAGNOSIS — F17210 Nicotine dependence, cigarettes, uncomplicated: Secondary | ICD-10-CM | POA: Diagnosis not present

## 2017-11-03 DIAGNOSIS — R531 Weakness: Secondary | ICD-10-CM | POA: Diagnosis present

## 2017-11-03 LAB — URINALYSIS, ROUTINE W REFLEX MICROSCOPIC
Bilirubin Urine: NEGATIVE
GLUCOSE, UA: NEGATIVE mg/dL
Hgb urine dipstick: NEGATIVE
Ketones, ur: 5 mg/dL — AB
Leukocytes, UA: NEGATIVE
Nitrite: NEGATIVE
PH: 5 (ref 5.0–8.0)
Protein, ur: 30 mg/dL — AB
SPECIFIC GRAVITY, URINE: 1.03 (ref 1.005–1.030)

## 2017-11-03 LAB — LIPASE, BLOOD: Lipase: 21 U/L (ref 11–51)

## 2017-11-03 LAB — COMPREHENSIVE METABOLIC PANEL
ALBUMIN: 3.9 g/dL (ref 3.5–5.0)
ALK PHOS: 79 U/L (ref 38–126)
ALT: 11 U/L (ref 0–44)
AST: 19 U/L (ref 15–41)
Anion gap: 10 (ref 5–15)
BILIRUBIN TOTAL: 0.2 mg/dL — AB (ref 0.3–1.2)
BUN: 10 mg/dL (ref 6–20)
CO2: 29 mmol/L (ref 22–32)
CREATININE: 0.82 mg/dL (ref 0.44–1.00)
Calcium: 9.5 mg/dL (ref 8.9–10.3)
Chloride: 105 mmol/L (ref 98–111)
GFR calc Af Amer: >60 mL/min (ref >60)
GFR calc non Af Amer: >60 mL/min (ref >60)
GLUCOSE: 106 mg/dL — AB (ref 70–99)
Potassium: 3.2 mmol/L — ABNORMAL LOW (ref 3.5–5.1)
SODIUM: 144 mmol/L (ref 135–145)
TOTAL PROTEIN: 8.3 g/dL — AB (ref 6.5–8.1)

## 2017-11-03 LAB — CBC
HCT: 30.1 % — ABNORMAL LOW (ref 36.0–46.0)
Hemoglobin: 9.3 g/dL — ABNORMAL LOW (ref 12.0–15.0)
MCH: 24.1 pg — AB (ref 26.0–34.0)
MCHC: 30.9 g/dL (ref 30.0–36.0)
MCV: 78 fL (ref 78.0–100.0)
PLATELETS: 337 10*3/uL (ref 150–400)
RBC: 3.86 MIL/uL — ABNORMAL LOW (ref 3.87–5.11)
RDW: 19.6 % — AB (ref 11.5–15.5)
WBC: 8.1 10*3/uL (ref 4.0–10.5)

## 2017-11-03 LAB — I-STAT BETA HCG BLOOD, ED (MC, WL, AP ONLY): I-stat hCG, quantitative: <5 m[IU]/mL (ref <5)

## 2017-11-03 NOTE — ED Notes (Signed)
Pt called for triage. No answer.

## 2017-11-03 NOTE — ED Notes (Signed)
Pt is aware a urine sample is needed, but is unable to provide one at this time. Specimen cup was provided in lobby.

## 2017-11-03 NOTE — ED Notes (Signed)
Called Pt in lobby for vital recheck, no response in lobby x2.

## 2017-11-03 NOTE — ED Triage Notes (Signed)
Pt came [facility] States :Generalized weakness, was seen last week at [facility], was told she had a low iron count and potassium 118/95 pulse 93 SPO2 98 CBG 167

## 2017-11-04 ENCOUNTER — Encounter (HOSPITAL_COMMUNITY): Payer: Self-pay | Admitting: Emergency Medicine

## 2017-11-04 MED ORDER — POTASSIUM CHLORIDE CRYS ER 20 MEQ PO TBCR
40.0000 meq | EXTENDED_RELEASE_TABLET | Freq: Once | ORAL | Status: DC
  Filled 2017-11-04: qty 2

## 2017-11-04 NOTE — ED Notes (Signed)
NT attempted to take pt's orthostatic vital signs--- pt continued to refused.

## 2017-11-04 NOTE — ED Provider Notes (Addendum)
[facility] DEPT Provider Note   CSN: [id number] Arrival date & time: 11/03/17  2045     History   Chief Complaint Chief Complaint  Patient presents with  . Weakness    HPI [name] is a 47 y.o. female.  The history is provided by the patient.  Illness  This is a chronic problem. The current episode started more than 1 week ago (8 months ago). The problem has been gradually worsening. Pertinent negatives include no chest pain, no abdominal pain, no headaches and no shortness of breath. Nothing aggravates the symptoms. Nothing relieves the symptoms. She has tried nothing for the symptoms. The treatment provided no relief.    Past Medical History:  Diagnosis Date  . Arthritis   . Bipolar 1 disorder ([place])   . Bronchitis   . Depression   . Hemorrhoid   . Schizoaffective disorder ([place])   . Schizophrenia ([place])    Pt denies and reports it is schizoaffective disorder.   . Transfusion history     Patient Active Problem List   Diagnosis Date Noted  . No diagnosis on Axis I 08/21/2015  . Abnormal behavior   . Involuntary commitment   . Schizophrenia ([place])   . Disturbance in affect   . Tobacco use disorder 05/12/2015  . Cannabis use disorder, moderate, dependence ([place]) 05/12/2015  . Hypertension 02/24/2011  . ANEMIA-IRON DEFICIENCY 12/13/2006  . GERD 12/13/2006  . OBESITY 12/12/2006  . Schizoaffective disorder, bipolar type ([place]) 12/12/2006  . HEMORRHOIDS, NOS 12/12/2006  . PAIN-NECK 12/12/2006  . TREMOR 12/12/2006    Past Surgical History:  Procedure Laterality Date  . CESAREAN SECTION       OB History   None      Home Medications    Prior to Admission medications   Medication Sig Start Date End Date Taking? Authorizing Provider  amantadine (SYMMETREL) 100 MG capsule Take 1 capsule (100 mg total) by mouth 2 (two) times daily. 02/03/17   McNew, Tyson Babinski, MD  Brexpiprazole (REXULTI) 4 MG TABS Take 2-4 mg by mouth See  admin instructions. 2mg  QAM, 4mg  QHS    [provider]  carbamazepine (TEGRETOL XR) 400 MG 12 hr tablet Take 400 mg by mouth at bedtime.    [provider]  diphenhydrAMINE (BENADRYL) 25 MG tablet Take 25 mg by mouth every 6 (six) hours as needed for allergies.    [provider]  doxycycline (VIBRAMYCIN) 100 MG capsule Take 1 capsule (100 mg total) by mouth 2 (two) times daily. 10/04/17   Rancour, Annie Main, MD  guaifenesin (ROBITUSSIN) 100 MG/5ML syrup Take 200 mg by mouth 3 (three) times daily as needed for cough.    [provider]  haloperidol decanoate (HALDOL DECANOATE) 100 MG/ML injection Inject 0.5 mLs (50 mg total) into the muscle every 28 (twenty-eight) days. 02/24/17   McNew, Tyson Babinski, MD  Iloperidone (FANAPT) 10 MG TABS Take 1 tablet (10 mg total) by mouth 2 (two) times daily. 02/03/17   McNew, Tyson Babinski, MD  oxymetazoline (AFRIN) 0.05 % nasal spray Place 1 spray into both nostrils 2 (two) times daily as needed for congestion.    [provider]  potassium chloride SA (K-DUR,KLOR-CON) 20 MEQ tablet Take 2 tablets (40 mEq total) by mouth 2 (two) times daily for 7 days. 10/27/17 11/03/17  Couture, Cortni S, PA-C  pseudoephedrine-acetaminophen (TYLENOL SINUS) 30-500 MG TABS tablet Take 1 tablet by mouth every 4 (four) hours as needed (cough).  [provider]  traZODone (DESYREL) 100 MG tablet Take 1 tablet (100 mg total) by mouth at bedtime. 02/03/17   McNew, Tyson Babinski, MD    Family History Family History  Problem Relation Age of Onset  . Diabetes type II Other     Social History Social History   Tobacco Use  . Smoking status: Current Every Day Smoker    Packs/day: 0.50    Years: 17.00    Pack years: 8.50    Types: Cigarettes  . Smokeless tobacco: Never Used  Substance Use Topics  . Alcohol use: No  . Drug use: No    Types: Marijuana    Comment: Former user     Allergies   Latuda [lurasidone hcl]; Abilify [aripiprazole];  Flagyl [metronidazole hcl]; Prolixin [fluphenazine]; Risperidone and related; Seroquel [quetiapine fumarate]; and Sulfa antibiotics   Review of Systems Review of Systems  Constitutional: Positive for fatigue. Negative for chills and fever.  HENT: Negative for rhinorrhea, sinus pain, trouble swallowing and voice change.   Respiratory: Negative for chest tightness and shortness of breath.   Cardiovascular: Negative for chest pain, palpitations and leg swelling.  Gastrointestinal: Negative for abdominal pain, constipation, diarrhea, nausea, rectal pain and vomiting.  Musculoskeletal: Negative for joint swelling and myalgias.  Neurological: Negative for dizziness, tremors, seizures, syncope, facial asymmetry, speech difficulty, weakness, light-headedness, numbness and headaches.  Psychiatric/Behavioral: Negative for self-injury, sleep disturbance and suicidal ideas.  All other systems reviewed and are negative.    Physical Exam Updated Vital Signs BP 139/78 (BP Location: Left Arm)   Pulse 83   Temp 99 F (37.2 C) (Oral)   Resp 18   Ht 5\' 4"  (1.626 m)   Wt 109.7 kg (241 lb 14.4 oz)   SpO2 100%   BMI 41.52 kg/m   Physical Exam  Constitutional: She is oriented to person, place, and time. She appears well-developed and well-nourished. No distress.  HENT:  Head: Normocephalic and atraumatic.  Right Ear: External ear normal.  Left Ear: External ear normal.  Nose: Nose normal.  Mouth/Throat: Oropharynx is clear and moist. No oropharyngeal exudate.  Eyes: Pupils are equal, round, and reactive to light. Conjunctivae are normal.  Neck: Normal range of motion. Neck supple.  Cardiovascular: Normal rate, regular rhythm, normal heart sounds and intact distal pulses.  Pulmonary/Chest: Effort normal and breath sounds normal. No stridor. She has no wheezes. She has no rales.  Abdominal: Soft. Bowel sounds are normal. She exhibits no mass. There is no tenderness. There is no rebound and no  guarding.  Musculoskeletal: Normal range of motion.  Neurological: She is alert and oriented to person, place, and time. She displays normal reflexes. No cranial nerve deficit.  Skin: Skin is warm and dry. Capillary refill takes less than 2 seconds.  Psychiatric: She has a normal mood and affect.     ED Treatments / Results  Labs (all labs ordered are listed, but only abnormal results are displayed) Results for orders placed or performed during the hospital encounter of 10/27/17  CBC with Differential  Result Value Ref Range   WBC 9.0 4.0 - 10.5 K/uL   RBC 3.75 (L) 3.87 - 5.11 MIL/uL   Hemoglobin 9.0 (L) 12.0 - 15.0 g/dL   HCT 29.5 (L) 36.0 - 46.0 %   MCV 78.7 78.0 - 100.0 fL   MCH 24.0 (L) 26.0 - 34.0 pg   MCHC 30.5 30.0 - 36.0 g/dL   RDW 20.4 (H) 11.5 - 15.5 %   Platelets 342  150 - 400 K/uL   Neutrophils Relative % 71 %   Neutro Abs 6.3 1.7 - 7.7 K/uL   Lymphocytes Relative 24 %   Lymphs Abs 2.2 0.7 - 4.0 K/uL   Monocytes Relative 5 %   Monocytes Absolute 0.5 0.1 - 1.0 K/uL   Eosinophils Relative 0 %   Eosinophils Absolute 0.0 0.0 - 0.7 K/uL   Basophils Relative 0 %   Basophils Absolute 0.0 0.0 - 0.1 K/uL  Comprehensive metabolic panel  Result Value Ref Range   Sodium 141 135 - 145 mmol/L   Potassium 2.7 (LL) 3.5 - 5.1 mmol/L   Chloride 101 98 - 111 mmol/L   CO2 30 22 - 32 mmol/L   Glucose, Bld 117 (H) 70 - 99 mg/dL   BUN 8 6 - 20 mg/dL   Creatinine, Ser 0.87 0.44 - 1.00 mg/dL   Calcium 9.3 8.9 - 10.3 mg/dL   Total Protein 8.5 (H) 6.5 - 8.1 g/dL   Albumin 4.0 3.5 - 5.0 g/dL   AST 24 15 - 41 U/L   ALT 15 0 - 44 U/L   Alkaline Phosphatase 83 38 - 126 U/L   Total Bilirubin 0.3 0.3 - 1.2 mg/dL   GFR calc non Af Amer >60 >60 mL/min   GFR calc Af Amer >60 >60 mL/min   Anion gap 10 5 - 15  Acetaminophen level  Result Value Ref Range   Acetaminophen (Tylenol), Serum <10 (L) 10 - 30 ug/mL  Salicylate level  Result Value Ref Range   Salicylate Lvl <9.9 2.8 - 30.0  mg/dL  Ethanol  Result Value Ref Range   Alcohol, Ethyl (B) <10 <10 mg/dL  Magnesium  Result Value Ref Range   Magnesium 2.2 1.7 - 2.4 mg/dL  I-Stat beta hCG blood, ED  Result Value Ref Range   I-stat hCG, quantitative <5.0 <5 mIU/mL   Comment 3           No results found. Radiology No results found.  Procedures Procedures (including critical care time)  Medications Ordered in ED Medications  potassium chloride SA (K-DUR,KLOR-CON) CR tablet 40 mEq (has no administration in time range)   I had a lengthy discussion about the plan when I saw her and she agreed to the plan.  Patient refused orthostatics and the potassium ordered.  She reportedly put her clothes on and said "I have to go home"   Final Clinical Impressions(s) / ED Diagnoses   Return for pain, numbness, changes in vision or speech, fevers >100.4 unrelieved by medication, shortness of breath, intractable vomiting, or diarrhea, abdominal pain, Inability to tolerate liquids or food, cough, altered mental status or any concerns. No signs of systemic illness or infection. The patient is nontoxic-appearing on exam and vital signs are within normal limits. Will refer to urology for microscopy hematuria as patient is asymptomatic.  I have reviewed the triage vital signs and the nursing notes. Pertinent labs &imaging results that were available during my care of the patient were reviewed by me and considered in my medical decision making (see chart for details).  After history, exam, and medical workup I feel the patient has been appropriately medically screened and is safe for discharge home. Pertinent diagnoses were discussed with the patient. Patient was given return precautions.   Livianna Petraglia, MD 11/04/17 [id number]    Veatrice Kells, MD 11/04/17 0028

## 2017-11-04 NOTE — ED Notes (Signed)
Pt refused orthostatic vital signs.

## 2017-11-05 ENCOUNTER — Other Ambulatory Visit: Payer: Self-pay

## 2017-11-05 ENCOUNTER — Encounter (HOSPITAL_COMMUNITY): Payer: Self-pay

## 2017-11-05 ENCOUNTER — Emergency Department (HOSPITAL_COMMUNITY)
Admission: EM | Admit: 2017-11-05 | Discharge: 2017-11-06 | Disposition: A | Discharge status: Home / Self Care | Payer: Medicaid Other | Attending: Emergency Medicine | Admitting: Emergency Medicine

## 2017-11-05 DIAGNOSIS — F250 Schizoaffective disorder, bipolar type: Secondary | ICD-10-CM | POA: Diagnosis not present

## 2017-11-05 DIAGNOSIS — Z79899 Other long term (current) drug therapy: Secondary | ICD-10-CM | POA: Insufficient documentation

## 2017-11-05 DIAGNOSIS — F259 Schizoaffective disorder, unspecified: Secondary | ICD-10-CM | POA: Diagnosis present

## 2017-11-05 DIAGNOSIS — R1084 Generalized abdominal pain: Secondary | ICD-10-CM | POA: Diagnosis present

## 2017-11-05 DIAGNOSIS — F17210 Nicotine dependence, cigarettes, uncomplicated: Secondary | ICD-10-CM | POA: Diagnosis not present

## 2017-11-05 DIAGNOSIS — F1220 Cannabis dependence, uncomplicated: Secondary | ICD-10-CM

## 2017-11-05 LAB — COMPREHENSIVE METABOLIC PANEL
ALT: 10 U/L (ref 0–44)
AST: 16 U/L (ref 15–41)
Albumin: 3.8 g/dL (ref 3.5–5.0)
Alkaline Phosphatase: 71 U/L (ref 38–126)
Anion gap: 10 (ref 5–15)
BUN: 5 mg/dL — ABNORMAL LOW (ref 6–20)
CO2: 26 mmol/L (ref 22–32)
Calcium: 9.2 mg/dL (ref 8.9–10.3)
Chloride: 105 mmol/L (ref 98–111)
Creatinine, Ser: 0.82 mg/dL (ref 0.44–1.00)
GFR calc Af Amer: >60 mL/min (ref >60)
GFR calc non Af Amer: >60 mL/min (ref >60)
Glucose, Bld: 102 mg/dL — ABNORMAL HIGH (ref 70–99)
Potassium: 3.2 mmol/L — ABNORMAL LOW (ref 3.5–5.1)
Sodium: 141 mmol/L (ref 135–145)
Total Bilirubin: 0.3 mg/dL (ref 0.3–1.2)
Total Protein: 7.8 g/dL (ref 6.5–8.1)

## 2017-11-05 LAB — RAPID URINE DRUG SCREEN, HOSP PERFORMED
Amphetamines: NOT DETECTED
BARBITURATES: NOT DETECTED
Benzodiazepines: NOT DETECTED
COCAINE: NOT DETECTED
Opiates: NOT DETECTED
Tetrahydrocannabinol: NOT DETECTED

## 2017-11-05 LAB — I-STAT BETA HCG BLOOD, ED (MC, WL, AP ONLY)

## 2017-11-05 LAB — URINALYSIS, ROUTINE W REFLEX MICROSCOPIC
Bilirubin Urine: NEGATIVE
Glucose, UA: NEGATIVE mg/dL
Ketones, ur: NEGATIVE mg/dL
Leukocytes, UA: NEGATIVE
Nitrite: NEGATIVE
Protein, ur: NEGATIVE mg/dL
Specific Gravity, Urine: 1.009 (ref 1.005–1.030)
pH: 6 (ref 5.0–8.0)

## 2017-11-05 LAB — CBC
HCT: 28.1 % — ABNORMAL LOW (ref 36.0–46.0)
Hemoglobin: 8.7 g/dL — ABNORMAL LOW (ref 12.0–15.0)
MCH: 24.2 pg — ABNORMAL LOW (ref 26.0–34.0)
MCHC: 31 g/dL (ref 30.0–36.0)
MCV: 78.1 fL (ref 78.0–100.0)
Platelets: 356 10*3/uL (ref 150–400)
RBC: 3.6 MIL/uL — ABNORMAL LOW (ref 3.87–5.11)
RDW: 19.4 % — ABNORMAL HIGH (ref 11.5–15.5)
WBC: 9.4 10*3/uL (ref 4.0–10.5)

## 2017-11-05 MED ORDER — POTASSIUM CHLORIDE CRYS ER 20 MEQ PO TBCR: 40.0000 meq | EXTENDED_RELEASE_TABLET | Freq: Two times a day (BID) | ORAL | Status: DC

## 2017-11-05 MED ORDER — CARBAMAZEPINE ER 200 MG PO TB12
400.0000 mg | ORAL_TABLET | Freq: Every day | ORAL | Status: DC
  Administered 2017-11-05: 400 mg via ORAL
  Filled 2017-11-05: qty 2

## 2017-11-05 MED ORDER — TRAZODONE HCL 100 MG PO TABS
100.0000 mg | ORAL_TABLET | Freq: Every day | ORAL | Status: DC
  Administered 2017-11-05: 100 mg via ORAL
  Filled 2017-11-05: qty 1

## 2017-11-05 MED ORDER — HALOPERIDOL DECANOATE 100 MG/ML IM SOLN
50.0000 mg | INTRAMUSCULAR | Status: DC
  Administered 2017-11-05: 50 mg via INTRAMUSCULAR
  Filled 2017-11-05: qty 0.5

## 2017-11-05 MED ORDER — AMANTADINE HCL 100 MG PO CAPS
100.0000 mg | ORAL_CAPSULE | Freq: Two times a day (BID) | ORAL | Status: DC
  Administered 2017-11-05 – 2017-11-06 (×3): 100 mg via ORAL
  Filled 2017-11-05 (×3): qty 1

## 2017-11-05 MED ORDER — BREXPIPRAZOLE 2 MG PO TABS
4.0000 mg | ORAL_TABLET | Freq: Every day | ORAL | Status: DC
  Administered 2017-11-05: 4 mg via ORAL
  Filled 2017-11-05 (×2): qty 2

## 2017-11-05 MED ORDER — BREXPIPRAZOLE 2 MG PO TABS
2.0000 mg | ORAL_TABLET | Freq: Every day | ORAL | Status: DC
  Administered 2017-11-06: 2 mg via ORAL
  Filled 2017-11-05: qty 1

## 2017-11-05 MED ORDER — POTASSIUM CHLORIDE CRYS ER 20 MEQ PO TBCR
40.0000 meq | EXTENDED_RELEASE_TABLET | Freq: Once | ORAL | Status: AC
  Administered 2017-11-05: 40 meq via ORAL
  Filled 2017-11-05: qty 2

## 2017-11-05 MED ORDER — BREXPIPRAZOLE 4 MG PO TABS: 2.0000 mg | ORAL_TABLET | ORAL | Status: DC

## 2017-11-05 NOTE — ED Notes (Signed)
Pt to room #34. Reports to this nurse "I've been terrible." Pt endorsing AVH. "the voices are unbearable" Pt reports skipping medication doses d/t forgetting to take her medication. encouragement and support provided. Special checks q 15 mins in place for safety, Video monitoring in place. Will continue to monitor.

## 2017-11-05 NOTE — ED Notes (Signed)
SBAR Report received from previous nurse. Pt received resting on unit. Pt denies current SI/ HI, depression, anxiety, or pain at this time, and appears otherwise stable and free of distress. Pt does endorse A/ V H. Pt reminded of camera surveillance, q 15 min rounds, and rules of the milieu. Will continue to assess.

## 2017-11-05 NOTE — ED Provider Notes (Signed)
[facility] DEPT Provider Note   CSN: [id number] Arrival date & time: 11/05/17  1048     History   Chief Complaint Chief Complaint  Patient presents with  . Abdominal Pain    HPI [name] is a 47 y.o. female.  Patient initially stated to someone that her abdomen hurt.  When I saw her she was not having abdominal pain but states she has been taking her psychiatric medicines and that she has been hearing voices.  She is not telling me exactly what she hears  The history is provided by the patient. No language interpreter was used.  Illness  This is a recurrent problem. The problem occurs constantly. The problem has not changed since onset.Pertinent negatives include no chest pain, no abdominal pain and no headaches. Nothing aggravates the symptoms. Nothing relieves the symptoms. She has tried nothing for the symptoms. The treatment provided no relief.    Past Medical History:  Diagnosis Date  . Arthritis   . Bipolar 1 disorder ([place])   . Bronchitis   . Depression   . Hemorrhoid   . Schizoaffective disorder ([place])   . Schizophrenia ([place])    Pt denies and reports it is schizoaffective disorder.   . Transfusion history     Patient Active Problem List   Diagnosis Date Noted  . No diagnosis on Axis I 08/21/2015  . Abnormal behavior   . Involuntary commitment   . Schizophrenia ([place])   . Disturbance in affect   . Tobacco use disorder 05/12/2015  . Cannabis use disorder, moderate, dependence ([place]) 05/12/2015  . Hypertension 02/24/2011  . ANEMIA-IRON DEFICIENCY 12/13/2006  . GERD 12/13/2006  . OBESITY 12/12/2006  . Schizoaffective disorder, bipolar type ([place]) 12/12/2006  . HEMORRHOIDS, NOS 12/12/2006  . PAIN-NECK 12/12/2006  . TREMOR 12/12/2006    Past Surgical History:  Procedure Laterality Date  . CESAREAN SECTION       OB History   None      Home Medications    Prior to Admission medications   Medication Sig Start  Date End Date Taking? Authorizing Provider  amantadine (SYMMETREL) 100 MG capsule Take 1 capsule (100 mg total) by mouth 2 (two) times daily. 02/03/17   McNew, Tyson Babinski, MD  Brexpiprazole (REXULTI) 4 MG TABS Take 2-4 mg by mouth See admin instructions. 2mg  QAM, 4mg  QHS    [provider]  carbamazepine (TEGRETOL XR) 400 MG 12 hr tablet Take 400 mg by mouth at bedtime.    [provider]  haloperidol decanoate (HALDOL DECANOATE) 100 MG/ML injection Inject 0.5 mLs (50 mg total) into the muscle every 28 (twenty-eight) days. 02/24/17   McNew, Tyson Babinski, MD  potassium chloride SA (K-DUR,KLOR-CON) 20 MEQ tablet Take 2 tablets (40 mEq total) by mouth 2 (two) times daily for 7 days. 10/27/17 11/03/17  Couture, Cortni S, PA-C  traZODone (DESYREL) 100 MG tablet Take 1 tablet (100 mg total) by mouth at bedtime. 02/03/17   McNew, Tyson Babinski, MD    Family History Family History  Problem Relation Age of Onset  . Diabetes type II Other     Social History Social History   Tobacco Use  . Smoking status: Current Every Day Smoker    Packs/day: 0.50    Years: 17.00    Pack years: 8.50    Types: Cigarettes  . Smokeless tobacco: Never Used  Substance Use Topics  . Alcohol use: No  . Drug use: No    Types: Marijuana  Comment: Former user     [place] hcl]; Abilify [aripiprazole]; Flagyl [metronidazole hcl]; Prolixin [fluphenazine]; Risperidone and related; Seroquel [quetiapine fumarate]; and Sulfa antibiotics   Review of Systems Review of Systems  Constitutional: Negative for appetite change and fatigue.  HENT: Negative for congestion, ear discharge and sinus pressure.   Eyes: Negative for discharge.  Respiratory: Negative for cough.   Cardiovascular: Negative for chest pain.  Gastrointestinal: Negative for abdominal pain and diarrhea.  Genitourinary: Negative for frequency and hematuria.  Musculoskeletal: Negative for back pain.  Skin: Negative for rash.    Neurological: Negative for seizures and headaches.  Psychiatric/Behavioral: Positive for hallucinations.     Physical Exam Updated Vital Signs BP (!) 108/58 (BP Location: Left Arm)   Pulse 67   Temp 98.6 F (37 C) (Oral)   Resp 20   SpO2 100%   Physical Exam  Constitutional: She appears well-developed.  HENT:  Head: Normocephalic.  Eyes: Conjunctivae and EOM are normal. No scleral icterus.  Neck: Neck supple. No thyromegaly present.  Cardiovascular: Normal rate and regular rhythm. Exam reveals no gallop and no friction rub.  No murmur heard. Pulmonary/Chest: No stridor. She has no wheezes. She has no rales. She exhibits no tenderness.  Abdominal: She exhibits no distension. There is no tenderness. There is no rebound.  Musculoskeletal: Normal range of motion. She exhibits no edema.  Lymphadenopathy:    She has no cervical adenopathy.  Neurological: She is alert. She exhibits normal muscle tone. Coordination normal.  Oriented to person only  Skin: No rash noted. No erythema.  Psychiatric:  Patient is having auditory hallucinations     ED Treatments / Results  Labs (all labs ordered are listed, but only abnormal results are displayed) Labs Reviewed  COMPREHENSIVE METABOLIC PANEL - Abnormal; Notable for the following components:      Result Value   Potassium 3.2 (*)    Glucose, Bld 102 (*)    BUN 5 (*)    All other components within normal limits  CBC - Abnormal; Notable for the following components:   RBC 3.60 (*)    Hemoglobin 8.7 (*)    HCT 28.1 (*)    MCH 24.2 (*)    RDW 19.4 (*)    All other components within normal limits  RAPID URINE DRUG SCREEN, HOSP PERFORMED  URINALYSIS, ROUTINE W REFLEX MICROSCOPIC  ETHANOL  I-STAT BETA HCG BLOOD, ED ([facility], [facility], [facility] ONLY)    EKG None  Radiology No results found.  Procedures Procedures (including critical care time)  Medications Ordered in ED Medications  potassium chloride SA (K-DUR,KLOR-CON) CR tablet 40 mEq  (has no administration in time range)  amantadine (SYMMETREL) capsule 100 mg (has no administration in time range)  Brexpiprazole TABS 2-4 mg (has no administration in time range)  carbamazepine (TEGRETOL XR) 12 hr tablet 400 mg (has no administration in time range)  haloperidol decanoate (HALDOL DECANOATE) 100 MG/ML injection 50 mg (has no administration in time range)  potassium chloride SA (K-DUR,KLOR-CON) CR tablet 40 mEq (has no administration in time range)  traZODone (DESYREL) tablet 100 mg (has no administration in time range)     Initial Impression / Assessment and Plan / ED Course  I have reviewed the triage vital signs and the nursing notes.  Pertinent labs & imaging results that were available during my care of the patient were reviewed by me and considered in my medical decision making (see chart for details).     Patient  is medically cleared.  Patient will be treated by psych for auditory hallucinations.  She also has a mild anemia that she can follow-up with her primary care doctor for continued care  Final Clinical Impressions(s) / ED Diagnoses   Final diagnoses:  None    ED Discharge Orders    None       Milton Ferguson, MD 11/05/17 1524

## 2017-11-05 NOTE — ED Notes (Signed)
Pt refused to have vital signs taken at this time. RN notified.

## 2017-11-05 NOTE — ED Notes (Signed)
Belongings placed in locker #34. Belongings itemized on belongings sheet.  Bra, underwear, dress, flip-flops, wallet, [place] Card, [place] ID, EBT #[id number], Medicaid card, Mastercard [phone], Mastercard [phone]

## 2017-11-05 NOTE — BH Assessment (Signed)
Assessment Note  [name] is an 47 y.o. female that presents this date with altered mental state. Patient will not verbally participate in the assessment and just nods "yes" and "no" to some of the questions on the assessment. Patient denies any S/I or H/I shaking her head "no." Patient will not answer any other questions associated with the rest of the assessment. It is unclear why patient is presenting to ED this date since patient is non-verbal. Patient has been seen multiple times at [facility] with the last assessment completed on 10/27/17 when patient presented reporting on that date that [name] was trying to kill her. Patient per that note, had not been taking her medications. Patient has a history of schizoaffective disorder. Patient has a blank stare and will not answer any questions but seems to process the content of this writer's questions as evidenced by patient nodding "yes" and "no" to questions associated with S/I and H/I. Patient's UDS is pending at this time. Case was staffed with [name] FNP who recommended patient be monitored and observed for safety. Patient will be seen by psychiatry in the a.m.        Diagnosis: F25.1 Schizoaffective disorder, Depressive type (per notes)   Past Medical History:  Past Medical History:  Diagnosis Date  . Arthritis   . Bipolar 1 disorder ([place])   . Bronchitis   . Depression   . Hemorrhoid   . Schizoaffective disorder ([place])   . Schizophrenia ([place])    Pt denies and reports it is schizoaffective disorder.   . Transfusion history     Past Surgical History:  Procedure Laterality Date  . CESAREAN SECTION      Family History:  Family History  Problem Relation Age of Onset  . Diabetes type II Other     Social History:  reports that she has been smoking cigarettes.  She has a 8.50 pack-year smoking history. She has never used smokeless tobacco. She reports that she does not drink alcohol or use drugs.  Additional Social History:   Alcohol / Drug Use Pain Medications: denies Prescriptions: denies Over the Counter: denies History of alcohol / drug use?: No history of alcohol / drug abuse Longest period of sobriety (when/how long): NA Negative Consequences of Use: (Denies) Withdrawal Symptoms: (Denies)  CIWA: CIWA-Ar BP: (!) 108/58 Pulse Rate: 67 COWS:    Allergies:  Allergies  Allergen Reactions  . Anette Guarneri [Lurasidone Hcl] Nausea And Vomiting and Other (See Comments)    Reaction:  Shaking   . Abilify [Aripiprazole] Other (See Comments)    shaking  . Flagyl [Metronidazole Hcl] Nausea And Vomiting  . Prolixin [Fluphenazine] Other (See Comments)    shaking  . Risperidone And Related Other (See Comments)    shaking  . Seroquel [Quetiapine Fumarate]     Patient states she had a syncopal episode after taking medication.   . Sulfa Antibiotics Nausea And Vomiting    Home Medications:  (Not in a hospital admission)  OB/GYN Status:  No LMP recorded.  General Assessment Data Location of Assessment: [facility] TTS Assessment: In system Is this a Tele or Face-to-Face Assessment?: Face-to-Face Is this an Initial Assessment or a Re-assessment for this encounter?: Initial Assessment Marital status: Single Maiden name: NA Is patient pregnant?: No Pregnancy Status: No Living Arrangements: Children Can pt return to current living arrangement?: Yes Admission Status: Voluntary Is patient capable of signing voluntary admission?: Yes Referral Source: Self/Family/Friend Insurance type: Medicaid  Medical Screening Exam ([place])  Medical Exam completed: Yes  Crisis Care Plan Living Arrangements: Children Legal Guardian: (None) Name of Psychiatrist: [name]  Name of Therapist: [facility]  Education Status Is patient currently in school?: No Is the patient employed, unemployed or receiving disability?: Receiving disability income  Risk to self with the past 6 months Suicidal Ideation: No Has  patient been a risk to self within the past 6 months prior to admission? : No Suicidal Intent: No Has patient had any suicidal intent within the past 6 months prior to admission? : No Is patient at risk for suicide?: No Suicidal Plan?: No Has patient had any suicidal plan within the past 6 months prior to admission? : No Access to Means: No What has been your use of drugs/alcohol within the last 12 months?: None Previous Attempts/Gestures: No How many times?: 0 Other Self Harm Risks: None Triggers for Past Attempts: None known Intentional Self Injurious Behavior: None Family Suicide History: No Recent stressful life event(s): Other (Comment)(None noted) Persecutory voices/beliefs?: No Depression: Yes Depression Symptoms: Feeling worthless/self pity Substance abuse history and/or treatment for substance abuse?: No Suicide prevention information given to non-admitted patients: Not applicable  Risk to Others within the past 6 months Homicidal Ideation: No Does patient have any lifetime risk of violence toward others beyond the six months prior to admission? : No Thoughts of Harm to Others: No Current Homicidal Intent: No Current Homicidal Plan: No Access to Homicidal Means: No Identified Victim: NA History of harm to others?: No Assessment of Violence: None Noted Violent Behavior Description: NA Does patient have access to weapons?: No Criminal Charges Pending?: No Does patient have a court date: No Is patient on probation?: No  Psychosis Hallucinations: (UTA) Delusions: None noted  Mental Status Report Appearance/Hygiene: Unremarkable Eye Contact: Fair Motor Activity: Freedom of movement Speech: (UTA) Level of Consciousness: Unable to assess Mood: (UTA) Affect: Flat Anxiety Level: Minimal Thought Processes: Unable to Assess Judgement: Impaired Orientation: Unable to assess Obsessive Compulsive Thoughts/Behaviors: Unable to Assess  Cognitive  Functioning Concentration: Unable to Assess Memory: Unable to Assess Is patient IDD: No Is patient DD?: No Insight: Unable to Assess Impulse Control: Unable to Assess Appetite: (UTA) Have you had any weight changes? : (UTA) Sleep: Unable to Assess Total Hours of Sleep: (UTA) Vegetative Symptoms: (UTA)  ADLScreening [facility] Assessment Services) Patient's cognitive ability adequate to safely complete daily activities?: Yes Patient able to express need for assistance with ADLs?: Yes Independently performs ADLs?: Yes (appropriate for developmental age)  Prior Inpatient Therapy Prior Inpatient Therapy: Yes Prior Therapy Dates: 2018 Prior Therapy Facilty/Provider(s): [facility] Reason for Treatment: MH issues  Prior Outpatient Therapy Prior Outpatient Therapy: Yes Prior Therapy Dates: Ongoing Prior Therapy Facilty/Provider(s): [name] MD Reason for Treatment: MH issues Does patient have an ACCT team?: Yes Does patient have Intensive In-House Services?  : No Does patient have [facility] services? : No Does patient have P4CC services?: No  ADL Screening (condition at time of admission) Patient's cognitive ability adequate to safely complete daily activities?: Yes Is the patient deaf or have difficulty hearing?: No Does the patient have difficulty seeing, even when wearing glasses/contacts?: No Does the patient have difficulty concentrating, remembering, or making decisions?: Yes Patient able to express need for assistance with ADLs?: Yes Does the patient have difficulty dressing or bathing?: No Independently performs ADLs?: Yes (appropriate for developmental age) Does the patient have difficulty walking or climbing stairs?: No Weakness of Legs: None Weakness of Arms/Hands: None  Home Assistive Devices/Equipment Home Assistive Devices/Equipment: None  Therapy Consults (therapy consults require a physician order) PT Evaluation Needed: No OT Evalulation Needed: No SLP Evaluation Needed:  No Abuse/Neglect Assessment (Assessment to be complete while patient is alone) Physical Abuse: Denies Verbal Abuse: Denies Sexual Abuse: Denies Exploitation of patient/patient's resources: Denies Self-Neglect: Denies Values / Beliefs Cultural Requests During Hospitalization: None Spiritual Requests During Hospitalization: None Consults Spiritual Care Consult Needed: No Social Work Consult Needed: No Regulatory affairs officer (For Healthcare) Does Patient Have a Medical Advance Directive?: No Would patient like information on creating a medical advance directive?: No - Patient declined    Additional Information 1:1 In Past 12 Months?: No CIRT Risk: No Elopement Risk: No Does patient have medical clearance?: Yes     Disposition: Case was staffed with [name] FNP who recommended patient be monitored and observed for safety. Patient will be seen by psychiatry in the a.m.        Disposition Initial Assessment Completed for this Encounter: Yes Disposition of Patient: (Observe and monitor) Patient refused recommended treatment: No Mode of transportation if patient is discharged?: Tomasita Crumble)  On Site Evaluation by:   Reviewed with Physician:    Mamie Nick 11/05/2017 3:00 PM

## 2017-11-05 NOTE — ED Notes (Signed)
Pt belongings include, two sandals, a dress, underwear, a bra, and a wallet

## 2017-11-05 NOTE — BH Assessment (Signed)
[facility] Assessment Progress Note  Case was staffed with Parks FNP who recommended patient be monitored and observed for safety. Patient will be seen by psychiatry in the a.m.      

## 2017-11-05 NOTE — ED Notes (Signed)
Patient refused RN Bethena Roys made aware

## 2017-11-05 NOTE — ED Triage Notes (Addendum)
Pt BIBA . Pt walked into [place], and stated that she generalized abd pain for several days. Denies N/V/D. Pt is schizoaffective and has been off her meds as well. VSS en route. After initially answering this RNs questions, pt refused to answer in triage.

## 2017-11-05 NOTE — ED Notes (Signed)
Bed: [facility] Expected date:  Expected time:  Means of arrival:  Comments: Rm 5

## 2017-11-06 DIAGNOSIS — Z79899 Other long term (current) drug therapy: Secondary | ICD-10-CM

## 2017-11-06 DIAGNOSIS — F250 Schizoaffective disorder, bipolar type: Secondary | ICD-10-CM

## 2017-11-06 DIAGNOSIS — F17210 Nicotine dependence, cigarettes, uncomplicated: Secondary | ICD-10-CM

## 2017-11-06 NOTE — ED Notes (Signed)
Patient refused vitals RN Bethena Roys aware

## 2017-11-06 NOTE — Progress Notes (Signed)
CSW made aware of patient's readiness for discharge and possible involvement with [facility] in [place].  CSW attempted contact with [facility] [phone]). The ACT Team office does not operate on weekends. No option to leave a voicemail.   Stephanie Acre, [place] Social Worker [phone]

## 2017-11-06 NOTE — Consult Note (Addendum)
[facility] Psych ED Discharge  11/06/2017 11:58 AM [name]  MRN:  [id number] Principal Problem: Schizo affective schizophrenia [facility]) Discharge Diagnoses:  Patient Active Problem List   Diagnosis Date Noted  . No diagnosis on Axis I [Z03.89] 08/21/2015  . Abnormal behavior [R46.89]   . Involuntary commitment [Z04.6]   . Schizophrenia ([place]) [F20.9]   . Disturbance in affect [R45.89]   . Tobacco use disorder [F17.200] 05/12/2015  . Cannabis use disorder, moderate, dependence ([place]) [F12.20] 05/12/2015  . Schizo affective schizophrenia ([place]) [F25.0] 05/10/2015  . Hypertension [I10] 02/24/2011  . ANEMIA-IRON DEFICIENCY [D50.9] 12/13/2006  . GERD [K21.9] 12/13/2006  . OBESITY [E66.9] 12/12/2006  . Schizoaffective disorder, bipolar type ([place]) [F25.0] 12/12/2006  . HEMORRHOIDS, NOS [K64.9] 12/12/2006  . PAIN-NECK [M54.2] 12/12/2006  . TREMOR [R25.9] 12/12/2006    Subjective: Pt was seen and chart reviewed with treatment team and Dr [name].  Pt denies suicidal/homicidal ideation, endorses auditory/visual hallucinations, which she states she is not scared of,  and does not appear to be responding to internal stimuli. Pt stated she came to the hospital because she was off her meds for a few days and she wanted to get back on them. Pt was restarted on her medications; Symmetrel 100 mg BID, Brexpiprozole 2 mg daily and 4 mg at bedtime, Tegretol XR 400 mg at bedtime and haldol decanoate injection 100 mg every 28 days. Pt lives alone and has an ACT team with [facility]. Pt's UDS and BAL negative. Pt's labs were unremarkable. Pt is stable and psychiatrically clear for discharge.   Total Time spent with patient: 45 minutes  Past Psychiatric History: As above  Past Medical History:  Past Medical History:  Diagnosis Date  . Arthritis   . Bipolar 1 disorder ([place])   . Bronchitis   . Depression   . Hemorrhoid   . Schizoaffective disorder ([place])   . Schizophrenia ([place])    Pt denies and reports  it is schizoaffective disorder.   . Transfusion history    Past Surgical History:  Procedure Laterality Date  . CESAREAN SECTION     Family History:  Family History  Problem Relation Age of Onset  . Diabetes type II Other    Family Psychiatric  History: Unknown Social History:  Social History   Substance and Sexual Activity  Alcohol Use No    Social History   Substance and Sexual Activity  Drug Use No  . Types: Marijuana   Comment: Former user   Social History   Socioeconomic History  . Marital status: Legally Separated    Spouse name: Not on file  . Number of children: Not on file  . Years of education: Not on file  . Highest education level: Not on file  Occupational History  . Not on file  Social Needs  . Financial resource strain: Not on file  . Food insecurity:    Worry: Not on file    Inability: Not on file  . Transportation needs:    Medical: Not on file    Non-medical: Not on file  Tobacco Use  . Smoking status: Current Every Day Smoker    Packs/day: 0.50    Years: 17.00    Pack years: 8.50    Types: Cigarettes  . Smokeless tobacco: Never Used  Substance and Sexual Activity  . Alcohol use: No  . Drug use: No    Types: Marijuana    Comment: Former user  . Sexual activity: Never  Lifestyle  . Physical  activity:    Days per week: Not on file    Minutes per session: Not on file  . Stress: Not on file  Relationships  . Social connections:    Talks on phone: Not on file    Gets together: Not on file    Attends religious service: Not on file    Active member of club or organization: Not on file    Attends meetings of clubs or organizations: Not on file    Relationship status: Not on file  Other Topics Concern  . Not on file  Social History Narrative   ** Merged History Encounter **        Has this patient used any form of tobacco in the last 30 days? (Cigarettes, Smokeless Tobacco, Cigars, and/or Pipes) Prescription not provided because: Pt  declines  Current Medications: Current Facility-Administered Medications  Medication Dose Route Frequency Provider Last Rate Last Dose  . amantadine (SYMMETREL) capsule 100 mg  100 mg Oral BID Milton Ferguson, MD   100 mg at 11/06/17 1039  . Brexpiprazole TABS 2 mg  2 mg Oral Daily Milton Ferguson, MD   2 mg at 11/06/17 1039  . Brexpiprazole TABS 4 mg  4 mg Oral QHS Milton Ferguson, MD   4 mg at 11/05/17 2146  . carbamazepine (TEGRETOL XR) 12 hr tablet 400 mg  400 mg Oral QHS Milton Ferguson, MD   400 mg at 11/05/17 2146  . haloperidol decanoate (HALDOL DECANOATE) 100 MG/ML injection 50 mg  50 mg Intramuscular Q28 days Milton Ferguson, MD   50 mg at 11/05/17 1654  . traZODone (DESYREL) tablet 100 mg  100 mg Oral QHS Milton Ferguson, MD   100 mg at 11/05/17 2146   Current Outpatient Medications  Medication Sig Dispense Refill  . amantadine (SYMMETREL) 100 MG capsule Take 1 capsule (100 mg total) by mouth 2 (two) times daily. 60 capsule 0  . Brexpiprazole (REXULTI) 4 MG TABS Take 2-4 mg by mouth See admin instructions. 2mg  QAM, 4mg  QHS    . carbamazepine (TEGRETOL XR) 400 MG 12 hr tablet Take 400 mg by mouth at bedtime.    . traZODone (DESYREL) 100 MG tablet Take 1 tablet (100 mg total) by mouth at bedtime. 30 tablet 0  . haloperidol decanoate (HALDOL DECANOATE) 100 MG/ML injection Inject 0.5 mLs (50 mg total) into the muscle every 28 (twenty-eight) days. 1 mL 0  . potassium chloride SA (K-DUR,KLOR-CON) 20 MEQ tablet Take 2 tablets (40 mEq total) by mouth 2 (two) times daily for 7 days. 28 tablet 0    Musculoskeletal: Strength & Muscle Tone: within normal limits Gait & Station: normal Patient leans: N/A  Psychiatric Specialty Exam: Physical Exam  Constitutional: She is oriented to person, place, and time. She appears well-developed and well-nourished.  HENT:  Head: Normocephalic.  Respiratory: Effort normal.  Musculoskeletal: Normal range of motion.  Neurological: She is alert and oriented  to person, place, and time.  Psychiatric: Her speech is normal and behavior is normal. Thought content is delusional. Cognition and memory are normal. She expresses impulsivity. She exhibits a depressed mood.    Review of Systems  Psychiatric/Behavioral: Positive for depression and hallucinations (auditory and visual). Negative for memory loss, substance abuse and suicidal ideas. The patient is not nervous/anxious and does not have insomnia.   All other systems reviewed and are negative.   Blood pressure (!) 173/97, pulse 67, temperature 98.6 F (37 C), temperature source Oral, resp. rate 18, SpO2 100 %.There  is no height or weight on file to calculate BMI.  General Appearance: Casual  Eye Contact:  Good  Speech:  Clear and Coherent and Normal Rate  Volume:  Normal  Mood:  Depressed  Affect:  Congruent and Depressed  Thought Process:  Coherent and Linear  Orientation:  Full (Time, Place, and Person)  Thought Content:  Hallucinations: Auditory Visual  Suicidal Thoughts:  No  Homicidal Thoughts:  No  Memory:  Immediate;   Good Recent;   Good Remote;   Fair  Judgement:  Fair  Insight:  Fair  Psychomotor Activity:  Normal  Concentration:  Concentration: Good and Attention Span: Good  Recall:  Good  Fund of Knowledge:  Good  Language:  Good  Akathisia:  No  Handed:  Right  AIMS (if indicated):     Assets:  Agricultural consultant Housing Social Support  ADL's:  Intact  Cognition:  WNL  Sleep:        Demographic Factors:  Low socioeconomic status, Living alone and Unemployed  Loss Factors: Financial problems/change in socioeconomic status  Historical Factors: Impulsivity  Risk Reduction Factors:   Sense of responsibility to family  Continued Clinical Symptoms:  Depression:   Impulsivity  Cognitive Features That Contribute To Risk:  Closed-mindedness    Suicide Risk:  Minimal: No identifiable suicidal ideation.  Patients presenting  with no risk factors but with morbid ruminations; may be classified as minimal risk based on the severity of the depressive symptoms   Plan Of Care/Follow-up recommendations:  Activity:  as tolerated  Diet:  Heart healthy  Disposition: Discharge Home Take all medications as prescribed by your ACT team.  Medications:  -Symmetrel 100 mg BID -Brexpiprozole 2 mg daily and 4 mg at bedtime -Tegretol XR 400 mg at bedtime  - Haldol decanoate injection 100 mg every 28 days   Ethelene Hal, NP 11/06/2017, 11:58 AM  Patient seen face-to-face for psychiatric evaluation, chart reviewed and case discussed with the physician extender and developed treatment plan. Reviewed the information documented and agree with the treatment plan. Corena Pilgrim, MD

## 2017-11-11 ENCOUNTER — Emergency Department (HOSPITAL_COMMUNITY)
Admission: EM | Admit: 2017-11-11 | Discharge: 2017-11-12 | Disposition: A | Discharge status: Home / Self Care | Payer: Medicaid Other | Attending: Emergency Medicine | Admitting: Emergency Medicine

## 2017-11-11 ENCOUNTER — Other Ambulatory Visit: Payer: Self-pay

## 2017-11-11 DIAGNOSIS — F17210 Nicotine dependence, cigarettes, uncomplicated: Secondary | ICD-10-CM | POA: Diagnosis not present

## 2017-11-11 DIAGNOSIS — R441 Visual hallucinations: Secondary | ICD-10-CM | POA: Insufficient documentation

## 2017-11-11 DIAGNOSIS — Z79899 Other long term (current) drug therapy: Secondary | ICD-10-CM | POA: Diagnosis not present

## 2017-11-11 DIAGNOSIS — R440 Auditory hallucinations: Secondary | ICD-10-CM | POA: Diagnosis present

## 2017-11-11 DIAGNOSIS — R785 Finding of other psychotropic drug in blood: Secondary | ICD-10-CM | POA: Insufficient documentation

## 2017-11-11 DIAGNOSIS — F419 Anxiety disorder, unspecified: Secondary | ICD-10-CM | POA: Diagnosis not present

## 2017-11-11 DIAGNOSIS — F250 Schizoaffective disorder, bipolar type: Secondary | ICD-10-CM | POA: Diagnosis present

## 2017-11-11 DIAGNOSIS — F29 Unspecified psychosis not due to a substance or known physiological condition: Secondary | ICD-10-CM | POA: Diagnosis not present

## 2017-11-11 LAB — RAPID URINE DRUG SCREEN, HOSP PERFORMED
Amphetamines: NOT DETECTED
Barbiturates: NOT DETECTED
Benzodiazepines: NOT DETECTED
Cocaine: NOT DETECTED
Opiates: NOT DETECTED
Tetrahydrocannabinol: POSITIVE — AB

## 2017-11-11 LAB — COMPREHENSIVE METABOLIC PANEL
ALT: 10 U/L (ref 0–44)
AST: 13 U/L — ABNORMAL LOW (ref 15–41)
Albumin: 3.7 g/dL (ref 3.5–5.0)
Alkaline Phosphatase: 71 U/L (ref 38–126)
Anion gap: 9 (ref 5–15)
BUN: 11 mg/dL (ref 6–20)
CO2: 26 mmol/L (ref 22–32)
Calcium: 9.1 mg/dL (ref 8.9–10.3)
Chloride: 109 mmol/L (ref 98–111)
Creatinine, Ser: 0.95 mg/dL (ref 0.44–1.00)
GFR calc Af Amer: >60 mL/min (ref >60)
GFR calc non Af Amer: >60 mL/min (ref >60)
Glucose, Bld: 106 mg/dL — ABNORMAL HIGH (ref 70–99)
Potassium: 3.5 mmol/L (ref 3.5–5.1)
Sodium: 144 mmol/L (ref 135–145)
Total Bilirubin: 0.4 mg/dL (ref 0.3–1.2)
Total Protein: 7.8 g/dL (ref 6.5–8.1)

## 2017-11-11 LAB — PREGNANCY, URINE: Preg Test, Ur: NEGATIVE

## 2017-11-11 LAB — CBC WITH DIFFERENTIAL/PLATELET
Basophils Absolute: 0.1 10*3/uL (ref 0.0–0.1)
Basophils Relative: 1 %
Eosinophils Absolute: 0 10*3/uL (ref 0.0–0.7)
Eosinophils Relative: 1 %
HCT: 28.9 % — ABNORMAL LOW (ref 36.0–46.0)
Hemoglobin: 8.9 g/dL — ABNORMAL LOW (ref 12.0–15.0)
Lymphocytes Relative: 28 %
Lymphs Abs: 2.2 10*3/uL (ref 0.7–4.0)
MCH: 24 pg — ABNORMAL LOW (ref 26.0–34.0)
MCHC: 30.8 g/dL (ref 30.0–36.0)
MCV: 77.9 fL — ABNORMAL LOW (ref 78.0–100.0)
Monocytes Absolute: 0.6 10*3/uL (ref 0.1–1.0)
Monocytes Relative: 7 %
Neutro Abs: 5 10*3/uL (ref 1.7–7.7)
Neutrophils Relative %: 63 %
Platelets: 365 10*3/uL (ref 150–400)
RBC: 3.71 MIL/uL — ABNORMAL LOW (ref 3.87–5.11)
RDW: 19.4 % — ABNORMAL HIGH (ref 11.5–15.5)
WBC: 7.9 10*3/uL (ref 4.0–10.5)

## 2017-11-11 LAB — ETHANOL: Alcohol, Ethyl (B): <10 mg/dL (ref <10)

## 2017-11-11 LAB — ACETAMINOPHEN LEVEL: Acetaminophen (Tylenol), Serum: <10 ug/mL — ABNORMAL LOW (ref 10–30)

## 2017-11-11 LAB — SALICYLATE LEVEL: Salicylate Lvl: <7 mg/dL (ref 2.8–30.0)

## 2017-11-11 MED ORDER — ALUM & MAG HYDROXIDE-SIMETH 200-200-20 MG/5ML PO SUSP: 30.0000 mL | Freq: Four times a day (QID) | ORAL | Status: DC | PRN

## 2017-11-11 MED ORDER — ZOLPIDEM TARTRATE 5 MG PO TABS
5.0000 mg | ORAL_TABLET | Freq: Every evening | ORAL | Status: DC | PRN
  Administered 2017-11-11: 5 mg via ORAL
  Filled 2017-11-11: qty 1

## 2017-11-11 NOTE — ED Notes (Signed)
Refusing to cooperate- will not allow blood draw, give urine sample or answer questions. MD made aware.

## 2017-11-11 NOTE — BH Assessment (Signed)
[place] Assessment Progress Note  Case was staffed with [name] FNP who recommended patient be observed and monitored for safety. Patient will also be evaluated for possible medication interventions. Patient will be seen by psychiatry in the a.m.

## 2017-11-11 NOTE — ED Notes (Addendum)
Spoke with pt about the purpose of the TCU and medical clearance. Pt states that she is at the hospital because "she feels a lot of grief and suffering that has been bottled up for a long time. Not over a person in particular". Pt with mild avoidance to questions. Delayed answers to more direct questions. She refuses to allow staff to perform and EKG at this time saying, "maybe in the morning because I am just getting to the hospital tonight"

## 2017-11-11 NOTE — ED Triage Notes (Signed)
Pt comes in with a ACT member and states that she was recently seen for her schizoaffective disorder.  Pt states she left prior to her treatment being completed.  Pt is not expressive in what is going on today and obtain a history from the pt is difficult.  ACT member with pt states that the pt has not expressed SI or HI.  Pt does endorse A/V hallucinations.

## 2017-11-11 NOTE — ED Provider Notes (Signed)
[facility] DEPT Provider Note   CSN: [id number] Arrival date & time: 11/11/17  1644     History   Chief Complaint Chief Complaint  Patient presents with  . Medical Clearance    HPI [name] is a 47 y.o. female with history of bipolar 1 disorder, schizoaffective disorder, depression presents for evaluation brought in by a member of the ACT team. It is difficult to obtain a history from the patient.  She is extremely slow to answer questions.  She states that she has been feeling  "Extreme grief.  I am crying all the time." She denies suicidal ideation, homicidal ideation, or auditory or visual hallucinations.  However, triage note does state that she endorses auditory and visual hallucinations.  She denies any medical complaints at this time.   The history is provided by the patient. The history is limited by the condition of the patient.    Past Medical History:  Diagnosis Date  . Arthritis   . Bipolar 1 disorder ([place])   . Bronchitis   . Depression   . Hemorrhoid   . Schizoaffective disorder ([place])   . Schizophrenia ([place])    Pt denies and reports it is schizoaffective disorder.   . Transfusion history     Patient Active Problem List   Diagnosis Date Noted  . No diagnosis on Axis I 08/21/2015  . Abnormal behavior   . Involuntary commitment   . Schizophrenia ([place])   . Disturbance in affect   . Tobacco use disorder 05/12/2015  . Cannabis use disorder, moderate, dependence ([place]) 05/12/2015  . Schizo affective schizophrenia ([place]) 05/10/2015  . Hypertension 02/24/2011  . ANEMIA-IRON DEFICIENCY 12/13/2006  . GERD 12/13/2006  . OBESITY 12/12/2006  . Schizoaffective disorder, bipolar type ([place]) 12/12/2006  . HEMORRHOIDS, NOS 12/12/2006  . PAIN-NECK 12/12/2006  . TREMOR 12/12/2006    Past Surgical History:  Procedure Laterality Date  . CESAREAN SECTION       OB History   None      Home Medications    Prior to Admission  medications   Medication Sig Start Date End Date Taking? Authorizing Provider  amantadine (SYMMETREL) 100 MG capsule Take 1 capsule (100 mg total) by mouth 2 (two) times daily. 02/03/17   McNew, Tyson Babinski, MD  Brexpiprazole (REXULTI) 4 MG TABS Take 2-4 mg by mouth See admin instructions. 2mg  QAM, 4mg  QHS    [provider]  carbamazepine (TEGRETOL XR) 400 MG 12 hr tablet Take 400 mg by mouth at bedtime.    [provider]  haloperidol decanoate (HALDOL DECANOATE) 100 MG/ML injection Inject 0.5 mLs (50 mg total) into the muscle every 28 (twenty-eight) days. 02/24/17   McNew, Tyson Babinski, MD  potassium chloride SA (K-DUR,KLOR-CON) 20 MEQ tablet Take 2 tablets (40 mEq total) by mouth 2 (two) times daily for 7 days. 10/27/17 11/03/17  Couture, Cortni S, PA-C  traZODone (DESYREL) 100 MG tablet Take 1 tablet (100 mg total) by mouth at bedtime. 02/03/17   McNew, Tyson Babinski, MD    Family History Family History  Problem Relation Age of Onset  . Diabetes type II Other     Social History Social History   Tobacco Use  . Smoking status: Current Every Day Smoker    Packs/day: 0.50    Years: 17.00    Pack years: 8.50    Types: Cigarettes  . Smokeless tobacco: Never Used  Substance Use Topics  . Alcohol use: No  . Drug use:  No    Types: Marijuana    Comment: Former user     Allergies   Latuda [lurasidone hcl]; Abilify [aripiprazole]; Flagyl [metronidazole hcl]; Prolixin [fluphenazine]; Risperidone and related; Seroquel [quetiapine fumarate]; and Sulfa antibiotics   Review of Systems Review of Systems  Constitutional: Negative for chills and fever.  Psychiatric/Behavioral: Positive for dysphoric mood. Negative for hallucinations and suicidal ideas.  All other systems reviewed and are negative.    Physical Exam Updated Vital Signs BP (!) 134/59 (BP Location: Left Arm)   Pulse 65   Temp 98.1 F (36.7 C) (Oral)   Resp 17   SpO2 100%   Physical Exam  Constitutional: She  appears well-developed and well-nourished. No distress.  HENT:  Head: Normocephalic and atraumatic.  Eyes: Conjunctivae are normal. Right eye exhibits no discharge. Left eye exhibits no discharge.  Neck: Normal range of motion. Neck supple. No JVD present. No tracheal deviation present.  Cardiovascular: Normal rate, regular rhythm and normal heart sounds.  Pulmonary/Chest: Effort normal and breath sounds normal. No stridor. No respiratory distress. She has no wheezes. She has no rales. She exhibits no tenderness.  Abdominal: Soft. Bowel sounds are normal. She exhibits no distension. There is no tenderness.  Musculoskeletal: She exhibits no edema.  Neurological: She is alert.  Skin: Skin is warm and dry. No erythema.  Psychiatric: Her affect is blunt. Her speech is delayed. She is withdrawn. She expresses no homicidal and no suicidal ideation. She expresses no suicidal plans and no homicidal plans.  Nursing note and vitals reviewed.    ED Treatments / Results  Labs (all labs ordered are listed, but only abnormal results are displayed) Labs Reviewed  COMPREHENSIVE METABOLIC PANEL - Abnormal; Notable for the following components:      Result Value   Glucose, Bld 106 (*)    AST 13 (*)    All other components within normal limits  RAPID URINE DRUG SCREEN, HOSP PERFORMED - Abnormal; Notable for the following components:   Tetrahydrocannabinol POSITIVE (*)    All other components within normal limits  CBC WITH DIFFERENTIAL/PLATELET - Abnormal; Notable for the following components:   RBC 3.71 (*)    Hemoglobin 8.9 (*)    HCT 28.9 (*)    MCV 77.9 (*)    MCH 24.0 (*)    RDW 19.4 (*)    All other components within normal limits  ACETAMINOPHEN LEVEL - Abnormal; Notable for the following components:   Acetaminophen (Tylenol), Serum <10 (*)    All other components within normal limits  ETHANOL  SALICYLATE LEVEL  PREGNANCY, URINE    EKG None  Radiology No results  found.  Procedures Procedures (including critical care time)  Medications Ordered in ED Medications  alum & mag hydroxide-simeth (MAALOX/MYLANTA) 200-200-20 MG/5ML suspension 30 mL (has no administration in time range)  zolpidem (AMBIEN) tablet 5 mg (has no administration in time range)     Initial Impression / Assessment and Plan / ED Course  I have reviewed the triage vital signs and the nursing notes.  Pertinent labs & imaging results that were available during my care of the patient were reviewed by me and considered in my medical decision making (see chart for details).     Patient presents for psychiatric evaluation.  She is afebrile, vital signs are stable.  She is nontoxic in appearance.  There is difficult to obtain a history from her.  She is slow to answer questions.  Physical examination is reassuring.  Lab work reviewed  by me is reassuring.  UDS is positive for THC.  She is medically clear for TTS evaluation at this time.  6:30PM TTS recommends overnight observation and a.m. psych evaluation.  Final Clinical Impressions(s) / ED Diagnoses   Final diagnoses:  Schizoaffective disorder, bipolar type [facility])    ED Discharge Orders    None       Debroah Baller 11/11/17 2250    Isla Pence, MD 11/11/17 2318

## 2017-11-11 NOTE — ED Notes (Signed)
Pt A&O x 3, not forthcoming with information, presents with complaint of grief and suffering, pt resting at present, no distress noted, calm at present.  Monitoring for safety, Q 15 min checks in effect.

## 2017-11-11 NOTE — BH Assessment (Addendum)
Assessment Note  [name] is an 47 y.o. female that presents this date for symptoms associated with her Schizoaffective disorder. Patient is very disorganized and appears to be responding to internal stimuli as evidenced by patient pointing to different objects in the room and not responding when asked to elaborate. Patient speaks in a soft slow voice and will only respond to certain questions as this writer attempts to gather history. Patient states "no sir" when asked if she is has thoughts of self harm. Patient denies any H/I although will not answer when asked in reference to any AVH. Per note review patient did report active AVH on admission earlier this date. Patient stares at this writer and states "that's all" as she covers her head up with her blanket. Patient declines to participate any further in the assessment. Information to complete assessment was obtained from admission notes and prior history. Per chart review patient was last seen at [facility] on 10/27/17 presenting at that time with similar symptoms. This date, "Patient this date is brought in by a ACT member Licensed conveyancer) and states that she was recently seen here for her schizoaffective disorder. Patient  is not expressive in what is going on today and obtaining a history from the patient is difficult. ACT member with patient states that the patient has not expressed SI or HI. Patient does endorse A/V hallucinations. Patient has a history of Bipolar 1 disorder, schizoaffective disorder and presents for evaluation brought in by a member of the ACT team. It is difficult to obtain a history from the patient. She is extremely slow to answer questions. She states that she has been feeling  "Extreme grief crying all the time." She denies suicidal ideation, homicidal ideation, or auditory or visual hallucinations. However, triage note does state that she endorses auditory and visual hallucinations". Case was staffed with Patricia Garret FNP who recommended  patient be observed and monitored for safety. Patient will also be evaluated for possible medication interventions. Patient will be seen by psychiatry in the a.m.       Diagnosis: F25.0 Schizoaffective disorder, Bipolar type  Past Medical History:  Past Medical History:  Diagnosis Date  . Arthritis   . Bipolar 1 disorder ([place])   . Bronchitis   . Depression   . Hemorrhoid   . Schizoaffective disorder ([place])   . Schizophrenia ([place])    Pt denies and reports it is schizoaffective disorder.   . Transfusion history     Past Surgical History:  Procedure Laterality Date  . CESAREAN SECTION      Family History:  Family History  Problem Relation Age of Onset  . Diabetes type II Other     Social History:  reports that she has been smoking cigarettes.  She has a 8.50 pack-year smoking history. She has never used smokeless tobacco. She reports that she does not drink alcohol or use drugs.  Additional Social History:  Alcohol / Drug Use Pain Medications: denies Prescriptions: denies Over the Counter: denies History of alcohol / drug use?: No history of alcohol / drug abuse Longest period of sobriety (when/how long): NA Negative Consequences of Use: (NA) Withdrawal Symptoms: (NA)  CIWA: CIWA-Ar BP: 112/77 Pulse Rate: 82 COWS:    Allergies:  Allergies  Allergen Reactions  . [name] [Lurasidone Hcl] Nausea And Vomiting and Other (See Comments)    Reaction:  Shaking   . Abilify [Aripiprazole] Other (See Comments)    shaking  . Flagyl [Metronidazole Hcl] Nausea And Vomiting  . Prolixin [  Fluphenazine] Other (See Comments)    shaking  . Risperidone And Related Other (See Comments)    shaking  . Seroquel [Quetiapine Fumarate]     Patient states she had a syncopal episode after taking medication.   . Sulfa Antibiotics Nausea And Vomiting    Home Medications:  (Not in a hospital admission)  OB/GYN Status:  No LMP recorded.  General Assessment Data Location of Assessment: [facility]  ED TTS Assessment: In system Is this a Tele or Face-to-Face Assessment?: Face-to-Face Is this an Initial Assessment or a Re-assessment for this encounter?: Initial Assessment Marital status: Single Maiden name: (NA) Is patient pregnant?: No Pregnancy Status: No Living Arrangements: Children Can pt return to current living arrangement?: Yes Admission Status: Voluntary Is patient capable of signing voluntary admission?: Yes Referral Source: Self/Family/Friend Insurance type: Medicare  Medical Screening Exam ([place]) Medical Exam completed: Yes  Crisis Care Plan Living Arrangements: Children Legal Guardian: (None) Name of Psychiatrist: Jake [name] Name of Therapist: [facility]  Education Status Is patient currently in school?: No Is the patient employed, unemployed or receiving disability?: Receiving disability income  Risk to self with the past 6 months Suicidal Ideation: No Has patient been a risk to self within the past 6 months prior to admission? : No Suicidal Intent: No Has patient had any suicidal intent within the past 6 months prior to admission? : No Is patient at risk for suicide?: Yes Suicidal Plan?: No Has patient had any suicidal plan within the past 6 months prior to admission? : No Access to Means: No What has been your use of drugs/alcohol within the last 12 months?: Denies Previous Attempts/Gestures: No How many times?: 0 Other Self Harm Risks: None Triggers for Past Attempts: Unknown Intentional Self Injurious Behavior: None Family Suicide History: No Recent stressful life event(s): Other (Comment)(Medication noncompliance) Persecutory voices/beliefs?: No Depression: Yes Depression Symptoms: (Pt is vague in reference to symptoms) Substance abuse history and/or treatment for substance abuse?: No Suicide prevention information given to non-admitted patients: Not applicable  Risk to Others within the past 6 months Homicidal Ideation:  No Does patient have any lifetime risk of violence toward others beyond the six months prior to admission? : No Thoughts of Harm to Others: No Current Homicidal Intent: No Current Homicidal Plan: No Access to Homicidal Means: No Identified Victim: NA History of harm to others?: No Assessment of Violence: None Noted Violent Behavior Description: NA Does patient have access to weapons?: No Criminal Charges Pending?: No Does patient have a court date: No Is patient on probation?: No  Psychosis Hallucinations: (UTA) Delusions: None noted  Mental Status Report Appearance/Hygiene: In scrubs Eye Contact: Poor Motor Activity: Freedom of movement Speech: Soft, Slow Level of Consciousness: Quiet/awake Mood: Depressed Affect: Flat Anxiety Level: Minimal Thought Processes: Unable to Assess Judgement: Impaired Orientation: Unable to assess Obsessive Compulsive Thoughts/Behaviors: Unable to Assess  Cognitive Functioning Concentration: Unable to Assess Memory: Unable to Assess Is patient IDD: No Is patient DD?: No Insight: Unable to Assess Impulse Control: Unable to Assess Appetite: (UTA) Have you had any weight changes? : (UTA) Sleep: (UTA) Total Hours of Sleep: (UTA) Vegetative Symptoms: (UTA)  ADLScreening [facility] Assessment Services) Patient's cognitive ability adequate to safely complete daily activities?: Yes Patient able to express need for assistance with ADLs?: Yes Independently performs ADLs?: Yes (appropriate for developmental age)  Prior Inpatient Therapy Prior Inpatient Therapy: Yes Prior Therapy Dates: 2018 Prior Therapy Facilty/Provider(s): [facility] Reason for Treatment: MH issues  Prior Outpatient Therapy Prior Outpatient  Therapy: Yes Prior Therapy Dates: Ongoing Prior Therapy Facilty/Provider(s): Patricia Samples MD Reason for Treatment: MH issues Does patient have an ACCT team?: Yes Does patient have Intensive In-House Services?  : No Does patient have [facility]  services? : No Does patient have P4CC services?: No  ADL Screening (condition at time of admission) Patient's cognitive ability adequate to safely complete daily activities?: Yes Is the patient deaf or have difficulty hearing?: No Does the patient have difficulty seeing, even when wearing glasses/contacts?: No Does the patient have difficulty concentrating, remembering, or making decisions?: Yes Patient able to express need for assistance with ADLs?: Yes Does the patient have difficulty dressing or bathing?: No Independently performs ADLs?: Yes (appropriate for developmental age) Does the patient have difficulty walking or climbing stairs?: No Weakness of Legs: None Weakness of Arms/Hands: None  Home Assistive Devices/Equipment Home Assistive Devices/Equipment: None  Therapy Consults (therapy consults require a physician order) PT Evaluation Needed: No OT Evalulation Needed: No SLP Evaluation Needed: No Abuse/Neglect Assessment (Assessment to be complete while patient is alone) Physical Abuse: Denies Verbal Abuse: Denies Sexual Abuse: Denies Exploitation of patient/patient's resources: Denies Self-Neglect: Denies Values / Beliefs Cultural Requests During Hospitalization: None Spiritual Requests During Hospitalization: None Consults Spiritual Care Consult Needed: No Social Work Consult Needed: No Regulatory affairs officer (For Healthcare) Does Patient Have a Medical Advance Directive?: No Would patient like information on creating a medical advance directive?: No - Patient declined    Additional Information 1:1 In Past 12 Months?: No CIRT Risk: No Elopement Risk: No Does patient have medical clearance?: Yes     Disposition:  Case was staffed with Patricia Garret FNP who recommended patient be observed and monitored for safety. Patient will also be evaluated for possible medication interventions. Patient will be seen by psychiatry in the a.m.      Disposition Initial Assessment  Completed for this Encounter: Yes Disposition of Patient: (Observe and monitor for safety) Patient refused recommended treatment: No Mode of transportation if patient is discharged?: Tomasita Crumble)  On Site Evaluation by:   Reviewed with Physician:    Mamie Nick 11/11/2017 6:22 PM

## 2017-11-11 NOTE — ED Notes (Signed)
Report given to [place], Therapist, sports. Security to transport pt

## 2017-11-12 DIAGNOSIS — F17210 Nicotine dependence, cigarettes, uncomplicated: Secondary | ICD-10-CM

## 2017-11-12 DIAGNOSIS — F419 Anxiety disorder, unspecified: Secondary | ICD-10-CM

## 2017-11-12 DIAGNOSIS — F250 Schizoaffective disorder, bipolar type: Secondary | ICD-10-CM

## 2017-11-12 DIAGNOSIS — F29 Unspecified psychosis not due to a substance or known physiological condition: Secondary | ICD-10-CM

## 2017-11-12 MED ORDER — HALOPERIDOL 5 MG PO TABS
5.0000 mg | ORAL_TABLET | Freq: Two times a day (BID) | ORAL | Status: DC
  Administered 2017-11-12: 5 mg via ORAL
  Filled 2017-11-12: qty 1

## 2017-11-12 MED ORDER — BREXPIPRAZOLE 2 MG PO TABS: 4.0000 mg | ORAL_TABLET | Freq: Every day | ORAL | Status: DC

## 2017-11-12 MED ORDER — AMANTADINE HCL 100 MG PO CAPS: 100.0000 mg | ORAL_CAPSULE | Freq: Two times a day (BID) | ORAL | Status: DC

## 2017-11-12 MED ORDER — TRAZODONE HCL 100 MG PO TABS: 100.0000 mg | ORAL_TABLET | Freq: Every day | ORAL | Status: DC

## 2017-11-12 MED ORDER — CARBAMAZEPINE ER 200 MG PO TB12: 400.0000 mg | ORAL_TABLET | Freq: Every day | ORAL | Status: DC

## 2017-11-12 MED ORDER — BREXPIPRAZOLE 2 MG PO TABS
2.0000 mg | ORAL_TABLET | Freq: Every day | ORAL | Status: DC
  Filled 2017-11-12: qty 1

## 2017-11-12 MED ORDER — BREXPIPRAZOLE 4 MG PO TABS
2.0000 mg | ORAL_TABLET | ORAL | Status: DC
Start: 2017-11-12 — End: 2017-11-12

## 2017-11-12 NOTE — Progress Notes (Signed)
CSW made aware of patient's readiness for discharge and possible involvement with [facility] in [place].  CSW attempted contact with [facility] [phone]). The ACT Team office does not operate on weekends. No option to leave a voicemail.  Stephanie Acre, [place] Social Worker [phone]

## 2017-11-12 NOTE — [facility] Suicide Risk Assessment (Signed)
Suicide Risk Assessment  Discharge Assessment   [facility] Discharge Suicide Risk Assessment   Principal Problem: Schizoaffective disorder, bipolar type [facility]) Discharge Diagnoses:  Patient Active Problem List   Diagnosis Date Noted  . Schizoaffective disorder, bipolar type ([place]) [F25.0] 12/12/2006    Priority: High  . Cannabis use disorder, moderate, dependence ([place]) [F12.20] 05/12/2015    Priority: Low  . No diagnosis on Axis I [Z03.89] 08/21/2015  . Abnormal behavior [R46.89]   . Involuntary commitment [Z04.6]   . Schizophrenia ([place]) [F20.9]   . Disturbance in affect [R45.89]   . Tobacco use disorder [F17.200] 05/12/2015  . Schizo affective schizophrenia ([place]) [F25.0] 05/10/2015  . Hypertension [I10] 02/24/2011  . ANEMIA-IRON DEFICIENCY [D50.9] 12/13/2006  . GERD [K21.9] 12/13/2006  . OBESITY [E66.9] 12/12/2006  . HEMORRHOIDS, NOS [K64.9] 12/12/2006  . PAIN-NECK [M54.2] 12/12/2006  . TREMOR [R25.9] 12/12/2006    Total Time spent with patient: 45 minutes  Musculoskeletal: Strength & Muscle Tone: within normal limits Gait & Station: normal Patient leans: N/A  Psychiatric Specialty Exam: Physical Exam  Nursing note and vitals reviewed. Constitutional: She appears well-developed and well-nourished.  HENT:  Head: Normocephalic.  Neck: Normal range of motion.  Respiratory: Effort normal.  Musculoskeletal: Normal range of motion.  Neurological: She is alert.  Psychiatric: Her speech is normal and behavior is normal. Judgment and thought content normal. Her mood appears anxious. Cognition and memory are normal.    Review of Systems  Psychiatric/Behavioral: The patient is nervous/anxious.   All other systems reviewed and are negative.   Blood pressure (!) 134/59, pulse 65, temperature 98.1 F (36.7 C), temperature source Oral, resp. rate 17, SpO2 100 %.There is no height or weight on file to calculate BMI.  General Appearance: Casual  Eye Contact:  Good  Speech:  Normal Rate   Volume:  Normal  Mood:  Anxious  Affect:  Congruent  Thought Process:  Coherent and Descriptions of Associations: Intact  Orientation:  Full (Time, Place, and Person)  Thought Content:  WDL and Logical  Suicidal Thoughts:  No  Homicidal Thoughts:  No  Memory:  Immediate;   Good Recent;   Good Remote;   Good  Judgement:  Fair  Insight:  Fair  Psychomotor Activity:  Normal  Concentration:  Concentration: Good and Attention Span: Good  Recall:  Good  Fund of Knowledge:  Fair  Language:  Good  Akathisia:  No  Handed:  Right  AIMS (if indicated):     Assets:  Leisure Time Physical Health Resilience Social Support  ADL's:  Intact  Cognition:  WNL  Sleep:      Mental Status Per Nursing Assessment::   On Admission:   hallucinations  Demographic Factors:  NA  Loss Factors: NA  Historical Factors: NA  Risk Reduction Factors:   Sense of responsibility to family, Living with another person, especially a relative and Positive social support  Continued Clinical Symptoms:  Anxiety, mild  Cognitive Features That Contribute To Risk:  None    Suicide Risk:  Minimal: No identifiable suicidal ideation.  Patients presenting with no risk factors but with morbid ruminations; may be classified as minimal risk based on the severity of the depressive symptoms    Plan Of Care/Follow-up recommendations:  Activity:  as tolerated  Diet:  heart healthy diet  Devlin Brink, NP 11/12/2017, 10:44 AM

## 2017-11-12 NOTE — ED Notes (Signed)
Pt discharged home. Discharged instructions read to pt who verbalized understanding. All belongings returned to pt who signed for same. Denies SI/HI, is not delusional and not responding to internal stimuli. Escorted pt to the ED exit.  Pt given bus pass.

## 2017-11-12 NOTE — Consult Note (Addendum)
[facility] Consult   Reason for Consult:  Hallucinations  Referring Physician:  EDP Patient Identification: [name] MRN:  [id number] Principal Diagnosis: Schizoaffective disorder, bipolar type [facility]) Diagnosis:   Patient Active Problem List   Diagnosis Date Noted  . Schizoaffective disorder, bipolar type ([place]) [F25.0] 12/12/2006    Priority: High  . Cannabis use disorder, moderate, dependence ([place]) [F12.20] 05/12/2015    Priority: Low  . No diagnosis on Axis I [Z03.89] 08/21/2015  . Abnormal behavior [R46.89]   . Involuntary commitment [Z04.6]   . Schizophrenia ([place]) [F20.9]   . Disturbance in affect [R45.89]   . Tobacco use disorder [F17.200] 05/12/2015  . Schizo affective schizophrenia ([place]) [F25.0] 05/10/2015  . Hypertension [I10] 02/24/2011  . ANEMIA-IRON DEFICIENCY [D50.9] 12/13/2006  . GERD [K21.9] 12/13/2006  . OBESITY [E66.9] 12/12/2006  . HEMORRHOIDS, NOS [K64.9] 12/12/2006  . PAIN-NECK [M54.2] 12/12/2006  . TREMOR [R25.9] 12/12/2006    Total Time spent with patient: 45 minutes  Subjective:   [name] is a 47 y.o. female patient does not warrant admission.  HPI:  47 yo female who came to the ED with hallucinations.  Reports her symptoms started yesterday and improved with rest here in the  ED.  Stress makes her symptoms worse.She had her long term antipsychotic injection this week and has an ACT team.  Volanda is well known to this ED and providers.  When she gets stressed, she often comes here to get away from things.  No suicidal/homicidal ideations, hallucinations, or substance abuse.  Stable for discharge.  Past Psychiatric History: schizoaffective disorder, bipolar type  Risk to Self: None Risk to Others: Homicidal Ideation: No Thoughts of Harm to Others: No Current Homicidal Intent: No Current Homicidal Plan: No Access to Homicidal Means: No Identified Victim: NA History of harm to others?: No Assessment of Violence:  None Noted Violent Behavior Description: NA Does patient have access to weapons?: No Criminal Charges Pending?: No Does patient have a court date: No Prior Inpatient Therapy: Prior Inpatient Therapy: Yes Prior Therapy Dates: 2018 Prior Therapy Facilty/Provider(s): [facility] Reason for Treatment: MH issues Prior Outpatient Therapy: Prior Outpatient Therapy: Yes Prior Therapy Dates: Ongoing Prior Therapy Facilty/Provider(s): [name] MD Reason for Treatment: MH issues Does patient have an ACCT team?: Yes Does patient have Intensive In-House Services?  : No Does patient have [facility] services? : No Does patient have P4CC services?: No  Past Medical History:  Past Medical History:  Diagnosis Date  . Arthritis   . Bipolar 1 disorder ([place])   . Bronchitis   . Depression   . Hemorrhoid   . Schizoaffective disorder ([place])   . Schizophrenia ([place])    Pt denies and reports it is schizoaffective disorder.   . Transfusion history     Past Surgical History:  Procedure Laterality Date  . CESAREAN SECTION     Family History:  Family History  Problem Relation Age of Onset  . Diabetes type II Other    Family Psychiatric  History: none Social History:  Social History   Substance and Sexual Activity  Alcohol Use No     Social History   Substance and Sexual Activity  Drug Use No  . Types: Marijuana   Comment: Former user    Social History   Socioeconomic History  . Marital status: Legally Separated    Spouse name: Not on file  . Number of children: Not on file  . Years of education: Not on file  . Highest education  level: Not on file  Occupational History  . Not on file  Social Needs  . Financial resource strain: Not on file  . Food insecurity:    Worry: Not on file    Inability: Not on file  . Transportation needs:    Medical: Not on file    Non-medical: Not on file  Tobacco Use  . Smoking status: Current Every Day Smoker    Packs/day: 0.50    Years: 17.00    Pack years:  8.50    Types: Cigarettes  . Smokeless tobacco: Never Used  Substance and Sexual Activity  . Alcohol use: No  . Drug use: No    Types: Marijuana    Comment: Former user  . Sexual activity: Never  Lifestyle  . Physical activity:    Days per week: Not on file    Minutes per session: Not on file  . Stress: Not on file  Relationships  . Social connections:    Talks on phone: Not on file    Gets together: Not on file    Attends religious service: Not on file    Active member of club or organization: Not on file    Attends meetings of clubs or organizations: Not on file    Relationship status: Not on file  Other Topics Concern  . Not on file  Social History Narrative   ** Merged History Encounter **       Additional Social History:    Allergies:   Allergies  Allergen Reactions  . Anette Guarneri [Lurasidone Hcl] Nausea And Vomiting and Other (See Comments)    Reaction:  Shaking   . Abilify [Aripiprazole] Other (See Comments)    shaking  . Flagyl [Metronidazole Hcl] Nausea And Vomiting  . Prolixin [Fluphenazine] Other (See Comments)    shaking  . Risperidone And Related Other (See Comments)    shaking  . Seroquel [Quetiapine Fumarate]     Patient states she had a syncopal episode after taking medication.   . Sulfa Antibiotics Nausea And Vomiting    Labs:  Results for orders placed or performed during the hospital encounter of 11/11/17 (from the past 48 hour(s))  Comprehensive metabolic panel     Status: Abnormal   Collection Time: 11/11/17  6:10 PM  Result Value Ref Range   Sodium 144 135 - 145 mmol/L   Potassium 3.5 3.5 - 5.1 mmol/L   Chloride 109 98 - 111 mmol/L   CO2 26 22 - 32 mmol/L   Glucose, Bld 106 (H) 70 - 99 mg/dL   BUN 11 6 - 20 mg/dL   Creatinine, Ser 0.95 0.44 - 1.00 mg/dL   Calcium 9.1 8.9 - 10.3 mg/dL   Total Protein 7.8 6.5 - 8.1 g/dL   Albumin 3.7 3.5 - 5.0 g/dL   AST 13 (L) 15 - 41 U/L   ALT 10 0 - 44 U/L   Alkaline Phosphatase 71 38 - 126 U/L    Total Bilirubin 0.4 0.3 - 1.2 mg/dL   GFR calc non Af Amer >60 >60 mL/min   GFR calc Af Amer >60 >60 mL/min    Comment: (NOTE) The eGFR has been calculated using the CKD EPI equation. This calculation has not been validated in all clinical situations. eGFR's persistently <60 mL/min signify possible Chronic Kidney Disease.    Anion gap 9 5 - 15    Comment: Performed at [facility], [place] [address], [place], [place] [address]  Ethanol  Status: None   Collection Time: 11/11/17  6:10 PM  Result Value Ref Range   Alcohol, Ethyl (B) <10 <10 mg/dL    Comment: (NOTE) Lowest detectable limit for serum alcohol is 10 mg/dL. For medical purposes only. Performed at [facility], [address] [place], [place] [address]   Urine rapid drug screen (hosp performed)     Status: Abnormal   Collection Time: 11/11/17  6:10 PM  Result Value Ref Range   Opiates NONE DETECTED NONE DETECTED   Cocaine NONE DETECTED NONE DETECTED   Benzodiazepines NONE DETECTED NONE DETECTED   Amphetamines NONE DETECTED NONE DETECTED   Tetrahydrocannabinol POSITIVE (A) NONE DETECTED   Barbiturates NONE DETECTED NONE DETECTED    Comment: (NOTE) DRUG SCREEN FOR MEDICAL PURPOSES ONLY.  IF CONFIRMATION IS NEEDED FOR ANY PURPOSE, NOTIFY LAB WITHIN 5 DAYS. LOWEST DETECTABLE LIMITS FOR URINE DRUG SCREEN Drug Class                     Cutoff (ng/mL) Amphetamine and metabolites    1000 Barbiturate and metabolites    200 Benzodiazepine                 200 Tricyclics and metabolites     300 Opiates and metabolites        300 Cocaine and metabolites        300 THC                            50 Performed at [facility], [address] [place], [place] [address]   CBC with Diff     Status: Abnormal   Collection Time: 11/11/17  6:10 PM  Result Value Ref Range   WBC 7.9 4.0 - 10.5 K/uL   RBC 3.71 (L) 3.87 - 5.11 MIL/uL   Hemoglobin 8.9 (L) 12.0 - 15.0 g/dL    HCT 28.9 (L) 36.0 - 46.0 %   MCV 77.9 (L) 78.0 - 100.0 fL   MCH 24.0 (L) 26.0 - 34.0 pg   MCHC 30.8 30.0 - 36.0 g/dL   RDW 19.4 (H) 11.5 - 15.5 %   Platelets 365 150 - 400 K/uL   Neutrophils Relative % 63 %   Neutro Abs 5.0 1.7 - 7.7 K/uL   Lymphocytes Relative 28 %   Lymphs Abs 2.2 0.7 - 4.0 K/uL   Monocytes Relative 7 %   Monocytes Absolute 0.6 0.1 - 1.0 K/uL   Eosinophils Relative 1 %   Eosinophils Absolute 0.0 0.0 - 0.7 K/uL   Basophils Relative 1 %   Basophils Absolute 0.1 0.0 - 0.1 K/uL    Comment: Performed at [facility], [address] [place], [place] [address]  Salicylate level     Status: None   Collection Time: 11/11/17  6:10 PM  Result Value Ref Range   Salicylate Lvl <7.0 2.8 - 30.0 mg/dL    Comment: Performed at [facility], [address] [place], [place] [address]  Acetaminophen level     Status: Abnormal   Collection Time: 11/11/17  6:10 PM  Result Value Ref Range   Acetaminophen (Tylenol), Serum <10 (L) 10 - 30 ug/mL    Comment: (NOTE) Therapeutic concentrations vary significantly. A range of 10-30 ug/mL  may be an effective concentration for many patients. However, some  are best treated at concentrations outside of this range. Acetaminophen concentrations >150 ug/mL at 4   hours after ingestion  and >50 ug/mL at 12 hours after ingestion are often associated with  toxic reactions. Performed at [facility], [address] [place], [place] [address]   Pregnancy, urine     Status: None   Collection Time: 11/11/17  6:10 PM  Result Value Ref Range   Preg Test, Ur NEGATIVE NEGATIVE    Comment:        THE SENSITIVITY OF THIS METHODOLOGY IS >20 mIU/mL. Performed at [facility], [address] , [place] [address]     Current Facility-Administered Medications  Medication Dose Route Frequency Provider Last Rate Last Dose  . alum & mag hydroxide-simeth (MAALOX/MYLANTA)  200-200-20 MG/5ML suspension 30 mL  30 mL Oral Q6H PRN Fawze, Mina A, PA-C      . Brexpiprazole TABS 2 mg  2 mg Oral Daily Lord, Jamison Y, NP      . Brexpiprazole TABS 4 mg  4 mg Oral QHS Lord, Jamison Y, NP      . carbamazepine (TEGRETOL XR) 12 hr tablet 400 mg  400 mg Oral QHS Lord, Jamison Y, NP      . haloperidol (HALDOL) tablet 5 mg  5 mg Oral BID Lord, Jamison Y, NP   5 mg at 11/12/17 1015  . traZODone (DESYREL) tablet 100 mg  100 mg Oral QHS Lord, Jamison Y, NP       Current Outpatient Medications  Medication Sig Dispense Refill  . amantadine (SYMMETREL) 100 MG capsule Take 1 capsule (100 mg total) by mouth 2 (two) times daily. 60 capsule 0  . Brexpiprazole (REXULTI) 4 MG TABS Take 2-4 mg by mouth See admin instructions. 2mg QAM, 4mg QHS    . carbamazepine (TEGRETOL XR) 400 MG 12 hr tablet Take 400 mg by mouth at bedtime.    . haloperidol decanoate (HALDOL DECANOATE) 100 MG/ML injection Inject 0.5 mLs (50 mg total) into the muscle every 28 (twenty-eight) days. 1 mL 0  . potassium chloride SA (K-DUR,KLOR-CON) 20 MEQ tablet Take 2 tablets (40 mEq total) by mouth 2 (two) times daily for 7 days. 28 tablet 0  . traZODone (DESYREL) 100 MG tablet Take 1 tablet (100 mg total) by mouth at bedtime. 30 tablet 0    Musculoskeletal: Strength & Muscle Tone: within normal limits Gait & Station: normal Patient leans: N/A  Psychiatric Specialty Exam: Physical Exam  Nursing note and vitals reviewed. Constitutional: She appears well-developed and well-nourished.  HENT:  Head: Normocephalic.  Neck: Normal range of motion.  Respiratory: Effort normal.  Musculoskeletal: Normal range of motion.  Neurological: She is alert.  Psychiatric: Her speech is normal and behavior is normal. Judgment and thought content normal. Her mood appears anxious. Cognition and memory are normal.    Review of Systems  Psychiatric/Behavioral: The patient is nervous/anxious.   All other systems reviewed and are  negative.   Blood pressure (!) 134/59, pulse 65, temperature 98.1 F (36.7 C), temperature source Oral, resp. rate 17, SpO2 100 %.There is no height or weight on file to calculate BMI.  General Appearance: Casual  Eye Contact:  Good  Speech:  Normal Rate  Volume:  Normal  Mood:  Anxious  Affect:  Congruent  Thought Process:  Coherent and Descriptions of Associations: Intact  Orientation:  Full (Time, Place, and Person)  Thought Content:  WDL and Logical  Suicidal Thoughts:  No  Homicidal Thoughts:  No  Memory:  Immediate;   Good Recent;   Good Remote;     Good  Judgement:  Fair  Insight:  Fair  Psychomotor Activity:  Normal  Concentration:  Concentration: Good and Attention Span: Good  Recall:  Good  Fund of Knowledge:  Fair  Language:  Good  Akathisia:  No  Handed:  Right  AIMS (if indicated):     Assets:  Leisure Time Physical Health Resilience Social Support  ADL's:  Intact  Cognition:  WNL  Sleep:        Treatment Plan Summary: Schizoaffective disorder, bipolar type: -Restarted Brexpiprazole 2 mg in the am and 4 mg in the pm for psychosis -Restarted Tegretol 400 mg at bedtime for mood stabilization -Started Haldol 5 mg BID for psychosis  Disposition: No evidence of imminent risk to self or others at present.    Waylan Boga, NP 11/12/2017 10:29 AM  Patient seen face-to-face for psychiatric evaluation, chart reviewed and case discussed with the physician extender and developed treatment plan. Reviewed the information documented and agree with the treatment plan. Corena Pilgrim, MD

## 2017-11-15 ENCOUNTER — Encounter (HOSPITAL_COMMUNITY): Payer: Self-pay

## 2017-11-15 ENCOUNTER — Other Ambulatory Visit: Payer: Self-pay

## 2017-11-15 DIAGNOSIS — X30XXXA Exposure to excessive natural heat, initial encounter: Secondary | ICD-10-CM | POA: Diagnosis not present

## 2017-11-15 DIAGNOSIS — Z5321 Procedure and treatment not carried out due to patient leaving prior to being seen by health care provider: Secondary | ICD-10-CM | POA: Diagnosis not present

## 2017-11-15 DIAGNOSIS — R42 Dizziness and giddiness: Secondary | ICD-10-CM | POA: Insufficient documentation

## 2017-11-15 NOTE — ED Triage Notes (Signed)
Per EMS, patient from home, c/o lightheadedness after walking outside to get some cigarettes. Also adds that her apartment [facility] has been out. Reports sx resolved after riding in ambulance. Denies N/V/D and LOC. Hx schizophrenia.

## 2017-11-15 NOTE — ED Triage Notes (Signed)
Called pt for recheck of V/S No response

## 2017-11-16 ENCOUNTER — Emergency Department (HOSPITAL_COMMUNITY)
Admission: EM | Admit: 2017-11-16 | Discharge: 2017-11-16 | Discharge status: Against Medical Advice | Payer: Medicaid Other | Attending: Emergency Medicine | Admitting: Emergency Medicine

## 2017-11-18 NOTE — ED Notes (Signed)
Follow up call made  No answer  11/18/17  0848  s Joyice Magda rn

## 2017-12-02 ENCOUNTER — Encounter (HOSPITAL_COMMUNITY): Payer: Self-pay | Admitting: Nurse Practitioner

## 2017-12-02 ENCOUNTER — Emergency Department (HOSPITAL_COMMUNITY)
Admission: EM | Admit: 2017-12-02 | Discharge: 2017-12-03 | Disposition: A | Discharge status: Home / Self Care | Payer: Medicaid Other | Attending: Emergency Medicine | Admitting: Emergency Medicine

## 2017-12-02 DIAGNOSIS — F329 Major depressive disorder, single episode, unspecified: Secondary | ICD-10-CM | POA: Diagnosis not present

## 2017-12-02 DIAGNOSIS — Z046 Encounter for general psychiatric examination, requested by authority: Secondary | ICD-10-CM | POA: Diagnosis present

## 2017-12-02 DIAGNOSIS — R1084 Generalized abdominal pain: Secondary | ICD-10-CM | POA: Insufficient documentation

## 2017-12-02 DIAGNOSIS — G8929 Other chronic pain: Secondary | ICD-10-CM

## 2017-12-02 DIAGNOSIS — R51 Headache: Secondary | ICD-10-CM | POA: Insufficient documentation

## 2017-12-02 DIAGNOSIS — Z79899 Other long term (current) drug therapy: Secondary | ICD-10-CM | POA: Diagnosis not present

## 2017-12-02 NOTE — ED Notes (Signed)
Bed: RE32 Expected date:  Expected time:  Means of arrival:  Comments: 1 F abd pain- psych

## 2017-12-02 NOTE — ED Triage Notes (Signed)
Pt is presented from home by EMS, reportedly call was for abdominal pain but on scene pt reported she "needed to be scene for her schizophrenia" Pt is not offering any history at this time.

## 2017-12-03 ENCOUNTER — Other Ambulatory Visit: Payer: Self-pay

## 2017-12-03 MED ORDER — PROCHLORPERAZINE EDISYLATE 10 MG/2ML IJ SOLN
10.0000 mg | Freq: Once | INTRAMUSCULAR | Status: AC
  Administered 2017-12-03: 10 mg via INTRAMUSCULAR
  Filled 2017-12-03: qty 2

## 2017-12-03 MED ORDER — DIPHENHYDRAMINE HCL 50 MG/ML IJ SOLN
25.0000 mg | Freq: Once | INTRAMUSCULAR | Status: AC
  Administered 2017-12-03: 25 mg via INTRAMUSCULAR
  Filled 2017-12-03: qty 1

## 2017-12-03 NOTE — Discharge Instructions (Signed)
Follow up with your family doc and your psychiatrist.

## 2017-12-03 NOTE — ED Provider Notes (Signed)
[facility] DEPT Provider Note   CSN: [id number] Arrival date & time: 12/02/17  2329     History   Chief Complaint Chief Complaint  Patient presents with  . Abdominal Pain  . Psychiatric Evaluation    HPI [name] is a 47 y.o. female.  47 yo F with a chief complaint of abdominal pain.  This was reported to EMS.  The initial nursing evaluation the patient complained of mental illness and wanted to have that checked.  On my history the patient will not respond why she is here.  When asked multiple times if she has abdominal pain she eventually said yes.  She pointed to her right side.  She is unable to quantify this any further.  She spontaneously said that she had a headache.  She told someone that she was suicidal or so she thinks.  The history is provided by the patient.  Illness  This is a new problem. The current episode started yesterday. The problem occurs constantly. The problem has not changed since onset.Associated symptoms include abdominal pain and headaches. Pertinent negatives include no chest pain and no shortness of breath. Nothing aggravates the symptoms. Nothing relieves the symptoms. She has tried nothing for the symptoms. The treatment provided no relief.    Past Medical History:  Diagnosis Date  . Arthritis   . Bipolar 1 disorder ([place])   . Bronchitis   . Depression   . Hemorrhoid   . Schizoaffective disorder ([place])   . Schizophrenia ([place])    Pt denies and reports it is schizoaffective disorder.   . Transfusion history     Patient Active Problem List   Diagnosis Date Noted  . No diagnosis on Axis I 08/21/2015  . Abnormal behavior   . Involuntary commitment   . Schizophrenia ([place])   . Disturbance in affect   . Tobacco use disorder 05/12/2015  . Cannabis use disorder, moderate, dependence ([place]) 05/12/2015  . Schizo affective schizophrenia ([place]) 05/10/2015  . Hypertension 02/24/2011  . ANEMIA-IRON DEFICIENCY  12/13/2006  . GERD 12/13/2006  . OBESITY 12/12/2006  . Schizoaffective disorder, bipolar type ([place]) 12/12/2006  . HEMORRHOIDS, NOS 12/12/2006  . PAIN-NECK 12/12/2006  . TREMOR 12/12/2006    Past Surgical History:  Procedure Laterality Date  . CESAREAN SECTION       OB History   None      Home Medications    Prior to Admission medications   Medication Sig Start Date End Date Taking? Authorizing Provider  amantadine (SYMMETREL) 100 MG capsule Take 1 capsule (100 mg total) by mouth 2 (two) times daily. 02/03/17   McNew, Tyson Babinski, MD  Brexpiprazole (REXULTI) 4 MG TABS Take 2-4 mg by mouth See admin instructions. 2mg  QAM, 4mg  QHS    [provider]  carbamazepine (TEGRETOL XR) 400 MG 12 hr tablet Take 400 mg by mouth at bedtime.    [provider]  haloperidol decanoate (HALDOL DECANOATE) 100 MG/ML injection Inject 0.5 mLs (50 mg total) into the muscle every 28 (twenty-eight) days. 02/24/17   McNew, Tyson Babinski, MD  potassium chloride SA (K-DUR,KLOR-CON) 20 MEQ tablet Take 2 tablets (40 mEq total) by mouth 2 (two) times daily for 7 days. 10/27/17 11/03/17  Couture, Cortni S, PA-C  traZODone (DESYREL) 100 MG tablet Take 1 tablet (100 mg total) by mouth at bedtime. 02/03/17   McNew, Tyson Babinski, MD    Family History Family History  Problem Relation Age of Onset  . Diabetes type  II Other     Social History Social History   Tobacco Use  . Smoking status: Current Every Day Smoker    Packs/day: 0.50    Years: 17.00    Pack years: 8.50    Types: Cigarettes  . Smokeless tobacco: Never Used  Substance Use Topics  . Alcohol use: No  . Drug use: No    Types: Marijuana    Comment: Former user     Allergies   Latuda [lurasidone hcl]; Abilify [aripiprazole]; Flagyl [metronidazole hcl]; Prolixin [fluphenazine]; Risperidone and related; Seroquel [quetiapine fumarate]; and Sulfa antibiotics   Review of Systems Review of Systems  Constitutional: Negative for chills and  fever.  HENT: Negative for congestion and rhinorrhea.   Eyes: Negative for redness and visual disturbance.  Respiratory: Negative for shortness of breath and wheezing.   Cardiovascular: Negative for chest pain and palpitations.  Gastrointestinal: Positive for abdominal pain. Negative for nausea and vomiting.  Genitourinary: Negative for dysuria and urgency.  Musculoskeletal: Negative for arthralgias and myalgias.  Skin: Negative for pallor and wound.  Neurological: Positive for headaches. Negative for dizziness.     Physical Exam Updated Vital Signs BP (!) 153/86 (BP Location: Left Arm)   Pulse 74   Temp 99.2 F (37.3 C) (Oral)   Resp 16   Ht 5\' 4"  (1.626 m)   Wt 109.3 kg   SpO2 100%   BMI 41.36 kg/m   Physical Exam  Constitutional: She appears well-developed and well-nourished. No distress.  Obese  HENT:  Head: Normocephalic and atraumatic.  Eyes: Pupils are equal, round, and reactive to light. EOM are normal.  Neck: Normal range of motion. Neck supple.  Cardiovascular: Normal rate and regular rhythm. Exam reveals no gallop and no friction rub.  No murmur heard. Pulmonary/Chest: Effort normal. She has no wheezes. She has no rales.  Abdominal: Soft. She exhibits no distension and no mass. There is no tenderness.  Musculoskeletal: She exhibits no edema or tenderness.  Neurological: She is alert.  Slow to respond  Skin: Skin is warm and dry. She is not diaphoretic.  Psychiatric: She has a normal mood and affect. Her behavior is normal.  Nursing note and vitals reviewed.    ED Treatments / Results  Labs (all labs ordered are listed, but only abnormal results are displayed) Labs Reviewed - No data to display  EKG None  Radiology No results found.  Procedures Procedures (including critical care time)  Medications Ordered in ED Medications  prochlorperazine (COMPAZINE) injection 10 mg (has no administration in time range)  diphenhydrAMINE (BENADRYL) injection  25 mg (has no administration in time range)     Initial Impression / Assessment and Plan / ED Course  I have reviewed the triage vital signs and the nursing notes.  Pertinent labs & imaging results that were available during my care of the patient were reviewed by me and considered in my medical decision making (see chart for details).     47 yo F with a chief complaint of abdominal pain.  Patient is difficult to get a history from, she has a history of schizophrenia.  Has presented multiple times like this in the past.  At one point she states that she does have abdominal pain that does not have any on my exam.  Will obtain abdominal labs.  She also is complaining of a headache thinks that stress is making it worse feels that that is been going on for about 6 months without change.  Will give  a headache cocktail.  If the labs are unremarkable will have TTS eval.  Patient suddenly is much more lucid, answering questions with quick rapidity.  States that she is not suicidal or homicidal that she has had some abdominal pain but does not want it evaluated that she just would like to go home.  I discussed my plan of giving her medicine for her headache is been going on for about a year, she states she would like the medicine would not like labs done and will follow-up with her family physician.  2:22 AM:  I have discussed the diagnosis/risks/treatment options with the patient and believe the pt to be eligible for discharge home to follow-up with PCP. We also discussed returning to the ED immediately if new or worsening sx occur. We discussed the sx which are most concerning (e.g., sudden worsening pain, fever, inability to tolerate by mouth) that necessitate immediate return. Medications administered to the patient during their visit and any new prescriptions provided to the patient are listed below.  Medications given during this visit Medications  prochlorperazine (COMPAZINE) injection 10 mg (has no  administration in time range)  diphenhydrAMINE (BENADRYL) injection 25 mg (has no administration in time range)      The patient appears reasonably screen and/or stabilized for discharge and I doubt any other medical condition or other [facility] requiring further screening, evaluation, or treatment in the ED at this time prior to discharge.    Final Clinical Impressions(s) / ED Diagnoses   Final diagnoses:  Chronic intractable headache, unspecified headache type  Generalized abdominal pain    ED Discharge Orders    None       Deno Etienne, DO 12/03/17 0222

## 2017-12-03 NOTE — ED Notes (Signed)
Entered patients room to give Compazine and Benadryl. Noticed patient had removed her IV, and was bleeding form the site. I bandaged, and wrapped the site. Before administering the medications patient stated she felt that she did not need to be in the hospital any more and was ready to leave. I reported this to Dr. Tyrone Nine, and he spoke with the patient. After his discussion Dr. Tyrone Nine informed me the patient would like the medications, and would then leave.   Tillman Abide RN

## 2017-12-14 ENCOUNTER — Emergency Department (HOSPITAL_COMMUNITY)
Admission: EM | Admit: 2017-12-14 | Discharge: 2017-12-14 | Disposition: A | Discharge status: Home / Self Care | Payer: Medicaid Other | Attending: Emergency Medicine | Admitting: Emergency Medicine

## 2017-12-14 ENCOUNTER — Encounter (HOSPITAL_COMMUNITY): Payer: Self-pay | Admitting: Emergency Medicine

## 2017-12-14 DIAGNOSIS — Z5321 Procedure and treatment not carried out due to patient leaving prior to being seen by health care provider: Secondary | ICD-10-CM | POA: Insufficient documentation

## 2017-12-14 DIAGNOSIS — N898 Other specified noninflammatory disorders of vagina: Secondary | ICD-10-CM | POA: Insufficient documentation

## 2017-12-14 NOTE — ED Triage Notes (Signed)
Per [facility] pt feom home for having clump of grayish discharge yesterday. Pt informed EMS that she felt like she was going to pass out when was told she was going to triage vs a treatment room.  Pt unsure if pregnant. Pt reports feeling like something in her lower abd.

## 2017-12-16 NOTE — ED Notes (Signed)
Follow up call made  No answer  12/16/17  2524  s Ahri Olson rn

## 2018-01-27 ENCOUNTER — Encounter (HOSPITAL_COMMUNITY): Payer: Self-pay

## 2018-01-27 ENCOUNTER — Other Ambulatory Visit: Payer: Self-pay

## 2018-01-27 ENCOUNTER — Emergency Department (HOSPITAL_COMMUNITY)
Admission: EM | Admit: 2018-01-27 | Discharge: 2018-01-27 | Disposition: A | Discharge status: Home / Self Care | Payer: Medicaid Other | Attending: Emergency Medicine | Admitting: Emergency Medicine

## 2018-01-27 DIAGNOSIS — B9689 Other specified bacterial agents as the cause of diseases classified elsewhere: Secondary | ICD-10-CM

## 2018-01-27 DIAGNOSIS — F17210 Nicotine dependence, cigarettes, uncomplicated: Secondary | ICD-10-CM | POA: Diagnosis not present

## 2018-01-27 DIAGNOSIS — F319 Bipolar disorder, unspecified: Secondary | ICD-10-CM | POA: Insufficient documentation

## 2018-01-27 DIAGNOSIS — N760 Acute vaginitis: Secondary | ICD-10-CM | POA: Insufficient documentation

## 2018-01-27 DIAGNOSIS — Z79899 Other long term (current) drug therapy: Secondary | ICD-10-CM | POA: Diagnosis not present

## 2018-01-27 DIAGNOSIS — F209 Schizophrenia, unspecified: Secondary | ICD-10-CM | POA: Insufficient documentation

## 2018-01-27 DIAGNOSIS — N898 Other specified noninflammatory disorders of vagina: Secondary | ICD-10-CM | POA: Diagnosis present

## 2018-01-27 LAB — URINALYSIS, ROUTINE W REFLEX MICROSCOPIC
Bilirubin Urine: NEGATIVE
GLUCOSE, UA: NEGATIVE mg/dL
Ketones, ur: NEGATIVE mg/dL
Leukocytes, UA: NEGATIVE
Nitrite: NEGATIVE
PH: 5 (ref 5.0–8.0)
Protein, ur: NEGATIVE mg/dL
SPECIFIC GRAVITY, URINE: 1.011 (ref 1.005–1.030)

## 2018-01-27 LAB — GC/CHLAMYDIA PROBE AMP (~~LOC~~) NOT AT ARMC
CHLAMYDIA, DNA PROBE: NEGATIVE
Neisseria Gonorrhea: NEGATIVE

## 2018-01-27 LAB — WET PREP, GENITAL
SPERM: NONE SEEN
TRICH WET PREP: NONE SEEN
Yeast Wet Prep HPF POC: NONE SEEN

## 2018-01-27 LAB — PREGNANCY, URINE: PREG TEST UR: NEGATIVE

## 2018-01-27 MED ORDER — METRONIDAZOLE 0.75 % VA GEL: 1.0000 | Freq: Two times a day (BID) | VAGINAL | 0 refills | Status: DC

## 2018-01-27 NOTE — ED Notes (Signed)
Bed: QP61 Expected date:  Expected time:  Means of arrival:  Comments: Room 11- 77F foreign body in vagina

## 2018-01-27 NOTE — ED Triage Notes (Signed)
Arrived via [facility] with c/o of possible condom in vagina from 2 days ago.

## 2018-01-27 NOTE — ED Provider Notes (Signed)
[facility] DEPT Provider Note: Georgena Spurling, MD, FACEP  CSN: [id number] MRN: [id number] ARRIVAL: 01/27/18 at [place]: [place]  Foreign Object in [place]  01/27/18 2:52 AM Orpah Greek Cleta Heatley is a 47 y.o. female who was brought in by EMS because she thinks she may have a condom in her vagina for the past 2 days.  She is also having a white vaginal discharge as well as some discomfort that is keeping her from sleeping.   Past Medical History:  Diagnosis Date  . Arthritis   . Bipolar 1 disorder ([place])   . Bronchitis   . Depression   . Hemorrhoid   . Schizoaffective disorder ([place])   . Schizophrenia ([place])    Pt denies and reports it is schizoaffective disorder.   . Transfusion history     Past Surgical History:  Procedure Laterality Date  . CESAREAN SECTION      Family History  Problem Relation Age of Onset  . Diabetes type II Other     Social History   Tobacco Use  . Smoking status: Current Every Day Smoker    Packs/day: 0.50    Years: 17.00    Pack years: 8.50    Types: Cigarettes  . Smokeless tobacco: Never Used  Substance Use Topics  . Alcohol use: No  . Drug use: No    Types: Marijuana    Comment: Former user    Prior to Admission medications   Medication Sig Start Date End Date Taking? Authorizing Provider  amantadine (SYMMETREL) 100 MG capsule Take 1 capsule (100 mg total) by mouth 2 (two) times daily. 02/03/17   McNew, Tyson Babinski, MD  Brexpiprazole (REXULTI) 4 MG TABS Take 2-4 mg by mouth See admin instructions. 2mg  QAM, 4mg  QHS    [provider]  carbamazepine (TEGRETOL XR) 400 MG 12 hr tablet Take 400 mg by mouth at bedtime.    [provider]  haloperidol decanoate (HALDOL DECANOATE) 100 MG/ML injection Inject 0.5 mLs (50 mg total) into the muscle every 28 (twenty-eight) days. 02/24/17   McNew, Tyson Babinski, MD  potassium chloride SA (K-DUR,KLOR-CON) 20 MEQ tablet Take 2 tablets (40  mEq total) by mouth 2 (two) times daily for 7 days. 10/27/17 11/03/17  Couture, Cortni S, PA-C  traZODone (DESYREL) 100 MG tablet Take 1 tablet (100 mg total) by mouth at bedtime. 02/03/17   McNew, Tyson Babinski, MD    Allergies Anette Guarneri [lurasidone hcl]; Abilify [aripiprazole]; Flagyl [metronidazole hcl]; Prolixin [fluphenazine]; Risperidone and related; Seroquel [quetiapine fumarate]; and Sulfa antibiotics   REVIEW OF SYSTEMS  Negative except as noted here or in the History of Present Illness.   PHYSICAL EXAMINATION  Initial Vital Signs Blood pressure 128/77, pulse 79, temperature 98.6 F (37 C), temperature source Oral, resp. rate 15, height 5\' 4"  (1.626 m), weight 104.3 kg, SpO2 98 %.  Examination General: Well-developed, well-nourished female in no acute distress; appearance consistent with age of record HENT: normocephalic; atraumatic Eyes: Normal appearance Neck: supple Heart: regular rate and rhythm Lungs: clear to auscultation bilaterally Abdomen: soft; nondistended; mild suprapubic tenderness; bowel sounds present GU: Normal external genitalia; white vaginal discharge; no foreign body seen or palpated; no cervical motion tenderness; no vaginal bleeding Extremities: No deformity; full range of motion Neurologic: Awake, alert and oriented; motor function intact in all extremities and symmetric; no facial droop Skin: Warm and dry Psychiatric: Normal mood and affect   RESULTS  Summary of this  visit's results, reviewed by myself:   EKG Interpretation  Date/Time:    Ventricular Rate:    PR Interval:    QRS Duration:   QT Interval:    QTC Calculation:   R Axis:     Text Interpretation:        Laboratory Studies: Results for orders placed or performed during the hospital encounter of 01/27/18 (from the past 24 hour(s))  Wet prep, genital     Status: Abnormal   Collection Time: 01/27/18  3:04 AM  Result Value Ref Range   Yeast Wet Prep HPF POC NONE SEEN NONE SEEN   Trich,  Wet Prep NONE SEEN NONE SEEN   Clue Cells Wet Prep HPF POC PRESENT (A) NONE SEEN   WBC, Wet Prep HPF POC FEW (A) NONE SEEN   Sperm NONE SEEN   Pregnancy, urine     Status: None   Collection Time: 01/27/18  3:04 AM  Result Value Ref Range   Preg Test, Ur NEGATIVE NEGATIVE  Urinalysis, Routine w reflex microscopic     Status: Abnormal   Collection Time: 01/27/18  3:04 AM  Result Value Ref Range   Color, Urine YELLOW YELLOW   APPearance CLEAR CLEAR   Specific Gravity, Urine 1.011 1.005 - 1.030   pH 5.0 5.0 - 8.0   Glucose, UA NEGATIVE NEGATIVE mg/dL   Hgb urine dipstick SMALL (A) NEGATIVE   Bilirubin Urine NEGATIVE NEGATIVE   Ketones, ur NEGATIVE NEGATIVE mg/dL   Protein, ur NEGATIVE NEGATIVE mg/dL   Nitrite NEGATIVE NEGATIVE   Leukocytes, UA NEGATIVE NEGATIVE   RBC / HPF 0-5 0 - 5 RBC/hpf   WBC, UA 0-5 0 - 5 WBC/hpf   Bacteria, UA RARE (A) NONE SEEN   Squamous Epithelial / LPF 6-10 0 - 5   Mucus PRESENT    Imaging Studies: No results found.  ED COURSE and MDM  Nursing notes and initial vitals signs, including pulse oximetry, reviewed.  Vitals:   01/27/18 0107 01/27/18 0135  BP:  128/77  Pulse:  79  Resp:  15  Temp:  98.6 F (37 C)  TempSrc:  Oral  SpO2:  98%  Weight: 104.3 kg 104.3 kg  Height: 5\' 4"  (1.626 m) 5\' 4"  (1.626 m)    PROCEDURES    ED DIAGNOSES     ICD-10-CM   1. BV (bacterial vaginosis) N76.0    B96.89        Braylyn Kalter, MD 01/27/18 [phone]

## 2018-05-07 ENCOUNTER — Emergency Department (HOSPITAL_COMMUNITY): Admit: 2018-05-07 | Discharge: 2018-05-07 | Discharge status: Absent without leave | Payer: Medicaid Other

## 2018-06-26 ENCOUNTER — Emergency Department (HOSPITAL_COMMUNITY): Payer: Medicaid Other

## 2018-06-26 ENCOUNTER — Encounter (HOSPITAL_COMMUNITY): Payer: Self-pay

## 2018-06-26 ENCOUNTER — Emergency Department (HOSPITAL_COMMUNITY)
Admission: EM | Admit: 2018-06-26 | Discharge: 2018-06-26 | Disposition: A | Discharge status: Home / Self Care | Payer: Medicaid Other | Attending: Emergency Medicine | Admitting: Emergency Medicine

## 2018-06-26 ENCOUNTER — Other Ambulatory Visit: Payer: Self-pay

## 2018-06-26 DIAGNOSIS — Z5321 Procedure and treatment not carried out due to patient leaving prior to being seen by health care provider: Secondary | ICD-10-CM | POA: Insufficient documentation

## 2018-06-26 DIAGNOSIS — R079 Chest pain, unspecified: Secondary | ICD-10-CM | POA: Diagnosis present

## 2018-06-26 LAB — BASIC METABOLIC PANEL
Anion gap: 9 (ref 5–15)
BUN: 7 mg/dL (ref 6–20)
CHLORIDE: 107 mmol/L (ref 98–111)
CO2: 23 mmol/L (ref 22–32)
Calcium: 8.5 mg/dL — ABNORMAL LOW (ref 8.9–10.3)
Creatinine, Ser: 0.79 mg/dL (ref 0.44–1.00)
GFR calc Af Amer: >60 mL/min (ref >60)
GFR calc non Af Amer: >60 mL/min (ref >60)
Glucose, Bld: 95 mg/dL (ref 70–99)
Potassium: 3.3 mmol/L — ABNORMAL LOW (ref 3.5–5.1)
Sodium: 139 mmol/L (ref 135–145)

## 2018-06-26 LAB — I-STAT TROPONIN, ED: Troponin i, poc: 0.01 ng/mL (ref 0.00–0.08)

## 2018-06-26 LAB — CBC
HEMATOCRIT: 27.8 % — AB (ref 36.0–46.0)
Hemoglobin: 8.1 g/dL — ABNORMAL LOW (ref 12.0–15.0)
MCH: 24.1 pg — ABNORMAL LOW (ref 26.0–34.0)
MCHC: 29.1 g/dL — ABNORMAL LOW (ref 30.0–36.0)
MCV: 82.7 fL (ref 80.0–100.0)
Platelets: 321 10*3/uL (ref 150–400)
RBC: 3.36 MIL/uL — AB (ref 3.87–5.11)
RDW: 22.7 % — ABNORMAL HIGH (ref 11.5–15.5)
WBC: 8.3 10*3/uL (ref 4.0–10.5)
nRBC: 0 % (ref 0.0–0.2)

## 2018-06-26 LAB — I-STAT BETA HCG BLOOD, ED (MC, WL, AP ONLY): I-stat hCG, quantitative: <5 m[IU]/mL (ref <5)

## 2018-06-26 NOTE — ED Triage Notes (Signed)
Pt BIB [facility] for eval of central chest pain w/ radiation "to my R shoulder, around to my back and then all the way down my legs". Pt denies SOB, denies known injury/trauma. NARD/NAD. Worse w/ palpation/exertion

## 2018-06-26 NOTE — ED Notes (Signed)
Pt requesting to have iv removed. Wants to leave. Encouraged to see provider. Does not want to wait.

## 2018-06-27 ENCOUNTER — Telehealth: Payer: Self-pay | Admitting: *Deleted

## 2018-06-27 NOTE — Telephone Encounter (Signed)
ED CM received a call from the patient. She states she was seen in the ED yesterday but could not stay to receive discharge instructions because she needed to catch the last bus. She asked if any prescriptions were written for her yesterday. She states she feels like her arm has been "torn off." CM reviewed the chart and informed the patient there were no prescriptions written. She asked what she needs to do. CM instructed the patient to call her PCP or return to the ED if she does not feel well. She asked if CM can tell her if anything was found yesterday while she was here. CM states she cannot provide results. Venita Sheffield RN CCM

## 2018-07-05 ENCOUNTER — Encounter (HOSPITAL_COMMUNITY): Payer: Self-pay | Admitting: *Deleted

## 2018-07-05 ENCOUNTER — Emergency Department (HOSPITAL_COMMUNITY): Payer: Medicaid Other

## 2018-07-05 ENCOUNTER — Emergency Department (HOSPITAL_COMMUNITY)
Admission: EM | Admit: 2018-07-05 | Discharge: 2018-07-05 | Disposition: A | Discharge status: Home / Self Care | Payer: Medicaid Other | Attending: Emergency Medicine | Admitting: Emergency Medicine

## 2018-07-05 ENCOUNTER — Other Ambulatory Visit: Payer: Self-pay

## 2018-07-05 DIAGNOSIS — M79601 Pain in right arm: Secondary | ICD-10-CM | POA: Diagnosis not present

## 2018-07-05 DIAGNOSIS — Z79899 Other long term (current) drug therapy: Secondary | ICD-10-CM | POA: Diagnosis not present

## 2018-07-05 DIAGNOSIS — F17210 Nicotine dependence, cigarettes, uncomplicated: Secondary | ICD-10-CM | POA: Diagnosis not present

## 2018-07-05 DIAGNOSIS — J029 Acute pharyngitis, unspecified: Secondary | ICD-10-CM | POA: Insufficient documentation

## 2018-07-05 DIAGNOSIS — R0789 Other chest pain: Secondary | ICD-10-CM | POA: Diagnosis not present

## 2018-07-05 DIAGNOSIS — I10 Essential (primary) hypertension: Secondary | ICD-10-CM | POA: Insufficient documentation

## 2018-07-05 LAB — COMPREHENSIVE METABOLIC PANEL
ALT: 10 U/L (ref 0–44)
AST: 16 U/L (ref 15–41)
Albumin: 3.6 g/dL (ref 3.5–5.0)
Alkaline Phosphatase: 88 U/L (ref 38–126)
Anion gap: 10 (ref 5–15)
BUN: 11 mg/dL (ref 6–20)
CO2: 21 mmol/L — ABNORMAL LOW (ref 22–32)
CREATININE: 1 mg/dL (ref 0.44–1.00)
Calcium: 8.8 mg/dL — ABNORMAL LOW (ref 8.9–10.3)
Chloride: 108 mmol/L (ref 98–111)
GFR calc Af Amer: >60 mL/min (ref >60)
GFR calc non Af Amer: >60 mL/min (ref >60)
Glucose, Bld: 93 mg/dL (ref 70–99)
Potassium: 3.3 mmol/L — ABNORMAL LOW (ref 3.5–5.1)
Sodium: 139 mmol/L (ref 135–145)
Total Bilirubin: 0.4 mg/dL (ref 0.3–1.2)
Total Protein: 7.4 g/dL (ref 6.5–8.1)

## 2018-07-05 LAB — CBC WITH DIFFERENTIAL/PLATELET
Abs Immature Granulocytes: 0.02 10*3/uL (ref 0.00–0.07)
Basophils Absolute: 0 10*3/uL (ref 0.0–0.1)
Basophils Relative: 1 %
EOS PCT: 1 %
Eosinophils Absolute: 0.1 10*3/uL (ref 0.0–0.5)
HCT: 29.8 % — ABNORMAL LOW (ref 36.0–46.0)
HEMOGLOBIN: 9 g/dL — AB (ref 12.0–15.0)
Immature Granulocytes: 0 %
LYMPHS PCT: 35 %
Lymphs Abs: 2.4 10*3/uL (ref 0.7–4.0)
MCH: 25.1 pg — ABNORMAL LOW (ref 26.0–34.0)
MCHC: 30.2 g/dL (ref 30.0–36.0)
MCV: 83.2 fL (ref 80.0–100.0)
Monocytes Absolute: 0.3 10*3/uL (ref 0.1–1.0)
Monocytes Relative: 5 %
Neutro Abs: 4 10*3/uL (ref 1.7–7.7)
Neutrophils Relative %: 58 %
Platelets: 227 10*3/uL (ref 150–400)
RBC: 3.58 MIL/uL — ABNORMAL LOW (ref 3.87–5.11)
RDW: 23 % — ABNORMAL HIGH (ref 11.5–15.5)
WBC: 6.8 10*3/uL (ref 4.0–10.5)
nRBC: 0 % (ref 0.0–0.2)

## 2018-07-05 LAB — TROPONIN I: Troponin I: <0.03 ng/mL (ref <0.03)

## 2018-07-05 NOTE — Discharge Instructions (Addendum)
As discussed, your evaluation today has been largely reassuring.  But, it is important that you monitor your condition carefully, and do not hesitate to return to the ED if you develop new, or concerning changes in your condition.  Otherwise, please follow-up with your physician for appropriate ongoing care.  Also, please recall that you have 1 outstanding laboratory study that is being conducted. You should be made aware of abnormal findings, but it is important you check your MyChart account to ensure that you know the results.

## 2018-07-05 NOTE — ED Triage Notes (Signed)
Pt in c/o mid radiating CP to R arm pit onset x 7 days ago, intermittent, pt seen last week for similar symptoms and did not stay to see MD per pt, pt denies SOB, n/v/d, A&O x4

## 2018-07-05 NOTE — ED Provider Notes (Signed)
[facility] EMERGENCY DEPARTMENT Provider Note   CSN: [id number] Arrival date & time: 07/05/18  1259    History   Chief Complaint Chief Complaint  Patient presents with  . Chest Pain    HPI [name] is a 48 y.o. female.     HPI Patient presents with concern of right-sided chest pain, right arm pain, and sore throat. Patient has multiple medical issues including depression, schizoaffective disorder. She notes that her psychiatric symptoms are well controlled, with medication, which she has been taking regularly. She presents today due to the aforementioned concerns. Onset was about 1 week ago, since onset pain is been persistent, though intermittent in terms of severity, right-sided, nonradiating, sore. No pleurisy, no exertional pain. No clear positional factors either. No syncope, no Patricia dyspnea.  Patient is a cigarette smoker. Smoking cessation provided, particularly in light of this patient's evaluation in the ED.   Patient also has some concern about possible gonococcal pharyngitis She notes that she has had recurrent bacterial vaginosis for some time, has concern over inconclusive gonorrhea chlamydia sample recently performed, but not available on our records. She denies other Patricia complaints including belly pain, vomiting, diarrhea, fever. She requests throat swab evaluation for gonorrhea. Past Medical History:  Diagnosis Date  . Arthritis   . Bipolar 1 disorder ([name])   . Bronchitis   . Depression   . Hemorrhoid   . Schizoaffective disorder ([name])   . Schizophrenia ([name])    Pt denies and reports it is schizoaffective disorder.   . Transfusion history     Patient Active Problem List   Diagnosis Date Noted  . No diagnosis on Axis I 08/21/2015  . Abnormal behavior   . Involuntary commitment   . Schizophrenia ([name])   . Disturbance in affect   . Tobacco use disorder 05/12/2015  . Cannabis use disorder, moderate, dependence ([name])  05/12/2015  . Schizo affective schizophrenia ([name]) 05/10/2015  . Hypertension 02/24/2011  . ANEMIA-IRON DEFICIENCY 12/13/2006  . GERD 12/13/2006  . OBESITY 12/12/2006  . Schizoaffective disorder, bipolar type ([name]) 12/12/2006  . HEMORRHOIDS, NOS 12/12/2006  . PAIN-NECK 12/12/2006  . TREMOR 12/12/2006    Past Surgical History:  Procedure Laterality Date  . CESAREAN SECTION       OB History   No obstetric history on file.      Home Medications    Prior to Admission medications   Medication Sig Start Date End Date Taking? Authorizing Provider  amantadine (SYMMETREL) 100 MG capsule Take 1 capsule (100 mg total) by mouth 2 (two) times daily. 02/03/17   McNew, Tyson Babinski, MD  Brexpiprazole (REXULTI) 4 MG TABS Take 2-4 mg by mouth See admin instructions. 2mg  QAM, 4mg  QHS    [provider]  carbamazepine (TEGRETOL XR) 400 MG 12 hr tablet Take 400 mg by mouth at bedtime.    [provider]  haloperidol decanoate (HALDOL DECANOATE) 100 MG/ML injection Inject 0.5 mLs (50 mg total) into the muscle every 28 (twenty-eight) days. 02/24/17   McNew, Tyson Babinski, MD  metroNIDAZOLE (METROGEL VAGINAL) 0.75 % vaginal gel Place 1 Applicatorful vaginally 2 (two) times daily. 01/27/18   Molpus, John, MD  potassium chloride SA (K-DUR,KLOR-CON) 20 MEQ tablet Take 2 tablets (40 mEq total) by mouth 2 (two) times daily for 7 days. 10/27/17 11/03/17  Couture, Cortni S, PA-C  traZODone (DESYREL) 100 MG tablet Take 1 tablet (100 mg total) by mouth at bedtime. 02/03/17   McNew, Tyson Babinski, MD  Family History Family History  Problem Relation Age of Onset  . Diabetes type II Other     Social History Social History   Tobacco Use  . Smoking status: Current Every Day Smoker    Packs/day: 0.50    Years: 17.00    Pack years: 8.50    Types: Cigarettes  . Smokeless tobacco: Never Used  Substance Use Topics  . Alcohol use: No  . Drug use: No    Types: Marijuana    Comment: Former user      Allergies   Latuda [lurasidone hcl]; Abilify [aripiprazole]; Flagyl [metronidazole hcl]; Prolixin [fluphenazine]; Risperidone and related; Seroquel [quetiapine fumarate]; and Sulfa antibiotics   Review of Systems Review of Systems  Constitutional:       Per HPI, otherwise negative  HENT:       Per HPI, otherwise negative  Respiratory:       Per HPI, otherwise negative  Cardiovascular:       Per HPI, otherwise negative  Gastrointestinal: Negative for vomiting.  Endocrine:       Negative aside from HPI  Genitourinary:       Neg aside from HPI   Musculoskeletal:       Per HPI, otherwise negative  Skin: Negative.   Neurological: Negative for syncope.     Physical Exam Updated Vital Signs BP (!) 150/77   Pulse 70   Temp 98.5 F (36.9 C) (Oral)   Resp 12   Ht 5\' 4"  (1.626 m)   Wt 106 kg   LMP 06/15/2018   SpO2 97%   BMI 40.11 kg/m   Physical Exam Vitals signs and nursing note reviewed.  Constitutional:      General: She is not in acute distress.    Appearance: She is well-developed.  HENT:     Head: Normocephalic and atraumatic.     Comments: Oropharynx unremarkable Eyes:     Conjunctiva/sclera: Conjunctivae normal.  Cardiovascular:     Rate and Rhythm: Normal rate and regular rhythm.  Pulmonary:     Effort: Pulmonary effort is normal. No respiratory distress.     Breath sounds: Normal breath sounds. No stridor.  Abdominal:     General: There is no distension.  Skin:    General: Skin is warm and dry.  Neurological:     Mental Status: She is alert and oriented to person, place, and time.     Cranial Nerves: No cranial nerve deficit.      ED Treatments / Results  Labs (all labs ordered are listed, but only abnormal results are displayed) Labs Reviewed  COMPREHENSIVE METABOLIC PANEL - Abnormal; Notable for the following components:      Result Value   Potassium 3.3 (*)    CO2 21 (*)    Calcium 8.8 (*)    All other components within normal limits   CBC WITH DIFFERENTIAL/PLATELET - Abnormal; Notable for the following components:   RBC 3.58 (*)    Hemoglobin 9.0 (*)    HCT 29.8 (*)    MCH 25.1 (*)    RDW 23.0 (*)    All other components within normal limits  TROPONIN I  GC/CHLAMYDIA PROBE AMP ([place]) NOT AT [facility]    EKG EKG Interpretation  Date/Time:  Wednesday July 05 2018 13:06:59 EDT Ventricular Rate:  72 PR Interval:    QRS Duration: 97 QT Interval:  385 QTC Calculation: 422 R Axis:   77 Text Interpretation:  Sinus rhythm Borderline low voltage, extremity leads Baseline  wander Abnormal ekg Confirmed by Carmin Muskrat [phone]) on 07/05/2018 1:11:25 PM   Radiology Dg Chest 2 View  Result Date: 07/05/2018 CLINICAL DATA:  Right chest pain for 1 week EXAM: CHEST - 2 VIEW COMPARISON:  06/26/2018 FINDINGS: There is no focal parenchymal opacity. There is no pleural effusion or pneumothorax. There is stable cardiomegaly. The osseous structures are unremarkable. IMPRESSION: No active cardiopulmonary disease. Electronically Signed   By: Kathreen Devoid   On: 07/05/2018 14:30    Procedures Procedures (including critical care time)  Medications Ordered in ED Medications - No data to display   Initial Impression / Assessment and Plan / ED Course  I have reviewed the triage vital signs and the nursing notes.  Pertinent labs & imaging results that were available during my care of the patient were reviewed by me and considered in my medical decision making (see chart for details).        3:44 PM Repeat exam the patient is awake, alert, in no distress, sitting upright, watching television. Labs reassuring, EKG reassuring, no evidence for arrhythmia, ischemia. Labs do evidence for mild anemia, but no critical value. Patient is hemodynamically unremarkable. We discussed all findings, and with reassuring results, she will follow-up as an outpatient. On the discharge GC swab results pending.  Final Clinical Impressions(s) /  ED Diagnoses   Final diagnoses:  Atypical chest pain      Carmin Muskrat, MD 07/05/18 1545

## 2018-07-05 NOTE — ED Notes (Signed)
Patient transported to X-ray 

## 2018-07-06 LAB — GC/CHLAMYDIA PROBE AMP (~~LOC~~) NOT AT ARMC
Chlamydia: NEGATIVE
Neisseria Gonorrhea: NEGATIVE

## 2018-10-29 ENCOUNTER — Encounter (HOSPITAL_COMMUNITY): Payer: Self-pay

## 2018-10-29 ENCOUNTER — Other Ambulatory Visit: Payer: Self-pay

## 2018-10-29 ENCOUNTER — Emergency Department (HOSPITAL_COMMUNITY)
Admission: EM | Admit: 2018-10-29 | Discharge: 2018-10-29 | Discharge status: Against Medical Advice | Payer: Medicaid Other | Attending: Emergency Medicine | Admitting: Emergency Medicine

## 2018-10-29 DIAGNOSIS — Z5321 Procedure and treatment not carried out due to patient leaving prior to being seen by health care provider: Secondary | ICD-10-CM | POA: Insufficient documentation

## 2018-10-29 DIAGNOSIS — F29 Unspecified psychosis not due to a substance or known physiological condition: Secondary | ICD-10-CM | POA: Diagnosis present

## 2018-10-29 NOTE — ED Notes (Signed)
Police came back in with patient and state, "She just wanted to come to the hospital."

## 2018-10-29 NOTE — ED Notes (Signed)
Pt states that she does not want blood work. She states that she is in the wrong place and is leaving. She ambulated out of the ED calmly and in no distress.

## 2018-10-29 NOTE — ED Notes (Signed)
Called for triage x1. No answer, not in lobby.

## 2018-10-29 NOTE — ED Triage Notes (Signed)
Pt left in lobby by police. She states, "some stuff happened at the wring time." She denies SI and HI to this RN. Pt answers are delayed. Calm and cooperative at this time.

## 2018-11-08 ENCOUNTER — Other Ambulatory Visit: Payer: Self-pay | Admitting: Behavioral Health

## 2018-11-08 ENCOUNTER — Encounter (HOSPITAL_COMMUNITY): Payer: Self-pay

## 2018-11-08 ENCOUNTER — Inpatient Hospital Stay (HOSPITAL_COMMUNITY)
Admission: RE | Admit: 2018-11-08 | Discharge: 2018-11-17 | DRG: 885 | Disposition: A | Discharge status: Home / Self Care | Payer: Medicaid Other | Attending: Psychiatry | Admitting: Psychiatry

## 2018-11-08 ENCOUNTER — Other Ambulatory Visit: Payer: Self-pay

## 2018-11-08 DIAGNOSIS — F17210 Nicotine dependence, cigarettes, uncomplicated: Secondary | ICD-10-CM | POA: Diagnosis present

## 2018-11-08 DIAGNOSIS — F250 Schizoaffective disorder, bipolar type: Secondary | ICD-10-CM | POA: Diagnosis present

## 2018-11-08 DIAGNOSIS — Z20828 Contact with and (suspected) exposure to other viral communicable diseases: Secondary | ICD-10-CM | POA: Diagnosis present

## 2018-11-08 DIAGNOSIS — F1210 Cannabis abuse, uncomplicated: Secondary | ICD-10-CM | POA: Diagnosis present

## 2018-11-08 DIAGNOSIS — G4700 Insomnia, unspecified: Secondary | ICD-10-CM | POA: Diagnosis present

## 2018-11-08 DIAGNOSIS — Z9114 Patient's other noncompliance with medication regimen: Secondary | ICD-10-CM | POA: Diagnosis not present

## 2018-11-08 DIAGNOSIS — R443 Hallucinations, unspecified: Secondary | ICD-10-CM | POA: Diagnosis not present

## 2018-11-08 DIAGNOSIS — F259 Schizoaffective disorder, unspecified: Secondary | ICD-10-CM | POA: Diagnosis present

## 2018-11-08 LAB — SARS CORONAVIRUS 2 BY RT PCR (HOSPITAL ORDER, PERFORMED IN ~~LOC~~ HOSPITAL LAB): SARS Coronavirus 2: NEGATIVE

## 2018-11-08 MED ORDER — CLONAZEPAM 1 MG PO TABS
1.0000 mg | ORAL_TABLET | Freq: Three times a day (TID) | ORAL | Status: DC
  Administered 2018-11-09: 08:00:00 1 mg via ORAL
  Filled 2018-11-08: qty 1

## 2018-11-08 MED ORDER — LORAZEPAM 1 MG PO TABS
2.0000 mg | ORAL_TABLET | Freq: Four times a day (QID) | ORAL | Status: DC | PRN
  Administered 2018-11-10 – 2018-11-16 (×3): 2 mg via ORAL
  Filled 2018-11-08 (×3): qty 2

## 2018-11-08 MED ORDER — BREXPIPRAZOLE 2 MG PO TABS
2.0000 mg | ORAL_TABLET | Freq: Two times a day (BID) | ORAL | Status: DC
  Administered 2018-11-09 – 2018-11-13 (×9): 2 mg via ORAL
  Filled 2018-11-08 (×11): qty 1

## 2018-11-08 MED ORDER — TEMAZEPAM 30 MG PO CAPS
30.0000 mg | ORAL_CAPSULE | Freq: Every day | ORAL | Status: DC
  Administered 2018-11-09 – 2018-11-10 (×2): 30 mg via ORAL
  Filled 2018-11-08 (×2): qty 1

## 2018-11-08 MED ORDER — MAGNESIUM HYDROXIDE 400 MG/5ML PO SUSP: 30.0000 mL | Freq: Every day | ORAL | Status: DC | PRN

## 2018-11-08 MED ORDER — NON FORMULARY: 2.0000 mg | Freq: Two times a day (BID) | Status: DC

## 2018-11-08 MED ORDER — CARBAMAZEPINE 100 MG PO CHEW
100.0000 mg | CHEWABLE_TABLET | Freq: Three times a day (TID) | ORAL | Status: DC
  Administered 2018-11-08 – 2018-11-09 (×2): 100 mg via ORAL
  Filled 2018-11-08 (×6): qty 1

## 2018-11-08 MED ORDER — ALUM & MAG HYDROXIDE-SIMETH 200-200-20 MG/5ML PO SUSP: 30.0000 mL | ORAL | Status: DC | PRN

## 2018-11-08 MED ORDER — LORAZEPAM 2 MG/ML IJ SOLN: 2.0000 mg | Freq: Four times a day (QID) | INTRAMUSCULAR | Status: DC | PRN

## 2018-11-08 NOTE — Tx Team (Signed)
Initial Treatment Plan 11/08/2018 6:34 PM Orpah Greek Mora Pedraza KGS:[id number]    PATIENT STRESSORS: Medication change or noncompliance   PATIENT STRENGTHS: Ability for insight Capable of independent living General fund of knowledge   PATIENT IDENTIFIED PROBLEMS: "My symptoms have increased"  "I stopped taking my medications"                   DISCHARGE CRITERIA:  Verbal commitment to aftercare and medication compliance  PRELIMINARY DISCHARGE PLAN: Return to previous living arrangement  PATIENT/FAMILY INVOLVEMENT: This treatment plan has been presented to and reviewed with the patient, [name], and/or family member,   The patient and family have been given the opportunity to ask questions and make suggestions.  Clarita Crane, RN 11/08/2018, 6:34 PM

## 2018-11-08 NOTE — BH Assessment (Signed)
Assessment Note  [name] is an 48 y.o. female  who presents as a voluntary walk-in to [facility] accompanied alone although transported by EMS. Patient reports she is currently experiencing auditory and visual hallucinations as well as paranoia.  She does not decribe any command hallucinations. She reports seeing, " demons." She states, " its the enemy." She is very disorganized and has significant thought blocking. She reports feeling paranoid and that, " people are out to get me."She is a patient of Dr. [name] (ACT team) who also spoke with patients. Per Dr. [name] her presentation is very different from her presentation in the past.  She has of schizoaffective disorder, bipolar type. She states she has not had her medications in the past three days. She denies SI or HI. She states she is not using any substance.  At this time, we are recommending inpatient psychiatric hospitalization for stabilization and safety.   Diagnosis:  Schizophrenia   Past Medical History:  Past Medical History:  Diagnosis Date  . Arthritis   . Bipolar 1 disorder ([place])   . Bronchitis   . Depression   . Hemorrhoid   . Schizoaffective disorder ([place])   . Schizophrenia ([place])    Pt denies and reports it is schizoaffective disorder.   . Transfusion history     Past Surgical History:  Procedure Laterality Date  . CESAREAN SECTION      Family History:  Family History  Problem Relation Age of Onset  . Diabetes type II Other     Social History:  reports that she has been smoking cigarettes. She has a 8.50 pack-year smoking history. She has never used smokeless tobacco. She reports that she does not drink alcohol or use drugs.  Additional Social History:     CIWA:   COWS:    Allergies:  Allergies  Allergen Reactions  . [name] [Lurasidone Hcl] Nausea And Vomiting and Other (See Comments)    Reaction:  Shaking   . Abilify [Aripiprazole] Other (See Comments)    shaking  . Flagyl [Metronidazole Hcl] Nausea  And Vomiting  . Prolixin [Fluphenazine] Other (See Comments)    shaking  . Risperidone And Related Other (See Comments)    shaking  . Seroquel [Quetiapine Fumarate]     Patient states she had a syncopal episode after taking medication.   . Sulfa Antibiotics Nausea And Vomiting    Home Medications: (Not in a hospital admission)   OB/GYN Status:  No LMP recorded.  General Assessment Data Location of Assessment: [facility](walk-in ) TTS Assessment: In system Is this a Tele or Face-to-Face Assessment?: Face-to-Face Is this an Initial Assessment or a Re-assessment for this encounter?: Initial Assessment Patient Accompanied by:: N/A Language Other than English: No Living Arrangements: Other (Comment)(patient report she lives alone) What gender do you identify as?: Female Marital status: Single Maiden name: n/a Pregnancy Status: No Living Arrangements: Alone Can pt return to current living arrangement?: Yes Admission Status: Voluntary Is patient capable of signing voluntary admission?: Yes Referral Source: Self/Family/Friend Insurance type: Medicaid      Crisis Care Plan Living Arrangements: Alone Legal Guardian: Other:(self) Name of Psychiatrist: Dr. Glena Norfolk Name of Therapist: none   Education Status Is patient currently in school?: No Is the patient employed, unemployed or receiving disability?: Employed  Risk to self with the past 6 months Suicidal Ideation: No Has patient been a risk to self within the past 6 months prior to admission? : No Suicidal Intent: No Has patient had any  suicidal intent within the past 6 months prior to admission? : No Is patient at risk for suicide?: No Suicidal Plan?: No Has patient had any suicidal plan within the past 6 months prior to admission? : No Access to Means: No What has been your use of drugs/alcohol within the last 12 months?: denied use Previous Attempts/Gestures: No How many times?: 0 Other Self Harm  Risks: no discussed  Triggers for Past Attempts: None known Intentional Self Injurious Behavior: None Family Suicide History: Unknown Recent stressful life event(s): Other (Comment)(no taking medication as prescribed ) Persecutory voices/beliefs?: No Depression: Yes Depression Symptoms: (none reported ) Substance abuse history and/or treatment for substance abuse?: No Suicide prevention information given to non-admitted patients: Not applicable  Risk to Others within the past 6 months Homicidal Ideation: No Does patient have any lifetime risk of violence toward others beyond the six months prior to admission? : No Thoughts of Harm to Others: No Current Homicidal Intent: No Current Homicidal Plan: No Access to Homicidal Means: No Identified Victim: n/a History of harm to others?: No Assessment of Violence: None Noted Violent Behavior Description: None Noted  Does patient have access to weapons?: No Criminal Charges Pending?: No Does patient have a court date: No Is patient on probation?: No  Psychosis Hallucinations: Auditory, Visual Delusions: Unspecified  Mental Status Report Appearance/Hygiene: Other (Comment)(dress appropriately ) Eye Contact: Fair Motor Activity: Freedom of movement Speech: Logical/coherent Level of Consciousness: Alert Mood: Pleasant Affect: Appropriate to circumstance Anxiety Level: None Thought Processes: Coherent, Relevant Judgement: Impaired Orientation: Person, Place, Time, Situation, Appropriate for developmental age Obsessive Compulsive Thoughts/Behaviors: None  Cognitive Functioning Concentration: Normal Memory: Recent Intact, Remote Intact Is patient IDD: No Insight: Poor Impulse Control: Poor Appetite: Poor Have you had any weight changes? : No Change Sleep: Decreased Total Hours of Sleep: (unknown the amount of sleep ) Vegetative Symptoms: None  ADLScreening [facility] Assessment Services) Patient's cognitive ability adequate to  safely complete daily activities?: Yes Patient able to express need for assistance with ADLs?: Yes Independently performs ADLs?: Yes (appropriate for developmental age)  Prior Inpatient Therapy Prior Inpatient Therapy: Yes Prior Therapy Dates: 2019 Prior Therapy Facilty/Provider(s): [facility] Reason for Treatment: schizo-affective disorder   Prior Outpatient Therapy Prior Outpatient Therapy: Yes Prior Therapy Facilty/Provider(s): Dr. Sheppard Evens  Reason for Treatment: mental health  Does patient have an ACCT team?: No Does patient have Intensive In-House Services?  : No Does patient have [facility] services? : No Does patient have P4CC services?: No  ADL Screening (condition at time of admission) Patient's cognitive ability adequate to safely complete daily activities?: Yes Is the patient deaf or have difficulty hearing?: No Does the patient have difficulty seeing, even when wearing glasses/contacts?: No Does the patient have difficulty concentrating, remembering, or making decisions?: No Patient able to express need for assistance with ADLs?: Yes Does the patient have difficulty dressing or bathing?: No Independently performs ADLs?: Yes (appropriate for developmental age) Does the patient have difficulty walking or climbing stairs?: No       Abuse/Neglect Assessment (Assessment to be complete while patient is alone) Abuse/Neglect Assessment Can Be Completed: Yes Physical Abuse: Denies Verbal Abuse: Denies Sexual Abuse: Denies Exploitation of patient/patient's resources: Denies Self-Neglect: Denies     Regulatory affairs officer (For Healthcare) Does Patient Have a Medical Advance Directive?: No Would patient like information on creating a medical advance directive?: No - Patient declined          Disposition:  Disposition Initial Assessment Completed for this Encounter: Kathrene Bongo,  NP, recommend inpatient ) Disposition of Patient: Admit(Lashunda Marcello Moores, NP, inpatient ) Type  of inpatient treatment program: Adult Patient refused recommended treatment: No  On Site Evaluation by:   Reviewed with Physician:    Despina Hidden 11/08/2018 4:41 PM

## 2018-11-08 NOTE — Progress Notes (Signed)
Patient ID: [name], female   DOB: Apr 21, 1970, 48 y.o.   MRN: [id number]  Patient reports increased symptoms of schizoaffective disorder due to medication non compliance.  Patient reported not taking her medications because the pharmacy initially did not bring medications when the prescription ran out.  Patient had a medication change but it was not effective.  Patient symptoms continued to increase and patient came into the hospital for stabilization.  Patient is agreeable to treatment and pleasant and cooperative.   Patient reports currently hearing voices and seeing things.  Patient is able to hear clearly what one voice is saying but did not disclose. Patient reports seeing demons "not positive things"  Safety search complete patient found to be free of all injury and contraband.  Patient belongings secured and patient admitted to the unit without incident.

## 2018-11-08 NOTE — H&P (Signed)
Behavioral Health Medical Screening Exam  [name] is an 48 y.o. female.who presents as a voluntary walk-in to [facility] accompanied alone although transported by EMS. Patient reports she is currently experiencing auditory and visual hallucinations as well as paranoia.  She does not decribe any command hallucinations. She reports seeing, " demons." She states, " its the enemy." She is very disorganized and has significant thought blocking. She reports feeling paranoid and that, " people are out to get me."She is a patient of Dr. [name] (ACT team) who also spoke with patients. Per Dr. [name] her presentation is very different from her presentation in the past.  She has of schizoaffective disorder, bipolar type. She states she has not had her medications in the past three days. She denies SI or HI. She states she is not using any substance.  At this time, we are recommending inpatient psychiatric hospitalization for stabilization and safety.   Total Time spent with patient: 15 minutes  Psychiatric Specialty Exam: Physical Exam  Neurological: She is alert.    Review of Systems  Psychiatric/Behavioral: Positive for hallucinations. Negative for substance abuse. The patient has insomnia.   All other systems reviewed and are negative.   There were no vitals taken for this visit.There is no height or weight on file to calculate BMI.  General Appearance: Fairly Groomed  Eye Contact:  Fair  Speech:  Clear and Coherent and Slow  Volume:  Decreased  Mood:  Dysphoric  Affect:  Restricted  Thought Process:  Disorganized and Descriptions of Associations: Tangential; thought blocking   Orientation:  Full (Time, Place, and Person)  Thought Content:  Hallucinations: Auditory and Paranoid Ideation  Suicidal Thoughts:  No  Homicidal Thoughts:  No  Memory:  Immediate;   Fair Recent;   Fair  Judgement:  Fair  Insight:  Fair  Psychomotor Activity:  Normal  Concentration: Concentration: Fair and Attention  Span: Fair  Recall:  [employer] of Knowledge:Fair  Language: Fair  Akathisia:  Negative  Handed:  Right  AIMS (if indicated):     Assets:  Communication Skills Desire for Improvement  Sleep:       Musculoskeletal: Strength & Muscle Tone: within normal limits Gait & Station: normal Patient leans: N/A  There were no vitals taken for this visit.  Recommendations:  Based on my evaluation the patient does not appear to have an emergency medical condition.  Patient does meet criteria for psychiatric inpatient admission as per my evaluation and Dr. [name].     [name], NP 11/08/2018, 2:39 PM

## 2018-11-08 NOTE — Progress Notes (Signed)
D: Pt denies SI/HI/AVH. Pt been sleep much of the evening. Pt woke up and stated she was doing ok this evening. Pt stated she was tired.   A: Pt was offered support and encouragement. Pt was given scheduled medications. Pt was encourage to attend groups. Q 15 minute checks were done for safety.   R: safety maintained on unit.

## 2018-11-09 DIAGNOSIS — F250 Schizoaffective disorder, bipolar type: Principal | ICD-10-CM

## 2018-11-09 MED ORDER — CARBAMAZEPINE 100 MG PO CHEW
200.0000 mg | CHEWABLE_TABLET | Freq: Three times a day (TID) | ORAL | Status: DC
  Administered 2018-11-09 – 2018-11-10 (×2): 200 mg via ORAL
  Filled 2018-11-09 (×6): qty 2

## 2018-11-09 MED ORDER — CLONAZEPAM 0.5 MG PO TABS
0.5000 mg | ORAL_TABLET | Freq: Three times a day (TID) | ORAL | Status: DC
  Administered 2018-11-09 – 2018-11-10 (×2): 0.5 mg via ORAL
  Filled 2018-11-09 (×2): qty 1

## 2018-11-09 NOTE — H&P (Signed)
Psychiatric Admission Assessment Adult  Patient Identification: [name] MRN:  [id number] Date of Evaluation:  11/09/2018 Chief Complaint:  acute psychosis Principal Diagnosis: Schizoaffective/bipolar type acute exacerbation Diagnosis:  Active Problems:   Schizoaffective disorder ([place])  History of Present Illness:  Ms. [name] is 48 years old and she is well-known to strategic interventions act team she is known to have a bipolar/schizoaffective type condition, and acknowledges noncompliance with her medications for several days, this is following several weeks of intermittent compliance, and intermittent disorganized thought and behavior. Her drug screen is pending she is refused to give over laboratory work thus far.  She has an extensive psychiatric history, has responded to a combination of Tegretol and brexpiprazole when she is compliant with it.  When she is not she can become quite disorganized, often leaves town without any particular plans, has even been hospitalized in the [place] area as a result of 1 of these episodes. Past medication trials included Prolixin which was effective but caused excessive EPS and tremors, I iloperidone which was also somewhat effective without movement disorders but her dislike of the agent led to switch his to Tegretol/brexpiprazole combination that seem very effective when she took it.  She spent the majority of yesterday morning wandering around outside the hospital grounds, and not speaking eventually she came and was noted to be in a disorganized state of mind.  She displayed thought blocking and when she did speak had numerous delusions and hallucinations stating she was seeing demons they were "the enemy" and was quite paranoid.  She also described auditory hallucinations but again her history was quite disjointed and rambling.  This morning describes her self is exhausted and still displayed thought blocking as well as mild auditory  hallucinations.  Associated Signs/Symptoms: Depression Symptoms:  psychomotor agitation, (Hypo) Manic Symptoms:  Delusions, Anxiety Symptoms:  Excessive Worry, Psychotic Symptoms:  Delusions, Hallucinations: Auditory Visual Paranoia, PTSD Symptoms: NA Total Time spent with patient: 45 minutes  Past Psychiatric History: Extensive   Is the patient at risk to self? Yes.    Has the patient been a risk to self in the past 6 months? Yes.    Has the patient been a risk to self within the distant past? Yes.    Is the patient a risk to others? No.  Has the patient been a risk to others in the past 6 months? No.  Has the patient been a risk to others within the distant past? No.   Prior Inpatient Therapy: Prior Inpatient Therapy: Yes Prior Therapy Dates: 2019 Prior Therapy Facilty/Provider(s): [facility] Reason for Treatment: schizo-affective disorder  Prior Outpatient Therapy: Prior Outpatient Therapy: Yes Prior Therapy Facilty/Provider(s): Dr. Sheppard Evens  Reason for Treatment: mental health  Does patient have an ACCT team?: No Does patient have Intensive In-House Services?  : No Does patient have [facility] services? : No Does patient have P4CC services?: No  Alcohol Screening: 1. How often do you have a drink containing alcohol?: Never 2. How many drinks containing alcohol do you have on a typical day when you are drinking?: 1 or 2 3. How often do you have six or more drinks on one occasion?: Never AUDIT-C Score: 0 4. How often during the last year have you found that you were not able to stop drinking once you had started?: Never 5. How often during the last year have you failed to do what was normally expected from you becasue of drinking?: Never 6. How often during the last year have  you needed a first drink in the morning to get yourself going after a heavy drinking session?: Never 7. How often during the last year have you had a feeling of guilt of remorse after drinking?: Never 8. How  often during the last year have you been unable to remember what happened the night before because you had been drinking?: Never 9. Have you or someone else been injured as a result of your drinking?: No 10. Has a relative or friend or a doctor or another health worker been concerned about your drinking or suggested you cut down?: No Alcohol Use Disorder Identification Test Final Score (AUDIT): 0 Substance Abuse History in the last 12 months:  Yes.  presumed Consequences of Substance Abuse: NA Previous Psychotropic Medications: Yes  Psychological Evaluations: No  Past Medical History:  Past Medical History:  Diagnosis Date  . Arthritis   . Bipolar 1 disorder ([place])   . Bronchitis   . Depression   . Hemorrhoid   . Schizoaffective disorder ([place])   . Schizophrenia ([place])    Pt denies and reports it is schizoaffective disorder.   . Transfusion history     Past Surgical History:  Procedure Laterality Date  . CESAREAN SECTION     Family History:  Family History  Problem Relation Age of Onset  . Diabetes type II Other    Family Psychiatric  History: n/a Tobacco Screening:   Social History:  Social History   Substance and Sexual Activity  Alcohol Use No     Social History   Substance and Sexual Activity  Drug Use No  . Types: Marijuana   Comment: Former Biomedical scientist Social History: Marital status: Single                         Allergies:   Allergies  Allergen Reactions  . Anette Guarneri [Lurasidone Hcl] Nausea And Vomiting and Other (See Comments)    Reaction:  Shaking   . Abilify [Aripiprazole] Other (See Comments)    shaking  . Flagyl [Metronidazole Hcl] Nausea And Vomiting  . Prolixin [Fluphenazine] Other (See Comments)    shaking  . Risperidone And Related Other (See Comments)    shaking  . Seroquel [Quetiapine Fumarate]     Patient states she had a syncopal episode after taking medication.   . Sulfa Antibiotics Nausea And Vomiting   Lab Results:   Results for orders placed or performed during the hospital encounter of 11/08/18 (from the past 48 hour(s))  SARS Coronavirus 2 (CEPHEID - Performed in [facility] hospital lab), Hosp Order     Status: None   Collection Time: 11/08/18  3:06 PM   Specimen: Nasopharyngeal Swab  Result Value Ref Range   SARS Coronavirus 2 NEGATIVE NEGATIVE    Comment: (NOTE) If result is NEGATIVE SARS-CoV-2 target nucleic acids are NOT DETECTED. The SARS-CoV-2 RNA is generally detectable in upper and lower  respiratory specimens during the acute phase of infection. The lowest  concentration of SARS-CoV-2 viral copies this assay can detect is 250  copies / mL. A negative result does not preclude SARS-CoV-2 infection  and should not be used as the sole basis for treatment or other  patient management decisions.  A negative result may occur with  improper specimen collection / handling, submission of specimen other  than nasopharyngeal swab, presence of viral mutation(s) within the  areas targeted by this assay, and inadequate number of viral copies  (<250 copies /  mL). A negative result must be combined with clinical  observations, patient history, and epidemiological information. If result is POSITIVE SARS-CoV-2 target nucleic acids are DETECTED. The SARS-CoV-2 RNA is generally detectable in upper and lower  respiratory specimens dur ing the acute phase of infection.  Positive  results are indicative of active infection with SARS-CoV-2.  Clinical  correlation with patient history and other diagnostic information is  necessary to determine patient infection status.  Positive results do  not rule out bacterial infection or co-infection with other viruses. If result is PRESUMPTIVE POSTIVE SARS-CoV-2 nucleic acids MAY BE PRESENT.   A presumptive positive result was obtained on the submitted specimen  and confirmed on repeat testing.  While 2019 novel coronavirus  (SARS-CoV-2) nucleic acids may be present  in the submitted sample  additional confirmatory testing may be necessary for epidemiological  and / or clinical management purposes  to differentiate between  SARS-CoV-2 and other Sarbecovirus currently known to infect humans.  If clinically indicated additional testing with an alternate test  methodology [phone]) is advised. The SARS-CoV-2 RNA is generally  detectable in upper and lower respiratory sp ecimens during the acute  phase of infection. The expected result is Negative. Fact Sheet for Patients:  StrictlyIdeas.no Fact Sheet for Healthcare Providers: BankingDealers.co.za This test is not yet approved or cleared by the [place] FDA and has been authorized for detection and/or diagnosis of SARS-CoV-2 by FDA under an Emergency Use Authorization (EUA).  This EUA will remain in effect (meaning this test can be used) for the duration of the COVID-19 declaration under Section 564(b)(1) of the Act, 21 U.S.C. section 360bbb-3(b)(1), unless the authorization is terminated or revoked sooner. Performed at [facility], [place] [address] [place], [place] [address]     Blood Alcohol level:  Lab Results  Component Value Date   ETH <10 11/11/2017   ETH <10 52/84/1324    Metabolic Disorder Labs:  Lab Results  Component Value Date   HGBA1C 6.3 (H) 01/30/2017   MPG 134.11 01/30/2017   MPG 128 05/11/2015   No results found for: PROLACTIN Lab Results  Component Value Date   CHOL 157 01/29/2017   TRIG 119 01/29/2017   HDL 40 (L) 01/29/2017   CHOLHDL 3.9 01/29/2017   VLDL 24 01/29/2017   LDLCALC 93 01/29/2017   LDLCALC 97 05/11/2015    Current Medications: Current Facility-Administered Medications  Medication Dose Route Frequency Provider Last Rate Last Dose  . alum & mag hydroxide-simeth (MAALOX/MYLANTA) 200-200-20 MG/5ML suspension 30 mL  30 mL Oral Q4H PRN [name], NP      . Brexpiprazole TABS 2  mg  2 mg Oral BID Hampton Abbot, MD   2 mg at 11/09/18 4010  . carbamazepine (TEGRETOL) chewable tablet 200 mg  200 mg Oral TID [name], MD      . clonazePAM Bobbye [place]) tablet 0.5 mg  0.5 mg Oral TID [name], MD      . LORazepam (ATIVAN) tablet 2 mg  2 mg Oral Q6H PRN [name], MD       Or  . LORazepam (ATIVAN) injection 2 mg  2 mg Intramuscular Q6H PRN [name], MD      . magnesium hydroxide (MILK OF MAGNESIA) suspension 30 mL  30 mL Oral Daily PRN [name], NP      . temazepam (RESTORIL) capsule 30 mg  30 mg Oral QHS [name], MD       PTA Medications: Medications Prior to  Admission  Medication Sig Dispense Refill Last Dose  . amantadine (SYMMETREL) 100 MG capsule Take 1 capsule (100 mg total) by mouth 2 (two) times daily. (Patient not taking: Reported on 10/29/2018) 60 capsule 0 Not Taking at Unknown time  . haloperidol decanoate (HALDOL DECANOATE) 100 MG/ML injection Inject 0.5 mLs (50 mg total) into the muscle every 28 (twenty-eight) days. (Patient not taking: Reported on 10/29/2018) 1 mL 0 Not Taking at Unknown time  . traZODone (DESYREL) 100 MG tablet Take 1 tablet (100 mg total) by mouth at bedtime. (Patient not taking: Reported on 10/29/2018) 30 tablet 0 Not Taking at Unknown time  Psychiatric Specialty Exam: Physical Exam Neurological: Alert and oriented x4. MSK: normal ROM Respiratory: normal work of breathing  Cardiovascular: RR    Review of Systems  Psychiatric/Behavioral: Positive forhallucinations. Negative forsubstance abuse. The patienthas insomnia. All other systems reviewed and are negative.   There were no vitals taken for this visit.There is no height or weight on file to calculate BMI.  General Appearance:Fairly Groomed  Eye Contact:Fair  Speech:Clear and Coherent and Slow  Volume:Decreased  Mood:Dysphoric  Affect:Restricted  Thought Process:Disorganized and Descriptions of Associations:Tangential; thought  blocking  Orientation:Full (Time, Place, and Person)  Thought Content:Hallucinations:Auditory, Visualand Paranoid Ideation  Suicidal Thoughts:No  Homicidal Thoughts:No  Memory:Immediate;Fair Recent;Fair  Judgement:Fair  Insight:Fair  Psychomotor Activity:Normal  Concentration:Concentration:Fairand Attention Span: [place] of Knowledge:Fair  Language:Fair  Akathisia:Negative  Handed:Right  AIMS (if indicated):   Assets:Communication Skills Desire for Improvement  Sleep: 8 hours    Treatment Plan Summary: Daily contact with patient to assess and evaluate symptoms and progress in treatment and Medication management  Observation Level/Precautions:  15 minute checks  Laboratory:  UDS  Psychotherapy:    Medications:    Consultations:    Discharge Concerns:    Estimated LOS:  Other:     Physician Treatment Plan for Primary Diagnosis: <principal problem not specified> Long Term Goal(s): Improvement in symptoms so as ready for discharge  Short Term Goals: Ability to demonstrate self-control will improve, Ability to identify and develop effective coping behaviors will improve, Ability to maintain clinical measurements within normal limits will improve and Compliance with prescribed medications will improve  Physician Treatment Plan for Secondary Diagnosis: Active Problems:   Schizoaffective disorder ([place])  Long Term Goal(s): Improvement in symptoms so as ready for discharge  Short Term Goals: Ability to demonstrate self-control will improve, Ability to identify and develop effective coping behaviors will improve, Ability to maintain clinical measurements within normal limits will improve and Compliance with prescribed medications will improve  I certify that inpatient services furnished can reasonably be expected to improve the patient's condition.    [name], MD 7/23/202010:03 AM

## 2018-11-09 NOTE — Progress Notes (Signed)
Recreation Therapy Notes  INPATIENT RECREATION THERAPY ASSESSMENT  Patient Details Name: Vyctoria Dickman MRN: [id number] DOB: July 18, 1970 Today's Date: 11/09/2018  Comments:  Patient presented by EMS alone with worsening symptoms and medication noncompliance. Patient is endorsing AVH and paranoia. Patient has a hx of schizoaffective disorder bipolar type.      Information Obtained From: Chart Review  Patient Presentation: Confused(dsiorganized)  Reason for Admission (Per Patient): Active Symptoms, Med Non-Compliance  Patient Stressors: (medication change or noncompliance)  [place] of Residence:  [place]  Patient Strengths:  "ability for insight, capable of independent living, general fund of knowledge"  Patient Identified Areas of Improvement:  "my symptoms have increased, I stopped taking my medicaitons"  Patient Goal for Hospitalization:  group participation  Current SI (including self-harm):  No  Current HI:  No  Current AVH: Yes  Staff Intervention Plan: Group Attendance, Collaborate with Interdisciplinary Treatment Team  Consent to Intern Participation: N/A   Tomi Likens, LRT/CTRS   [place] 11/09/2018, 2:29 PM

## 2018-11-09 NOTE — Progress Notes (Signed)
Psychoeducational Group Note  Date:  11/09/2018 Time:  2018  Group Topic/Focus:  Wrap-Up Group:   The focus of this group is to help patients review their daily goal of treatment and discuss progress on daily workbooks.  Participation Level: Did Not Attend  Participation Quality:  Not Applicable  Affect:  Not Applicable  Cognitive:  Not Applicable  Insight:  Not Applicable  Engagement in Group: Not Applicable  Additional Comments:  The patient did not attend group this evening.   Archie Balboa S 11/09/2018, 8:18 PM

## 2018-11-09 NOTE — Progress Notes (Signed)
[facility] MD Progress Note   11/09/2018 8:26 AM Patient: [name] MRN: [id number]  Subjective: [name] is a 48 year old female with a history of schizoaffective disorder on hospital day 2 for auditory and visual hallucinations and paranoia. She reports that she slept well last night but is "exhausted" and wants to sleep all day today. She says she has not slept for three or four days before she came to the hospital. She says the voices were "too loud to sleep" and was up "brainstorming". She says these bouts of not sleeping have become more common in the past weeks. She reports she has been compliant on her medication though she has skipped medications the last three days because she only has a few left. 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 She endorses voices in her head that speak of "Obama, government matters, and sexual things". She also reports seeing "people, places, and things" that others don't see. She says "the enemy is trying to show me something" and refuses to expand further on her hallucinations. She denies SI, HI, or any substance use.   Total Time spent with patient: 15 minutes Principal Problem: Schizoaffective Disorder, bipolar type   Total Time spent with patient: 15 minutes  Past Psychiatric History:   Past Medical History: History reviewed. No pertinent past medical history. History reviewed. No pertinent surgical history. Family History: History reviewed. No pertinent family history. Denies family history of psychiatric disorders .   Social History:   EtOH: denies Tobacco: 1 pack/day for 20 years Illicits: denies   [name] lives in an apartment complex in [place] and has an ACT team.   Psychiatric Specialty Exam: Physical Exam  Neurological: Alert and oriented x4. MSK: normal ROM Respiratory: normal work of breathing  Cardiovascular: RR    Review of Systems  Psychiatric/Behavioral: Positive for hallucinations. Negative for substance abuse.  The patient has insomnia.   All other systems reviewed and are negative.   There were no vitals taken for this visit.There is no height or weight on file to calculate BMI.  General Appearance: Fairly Groomed  Eye Contact:  Fair  Speech:  Clear and Coherent and Slow  Volume:  Decreased  Mood:  Dysphoric  Affect:  Restricted  Thought Process:  Disorganized and Descriptions of Associations: Tangential; thought blocking   Orientation:  Full (Time, Place, and Person)  Thought Content:  Hallucinations: Auditory, Visual and Paranoid Ideation  Suicidal Thoughts:  No  Homicidal Thoughts:  No  Memory:  Immediate;   Fair Recent;   Fair  Judgement:  Fair  Insight:  Fair  Psychomotor Activity:  Normal  Concentration: Concentration: Fair and Attention Span: Fair  Recall:  [employer] of Knowledge:Fair  Language: Fair  Akathisia:  Negative  Handed:  Right  AIMS (if indicated):     Assets:  Communication Skills Desire for Improvement  Sleep:  8 hours     Assessment:  [name] is a 48 year old with a history of schizoaffective disorder presenting with auditory and visual hallucinations and paranoid ideation. Patient slept well and reports her mood is improving, however she is still having auditory hallucinations.   Treatment Plan Summary:  #Schizoaffective Disorder, bipolar type  1. Continue Tegretol 100 mg po tid for mood stabilization  2. Decrease klonopin   3. Continue temazepam 30 mg po daily at bedtime for sleep  4. Maintain reality-based thoughts and psychotherapy

## 2018-11-09 NOTE — Progress Notes (Signed)
D: Pt denies SI/HI/AVH. Pt is pleasant and paranoid . Pt stated she did not like the medication she took earlier. Pt refused night medications even after much encouragement.   A: Pt was offered support and encouragement.  Pt was encourage to attend groups. Q 15 minute checks were done for safety.  R: safety maintained on unit.

## 2018-11-09 NOTE — Progress Notes (Signed)
Recreation Therapy Notes  Date: 11/09/2018 Time: 10:00 am Location: 500 hall   Group Topic: Goal planning  Goal Area(s) Addresses:  Patient will work on worksheet on Goal planning. Patient will follow directions on first prompt.  Behavioral Response: Appropriate  Intervention: Worksheet  Activity:  Staff on 500 hall were provided with a worksheet on Goal planning. Staff was instructed to give it to the patients and have them work on it in place of [name]. Staff was also given 2 coloring sheets and 2 word searches and were given the option to give them out.  Education:  Ability to follow Directions, Change of thought processes Discharge Planning, Goal Planning.   Education Outcome: Acknowledges education/In group clarification offered  Clinical Observations/Feedback: . Due to COVID-19, guidelines group was not held. Group members were provided a learning activity worksheet to work on the topic and above-stated goals. LRT is available to answer any questions patient may have regarding the worksheet.  Tomi Likens, LRT/CTRS         [name] [name] 11/09/2018 1:03 PM

## 2018-11-09 NOTE — Progress Notes (Signed)
Patient ID: [name], female   DOB: May 26, 1970, 48 y.o.   MRN: [id number]  Nursing Progress Note [id number]  Patient isolative to her room on the unit. Patient compliant with scheduled medications but is observed sleeping this afternoon. Patient is seen attending groups and visible in the milieu. Patient currently denies SI/HI/AVH.   Patient is educated about and provided medication per provider's orders. Patient safety maintained with q15 min safety checks and low fall risk precautions. Emotional support given, 1:1 interaction, and active listening provided. Patient encouraged to attend meals, groups, and work on treatment plan and goals. Labs, vital signs and patient behavior monitored throughout shift. Patient encouraged to wear mask when in the milieu and is educated about coronavirus infection control precautions.  Patient compliant with mask on the unit. Patient contracts for safety with staff. Patient remains safe on the unit at this time and agrees to come to staff with any issues/concerns. Patient is interacting with peers appropriately on the unit. Will continue to support and monitor.

## 2018-11-10 MED ORDER — CARBAMAZEPINE 100 MG PO CHEW
400.0000 mg | CHEWABLE_TABLET | Freq: Every day | ORAL | Status: DC
  Administered 2018-11-10 – 2018-11-16 (×7): 400 mg via ORAL
  Filled 2018-11-10 (×9): qty 4

## 2018-11-10 MED ORDER — NICOTINE POLACRILEX 2 MG MT GUM
2.0000 mg | CHEWING_GUM | OROMUCOSAL | Status: DC | PRN
  Administered 2018-11-14 – 2018-11-17 (×5): 2 mg via ORAL
  Filled 2018-11-10: qty 1
  Filled 2018-11-10: qty 4
  Filled 2018-11-10 (×2): qty 1

## 2018-11-10 MED ORDER — HALOPERIDOL LACTATE 5 MG/ML IJ SOLN: 5.0000 mg | Freq: Four times a day (QID) | INTRAMUSCULAR | Status: DC | PRN

## 2018-11-10 MED ORDER — HALOPERIDOL 5 MG PO TABS
5.0000 mg | ORAL_TABLET | Freq: Four times a day (QID) | ORAL | Status: DC | PRN
  Administered 2018-11-11 – 2018-11-17 (×2): 5 mg via ORAL
  Filled 2018-11-10 (×2): qty 1

## 2018-11-10 MED ORDER — CARBAMAZEPINE 100 MG PO CHEW
200.0000 mg | CHEWABLE_TABLET | Freq: Every day | ORAL | Status: DC
  Administered 2018-11-11 – 2018-11-14 (×4): 200 mg via ORAL
  Filled 2018-11-10 (×6): qty 2

## 2018-11-10 NOTE — Progress Notes (Addendum)
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 Patient will follow directions on first prompt.  Behavioral Response: Appropriate  Intervention: Worksheet  Activity:  Staff on 500 hall were provided with a worksheet on Strengths Exploration. Staff was instructed to give it to the patients and have them work on it in place of [place]. Staff was also given 2 coloring sheets and 2 word searches and were given the option to give them out.  Education:  Ability to follow Directions, Change of thought processes Discharge Planning, Goal Planning.   Education Outcome: Acknowledges education/In group clarification offered  Clinical Observations/Feedback: . Due to COVID-19, guidelines group was not held. Group members were provided a learning activity worksheet to work on the topic and above-stated goals. LRT is available to answer any questions patient may have regarding the worksheet.  Tomi Likens, LRT/CTRS        Kohan Azizi L Shigeo Baugh 11/10/2018 12:01 PM

## 2018-11-10 NOTE — Tx Team (Signed)
Interdisciplinary Treatment and Diagnostic Plan Update  11/10/2018 Time of Session: 09:02am [name] MRN: [id number]  Principal Diagnosis: <principal problem not specified>  Secondary Diagnoses: Active Problems:   Schizoaffective disorder (HCC)   Current Medications:  Current Facility-Administered Medications  Medication Dose Route Frequency Provider Last Rate Last Dose  . alum & mag hydroxide-simeth (MAALOX/MYLANTA) 200-200-20 MG/5ML suspension 30 mL  30 mL Oral Q4H PRN [name], NP      . Brexpiprazole TABS 2 mg  2 mg Oral BID Hampton Abbot, MD   2 mg at 11/09/18 2045  . carbamazepine (TEGRETOL) chewable tablet 200 mg  200 mg Oral TID [name], MD   200 mg at 11/09/18 1644  . clonazePAM (KLONOPIN) tablet 0.5 mg  0.5 mg Oral TID [name], MD   0.5 mg at 11/09/18 1644  . LORazepam (ATIVAN) tablet 2 mg  2 mg Oral Q6H PRN [name], MD       Or  . LORazepam (ATIVAN) injection 2 mg  2 mg Intramuscular Q6H PRN [name], MD      . magnesium hydroxide (MILK OF MAGNESIA) suspension 30 mL  30 mL Oral Daily PRN [name], NP      . temazepam (RESTORIL) capsule 30 mg  30 mg Oral QHS [name], MD   30 mg at 11/09/18 2045   PTA Medications: Medications Prior to Admission  Medication Sig Dispense Refill Last Dose  . amantadine (SYMMETREL) 100 MG capsule Take 1 capsule (100 mg total) by mouth 2 (two) times daily. (Patient not taking: Reported on 10/29/2018) 60 capsule 0 Not Taking at Unknown time  . haloperidol decanoate (HALDOL DECANOATE) 100 MG/ML injection Inject 0.5 mLs (50 mg total) into the muscle every 28 (twenty-eight) days. (Patient not taking: Reported on 10/29/2018) 1 mL 0 Not Taking at Unknown time  . traZODone (DESYREL) 100 MG tablet Take 1 tablet (100 mg total) by mouth at bedtime. (Patient not taking: Reported on 10/29/2018) 30 tablet 0 Not Taking at Unknown time    Patient Stressors: Medication change or noncompliance  Patient Strengths:  Ability for insight Capable of independent living General fund of knowledge  Treatment Modalities: Medication Management, Group therapy, Case management,  1 to 1 session with clinician, Psychoeducation, Recreational therapy.   Physician Treatment Plan for Primary Diagnosis: <principal problem not specified> Long Term Goal(s): Improvement in symptoms so as ready for discharge Improvement in symptoms so as ready for discharge   Short Term Goals: Ability to demonstrate self-control will improve Ability to identify and develop effective coping behaviors will improve Ability to maintain clinical measurements within normal limits will improve Compliance with prescribed medications will improve Ability to demonstrate self-control will improve Ability to identify and develop effective coping behaviors will improve Ability to maintain clinical measurements within normal limits will improve Compliance with prescribed medications will improve  Medication Management: Evaluate patient's response, side effects, and tolerance of medication regimen.  Therapeutic Interventions: 1 to 1 sessions, Unit Group sessions and Medication administration.  Evaluation of Outcomes: Progressing  Physician Treatment Plan for Secondary Diagnosis: Active Problems:   Schizoaffective disorder ([place])  Long Term Goal(s): Improvement in symptoms so as ready for discharge Improvement in symptoms so as ready for discharge   Short Term Goals: Ability to demonstrate self-control will improve Ability to identify and develop effective coping behaviors will improve Ability to maintain clinical measurements within normal limits will improve Compliance with prescribed medications will improve Ability to demonstrate self-control will improve Ability to identify  and develop effective coping behaviors will improve Ability to maintain clinical measurements within normal limits will improve Compliance with prescribed medications  will improve     Medication Management: Evaluate patient's response, side effects, and tolerance of medication regimen.  Therapeutic Interventions: 1 to 1 sessions, Unit Group sessions and Medication administration.  Evaluation of Outcomes: Progressing   RN Treatment Plan for Primary Diagnosis: <principal problem not specified> Long Term Goal(s): Knowledge of disease and therapeutic regimen to maintain health will improve  Short Term Goals: Ability to participate in decision making will improve, Ability to verbalize feelings will improve, Ability to disclose and discuss suicidal ideas, Ability to identify and develop effective coping behaviors will improve and Compliance with prescribed medications will improve  Medication Management: RN will administer medications as ordered by provider, will assess and evaluate patient's response and provide education to patient for prescribed medication. RN will report any adverse and/or side effects to prescribing provider.  Therapeutic Interventions: 1 on 1 counseling sessions, Psychoeducation, Medication administration, Evaluate responses to treatment, Monitor vital signs and CBGs as ordered, Perform/monitor CIWA, COWS, AIMS and Fall Risk screenings as ordered, Perform wound care treatments as ordered.  Evaluation of Outcomes: Progressing   LCSW Treatment Plan for Primary Diagnosis: <principal problem not specified> Long Term Goal(s): Safe transition to appropriate next level of care at discharge, Engage patient in therapeutic group addressing interpersonal concerns.  Short Term Goals: Engage patient in aftercare planning with referrals and resources and Increase skills for wellness and recovery  Therapeutic Interventions: Assess for all discharge needs, 1 to 1 time with Social worker, Explore available resources and support systems, Assess for adequacy in community support network, Educate family and significant other(s) on suicide prevention,  Complete Psychosocial Assessment, Interpersonal group therapy.  Evaluation of Outcomes: Progressing   Progress in Treatment: Attending groups: No. Participating in groups: No. Taking medication as prescribed: Yes. Toleration medication: Yes. Family/Significant other contact made: No, will contact:  will contact if given consent to contact Patient understands diagnosis: No. Discussing patient identified problems/goals with staff: Yes. Medical problems stabilized or resolved: Yes. Denies suicidal/homicidal ideation: Yes. Issues/concerns per patient self-inventory: No. Other:   New problem(s) identified: No, Describe:  None  New Short Term/Long Term Goal(s): Medication stabilization, elimination of SI thoughts, and development of a comprehensive mental wellness plan.   Patient Goals:    Discharge Plan or Barriers: CSW will continue to follow up for appropriate referrals and possible discharge planning  Reason for Continuation of Hospitalization: Delusions  Hallucinations Medication stabilization  Estimated Length of Stay: 2-3 days  Attendees: Patient: 11/10/2018   Physician: Dr. [name], MD 11/10/2018  Nursing: Estill Bamberg, RN 11/10/2018   RN Care Manager: 11/10/2018   Social Worker: Ardelle Anton, LCSW 11/10/2018   Recreational Therapist:  11/10/2018   Other:  11/10/2018   Other:  11/10/2018   Other: 11/10/2018      Scribe for Treatment Team: Trecia Rogers, LCSW 11/10/2018 10:00 AM

## 2018-11-10 NOTE — B[facility] Counselor (Signed)
CSW attempted to do PSA assessment with patient but patient was asleep. This is CSW's 1st attempt.

## 2018-11-10 NOTE — Plan of Care (Signed)
  Problem: Health Behavior/Discharge Planning: Goal: Compliance with treatment plan for underlying cause of condition will improve Outcome: Progressing   Problem: Safety: Goal: Periods of time without injury will increase Outcome: Progressing   

## 2018-11-10 NOTE — Progress Notes (Signed)
Patient ID: [name], female   DOB: May 05, 1970, 48 y.o.   MRN: [id number]  Nursing Progress Note 7a-7p  Patient slept late this morning but did get up and receive her morning medications around 1000. Patient has since been observed visible in the milieu responding to internal stimuli and laughing inappropriately. Patient appears to demonstrate thought blocking but is cooperative and compliant with scheduled medications. PRN Ativan provided for increasing restlessness. Q15 minute safety checks in place. Will continue to support and monitor.

## 2018-11-10 NOTE — Progress Notes (Signed)
[facility] MD Progress Note  11/10/2018 10:36 AM [name]  MRN:  [id number] Subjective:   [name] is well-known to the service she is 48 years of age she suffers from a chronic schizoaffective/bipolar type condition and has had months of stability on a combination of Tegretol and brexpiprazole however recent issues with compliance led to approximately a month of instability. She presented voluntarily actually sitting on the bench outside this facility, or wandering the grounds, through most of the morning before actually coming in and seeking help in the afternoon. She has refused to give blood work and urine sample and I confronted her directly asking if she is hiding the fact she may be abusing cocaine or cannabis and she denies this. She is encouraged to give over the samples.  She states she slept well she is currently alert oriented to person place time day and situation but not exact date she is overall cooperative with interview process and she is requesting discharge home and she has indeed shown a great deal of improvement since her admission state of disorganization and thought blocking as well as hallucinations.  She denies current hallucinations or thoughts are coherent and nondelusional but she is not quite baseline. I explained her she probably needs to stay through the weekend to stabilize. Spoke with her act team this morning about her progress Principal Problem: Exacerbation and underlying schizoaffective/bipolar condition, rule out substance abuse Diagnosis: Active Problems:   Schizoaffective disorder ([place])  Total Time spent with patient: 20 minutes  Past Psychiatric History: When she has been manic previously she has left town and disappeared for periods of time, at one point last year being hospitalized in the [place] area  Past Medical History:  Past Medical History:  Diagnosis Date  . Arthritis   . Bipolar 1 disorder ([place])   . Bronchitis   . Depression   .  Hemorrhoid   . Schizoaffective disorder ([place])   . Schizophrenia ([place])    Pt denies and reports it is schizoaffective disorder.   . Transfusion history     Past Surgical History:  Procedure Laterality Date  . CESAREAN SECTION     Family History:  Family History  Problem Relation Age of Onset  . Diabetes type II Other    Family Psychiatric  History: ukn Social History:  Social History   Substance and Sexual Activity  Alcohol Use No     Social History   Substance and Sexual Activity  Drug Use No  . Types: Marijuana   Comment: Former user    Social History   Socioeconomic History  . Marital status: Legally Separated    Spouse name: Not on file  . Number of children: Not on file  . Years of education: Not on file  . Highest education level: Not on file  Occupational History  . Not on file  Social Needs  . Financial resource strain: Not on file  . Food insecurity    Worry: Not on file    Inability: Not on file  . Transportation needs    Medical: Not on file    Non-medical: Not on file  Tobacco Use  . Smoking status: Current Every Day Smoker    Packs/day: 0.50    Years: 17.00    Pack years: 8.50    Types: Cigarettes  . Smokeless tobacco: Never Used  Substance and Sexual Activity  . Alcohol use: No  . Drug use: No    Types: Marijuana    Comment:  Former user  . Sexual activity: Never  Lifestyle  . Physical activity    Days per week: Not on file    Minutes per session: Not on file  . Stress: Not on file  Relationships  . Social Herbalist on phone: Not on file    Gets together: Not on file    Attends religious service: Not on file    Active member of club or organization: Not on file    Attends meetings of clubs or organizations: Not on file    Relationship status: Not on file  Other Topics Concern  . Not on file  Social History Narrative   ** Merged History Encounter **       Additional Social History:                          Sleep: Good  Appetite:  Good  Current Medications: Current Facility-Administered Medications  Medication Dose Route Frequency Provider Last Rate Last Dose  . alum & mag hydroxide-simeth (MAALOX/MYLANTA) 200-200-20 MG/5ML suspension 30 mL  30 mL Oral Q4H PRN [name], NP      . Brexpiprazole TABS 2 mg  2 mg Oral BID Hampton Abbot, MD   2 mg at 11/10/18 1013  . [START ON 11/11/2018] carbamazepine (TEGRETOL) chewable tablet 200 mg  200 mg Oral Q1200 [name], MD      . carbamazepine (TEGRETOL) chewable tablet 400 mg  400 mg Oral QHS [name], MD      . LORazepam (ATIVAN) tablet 2 mg  2 mg Oral Q6H PRN [name], MD       Or  . LORazepam (ATIVAN) injection 2 mg  2 mg Intramuscular Q6H PRN [name], MD      . magnesium hydroxide (MILK OF MAGNESIA) suspension 30 mL  30 mL Oral Daily PRN [name], NP      . nicotine polacrilex (NICORETTE) gum 2 mg  2 mg Oral PRN [name], MD      . temazepam (RESTORIL) capsule 30 mg  30 mg Oral QHS [name], MD   30 mg at 11/09/18 2045    Lab Results:  Results for orders placed or performed during the hospital encounter of 11/08/18 (from the past 48 hour(s))  SARS Coronavirus 2 (CEPHEID - Performed in [place] hospital lab), Hosp Order     Status: None   Collection Time: 11/08/18  3:06 PM   Specimen: Nasopharyngeal Swab  Result Value Ref Range   SARS Coronavirus 2 NEGATIVE NEGATIVE    Comment: (NOTE) If result is NEGATIVE SARS-CoV-2 target nucleic acids are NOT DETECTED. The SARS-CoV-2 RNA is generally detectable in upper and lower  respiratory specimens during the acute phase of infection. The lowest  concentration of SARS-CoV-2 viral copies this assay can detect is 250  copies / mL. A negative result does not preclude SARS-CoV-2 infection  and should not be used as the sole basis for treatment or other  patient management decisions.  A negative result may occur with  improper specimen collection / handling,  submission of specimen other  than nasopharyngeal swab, presence of viral mutation(s) within the  areas targeted by this assay, and inadequate number of viral copies  (<250 copies / mL). A negative result must be combined with clinical  observations, patient history, and epidemiological information. If result is POSITIVE SARS-CoV-2 target nucleic acids are DETECTED. The SARS-CoV-2 RNA is generally detectable in upper and  lower  respiratory specimens dur ing the acute phase of infection.  Positive  results are indicative of active infection with SARS-CoV-2.  Clinical  correlation with patient history and other diagnostic information is  necessary to determine patient infection status.  Positive results do  not rule out bacterial infection or co-infection with other viruses. If result is PRESUMPTIVE POSTIVE SARS-CoV-2 nucleic acids MAY BE PRESENT.   A presumptive positive result was obtained on the submitted specimen  and confirmed on repeat testing.  While 2019 novel coronavirus  (SARS-CoV-2) nucleic acids may be present in the submitted sample  additional confirmatory testing may be necessary for epidemiological  and / or clinical management purposes  to differentiate between  SARS-CoV-2 and other Sarbecovirus currently known to infect humans.  If clinically indicated additional testing with an alternate test  methodology [phone]) is advised. The SARS-CoV-2 RNA is generally  detectable in upper and lower respiratory sp ecimens during the acute  phase of infection. The expected result is Negative. Fact Sheet for Patients:  StrictlyIdeas.no Fact Sheet for Healthcare Providers: BankingDealers.co.za This test is not yet approved or cleared by the [place] FDA and has been authorized for detection and/or diagnosis of SARS-CoV-2 by FDA under an Emergency Use Authorization (EUA).  This EUA will remain in effect (meaning this test can be  used) for the duration of the COVID-19 declaration under Section 564(b)(1) of the Act, 21 U.S.C. section 360bbb-3(b)(1), unless the authorization is terminated or revoked sooner. Performed at [facility], [place] [address] [place], [place] [address]     Blood Alcohol level:  Lab Results  Component Value Date   ETH <10 11/11/2017   ETH <10 03/47/4259    Metabolic Disorder Labs: Lab Results  Component Value Date   HGBA1C 6.3 (H) 01/30/2017   MPG 134.11 01/30/2017   MPG 128 05/11/2015   No results found for: PROLACTIN Lab Results  Component Value Date   CHOL 157 01/29/2017   TRIG 119 01/29/2017   HDL 40 (L) 01/29/2017   CHOLHDL 3.9 01/29/2017   VLDL 24 01/29/2017   LDLCALC 93 01/29/2017   LDLCALC 97 05/11/2015    Physical Findings: AIMS: Facial and Oral Movements Muscles of Facial Expression: None, normal Lips and Perioral Area: None, normal Jaw: None, normal Tongue: None, normal,Extremity Movements Upper (arms, wrists, hands, fingers): None, normal Lower (legs, knees, ankles, toes): None, normal, Trunk Movements Neck, shoulders, hips: None, normal, Overall Severity Severity of abnormal movements (highest score from questions above): None, normal Incapacitation due to abnormal movements: None, normal Patient's awareness of abnormal movements (rate only patient's report): No Awareness, Dental Status Current problems with teeth and/or dentures?: No Does patient usually wear dentures?: No  CIWA:    COWS:     Musculoskeletal: Strength & Muscle Tone: within normal limits Gait & Station: normal Patient leans: N/A  Psychiatric Specialty Exam: Physical Exam  ROS  Blood pressure 129/70, pulse 72, temperature 99.2 F (37.3 C), temperature source Oral, resp. rate 16, height 5\' 1"  (1.549 m), SpO2 97 %.Body mass index is 44.15 kg/m.  General Appearance: Casual  Eye Contact:  Good  Speech:  Clear and Coherent and Slow  Volume:  Decreased  Mood:   Dysphoric  Affect:  Blunt and Congruent  Thought Process:  Linear and Descriptions of Associations: Circumstantial  Orientation:  Full (Time, Place, and Person)  Thought Content:  Logical and Rumination  Suicidal Thoughts:  No  Homicidal Thoughts:  No  Memory:  Immediate;  Fair Recent;   Fair Remote;   Fair  Judgement:  Fair  Insight:  Fair  Psychomotor Activity:  Decreased  Concentration:  Concentration: Fair and Attention Span: Fair  Recall:  Good  Fund of Knowledge:  Good  Language:  Good  Akathisia:  Negative  Handed:  Right  AIMS (if indicated):     Assets:  Communication Skills Desire for Improvement  ADL's:  Intact  Cognition:  WNL  Sleep:  Number of Hours: 6.25     Treatment Plan Summary: Daily contact with patient to assess and evaluate symptoms and progress in treatment, Medication management and Plan Continue current measures but discontinue clonazepam, await urine drug screen, continue Tegretol but stagger the dosing to be 200 mg in the morning and 400 at bedtime check level when at steady state probable discharge early next week with coordination through the act team  Coady Train, MD 11/10/2018, 10:36 AM

## 2018-11-11 DIAGNOSIS — R443 Hallucinations, unspecified: Secondary | ICD-10-CM

## 2018-11-11 MED ORDER — TEMAZEPAM 30 MG PO CAPS
30.0000 mg | ORAL_CAPSULE | Freq: Every day | ORAL | Status: DC
  Administered 2018-11-11 – 2018-11-16 (×6): 30 mg via ORAL
  Filled 2018-11-11 (×4): qty 1

## 2018-11-11 MED ORDER — TEMAZEPAM 30 MG PO CAPS: 45.0000 mg | ORAL_CAPSULE | Freq: Every day | ORAL | Status: DC

## 2018-11-11 MED ORDER — TRAZODONE HCL 50 MG PO TABS
50.0000 mg | ORAL_TABLET | Freq: Every evening | ORAL | Status: DC | PRN
  Administered 2018-11-11 – 2018-11-13 (×3): 50 mg via ORAL
  Filled 2018-11-11 (×17): qty 1

## 2018-11-11 MED ORDER — TRAZODONE HCL 50 MG PO TABS: 50.0000 mg | ORAL_TABLET | Freq: Every evening | ORAL | Status: DC | PRN

## 2018-11-11 NOTE — [facility] Counselor (Signed)
CSW attempted to meet with patient for PSA assessment. Patient stopped CSW at room door and stated, "I don't want to talk right now" and closed the door. This is CSW's 2nd attempt.

## 2018-11-11 NOTE — Progress Notes (Addendum)
[facility] MD Progress Note  11/11/2018 9:33 AM [name]  MRN:  [id number] Subjective:  "I'm tired."  [name] seen lying in bed. She is calm and shows more organized speech and thoughts today. She reports fatigue this morning. She denies AVH but reports having "bad thoughts," declines to elaborate. She complains of difficulty sleeping overnight. She also complains of having nightmares "all night," which is not typical for her. 6.75 hours of sleep recorded. She continues to refuse labwork. Tegretol was increased yesterday.  From admission H&P: [name] is well-known to the service she is 48 years of age she suffers from a chronic schizoaffective/bipolar type condition and has had months of stability on a combination of Tegretol and brexpiprazole however recent issues with compliance led to approximately a month of instability. She presented voluntarily actually sitting on the bench outside this facility, or wandering the grounds, through most of the morning before actually coming in and seeking help in the afternoon.  Principal Problem: <principal problem not specified> Diagnosis: Active Problems:   Schizoaffective disorder (HCC)  Total Time spent with patient: 15 minutes  Past Psychiatric History: See admission H&P  Past Medical History:  Past Medical History:  Diagnosis Date  . Arthritis   . Bipolar 1 disorder ([place])   . Bronchitis   . Depression   . Hemorrhoid   . Schizoaffective disorder ([place])   . Schizophrenia ([place])    Pt denies and reports it is schizoaffective disorder.   . Transfusion history     Past Surgical History:  Procedure Laterality Date  . CESAREAN SECTION     Family History:  Family History  Problem Relation Age of Onset  . Diabetes type II Other    Family Psychiatric  History: See admission H&P Social History:  Social History   Substance and Sexual Activity  Alcohol Use No     Social History   Substance and Sexual Activity  Drug Use No  . Types:  Marijuana   Comment: Former user    Social History   Socioeconomic History  . Marital status: Legally Separated    Spouse name: Not on file  . Number of children: Not on file  . Years of education: Not on file  . Highest education level: Not on file  Occupational History  . Not on file  Social Needs  . Financial resource strain: Not on file  . Food insecurity    Worry: Not on file    Inability: Not on file  . Transportation needs    Medical: Not on file    Non-medical: Not on file  Tobacco Use  . Smoking status: Current Every Day Smoker    Packs/day: 0.50    Years: 17.00    Pack years: 8.50    Types: Cigarettes  . Smokeless tobacco: Never Used  Substance and Sexual Activity  . Alcohol use: No  . Drug use: No    Types: Marijuana    Comment: Former user  . Sexual activity: Never  Lifestyle  . Physical activity    Days per week: Not on file    Minutes per session: Not on file  . Stress: Not on file  Relationships  . Social Herbalist on phone: Not on file    Gets together: Not on file    Attends religious service: Not on file    Active member of club or organization: Not on file    Attends meetings of clubs or organizations: Not on file  Relationship status: Not on file  Other Topics Concern  . Not on file  Social History Narrative   ** Merged History Encounter **       Additional Social History:                         Sleep: Fair  Appetite:  Fair  Current Medications: Current Facility-Administered Medications  Medication Dose Route Frequency Provider Last Rate Last Dose  . alum & mag hydroxide-simeth (MAALOX/MYLANTA) 200-200-20 MG/5ML suspension 30 mL  30 mL Oral Q4H PRN [name], NP      . Brexpiprazole TABS 2 mg  2 mg Oral BID Hampton Abbot, MD   2 mg at 11/11/18 1610  . carbamazepine (TEGRETOL) chewable tablet 200 mg  200 mg Oral Q1200 [name], MD      . carbamazepine (TEGRETOL) chewable tablet 400 mg  400 mg Oral  QHS [name], MD   400 mg at 11/10/18 2012  . haloperidol (HALDOL) tablet 5 mg  5 mg Oral Q6H PRN [name], MD      . haloperidol lactate (HALDOL) injection 5 mg  5 mg Intramuscular Q6H PRN [name], MD      . LORazepam (ATIVAN) tablet 2 mg  2 mg Oral Q6H PRN [name], MD   2 mg at 11/10/18 1151   Or  . LORazepam (ATIVAN) injection 2 mg  2 mg Intramuscular Q6H PRN [name], MD      . magnesium hydroxide (MILK OF MAGNESIA) suspension 30 mL  30 mL Oral Daily PRN [name], NP      . nicotine polacrilex (NICORETTE) gum 2 mg  2 mg Oral PRN [name], MD      . temazepam (RESTORIL) capsule 30 mg  30 mg Oral QHS [name], MD   30 mg at 11/10/18 2012    Lab Results: No results found for this or any previous visit (from the past 48 hour(s)).  Blood Alcohol level:  Lab Results  Component Value Date   ETH <10 11/11/2017   ETH <10 96/07/5407    Metabolic Disorder Labs: Lab Results  Component Value Date   HGBA1C 6.3 (H) 01/30/2017   MPG 134.11 01/30/2017   MPG 128 05/11/2015   No results found for: PROLACTIN Lab Results  Component Value Date   CHOL 157 01/29/2017   TRIG 119 01/29/2017   HDL 40 (L) 01/29/2017   CHOLHDL 3.9 01/29/2017   VLDL 24 01/29/2017   LDLCALC 93 01/29/2017   LDLCALC 97 05/11/2015    Physical Findings: AIMS: Facial and Oral Movements Muscles of Facial Expression: None, normal Lips and Perioral Area: None, normal Jaw: None, normal Tongue: None, normal,Extremity Movements Upper (arms, wrists, hands, fingers): None, normal Lower (legs, knees, ankles, toes): None, normal, Trunk Movements Neck, shoulders, hips: None, normal, Overall Severity Severity of abnormal movements (highest score from questions above): None, normal Incapacitation due to abnormal movements: None, normal Patient's awareness of abnormal movements (rate only patient's report): No Awareness, Dental Status Current problems with teeth and/or dentures?: No Does  patient usually wear dentures?: No  CIWA:    COWS:     Musculoskeletal: Strength & Muscle Tone: within normal limits Gait & Station: normal Patient leans: N/A  Psychiatric Specialty Exam: Physical Exam  Nursing note and vitals reviewed. Constitutional: She is oriented to person, place, and time. She appears well-developed and well-nourished.  Cardiovascular: Normal rate.  Respiratory: Effort normal.  Neurological: She is alert  and oriented to person, place, and time.    Review of Systems  Constitutional: Negative.   Psychiatric/Behavioral: Negative for depression, hallucinations and suicidal ideas. The patient is not nervous/anxious and does not have insomnia.     Blood pressure 114/60, pulse 72, temperature 99.2 F (37.3 C), temperature source Oral, resp. rate 16, height 5\' 1"  (1.549 m), SpO2 97 %.Body mass index is 44.15 kg/m.  General Appearance: Disheveled  Eye Contact:  Minimal  Speech:  Slow  Volume:  Decreased  Mood:  Euthymic  Affect:  Congruent  Thought Process:  Descriptions of Associations: Tangential  Orientation:  Full (Time, Place, and Person)  Thought Content:  Logical  Suicidal Thoughts:  No  Homicidal Thoughts:  No  Memory:  Immediate;   Fair Recent;   Fair  Judgement:  Fair  Insight:  Fair  Psychomotor Activity:  Decreased  Concentration:  Concentration: Fair  Recall:  [employer] of Knowledge:  Fair  Language:  Fair  Akathisia:  No  Handed:  Right  AIMS (if indicated):     Assets:  Communication Skills Desire for Improvement Resilience  ADL's:  Intact  Cognition:  WNL  Sleep:  Number of Hours: 6.75     Treatment Plan Summary: Daily contact with patient to assess and evaluate symptoms and progress in treatment and Medication management   Continue inpatient hospitalization.  Continue Tegretol 200 mg QAM, 400 mg PO QHS for mood  Continue Rexulti 2 mg PO BID for psychosis Continue Haldol/Ativan PRN psychosis Continue Restoril to 45 mg PO  QHS for insomnia Start trazodone 50 mg PO QHS PRN insomnia  Patient will participate in the therapeutic group milieu.  Discharge disposition in progress.   [name], NP 11/11/2018, 9:33 AM   Agree with NP progress note

## 2018-11-11 NOTE — Progress Notes (Signed)
D. Pt has remained in her room for much of the morning- reporting that she hasn't been sleeping. Pt was somewhat irritable this am- refusing to have an EKG, stating, " I've had so many EKG's I'm not going to do it". Per staff, pt was observed laughing out loud to herself in her room, and appeared to be responding to internal stimuli. Marland Kitchen Pt currently denies SI/HI  A. Labs and vitals monitored. Pt compliant with medications. Pt supported emotionally and encouraged to express concerns and ask questions.   R. Pt remains safe with 15 minute checks. Will continue POC.

## 2018-11-11 NOTE — Progress Notes (Signed)
Patient was observed sitting in the dayroom briefly and appeared to be responding the internal stimuli. She ate a snack, watched tv briefly before returning to her room to rest. She received her medications and returned to her room to rest. Safety  Maintained on unit with 15 min check.

## 2018-11-11 NOTE — Progress Notes (Signed)
[place] NOVEL CORONAVIRUS (COVID-19) DAILY CHECK-OFF SYMPTOMS - answer yes or no to each - every day NO YES  Have you had a fever in the past 24 hours?  . Fever (Temp > 37.80C / 100F) X   Have you had any of these symptoms in the past 24 hours? . New Cough .  Sore Throat  .  Shortness of Breath .  Difficulty Breathing .  Unexplained Body Aches   X   Have you had any one of these symptoms in the past 24 hours not related to allergies?   . Runny Nose .  Nasal Congestion .  Sneezing   X   If you have had runny nose, nasal congestion, sneezing in the past 24 hours, has it worsened?  X   EXPOSURES - check yes or no X   Have you traveled outside the state in the past 14 days?  X   Have you been in contact with someone with a confirmed diagnosis of COVID-19 or PUI in the past 14 days without wearing appropriate PPE?  X   Have you been living in the same home as a person with confirmed diagnosis of COVID-19 or a PUI (household contact)?    X   Have you been diagnosed with COVID-19?    X              What to do next: Answered NO to all: Answered YES to anything:   Proceed with unit schedule Follow the [facility] Inpatient Flowsheet.   

## 2018-11-11 NOTE — [facility] Group Notes (Signed)
  [facility] LCSW Group Therapy Note  Date/Time: 11/11/2018 @ 10am  Type of Therapy/Topic:  Group Therapy:  Emotion Regulation  Participation Level:  Did Not Attend   Mood: Did not attend   Description of Group:    The purpose of this group is to assist patients in learning to regulate negative emotions and experience positive emotions. Patients will be guided to discuss ways in which they have been vulnerable to their negative emotions. These vulnerabilities will be juxtaposed with experiences of positive emotions or situations, and patients challenged to use positive emotions to combat negative ones. Special emphasis will be placed on coping with negative emotions in conflict situations, and patients will process healthy conflict resolution skills.  Therapeutic Goals: 1. Patient will identify two positive emotions or experiences to reflect on in order to balance out negative emotions:  2. Patient will label two or more emotions that they find the most difficult to experience:  3. Patient will be able to demonstrate positive conflict resolution skills through discussion or role plays:   Summary of Patient Progress:   Patient did not attend group today.     Therapeutic Modalities:   Cognitive Behavioral Therapy Feelings Identification Dialectical Behavioral Therapy   Ardelle Anton, LCSW

## 2018-11-12 NOTE — Progress Notes (Signed)
   11/12/18 0838  COVID-19 Daily Checkoff  Have you had a fever (temp > 37.80C/100F)  in the past 24 hours?  No  If you have had runny nose, nasal congestion, sneezing in the past 24 hours, has it worsened? No  COVID-19 EXPOSURE  Have you traveled outside the state in the past 14 days? No  Have you been in contact with someone with a confirmed diagnosis of COVID-19 or PUI in the past 14 days without wearing appropriate PPE? No  Have you been living in the same home as a person with confirmed diagnosis of COVID-19 or a PUI (household contact)? No  Have you been diagnosed with COVID-19? No

## 2018-11-12 NOTE — Progress Notes (Signed)
Pt did not attend wrap-up group   

## 2018-11-12 NOTE — Progress Notes (Signed)
Patient has been isolative to her room but did come out to eat snack. She reports that she wants to leave and when writer asked what she would do once she is discharged. She replied that she needs to find her somewhere to stay. She is still preoccupied and responding to internal stimuli. Safety maintained with 15 min checks. She was compliant with scheduled hs medications.

## 2018-11-12 NOTE — Progress Notes (Addendum)
[facility] MD Progress Note  11/12/2018 11:01 AM [name]  MRN:  [id number] Subjective:  "I'm fine."  [name] seen lying in bed. She is flat, groggy and provides little information. When asked about sleep, she states, "I thought I was on a boat. I slept well." Record shows 5.75 hours of sleep. Denies nightmares. She denies SI/HI/AVH. Per nursing report she has frequently been observed laughing in her room alone and responding to internal stimuli and is irritable at times. No agitated or disruptive behaviors on the unit. Per prior notes she did well on combination of Rexulti and Tegretol in the past before discontinuing medications. She denies medication side effects.  From admission H&P: Arickais well-known to the service she is 48 years of age she suffers from a chronic schizoaffective/bipolar type condition and has had months of stability on a combination of Tegretol and brexpiprazole however recent issues with compliance led to approximately a month of instability. She presented voluntarily actually sitting on the bench outside this facility, or wandering the grounds, through most of the morning before actually coming in and seeking help in the afternoon.  Principal Problem: <principal problem not specified> Diagnosis: Active Problems:   Schizoaffective disorder (HCC)  Total Time spent with patient: 15 minutes  Past Psychiatric History: See admission H&P  Past Medical History:  Past Medical History:  Diagnosis Date  . Arthritis   . Bipolar 1 disorder ([place])   . Bronchitis   . Depression   . Hemorrhoid   . Schizoaffective disorder ([place])   . Schizophrenia ([place])    Pt denies and reports it is schizoaffective disorder.   . Transfusion history     Past Surgical History:  Procedure Laterality Date  . CESAREAN SECTION     Family History:  Family History  Problem Relation Age of Onset  . Diabetes type II Other    Family Psychiatric  History: See admission H&P Social History:   Social History   Substance and Sexual Activity  Alcohol Use No     Social History   Substance and Sexual Activity  Drug Use No  . Types: Marijuana   Comment: Former user    Social History   Socioeconomic History  . Marital status: Legally Separated    Spouse name: Not on file  . Number of children: Not on file  . Years of education: Not on file  . Highest education level: Not on file  Occupational History  . Not on file  Social Needs  . Financial resource strain: Not on file  . Food insecurity    Worry: Not on file    Inability: Not on file  . Transportation needs    Medical: Not on file    Non-medical: Not on file  Tobacco Use  . Smoking status: Current Every Day Smoker    Packs/day: 0.50    Years: 17.00    Pack years: 8.50    Types: Cigarettes  . Smokeless tobacco: Never Used  Substance and Sexual Activity  . Alcohol use: No  . Drug use: No    Types: Marijuana    Comment: Former user  . Sexual activity: Never  Lifestyle  . Physical activity    Days per week: Not on file    Minutes per session: Not on file  . Stress: Not on file  Relationships  . Social Herbalist on phone: Not on file    Gets together: Not on file    Attends religious service:  Not on file    Active member of club or organization: Not on file    Attends meetings of clubs or organizations: Not on file    Relationship status: Not on file  Other Topics Concern  . Not on file  Social History Narrative   ** Merged History Encounter **       Additional Social History:                         Sleep: Good  Appetite:  Good  Current Medications: Current Facility-Administered Medications  Medication Dose Route Frequency Provider Last Rate Last Dose  . alum & mag hydroxide-simeth (MAALOX/MYLANTA) 200-200-20 MG/5ML suspension 30 mL  30 mL Oral Q4H PRN [name], NP      . Brexpiprazole TABS 2 mg  2 mg Oral BID Hampton Abbot, MD   2 mg at 11/12/18 4098  .  carbamazepine (TEGRETOL) chewable tablet 200 mg  200 mg Oral Q1200 [name], MD   200 mg at 11/11/18 1209  . carbamazepine (TEGRETOL) chewable tablet 400 mg  400 mg Oral QHS [name], MD   400 mg at 11/11/18 2043  . haloperidol (HALDOL) tablet 5 mg  5 mg Oral Q6H PRN [name], MD   5 mg at 11/11/18 1647  . haloperidol lactate (HALDOL) injection 5 mg  5 mg Intramuscular Q6H PRN [name], MD      . LORazepam (ATIVAN) tablet 2 mg  2 mg Oral Q6H PRN [name], MD   2 mg at 11/11/18 1647   Or  . LORazepam (ATIVAN) injection 2 mg  2 mg Intramuscular Q6H PRN [name], MD      . magnesium hydroxide (MILK OF MAGNESIA) suspension 30 mL  30 mL Oral Daily PRN [name], NP      . nicotine polacrilex (NICORETTE) gum 2 mg  2 mg Oral PRN [name], MD      . temazepam (RESTORIL) capsule 30 mg  30 mg Oral QHS [name], NP   30 mg at 11/11/18 2042  . traZODone (DESYREL) tablet 50 mg  50 mg Oral QHS,MR X 1 [name], NP   50 mg at 11/11/18 2043    Lab Results: No results found for this or any previous visit (from the past 67 hour(s)).  Blood Alcohol level:  Lab Results  Component Value Date   ETH <10 11/11/2017   ETH <10 11/91/4782    Metabolic Disorder Labs: Lab Results  Component Value Date   HGBA1C 6.3 (H) 01/30/2017   MPG 134.11 01/30/2017   MPG 128 05/11/2015   No results found for: PROLACTIN Lab Results  Component Value Date   CHOL 157 01/29/2017   TRIG 119 01/29/2017   HDL 40 (L) 01/29/2017   CHOLHDL 3.9 01/29/2017   VLDL 24 01/29/2017   LDLCALC 93 01/29/2017   LDLCALC 97 05/11/2015    Physical Findings: AIMS: Facial and Oral Movements Muscles of Facial Expression: None, normal Lips and Perioral Area: None, normal Jaw: None, normal Tongue: None, normal,Extremity Movements Upper (arms, wrists, hands, fingers): None, normal Lower (legs, knees, ankles, toes): None, normal, Trunk Movements Neck, shoulders, hips: None, normal, Overall  Severity Severity of abnormal movements (highest score from questions above): None, normal Incapacitation due to abnormal movements: None, normal Patient's awareness of abnormal movements (rate only patient's report): No Awareness, Dental Status Current problems with teeth and/or dentures?: No Does patient usually wear dentures?: No  CIWA:  COWS:     Musculoskeletal: Strength & Muscle Tone: within normal limits Gait & Station: normal Patient leans: N/A  Psychiatric Specialty Exam: Physical Exam  Nursing note and vitals reviewed. Constitutional: She is oriented to person, place, and time. She appears well-developed and well-nourished.  Cardiovascular: Normal rate.  Respiratory: Effort normal.  Neurological: She is alert and oriented to person, place, and time.    Review of Systems  Constitutional: Negative.   Psychiatric/Behavioral: Positive for hallucinations. Negative for depression, substance abuse and suicidal ideas. The patient is not nervous/anxious and does not have insomnia.     Blood pressure (!) 111/57, pulse (!) 53, temperature 99.2 F (37.3 C), temperature source Oral, resp. rate 16, height 5\' 1"  (1.549 m), SpO2 97 %.Body mass index is 44.15 kg/m.  General Appearance: Disheveled  Eye Contact:  Minimal  Speech:  Slow  Volume:  Decreased  Mood:  Euthymic  Affect:  Flat  Thought Process:  Disorganized  Orientation:  Full (Time, Place, and Person)  Thought Content:  Tangential  Suicidal Thoughts:  No  Homicidal Thoughts:  No  Memory:  Immediate;   Fair Recent;   Fair  Judgement:  Intact  Insight:  Lacking  Psychomotor Activity:  Decreased  Concentration:  Concentration: Fair and Attention Span: Fair  Recall:  [employer] of Knowledge:  Fair  Language:  Fair  Akathisia:  No  Handed:  Right  AIMS (if indicated):     Assets:  Financial Resources/Insurance Leisure Time Resilience  ADL's:  Intact  Cognition:  WNL  Sleep:  Number of Hours: 5.75      Treatment Plan Summary: Daily contact with patient to assess and evaluate symptoms and progress in treatment and Medication management   Continue inpatient hospitalization. Check Tegretol level, CBC diff, and LFTs.  Continue Tegretol 200 mg QAM, 400 mg PO QHS for mood  Continue Rexulti 2 mg PO BID for psychosis Continue Haldol/Ativan PRN psychosis Continue Restoril to 45 mg PO QHS for insomnia Continue trazodone 50 mg PO QHS PRN insomnia  Patient will participate in the therapeutic group milieu.  Discharge disposition in progress.   [name], NP 11/12/2018, 11:01 AM   Agree with NP progress note

## 2018-11-12 NOTE — Progress Notes (Signed)
D: Patient presents labile, isolative. She takes naps throughout the day, and is not seen often in the milieu. Patient denies SI/HI/AVH. Although, she is heard laughing to herself loudly in her room. She appears to be responding to internal stimuli. She can be irritable at times. Her room is messy, and she is unwilling to keep it clean, organized. She appears disheveled. A: Patient checked q15 min, and checks reviewed. Reviewed medication changes with patient and educated on side effects. Educated patient on importance of attending group therapy sessions and educated on several coping skills. Encouarged participation in milieu through recreation therapy and attending meals with peers. Support and encouragement provided. Fluids offered. R: Patient receptive to education on medications, and is medication compliant. Patient contracts for safety on the unit.

## 2018-11-12 NOTE — Progress Notes (Signed)
Writer has observed patient asleep the beginning of the shift and she came to the dayroom for snack and sat in there with peers and watched music videos. She was compliant with her medications. She returned to her room but kept coming up to the nursing station wanting the phone number to the detention center, then came back asking how [place] is [address] [place] [address]. She also asked about getting someone to do a wellness check t make sure no one was in her place and if she left the stove on. She still has disorganized thought process and thought blocking. She continues to respond to internal stimuli. Safety maintained on unit with 15 min checks.

## 2018-11-13 LAB — CBC WITH DIFFERENTIAL/PLATELET
Abs Immature Granulocytes: 0.02 10*3/uL (ref 0.00–0.07)
Basophils Absolute: 0 10*3/uL (ref 0.0–0.1)
Basophils Relative: 0 %
Eosinophils Absolute: 0.2 10*3/uL (ref 0.0–0.5)
Eosinophils Relative: 2 %
HCT: 32.3 % — ABNORMAL LOW (ref 36.0–46.0)
Hemoglobin: 10 g/dL — ABNORMAL LOW (ref 12.0–15.0)
Immature Granulocytes: 0 %
Lymphocytes Relative: 38 %
Lymphs Abs: 3.6 10*3/uL (ref 0.7–4.0)
MCH: 27.5 pg (ref 26.0–34.0)
MCHC: 31 g/dL (ref 30.0–36.0)
MCV: 89 fL (ref 80.0–100.0)
Monocytes Absolute: 0.5 10*3/uL (ref 0.1–1.0)
Monocytes Relative: 5 %
Neutro Abs: 5.3 10*3/uL (ref 1.7–7.7)
Neutrophils Relative %: 55 %
Platelets: 286 10*3/uL (ref 150–400)
RBC: 3.63 MIL/uL — ABNORMAL LOW (ref 3.87–5.11)
RDW: 17.6 % — ABNORMAL HIGH (ref 11.5–15.5)
WBC: 9.6 10*3/uL (ref 4.0–10.5)
nRBC: 0 % (ref 0.0–0.2)

## 2018-11-13 LAB — HEPATIC FUNCTION PANEL
ALT: 12 U/L (ref 0–44)
AST: 13 U/L — ABNORMAL LOW (ref 15–41)
Albumin: 3.6 g/dL (ref 3.5–5.0)
Alkaline Phosphatase: 79 U/L (ref 38–126)
Bilirubin, Direct: <0.1 mg/dL (ref 0.0–0.2)
Total Bilirubin: 0.2 mg/dL — ABNORMAL LOW (ref 0.3–1.2)
Total Protein: 7.3 g/dL (ref 6.5–8.1)

## 2018-11-13 LAB — CARBAMAZEPINE LEVEL, TOTAL: Carbamazepine Lvl: 12.8 ug/mL — ABNORMAL HIGH (ref 4.0–12.0)

## 2018-11-13 MED ORDER — RISPERIDONE 3 MG PO TABS
3.0000 mg | ORAL_TABLET | Freq: Two times a day (BID) | ORAL | Status: DC
  Administered 2018-11-13 – 2018-11-14 (×2): 3 mg via ORAL
  Filled 2018-11-13 (×4): qty 1

## 2018-11-13 MED ORDER — BENZTROPINE MESYLATE 1 MG PO TABS
1.0000 mg | ORAL_TABLET | Freq: Two times a day (BID) | ORAL | Status: DC
  Administered 2018-11-13 – 2018-11-17 (×8): 1 mg via ORAL
  Filled 2018-11-13 (×12): qty 1

## 2018-11-13 NOTE — Progress Notes (Signed)
[place] NOVEL CORONAVIRUS (COVID-19) DAILY CHECK-OFF SYMPTOMS - answer yes or no to each - every day NO YES  Have you had a fever in the past 24 hours?  . Fever (Temp > 37.80C / 100F) X   Have you had any of these symptoms in the past 24 hours? . New Cough .  Sore Throat  .  Shortness of Breath .  Difficulty Breathing .  Unexplained Body Aches   X   Have you had any one of these symptoms in the past 24 hours not related to allergies?   . Runny Nose .  Nasal Congestion .  Sneezing   X   If you have had runny nose, nasal congestion, sneezing in the past 24 hours, has it worsened?  X   EXPOSURES - check yes or no X   Have you traveled outside the state in the past 14 days?  X   Have you been in contact with someone with a confirmed diagnosis of COVID-19 or PUI in the past 14 days without wearing appropriate PPE?  X   Have you been living in the same home as a person with confirmed diagnosis of COVID-19 or a PUI (household contact)?    X   Have you been diagnosed with COVID-19?    X              What to do next: Answered NO to all: Answered YES to anything:   Proceed with unit schedule Follow the [facility] Inpatient Flowsheet.   

## 2018-11-13 NOTE — Progress Notes (Signed)
Recreation Therapy Notes  Date: 7.27.20 Time: 1000 Location: 500 Hall Dayroom  Group Topic: Wellness  Goal Area(s) Addresses:  Patient will define components of whole wellness. Patient will verbalize benefit of whole wellness.  Intervention: Exercise  Activity: Wellness.  LRT led group in a series of stretches.  Each patient led the group in exercises of their choosing.  Group was to complete at least 30 minutes of exercise.  Patients were allowed to take water breaks and rest breaks as needed.  Education: Wellness, Dentist.   Education Outcome: Acknowledges education/In group clarification offered/Needs additional education.   Clinical Observations/Feedback: Pt did not attend group session.     [name], LRT/CTRS         Ria Comment, Bensen [place] A 11/13/2018 11:19 AM

## 2018-11-13 NOTE — Progress Notes (Signed)
Patient ID: [name], female   DOB: 1971-03-13, 48 y.o.   MRN: [id number]  Nursing Progress Note 7a-7p   Patient remains flat, guarded and forwards little during interactions. Patient is compliant with medications. Patient appears to be responding to internal stimuli. q15 minute safety checks in place. Patient remains safe on the unit. Will continue to support and monitor with plan of care.  Patient's self-inventory sheet Rated Energy Level  Normal  Rated Sleep  Good  Rated Appetite  Good  Rated Anxiety (0-10)  0  Rated Hopelessness (0-10)  0  Rated Depression (0-10)  0  Daily Goal  "get out in public"  Any Additional Comments:

## 2018-11-13 NOTE — Progress Notes (Signed)
[facility] MD Progress Note  11/13/2018 9:00 AM [name] [name]  MRN:  [id number]  [name] is a 48 year old woman with a history of schizoaffective disorder/bipolar type on hospital day six for medication noncompliance and disorganized behavior.   Subjective: She says she slept well. She endorses some internal voices that are "nagging me about family matters". These voices have been coming and going but have been persistent over the past few days. She expresses that she is "ready to go home" and that she "just needs more medicine during the day to make sure the voices don't come back". She denies visual hallucinations, SI, or HI. She reports 1-2 drinks of alcohol per night but denies use of marijuana or other illicit substances.   Principal Problem: <principal problem not specified> Diagnosis: Active Problems:   Schizoaffective disorder ([place])  Total Time spent with patient: 15 minutes  Past Psychiatric History: Pt is well known. See admission H&P  Past Medical History:  Past Medical History:  Diagnosis Date  . Arthritis   . Bipolar 1 disorder ([place])   . Bronchitis   . Depression   . Hemorrhoid   . Schizoaffective disorder ([place])   . Schizophrenia ([place])    Pt denies and reports it is schizoaffective disorder.   . Transfusion history     Past Surgical History:  Procedure Laterality Date  . CESAREAN SECTION     Family History:  Family History  Problem Relation Age of Onset  . Diabetes type II Other    Family Psychiatric  History: See admission H&P Social History:  Social History   Substance and Sexual Activity  Alcohol Use No     Social History   Substance and Sexual Activity  Drug Use No  . Types: Marijuana   Comment: Former user    Social History   Socioeconomic History  . Marital status: Legally Separated    Spouse name: Not on file  . Number of children: Not on file  . Years of education: Not on file  . Highest education level: Not on file  Occupational History   . Not on file  Social Needs  . Financial resource strain: Not on file  . Food insecurity    Worry: Not on file    Inability: Not on file  . Transportation needs    Medical: Not on file    Non-medical: Not on file  Tobacco Use  . Smoking status: Current Every Day Smoker    Packs/day: 0.50    Years: 17.00    Pack years: 8.50    Types: Cigarettes  . Smokeless tobacco: Never Used  Substance and Sexual Activity  . Alcohol use: No  . Drug use: No    Types: Marijuana    Comment: Former user  . Sexual activity: Never  Lifestyle  . Physical activity    Days per week: Not on file    Minutes per session: Not on file  . Stress: Not on file  Relationships  . Social Herbalist on phone: Not on file    Gets together: Not on file    Attends religious service: Not on file    Active member of club or organization: Not on file    Attends meetings of clubs or organizations: Not on file    Relationship status: Not on file  Other Topics Concern  . Not on file  Social History Narrative   ** Merged History Encounter **  Additional Social History:     Ms. [name] lives in an apartment in [place]. She has an ACT team.   EtOH: 1-2 drinks per night Illicits: denies                     Sleep: Good  Appetite:  Good  Current Medications: Current Facility-Administered Medications  Medication Dose Route Frequency Provider Last Rate Last Dose  . alum & mag hydroxide-simeth (MAALOX/MYLANTA) 200-200-20 MG/5ML suspension 30 mL  30 mL Oral Q4H PRN [name], NP      . Brexpiprazole TABS 2 mg  2 mg Oral BID [place] Abbot, MD   2 mg at 11/13/18 0803  . carbamazepine (TEGRETOL) chewable tablet 200 mg  200 mg Oral Q1200 [name], MD   200 mg at 11/13/18 0802  . carbamazepine (TEGRETOL) chewable tablet 400 mg  400 mg Oral QHS [name], MD   400 mg at 11/12/18 2109  . haloperidol (HALDOL) tablet 5 mg  5 mg Oral Q6H PRN [name], MD   5 mg at 11/11/18 1647   . haloperidol lactate (HALDOL) injection 5 mg  5 mg Intramuscular Q6H PRN [name], MD      . LORazepam (ATIVAN) tablet 2 mg  2 mg Oral Q6H PRN [name], MD   2 mg at 11/11/18 1647   Or  . LORazepam (ATIVAN) injection 2 mg  2 mg Intramuscular Q6H PRN [name], MD      . magnesium hydroxide (MILK OF MAGNESIA) suspension 30 mL  30 mL Oral Daily PRN [name], NP      . nicotine polacrilex (NICORETTE) gum 2 mg  2 mg Oral PRN [name], MD      . temazepam (RESTORIL) capsule 30 mg  30 mg Oral QHS [name], NP   30 mg at 11/12/18 2109  . traZODone (DESYREL) tablet 50 mg  50 mg Oral QHS,MR X 1 [name], NP   50 mg at 11/12/18 2109    Lab Results:  Results for orders placed or performed during the hospital encounter of 11/08/18 (from the past 48 hour(s))  CBC with Differential/Platelet     Status: Abnormal   Collection Time: 11/13/18  6:35 AM  Result Value Ref Range   WBC 9.6 4.0 - 10.5 K/uL   RBC 3.63 (L) 3.87 - 5.11 MIL/uL   Hemoglobin 10.0 (L) 12.0 - 15.0 g/dL   HCT 32.3 (L) 36.0 - 46.0 %   MCV 89.0 80.0 - 100.0 fL   MCH 27.5 26.0 - 34.0 pg   MCHC 31.0 30.0 - 36.0 g/dL   RDW 17.6 (H) 11.5 - 15.5 %   Platelets 286 150 - 400 K/uL   nRBC 0.0 0.0 - 0.2 %   Neutrophils Relative % 55 %   Neutro Abs 5.3 1.7 - 7.7 K/uL   Lymphocytes Relative 38 %   Lymphs Abs 3.6 0.7 - 4.0 K/uL   Monocytes Relative 5 %   Monocytes Absolute 0.5 0.1 - 1.0 K/uL   Eosinophils Relative 2 %   Eosinophils Absolute 0.2 0.0 - 0.5 K/uL   Basophils Relative 0 %   Basophils Absolute 0.0 0.0 - 0.1 K/uL   Immature Granulocytes 0 %   Abs Immature Granulocytes 0.02 0.00 - 0.07 K/uL    Comment: Performed at [facility], [place] [address], [place], [place] [address]  Hepatic function panel     Status: Abnormal   Collection Time: 11/13/18  6:35 AM  Result Value Ref Range   Total Protein 7.3 6.5 - 8.1 g/dL   Albumin 3.6 3.5 - 5.0 g/dL   AST 13 (L) 15 - 41 U/L   ALT 12  0 - 44 U/L   Alkaline Phosphatase 79 38 - 126 U/L   Total Bilirubin 0.2 (L) 0.3 - 1.2 mg/dL   Bilirubin, Direct <0.1 0.0 - 0.2 mg/dL   Indirect Bilirubin NOT CALCULATED 0.3 - 0.9 mg/dL    Comment: Performed at [facility], [place] [address], [place], [place] [address]    Blood Alcohol level:  Lab Results  Component Value Date   ETH <10 11/11/2017   ETH <10 95/28/4132    Metabolic Disorder Labs: Lab Results  Component Value Date   HGBA1C 6.3 (H) 01/30/2017   MPG 134.11 01/30/2017   MPG 128 05/11/2015   No results found for: PROLACTIN Lab Results  Component Value Date   CHOL 157 01/29/2017   TRIG 119 01/29/2017   HDL 40 (L) 01/29/2017   CHOLHDL 3.9 01/29/2017   VLDL 24 01/29/2017   LDLCALC 93 01/29/2017   LDLCALC 97 05/11/2015    Physical Findings: AIMS: Facial and Oral Movements Muscles of Facial Expression: None, normal Lips and Perioral Area: None, normal Jaw: None, normal Tongue: None, normal,Extremity Movements Upper (arms, wrists, hands, fingers): None, normal Lower (legs, knees, ankles, toes): None, normal, Trunk Movements Neck, shoulders, hips: None, normal, Overall Severity Severity of abnormal movements (highest score from questions above): None, normal Incapacitation due to abnormal movements: None, normal Patient's awareness of abnormal movements (rate only patient's report): No Awareness, Dental Status Current problems with teeth and/or dentures?: No Does patient usually wear dentures?: No  CIWA:    COWS:     Musculoskeletal: Strength & Muscle Tone: within normal limits Gait & Station: normal Patient leans: N/A  Psychiatric Specialty Exam: Physical Exam  Nursing note and vitals reviewed. Constitutional: She is oriented to person, place, and time. She appears well-developed and well-nourished.  Cardiovascular: Normal rate.  Respiratory: Effort normal.  Neurological: She is alert and oriented to person, place, and time.     Review of Systems  Constitutional: Negative.   Psychiatric/Behavioral: Positive for hallucinations. Negative for depression, substance abuse and suicidal ideas. The patient is not nervous/anxious and does not have insomnia.     Blood pressure (!) 142/46, pulse 67, temperature 97.9 F (36.6 C), temperature source Oral, resp. rate 16, height 5\' 1"  (1.549 m), SpO2 97 %.Body mass index is 44.15 kg/m.  General Appearance: Disheveled  Eye Contact:  Minimal  Speech:  Slow  Volume:  Decreased  Mood:  Euthymic  Affect:  Flat  Thought Process:  Disorganized  Orientation:  Full (Time, Place, and Person)  Thought Content:  Tangential / aud halluc possible  Suicidal Thoughts:  No  Homicidal Thoughts:  No  Memory:  Immediate;   Fair Recent;   Fair  Judgement:  Intact  Insight:  Lacking  Psychomotor Activity:  Decreased  Concentration:  Concentration: Fair and Attention Span: Fair  Recall:  [employer] of Knowledge:  Fair  Language:  Fair  Akathisia:  No  Handed:  Right  AIMS (if indicated):     Assets:  Financial Resources/Insurance Leisure Time Resilience  ADL's:  Intact  Cognition:  WNL  Sleep:  Number of Hours: 3.75     Treatment Plan Summary: Daily contact with patient to assess and evaluate symptoms and progress in treatment and Medication management   #Schizoaffective  Disorder/Bipolar type Ayani appears to still be experiencing auditory hallucinations and internal stimuli. She is not quite at baseline at the moment. We will attempt to stabilize her and prepare for discharge this week.   1. Continue inpatient hospitalization. Check Tegretol level, CBC diff, and LFTs. 2. Continue Tegretol 200 mg QAM, 400 mg PO QHS for mood  3. Try risperdal/cogentin  4. Continue Haldol/Ativan PRN psychosis 5. Continue Restoril to 45 mg PO QHS for insomnia 6. Continue trazodone 50 mg PO QHS PRN insomnia 7. Patient will participate in the therapeutic group milieu.     Tereso Newcomer, Medical Student 11/13/2018, 9:00 AM

## 2018-11-14 MED ORDER — BREXPIPRAZOLE 1 MG PO TABS
2.0000 mg | ORAL_TABLET | Freq: Two times a day (BID) | ORAL | Status: DC
  Administered 2018-11-14 – 2018-11-16 (×5): 2 mg via ORAL
  Filled 2018-11-14 (×7): qty 2

## 2018-11-14 NOTE — Progress Notes (Signed)
[place] NOVEL CORONAVIRUS (COVID-19) DAILY CHECK-OFF SYMPTOMS - answer yes or no to each - every day NO YES  Have you had a fever in the past 24 hours?  . Fever (Temp > 37.80C / 100F) X   Have you had any of these symptoms in the past 24 hours? . New Cough .  Sore Throat  .  Shortness of Breath .  Difficulty Breathing .  Unexplained Body Aches   X   Have you had any one of these symptoms in the past 24 hours not related to allergies?   . Runny Nose .  Nasal Congestion .  Sneezing   X   If you have had runny nose, nasal congestion, sneezing in the past 24 hours, has it worsened?  X   EXPOSURES - check yes or no X   Have you traveled outside the state in the past 14 days?  X   Have you been in contact with someone with a confirmed diagnosis of COVID-19 or PUI in the past 14 days without wearing appropriate PPE?  X   Have you been living in the same home as a person with confirmed diagnosis of COVID-19 or a PUI (household contact)?    X   Have you been diagnosed with COVID-19?    X              What to do next: Answered NO to all: Answered YES to anything:   Proceed with unit schedule Follow the [facility] Inpatient Flowsheet.   

## 2018-11-14 NOTE — Progress Notes (Signed)
Recreation Therapy Notes  Date: 7.28.20 Time: 1000 Location: 500 Hall Dayroom  Group Topic: Triggers  Goal Area(s) Addresses:  Patient will identify three biggest triggers.   Patient will identify how to avoid triggers. Patient will identify how to face triggers head on.  Behavioral Response: Engaged  Intervention: Worksheet  Activity: Triggers.  Patients were to identify what triggers certain behaviors in them.  Patients would then identify how they can avoid dealing with their triggers.  Patient would also explain how they could face triggers head on when they can't be avoided.  Education: Triggers, Discharge Planning   Education Outcome: Acknowledges education/In group clarification offered/Needs additional education.   Clinical Observations/Feedback: Pt expressed her triggers were stress, anger and depression.  Pt expressed dealing with triggers by avoiding people, changing mood and being passive aggressive.  Pt also expressed how her upstairs neighbors, appliances in her apartment and some family members cause stress for her.    [name], LRT/CTRS    Ria Comment, Craige Patel A 11/14/2018 10:49 AM

## 2018-11-14 NOTE — Progress Notes (Signed)
[facility] MD Progress Note  11/14/2018 8:52 AM [name]  MRN:  [id number]  [name] is a 48 year old woman with a history of schizoaffective disorder/bipolar type on hospital day seven for medication noncompliance and disorganized behavior.   Subjective: [name] is seen in the day room and says she again slept well. She says she is still hearing voices that talk of family matters but she is "not too worried about that". She expresses concern about her living situation, including high cost and cleanliness, and asked if her ACT team could find a way to more affordable and "less stressful" housing. She is adamant that she be put back on the Rexulti just on a higher dose when she returns home, as that is the only medicine she feels has worked for her in the past. She admits that she is here because she had not been taking her medication as prescribed. She denies visual hallucinations, SI, and HI at this time. She denies recent substance use.   Principal Problem: BPAD Diagnosis: Active Problems:   Schizoaffective disorder ([place])  Total Time spent with patient: 15 minutes  Past Psychiatric History: Pt is well known. See admission H&P  Past Medical History:  Past Medical History:  Diagnosis Date  . Arthritis   . Bipolar 1 disorder ([place])   . Bronchitis   . Depression   . Hemorrhoid   . Schizoaffective disorder ([place])   . Schizophrenia ([place])    Pt denies and reports it is schizoaffective disorder.   . Transfusion history     Past Surgical History:  Procedure Laterality Date  . CESAREAN SECTION     Family History:  Family History  Problem Relation Age of Onset  . Diabetes type II Other    Family Psychiatric  History: See admission H&P Social History:  Social History   Substance and Sexual Activity  Alcohol Use No     Social History   Substance and Sexual Activity  Drug Use No  . Types: Marijuana   Comment: Former user    Social History   Socioeconomic History  . Marital  status: Legally Separated    Spouse name: Not on file  . Number of children: Not on file  . Years of education: Not on file  . Highest education level: Not on file  Occupational History  . Not on file  Social Needs  . Financial resource strain: Not on file  . Food insecurity    Worry: Not on file    Inability: Not on file  . Transportation needs    Medical: Not on file    Non-medical: Not on file  Tobacco Use  . Smoking status: Current Every Day Smoker    Packs/day: 0.50    Years: 17.00    Pack years: 8.50    Types: Cigarettes  . Smokeless tobacco: Never Used  Substance and Sexual Activity  . Alcohol use: No  . Drug use: No    Types: Marijuana    Comment: Former user  . Sexual activity: Never  Lifestyle  . Physical activity    Days per week: Not on file    Minutes per session: Not on file  . Stress: Not on file  Relationships  . Social Herbalist on phone: Not on file    Gets together: Not on file    Attends religious service: Not on file    Active member of club or organization: Not on file    Attends  meetings of clubs or organizations: Not on file    Relationship status: Not on file  Other Topics Concern  . Not on file  Social History Narrative   ** Merged History Encounter **       Additional Social History:     Ms. Salsbury lives in an apartment in [place]. She has an ACT team.   EtOH: 1-2 drinks per night Illicits: denies                     Sleep: Good  Appetite:  Good  Current Medications: Current Facility-Administered Medications  Medication Dose Route Frequency Provider Last Rate Last Dose  . alum & mag hydroxide-simeth (MAALOX/MYLANTA) 200-200-20 MG/5ML suspension 30 mL  30 mL Oral Q4H PRN [name], NP      . benztropine (COGENTIN) tablet 1 mg  1 mg Oral BID [name], MD   1 mg at 11/14/18 0811  . carbamazepine (TEGRETOL) chewable tablet 200 mg  200 mg Oral Q1200 [name], MD   200 mg at 11/13/18 0802  .  carbamazepine (TEGRETOL) chewable tablet 400 mg  400 mg Oral QHS [name], MD   400 mg at 11/13/18 2108  . haloperidol (HALDOL) tablet 5 mg  5 mg Oral Q6H PRN [name], MD   5 mg at 11/11/18 1647  . haloperidol lactate (HALDOL) injection 5 mg  5 mg Intramuscular Q6H PRN [name], MD      . LORazepam (ATIVAN) tablet 2 mg  2 mg Oral Q6H PRN [name], MD   2 mg at 11/11/18 1647   Or  . LORazepam (ATIVAN) injection 2 mg  2 mg Intramuscular Q6H PRN [name], MD      . magnesium hydroxide (MILK OF MAGNESIA) suspension 30 mL  30 mL Oral Daily PRN [name], NP      . nicotine polacrilex (NICORETTE) gum 2 mg  2 mg Oral PRN [name], MD      . risperiDONE (RISPERDAL) tablet 3 mg  3 mg Oral BID [name], MD   3 mg at 11/14/18 0086  . temazepam (RESTORIL) capsule 30 mg  30 mg Oral QHS [name], NP   30 mg at 11/13/18 2108  . traZODone (DESYREL) tablet 50 mg  50 mg Oral QHS,MR X 1 [name], NP   50 mg at 11/13/18 2108    Lab Results:  Results for orders placed or performed during the hospital encounter of 11/08/18 (from the past 48 hour(s))  Carbamazepine level, total     Status: Abnormal   Collection Time: 11/13/18  6:35 AM  Result Value Ref Range   Carbamazepine Lvl 12.8 (H) 4.0 - 12.0 ug/mL    Comment: Performed at [facility], [address], [place], [place] [address]  CBC with Differential/Platelet     Status: Abnormal   Collection Time: 11/13/18  6:35 AM  Result Value Ref Range   WBC 9.6 4.0 - 10.5 K/uL   RBC 3.63 (L) 3.87 - 5.11 MIL/uL   Hemoglobin 10.0 (L) 12.0 - 15.0 g/dL   HCT 32.3 (L) 36.0 - 46.0 %   MCV 89.0 80.0 - 100.0 fL   MCH 27.5 26.0 - 34.0 pg   MCHC 31.0 30.0 - 36.0 g/dL   RDW 17.6 (H) 11.5 - 15.5 %   Platelets 286 150 - 400 K/uL   nRBC 0.0 0.0 - 0.2 %   Neutrophils Relative % 55 %   Neutro  Abs 5.3 1.7 - 7.7 K/uL   Lymphocytes Relative 38 %   Lymphs Abs 3.6 0.7 - 4.0 K/uL   Monocytes Relative 5 %   Monocytes Absolute  0.5 0.1 - 1.0 K/uL   Eosinophils Relative 2 %   Eosinophils Absolute 0.2 0.0 - 0.5 K/uL   Basophils Relative 0 %   Basophils Absolute 0.0 0.0 - 0.1 K/uL   Immature Granulocytes 0 %   Abs Immature Granulocytes 0.02 0.00 - 0.07 K/uL    Comment: Performed at [facility], [place] [address], [place], [place] [address]  Hepatic function panel     Status: Abnormal   Collection Time: 11/13/18  6:35 AM  Result Value Ref Range   Total Protein 7.3 6.5 - 8.1 g/dL   Albumin 3.6 3.5 - 5.0 g/dL   AST 13 (L) 15 - 41 U/L   ALT 12 0 - 44 U/L   Alkaline Phosphatase 79 38 - 126 U/L   Total Bilirubin 0.2 (L) 0.3 - 1.2 mg/dL   Bilirubin, Direct <0.1 0.0 - 0.2 mg/dL   Indirect Bilirubin NOT CALCULATED 0.3 - 0.9 mg/dL    Comment: Performed at [facility], [place] [address] [place],  [address]    Blood Alcohol level:  Lab Results  Component Value Date   ETH <10 11/11/2017   ETH <10 09/81/1914    Metabolic Disorder Labs: Lab Results  Component Value Date   HGBA1C 6.3 (H) 01/30/2017   MPG 134.11 01/30/2017   MPG 128 05/11/2015   No results found for: PROLACTIN Lab Results  Component Value Date   CHOL 157 01/29/2017   TRIG 119 01/29/2017   HDL 40 (L) 01/29/2017   CHOLHDL 3.9 01/29/2017   VLDL 24 01/29/2017   LDLCALC 93 01/29/2017   LDLCALC 97 05/11/2015    Physical Findings: AIMS: Facial and Oral Movements Muscles of Facial Expression: None, normal Lips and Perioral Area: None, normal Jaw: None, normal Tongue: None, normal,Extremity Movements Upper (arms, wrists, hands, fingers): None, normal Lower (legs, knees, ankles, toes): None, normal, Trunk Movements Neck, shoulders, hips: None, normal, Overall Severity Severity of abnormal movements (highest score from questions above): None, normal Incapacitation due to abnormal movements: None, normal Patient's awareness of abnormal movements (rate only patient's report): No Awareness, Dental  Status Current problems with teeth and/or dentures?: No Does patient usually wear dentures?: No  CIWA:    COWS:     Musculoskeletal: Strength & Muscle Tone: within normal limits Gait & Station: normal Patient leans: N/A  Psychiatric Specialty Exam: Physical Exam  Nursing note and vitals reviewed. Constitutional: She is oriented to person, place, and time. She appears well-developed and well-nourished.  Cardiovascular: Normal rate.  Respiratory: Effort normal.  Neurological: She is alert and oriented to person, place, and time.    Review of Systems  Constitutional: Negative.   Psychiatric/Behavioral: Positive for hallucinations. Negative for depression, substance abuse and suicidal ideas. The patient is not nervous/anxious and does not have insomnia.     Blood pressure (!) 142/46, pulse 67, temperature 97.9 F (36.6 C), temperature source Oral, resp. rate 16, height 5\' 1"  (1.549 m), SpO2 97 %.Body mass index is 44.15 kg/m.  General Appearance: Disheveled  Eye Contact:  Minimal  Speech:  Slow  Volume:  Decreased  Mood:  Euthymic  Affect:  Flat  Thought Process:  Disorganized  Orientation:  Full (Time, Place, and Person)  Thought Content:  Tangential / aud halluc   Suicidal Thoughts:  No  Homicidal Thoughts:  No  Memory:  Immediate;   Fair Recent;   Fair  Judgement:  Intact  Insight:  Lacking  Psychomotor Activity:  Decreased  Concentration:  Concentration: Fair and Attention Span: Fair  Recall:  [employer] of Knowledge:  Fair  Language:  Fair  Akathisia:  No  Handed:  Right  AIMS (if indicated):     Assets:  Financial Resources/Insurance Leisure Time Resilience  ADL's:  Intact  Cognition:  WNL  Sleep:  Number of Hours: 6.75     Treatment Plan Summary: Daily contact with patient to assess and evaluate symptoms and progress in treatment and Medication management   #Schizoaffective Disorder/Bipolar type [name] appears much better today. She is still  experiencing mild auditory hallucinations and internal stimuli that are not concerning to her. She is not quite at baseline at the moment. She has a history of EPS with risperdal, so we have added benztropine and she appears to be doing well at this time. We will attempt to stabilize her and prepare a home medication regimen for discharge.  1. Continue inpatient hospitalization. Check Tegretol level, CBC diff, and LFTs. 2. Continue Tegretol 200 mg QAM, 400 mg PO QHS for mood  3. Resume rexulti 2 mg po bid for psychosis / continue Benztropine for EPS prophy 4. Discontinue Haldol/Ativan PRN psychosis 5. Continue Restoril to 45 mg PO QHS for insomnia 6. Continue trazodone 50 mg PO QHS PRN insomnia 7. Patient will participate in the therapeutic group milieu.     Tereso Newcomer, Medical Student 11/14/2018, 8:52 AM

## 2018-11-14 NOTE — Progress Notes (Signed)
Patient did not attend wrap up group. 

## 2018-11-14 NOTE — B[facility] Counselor (Signed)
CSW attempted to meet with patient for a third time to complete assessment. Patient was observed walking down the hall seconds before CSW approached patient at bedside, but patient appeared to be sleeping deeply. She did not respond to [facility] calling her name or knocking at the door.  Patient is established with ACTT services.  Stephanie Acre, LCSW-A Clinical Social Worker

## 2018-11-14 NOTE — Progress Notes (Signed)
Patient ID: [name], female   DOB: 05-Jan-1971, 48 y.o.   MRN: [id number]  Nursing Progress Note [id number]  Patient presents with animated affect and is more engaged with staff/peers. Patient still laughing inappropriately in her room and endorses she is hearing voices. Patient compliant with scheduled medications. Patient is seen visible in the milieu.  Patient safety maintained with q15 min safety checks.   Patient remains safe on the unit at this time. Will continue to support and monitor with plan of care.   Patient's self-inventory sheet Rated Energy Level  Normal  Rated Sleep  Good  Rated Appetite  Good  Rated Anxiety (0-10)  0  Rated Hopelessness (0-10)  0  Rated Depression (0-10)  0  Daily Goal  N/A  Any Additional Comments:

## 2018-11-15 MED ORDER — NAPROXEN 500 MG PO TABS
500.0000 mg | ORAL_TABLET | Freq: Two times a day (BID) | ORAL | Status: DC
  Administered 2018-11-15 – 2018-11-17 (×5): 500 mg via ORAL
  Filled 2018-11-15 (×10): qty 1

## 2018-11-15 MED ORDER — CARBAMAZEPINE 100 MG PO CHEW
100.0000 mg | CHEWABLE_TABLET | Freq: Every day | ORAL | Status: DC
  Administered 2018-11-15 – 2018-11-16 (×2): 100 mg via ORAL
  Filled 2018-11-15 (×4): qty 1

## 2018-11-15 NOTE — Progress Notes (Signed)
[facility] MD Progress Note  11/15/2018 8:47 AM [name]  MRN:  [id number]  [name] is a 48 year old woman with a history of schizoaffective disorder/bipolar type on [name] day eight for medication noncompliance and disorganized behavior.   Subjective: [name] is seen in her room and says she slept well. She stopped taking her trazodone yesterday because it makes her feel "groggy and out of it", and mentions that she feels much improved since restarting Rexulti. She would like to continue the Rexulti on discharge as she had a tremor with Risperdal in her past. She denies any restlessness, cramping, or involuntary movements at this time. She says her auditory hallucinations have ceased since yesterday morning. She insinuated that drug use may have affected her illness in recent history, but she overtly denies use. She verbalized understanding that her medication noncompliance contributed to her decompensation over the past few months and her current hospitalization. She denies a/v hallucinations, SI, and HI at this time.   Objective: Tegretol level was elevated on 7/27. Two BP readings were high, up to 035D systolic on 9/74. Most recent pressure was wnl.    Principal Problem: BPAD Diagnosis: Active Problems:   Schizoaffective disorder ([place])  Total Time spent with patient: 15 minutes  Past Psychiatric History: Pt is well known. See admission H&P  Past Medical History:  Past Medical History:  Diagnosis Date  . Arthritis   . Bipolar 1 disorder ([place])   . Bronchitis   . Depression   . Hemorrhoid   . Schizoaffective disorder ([place])   . Schizophrenia ([place])    Pt denies and reports it is schizoaffective disorder.   . Transfusion history     Past Surgical History:  Procedure Laterality Date  . CESAREAN SECTION     Family History:  Family History  Problem Relation Age of Onset  . Diabetes type II Other    Family Psychiatric  History: See admission H&P Social History:  Social  History   Substance and Sexual Activity  Alcohol Use No     Social History   Substance and Sexual Activity  Drug Use No  . Types: Marijuana   Comment: Former user    Social History   Socioeconomic History  . Marital status: Legally Separated    Spouse name: Not on file  . Number of children: Not on file  . Years of education: Not on file  . Highest education level: Not on file  Occupational History  . Not on file  Social Needs  . Financial resource strain: Not on file  . Food insecurity    Worry: Not on file    Inability: Not on file  . Transportation needs    Medical: Not on file    Non-medical: Not on file  Tobacco Use  . Smoking status: Current Every Day Smoker    Packs/day: 0.50    Years: 17.00    Pack years: 8.50    Types: Cigarettes  . Smokeless tobacco: Never Used  Substance and Sexual Activity  . Alcohol use: No  . Drug use: No    Types: Marijuana    Comment: Former user  . Sexual activity: Never  Lifestyle  . Physical activity    Days per week: Not on file    Minutes per session: Not on file  . Stress: Not on file  Relationships  . Social Herbalist on phone: Not on file    Gets together: Not on file  Attends religious service: Not on file    Active member of club or organization: Not on file    Attends meetings of clubs or organizations: Not on file    Relationship status: Not on file  Other Topics Concern  . Not on file  Social History Narrative   ** Merged History Encounter **       Additional Social History:     Ms. Garron lives in an apartment in [place]. 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   EtOH: 1-2 drinks per night Illicits: denies. Unknown                     Sleep: Good  Appetite:  Good  Current Medications: Current Facility-Administered Medications  Medication Dose Route Frequency Provider Last Rate Last Dose  . alum & mag hydroxide-simeth (MAALOX/MYLANTA) 200-200-20 MG/5ML suspension 30 mL  30 mL Oral Q4H PRN  [name], NP      . benztropine (COGENTIN) tablet 1 mg  1 mg Oral BID [name], MD   1 mg at 11/15/18 [phone]  . Brexpiprazole TABS 2 mg  2 mg Oral BID [name], MD   2 mg at 11/15/18 [phone]  . carbamazepine (TEGRETOL) chewable tablet 200 mg  200 mg Oral Q1200 [name], MD   200 mg at 11/14/18 1122  . carbamazepine (TEGRETOL) chewable tablet 400 mg  400 mg Oral QHS [name], MD   400 mg at 11/14/18 2106  . haloperidol (HALDOL) tablet 5 mg  5 mg Oral Q6H PRN [name], MD   5 mg at 11/11/18 1647  . haloperidol lactate (HALDOL) injection 5 mg  5 mg Intramuscular Q6H PRN [name], MD      . LORazepam (ATIVAN) tablet 2 mg  2 mg Oral Q6H PRN [name], MD   2 mg at 11/11/18 1647   Or  . LORazepam (ATIVAN) injection 2 mg  2 mg Intramuscular Q6H PRN [name], MD      . magnesium hydroxide (MILK OF MAGNESIA) suspension 30 mL  30 mL Oral Daily PRN [name], NP      . naproxen (NAPROSYN) tablet 500 mg  500 mg Oral BID WC [name], NP   500 mg at 11/15/18 1601  . nicotine polacrilex (NICORETTE) gum 2 mg  2 mg Oral PRN [name], MD   2 mg at 11/14/18 1146  . temazepam (RESTORIL) capsule 30 mg  30 mg Oral QHS [name], NP   30 mg at 11/14/18 2106  . traZODone (DESYREL) tablet 50 mg  50 mg Oral QHS,MR X 1 [name], NP   50 mg at 11/13/18 2108    Lab Results:  No results found for this or any previous visit (from the past 48 hour(s)).  Blood Alcohol level:  Lab Results  Component Value Date   ETH <10 11/11/2017   ETH <10 09/32/3557    Metabolic Disorder Labs: Lab Results  Component Value Date   HGBA1C 6.3 (H) 01/30/2017   MPG 134.11 01/30/2017   MPG 128 05/11/2015   No results found for: PROLACTIN Lab Results  Component Value Date   CHOL 157 01/29/2017   TRIG 119 01/29/2017   HDL 40 (L) 01/29/2017   CHOLHDL 3.9 01/29/2017   VLDL 24 01/29/2017   LDLCALC 93 01/29/2017   LDLCALC 97 05/11/2015    Physical Findings: AIMS:  Facial and Oral Movements Muscles of Facial Expression: None, normal Lips and Perioral Area: None, normal  Jaw: None, normal Tongue: None, normal,Extremity Movements Upper (arms, wrists, hands, fingers): None, normal Lower (legs, knees, ankles, toes): None, normal, Trunk Movements Neck, shoulders, hips: None, normal, Overall Severity Severity of abnormal movements (highest score from questions above): None, normal Incapacitation due to abnormal movements: None, normal Patient's awareness of abnormal movements (rate only patient's report): No Awareness, Dental Status Current problems with teeth and/or dentures?: No Does patient usually wear dentures?: No  CIWA:    COWS:     Musculoskeletal: Strength & Muscle Tone: within normal limits Gait & Station: normal Patient leans: N/A  Psychiatric Specialty Exam: Physical Exam  Nursing note and vitals reviewed. Constitutional: She is oriented to person, place, and time. She appears well-developed and well-nourished.  Cardiovascular: Normal rate.  Respiratory: Effort normal.  Neurological: She is alert and oriented to person, place, and time.    Psych/Behavioral: Denies A/v hallucinations, SI, HI  Blood pressure 139/76, pulse 75, temperature 98.2 F (36.8 C), temperature source Oral, resp. rate 16, height 5\' 1"  (1.549 m), SpO2 97 %.Body mass index is 44.15 kg/m.  General Appearance: Disheveled  Eye Contact:  Minimal  Speech:  Slow  Volume:  Decreased  Mood:  Euthymic  Affect:  Flat  Thought Process: more organized  Orientation:  Full (Time, Place, and Person)  Thought Content:  Tangential - loose associations  Suicidal Thoughts:  No  Homicidal Thoughts:  No  Memory:  Immediate;   Fair Recent;   Fair  Judgement:  Intact  Insight:  improved  Psychomotor Activity:  Decreased  Concentration:  Concentration: Fair and Attention Span: Fair  Recall:  [employer] of Knowledge:  Fair  Language:  Fair  Akathisia:  No  Handed:  Right   AIMS (if indicated):   0  Assets:  Financial Resources/Insurance Leisure Time Resilience  ADL's:  Intact  Cognition:  WNL  Sleep:  Number of Hours: 6.25     Treatment Plan Summary: Daily contact with patient to assess and evaluate symptoms and progress in treatment and Medication management   #Schizoaffective Disorder/Bipolar type [name] again appears well today, close to baseline. She denies auditory hallucinations and internal stimuli, and says she is feeling much better since starting Rexulti yesterday. She slept well despite discontinuing Trazodone. Her BP was elevated for a few readings, up to 160s, but has since stabilized; her tegretol level was also modestly elevated. We will continue to monitor and prepare a home medication regimen for discharge.  1. Continue inpatient hospitalization.  May Recheck Tegretol level, CBC diff, and LFTs. Continue monitor vitals per floor protocol  2. Lower Tegretol by 100 mg QAM, cont 400 mg PO QHS for mood  3. Continue rexulti 2 mg po bid for psychosis / continue Benztropine for EPS prophy 4. Continue Restoril to 45 mg PO QHS for insomnia 6. Discontinue trazodone 50 mg PO QHS PRN insomnia 7. Patient will participate in the therapeutic group milieu.     Tereso Newcomer, Medical Student 11/15/2018, 8:47 AM

## 2018-11-15 NOTE — [facility] Group Notes (Signed)
Occupational Theapy Group Note  Date:  11/15/2018 Time:  8:20 PM  Group Topic/Focus:  Leisure  Participation Level:  Active  Participation Quality:  Attentive  Affect:  Labile  Cognitive:  Alert  Insight: Lacking  Engagement in Group:  Engaged  Modes of Intervention:  Activity, Discussion, Education and Socialization  Additional Comments:    S: I didn't even want to play and now I am laughing  O: OT group focus on leisure this date, while incorporating coping skills to ensure understanding. Pts to play game of Uno: and name a struggle they're facing, a communication skill, something they like about yourself, stress management, and a healthy way to manage anger per certain cards played. Pt also assessed for attention, ability to follow rules, and temperament with rules.   A: Pt presents with labile affect, very flat to then hysterically laughing throughout session. She states she enjoyed the activity. She needed cues for attention to consistently follow the game this date. She mostly interacted well with peers, except at one point she did not follow the rules correctly, was corrected by another patient and she put her cards down and left the game. She did return to watch, but did not continue to play.   P: OT group will be x1 per week while pt inpatient.   Zenovia Jarred, MSOT, OTR/L Behavioral Health OT/ Acute Relief OT PHP Office: [place] 11/15/2018, 8:20 PM

## 2018-11-15 NOTE — Progress Notes (Signed)
Patient ID: [name], female   DOB: 04/10/1971, 48 y.o.   MRN: [id number] D: Patient with mood depressed, affect blunted, pt reported her sleep quality last night as good, reported appetite in the last 24 hrs as good, reported energy level as low, rates depression as 0 (10 being the worst), rates hopelessness level as 0 (10 being the worst), and rates anxiety level today as 3(10 being the worst). Patient denies SI/HI/AVH.  A: Patient is being given all of his medications as scheduled, and is being maintained on Q15 minute checks for safety.   R: Pt is being maintained on Q15 minute checks, and all concerns will be addressed as they arises.  Will continue to monitor.

## 2018-11-15 NOTE — Progress Notes (Signed)
D: Pt denies SI/HI/AVH. Pt is pleasant and cooperative. Pt labile this evening . Pt has been in dayroom for a little, but stayed in her room much of the night.   A: Pt was offered support and encouragement. Pt was given scheduled medications. Pt was encourage to attend groups. Q 15 minute checks were done for safety.   R:Pt attends groups and interacts well with peers and staff. Pt is taking medication. Pt has no complaints.Pt receptive to treatment and safety maintained on unit.

## 2018-11-15 NOTE — Progress Notes (Signed)
Recreation Therapy Notes  Date: 7.29.20 Time: 1000 Location: 500 Hall Day Room  Group Topic: Communication, Team Building, Problem Solving  Goal Area(s) Addresses:  Patient will effectively work with peer towards shared goal.  Patient will identify skill used to make activity successful.  Patient will identify how skills used during activity can be used to reach post d/c goals.   Intervention: STEM Activity   Activity: Straw Bridge.  In groups, patients were given 15 plastic straws and about 2 feet of masking tape.  Patients were to use the supplies provided to them to construct an elevated bridge that could hold a small puzzle box.  Education: Education officer, community, Dentist.   Education Outcome: Acknowledges education  Clinical Observations/Feedback:  Pt did not attend group.     [name], LRT/CTRS         Ria Comment, Makaylynn Bonillas A 11/15/2018 11:15 AM

## 2018-11-15 NOTE — Tx Team (Signed)
Interdisciplinary Treatment and Diagnostic Plan Update  11/15/2018 Time of Session: 09:02am [name] MRN: [id number]  Principal Diagnosis: <principal problem not specified>  Secondary Diagnoses: Active Problems:   Schizoaffective disorder ([facility])   Current Medications:  Current Facility-Administered Medications  Medication Dose Route Frequency Provider Last Rate Last Dose  . alum & mag hydroxide-simeth (MAALOX/MYLANTA) 200-200-20 MG/5ML suspension 30 mL  30 mL Oral Q4H PRN [name], NP      . benztropine (COGENTIN) tablet 1 mg  1 mg Oral BID [name], MD   1 mg at 11/15/18 [id number]  . Brexpiprazole TABS 2 mg  2 mg Oral BID [name], MD   2 mg at 11/15/18 [phone]  . carbamazepine (TEGRETOL) chewable tablet 100 mg  100 mg Oral Q1200 [name], MD      . carbamazepine (TEGRETOL) chewable tablet 400 mg  400 mg Oral QHS [name], MD   400 mg at 11/14/18 2106  . haloperidol (HALDOL) tablet 5 mg  5 mg Oral Q6H PRN [name], MD   5 mg at 11/11/18 1647  . haloperidol lactate (HALDOL) injection 5 mg  5 mg Intramuscular Q6H PRN [name], MD      . LORazepam (ATIVAN) tablet 2 mg  2 mg Oral Q6H PRN [name], MD   2 mg at 11/11/18 1647   Or  . LORazepam (ATIVAN) injection 2 mg  2 mg Intramuscular Q6H PRN [name], MD      . magnesium hydroxide (MILK OF MAGNESIA) suspension 30 mL  30 mL Oral Daily PRN [name], NP      . naproxen (NAPROSYN) tablet 500 mg  500 mg Oral BID WC [name], NP   500 mg at 11/15/18 6503  . nicotine polacrilex (NICORETTE) gum 2 mg  2 mg Oral PRN [name], MD   2 mg at 11/14/18 1146  . temazepam (RESTORIL) capsule 30 mg  30 mg Oral QHS [name], NP   30 mg at 11/14/18 2106  . traZODone (DESYREL) tablet 50 mg  50 mg Oral QHS,MR X 1 [name], NP   50 mg at 11/13/18 2108   PTA Medications: Medications Prior to Admission  Medication Sig Dispense Refill Last Dose  . amantadine (SYMMETREL) 100 MG capsule Take 1  capsule (100 mg total) by mouth 2 (two) times daily. (Patient not taking: Reported on 10/29/2018) 60 capsule 0 Not Taking at Unknown time  . haloperidol decanoate (HALDOL DECANOATE) 100 MG/ML injection Inject 0.5 mLs (50 mg total) into the muscle every 28 (twenty-eight) days. (Patient not taking: Reported on 10/29/2018) 1 mL 0 Not Taking at Unknown time  . traZODone (DESYREL) 100 MG tablet Take 1 tablet (100 mg total) by mouth at bedtime. (Patient not taking: Reported on 10/29/2018) 30 tablet 0 Not Taking at Unknown time    Patient Stressors: Medication change or noncompliance  Patient Strengths: Ability for insight Capable of independent living General fund of knowledge  Treatment Modalities: Medication Management, Group therapy, Case management,  1 to 1 session with clinician, Psychoeducation, Recreational therapy.   Physician Treatment Plan for Primary Diagnosis: <principal problem not specified> Long Term Goal(s): Improvement in symptoms so as ready for discharge Improvement in symptoms so as ready for discharge   Short Term Goals: Ability to demonstrate self-control will improve Ability to identify and develop effective coping behaviors will improve Ability to maintain clinical measurements within normal limits will improve Compliance with prescribed medications will improve Ability to demonstrate  self-control will improve Ability to identify and develop effective coping behaviors will improve Ability to maintain clinical measurements within normal limits will improve Compliance with prescribed medications will improve  Medication Management: Evaluate patient's response, side effects, and tolerance of medication regimen.  Therapeutic Interventions: 1 to 1 sessions, Unit Group sessions and Medication administration.  Evaluation of Outcomes: Progressing  Physician Treatment Plan for Secondary Diagnosis: Active Problems:   Schizoaffective disorder ([place])  Long Term Goal(s):  Improvement in symptoms so as ready for discharge Improvement in symptoms so as ready for discharge   Short Term Goals: Ability to demonstrate self-control will improve Ability to identify and develop effective coping behaviors will improve Ability to maintain clinical measurements within normal limits will improve Compliance with prescribed medications will improve Ability to demonstrate self-control will improve Ability to identify and develop effective coping behaviors will improve Ability to maintain clinical measurements within normal limits will improve Compliance with prescribed medications will improve     Medication Management: Evaluate patient's response, side effects, and tolerance of medication regimen.  Therapeutic Interventions: 1 to 1 sessions, Unit Group sessions and Medication administration.  Evaluation of Outcomes: Progressing   RN Treatment Plan for Primary Diagnosis: <principal problem not specified> Long Term Goal(s): Knowledge of disease and therapeutic regimen to maintain health will improve  Short Term Goals: Ability to participate in decision making will improve, Ability to verbalize feelings will improve, Ability to disclose and discuss suicidal ideas, Ability to identify and develop effective coping behaviors will improve and Compliance with prescribed medications will improve  Medication Management: RN will administer medications as ordered by provider, will assess and evaluate patient's response and provide education to patient for prescribed medication. RN will report any adverse and/or side effects to prescribing provider.  Therapeutic Interventions: 1 on 1 counseling sessions, Psychoeducation, Medication administration, Evaluate responses to treatment, Monitor vital signs and CBGs as ordered, Perform/monitor CIWA, COWS, AIMS and Fall Risk screenings as ordered, Perform wound care treatments as ordered.  Evaluation of Outcomes: Progressing   LCSW  Treatment Plan for Primary Diagnosis: <principal problem not specified> Long Term Goal(s): Safe transition to appropriate next level of care at discharge, Engage patient in therapeutic group addressing interpersonal concerns.  Short Term Goals: Engage patient in aftercare planning with referrals and resources and Increase skills for wellness and recovery  Therapeutic Interventions: Assess for all discharge needs, 1 to 1 time with Social worker, Explore available resources and support systems, Assess for adequacy in community support network, Educate family and significant other(s) on suicide prevention, Complete Psychosocial Assessment, Interpersonal group therapy.  Evaluation of Outcomes: Progressing   Progress in Treatment: Attending groups: No. Participating in groups: No. Taking medication as prescribed: Yes. Toleration medication: Yes. Family/Significant other contact made: No, will contact:  will contact if given consent to contact Patient understands diagnosis: No. Discussing patient identified problems/goals with staff: Yes. Medical problems stabilized or resolved: Yes. Denies suicidal/homicidal ideation: Yes. Issues/concerns per patient self-inventory: No. Other:   New problem(s) identified: No, Describe:  None  New Short Term/Long Term Goal(s): Medication stabilization, elimination of SI thoughts, and development of a comprehensive mental wellness plan.   Patient Goals:    Discharge Plan or Barriers: CSW will continue to follow up for appropriate referrals and possible discharge planning  Reason for Continuation of Hospitalization: Hallucinations Medication stabilization  Estimated Length of Stay: 1-2 days  Attendees: Patient: 11/15/2018  Physician: Dr. [name], MD 11/15/2018   Nursing: Tamela Oddi, [place] 11/15/2018  RN Care Manager: 11/15/2018  Social Worker: [place], [place] 11/15/2018   Recreational Therapist:  11/15/2018   Other:  11/15/2018   Other:  11/15/2018    Other: 11/15/2018      Scribe for Treatment Team: Trecia Rogers, LCSW 11/15/2018 10:23 AM

## 2018-11-16 MED ORDER — RISPERIDONE 3 MG PO TABS
3.0000 mg | ORAL_TABLET | Freq: Two times a day (BID) | ORAL | Status: DC
  Administered 2018-11-16 – 2018-11-17 (×2): 3 mg via ORAL
  Filled 2018-11-16 (×7): qty 1

## 2018-11-16 NOTE — Progress Notes (Signed)
[facility] MD Progress Note  11/16/2018 9:00 AM [name]  MRN:  [id number]  [name] is a 48 year old woman with a history of schizoaffective disorder/bipolar type on hospital day nine for medication noncompliance and disorganized behavior.   Subjective: [name] is seen in her room. She reports not sleep well last night. She felt worse yesterday and endorses auditory hallucinations about "obama" and "family matters". She says "voices are in my brain" and she feels like "something is loose in her brain". She perseverates on the concept of letting go of children. She is preoccupied that her son may go to jail again and she will not be able to pay bail. She denies SI, HI, and visual hallucinations. She is very disorganized at this time.   Principal Problem: BPAD Diagnosis: Active Problems:   Schizoaffective disorder ([place])  Total Time spent with patient: 15 minutes  Past Psychiatric History: Pt is well known. See admission H&P  Past Medical History:  Past Medical History:  Diagnosis Date  . Arthritis   . Bipolar 1 disorder ([place])   . Bronchitis   . Depression   . Hemorrhoid   . Schizoaffective disorder ([place])   . Schizophrenia ([place])    Pt denies and reports it is schizoaffective disorder.   . Transfusion history     Past Surgical History:  Procedure Laterality Date  . CESAREAN SECTION     Family History:  Family History  Problem Relation Age of Onset  . Diabetes type II Other    Family Psychiatric  History: See admission H&P Social History:  Social History   Substance and Sexual Activity  Alcohol Use No     Social History   Substance and Sexual Activity  Drug Use No  . Types: Marijuana   Comment: Former user    Social History   Socioeconomic History  . Marital status: Legally Separated    Spouse name: Not on file  . Number of children: Not on file  . Years of education: Not on file  . Highest education level: Not on file  Occupational History  . Not on file   Social Needs  . Financial resource strain: Not on file  . Food insecurity    Worry: Not on file    Inability: Not on file  . Transportation needs    Medical: Not on file    Non-medical: Not on file  Tobacco Use  . Smoking status: Current Every Day Smoker    Packs/day: 0.50    Years: 17.00    Pack years: 8.50    Types: Cigarettes  . Smokeless tobacco: Never Used  Substance and Sexual Activity  . Alcohol use: No  . Drug use: No    Types: Marijuana    Comment: Former user  . Sexual activity: Never  Lifestyle  . Physical activity    Days per week: Not on file    Minutes per session: Not on file  . Stress: Not on file  Relationships  . Social Herbalist on phone: Not on file    Gets together: Not on file    Attends religious service: Not on file    Active member of club or organization: Not on file    Attends meetings of clubs or organizations: Not on file    Relationship status: Not on file  Other Topics Concern  . Not on file  Social History Narrative   ** Merged History Encounter **  Additional Social History:     [name] lives in an apartment in [place]. 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   EtOH: 1-2 drinks per night Illicits: denies. Unknown                     Sleep: Good  Appetite:  Good  Current Medications: Current Facility-Administered Medications  Medication Dose Route Frequency Provider Last Rate Last Dose  . alum & mag hydroxide-simeth (MAALOX/MYLANTA) 200-200-20 MG/5ML suspension 30 mL  30 mL Oral Q4H PRN [name], NP      . benztropine (COGENTIN) tablet 1 mg  1 mg Oral BID [name], MD   1 mg at 11/16/18 5885  . Brexpiprazole TABS 2 mg  2 mg Oral BID [name], MD   2 mg at 11/16/18 0277  . carbamazepine (TEGRETOL) chewable tablet 100 mg  100 mg Oral Q1200 [name], MD   100 mg at 11/16/18 0824  . carbamazepine (TEGRETOL) chewable tablet 400 mg  400 mg Oral QHS [name], MD   400 mg at 11/15/18 2124  .  haloperidol (HALDOL) tablet 5 mg  5 mg Oral Q6H PRN [name], MD   5 mg at 11/11/18 1647  . haloperidol lactate (HALDOL) injection 5 mg  5 mg Intramuscular Q6H PRN [name], MD      . LORazepam (ATIVAN) tablet 2 mg  2 mg Oral Q6H PRN [name], MD   2 mg at 11/16/18 0400   Or  . LORazepam (ATIVAN) injection 2 mg  2 mg Intramuscular Q6H PRN [name], MD      . magnesium hydroxide (MILK OF MAGNESIA) suspension 30 mL  30 mL Oral Daily PRN [name], NP      . naproxen (NAPROSYN) tablet 500 mg  500 mg Oral BID WC [name], NP   500 mg at 11/16/18 0824  . nicotine polacrilex (NICORETTE) gum 2 mg  2 mg Oral PRN [name], MD   2 mg at 11/15/18 1629  . temazepam (RESTORIL) capsule 30 mg  30 mg Oral QHS [name], NP   30 mg at 11/15/18 2123  . traZODone (DESYREL) tablet 50 mg  50 mg Oral QHS,MR X 1 [name], NP   50 mg at 11/13/18 2108    Lab Results:  No results found for this or any previous visit (from the past 48 hour(s)).  Blood Alcohol level:  Lab Results  Component Value Date   ETH <10 11/11/2017   ETH <10 41/28/7867    Metabolic Disorder Labs: Lab Results  Component Value Date   HGBA1C 6.3 (H) 01/30/2017   MPG 134.11 01/30/2017   MPG 128 05/11/2015   No results found for: PROLACTIN Lab Results  Component Value Date   CHOL 157 01/29/2017   TRIG 119 01/29/2017   HDL 40 (L) 01/29/2017   CHOLHDL 3.9 01/29/2017   VLDL 24 01/29/2017   LDLCALC 93 01/29/2017   LDLCALC 97 05/11/2015    Physical Findings: AIMS: Facial and Oral Movements Muscles of Facial Expression: None, normal Lips and Perioral Area: None, normal Jaw: None, normal Tongue: None, normal,Extremity Movements Upper (arms, wrists, hands, fingers): None, normal Lower (legs, knees, ankles, toes): None, normal, Trunk Movements Neck, shoulders, hips: None, normal, Overall Severity Severity of abnormal movements (highest score from questions above): None,  normal Incapacitation due to abnormal movements: None, normal Patient's awareness of abnormal movements (rate only patient's report): No Awareness, Dental Status Current problems with  teeth and/or dentures?: No Does patient usually wear dentures?: No  CIWA:    COWS:     Musculoskeletal: Strength & Muscle Tone: within normal limits Gait & Station: normal Patient leans: N/A  Psychiatric Specialty Exam: Physical Exam  Nursing note and vitals reviewed. Constitutional: She is oriented to person, place, and time. She appears well-developed and well-nourished.  Cardiovascular: Normal rate.  Respiratory: Effort normal.  Neurological: She is alert and oriented to person, place, and time.    Psych/Behavioral: Denies A/v hallucinations, SI, HI  Blood pressure 139/76, pulse 75, temperature 98.2 F (36.8 C), temperature source Oral, resp. rate 16, height 5\' 1"  (1.549 m), SpO2 97 %.Body mass index is 44.15 kg/m.  General Appearance: Disheveled  Eye Contact:  Minimal  Speech:  Slow  Volume:  Decreased  Mood:  Euthymic  Affect:  Flat  Thought Process: very disorganized. Circumstantial   Orientation:  Full (Time, Place, and Person)  Thought Content:  Hallucinations: Auditory and Tangential   Suicidal Thoughts:  No  Homicidal Thoughts:  No  Memory:  Immediate;   Fair Recent;   Fair  Judgement:  Intact  Insight:  improved  Psychomotor Activity:  Decreased  Concentration:  Concentration: Fair and Attention Span: Fair  Recall:  [employer] of Knowledge:  Fair  Language:  Fair  Akathisia:  No  Handed:  Right  AIMS (if indicated):   0  Assets:  Financial Resources/Insurance Leisure Time Resilience  ADL's:  Intact  Cognition:  WNL  Sleep:  Number of Hours: 0     Treatment Plan Summary: Daily contact with patient to assess and evaluate symptoms and progress in treatment and Medication management   #Schizoaffective Disorder/Bipolar type Shaylan appears worse today after restarting  the Rexulti and discontinuing Risperdal yesterday. She is having auditory hallucinations and is responding to internal stimuli. Despite history of tremor on typical antipsychotics, we will start risperdal and continue benztropine as this appears to have stabilized her illness.    1. Continue inpatient hospitalization.  May Recheck Tegretol level, CBC diff, and LFTs. Continue monitor vitals per floor protocol  2. Continue Tegretol  3. Discontinue rexulti 2 mg po bid for psychosis / 4. Start risperdal 3 mg po bid / continue Benztropine for EPS prophy 5. Continue Restoril to 45 mg PO QHS for insomnia 6. Discontinue trazodone 50 mg PO QHS PRN insomnia 7. Patient will participate in the therapeutic group milieu.     Tereso Newcomer, Medical Student 11/16/2018, 9:00 AM

## 2018-11-16 NOTE — BHH Group Notes (Signed)
[place] LCSW Group Therapy Note  Date/Time: 11/16/2018 @ 1pm  Type of Therapy and Topic:  Group Therapy:  Overcoming Obstacles  Participation Level:  Did not attend   Description of Group:    In this group patients will be encouraged to explore what they see as obstacles to their own wellness and recovery. They will be guided to discuss their thoughts, feelings, and behaviors related to these obstacles. The group will process together ways to cope with barriers, with attention given to specific choices patients can make. Each patient will be challenged to identify changes they are motivated to make in order to overcome their obstacles. This group will be process-oriented, with patients participating in exploration of their own experiences as well as giving and receiving support and challenge from other group members.  Therapeutic Goals: 1. Patient will identify personal and current obstacles as they relate to admission. 2. Patient will identify barriers that currently interfere with their wellness or overcoming obstacles.  3. Patient will identify feelings, thought process and behaviors related to these barriers. 4. Patient will identify two changes they are willing to make to overcome these obstacles:    Summary of Patient Progress   Patient did not attend group today.    Therapeutic Modalities:   Cognitive Behavioral Therapy Solution Focused Therapy Motivational Interviewing Relapse Prevention Therapy   Ardelle Anton, LCSW

## 2018-11-16 NOTE — Progress Notes (Signed)
Pt continues to be paranoid and delusional. Pt worried about her son going to jail and concerned that there were some conspiracy things going on. Pt appears to be responding at times with in appropriate laughing.

## 2018-11-16 NOTE — Plan of Care (Signed)
  Problem: Education: Goal: Knowledge of [facility] Education information/materials will improve Outcome: Progressing   Problem: Health Behavior/Discharge Planning: Goal: Compliance with treatment plan for underlying cause of condition will improve Outcome: Progressing   Problem: Safety: Goal: Periods of time without injury will increase Outcome: Progressing

## 2018-11-16 NOTE — Progress Notes (Signed)
Patient ID: [name], female   DOB: May 31, 1970, 48 y.o.   MRN: [id number]  Nursing Progress Note [id number]  Patient presents disorganized and responding to internal stimuli. Patient is compliant with scheduled medications. Patient is seen visible in the milieu at times. Patient currently denies SI/HI but does endorse hearing voices.  Patient safety maintained with q15 min safety checks.   Patient remains safe on the unit at this time. Will continue to support and monitor with plan of care.   Patient's self-inventory sheet Rated Energy Level  Normal  Rated Sleep  Good  Rated Appetite  Good  Rated Anxiety (0-10)  1  Rated Hopelessness (0-10)  0  Rated Depression (0-10)  3  Daily Goal  N/A  Any Additional Comments:

## 2018-11-16 NOTE — Progress Notes (Signed)
The focus of this group is to help patients establish daily goals to achieve during treatment and discuss how the patient can incorporate goal setting into their daily lives to aide in recovery. 

## 2018-11-17 MED ORDER — TRAZODONE HCL 150 MG PO TABS: 150.0000 mg | ORAL_TABLET | Freq: Every day | ORAL | 11 refills | Status: DC

## 2018-11-17 MED ORDER — CARBAMAZEPINE ER 200 MG PO TB12: 400.0000 mg | ORAL_TABLET | Freq: Every day | ORAL | 11 refills | Status: DC

## 2018-11-17 MED ORDER — BENZTROPINE MESYLATE 1 MG PO TABS: 1.0000 mg | ORAL_TABLET | Freq: Two times a day (BID) | ORAL | 11 refills | Status: DC

## 2018-11-17 MED ORDER — RISPERIDONE 3 MG PO TABS: 6.0000 mg | ORAL_TABLET | Freq: Every day | ORAL | 11 refills | Status: DC

## 2018-11-17 NOTE — Progress Notes (Signed)
Patient ID: [name], female   DOB: [birth year], 48 y.o.   MRN: [id number]   Social Work Environmental consultant and CSW tried to Conservation officer, historic buildings [facility] ACTT team for follow up for patient. Multiple attempts were made to inform ACTT team of the patient discharging.

## 2018-11-17 NOTE — Progress Notes (Signed)
Patient ID: [name], female   DOB: Feb 08, 1971, 48 y.o.   MRN: [id number] D: Patient in dayroom on approach. Pt observed interacting with peers. Pt reports she is doing well but does appear be to responding to internal stimuli. Pt thought process and behavior are disorganized. Pt denies SI/HI/AVH and pain. Cooperative with assessment. No acute distressed noted at this time.   A: Medications administered as prescribed. Support and encouragement provided as needed. Pt encouraged to discuss feelings and come to staff with any question or concerns.   R: Patient remains safe and complaint with medications.

## 2018-11-17 NOTE — Progress Notes (Signed)
Recreation Therapy Notes  INPATIENT RECREATION TR PLAN  Patient Details Name: [name] MRN: [id number] DOB: [birth year] Today's Date: 11/17/2018  Rec Therapy Plan Is patient appropriate for Therapeutic Recreation?: Yes Treatment times per week: 3-5 times per week Estimated Length of Stay: 5-7 days TR Treatment/Interventions: Group participation (Comment)  Discharge Criteria Pt will be discharged from therapy if:: Discharged Treatment plan/goals/alternatives discussed and agreed upon by:: Patient/family  Discharge Summary Short term goals set: See patient care plan Short term goals met: Complete Progress toward goals comments: Groups attended Which groups?: Goal setting, Self-esteem, Other (Comment)(Triggers) Reason goals not met: None Therapeutic equipment acquired: N/A Reason patient discharged from therapy: Discharge from hospital Pt/family agrees with progress & goals achieved: Yes Date patient discharged from therapy: 11/17/18    [name], LRT/CTRS  Ria Comment, Demetrick Eichenberger A 11/17/2018, 11:37 AM

## 2018-11-17 NOTE — BHH Suicide Risk Assessment (Signed)
[place] INPATIENT:  Family/Significant Other Suicide Prevention Education  Suicide Prevention Education:  Patient Refusal for Family/Significant Other Suicide Prevention Education: The patient [name] has refused to provide written consent for family/significant other to be provided Family/Significant Other Suicide Prevention Education during admission and/or prior to discharge.  Physician notified.  Trecia Rogers 11/17/2018, 9:16 AM

## 2018-11-17 NOTE — Progress Notes (Signed)
Patient ID: [name], female   DOB: 1970-07-27, 48 y.o.   MRN: [id number]   D: Pt alert and oriented on the unit.   A: Education, support, and encouragement provided. Discharge summary, medications and follow up appointments reviewed with pt. Suicide prevention resources provided, including "My 3 App." Pt's belongings in locker # 25 returned and belongings sheet signed.  R: Pt denies SI/HI, A/VH, pain, or any concerns at this time. Pt ambulatory on and off unit. Pt discharged to lobby.

## 2018-11-17 NOTE — BHH Suicide Risk Assessment (Signed)
[facility] Discharge Suicide Risk Assessment   Principal Problem: Exacerbation of underlying bipolar type condition, complicated by substance abuse and noncompliance Discharge Diagnoses: Active Problems:   Schizoaffective disorder ([place])   Total Time spent with patient: 45 minutes  Musculoskeletal: Strength & Muscle Tone: within normal limits Gait & Station: normal Patient leans: N/A  Psychiatric Specialty Exam: ROS  Blood pressure 139/76, pulse 75, temperature 98.2 F (36.8 C), temperature source Oral, resp. rate 16, height 5\' 1"  (1.549 m), SpO2 97 %.Body mass index is 44.15 kg/m.  General Appearance: Casual  Eye Contact::  Good  Speech:  Clear and [id number]  Volume:  Normal  Mood:  Euthymic  Affect:  Congruent  Thought Process:  Coherent and Linear  Orientation:  Full (Time, Place, and Person)  Thought Content:  Logical  Suicidal Thoughts:  No  Homicidal Thoughts:  No  Memory:  Immediate;   Fair  Judgement:  Fair  Insight:  Fair  Psychomotor Activity:  Normal  Concentration:  Good  Recall:  Good  Fund of Knowledge:Good  Language: Good  Akathisia:  Negative  Handed:  Right  AIMS (if indicated):     Assets:  Communication Skills Desire for Improvement Financial Resources/Insurance  Sleep:  Number of Hours: 4  Cognition: WNL  ADL's:  Intact   Mental Status Per Nursing Assessment::   On Admission:  NA  Demographic Factors:  Living alone  Loss Factors: Decrease in vocational status  Historical Factors: NA  Risk Reduction Factors:   Religious beliefs about death  Continued Clinical Symptoms:  Bipolar Disorder:   Mixed State  Cognitive Features That Contribute To Risk:  None    Suicide Risk:  Minimal: No identifiable suicidal ideation.  Patients presenting with no risk factors but with morbid ruminations; may be classified as minimal risk based on the severity of the depressive symptoms  Follow-up Information    [facility] Follow up.    Contact information: [place] [address] [phone]           Plan Of Care/Follow-up recommendations:  Activity:  full  Lucien Budney, MD 11/17/2018, 9:20 AM

## 2018-11-17 NOTE — Progress Notes (Signed)
Recreation Therapy Notes  Date: 7.31.20 Time: 1000 Location: 500 Hall Dayroom  Group Topic: Goal Setting  Goal Area(s) Addresses:  Patient will be able to identify at least 3 life goals.  Patient will be able to identify benefit of investing in life goals.  Patient will be able to identify benefit of setting life goals.   Behavioral Response:  Minimal  Intervention: Worksheet  Activity: Goal Planning.  Patients were to set goals for the week, month, year and five years.  Patients were to also identify any obstacles preventing from reaching their goals, what they need to achieve their goals and what they can start doing now to work towards their goals.  Education:  Discharge Planning, Radiographer, therapeutic, Leisure Education   Education Outcome: Acknowledges Education/In Group Clarification Provided/Needs Additional Education  Clinical Observations: Pt attempted to complete her worksheet but was preoccupied with her discharge.[name], LRT/CTRS    [name] A 11/17/2018 11:06 AM

## 2018-11-17 NOTE — Plan of Care (Signed)
Pt attended recreation therapy group sessions.   [name], LRT/CTRS

## 2018-11-17 NOTE — Tx Team (Signed)
Interdisciplinary Treatment and Diagnostic Plan Update  11/17/2018 Time of Session: 09:04am Azizah Lisle MRN: [id number]  Principal Diagnosis: <principal problem not specified>  Secondary Diagnoses: Active Problems:   Schizoaffective disorder (HCC)   Current Medications:  Current Facility-Administered Medications  Medication Dose Route Frequency Provider Last Rate Last Dose  . alum & mag hydroxide-simeth (MAALOX/MYLANTA) 200-200-20 MG/5ML suspension 30 mL  30 mL Oral Q4H PRN [name], NP      . benztropine (COGENTIN) tablet 1 mg  1 mg Oral BID [name], MD   1 mg at 11/17/18 4196  . carbamazepine (TEGRETOL) chewable tablet 100 mg  100 mg Oral Q1200 [name], MD   100 mg at 11/16/18 0824  . carbamazepine (TEGRETOL) chewable tablet 400 mg  400 mg Oral QHS [name], MD   400 mg at 11/16/18 2110  . haloperidol (HALDOL) tablet 5 mg  5 mg Oral Q6H PRN [name], MD   5 mg at 11/17/18 2229  . haloperidol lactate (HALDOL) injection 5 mg  5 mg Intramuscular Q6H PRN [name], MD      . LORazepam (ATIVAN) tablet 2 mg  2 mg Oral Q6H PRN [name], MD   2 mg at 11/16/18 0400   Or  . LORazepam (ATIVAN) injection 2 mg  2 mg Intramuscular Q6H PRN [name], MD      . magnesium hydroxide (MILK OF MAGNESIA) suspension 30 mL  30 mL Oral Daily PRN [name], NP      . naproxen (NAPROSYN) tablet 500 mg  500 mg Oral BID WC [name], NP   500 mg at 11/17/18 7989  . nicotine polacrilex (NICORETTE) gum 2 mg  2 mg Oral PRN [name], MD   2 mg at 11/17/18 0826  . risperiDONE (RISPERDAL) tablet 3 mg  3 mg Oral BID [name], MD   3 mg at 11/17/18 2119  . temazepam (RESTORIL) capsule 30 mg  30 mg Oral QHS [name], NP   30 mg at 11/16/18 2110  . traZODone (DESYREL) tablet 50 mg  50 mg Oral QHS,MR X 1 [name], NP   50 mg at 11/13/18 2108   PTA Medications: Medications Prior to Admission  Medication Sig Dispense Refill Last Dose  . amantadine  (SYMMETREL) 100 MG capsule Take 1 capsule (100 mg total) by mouth 2 (two) times daily. (Patient not taking: Reported on 10/29/2018) 60 capsule 0 Not Taking at Unknown time  . haloperidol decanoate (HALDOL DECANOATE) 100 MG/ML injection Inject 0.5 mLs (50 mg total) into the muscle every 28 (twenty-eight) days. (Patient not taking: Reported on 10/29/2018) 1 mL 0 Not Taking at Unknown time  . traZODone (DESYREL) 100 MG tablet Take 1 tablet (100 mg total) by mouth at bedtime. (Patient not taking: Reported on 10/29/2018) 30 tablet 0 Not Taking at Unknown time    Patient Stressors: Medication change or noncompliance  Patient Strengths: Ability for insight Capable of independent living General fund of knowledge  Treatment Modalities: Medication Management, Group therapy, Case management,  1 to 1 session with clinician, Psychoeducation, Recreational therapy.   Physician Treatment Plan for Primary Diagnosis: <principal problem not specified> Long Term Goal(s): Improvement in symptoms so as ready for discharge Improvement in symptoms so as ready for discharge   Short Term Goals: Ability to demonstrate self-control will improve Ability to identify and develop effective coping behaviors will improve Ability to maintain clinical measurements within normal limits will improve Compliance with prescribed medications will  improve Ability to demonstrate self-control will improve Ability to identify and develop effective coping behaviors will improve Ability to maintain clinical measurements within normal limits will improve Compliance with prescribed medications will improve  Medication Management: Evaluate patient's response, side effects, and tolerance of medication regimen.  Therapeutic Interventions: 1 to 1 sessions, Unit Group sessions and Medication administration.  Evaluation of Outcomes: Adequate for Discharge  Physician Treatment Plan for Secondary Diagnosis: Active Problems:   Schizoaffective  disorder ([place])  Long Term Goal(s): Improvement in symptoms so as ready for discharge Improvement in symptoms so as ready for discharge   Short Term Goals: Ability to demonstrate self-control will improve Ability to identify and develop effective coping behaviors will improve Ability to maintain clinical measurements within normal limits will improve Compliance with prescribed medications will improve Ability to demonstrate self-control will improve Ability to identify and develop effective coping behaviors will improve Ability to maintain clinical measurements within normal limits will improve Compliance with prescribed medications will improve     Medication Management: Evaluate patient's response, side effects, and tolerance of medication regimen.  Therapeutic Interventions: 1 to 1 sessions, Unit Group sessions and Medication administration.  Evaluation of Outcomes: Adequate for Discharge   RN Treatment Plan for Primary Diagnosis: <principal problem not specified> Long Term Goal(s): Knowledge of disease and therapeutic regimen to maintain health will improve  Short Term Goals: Ability to participate in decision making will improve, Ability to verbalize feelings will improve, Ability to disclose and discuss suicidal ideas, Ability to identify and develop effective coping behaviors will improve and Compliance with prescribed medications will improve  Medication Management: RN will administer medications as ordered by provider, will assess and evaluate patient's response and provide education to patient for prescribed medication. RN will report any adverse and/or side effects to prescribing provider.  Therapeutic Interventions: 1 on 1 counseling sessions, Psychoeducation, Medication administration, Evaluate responses to treatment, Monitor vital signs and CBGs as ordered, Perform/monitor CIWA, COWS, AIMS and Fall Risk screenings as ordered, Perform wound care treatments as  ordered.  Evaluation of Outcomes: Adequate for Discharge   LCSW Treatment Plan for Primary Diagnosis: <principal problem not specified> Long Term Goal(s): Safe transition to appropriate next level of care at discharge, Engage patient in therapeutic group addressing interpersonal concerns.  Short Term Goals: Engage patient in aftercare planning with referrals and resources and Increase skills for wellness and recovery  Therapeutic Interventions: Assess for all discharge needs, 1 to 1 time with Social worker, Explore available resources and support systems, Assess for adequacy in community support network, Educate family and significant other(s) on suicide prevention, Complete Psychosocial Assessment, Interpersonal group therapy.  Evaluation of Outcomes: Adequate for Discharge   Progress in Treatment: Attending groups: No. Participating in groups: No. Taking medication as prescribed: Yes. Toleration medication: Yes. Family/Significant other contact made: Yes, individual(s) contacted:  with pt Patient understands diagnosis: No. Discussing patient identified problems/goals with staff: Yes. Medical problems stabilized or resolved: Yes. Denies suicidal/homicidal ideation: Yes. Issues/concerns per patient self-inventory: No. Other:   New problem(s) identified: No, Describe:  None  New Short Term/Long Term Goal(s): Medication stabilization, elimination of SI thoughts, and development of a comprehensive mental wellness plan.   Patient Goals:    Discharge Plan or Barriers: Pt will be discharging back home and continuing with ACTT.  Reason for Continuation of Hospitalization: Patient is discharging today.   Estimated Length of Stay: Patient is discharging today.    Attendees: Patient: 11/17/2018   Physician: Dr. [name], MD 11/17/2018  Nursing: Thomos Lemons 11/17/2018  RN Care Manager: 11/17/2018   Social Worker: Ardelle Anton, [place] 11/17/2018   Recreational Therapist:  11/17/2018   Other:  11/17/2018  Other:  11/17/2018  Other: 11/17/2018      Scribe for Treatment Team: Trecia Rogers, LCSW 11/17/2018 11:13 AM

## 2018-11-17 NOTE — Progress Notes (Signed)
  [facility] Adult Case Management Discharge Plan :  Will you be returning to the same living situation after discharge:  Yes,  home At discharge, do you have transportation home?: Yes,  [place] [employer] 11:30am Do you have the ability to pay for your medications: Yes,  medicaid  Release of information consent forms completed and in the chart;  Patient's signature needed at discharge.  Patient to Follow up at: Follow-up Information    [facility] Follow up.   Why: Your ACTT will contact/see you within 24 hours of discharging from the hospital.  Contact information: [address] [place] [address] [phone]           Next level of care provider has access to [facility] and Suicide Prevention discussed: Yes,  declined; with pt     Has patient been referred to the Quitline?: Patient refused referral  Patient has been referred for addiction treatment: Yes  Trecia Rogers, LCSW 11/17/2018, 11:30 AM

## 2018-11-17 NOTE — Discharge Summary (Signed)
Physician Discharge Summary Note  Patient:  [name] is an 48 y.o., female MRN:  [id number] DOB:  Apr 21, 1970 Patient phone:  [phone] (home)  Patient address:   [place] [address],  Total Time spent with patient: 45 minutes  Date of Admission:  11/08/2018 Date of Discharge: 11/17/2018  Reason for Admission:   [name] is 48 years old and she is well-known to [facility] she is known to have a bipolar/schizoaffective type condition, and acknowledges noncompliance with her medications for several days, this is following several weeks of intermittent compliance, and intermittent disorganized thought and behavior. Her drug screen is pending she is refused to give over laboratory work thus far.  She has an extensive psychiatric history, has responded to a combination of Tegretol and brexpiprazole when she is compliant with it.  When she is not she can become quite disorganized, often leaves town without any particular plans, has even been hospitalized in the [place] area as a result of 1 of these episodes. Past medication trials included Prolixin which was effective but caused excessive EPS and tremors, I iloperidone which was also somewhat effective without movement disorders but her dislike of the agent led to switch his to Tegretol/brexpiprazole combination that seem very effective when she took it.  She spent the majority of yesterday morning wandering around outside the hospital grounds, and not speaking eventually she came and was noted to be in a disorganized state of mind.  She displayed thought blocking and when she did speak had numerous delusions and hallucinations stating she was seeing demons they were "the enemy" and was quite paranoid.  She also described auditory hallucinations but again her history was quite disjointed and rambling.  This morning describes her self is exhausted and still displayed thought blocking as  well as mild auditory hallucinations.   Principal Problem: Exacerbation of underlying schizoaffective disorder complicated by poor compliance and abuse of drugs  discharge Diagnoses: Active Problems:   Schizoaffective disorder [facility])  Past Medical History:  Past Medical History:  Diagnosis Date  . Arthritis   . Bipolar 1 disorder ([place])   . Bronchitis   . Depression   . Hemorrhoid   . Schizoaffective disorder ([place])   . Schizophrenia ([place])    Pt denies and reports it is schizoaffective disorder.   . Transfusion history     Past Surgical History:  Procedure Laterality Date  . CESAREAN SECTION     Family History:  Family History  Problem Relation Age of Onset  . Diabetes type II Other    Family Psychiatric  History: no new data Social History:  Social History   Substance and Sexual Activity  Alcohol Use No     Social History   Substance and Sexual Activity  Drug Use No  . Types: Marijuana   Comment: Former user    Social History   Socioeconomic History  . Marital status: Legally Separated    Spouse name: Not on file  . Number of children: Not on file  . Years of education: Not on file  . Highest education level: Not on file  Occupational History  . Not on file  Social Needs  . Financial resource strain: Not on file  . Food insecurity    Worry: Not on file    Inability: Not on file  . Transportation needs    Medical: Not on file    Non-medical: Not on file  Tobacco Use  .  Smoking status: Current Every Day Smoker    Packs/day: 0.50    Years: 17.00    Pack years: 8.50    Types: Cigarettes  . Smokeless tobacco: Never Used  Substance and Sexual Activity  . Alcohol use: No  . Drug use: No    Types: Marijuana    Comment: Former user  . Sexual activity: Never  Lifestyle  . Physical activity    Days per week: Not on file    Minutes per session: Not on file  . Stress: Not on file  Relationships  . Social Herbalist on phone: Not on file     Gets together: Not on file    Attends religious service: Not on file    Active member of club or organization: Not on file    Attends meetings of clubs or organizations: Not on file    Relationship status: Not on file  Other Topics Concern  . Not on file  Social History Narrative   ** Merged History Encounter **        Hospital Course:    [name] was admitted under routine precautions and during her stay displayed no dangerous behaviors although she was initially quite disorganized. Further, she never gave over a urine drug screen and continually denied the fact that she was doing drugs for the first few days, eventually implying she been abusing cannabis and/or cocaine but never being specific.  At any rate she wanted to go back on the brexpiprazole/Tegretol combination but it simply did not work for her.  Therefore we switch her to Risperdal and got a much better response.   By the date of the 31st she had recalibrated to her baseline status she was noted to be alert oriented and cooperative without acute psychosis or mania.  No cravings tremors or withdrawal.  Stable to release on the meds listed below.   No EPS or TD she has had tremors before on Prolixin but not on Risperdal thus far  Physical Findings: AIMS: Facial and Oral Movements Muscles of Facial Expression: None, normal Lips and Perioral Area: None, normal Jaw: None, normal Tongue: None, normal,Extremity Movements Upper (arms, wrists, hands, fingers): None, normal Lower (legs, knees, ankles, toes): None, normal, Trunk Movements Neck, shoulders, hips: None, normal, Overall Severity Severity of abnormal movements (highest score from questions above): None, normal Incapacitation due to abnormal movements: None, normal Patient's awareness of abnormal movements (rate only patient's report): No Awareness, Dental Status Current problems with teeth and/or dentures?: No Does patient usually wear dentures?: No  CIWA:    COWS:       Musculoskeletal: Strength & Muscle Tone: within normal limits Gait & Station: normal Patient leans: N/A  Psychiatric Specialty Exam: ROS  Blood pressure 139/76, pulse 75, temperature 98.2 F (36.8 C), temperature source Oral, resp. rate 16, height 5\' 1"  (1.549 m), SpO2 97 %.Body mass index is 44.15 kg/m.  General Appearance: Casual  Eye Contact::  Good  Speech:  Clear and [id number]  Volume:  Normal  Mood:  Euthymic  Affect:  Congruent  Thought Process:  Coherent and Linear  Orientation:  Full (Time, Place, and Person)  Thought Content:  Logical  Suicidal Thoughts:  No  Homicidal Thoughts:  No  Memory:  Immediate;   Fair  Judgement:  Fair  Insight:  Fair  Psychomotor Activity:  Normal  Concentration:  Good  Recall:  Good  Fund of Knowledge:Good  Language: Good  Akathisia:  Negative  Handed:  Right  AIMS (if indicated):     Assets:  Communication Skills Desire for Improvement Financial Resources/Insurance  Sleep:  Number of Hours: 4  Cognition: WNL  ADL's:  Intact          Has this patient used any form of tobacco in the last 30 days? (Cigarettes, Smokeless Tobacco, Cigars, and/or Pipes) Yes, No  Blood Alcohol level:  Lab Results  Component Value Date   ETH <10 11/11/2017   ETH <10 42/87/6811    Metabolic Disorder Labs:  Lab Results  Component Value Date   HGBA1C 6.3 (H) 01/30/2017   MPG 134.11 01/30/2017   MPG 128 05/11/2015   No results found for: PROLACTIN Lab Results  Component Value Date   CHOL 157 01/29/2017   TRIG 119 01/29/2017   HDL 40 (L) 01/29/2017   CHOLHDL 3.9 01/29/2017   VLDL 24 01/29/2017   [place] 93 01/29/2017   [place] 97 05/11/2015    See Psychiatric Specialty Exam and Suicide Risk Assessment completed by Attending Physician prior to discharge.  Discharge destination:  Home  Is patient on multiple antipsychotic therapies at discharge:  No   Has Patient had three or more failed trials of antipsychotic monotherapy  by history:  No  Recommended Plan for Multiple Antipsychotic Therapies: NA   Allergies as of 11/17/2018      Reactions   Latuda [lurasidone Hcl] Nausea And Vomiting, Other (See Comments)   Reaction:  Shaking    Abilify [aripiprazole] Other (See Comments)   shaking   Flagyl [metronidazole Hcl] Nausea And Vomiting   Prolixin [fluphenazine] Other (See Comments)   shaking   Risperidone And Related Other (See Comments)   shaking   Seroquel [quetiapine Fumarate]    Patient states she had a syncopal episode after taking medication.    Sulfa Antibiotics Nausea And Vomiting      Medication List    STOP taking these medications   amantadine 100 MG capsule Commonly known as: SYMMETREL   haloperidol decanoate 100 MG/ML injection Commonly known as: HALDOL DECANOATE     TAKE these medications     Indication  benztropine 1 MG tablet Commonly known as: COGENTIN Take 1 tablet (1 mg total) by mouth 2 (two) times daily.  Indication: Extrapyramidal Reaction caused by Medications   carbamazepine 200 MG 12 hr tablet Commonly known as: TEGretol-XR Take 2 tablets (400 mg total) by mouth at bedtime.  Indication: Manic-Depression   risperiDONE 3 MG tablet Commonly known as: RISPERDAL Take 2 tablets (6 mg total) by mouth at bedtime.  Indication: Hypomanic Episode of Bipolar Disorder   traZODone 150 MG tablet Commonly known as: DESYREL Take 1 tablet (150 mg total) by mouth at bedtime. What changed:   medication strength  how much to take  Indication: Abuse or Misuse of Alcohol, Drug-Induced Difficulty in Voluntary Movement      Follow-up Information    [facility] Follow up.   Why: ACTT Team  Contact information: [place] [address] [phone]           Signed: [name], MD 11/17/2018, 10:51 AM

## 2018-11-17 NOTE — Progress Notes (Signed)
Patient ID: [name], female   DOB: 1970/06/03, 48 y.o.   MRN: [id number]   CSW scheduled Roland for 11:30am. CSW notified pt's nurse.

## 2018-11-17 NOTE — Progress Notes (Signed)
Patient ID: [name], female   DOB: [birth year], 48 y.o.   MRN: [id number] [place] NOVEL CORONAVIRUS (COVID-19) DAILY CHECK-OFF SYMPTOMS - answer yes or no to each - every day NO YES  Have you had a fever in the past 24 hours?  . Fever (Temp > 37.80C / 100F) X   Have you had any of these symptoms in the past 24 hours? . New Cough .  Sore Throat  .  Shortness of Breath .  Difficulty Breathing .  Unexplained Body Aches   X   Have you had any one of these symptoms in the past 24 hours not related to allergies?   . Runny Nose .  Nasal Congestion .  Sneezing   X   If you have had runny nose, nasal congestion, sneezing in the past 24 hours, has it worsened?  X   EXPOSURES - check yes or no X   Have you traveled outside the state in the past 14 days?  X   Have you been in contact with someone with a confirmed diagnosis of COVID-19 or PUI in the past 14 days without wearing appropriate PPE?  X   Have you been living in the same home as a person with confirmed diagnosis of COVID-19 or a PUI (household contact)?    X   Have you been diagnosed with COVID-19?    X              What to do next: Answered NO to all: Answered YES to anything:   Proceed with unit schedule Follow the [facility] Inpatient Flowsheet.

## 2019-01-24 ENCOUNTER — Emergency Department (HOSPITAL_COMMUNITY)
Admission: EM | Admit: 2019-01-24 | Discharge: 2019-01-25 | Disposition: A | Discharge status: Home / Self Care | Payer: Medicaid Other | Attending: Emergency Medicine | Admitting: Emergency Medicine

## 2019-01-24 ENCOUNTER — Encounter (HOSPITAL_COMMUNITY): Payer: Self-pay

## 2019-01-24 DIAGNOSIS — F1210 Cannabis abuse, uncomplicated: Secondary | ICD-10-CM | POA: Insufficient documentation

## 2019-01-24 DIAGNOSIS — F22 Delusional disorders: Secondary | ICD-10-CM | POA: Insufficient documentation

## 2019-01-24 DIAGNOSIS — Z79899 Other long term (current) drug therapy: Secondary | ICD-10-CM | POA: Diagnosis not present

## 2019-01-24 DIAGNOSIS — Z046 Encounter for general psychiatric examination, requested by authority: Secondary | ICD-10-CM | POA: Diagnosis present

## 2019-01-24 DIAGNOSIS — F17210 Nicotine dependence, cigarettes, uncomplicated: Secondary | ICD-10-CM | POA: Diagnosis not present

## 2019-01-24 DIAGNOSIS — R443 Hallucinations, unspecified: Secondary | ICD-10-CM
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
## 2019-01-25 LAB — CBC WITH DIFFERENTIAL/PLATELET
Abs Immature Granulocytes: 0.02 10*3/uL (ref 0.00–0.07)
Basophils Absolute: 0.1 10*3/uL (ref 0.0–0.1)
Basophils Relative: 1 %
Eosinophils Absolute: 0.1 10*3/uL (ref 0.0–0.5)
Eosinophils Relative: 2 %
HCT: 32.4 % — ABNORMAL LOW (ref 36.0–46.0)
Hemoglobin: 10 g/dL — ABNORMAL LOW (ref 12.0–15.0)
Immature Granulocytes: 0 %
Lymphocytes Relative: 41 %
Lymphs Abs: 3.2 10*3/uL (ref 0.7–4.0)
MCH: 27.2 pg (ref 26.0–34.0)
MCHC: 30.9 g/dL (ref 30.0–36.0)
MCV: 88.3 fL (ref 80.0–100.0)
Monocytes Absolute: 0.6 10*3/uL (ref 0.1–1.0)
Monocytes Relative: 7 %
Neutro Abs: 3.9 10*3/uL (ref 1.7–7.7)
Neutrophils Relative %: 49 %
Platelets: 328 10*3/uL (ref 150–400)
RBC: 3.67 MIL/uL — ABNORMAL LOW (ref 3.87–5.11)
RDW: 18.9 % — ABNORMAL HIGH (ref 11.5–15.5)
WBC: 7.8 10*3/uL (ref 4.0–10.5)
nRBC: 0 % (ref 0.0–0.2)

## 2019-01-25 LAB — COMPREHENSIVE METABOLIC PANEL
ALT: 11 U/L (ref 0–44)
AST: 17 U/L (ref 15–41)
Albumin: 4.4 g/dL (ref 3.5–5.0)
Alkaline Phosphatase: 76 U/L (ref 38–126)
Anion gap: 8 (ref 5–15)
BUN: 10 mg/dL (ref 6–20)
CO2: 25 mmol/L (ref 22–32)
Calcium: 9 mg/dL (ref 8.9–10.3)
Chloride: 103 mmol/L (ref 98–111)
Creatinine, Ser: 0.89 mg/dL (ref 0.44–1.00)
GFR calc Af Amer: >60 mL/min (ref >60)
GFR calc non Af Amer: >60 mL/min (ref >60)
Glucose, Bld: 85 mg/dL (ref 70–99)
Potassium: 4 mmol/L (ref 3.5–5.1)
Sodium: 136 mmol/L (ref 135–145)
Total Bilirubin: 0.3 mg/dL (ref 0.3–1.2)
Total Protein: 8.7 g/dL — ABNORMAL HIGH (ref 6.5–8.1)

## 2019-01-25 LAB — RAPID URINE DRUG SCREEN, HOSP PERFORMED
Amphetamines: NOT DETECTED
Barbiturates: NOT DETECTED
Benzodiazepines: NOT DETECTED
Cocaine: NOT DETECTED
Opiates: NOT DETECTED
Tetrahydrocannabinol: POSITIVE — AB

## 2019-01-25 LAB — ETHANOL: Alcohol, Ethyl (B): <10 mg/dL (ref <10)

## 2019-01-25 LAB — SALICYLATE LEVEL: Salicylate Lvl: <7 mg/dL (ref 2.8–30.0)

## 2019-01-25 LAB — ACETAMINOPHEN LEVEL: Acetaminophen (Tylenol), Serum: <10 ug/mL — ABNORMAL LOW (ref 10–30)

## 2019-01-25 MED ORDER — NICOTINE 21 MG/24HR TD PT24: 21.0000 mg | MEDICATED_PATCH | Freq: Every day | TRANSDERMAL | Status: DC

## 2019-01-25 NOTE — ED Notes (Addendum)
I'm unable to locate the pt. Another staff member said, she saw pt walking toward the exit with a pack of cigarettes in her hand.

## 2019-01-25 NOTE — ED Notes (Signed)
Clinician attempted to complete assessment, patient refused. Patient stated, "I am not trying to do this". Patient stated, "I came in for body ache and have not received my results". Clinician attempted to persuade patient, she refused and walked out of the room saying "no". RN informed of patient walking out without completing assessment.

## 2019-01-25 NOTE — ED Notes (Addendum)
Unable to find pt. Discharging her from [facility].

## 2019-01-25 NOTE — ED Notes (Signed)
Took pt to room 8 for TTS consult. Pt said, "I don't want to do this. I need to smoke a cigarette." I asked her to please stay and have a conversation with TTS. I answered the call from TTS and left the room. When I returned to the room, 10 minutes later, the patient was not there.
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
## 2019-02-05 ENCOUNTER — Emergency Department (HOSPITAL_COMMUNITY)
Admission: EM | Admit: 2019-02-05 | Discharge: 2019-02-06 | Disposition: A | Discharge status: Other Acute Inpatient Hospital | Payer: Medicaid Other | Attending: Emergency Medicine | Admitting: Emergency Medicine

## 2019-02-05 ENCOUNTER — Other Ambulatory Visit: Payer: Self-pay

## 2019-02-05 DIAGNOSIS — Z79899 Other long term (current) drug therapy: Secondary | ICD-10-CM | POA: Insufficient documentation

## 2019-02-05 DIAGNOSIS — F259 Schizoaffective disorder, unspecified: Secondary | ICD-10-CM | POA: Diagnosis not present

## 2019-02-05 DIAGNOSIS — F17210 Nicotine dependence, cigarettes, uncomplicated: Secondary | ICD-10-CM | POA: Diagnosis not present

## 2019-02-05 DIAGNOSIS — R4182 Altered mental status, unspecified: Secondary | ICD-10-CM | POA: Diagnosis present

## 2019-02-05 DIAGNOSIS — Z20828 Contact with and (suspected) exposure to other viral communicable diseases: Secondary | ICD-10-CM | POA: Diagnosis not present

## 2019-02-05 DIAGNOSIS — F250 Schizoaffective disorder, bipolar type: Secondary | ICD-10-CM | POA: Diagnosis present

## 2019-02-05 DIAGNOSIS — F29 Unspecified psychosis not due to a substance or known physiological condition: Secondary | ICD-10-CM

## 2019-02-05 LAB — CBC WITH DIFFERENTIAL/PLATELET
Abs Immature Granulocytes: 0.02 10*3/uL (ref 0.00–0.07)
Basophils Absolute: 0.1 10*3/uL (ref 0.0–0.1)
Basophils Relative: 1 %
Eosinophils Absolute: 0.1 10*3/uL (ref 0.0–0.5)
Eosinophils Relative: 1 %
HCT: 32.1 % — ABNORMAL LOW (ref 36.0–46.0)
Hemoglobin: 9.9 g/dL — ABNORMAL LOW (ref 12.0–15.0)
Immature Granulocytes: 0 %
Lymphocytes Relative: 29 %
Lymphs Abs: 2.9 10*3/uL (ref 0.7–4.0)
MCH: 27 pg (ref 26.0–34.0)
MCHC: 30.8 g/dL (ref 30.0–36.0)
MCV: 87.7 fL (ref 80.0–100.0)
Monocytes Absolute: 0.6 10*3/uL (ref 0.1–1.0)
Monocytes Relative: 6 %
Neutro Abs: 6.5 10*3/uL (ref 1.7–7.7)
Neutrophils Relative %: 63 %
Platelets: 285 10*3/uL (ref 150–400)
RBC: 3.66 MIL/uL — ABNORMAL LOW (ref 3.87–5.11)
RDW: 18.3 % — ABNORMAL HIGH (ref 11.5–15.5)
WBC: 10.1 10*3/uL (ref 4.0–10.5)
nRBC: 0 % (ref 0.0–0.2)

## 2019-02-05 LAB — COMPREHENSIVE METABOLIC PANEL
ALT: 12 U/L (ref 0–44)
AST: 17 U/L (ref 15–41)
Albumin: 4.1 g/dL (ref 3.5–5.0)
Alkaline Phosphatase: 77 U/L (ref 38–126)
Anion gap: 10 (ref 5–15)
BUN: 8 mg/dL (ref 6–20)
CO2: 25 mmol/L (ref 22–32)
Calcium: 8.9 mg/dL (ref 8.9–10.3)
Chloride: 104 mmol/L (ref 98–111)
Creatinine, Ser: 0.93 mg/dL (ref 0.44–1.00)
GFR calc Af Amer: >60 mL/min (ref >60)
GFR calc non Af Amer: >60 mL/min (ref >60)
Glucose, Bld: 103 mg/dL — ABNORMAL HIGH (ref 70–99)
Potassium: 3.8 mmol/L (ref 3.5–5.1)
Sodium: 139 mmol/L (ref 135–145)
Total Bilirubin: 0.7 mg/dL (ref 0.3–1.2)
Total Protein: 8.4 g/dL — ABNORMAL HIGH (ref 6.5–8.1)

## 2019-02-05 LAB — ETHANOL: Alcohol, Ethyl (B): <10 mg/dL (ref <10)

## 2019-02-05 LAB — I-STAT BETA HCG BLOOD, ED (MC, WL, AP ONLY): I-stat hCG, quantitative: <5 m[IU]/mL (ref <5)

## 2019-02-05 LAB — SARS CORONAVIRUS 2 BY RT PCR (HOSPITAL ORDER, PERFORMED IN ~~LOC~~ HOSPITAL LAB): SARS Coronavirus 2: NEGATIVE

## 2019-02-05 MED ORDER — TRAZODONE HCL 50 MG PO TABS
150.0000 mg | ORAL_TABLET | Freq: Every day | ORAL | Status: DC
  Filled 2019-02-05 (×2): qty 1

## 2019-02-05 MED ORDER — RISPERIDONE 2 MG PO TABS
6.0000 mg | ORAL_TABLET | Freq: Every day | ORAL | Status: DC
  Administered 2019-02-06 (×2): 6 mg via ORAL
  Filled 2019-02-05 (×3): qty 3

## 2019-02-05 MED ORDER — BENZTROPINE MESYLATE 1 MG PO TABS
1.0000 mg | ORAL_TABLET | Freq: Two times a day (BID) | ORAL | Status: DC
  Administered 2019-02-06 (×3): 1 mg via ORAL
  Filled 2019-02-05: qty 2
  Filled 2019-02-05 (×3): qty 1

## 2019-02-05 MED ORDER — ZOLPIDEM TARTRATE 5 MG PO TABS: 5.0000 mg | ORAL_TABLET | Freq: Every evening | ORAL | Status: DC | PRN

## 2019-02-05 MED ORDER — CARBAMAZEPINE ER 200 MG PO TB12
400.0000 mg | ORAL_TABLET | Freq: Every day | ORAL | Status: DC
  Administered 2019-02-06 (×2): 400 mg via ORAL
  Filled 2019-02-05 (×3): qty 2

## 2019-02-05 MED ORDER — ALUM & MAG HYDROXIDE-SIMETH 200-200-20 MG/5ML PO SUSP: 30.0000 mL | Freq: Four times a day (QID) | ORAL | Status: DC | PRN
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
## 2019-02-05 NOTE — ED Triage Notes (Addendum)
Pt BIB EMS from home. Pt has hx of schizophrenia, pt had meds adjusted recently with psychiatrist. Pt believes meds are wrong, pt c/o trouble focusing. Pt reports son is missing. Pt has spoken with [employer] regarding son. Pt denies pain. Pt did have screaming episode with EMS prior to arrival to ED.   CBG 124

## 2019-02-05 NOTE — ED Notes (Signed)
Pt has left her bed and is no where to be seen. Security aware. Dr. Melina Copa aware.

## 2019-02-05 NOTE — ED Provider Notes (Signed)
[facility] DEPT Provider Note   CSN: [id number] Arrival date & time: 02/05/19  1453     History   Chief Complaint Chief Complaint  Patient presents with  . Altered Mental Status    HPI [name] is a 48 y.o. female.  She is a very poor historian.  She is brought in by EMS from home.  She said she is here to see Dr. [name] and needs her psychiatric medications adjusted.  When she changes the subject and said she is here to file a missing persons report port.  She denies any medical complaints.  Per the nursing note it sounds like she is spoken with [facility] police regarding her son.  On review of prior notes she was here about 11 days ago for disorganized thinking but ultimately eloped before getting an evaluation.     The history is provided by the patient and the EMS personnel.  Mental Health Problem Presenting symptoms: delusional and disorganized thought process   Degree of incapacity (severity):  Unable to specify Onset quality:  Unable to specify Context: recent medication change   Context: not alcohol use and not drug abuse   Relieved by:  None tried Worsened by:  Nothing Ineffective treatments:  None tried Associated symptoms: no abdominal pain, no chest pain and no headaches   Risk factors: hx of mental illness and recent psychiatric admission     Past Medical History:  Diagnosis Date  . Arthritis   . Bipolar 1 disorder ([place])   . Bronchitis   . Depression   . Hemorrhoid   . Schizoaffective disorder ([place])   . Schizophrenia ([place])    Pt denies and reports it is schizoaffective disorder.   . Transfusion history     Patient Active Problem List   Diagnosis Date Noted  . Schizoaffective disorder ([place]) 11/08/2018  . No diagnosis on Axis I 08/21/2015  . Abnormal behavior   . Involuntary commitment   . Schizophrenia ([place])   . Disturbance in affect   . Tobacco use disorder 05/12/2015  . Cannabis use disorder, moderate,  dependence ([place]) 05/12/2015  . Schizo affective schizophrenia ([place]) 05/10/2015  . Hypertension 02/24/2011  . ANEMIA-IRON DEFICIENCY 12/13/2006  . GERD 12/13/2006  . OBESITY 12/12/2006  . Schizoaffective disorder, bipolar type ([place]) 12/12/2006  . HEMORRHOIDS, NOS 12/12/2006  . PAIN-NECK 12/12/2006  . TREMOR 12/12/2006    Past Surgical History:  Procedure Laterality Date  . CESAREAN SECTION       OB History   No obstetric history on file.      Home Medications    Prior to Admission medications   Medication Sig Start Date End Date Taking? Authorizing Provider  benztropine (COGENTIN) 1 MG tablet Take 1 tablet (1 mg total) by mouth 2 (two) times daily. 11/17/18   [name], MD  carbamazepine (TEGRETOL-XR) 200 MG 12 hr tablet Take 2 tablets (400 mg total) by mouth at bedtime. 11/17/18 11/17/19  [name], MD  risperiDONE (RISPERDAL) 3 MG tablet Take 2 tablets (6 mg total) by mouth at bedtime. 11/17/18   [name], MD  traZODone (DESYREL) 150 MG tablet Take 1 tablet (150 mg total) by mouth at bedtime. 11/17/18   [name], MD    Family History Family History  Problem Relation Age of Onset  . Diabetes type II Other     Social History Social History   Tobacco Use  . Smoking status: Current Every Day Smoker    Packs/day: 0.50  Years: 17.00    Pack years: 8.50    Types: Cigarettes  . Smokeless tobacco: Never Used  Substance Use Topics  . Alcohol use: No  . Drug use: No    Types: Marijuana    Comment: Former user     Allergies   Latuda [lurasidone hcl], Abilify [aripiprazole], Flagyl [metronidazole hcl], Prolixin [fluphenazine], Risperidone and related, Seroquel [quetiapine fumarate], and Sulfa antibiotics   Review of Systems Review of Systems  Constitutional: Negative for fever.  HENT: Negative for sore throat.   Eyes: Negative for visual disturbance.  Respiratory: Negative for shortness of breath.   Cardiovascular: Negative for chest pain.   Gastrointestinal: Negative for abdominal pain.  Genitourinary: Negative for dysuria.  Musculoskeletal: Negative for neck pain.  Skin: Negative for rash.  Neurological: Negative for headaches.  Psychiatric/Behavioral: Positive for decreased concentration.     Physical Exam Updated Vital Signs BP (!) 141/66 (BP Location: Right Arm)   Pulse 69   Resp 18   SpO2 98%   Physical Exam Vitals signs and nursing note reviewed.  Constitutional:      General: She is not in acute distress.    Appearance: She is well-developed.  HENT:     Head: Normocephalic and atraumatic.  Eyes:     Conjunctiva/sclera: Conjunctivae normal.  Neck:     Musculoskeletal: Neck supple.  Cardiovascular:     Rate and Rhythm: Normal rate and regular rhythm.     Heart sounds: No murmur.  Pulmonary:     Effort: Pulmonary effort is normal. No respiratory distress.     Breath sounds: Normal breath sounds.  Abdominal:     Palpations: Abdomen is soft.     Tenderness: There is no abdominal tenderness.  Skin:    General: Skin is warm and dry.  Neurological:     General: No focal deficit present.     Mental Status: She is alert.  Psychiatric:        Attention and Perception: She is inattentive.        Speech: Speech is delayed.      ED Treatments / Results  Labs (all labs ordered are listed, but only abnormal results are displayed) Labs Reviewed  COMPREHENSIVE METABOLIC PANEL - Abnormal; Notable for the following components:      Result Value   Glucose, Bld 103 (*)    Total Protein 8.4 (*)    All other components within normal limits  CBC WITH DIFFERENTIAL/PLATELET - Abnormal; Notable for the following components:   RBC 3.66 (*)    Hemoglobin 9.9 (*)    HCT 32.1 (*)    RDW 18.3 (*)    All other components within normal limits  SARS CORONAVIRUS 2 BY RT PCR (HOSPITAL ORDER, [facility])  ETHANOL  RAPID URINE DRUG SCREEN, HOSP PERFORMED  I-STAT BETA HCG BLOOD, ED ([facility], [facility], [facility]  ONLY)    EKG None  Radiology No results found.  Procedures Procedures (including critical care time)  Medications Ordered in ED Medications  benztropine (COGENTIN) tablet 1 mg (has no administration in time range)  carbamazepine (TEGRETOL XR) 12 hr tablet 400 mg (has no administration in time range)  risperiDONE (RISPERDAL) tablet 6 mg (has no administration in time range)  traZODone (DESYREL) tablet 150 mg (has no administration in time range)  zolpidem (AMBIEN) tablet 5 mg (has no administration in time range)  alum & mag hydroxide-simeth (MAALOX/MYLANTA) 200-200-20 MG/5ML suspension 30 mL (has no administration in time range)  Initial Impression / Assessment and Plan / ED Course  I have reviewed the triage vital signs and the nursing notes.  Pertinent labs & imaging results that were available during my care of the patient were reviewed by me and considered in my medical decision making (see chart for details).  Clinical Course as of Feb 04 1921  Mon Feb 05, 2019  1555 Patient here possibly at the request of behavioral health for psych evaluation.  She is a little disjointed in her history.  I ordered her medical screening labs and when I went back to talk to the nurse the patient was not on her stretcher.  Not known to be on an IVC.   [MB]  [id number] The nurses inform security and they will attempt to find patient.   [MB]  [id number] Apparently patient was found outside and brought back to her bed.   [MB]  1630 I see a note from Dr. Dorina Hoyer behavioral health who heard about her treating team and is recommending that she be admitted to behavioral health.  I placed her under an IVC due to her brief elopement earlier.   [MB]  1828 Normal  Ethanol [EW]  1828 Normal except hemoglobin low  CBC with Diff(!) [EW]  1829 Normal  I-Stat beta hCG blood, ED [EW]  1829 Normal except glucose slightly elevated, total protein high  Comprehensive metabolic panel(!) [EW]    Clinical Course  User Index [EW] Daleen Bo, MD [MB] Hayden Rasmussen, MD        Final Clinical Impressions(s) / ED Diagnoses   Final diagnoses:  Psychosis, unspecified psychosis type [facility])    ED Discharge Orders    None       Hayden Rasmussen, MD 02/05/19 1924

## 2019-02-05 NOTE — ED Notes (Signed)
Pt to room 41. Pt oriented to unit.  Pt guarded, whisper, disoriented. Pt endorsed hearing voices. Pt denied SI/HI.

## 2019-02-05 NOTE — Care Management (Signed)
  Under Review:  [place]  [facility]  [place]

## 2019-02-05 NOTE — ED Notes (Signed)
TTS machine placed at bedside and pt instructed how to answer when they call.

## 2019-02-05 NOTE — Progress Notes (Signed)
Received [name] asleep in her room at shift change, she was awaken to talk with TTS.  Her thoughts are disorganized related to changing the subject and not answering any of the writers assessment questions. She was focused on finding her 49yo son who might be af a female's house. She was given fluids to drink. She refused all of her night time medications although I explained the lower doses of medication is related to more pills being administered.  She woke up at 0330 hrs and asked for food, then    crying out, screaming  and unable to communicate. Geodon was ordered for her, but she decided to take her scheduled night time medications. She drifted back  off to sleep after 0400 hrs.

## 2019-02-05 NOTE — Plan of Care (Signed)
Patient well-known to the psychiatry service, apparently has not been stable since shortly after her last release.  Spoke with her assertive community treatment team this afternoon who recommended admission.  We will be glad to accept her at behavioral health.

## 2019-02-05 NOTE — ED Notes (Addendum)
Security has escorted patient back to bed. Pt was outside by EMS bay.

## 2019-02-05 NOTE — ED Provider Notes (Signed)
4:45 PM-checkout from prior provider to evaluate after return labs, and initiation of treatment for psychiatric illness.  Of note, patient was here on 01/24/2019 and left prior to psychiatric evaluation.  Today, she has been involuntarily committed.  Clinical Course as of Feb 05 1828  Mon Feb 05, 2019  1555 Patient here possibly at the request of behavioral health for psych evaluation.  She is a little disjointed in her history.  I ordered her medical screening labs and when I went back to talk to the nurse the patient was not on her stretcher.  Not known to be on an IVC.   [MB]  [id number] The nurses inform security and they will attempt to find patient.   [MB]  [id number] Apparently patient was found outside and brought back to her bed.   [MB]  1630 I see a note from Dr. Dorina Hoyer behavioral health who heard about her treating team and is recommending that she be admitted to behavioral health.  I placed her under an IVC due to her brief elopement earlier.   [MB]  1828 Normal  Ethanol [EW]  1828 Normal except hemoglobin low  CBC with Diff(!) [EW]  1829 Normal  I-Stat beta hCG blood, ED [EW]  1829 Normal except glucose slightly elevated, total protein high  Comprehensive metabolic panel(!) [EW]    Clinical Course User Index [EW] Daleen Bo, MD [MB] Hayden Rasmussen, MD    Patient Vitals for the past 24 hrs:  BP Pulse Resp SpO2  02/05/19 1740 (!) 141/66 69 18 98 %      Medical Decision Making: Delusional behavior with history of bipolar disorder.  She is a poor historian, she reportedly recently saw Dr. [name], her psychiatrist for medication adjustments.  She is medically cleared for treatment by psychiatry.  CRITICAL CARE-no Performed by: Daleen Bo  Nursing Notes Reviewed/ Care Coordinated Applicable Imaging Reviewed Interpretation of Laboratory Data incorporated into ED treatment   TTS consult      Daleen Bo, MD 02/06/19 [phone]

## 2019-02-06 ENCOUNTER — Inpatient Hospital Stay (HOSPITAL_COMMUNITY)
Admission: AD | Admit: 2019-02-06 | Discharge: 2019-02-15 | DRG: 885 | Disposition: A | Discharge status: Home / Self Care | Payer: Medicaid Other | Source: Intra-hospital | Attending: Psychiatry | Admitting: Psychiatry

## 2019-02-06 ENCOUNTER — Encounter (HOSPITAL_COMMUNITY): Payer: Self-pay | Admitting: Registered Nurse

## 2019-02-06 DIAGNOSIS — N760 Acute vaginitis: Secondary | ICD-10-CM | POA: Diagnosis present

## 2019-02-06 DIAGNOSIS — D649 Anemia, unspecified: Secondary | ICD-10-CM | POA: Diagnosis present

## 2019-02-06 DIAGNOSIS — G2119 Other drug induced secondary parkinsonism: Secondary | ICD-10-CM | POA: Diagnosis present

## 2019-02-06 DIAGNOSIS — T434X5A Adverse effect of butyrophenone and thiothixene neuroleptics, initial encounter: Secondary | ICD-10-CM | POA: Diagnosis present

## 2019-02-06 DIAGNOSIS — F250 Schizoaffective disorder, bipolar type: Principal | ICD-10-CM | POA: Diagnosis present

## 2019-02-06 DIAGNOSIS — Y92239 Unspecified place in hospital as the place of occurrence of the external cause: Secondary | ICD-10-CM | POA: Diagnosis present

## 2019-02-06 DIAGNOSIS — Z9119 Patient's noncompliance with other medical treatment and regimen: Secondary | ICD-10-CM | POA: Diagnosis not present

## 2019-02-06 DIAGNOSIS — Z883 Allergy status to other anti-infective agents status: Secondary | ICD-10-CM | POA: Diagnosis not present

## 2019-02-06 DIAGNOSIS — G2571 Drug induced akathisia: Secondary | ICD-10-CM | POA: Diagnosis present

## 2019-02-06 DIAGNOSIS — F17210 Nicotine dependence, cigarettes, uncomplicated: Secondary | ICD-10-CM | POA: Diagnosis present

## 2019-02-06 DIAGNOSIS — Z833 Family history of diabetes mellitus: Secondary | ICD-10-CM | POA: Diagnosis not present

## 2019-02-06 DIAGNOSIS — F209 Schizophrenia, unspecified: Secondary | ICD-10-CM | POA: Diagnosis present

## 2019-02-06 MED ORDER — ZIPRASIDONE MESYLATE 20 MG IM SOLR: 20.0000 mg | Freq: Once | INTRAMUSCULAR | Status: DC

## 2019-02-06 NOTE — Plan of Care (Signed)
No need to transfer out to another facility- We have beds on [address] and need to accept here as I know her case well x 3 years [name]

## 2019-02-06 NOTE — BH Assessment (Signed)
[facility] Assessment Progress Note  Per Hampton Abbot, MD, this pt requires psychiatric hospitalization.  Heather, RN has assigned pt to [facility] Rm 507-1.  Pt presents under IVC and IVC documents have been faxed to [facility]  Pt's nurse, Nena Jordan, has been notified, and agrees to call report to [phone].  Pt is to be transported via [employer].   Jalene Mullet, [place] Coordinator [phone]

## 2019-02-06 NOTE — ED Notes (Signed)
Per Ronni RN patient is to arrive to [facility] after 9pm.

## 2019-02-06 NOTE — Consult Note (Signed)
Telepsych Consultation   Reason for Consult: Hallucinations Referring Physician: Daleen Bo, MD Location of Patient: [place] Location of Provider: [facility]  Patient Identification: [name] MRN:  [id number] Principal Diagnosis: Schizophrenia [facility]) Diagnosis:  Principal Problem:   Schizophrenia ([place]) Active Problems:   Schizoaffective disorder, bipolar type ([place])   Total Time spent with patient: 30 minutes  Subjective:   Per TTS Assessment Note; reviewed by this provider: [name] is an 48 y.o. female.  -Clinician reviewed note by Dr. Melina Copa.  Patricia Poitra Reidis a 48 y.o.female.She is a very poor historian. She is brought in by EMS from home. She said she is here to see Dr. [name] and needs her psychiatric medications adjusted. When she changes the subject and said she is here to file amissing persons report port.She denies any medical complaints. Per the nursing note it sounds like she is spoken with [facility] police regarding her son. On review of prior notes she was here about 11 days ago for disorganized thinking but ultimately eloped before getting an evaluation.  Patient has a flat and blunted affect.  She said that she called [employer] because she was trying to find out where her son was.  She says that she had been waiting on him.  She expressed a fear that something may have happened to him. Patient denies SI or HI.  She also denies any drug use. Patient says she hears voices that "show me different scenarios."  She describes "someone is controlling me."  She also says "cannot control thoughts because something is interfering."  Patient says that she takes her medications but it is unlikely she is taking them as directed.  She receives meds in a blister pack from her ACTT team.   Pt displays thought blocking and her speech is disjointed and rambling.  Eye contact is good.  She does appear to be responding to internal stimuli. Patient  has ACTT services from [facility].  Her psychiatrist is aware she is at [facility] and does says she needs inpatient care.  At this time there are no beds available at [facility].  Clinician did discuss patient care with Lindon Romp, FNP who recommends inpatient care.  Clinician did inform Dr. Edd Arbour at [facility] of disposition.  HPI:  [name], 48 y.o., female patient seen via tele psych by this provider, Dr. Dwyane Dee; and chart reviewed on 02/06/19.  On evaluation [name] is not a good historian.  She is not able to state why she was admitted to the hospital or why she was brought to the hospital.  She does not know who her outpatient psychiatric provider is stating Dr. [name].  Patient does admit to hearing voices stating "It's.  They say stuff about my son about Obama and about the Lord."  During evaluation [name] is alert/oriented to self; calm/cooperative; flat affect.  She does appear to be responding to internal stimuli.  Patient also appears to have some thought blocking.  Unable to get her sentences out.      Past Psychiatric History: Schizoaffective disorder bipolar type   Risk to Self: Suicidal Ideation: No Suicidal Intent: No Is patient at risk for suicide?: No Suicidal Plan?: No Access to Means: No What has been your use of drugs/alcohol within the last 12 months?: Denies How many times?: 1 Other Self Harm Risks: None Triggers for Past Attempts: Hallucinations Intentional Self Injurious Behavior: None Risk to Others: Homicidal Ideation: No Thoughts of Harm to Others:  No Current Homicidal Intent: No Current Homicidal Plan: No Access to Homicidal Means: No Identified Victim: No one History of harm to others?: No Assessment of Violence: None Noted Violent Behavior Description: None reported Does patient have access to weapons?: No Criminal Charges Pending?: No Does patient have a court date: No Prior Inpatient Therapy: Prior Inpatient Therapy:  Yes Prior Therapy Dates: July 22-31, 2020; 01/2017 Prior Therapy Facilty/Provider(s): [facility] Reason for Treatment: schizoaffective d/o Prior Outpatient Therapy: Prior Outpatient Therapy: Yes Prior Therapy Dates: unclear Prior Therapy Facilty/Provider(s): [facility] Reason for Treatment: ACTT services Does patient have an ACCT team?: Yes Does patient have Intensive In-House Services?  : No Does patient have [facility] services? : No Does patient have P4CC services?: No  Past Medical History:  Past Medical History:  Diagnosis Date  . Arthritis   . Bipolar 1 disorder ([place])   . Bronchitis   . Depression   . Hemorrhoid   . Schizoaffective disorder ([place])   . Schizophrenia ([place])    Pt denies and reports it is schizoaffective disorder.   . Transfusion history     Past Surgical History:  Procedure Laterality Date  . CESAREAN SECTION     Family History:  Family History  Problem Relation Age of Onset  . Diabetes type II Other    Family Psychiatric  History: Unaware Social History:  Social History   Substance and Sexual Activity  Alcohol Use No     Social History   Substance and Sexual Activity  Drug Use No  . Types: Marijuana   Comment: Former user    Social History   Socioeconomic History  . Marital status: Legally Separated    Spouse name: Not on file  . Number of children: Not on file  . Years of education: Not on file  . Highest education level: Not on file  Occupational History  . Not on file  Social Needs  . Financial resource strain: Not on file  . Food insecurity    Worry: Not on file    Inability: Not on file  . Transportation needs    Medical: Not on file    Non-medical: Not on file  Tobacco Use  . Smoking status: Current Every Day Smoker    Packs/day: 0.50    Years: 17.00    Pack years: 8.50    Types: Cigarettes  . Smokeless tobacco: Never Used  Substance and Sexual Activity  . Alcohol use: No  . Drug use: No    Types: Marijuana     Comment: Former user  . Sexual activity: Never  Lifestyle  . Physical activity    Days per week: Not on file    Minutes per session: Not on file  . Stress: Not on file  Relationships  . Social Herbalist on phone: Not on file    Gets together: Not on file    Attends religious service: Not on file    Active member of club or organization: Not on file    Attends meetings of clubs or organizations: Not on file    Relationship status: Not on file  Other Topics Concern  . Not on file  Social History Narrative   ** Merged History Encounter **       Additional Social History:    Allergies:   Allergies  Allergen Reactions  . Anette Guarneri [Lurasidone Hcl] Nausea And Vomiting and Other (See Comments)    Reaction:  Shaking   . Abilify [Aripiprazole] Other (  See Comments)    shaking  . Flagyl [Metronidazole Hcl] Nausea And Vomiting  . Prolixin [Fluphenazine] Other (See Comments)    shaking  . Risperidone And Related Other (See Comments)    shaking  . Seroquel [Quetiapine Fumarate]     Patient states she had a syncopal episode after taking medication.   . Sulfa Antibiotics Nausea And Vomiting    Labs:  Results for orders placed or performed during the hospital encounter of 02/05/19 (from the past 48 hour(s))  Comprehensive metabolic panel     Status: Abnormal   Collection Time: 02/05/19  4:40 PM  Result Value Ref Range   Sodium 139 135 - 145 mmol/L   Potassium 3.8 3.5 - 5.1 mmol/L   Chloride 104 98 - 111 mmol/L   CO2 25 22 - 32 mmol/L   Glucose, Bld 103 (H) 70 - 99 mg/dL   BUN 8 6 - 20 mg/dL   Creatinine, Ser 0.93 0.44 - 1.00 mg/dL   Calcium 8.9 8.9 - 10.3 mg/dL   Total Protein 8.4 (H) 6.5 - 8.1 g/dL   Albumin 4.1 3.5 - 5.0 g/dL   AST 17 15 - 41 U/L   ALT 12 0 - 44 U/L   Alkaline Phosphatase 77 38 - 126 U/L   Total Bilirubin 0.7 0.3 - 1.2 mg/dL   GFR calc non Af Amer >60 >60 mL/min   GFR calc Af Amer >60 >60 mL/min   Anion gap 10 5 - 15    Comment: Performed  at [facility], [place] [address], [place], [place] [address]  Ethanol     Status: None   Collection Time: 02/05/19  4:40 PM  Result Value Ref Range   Alcohol, Ethyl (B) <10 <10 mg/dL    Comment: (NOTE) Lowest detectable limit for serum alcohol is 10 mg/dL. For medical purposes only. Performed at [facility], [place] [address], [place], [place] [address]   CBC with Diff     Status: Abnormal   Collection Time: 02/05/19  4:40 PM  Result Value Ref Range   WBC 10.1 4.0 - 10.5 K/uL   RBC 3.66 (L) 3.87 - 5.11 MIL/uL   Hemoglobin 9.9 (L) 12.0 - 15.0 g/dL   HCT 32.1 (L) 36.0 - 46.0 %   MCV 87.7 80.0 - 100.0 fL   MCH 27.0 26.0 - 34.0 pg   MCHC 30.8 30.0 - 36.0 g/dL   RDW 18.3 (H) 11.5 - 15.5 %   Platelets 285 150 - 400 K/uL   nRBC 0.0 0.0 - 0.2 %   Neutrophils Relative % 63 %   Neutro Abs 6.5 1.7 - 7.7 K/uL   Lymphocytes Relative 29 %   Lymphs Abs 2.9 0.7 - 4.0 K/uL   Monocytes Relative 6 %   Monocytes Absolute 0.6 0.1 - 1.0 K/uL   Eosinophils Relative 1 %   Eosinophils Absolute 0.1 0.0 - 0.5 K/uL   Basophils Relative 1 %   Basophils Absolute 0.1 0.0 - 0.1 K/uL   Immature Granulocytes 0 %   Abs Immature Granulocytes 0.02 0.00 - 0.07 K/uL    Comment: Performed at [facility], [place] [address] [place], [place] [address]  I-Stat beta hCG blood, ED     Status: None   Collection Time: 02/05/19  4:45 PM  Result Value Ref Range   I-stat hCG, quantitative <5.0 <5 mIU/mL   Comment 3            Comment:  GEST. AGE      CONC.  (mIU/mL)   <=1 WEEK        5 - 50     2 WEEKS       50 - 500     3 WEEKS       100 - 10,000     4 WEEKS     1,000 - 30,000        FEMALE AND NON-PREGNANT FEMALE:     LESS THAN 5 mIU/mL   SARS Coronavirus 2 by RT PCR (hospital order, performed in [place] hospital lab) Nasopharyngeal Nasopharyngeal Swab     Status: None   Collection Time: 02/05/19  6:26 PM   Specimen: Nasopharyngeal Swab  Result Value Ref  Range   SARS Coronavirus 2 NEGATIVE NEGATIVE    Comment: (NOTE) If result is NEGATIVE SARS-CoV-2 target nucleic acids are NOT DETECTED. The SARS-CoV-2 RNA is generally detectable in upper and lower  respiratory specimens during the acute phase of infection. The lowest  concentration of SARS-CoV-2 viral copies this assay can detect is 250  copies / mL. A negative result does not preclude SARS-CoV-2 infection  and should not be used as the sole basis for treatment or other  patient management decisions.  A negative result may occur with  improper specimen collection / handling, submission of specimen other  than nasopharyngeal swab, presence of viral mutation(s) within the  areas targeted by this assay, and inadequate number of viral copies  (<250 copies / mL). A negative result must be combined with clinical  observations, patient history, and epidemiological information. If result is POSITIVE SARS-CoV-2 target nucleic acids are DETECTED. The SARS-CoV-2 RNA is generally detectable in upper and lower  respiratory specimens dur ing the acute phase of infection.  Positive  results are indicative of active infection with SARS-CoV-2.  Clinical  correlation with patient history and other diagnostic information is  necessary to determine patient infection status.  Positive results do  not rule out bacterial infection or co-infection with other viruses. If result is PRESUMPTIVE POSTIVE SARS-CoV-2 nucleic acids MAY BE PRESENT.   A presumptive positive result was obtained on the submitted specimen  and confirmed on repeat testing.  While 2019 novel coronavirus  (SARS-CoV-2) nucleic acids may be present in the submitted sample  additional confirmatory testing may be necessary for epidemiological  and / or clinical management purposes  to differentiate between  SARS-CoV-2 and other Sarbecovirus currently known to infect humans.  If clinically indicated additional testing with an alternate test   methodology [phone]) is advised. The SARS-CoV-2 RNA is generally  detectable in upper and lower respiratory sp ecimens during the acute  phase of infection. The expected result is Negative. Fact Sheet for Patients:  StrictlyIdeas.no Fact Sheet for Healthcare Providers: BankingDealers.co.za This test is not yet approved or cleared by the [place] FDA and has been authorized for detection and/or diagnosis of SARS-CoV-2 by FDA under an Emergency Use Authorization (EUA).  This EUA will remain in effect (meaning this test can be used) for the duration of the COVID-19 declaration under Section 564(b)(1) of the Act, 21 U.S.C. section 360bbb-3(b)(1), unless the authorization is terminated or revoked sooner. Performed at [facility], [place] [address], [place], [place] [address]     Medications:  Current Facility-Administered Medications  Medication Dose Route Frequency Provider Last Rate Last Dose  . alum & mag hydroxide-simeth (MAALOX/MYLANTA) 200-200-20 MG/5ML suspension 30 mL  30 mL Oral Q6H PRN Daleen Bo,  MD      . benztropine (COGENTIN) tablet 1 mg  1 mg Oral BID Hayden Rasmussen, MD   1 mg at 02/06/19 1134  . carbamazepine (TEGRETOL XR) 12 hr tablet 400 mg  400 mg Oral QHS Hayden Rasmussen, MD   400 mg at 02/06/19 0406  . risperiDONE (RISPERDAL) tablet 6 mg  6 mg Oral QHS Hayden Rasmussen, MD   6 mg at 02/06/19 0407  . traZODone (DESYREL) tablet 150 mg  150 mg Oral QHS Hayden Rasmussen, MD      . ziprasidone (GEODON) injection 20 mg  20 mg Intramuscular Once Veryl Speak, MD      . zolpidem (AMBIEN) tablet 5 mg  5 mg Oral QHS PRN Daleen Bo, MD       Current Outpatient Medications  Medication Sig Dispense Refill  . baclofen (LIORESAL) 10 MG tablet Take 10 mg by mouth 3 (three) times daily as needed for muscle spasms.    . benztropine (COGENTIN) 1 MG tablet Take 1 tablet (1 mg total) by mouth 2 (two)  times daily. 60 tablet 11  . carbamazepine (TEGRETOL-XR) 200 MG 12 hr tablet Take 2 tablets (400 mg total) by mouth at bedtime. 60 tablet 11  . Lumateperone Tosylate (CAPLYTA) 42 MG CAPS Take 42 mg by mouth daily.    . mirtazapine (REMERON) 15 MG tablet Take 15 mg by mouth at bedtime.    . Multiple Vitamin (MULTIVITAMIN WITH MINERALS) TABS tablet Take 1 tablet by mouth daily.    Marland Kitchen perphenazine (TRILAFON) 4 MG tablet Take 4 mg by mouth at bedtime.    . risperiDONE (RISPERDAL) 3 MG tablet Take 2 tablets (6 mg total) by mouth at bedtime. (Patient not taking: Reported on 02/06/2019) 60 tablet 11  . traZODone (DESYREL) 150 MG tablet Take 1 tablet (150 mg total) by mouth at bedtime. (Patient not taking: Reported on 02/06/2019) 30 tablet 11    Musculoskeletal: Strength & Muscle Tone: within normal limits Gait & Station: normal Patient leans: N/A  Psychiatric Specialty Exam: Physical Exam  Nursing note and vitals reviewed. Constitutional: She appears well-nourished.  Neck: Normal range of motion.  Respiratory: Effort normal.  Musculoskeletal: Normal range of motion.  Neurological: She is alert.  Psychiatric: Her speech is delayed. She is actively hallucinating. She expresses impulsivity. She exhibits a depressed mood. She exhibits abnormal recent memory and abnormal remote memory.    Review of Systems  Psychiatric/Behavioral: Positive for depression and hallucinations.  All other systems reviewed and are negative.   Blood pressure (!) 141/66, pulse 69, resp. rate 18, SpO2 98 %.There is no height or weight on file to calculate BMI.  General Appearance: Casual  Eye Contact:  Fair  Speech:  Blocked and Slow  Volume:  Decreased  Mood:  Depressed  Affect:  Flat  Thought Process:  Disorganized  Orientation:  Other:  Oriented to person  Thought Content:  Hallucinations: Auditory  Suicidal Thoughts:  No  Homicidal Thoughts:  No  Memory:  Immediate;   Poor Recent;   Poor Remote;   Poor   Judgement:  Impaired  Insight:  Lacking  Psychomotor Activity:  Decreased  Concentration:  Concentration: Poor and Attention Span: Poor  Recall:  Poor  Fund of Knowledge:  Poor  Language:  Fair  Akathisia:  No  Handed:  Right  AIMS (if indicated):     Assets:  Desire for Improvement Housing Social Support  ADL's:  Intact  Cognition:  WNL  Sleep:  Treatment Plan Summary: Plan Inpatient psychiatric treatment  Disposition: Recommend psychiatric Inpatient admission when medically cleared.  This service was provided via telemedicine using a 2-way, interactive audio and video technology.  Names of all persons participating in this telemedicine service and their role in this encounter. Name: Earleen [place] Role: NP  Name: Dr. Dwyane Dee Role: Psychiatrist  Name: Joyice Faster Role: Patient  Name: Role:    Earleen [place], NP 02/06/2019 3:24 PM

## 2019-02-06 NOTE — ED Notes (Signed)
Pt refused vitals. Will attempt later.

## 2019-02-06 NOTE — Discharge Summary (Signed)
  Patient to be transferred to [facility] for inpatient psychiatric treatment 

## 2019-02-06 NOTE — Progress Notes (Signed)
Received [name] this PM at shift change,later she showered. [place] called at 2128 to give report, no answer. Report given to nurse Legrand Como at [facility] 2140 hrs.. She was medicated with her nighttime medications. [employer] was notified  and transported at 2210 hours with her personal belongings.

## 2019-02-07 ENCOUNTER — Other Ambulatory Visit: Payer: Self-pay

## 2019-02-07 ENCOUNTER — Encounter (HOSPITAL_COMMUNITY): Payer: Self-pay

## 2019-02-07 DIAGNOSIS — F250 Schizoaffective disorder, bipolar type: Secondary | ICD-10-CM | POA: Diagnosis not present

## 2019-02-07 DIAGNOSIS — F209 Schizophrenia, unspecified: Secondary | ICD-10-CM | POA: Diagnosis present

## 2019-02-07 MED ORDER — HALOPERIDOL 5 MG PO TABS
5.0000 mg | ORAL_TABLET | Freq: Three times a day (TID) | ORAL | Status: DC
  Administered 2019-02-07: 5 mg via ORAL
  Filled 2019-02-07 (×4): qty 1

## 2019-02-07 MED ORDER — ZOLPIDEM TARTRATE 5 MG PO TABS
10.0000 mg | ORAL_TABLET | Freq: Every evening | ORAL | Status: DC | PRN
  Administered 2019-02-13 (×2): 10 mg via ORAL
  Filled 2019-02-07 (×2): qty 2

## 2019-02-07 MED ORDER — CARBAMAZEPINE ER 400 MG PO TB12
400.0000 mg | ORAL_TABLET | Freq: Every day | ORAL | Status: DC
  Administered 2019-02-07 – 2019-02-14 (×7): 400 mg via ORAL
  Filled 2019-02-07 (×2): qty 1
  Filled 2019-02-07: qty 2
  Filled 2019-02-07 (×8): qty 1

## 2019-02-07 MED ORDER — HALOPERIDOL 5 MG PO TABS
ORAL_TABLET | ORAL | Status: AC
  Filled 2019-02-07: qty 1

## 2019-02-07 MED ORDER — ZOLPIDEM TARTRATE 5 MG PO TABS: 5.0000 mg | ORAL_TABLET | Freq: Every evening | ORAL | Status: DC | PRN

## 2019-02-07 MED ORDER — ALUM & MAG HYDROXIDE-SIMETH 200-200-20 MG/5ML PO SUSP: 30.0000 mL | Freq: Four times a day (QID) | ORAL | Status: DC | PRN

## 2019-02-07 MED ORDER — HALOPERIDOL 5 MG PO TABS
5.0000 mg | ORAL_TABLET | Freq: Two times a day (BID) | ORAL | Status: DC
  Administered 2019-02-07 – 2019-02-09 (×4): 5 mg via ORAL
  Filled 2019-02-07 (×7): qty 1

## 2019-02-07 MED ORDER — BENZTROPINE MESYLATE 1 MG PO TABS
1.0000 mg | ORAL_TABLET | Freq: Two times a day (BID) | ORAL | Status: DC
  Administered 2019-02-07 – 2019-02-10 (×7): 1 mg via ORAL
  Filled 2019-02-07 (×14): qty 1

## 2019-02-07 MED ORDER — TRAZODONE HCL 150 MG PO TABS
150.0000 mg | ORAL_TABLET | Freq: Every day | ORAL | Status: DC
  Filled 2019-02-07 (×11): qty 1

## 2019-02-07 MED ORDER — RISPERIDONE 3 MG PO TABS
6.0000 mg | ORAL_TABLET | Freq: Every day | ORAL | Status: DC
  Filled 2019-02-07: qty 2

## 2019-02-07 MED ORDER — HALOPERIDOL 5 MG PO TABS
10.0000 mg | ORAL_TABLET | Freq: Every day | ORAL | Status: DC
  Administered 2019-02-07 – 2019-02-08 (×2): 10 mg via ORAL
  Filled 2019-02-07 (×3): qty 2

## 2019-02-07 NOTE — Progress Notes (Signed)
Recreation Therapy Notes  INPATIENT RECREATION THERAPY ASSESSMENT  Patient Details Name: [name] MRN: [id number] DOB: 12-06-1970 Today's Date: 02/07/2019       Information Obtained From: Patient  Able to Participate in Assessment/Interview: Yes  Patient Presentation: Alert  Reason for Admission (Per Patient): Other (Comments)(Hearing voices- threatening talk)  Patient Stressors: Family(Pt stated son has been missing and no one has gotten in touch with him)  Coping Skills:   Isolation, Arguments, Music, Exercise, Meditate, Talk, Prayer, Avoidance, Read, Hot Bath/Shower  Leisure Interests (2+):  Exercise - Walking, Individual - Reading, Individual - Other (Comment)(Squats; Crunches)  Frequency of Recreation/Participation: Other (Comment)(Daily)  Awareness of Community Resources:  No  Expressed Interest in [place]: No  County of Residence:  [place]  Patient Main Form of Transportation: Public Transportation(Pt also stated she uses the bus and the taxi.)  Patient Strengths:  Good listener; Make people laugh  Patient Identified Areas of Improvement:  Isolating self; Learn to save/not spend as much  Patient Goal for Hospitalization:  "get medicine straightened out and stop hearing voices"  Current SI (including self-harm):  No  Current HI:  No  Current AVH: No  Staff Intervention Plan: Group Attendance, Collaborate with Interdisciplinary Treatment Team  Consent to Intern Participation: N/A    [name], LRT/CTRS  [name] A 02/07/2019, 1:23 PM

## 2019-02-07 NOTE — [facility] Group Notes (Signed)
Occupational Therapy Group Note  Date:  02/07/2019 Time:  3:03 PM  Group Topic/Focus:  Relaxation  Participation Level:  Active  Participation Quality:  Appropriate  Affect:  Blunted  Cognitive:  Alert  Insight: Improving  Engagement in Group:  Engaged  Modes of Intervention:  Activity, Discussion, Education and Socialization  Additional Comments:    S: I need to do more of this  O: Progressive muscle relaxation group completed to facilitate relaxation response and grow coping skills. Pt to follow PMR script and then share what songs make them feel relaxed for the group to listen to.  A: Pt engaged and motivated to participate. She states that she felt much more stress once engaging in the exercise and that she would need to do more of these types of activities. Affect improved by end of session.  P: OT group will be x1 per week while pt inpatient.   Zenovia Jarred, MSOT, OTR/L Behavioral Health OT/ Acute Relief OT PHP Office: [phone]  Zenovia Jarred 02/07/2019, 3:03 PM
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
## 2019-02-07 NOTE — Tx Team (Signed)
Initial Treatment Plan 02/07/2019 2:33 AM [name] [id number]    PATIENT STRESSORS: Marital or family conflict Medication change or noncompliance   PATIENT STRENGTHS: General fund of knowledge Motivation for treatment/growth   PATIENT IDENTIFIED PROBLEMS: Psychosis  "medicines right, trying to get in touch with my son"                   DISCHARGE CRITERIA:  Improved stabilization in mood, thinking, and/or behavior Verbal commitment to aftercare and medication compliance  PRELIMINARY DISCHARGE PLAN: Attend aftercare/continuing care group Attend 12-step recovery group  PATIENT/FAMILY INVOLVEMENT: This treatment plan has been presented to and reviewed with the patient, [name].  The patient and family have been given the opportunity to ask questions and make suggestions.  Providence Crosby, RN 02/07/2019, 2:33 AM

## 2019-02-07 NOTE — Progress Notes (Signed)
Recreation Therapy Notes  Date: 10.21.20 Time: 1000 Location: 500 [address] Dayroom   Group Topic: Communication, Team Building, Problem Solving  Goal Area(s) Addresses:  Patient will effectively work with peer towards shared goal.  Patient will identify skills used to make activity successful.  Patient will identify how skills used during activity can be used to reach post d/c goals.   Behavioral Response: None  Intervention: STEM Activity  Activity:  Cup [employer].  In groups of 4, patients were given 10 red cups and rubber band with four strings attached to it.  Patients were to use the rubber band contraption to sit the cups upright and then stack them like a pyramid.  Education: Education officer, community, Discharge Planning   Education Outcome: Acknowledges education/In group clarification offered/Needs additional education.   Clinical Observations/Feedback:  Pt did not participate.  Pt observed as peers completed the activity.     [name], LRT/CTRS    [name] A 02/07/2019 12:05 PM

## 2019-02-07 NOTE — Progress Notes (Signed)
Psychoeducational Group Note  Date:  02/07/2019 Time:  2032  Group Topic/Focus:  Wrap-Up Group:   The focus of this group is to help patients review their daily goal of treatment and discuss progress on daily workbooks.  Participation Level: Did Not Attend  Participation Quality:  Not Applicable  Affect:  Not Applicable  Cognitive:  Not Applicable  Insight:  Not Applicable  Engagement in Group: Not Applicable  Additional Comments:  The patient did not attend group this evening.   Archie Balboa S 02/07/2019, 8:32 PM
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
## 2019-02-07 NOTE — Tx Team (Signed)
Interdisciplinary Treatment and Diagnostic Plan Update  02/07/2019 Time of Session: 1:00pm   [name] MRN: [id number]  Principal Diagnosis: <principal problem not specified>  Secondary Diagnoses: Active Problems:   Schizophrenia ([place])   Current Medications:  Current Facility-Administered Medications  Medication Dose Route Frequency Provider Last Rate Last Dose  . alum & mag hydroxide-simeth (MAALOX/MYLANTA) 200-200-20 MG/5ML suspension 30 mL  30 mL Oral Q6H PRN Rankin, Shuvon B, NP      . benztropine (COGENTIN) tablet 1 mg  1 mg Oral BID Rankin, Shuvon B, NP   1 mg at 02/07/19 0827  . carbamazepine (TEGRETOL XR) 12 hr tablet 400 mg  400 mg Oral QHS Rankin, Shuvon B, NP      . haloperidol (HALDOL) 5 MG tablet           . haloperidol (HALDOL) tablet 10 mg  10 mg Oral QHS [name], MD      . haloperidol (HALDOL) tablet 5 mg  5 mg Oral BID [name], MD      . traZODone (DESYREL) tablet 150 mg  150 mg Oral QHS Rankin, Shuvon B, NP      . zolpidem (AMBIEN) tablet 10 mg  10 mg Oral QHS PRN [name], MD       PTA Medications: Medications Prior to Admission  Medication Sig Dispense Refill Last Dose  . baclofen (LIORESAL) 10 MG tablet Take 10 mg by mouth 3 (three) times daily as needed for muscle spasms.     . benztropine (COGENTIN) 1 MG tablet Take 1 tablet (1 mg total) by mouth 2 (two) times daily. 60 tablet 11   . carbamazepine (TEGRETOL-XR) 200 MG 12 hr tablet Take 2 tablets (400 mg total) by mouth at bedtime. 60 tablet 11   . Lumateperone Tosylate (CAPLYTA) 42 MG CAPS Take 42 mg by mouth daily.     . mirtazapine (REMERON) 15 MG tablet Take 15 mg by mouth at bedtime.     . Multiple Vitamin (MULTIVITAMIN WITH MINERALS) TABS tablet Take 1 tablet by mouth daily.     Marland Kitchen perphenazine (TRILAFON) 4 MG tablet Take 4 mg by mouth at bedtime.     . risperiDONE (RISPERDAL) 3 MG tablet Take 2 tablets (6 mg total) by mouth at bedtime. (Patient not taking: Reported on 02/06/2019) 60  tablet 11   . traZODone (DESYREL) 150 MG tablet Take 1 tablet (150 mg total) by mouth at bedtime. (Patient not taking: Reported on 02/06/2019) 30 tablet 11     Patient Stressors: Marital or family conflict Medication change or noncompliance  Patient Strengths: Technical sales engineer for treatment/growth  Treatment Modalities: Medication Management, Group therapy, Case management,  1 to 1 session with clinician, Psychoeducation, Recreational therapy.   Physician Treatment Plan for Primary Diagnosis: <principal problem not specified> Long Term Goal(s): Improvement in symptoms so as ready for discharge Improvement in symptoms so as ready for discharge   Short Term Goals: Ability to identify and develop effective coping behaviors will improve Ability to maintain clinical measurements within normal limits will improve Compliance with prescribed medications will improve Ability to verbalize feelings will improve Ability to disclose and discuss suicidal ideas Ability to demonstrate self-control will improve Ability to identify and develop effective coping behaviors will improve Ability to maintain clinical measurements within normal limits will improve  Medication Management: Evaluate patient's response, side effects, and tolerance of medication regimen.  Therapeutic Interventions: 1 to 1 sessions, Unit Group sessions and Medication administration.  Evaluation of  Outcomes: Progressing  Physician Treatment Plan for Secondary Diagnosis: Active Problems:   Schizophrenia ([place])  Long Term Goal(s): Improvement in symptoms so as ready for discharge Improvement in symptoms so as ready for discharge   Short Term Goals: Ability to identify and develop effective coping behaviors will improve Ability to maintain clinical measurements within normal limits will improve Compliance with prescribed medications will improve Ability to verbalize feelings will improve Ability to  disclose and discuss suicidal ideas Ability to demonstrate self-control will improve Ability to identify and develop effective coping behaviors will improve Ability to maintain clinical measurements within normal limits will improve     Medication Management: Evaluate patient's response, side effects, and tolerance of medication regimen.  Therapeutic Interventions: 1 to 1 sessions, Unit Group sessions and Medication administration.  Evaluation of Outcomes: Progressing   RN Treatment Plan for Primary Diagnosis: <principal problem not specified> Long Term Goal(s): Knowledge of disease and therapeutic regimen to maintain health will improve  Short Term Goals: Ability to participate in decision making will improve, Ability to verbalize feelings will improve, Ability to disclose and discuss suicidal ideas, Ability to identify and develop effective coping behaviors will improve and Compliance with prescribed medications will improve  Medication Management: RN will administer medications as ordered by provider, will assess and evaluate patient's response and provide education to patient for prescribed medication. RN will report any adverse and/or side effects to prescribing provider.  Therapeutic Interventions: 1 on 1 counseling sessions, Psychoeducation, Medication administration, Evaluate responses to treatment, Monitor vital signs and CBGs as ordered, Perform/monitor CIWA, COWS, AIMS and Fall Risk screenings as ordered, Perform wound care treatments as ordered.  Evaluation of Outcomes: Progressing   LCSW Treatment Plan for Primary Diagnosis: <principal problem not specified> Long Term Goal(s): Safe transition to appropriate next level of care at discharge, Engage patient in therapeutic group addressing interpersonal concerns.  Short Term Goals: Engage patient in aftercare planning with referrals and resources and Increase skills for wellness and recovery  Therapeutic Interventions: Assess  for all discharge needs, 1 to 1 time with Social worker, Explore available resources and support systems, Assess for adequacy in community support network, Educate family and significant other(s) on suicide prevention, Complete Psychosocial Assessment, Interpersonal group therapy.  Evaluation of Outcomes: Progressing   Progress in Treatment: Attending groups: No. Participating in groups: No. Taking medication as prescribed: Yes. Toleration medication: Yes. Family/Significant other contact made: No, will contact:  will contact if given consent to contact Patient understands diagnosis: No. Discussing patient identified problems/goals with staff: Yes. Medical problems stabilized or resolved: Yes. Denies suicidal/homicidal ideation: Yes. Issues/concerns per patient self-inventory: No. Other:   New problem(s) identified: No, Describe:  None  New Short Term/Long Term Goal(s): Medication stabilization, elimination of SI thoughts, and development of a comprehensive mental wellness plan.   Patient Goals:  "needing housing due to water and molding"  Discharge Plan or Barriers: CSW will continue to follow up for appropriate referrals and possible discharge planning  Reason for Continuation of Hospitalization: Delusions  Depression Hallucinations Medication stabilization  Estimated Length of Stay: 3-5 days   Attendees: Patient: [name] 02/07/2019   Physician: Dr. [name], MD 02/07/2019   Nursing: Vladimir Faster, RN 02/07/2019   RN Care Manager: 02/07/2019   Social Worker: Ardelle Anton, LCSW  02/07/2019   Recreational Therapist:  02/07/2019   MSW Intern: Ovidio Kin 02/07/2019   Other:  02/07/2019   Other: 02/07/2019     Scribe for Treatment Team: Trecia Rogers, LCSW 02/07/2019 1:25  PM

## 2019-02-08 DIAGNOSIS — F250 Schizoaffective disorder, bipolar type: Secondary | ICD-10-CM | POA: Diagnosis not present

## 2019-02-08 MED ORDER — CARBAMAZEPINE ER 200 MG PO TB12
200.0000 mg | ORAL_TABLET | Freq: Every day | ORAL | Status: DC
  Administered 2019-02-08 – 2019-02-15 (×8): 200 mg via ORAL
  Filled 2019-02-08 (×12): qty 1

## 2019-02-08 NOTE — Progress Notes (Signed)
Psychoeducational Group Note  Date:  02/08/2019 Time:  2041  Group Topic/Focus:  Wrap-Up Group:   The focus of this group is to help patients review their daily goal of treatment and discuss progress on daily workbooks.  Participation Level: Did Not Attend  Participation Quality:  Not Applicable  Affect:  Not Applicable  Cognitive:  Not Applicable  Insight:  Not Applicable  Engagement in Group: Not Applicable  Additional Comments:  The patient did not attend group this evening.   Archie Balboa S 02/08/2019, 8:41 PM

## 2019-02-08 NOTE — Progress Notes (Signed)
D: Pt continues to appear to be responding to internal stimuli this evening. Pt had minimal interaction on the unit this evening.  A: Pt was offered support and encouragement. Pt was given scheduled medications. Pt was encourage to attend groups. Q 15 minute checks were done for safety.  R: safety maintained on unit.

## 2019-02-08 NOTE — Progress Notes (Signed)
[facility] MD Progress Note  02/08/2019 8:23 AM [name]  MRN:  [id number] Subjective:    Patient did sleep by her report she is compliant with her medications and reports a near complete resolution and marked reduction in auditory hallucinations since the addition of haloperidol at her request.  No EPS or TD.  Also on carbamazepine as she has had some success with this agent in the past regarding mood stabilization.  Denies thoughts of harming self or others.  Remains focused on the whereabouts and safety of her son and again the piece of truth in this concern is that he may indeed be involved in gang activity and he is not in touch with her at the present time. Also remained focused on the safety of her apartment Principal Problem: Exacerbation of bipolar/schizoaffective type condition Diagnosis: Active Problems:   Schizophrenia ([place])  Total Time spent with patient: 20 minutes  Past Psychiatric History: Numerous past medication trials with limited success as far as long-term stability  Past Medical History:  Past Medical History:  Diagnosis Date  . Arthritis   . Bipolar 1 disorder ([place])   . Bronchitis   . Depression   . Hemorrhoid   . Schizoaffective disorder ([place])   . Schizophrenia ([place])    Pt denies and reports it is schizoaffective disorder.   . Transfusion history     Past Surgical History:  Procedure Laterality Date  . CESAREAN SECTION     Family History:  Family History  Problem Relation Age of Onset  . Diabetes type II Other    Family Psychiatric  History: No new data Social History:  Social History   Substance and Sexual Activity  Alcohol Use No     Social History   Substance and Sexual Activity  Drug Use No  . Types: Marijuana   Comment: Former user    Social History   Socioeconomic History  . Marital status: Legally Separated    Spouse name: Not on file  . Number of children: Not on file  . Years of education: Not on file  . Highest  education level: Not on file  Occupational History  . Not on file  Social Needs  . Financial resource strain: Not on file  . Food insecurity    Worry: Not on file    Inability: Not on file  . Transportation needs    Medical: Not on file    Non-medical: Not on file  Tobacco Use  . Smoking status: Current Every Day Smoker    Packs/day: 0.50    Years: 17.00    Pack years: 8.50    Types: Cigarettes  . Smokeless tobacco: Never Used  Substance and Sexual Activity  . Alcohol use: No  . Drug use: No    Types: Marijuana    Comment: Former user  . Sexual activity: Never  Lifestyle  . Physical activity    Days per week: Not on file    Minutes per session: Not on file  . Stress: Not on file  Relationships  . Social Herbalist on phone: Not on file    Gets together: Not on file    Attends religious service: Not on file    Active member of club or organization: Not on file    Attends meetings of clubs or organizations: Not on file    Relationship status: Not on file  Other Topics Concern  . Not on file  Social History Narrative   **  Merged History Encounter **       Additional Social History:                         Sleep: Good  Appetite:  Good  Current Medications: Current Facility-Administered Medications  Medication Dose Route Frequency Provider Last Rate Last Dose  . alum & mag hydroxide-simeth (MAALOX/MYLANTA) 200-200-20 MG/5ML suspension 30 mL  30 mL Oral Q6H PRN Rankin, Shuvon B, NP      . benztropine (COGENTIN) tablet 1 mg  1 mg Oral BID Rankin, Shuvon B, NP   1 mg at 02/08/19 0821  . carbamazepine (TEGRETOL XR) 12 hr tablet 200 mg  200 mg Oral Daily [name], MD      . carbamazepine (TEGRETOL XR) 12 hr tablet 400 mg  400 mg Oral QHS Rankin, Shuvon B, NP   400 mg at 02/07/19 2116  . haloperidol (HALDOL) tablet 10 mg  10 mg Oral QHS [name], MD   10 mg at 02/07/19 2117  . haloperidol (HALDOL) tablet 5 mg  5 mg Oral BID [name], MD    5 mg at 02/08/19 [id number]  . traZODone (DESYREL) tablet 150 mg  150 mg Oral QHS Rankin, Shuvon B, NP      . zolpidem (AMBIEN) tablet 10 mg  10 mg Oral QHS PRN [name], MD        Lab Results: No results found for this or any previous visit (from the past 48 hour(s)).  Blood Alcohol level:  Lab Results  Component Value Date   ETH <10 02/05/2019   ETH <10 [id number]    Metabolic Disorder Labs: Lab Results  Component Value Date   HGBA1C 6.3 (H) 01/30/2017   MPG 134.11 01/30/2017   MPG 128 05/11/2015   No results found for: PROLACTIN Lab Results  Component Value Date   CHOL 157 01/29/2017   TRIG 119 01/29/2017   HDL 40 (L) 01/29/2017   CHOLHDL 3.9 01/29/2017   VLDL 24 01/29/2017   LDLCALC 93 01/29/2017   LDLCALC 97 05/11/2015    Musculoskeletal: Strength & Muscle Tone: within normal limits Gait & Station: normal Patient leans: N/A  Psychiatric Specialty Exam: Physical Exam  ROS  Blood pressure 112/76, pulse 95, temperature 98.5 F (36.9 C), temperature source Oral, resp. rate 20, height 5\' 4"  (1.626 m), weight 114.3 kg, SpO2 100 %.Body mass index is 43.26 kg/m.  General Appearance: Casual  Eye Contact:  Good  Speech:  Clear and Coherent  Volume:  Normal  Mood:  Dysphoric  Affect:  Blunt and Congruent  Thought Process:  Linear and Descriptions of Associations: Circumstantial  Orientation:  Full (Time, Place, and Person)  Thought Content:  Delusions  Suicidal Thoughts:  No  Homicidal Thoughts:  No  Memory:  Immediate;   Fair Recent;   Fair Remote;   Fair  Judgement:  Fair  Insight:  Fair  Psychomotor Activity:  Normal  Concentration:  Concentration: Fair and Attention Span: Fair  Recall:  [employer] of Knowledge:  Fair  Language: General paucity compared to baseline and yesterday  Akathisia:  Negative  Handed:  Right  AIMS (if indicated):     Assets:  Resilience Social Support  ADL's:  Intact  Cognition:  WNL  Sleep:  Number of Hours: 7.75      Treatment Plan Summary: Daily contact with patient to assess and evaluate symptoms and progress in treatment and Medication management  Escalate Tegretol check level  by Saturday continue Haldol therapy in anticipation of long-acting injectable no change in precautions today continue to investigate whereabouts of son as best we can  [name], MD 02/08/2019, 8:23 AM

## 2019-02-08 NOTE — Progress Notes (Signed)
D: Pt denies SI/HI/AVH. Pt is pleasant and cooperative. Pt continues to focus on her 49 yr old son. Pt was allowed to get her brothers number from her phone so she could contact the brother to find the son. Pt visible on the unit and appropriate . Pt refused her sleep medication, but stated she would come to staff if she had a hard time sleeping.   A: Pt was offered support and encouragement. Pt was given scheduled medications. Pt was encourage to attend groups. Q 15 minute checks were done for safety.   R:Pt attends groups and interacts well with peers and staff. Pt is taking medication..Pt receptive to treatment and safety maintained on unit.

## 2019-02-08 NOTE — BHH Counselor (Signed)
CSW attempted to meet with patient to complete PSA. Patient initially agreed to speak with CSW but declined to answer any questions and appeared to fall asleep.  Future attempts will be made to complete assessment.  Stephanie Acre, LCSW-A Clinical Social Worker

## 2019-02-09 DIAGNOSIS — F250 Schizoaffective disorder, bipolar type: Secondary | ICD-10-CM | POA: Diagnosis not present

## 2019-02-09 LAB — RAPID URINE DRUG SCREEN, HOSP PERFORMED
Amphetamines: NOT DETECTED
Barbiturates: NOT DETECTED
Benzodiazepines: NOT DETECTED
Cocaine: NOT DETECTED
Opiates: NOT DETECTED
Tetrahydrocannabinol: NOT DETECTED

## 2019-02-09 MED ORDER — IBUPROFEN 600 MG PO TABS
600.0000 mg | ORAL_TABLET | Freq: Once | ORAL | Status: AC
  Administered 2019-02-09: 600 mg via ORAL
  Filled 2019-02-09 (×2): qty 1

## 2019-02-09 MED ORDER — GABAPENTIN 300 MG PO CAPS
300.0000 mg | ORAL_CAPSULE | Freq: Three times a day (TID) | ORAL | Status: DC
  Administered 2019-02-09 – 2019-02-15 (×16): 300 mg via ORAL
  Filled 2019-02-09 (×26): qty 1

## 2019-02-09 MED ORDER — HALOPERIDOL 5 MG PO TABS
5.0000 mg | ORAL_TABLET | Freq: Every day | ORAL | Status: DC
  Administered 2019-02-10 – 2019-02-11 (×2): 5 mg via ORAL
  Filled 2019-02-09 (×3): qty 1

## 2019-02-09 MED ORDER — HALOPERIDOL 5 MG PO TABS
20.0000 mg | ORAL_TABLET | Freq: Every day | ORAL | Status: DC
  Administered 2019-02-09 – 2019-02-14 (×5): 20 mg via ORAL
  Filled 2019-02-09 (×8): qty 4

## 2019-02-09 NOTE — Progress Notes (Signed)
Pt allowed her son to remove her house keys from her locker and take them home. Writer documented the exchange on the secure and belongings sheets and had all parts(pt and son) signed the sheet.

## 2019-02-09 NOTE — Progress Notes (Signed)
Recreation Therapy Notes  Date: 10.23.20 Time: 1000 Location: 500 Hall  Group Topic: Team Building  Goal Area(s) Addresses:  Patient will effectively work with group towards shared goal. Patient will identify skills needed to complete activity. Patient will verbalize how skills can be used post d/c.  Behavioral Response:  Engaged  Intervention: Group Activity  Activity:  [employer] in [employer].  Each patient was given are rubber disc and as a whole the group was given a disc.  Patients were to work together using the disc to maneuver the group from one end of the hall to the other and back.  If anyone stepped off their disc the group would start over.  Education: Team Building, Discharge Planning  Education Outcome: Acknowledges understanding/In group clarification offered/Needs additional education.   Clinical Observations/Feedback: Pt was social with peers, listened and gave ideas for completing the activity.  Pt was bright and engaged throughout.   [name], LRT/CTRS         Ria Comment, Chalmer Zheng A 02/09/2019 11:05 AM

## 2019-02-09 NOTE — Progress Notes (Signed)
[facility] MD Progress Note  02/09/2019 10:46 AM [name]  MRN:  [id number] Subjective:    This is the latest of multiple admissions for Ms. Fellinger, a 48 year old patient with a known, and somewhat treatment resistant schizoaffective condition-complicated by intermittent substance abuse, and chronic partial and poor compliance. She had not functioning well since her last discharge 7/31- She had been very focused on dampness/water damage/mold in her apartment which has been investigated by the complex managers.  She is also been focused on the safety of her son who has possible gang involvement and goes missing for periods of time.  When she presented her main complaint was auditory hallucinations, although she would not elaborate content, and past medication trials have included numerous antipsychotics.  It is always difficult to discern if they are simply not effective or if she is partially/noncompliant  Her night was uneventful she reported no auditory hallucinations on morning rounds yesterday but stated that as the day went on they have recurred and she reports them already this morning but again will not elaborate content describes them as "outside" of her head, and "people talking" (not inside her head) she denies visual hallucinations-  However she does report, for the first time, tactile hallucinations she believes she is having a stinging feeling in her leg "like someone is shooting her" with a gun (which may be related to the concerns about her son and his gang involvement) and she reports "like someone is having sex with me" and her genitalia, another tactile hallucination that she blames on "Obama"  However, she denies thoughts of harming self or others.  No involuntary movements.  No EPS or TD.  Still has not given a urine drug screen stating she cannot urinate which of course is something she is avoiding.  We have known that she is abused cannabis, possibly cocaine in the past and  this may contribute to her exacerbations  Principal Problem: Acute psychosis in the context of noncompliance/possible drug use/underlying schizoaffective type condition Diagnosis: Active Problems:   Schizophrenia ([place])  Total Time spent with patient: 30 minutes  Past Psychiatric History: see eval-numerous past trials  Past Medical History:  Past Medical History:  Diagnosis Date  . Arthritis   . Bipolar 1 disorder ([place])   . Bronchitis   . Depression   . Hemorrhoid   . Schizoaffective disorder ([place])   . Schizophrenia ([place])    Pt denies and reports it is schizoaffective disorder.   . Transfusion history     Past Surgical History:  Procedure Laterality Date  . CESAREAN SECTION     Family History:  Family History  Problem Relation Age of Onset  . Diabetes type II Other    Family Psychiatric  History: no new data Social History:  Social History   Substance and Sexual Activity  Alcohol Use No     Social History   Substance and Sexual Activity  Drug Use No  . Types: Marijuana   Comment: Former user    Social History   Socioeconomic History  . Marital status: Legally Separated    Spouse name: Not on file  . Number of children: Not on file  . Years of education: Not on file  . Highest education level: Not on file  Occupational History  . Not on file  Social Needs  . Financial resource strain: Not on file  . Food insecurity    Worry: Not on file    Inability: Not on file  . Transportation  needs    Medical: Not on file    Non-medical: Not on file  Tobacco Use  . Smoking status: Current Every Day Smoker    Packs/day: 0.50    Years: 17.00    Pack years: 8.50    Types: Cigarettes  . Smokeless tobacco: Never Used  Substance and Sexual Activity  . Alcohol use: No  . Drug use: No    Types: Marijuana    Comment: Former user  . Sexual activity: Never  Lifestyle  . Physical activity    Days per week: Not on file    Minutes per session: Not on file  .  Stress: Not on file  Relationships  . Social Herbalist on phone: Not on file    Gets together: Not on file    Attends religious service: Not on file    Active member of club or organization: Not on file    Attends meetings of clubs or organizations: Not on file    Relationship status: Not on file  Other Topics Concern  . Not on file  Social History Narrative   ** Merged History Encounter **      Sleep: Good  Appetite:  Good  Current Medications: Current Facility-Administered Medications  Medication Dose Route Frequency Provider Last Rate Last Dose  . alum & mag hydroxide-simeth (MAALOX/MYLANTA) 200-200-20 MG/5ML suspension 30 mL  30 mL Oral Q6H PRN Rankin, Shuvon B, NP      . benztropine (COGENTIN) tablet 1 mg  1 mg Oral BID Rankin, Shuvon B, NP   1 mg at 02/09/19 1006  . carbamazepine (TEGRETOL XR) 12 hr tablet 200 mg  200 mg Oral Daily [name], MD   200 mg at 02/09/19 1007  . carbamazepine (TEGRETOL XR) 12 hr tablet 400 mg  400 mg Oral QHS Rankin, Shuvon B, NP   400 mg at 02/08/19 2114  . haloperidol (HALDOL) tablet 20 mg  20 mg Oral QHS [name], MD      . Derrill Memo ON 02/10/2019] haloperidol (HALDOL) tablet 5 mg  5 mg Oral Q1200 [name], MD      . traZODone (DESYREL) tablet 150 mg  150 mg Oral QHS Rankin, Shuvon B, NP      . zolpidem (AMBIEN) tablet 10 mg  10 mg Oral QHS PRN [name], MD        Lab Results: No results found for this or any previous visit (from the past 48 hour(s)).  Blood Alcohol level:  Lab Results  Component Value Date   ETH <10 02/05/2019   ETH <10 [id number]    Metabolic Disorder Labs: Lab Results  Component Value Date   HGBA1C 6.3 (H) 01/30/2017   MPG 134.11 01/30/2017   MPG 128 05/11/2015   No results found for: PROLACTIN Lab Results  Component Value Date   CHOL 157 01/29/2017   TRIG 119 01/29/2017   HDL 40 (L) 01/29/2017   CHOLHDL 3.9 01/29/2017   VLDL 24 01/29/2017   LDLCALC 93 01/29/2017   LDLCALC 97  05/11/2015    Physical Findings: AIMS:  , ,  ,  ,    CIWA:    COWS:     Musculoskeletal: Strength & Muscle Tone: within normal limits Gait & Station: normal Patient leans: n/a  Psychiatric Specialty Exam: Physical Exam  ROS  Blood pressure 123/74, pulse 79, temperature 98.2 F (36.8 C), temperature source Oral, resp. rate 20, height 5\' 4"  (1.626 m), weight 114.3 kg, SpO2 100 %.  Body mass index is 43.26 kg/m.  General Appearance: Casual and Disheveled  Eye Contact:  Fair  Speech:  Slow  Volume:  Decreased  Mood:  Dysphoric  Affect:  Congruent and Constricted  Thought Process:  Linear and Descriptions of Associations: Circumstantial  Orientation:  Full (Time, Place, and Person)  Thought Content:  Hallucinations: Auditory Tactile/newly elaborated delusions  Suicidal Thoughts:  No  Homicidal Thoughts:  No  Memory:  Immediate;   Fair Recent;   Fair Remote;   Fair  Judgement:  Fair  Insight:  Fair  Psychomotor Activity:  Normal  Concentration:  Concentration: Fair and Attention Span: Fair  Recall:  [employer] of Knowledge:  Fair  Language:  Fair  Akathisia:  Negative  Handed:  Right  AIMS (if indicated):     Assets:  Desire for Improvement Housing Physical Health Resilience Social Support  ADL's:  Intact  Cognition:  WNL  Sleep:  Number of Hours: 10.25     Treatment Plan Summary: Daily contact with patient to assess and evaluate symptoms and progress in treatment and Medication management  We will continue Tegretol as mood stabilization therapy, we will escalate Haldol to a total of 25 mg a day, she is having no EPS or TD and is tolerating it well and reports reduction in auditory hallucinations but elaborates delusional material and tactile hallucinations.  We will add gabapentin in case there may be some neuropathic issue with the stinging in her leg, we will monitor for safety on every 15 min precautions  Continue to keep in touch with the act team with her  permission and finally since she has not given a drug screen we will see if they can use the previously collected sample of blood for a blood based drug screen  [name], MD 02/09/2019, 10:46 AM

## 2019-02-09 NOTE — Progress Notes (Signed)
   02/09/19 2300  Psych Admission Type (Psych Patients Only)  Admission Status Involuntary  Psychosocial Assessment  Patient Complaints Anxiety  Eye Contact Fair  Facial Expression Anxious;Animated  Affect Appropriate to circumstance  Speech Soft;Slow  Interaction Attention-seeking;Assertive  Motor Activity Slow  Appearance/Hygiene Disheveled;In scrubs  Behavior Characteristics Cooperative  Mood Preoccupied;Suspicious  Thought Process  Coherency Disorganized  Content Paranoia  Delusions None reported or observed  Perception Hallucinations  Hallucination Auditory  Judgment Impaired  Confusion Mild   D: Pt visible on the unit, pt stated she has Arthritis and pt wa given Ibuprofen per MAR. Pt stated she takes Aleve at home.  A: Pt was offered support and encouragement. Pt was given scheduled medications. Pt was encourage to attend groups. Q 15 minute checks were done for safety.  R:Pt attends groups and interacts well with peers and staff. Pt is taking medication. Pt has no complaints.Pt receptive to treatment and safety maintained on unit.

## 2019-02-09 NOTE — Progress Notes (Signed)
Patient ID: [name], female   DOB: 11/07/1970, 48 y.o.   MRN: [id number]  MSW intern attempted to complete assessment. Pt was in bed and stated she did not want to do it.    Ovidio Kin, MSW intern

## 2019-02-10 DIAGNOSIS — F250 Schizoaffective disorder, bipolar type: Secondary | ICD-10-CM | POA: Diagnosis not present

## 2019-02-10 MED ORDER — CLINDAMYCIN HCL 300 MG PO CAPS
300.0000 mg | ORAL_CAPSULE | Freq: Three times a day (TID) | ORAL | Status: DC
  Administered 2019-02-10 – 2019-02-15 (×13): 300 mg via ORAL
  Filled 2019-02-10 (×10): qty 1
  Filled 2019-02-10: qty 2
  Filled 2019-02-10 (×3): qty 1
  Filled 2019-02-10: qty 2
  Filled 2019-02-10: qty 1
  Filled 2019-02-10: qty 2
  Filled 2019-02-10 (×7): qty 1

## 2019-02-10 MED ORDER — BENZTROPINE MESYLATE 2 MG PO TABS
2.0000 mg | ORAL_TABLET | Freq: Two times a day (BID) | ORAL | Status: DC
  Administered 2019-02-10 – 2019-02-15 (×9): 2 mg via ORAL
  Filled 2019-02-10 (×14): qty 1

## 2019-02-10 MED ORDER — HYDROXYZINE HCL 25 MG PO TABS
25.0000 mg | ORAL_TABLET | Freq: Four times a day (QID) | ORAL | Status: DC | PRN
  Administered 2019-02-12: 25 mg via ORAL
  Filled 2019-02-10: qty 1

## 2019-02-10 NOTE — Progress Notes (Signed)
Adult Psychoeducational Group Note  Date:  02/10/2019 Time:  1:42 AM  Group Topic/Focus:  Wrap-Up Group:   The focus of this group is to help patients review their daily goal of treatment and discuss progress on daily workbooks.  Participation Level:  Active  Participation Quality:  Appropriate  Affect:  Appropriate  Cognitive:  Appropriate  Insight: Appropriate  Engagement in Group:  Developing/Improving  Modes of Intervention:  Discussion  Additional Comments:  Pt stated her goal for today was to talk with doctor about her medication issue and to focus on her treatment plan. Pt stated she felt she accomplished both goals today.  Pt stated her relationship with her family has improved since she was admitted here. Pt stated her son coming tonight for visitation improved her day.  Pt stated she felt better about herself today. Pt rated her overall day a 10. Pt stated her appetite was pretty good today. Pt stated her goal for tonight is to get some sleep. Pt did complain of pain in her back and arms tonight. Pt's nurse was made aware of the situation. Pt stated she was not hearing voices or seeing anything that was not there.  Pt stated she had no thoughts of harming herself or others. Pt stated she would alert staff if anything changes  Candy Sledge 02/10/2019, 1:42 AM

## 2019-02-10 NOTE — BHH Group Notes (Signed)
[place] Group Notes: (Clinical Social Work)   02/10/2019      Type of Therapy:  Group Therapy   Participation Level:  Did Not Attend - was invited both individually by MHT and by overhead announcement, chose not to attend.   Selmer Dominion, LCSW 02/10/2019, 1:10 PM

## 2019-02-10 NOTE — BHH Counselor (Signed)
Clinical Social Work Note  CSW went to patient's room to attempt to do Psychosocial Assessment, as it is due today.  Patient was laying in bed and stated she did not want to do the assessment.  When asked if she could agree to do it tomorrow, she declined again and said she does not want to do any social work assessment at all.  Selmer Dominion, LCSW 02/10/2019, 5:04 PM

## 2019-02-10 NOTE — Progress Notes (Signed)
[place] Note: Patient is calm and appropriate on the unit.  Denies suicidal thoughts, auditory and visual hallucinations.  Medications given as prescribed. Reports vaginal itching and discomfort.  Asked  this nurse to assess for rash and to see a gynecologist.   No rash, cut or abscess noted.  Antibiotic therapy initiated.  Routine safety checks maintained every 15 minutes.  Patient is visible in milieu with minimal interaction.  Patient is safe on and off the unit.

## 2019-02-10 NOTE — Progress Notes (Addendum)
[facility] MD Progress Note  02/10/2019 11:55 AM [name]  MRN:  [id number] Subjective: Patient is a 48 year old female with a past psychiatric history significant for schizoaffective disorder; bipolar type who was admitted on 02/07/2019 secondary to paranoia and psychotic symptoms including auditory hallucinations.  Objective: Patient is seen and examined.  Patient is a 48 year old female with the above-stated past psychiatric history who is seen in follow-up.  Review of the electronic medical record from 10/23 revealed no auditory hallucinations on morning rounds, but as the day went on they had recurred.  Her dosage of Haldol was increased, and Tegretol was continued.  Gabapentin was added for neuropathic issues with her leg.  Her vital signs are stable, she is afebrile.  She slept 8.25 hours last night.  Admission laboratories showed essentially normal electrolytes, a CBC which revealed a mild anemia.  Blood alcohol and drug screen were negative.  Her current medications include Cogentin, Tegretol, gabapentin, Haldol, trazodone and Ambien.  Patient stated that she was feeling a bit better today.  She stated the voices were still present, but that they had gotten better.  She stated that the voices did say bad things to her.  She denied any suicidal ideation.  She did admit to some itching down in her genital area.  Nursing reviewed this physically, and did not see any rashes.  The patient stated that she had been with a strange female intimately recently.  It is unclear whether or not this is part of delusional thinking or true.  Her admission notes reflected excessive worry about the possibility of her son safety, and she did admit to having fears and concerns about that as well.  She does have a tremor today, and review of the electronic medical records revealed a history of significant EPS when she had previously been treated with Prolixin.  Principal Problem: <principal problem not  specified> Diagnosis: Active Problems:   Schizophrenia ([place])  Total Time spent with patient: 20 minutes  Past Psychiatric History: See admission H&P  Past Medical History:  Past Medical History:  Diagnosis Date  . Arthritis   . Bipolar 1 disorder ([place])   . Bronchitis   . Depression   . Hemorrhoid   . Schizoaffective disorder ([place])   . Schizophrenia ([place])    Pt denies and reports it is schizoaffective disorder.   . Transfusion history     Past Surgical History:  Procedure Laterality Date  . CESAREAN SECTION     Family History:  Family History  Problem Relation Age of Onset  . Diabetes type II Other    Family Psychiatric  History: See admission H&P Social History:  Social History   Substance and Sexual Activity  Alcohol Use No     Social History   Substance and Sexual Activity  Drug Use No  . Types: Marijuana   Comment: Former user    Social History   Socioeconomic History  . Marital status: Legally Separated    Spouse name: Not on file  . Number of children: Not on file  . Years of education: Not on file  . Highest education level: Not on file  Occupational History  . Not on file  Social Needs  . Financial resource strain: Not on file  . Food insecurity    Worry: Not on file    Inability: Not on file  . Transportation needs    Medical: Not on file    Non-medical: Not on file  Tobacco Use  . Smoking  status: Current Every Day Smoker    Packs/day: 0.50    Years: 17.00    Pack years: 8.50    Types: Cigarettes  . Smokeless tobacco: Never Used  Substance and Sexual Activity  . Alcohol use: No  . Drug use: No    Types: Marijuana    Comment: Former user  . Sexual activity: Never  Lifestyle  . Physical activity    Days per week: Not on file    Minutes per session: Not on file  . Stress: Not on file  Relationships  . Social Herbalist on phone: Not on file    Gets together: Not on file    Attends religious service: Not on file     Active member of club or organization: Not on file    Attends meetings of clubs or organizations: Not on file    Relationship status: Not on file  Other Topics Concern  . Not on file  Social History Narrative   ** Merged History Encounter **       Additional Social History:                         Sleep: Good  Appetite:  Fair  Current Medications: Current Facility-Administered Medications  Medication Dose Route Frequency Provider Last Rate Last Dose  . alum & mag hydroxide-simeth (MAALOX/MYLANTA) 200-200-20 MG/5ML suspension 30 mL  30 mL Oral Q6H PRN Rankin, Shuvon B, NP      . benztropine (COGENTIN) tablet 1 mg  1 mg Oral BID Rankin, Shuvon B, NP   1 mg at 02/10/19 1150  . carbamazepine (TEGRETOL XR) 12 hr tablet 200 mg  200 mg Oral Daily [name], MD   200 mg at 02/10/19 1150  . carbamazepine (TEGRETOL XR) 12 hr tablet 400 mg  400 mg Oral QHS Rankin, Shuvon B, NP   400 mg at 02/09/19 2111  . gabapentin (NEURONTIN) capsule 300 mg  300 mg Oral TID [name], MD   300 mg at 02/10/19 1150  . haloperidol (HALDOL) tablet 20 mg  20 mg Oral QHS [name], MD   20 mg at 02/09/19 2111  . haloperidol (HALDOL) tablet 5 mg  5 mg Oral Q1200 [name], MD   5 mg at 02/10/19 1151  . traZODone (DESYREL) tablet 150 mg  150 mg Oral QHS Rankin, Shuvon B, NP      . zolpidem (AMBIEN) tablet 10 mg  10 mg Oral QHS PRN [name], MD        Lab Results:  Results for orders placed or performed during the hospital encounter of 02/06/19 (from the past 48 hour(s))  Rapid urine drug screen (hospital performed)     Status: None   Collection Time: 02/09/19  6:22 PM  Result Value Ref Range   Opiates NONE DETECTED NONE DETECTED   Cocaine NONE DETECTED NONE DETECTED   Benzodiazepines NONE DETECTED NONE DETECTED   Amphetamines NONE DETECTED NONE DETECTED   Tetrahydrocannabinol NONE DETECTED NONE DETECTED   Barbiturates NONE DETECTED NONE DETECTED    Comment: (NOTE) DRUG SCREEN FOR  MEDICAL PURPOSES ONLY.  IF CONFIRMATION IS NEEDED FOR ANY PURPOSE, NOTIFY LAB WITHIN 5 DAYS. LOWEST DETECTABLE LIMITS FOR URINE DRUG SCREEN Drug Class                     Cutoff (ng/mL) Amphetamine and metabolites    1000 Barbiturate and metabolites    200  Benzodiazepine                 [id number] Tricyclics and metabolites     300 Opiates and metabolites        300 Cocaine and metabolites        300 THC                            50 Performed at [facility], [place] [address], [place], [place] [address]     Blood Alcohol level:  Lab Results  Component Value Date   [facility] <10 02/05/2019   ETH <10 [id number]    Metabolic Disorder Labs: Lab Results  Component Value Date   HGBA1C 6.3 (H) 01/30/2017   MPG 134.11 01/30/2017   MPG 128 05/11/2015   No results found for: PROLACTIN Lab Results  Component Value Date   CHOL 157 01/29/2017   TRIG 119 01/29/2017   HDL 40 (L) 01/29/2017   CHOLHDL 3.9 01/29/2017   VLDL 24 01/29/2017   LDLCALC 93 01/29/2017   LDLCALC 97 05/11/2015    Physical Findings: AIMS:  , ,  ,  ,    CIWA:    COWS:     Musculoskeletal: Strength & Muscle Tone: within normal limits Gait & Station: normal Patient leans: N/A  Psychiatric Specialty Exam: Physical Exam  Nursing note and vitals reviewed. Constitutional: She is oriented to person, place, and time. She appears well-developed and well-nourished.  HENT:  Head: Normocephalic and atraumatic.  Respiratory: Effort normal.  Neurological: She is alert and oriented to person, place, and time.    ROS  Blood pressure 123/74, pulse 79, temperature 98.2 F (36.8 C), temperature source Oral, resp. rate 20, height 5\' 4"  (1.626 m), weight 114.3 kg, SpO2 100 %.Body mass index is 43.26 kg/m.  General Appearance: Casual  Eye Contact:  Fair  Speech:  Normal Rate  Volume:  Decreased  Mood:  Anxious and Dysphoric  Affect:  Congruent  Thought Process:  Coherent and Descriptions of Associations:  Loose  Orientation:  Full (Time, Place, and Person)  Thought Content:  Delusions and Hallucinations: Auditory  Suicidal Thoughts:  No  Homicidal Thoughts:  No  Memory:  Immediate;   Fair Recent;   Fair Remote;   Fair  Judgement:  Intact  Insight:  Fair  Psychomotor Activity:  Normal  Concentration:  Concentration: Fair and Attention Span: Fair  Recall:  [employer] of Knowledge:  Fair  Language:  Good  Akathisia:  Yes  Handed:  Right  AIMS (if indicated):     Assets:  Desire for Improvement Resilience  ADL's:  Intact  Cognition:  WNL  Sleep:  Number of Hours: 8.25     Treatment Plan Summary: Daily contact with patient to assess and evaluate symptoms and progress in treatment, Medication management and Plan : Patient is seen and examined.  Patient is a 48 year old female with the above-stated past psychiatric history who is seen in follow-up.   Diagnosis: #1 schizoaffective disorder; bipolar type, #2 EPS/akathisia, #3 pruritus  Patient is seen in follow-up.  At least according to the notes she looks like she is doing a bit better.  Her sleep is good and the auditory hallucinations are decreasing.  She is having some problems with akathisia and EPS.  I will increase her Cogentin to 2 mg p.o. twice daily.  She is having some itching down in her private area, and nursing reviewed this.  No evidence of any rash at this point.  I have written for hydroxyzine for that, and as well review of the electronic medical records revealed a previous history of bacterial vaginosis.  I will go on and write for Flagyl just in case.  Otherwise no change in her Tegretol, gabapentin, Haldol at this point. 1.  Increase Cogentin to 2 mg p.o. twice daily for EPS and akathisia. 2.  Continue Tegretol extended release 200 mg p.o. daily and 400 mg p.o. nightly for mood stability. 3.  Continue gabapentin 300 mg p.o. 3 times daily for mood stability. 4.  Continue haloperidol 5 mg p.o. daily and 20 mg p.o. nightly  for psychosis. 5.  Add hydroxyzine 25 mg p.o. every 6 hours as needed itching. 6.  Continue trazodone 150 mg p.o. nightly for insomnia. 7.  Continue Ambien 10 mg p.o. nightly as needed insomnia. 8.  Patient reportedly allergic to Flagyl, so we will have to give clindamycin 300 mg p.o. 3 times daily for 7 days for bacterial vaginosis. 9.  Order HIV, hepatitis and RPR. 10.  Disposition planning-in progress.  Sharma Covert, MD 02/10/2019, 11:55 AM

## 2019-02-11 DIAGNOSIS — F250 Schizoaffective disorder, bipolar type: Secondary | ICD-10-CM | POA: Diagnosis not present

## 2019-02-11 LAB — HEPATITIS PANEL, ACUTE
HCV Ab: NONREACTIVE
Hep A IgM: NONREACTIVE
Hep B C IgM: NONREACTIVE
Hepatitis B Surface Ag: NONREACTIVE

## 2019-02-11 LAB — RPR: RPR Ser Ql: NONREACTIVE

## 2019-02-11 LAB — HIV ANTIBODY (ROUTINE TESTING W REFLEX): HIV Screen 4th Generation wRfx: NONREACTIVE

## 2019-02-11 MED ORDER — NICOTINE 21 MG/24HR TD PT24
21.0000 mg | MEDICATED_PATCH | Freq: Every day | TRANSDERMAL | Status: DC
  Filled 2019-02-11 (×3): qty 1

## 2019-02-11 MED ORDER — NICOTINE POLACRILEX 2 MG MT GUM
2.0000 mg | CHEWING_GUM | OROMUCOSAL | Status: DC | PRN
  Administered 2019-02-11 – 2019-02-12 (×2): 2 mg via ORAL
  Filled 2019-02-11: qty 1
  Filled 2019-02-11: qty 8

## 2019-02-11 MED ORDER — NICOTINE POLACRILEX 2 MG MT GUM: 2.0000 mg | CHEWING_GUM | OROMUCOSAL | Status: DC | PRN

## 2019-02-11 MED ORDER — AVEENO SOOTHING BATH TREATMENT EX PACK
1.0000 | PACK | Freq: Every day | CUTANEOUS | Status: DC
  Administered 2019-02-11 – 2019-02-15 (×2): 1 via TOPICAL
  Filled 2019-02-11 (×8): qty 1

## 2019-02-11 MED ORDER — AVEENO MOISTURIZING EX BAR
CHEWABLE_BAR | Freq: Every day | CUTANEOUS | Status: DC
  Filled 2019-02-11 (×2): qty 1

## 2019-02-11 NOTE — Progress Notes (Signed)
Patient has been lying in her bed asleep since shift change. Patient is safe and no distress noted. Safety maintained with 15 min checks.
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
## 2019-02-11 NOTE — BHH Suicide Risk Assessment (Signed)
[facility] INPATIENT:  Family/Significant Other Suicide Prevention Education  Suicide Prevention Education:  Patient Refusal for Family/Significant Other Suicide Prevention Education: The patient [name] has refused to provide written consent for family/significant other to be provided Family/Significant Other Suicide Prevention Education during admission and/or prior to discharge.  Physician notified.  Yeiri Strosnider Grossman-Orr 02/11/2019, 1:15 PM

## 2019-02-11 NOTE — Progress Notes (Signed)
D:  Patient's self inventory sheet, patient sleeps good, no sleep medication.  Good appetite, normal energy level, good concentration.  Denied depression, hopeless and anxiety.  Denied withdrawals.  Denied SI.  Physical problems, arthritis.  Worst pain #7 in past 24 hours.  Goal is stay positive.  Try not to think about things.  Suggestion is having a person to walk around in calm patience just to talk to.  Does have discharge plans. A:  Medications administered per MD order.  Emotional support and encouragement given patient. R:  Denied SI and HI, contracts for safety.  Denied A/V hallucinations.  Safety maintained with 15 minute checks.

## 2019-02-11 NOTE — Progress Notes (Signed)
Patient woke up and came to nursing station inquiring about her son and the time. She wanted to know if he came to visit while she had been asleep. She was informed of medications she needed to take and she reports that she takes only one pill and she is taking too much medicine now. Writer encouraged her to talk with her doctor about it int he morning. Snacks received and antibiotic. Safety maintained on 15 min checks.

## 2019-02-11 NOTE — BHH Group Notes (Signed)
[facility] LCSW Group Therapy Note  Date/Time:  02/11/2019  11:00AM-12:00PM  Type of Therapy and Topic:  Group Therapy:  Music and Mood  Participation Level:  Active   Description of Group: In this process group, members listened to a variety of genres of music and identified that different types of music evoke different responses.  Patients were encouraged to identify music that was soothing for them and music that was energizing for them.  Patients discussed how this knowledge can help with wellness and recovery in various ways including managing depression and anxiety as well as encouraging healthy sleep habits.    Therapeutic Goals: 1. Patients will explore the impact of different varieties of music on mood 2. Patients will verbalize the thoughts they have when listening to different types of music 3. Patients will identify music that is soothing to them as well as music that is energizing to them 4. Patients will discuss how to use this knowledge to assist in maintaining wellness and recovery 5. Patients will explore the use of music as a coping skill  Summary of Patient Progress:  At the beginning of group, patient expressed that she felt alright.  She sang and moved to the music and smiled some.  She was called out to see the doctor, then returned.  However, she did leave before the end of group, did not return.  Therapeutic Modalities: Solution Focused Brief Therapy Activity   Selmer Dominion, LCSW

## 2019-02-11 NOTE — Plan of Care (Signed)
Nurse discussed anxiety, depression and coping skills with patient.  

## 2019-02-12 DIAGNOSIS — F250 Schizoaffective disorder, bipolar type: Secondary | ICD-10-CM | POA: Diagnosis not present

## 2019-02-12 MED ORDER — PRIMIDONE 50 MG PO TABS
50.0000 mg | ORAL_TABLET | Freq: Three times a day (TID) | ORAL | Status: DC
  Administered 2019-02-12 – 2019-02-15 (×8): 50 mg via ORAL
  Filled 2019-02-12 (×15): qty 1

## 2019-02-12 MED ORDER — IBUPROFEN 600 MG PO TABS
600.0000 mg | ORAL_TABLET | Freq: Once | ORAL | Status: AC
  Administered 2019-02-13: 600 mg via ORAL
  Filled 2019-02-12 (×2): qty 1

## 2019-02-12 NOTE — BHH Group Notes (Signed)
  [facility] LCSW Group Therapy Note  Date/Time: 02/12/2019 @ 1:30pm  Type of Therapy/Topic:  Group Therapy:  Emotion Regulation  Participation Level:  Active   Mood: Plesant  Description of Group:    The purpose of this group is to assist patients in learning to regulate negative emotions and experience positive emotions. Patients will be guided to discuss ways in which they have been vulnerable to their negative emotions. These vulnerabilities will be juxtaposed with experiences of positive emotions or situations, and patients challenged to use positive emotions to combat negative ones. Special emphasis will be placed on coping with negative emotions in conflict situations, and patients will process healthy conflict resolution skills.  Therapeutic Goals: 1. Patient will identify two positive emotions or experiences to reflect on in order to balance out negative emotions:  2. Patient will label two or more emotions that they find the most difficult to experience:  3. Patient will be able to demonstrate positive conflict resolution skills through discussion or role plays:   Summary of Patient Progress:   Patient was engaged and active throughout group therapy today. Patient was able to label to the most difficult emotions that she experiences which is fear. Patient stated that it is more of the "unknowning". Patient stated that what helps her with her emotion regulation is breathing techniques. Patient stated that it helps her calm down and regulate herself.     Therapeutic Modalities:   Cognitive Behavioral Therapy Feelings Identification Dialectical Behavioral Therapy   Ardelle Anton, LCSW

## 2019-02-12 NOTE — Progress Notes (Signed)
[facility] MD Progress Note  02/12/2019 12:39 PM [name]  MRN:  [id number] Subjective:    Patient seen in her room she reports recent but no current auditory hallucinations, she denies thoughts of harming herself she has now a recurrence of EPS, since I escalated her Haldol to 25 mg she has a parkinsonian tremor of the right side and head tremor so we will reduce it back to 20 mg and add some Mysoline continue Cogentin.  Discussed various options with team to include more anticholinergic antipsychotics but we will try the strategy first Principal Problem: Exacerbation of psychotic disorder Diagnosis: Active Problems:   Schizophrenia ([place])  Total Time spent with patient: 20 minutes  Past Psychiatric History: See eval  Past Medical History:  Past Medical History:  Diagnosis Date  . Arthritis   . Bipolar 1 disorder ([employer])   . Bronchitis   . Depression   . Hemorrhoid   . Schizoaffective disorder ([place])   . Schizophrenia ([place])    Pt denies and reports it is schizoaffective disorder.   . Transfusion history     Past Surgical History:  Procedure Laterality Date  . CESAREAN SECTION     Family History:  Family History  Problem Relation Age of Onset  . Diabetes type II Other    Family Psychiatric  History: See eval Social History:  Social History   Substance and Sexual Activity  Alcohol Use No     Social History   Substance and Sexual Activity  Drug Use No  . Types: Marijuana   Comment: Former user    Social History   Socioeconomic History  . Marital status: Legally Separated    Spouse name: Not on file  . Number of children: Not on file  . Years of education: Not on file  . Highest education level: Not on file  Occupational History  . Not on file  Social Needs  . Financial resource strain: Not on file  . Food insecurity    Worry: Not on file    Inability: Not on file  . Transportation needs    Medical: Not on file    Non-medical: Not on file  Tobacco Use   . Smoking status: Current Every Day Smoker    Packs/day: 0.50    Years: 17.00    Pack years: 8.50    Types: Cigarettes  . Smokeless tobacco: Never Used  Substance and Sexual Activity  . Alcohol use: No  . Drug use: No    Types: Marijuana    Comment: Former user  . Sexual activity: Never  Lifestyle  . Physical activity    Days per week: Not on file    Minutes per session: Not on file  . Stress: Not on file  Relationships  . Social Herbalist on phone: Not on file    Gets together: Not on file    Attends religious service: Not on file    Active member of club or organization: Not on file    Attends meetings of clubs or organizations: Not on file    Relationship status: Not on file  Other Topics Concern  . Not on file  Social History Narrative   ** Merged History Encounter **       Additional Social History:                         Sleep: Good  Appetite:  Good  Current Medications: Current Facility-Administered  Medications  Medication Dose Route Frequency Provider Last Rate Last Dose  . alum & mag hydroxide-simeth (MAALOX/MYLANTA) 200-200-20 MG/5ML suspension 30 mL  30 mL Oral Q6H PRN Rankin, Shuvon B, NP      . [employer] Treatment 1 packet  1 packet Topical Daily [name], MD   1 packet at 02/11/19 2221  . benztropine (COGENTIN) tablet 2 mg  2 mg Oral BID Sharma Covert, MD   2 mg at 02/12/19 0740  . carbamazepine (TEGRETOL XR) 12 hr tablet 200 mg  200 mg Oral Daily [name], MD   200 mg at 02/12/19 0740  . carbamazepine (TEGRETOL XR) 12 hr tablet 400 mg  400 mg Oral QHS Rankin, Shuvon B, NP   400 mg at 02/11/19 2127  . clindamycin (CLEOCIN) capsule 300 mg  300 mg Oral Q8H Sharma Covert, MD   300 mg at 02/11/19 1900  . gabapentin (NEURONTIN) capsule 300 mg  300 mg Oral TID [name], MD   300 mg at 02/12/19 0740  . haloperidol (HALDOL) tablet 20 mg  20 mg Oral QHS [name], MD   20 mg at 02/11/19 2126  .  hydrOXYzine (ATARAX/VISTARIL) tablet 25 mg  25 mg Oral Q6H PRN Sharma Covert, MD      . nicotine polacrilex (NICORETTE) gum 2 mg  2 mg Oral PRN [name], MD   2 mg at 02/11/19 1246  . primidone (MYSOLINE) tablet 50 mg  50 mg Oral TID [name], MD      . traZODone (DESYREL) tablet 150 mg  150 mg Oral QHS Rankin, Shuvon B, NP      . zolpidem (AMBIEN) tablet 10 mg  10 mg Oral QHS PRN [name], MD        Lab Results:  Results for orders placed or performed during the hospital encounter of 02/06/19 (from the past 48 hour(s))  RPR     Status: None   Collection Time: 02/11/19  6:56 AM  Result Value Ref Range   RPR Ser Ql NON REACTIVE NON REACTIVE    Comment: Performed at [facility], [place] [address] [place], [place] [address]  Hepatitis panel, acute     Status: None   Collection Time: 02/11/19  6:56 AM  Result Value Ref Range   Hepatitis B Surface Ag NON REACTIVE NON REACTIVE   HCV Ab NON REACTIVE NON REACTIVE    Comment: (NOTE) Nonreactive HCV antibody screen is consistent with no HCV infections,  unless recent infection is suspected or other evidence exists to indicate HCV infection.    Hep A IgM NON REACTIVE NON REACTIVE   Hep B C IgM NON REACTIVE NON REACTIVE    Comment: Performed at [facility], [place] [address], [place], [place] [address]  HIV Antibody (routine testing w rflx)     Status: None   Collection Time: 02/11/19  6:56 AM  Result Value Ref Range   HIV Screen 4th Generation wRfx NON REACTIVE NON REACTIVE    Comment: Performed at [place] [address] [place],  [address]    Blood Alcohol level:  Lab Results  Component Value Date   [facility] <10 02/05/2019   ETH <10 [id number]    Metabolic Disorder Labs: Lab Results  Component Value Date   HGBA1C 6.3 (H) 01/30/2017   MPG 134.11 01/30/2017   MPG 128 05/11/2015   No results found for: PROLACTIN Lab Results  Component Value Date  CHOL 157 01/29/2017   TRIG 119  01/29/2017   HDL 40 (L) 01/29/2017   CHOLHDL 3.9 01/29/2017   VLDL 24 01/29/2017   LDLCALC 93 01/29/2017   LDLCALC 97 05/11/2015    Physical Findings: AIMS: Facial and Oral Movements Muscles of Facial Expression: None, normal Lips and Perioral Area: None, normal Jaw: None, normal Tongue: None, normal,Extremity Movements Upper (arms, wrists, hands, fingers): None, normal Lower (legs, knees, ankles, toes): None, normal, Trunk Movements Neck, shoulders, hips: None, normal, Overall Severity Severity of abnormal movements (highest score from questions above): None, normal Incapacitation due to abnormal movements: None, normal Patient's awareness of abnormal movements (rate only patient's report): No Awareness, Dental Status Current problems with teeth and/or dentures?: No Does patient usually wear dentures?: No  CIWA:  CIWA-Ar Total: 1 COWS:  COWS Total Score: 1  Musculoskeletal: Strength & Muscle Tone: within normal limits Gait & Station: normal Patient leans: N/A  Psychiatric Specialty Exam: Physical Exam  ROS  Blood pressure 124/79, pulse 77, temperature 98.2 F (36.8 C), temperature source Oral, resp. rate 18, height 5\' 4"  (1.626 m), weight 114.3 kg, SpO2 100 %.Body mass index is 43.26 kg/m.  General Appearance: Disheveled  Eye Contact:  Minimal  Speech:  Pressured  Volume:  Decreased  Mood:  Dysphoric  Affect:  Blunt  Thought Process:  Goal Directed  Orientation:  Full (Time, Place, and Person)  Thought Content:  Delusions and Hallucinations: Auditory  Suicidal Thoughts:  neg  Homicidal Thoughts:  No  Memory:  Immediate;   Fair Recent;   Fair Remote generally intact  Judgement:  Fair  Insight:  Fair  Psychomotor Activity:  Normal  Concentration:  Concentration: Fair and Attention Span: Fair  Recall:  [employer] of Knowledge:  Fair  Language:  Fair  Akathisia:  Negative  Handed:  Right  AIMS (if indicated):     Assets:  Physical Health Resilience  ADL's:   Intact  Cognition:  WNL  Sleep:  Number of Hours: 5.5     Treatment Plan Summary: Daily contact with patient to assess and evaluate symptoms and progress in treatment and Medication management continue current reality based in cognitive therapies continue to monitor for safety no change in precautions no change in meds today other than reduction in haloperidol and addition of Mysoline  Ridwan Bondy, MD 02/12/2019, 12:39 PM

## 2019-02-12 NOTE — Progress Notes (Signed)
Adult Psychoeducational Group Note  Date:  02/12/2019 Time:  2:51 AM  Group Topic/Focus:  Wrap-Up Group:   The focus of this group is to help patients review their daily goal of treatment and discuss progress on daily workbooks.  Participation Level:  Active  Participation Quality:  Appropriate  Affect:  Appropriate  Cognitive:  Appropriate  Insight: Appropriate  Engagement in Group:  Developing/Improving  Modes of Intervention:  Discussion  Additional Comments:   Pt stated her goal for today was to focus on her treatment plan. Pt stated she accomplished her goal today. Pt stated her relationship with her family and support system needs to be improved.  Pt stated she felt better about herself today. Pt rated her over all day a 8 out of 10. Pt stated her appetite was pretty good today. Pt stated her goal for tonight was to get some sleep. Pt stated she was in no pain this evening. Pt stated she was hearing and seeing things earlier today. Writer asked pt did she remember what time frame this last happen. Pt stated around 3:30pm. Writer made nurse aware of the situation. Pt stated she had no thoughts of harming herself or others. Pt stated if anything change she would alert staff. Candy Sledge 02/12/2019, 2:51 AM

## 2019-02-12 NOTE — Progress Notes (Signed)
D: Pt visible on the unit , pt complained of arthritis pain, pt was informed to tell the doctor 2 days ago, pt stated she forgot. NP Corene Cornea prescribed 1x 600 mg Ibuprofen.   A: Pt was offered support and encouragement.  Pt was encourage to attend groups. Q 15 minute checks were done for safety.   R:Pt attends groups and interacts well with peers and staff. Pt is taking medication. Pt has no complaints.Pt receptive to treatment and safety maintained on unit.

## 2019-02-12 NOTE — Progress Notes (Signed)
Patient has been up in the dayroom watching tv and interacting appropriately with peers and staff. She reported that she plans to take her medications tonight be/c the doctor reported to her that she had refused them. Writer explained to her that she was asleep the entire shift Saturday and was not awakened to take her meds by Probation officer. She has been pleasant and cooperative. Support given and safety maintained with 15 min checks.

## 2019-02-12 NOTE — Tx Team (Signed)
Interdisciplinary Treatment and Diagnostic Plan Update  02/12/2019 Time of Session: 11:00am [name] MRN: [id number]  Principal Diagnosis: <principal problem not specified>  Secondary Diagnoses: Active Problems:   Schizophrenia ([place])   Current Medications:  Current Facility-Administered Medications  Medication Dose Route Frequency Provider Last Rate Last Dose  . alum & mag hydroxide-simeth (MAALOX/MYLANTA) 200-200-20 MG/5ML suspension 30 mL  30 mL Oral Q6H PRN Rankin, Shuvon B, NP      . [employer] Treatment 1 packet  1 packet Topical Daily [name], MD   1 packet at 02/11/19 2221  . benztropine (COGENTIN) tablet 2 mg  2 mg Oral BID Sharma Covert, MD   2 mg at 02/12/19 0740  . carbamazepine (TEGRETOL XR) 12 hr tablet 200 mg  200 mg Oral Daily [name], MD   200 mg at 02/12/19 0740  . carbamazepine (TEGRETOL XR) 12 hr tablet 400 mg  400 mg Oral QHS Rankin, Shuvon B, NP   400 mg at 02/11/19 2127  . clindamycin (CLEOCIN) capsule 300 mg  300 mg Oral Q8H Sharma Covert, MD   300 mg at 02/12/19 1243  . gabapentin (NEURONTIN) capsule 300 mg  300 mg Oral TID [name], MD   300 mg at 02/12/19 1243  . haloperidol (HALDOL) tablet 20 mg  20 mg Oral QHS [name], MD   20 mg at 02/11/19 2126  . hydrOXYzine (ATARAX/VISTARIL) tablet 25 mg  25 mg Oral Q6H PRN Sharma Covert, MD      . nicotine polacrilex (NICORETTE) gum 2 mg  2 mg Oral PRN [name], MD   2 mg at 02/11/19 1246  . primidone (MYSOLINE) tablet 50 mg  50 mg Oral TID [name], MD   50 mg at 02/12/19 1244  . traZODone (DESYREL) tablet 150 mg  150 mg Oral QHS Rankin, Shuvon B, NP      . zolpidem (AMBIEN) tablet 10 mg  10 mg Oral QHS PRN [name], MD       PTA Medications: Medications Prior to Admission  Medication Sig Dispense Refill Last Dose  . baclofen (LIORESAL) 10 MG tablet Take 10 mg by mouth 3 (three) times daily as needed for muscle spasms.     . benztropine (COGENTIN) 1 MG  tablet Take 1 tablet (1 mg total) by mouth 2 (two) times daily. 60 tablet 11   . carbamazepine (TEGRETOL-XR) 200 MG 12 hr tablet Take 2 tablets (400 mg total) by mouth at bedtime. 60 tablet 11   . Lumateperone Tosylate (CAPLYTA) 42 MG CAPS Take 42 mg by mouth daily.     . mirtazapine (REMERON) 15 MG tablet Take 15 mg by mouth at bedtime.     . Multiple Vitamin (MULTIVITAMIN WITH MINERALS) TABS tablet Take 1 tablet by mouth daily.     Marland Kitchen perphenazine (TRILAFON) 4 MG tablet Take 4 mg by mouth at bedtime.     . risperiDONE (RISPERDAL) 3 MG tablet Take 2 tablets (6 mg total) by mouth at bedtime. (Patient not taking: Reported on 02/06/2019) 60 tablet 11   . traZODone (DESYREL) 150 MG tablet Take 1 tablet (150 mg total) by mouth at bedtime. (Patient not taking: Reported on 02/06/2019) 30 tablet 11     Patient Stressors: Marital or family conflict Medication change or noncompliance  Patient Strengths: Technical sales engineer for treatment/growth  Treatment Modalities: Medication Management, Group therapy, Case management,  1 to 1 session with clinician, Psychoeducation, Recreational therapy.  Physician Treatment Plan for Primary Diagnosis: <principal problem not specified> Long Term Goal(s): Improvement in symptoms so as ready for discharge Improvement in symptoms so as ready for discharge   Short Term Goals: Ability to identify and develop effective coping behaviors will improve Ability to maintain clinical measurements within normal limits will improve Compliance with prescribed medications will improve Ability to verbalize feelings will improve Ability to disclose and discuss suicidal ideas Ability to demonstrate self-control will improve Ability to identify and develop effective coping behaviors will improve Ability to maintain clinical measurements within normal limits will improve  Medication Management: Evaluate patient's response, side effects, and tolerance of  medication regimen.  Therapeutic Interventions: 1 to 1 sessions, Unit Group sessions and Medication administration.  Evaluation of Outcomes: Progressing  Physician Treatment Plan for Secondary Diagnosis: Active Problems:   Schizophrenia ([place])  Long Term Goal(s): Improvement in symptoms so as ready for discharge Improvement in symptoms so as ready for discharge   Short Term Goals: Ability to identify and develop effective coping behaviors will improve Ability to maintain clinical measurements within normal limits will improve Compliance with prescribed medications will improve Ability to verbalize feelings will improve Ability to disclose and discuss suicidal ideas Ability to demonstrate self-control will improve Ability to identify and develop effective coping behaviors will improve Ability to maintain clinical measurements within normal limits will improve     Medication Management: Evaluate patient's response, side effects, and tolerance of medication regimen.  Therapeutic Interventions: 1 to 1 sessions, Unit Group sessions and Medication administration.  Evaluation of Outcomes: Progressing   RN Treatment Plan for Primary Diagnosis: <principal problem not specified> Long Term Goal(s): Knowledge of disease and therapeutic regimen to maintain health will improve  Short Term Goals: Ability to participate in decision making will improve, Ability to verbalize feelings will improve, Ability to disclose and discuss suicidal ideas, Ability to identify and develop effective coping behaviors will improve and Compliance with prescribed medications will improve  Medication Management: RN will administer medications as ordered by provider, will assess and evaluate patient's response and provide education to patient for prescribed medication. RN will report any adverse and/or side effects to prescribing provider.  Therapeutic Interventions: 1 on 1 counseling sessions, Psychoeducation,  Medication administration, Evaluate responses to treatment, Monitor vital signs and CBGs as ordered, Perform/monitor CIWA, COWS, AIMS and Fall Risk screenings as ordered, Perform wound care treatments as ordered.  Evaluation of Outcomes: Progressing   LCSW Treatment Plan for Primary Diagnosis: <principal problem not specified> Long Term Goal(s): Safe transition to appropriate next level of care at discharge, Engage patient in therapeutic group addressing interpersonal concerns.  Short Term Goals: Engage patient in aftercare planning with referrals and resources and Increase skills for wellness and recovery  Therapeutic Interventions: Assess for all discharge needs, 1 to 1 time with Social worker, Explore available resources and support systems, Assess for adequacy in community support network, Educate family and significant other(s) on suicide prevention, Complete Psychosocial Assessment, Interpersonal group therapy.  Evaluation of Outcomes: Progressing   Progress in Treatment: Attending groups: Yes. Participating in groups: Yes. Taking medication as prescribed: Yes. Toleration medication: Yes. Family/Significant other contact made: No, will contact:  pt declined; SPE with pt Patient understands diagnosis: Yes. Discussing patient identified problems/goals with staff: Yes. Medical problems stabilized or resolved: No.; Tremors Denies suicidal/homicidal ideation: Yes. Issues/concerns per patient self-inventory: No. Other:   New problem(s) identified: No, Describe:  None  New Short Term/Long Term Goal(s): Medication stabilization, elimination of SI thoughts, and development  of a comprehensive mental wellness plan.   Patient Goals:  "needing housing due to water and molding"   Discharge Plan or Barriers: Patient will be discharging home (CSW will give resources for housing repairs) and will be following up with ACTT team.   Reason for Continuation of Hospitalization:  Hallucinations Medical Issues Medication stabilization Other; describe Tremors (medical)  Estimated Length of Stay: 3-5 days   Attendees: Patient: 02/12/2019   Physician: Dr. [name], MD 02/12/2019  Nursing: Legrand Como, RN 02/12/2019   RN Care Manager: 02/12/2019   Social Worker: Ardelle Anton, LCSW  02/12/2019   Recreational Therapist:  02/12/2019   Other:  02/12/2019   Other:  02/12/2019   Other: 02/12/2019      Scribe for Treatment Team: Trecia Rogers, LCSW 02/12/2019 2:25 PM

## 2019-02-12 NOTE — Progress Notes (Signed)
Recreation Therapy Notes  Date: 10.26.20 Time: 1006 Location: [address] Dayroom  Group Topic: Wellness  Goal Area(s) Addresses:  Patient will define components of whole wellness. Patient will verbalize benefit of whole wellness.  Intervention: Music  Activity:  Exercise.  LRT led group in a series of stretches.  Each member the group got the chance to lead an exercise of their choice.  The group was to complete 3 rounds of exercise or 30 minutes of exercise.  Patients were instructed to take breaks and drink water as needed.  Education: Wellness, Dentist.   Education Outcome: Acknowledges education/In group clarification offered/Needs additional education.   Clinical Observations/Feedback:  Pt did not attend group session.     [name], LRT/CTRS          Ria Comment, Avianna Moynahan A 02/12/2019 11:08 AM

## 2019-02-13 DIAGNOSIS — F250 Schizoaffective disorder, bipolar type: Secondary | ICD-10-CM | POA: Diagnosis not present

## 2019-02-13 MED ORDER — VALBENAZINE TOSYLATE 40 MG PO CAPS
40.0000 mg | ORAL_CAPSULE | Freq: Every day | ORAL | Status: DC
  Administered 2019-02-13 – 2019-02-15 (×3): 40 mg via ORAL
  Filled 2019-02-13 (×2): qty 1

## 2019-02-13 MED ORDER — NON FORMULARY: 40.0000 mg | Freq: Every day | Status: DC

## 2019-02-13 NOTE — Progress Notes (Signed)
D: Pt had bag of marijuana in the pants pocket of clothes brought in by outside person. Pt was placed on visitor restrictions for 24 hours, no visitation for 10/28.    A: Pt was offered support and encouragement. Pt was given scheduled medications. Pt was encourage to attend groups. Q 15 minute checks were done for safety.  R:Pt attends groups and interacts well with peers and staff. Pt is taking medication. Pt has no complaints.Pt receptive to treatment and safety maintained on unit.

## 2019-02-13 NOTE — Progress Notes (Signed)
Adult Psychoeducational Group Note  Date:  02/13/2019 Time:  3:15 AM  Group Topic/Focus:  Wrap-Up Group:   The focus of this group is to help patients review their daily goal of treatment and discuss progress on daily workbooks.  Participation Level:  Active  Participation Quality:  Appropriate  Affect:  Appropriate  Cognitive:  Appropriate  Insight: Appropriate  Engagement in Group:  Developing/Improving  Modes of Intervention:  Discussion  Additional Comments:  Pt stated her goal for today was to focus on her treatment plan. Pt stated she accomplished her goal today. Pt stated her relationship with her family and support system has improved since she was admitted here. Pt stated her son coming for visitation today help her improve her day. Pt stated she felt better about herself today. Pt rated her over all day a 7 out of 10. Pt stated her appetite was improved today. Pt stated her goal for tonight was to get some sleep. Pt stated she had pain all over her body tonight.  Pt nurse was made aware of the situation.  Pt stated she was not hearing or seeing anything that was not there. Pt stated she had no thoughts of harming herself or others. Pt stated if anything change she would alert staff.  Candy Sledge 02/13/2019, 3:15 AM

## 2019-02-13 NOTE — Progress Notes (Signed)
[facility] MD Progress Note  02/13/2019 8:58 AM [name] [name]  MRN:  [id number] Subjective:    Patient reports that she is doing well today and describes her mood as "okay."  She says that whatever is going to happen will happen and that she is okay with that as long as she can love God and allow God to love her.  She has been sleeping well although she does say she awoke in the middle of the night last night and had trouble falling back asleep, though she was sleeping comfortably in bed this morning when this writer went to speak with her.  She denies AVH and reports that her tremor gone away, although she did have a few intermittent episodes of mild head bobbing during the interview this morning, though these were much improved from yesterday am.  She reports that she is ready to go home and that one of her sons will be able to come check on her after she leaves.   Principal Problem: Exacerbation of psychotic disorder  Diagnosis: Active Problems:   Schizophrenia ([place])  Total Time spent with patient: 20 minutes  Past Psychiatric History: See eval  Past Medical History:  Past Medical History:  Diagnosis Date  . Arthritis   . Bipolar 1 disorder ([place])   . Bronchitis   . Depression   . Hemorrhoid   . Schizoaffective disorder ([place])   . Schizophrenia ([place])    Pt denies and reports it is schizoaffective disorder.   . Transfusion history     Past Surgical History:  Procedure Laterality Date  . CESAREAN SECTION     Family History:  Family History  Problem Relation Age of Onset  . Diabetes type II Other    Family Psychiatric  History: See eval Social History:  Social History   Substance and Sexual Activity  Alcohol Use No     Social History   Substance and Sexual Activity  Drug Use No  . Types: Marijuana   Comment: Former user    Social History   Socioeconomic History  . Marital status: Legally Separated    Spouse name: Not on file  . Number of children: Not on file   . Years of education: Not on file  . Highest education level: Not on file  Occupational History  . Not on file  Social Needs  . Financial resource strain: Not on file  . Food insecurity    Worry: Not on file    Inability: Not on file  . Transportation needs    Medical: Not on file    Non-medical: Not on file  Tobacco Use  . Smoking status: Current Every Day Smoker    Packs/day: 0.50    Years: 17.00    Pack years: 8.50    Types: Cigarettes  . Smokeless tobacco: Never Used  Substance and Sexual Activity  . Alcohol use: No  . Drug use: No    Types: Marijuana    Comment: Former user  . Sexual activity: Never  Lifestyle  . Physical activity    Days per week: Not on file    Minutes per session: Not on file  . Stress: Not on file  Relationships  . Social Herbalist on phone: Not on file    Gets together: Not on file    Attends religious service: Not on file    Active member of club or organization: Not on file    Attends meetings of clubs or organizations:  Not on file    Relationship status: Not on file  Other Topics Concern  . Not on file  Social History Narrative   ** Merged History Encounter **       Additional Social History:                         Sleep: Good  Appetite:  Good  Current Medications: Current Facility-Administered Medications  Medication Dose Route Frequency Provider Last Rate Last Dose  . alum & mag hydroxide-simeth (MAALOX/MYLANTA) 200-200-20 MG/5ML suspension 30 mL  30 mL Oral Q6H PRN Rankin, Shuvon B, NP      . [employer] Treatment 1 packet  1 packet Topical Daily [name], MD   1 packet at 02/11/19 2221  . benztropine (COGENTIN) tablet 2 mg  2 mg Oral BID Sharma Covert, MD   2 mg at 02/12/19 1651  . carbamazepine (TEGRETOL XR) 12 hr tablet 200 mg  200 mg Oral Daily [name], MD   200 mg at 02/12/19 0740  . carbamazepine (TEGRETOL XR) 12 hr tablet 400 mg  400 mg Oral QHS Rankin, Shuvon B, NP   400  mg at 02/12/19 2105  . clindamycin (CLEOCIN) capsule 300 mg  300 mg Oral Q8H Sharma Covert, MD   300 mg at 02/12/19 2106  . gabapentin (NEURONTIN) capsule 300 mg  300 mg Oral TID [name], MD   300 mg at 02/12/19 1651  . haloperidol (HALDOL) tablet 20 mg  20 mg Oral QHS [name], MD   20 mg at 02/12/19 2105  . hydrOXYzine (ATARAX/VISTARIL) tablet 25 mg  25 mg Oral Q6H PRN Sharma Covert, MD   25 mg at 02/12/19 1952  . nicotine polacrilex (NICORETTE) gum 2 mg  2 mg Oral PRN [name], MD   2 mg at 02/12/19 1458  . primidone (MYSOLINE) tablet 50 mg  50 mg Oral TID [name], MD   50 mg at 02/12/19 1652  . traZODone (DESYREL) tablet 150 mg  150 mg Oral QHS Rankin, Shuvon B, NP      . zolpidem (AMBIEN) tablet 10 mg  10 mg Oral QHS PRN [name], MD   10 mg at 02/13/19 0204    Lab Results:  No results found for this or any previous visit (from the past 48 hour(s)).  Blood Alcohol level:  Lab Results  Component Value Date   ETH <10 02/05/2019   ETH <10 [id number]    Metabolic Disorder Labs: Lab Results  Component Value Date   HGBA1C 6.3 (H) 01/30/2017   MPG 134.11 01/30/2017   MPG 128 05/11/2015   No results found for: PROLACTIN Lab Results  Component Value Date   CHOL 157 01/29/2017   TRIG 119 01/29/2017   HDL 40 (L) 01/29/2017   CHOLHDL 3.9 01/29/2017   VLDL 24 01/29/2017   LDLCALC 93 01/29/2017   LDLCALC 97 05/11/2015    Physical Findings: AIMS: Facial and Oral Movements Muscles of Facial Expression: None, normal Lips and Perioral Area: None, normal Jaw: None, normal Tongue: None, normal,Extremity Movements Upper (arms, wrists, hands, fingers): None, normal Lower (legs, knees, ankles, toes): None, normal, Trunk Movements Neck, shoulders, hips: None, normal, Overall Severity Severity of abnormal movements (highest score from questions above): None, normal Incapacitation due to abnormal movements: None, normal Patient's awareness of abnormal  movements (rate only patient's report): No Awareness, Dental Status Current problems with teeth and/or dentures?: No Does patient  usually wear dentures?: No  CIWA:  CIWA-Ar Total: 1 COWS:  COWS Total Score: 1  Musculoskeletal: Strength & Muscle Tone: within normal limits Gait & Station: normal Patient leans: N/A  Psychiatric Specialty Exam: Physical Exam  ROS  Blood pressure 124/79, pulse 77, temperature 98.2 F (36.8 C), temperature source Oral, resp. rate 18, height 5\' 4"  (1.626 m), weight 114.3 kg, SpO2 100 %.Body mass index is 43.26 kg/m.  General Appearance: Casual  Eye Contact:  Good  Speech:  Normal Rate  Volume:  Normal  Mood:  Euthymic  Affect:  Congruent, Labile and Full Range  Thought Process:  Goal Directed  Orientation:  Full (Time, Place, and Person)  Thought Content:  Logical  Suicidal Thoughts:  neg  Homicidal Thoughts:  No  Memory:  Immediate;   Fair Recent;   Fair Remote generally intact  Judgement:  Fair  Insight:  Fair  Psychomotor Activity:  Normal  Concentration:  Concentration: Fair and Attention Span: Fair  Recall:  [employer] of Knowledge:  Fair  Language:  Fair  Akathisia:  Negative  Handed:  Right  AIMS (if indicated):     Assets:  Physical Health Resilience  ADL's:  Intact  Cognition:  WNL  Sleep:  Number of Hours: 6.25     Treatment Plan Summary: Daily contact with patient to assess and evaluate symptoms and progress in treatment and Medication management continue current reality based in cognitive therapies continue to monitor for safety.  Continue reduced dose of haloperidol and Mysoline which appear to have successfully gotten rid of pt's EPS.  Add Ingrezza to augment off label  Kimber Relic, Medical Student 02/13/2019, 8:58 AM

## 2019-02-13 NOTE — Progress Notes (Signed)
Pt attended spiritual care group   Spirituality group facilitated by Simone Curia, MDiv, BCC.  Group Description:  Group focused on topic of hope.  Patients participated in facilitated discussion around topic, connecting with one another around experiences and definitions for hope.  Group members engaged with visual explorer photos, reflecting on what hope looks like for them today.  Group engaged in discussion around how their definitions of hope are present today in hospital.   Modalities: Psycho-social ed, Adlerian, Narrative, MI Patient Progress: Rajene was engaged in group discussion.  Spoke of her awareness of hope coming from her experience with her grandmother and spoke of how she uses the values of love and honesty and wisdom to orient her life.  She is faithful and speaks of prayer as a resource.  In picking a picture to represent hope, she spoke vaguely of a concern for someone in her life and stated she is worried that "something could have happened and no one is telling me."   She was able to orient to care for herself in the hospital context and coping skills that allow her to feel comfort.

## 2019-02-14 DIAGNOSIS — F250 Schizoaffective disorder, bipolar type: Secondary | ICD-10-CM | POA: Diagnosis not present

## 2019-02-14 NOTE — Progress Notes (Signed)
Recreation Therapy Notes  Date: 10.28.20 Time: 1000 Location: 500 Hall Dayroom  Group Topic: Coping Skills  Goal Area(s) Addresses:  Patient will identify positive coping skills. Patient will identify benefits of using coping skills.  Intervention:  Worksheet, pencils  Activity:  Healthy vs Unhealthy Coping Strategies.  Patients were to identify a current problem they are facing.  Patients were to then identify unhealthy coping strategies used and the consequences.  Patients then identified healthy coping strategies, expected outcomes and barriers to using healthy coping strategies.    Education: Radiographer, therapeutic, Dentist.   Education Outcome: Acknowledges understanding/In group clarification offered/Needs additional education.   Clinical Observations/Feedback: Pt did not attend group.    [name], LRT/CTRS    [name] A 02/14/2019 12:49 PM

## 2019-02-14 NOTE — Progress Notes (Signed)
[facility] MD Progress Note  02/14/2019 9:34 AM [name]  MRN:  [id number] Subjective:    Patient reports that she is doing well today and is ready to go home.  She denies any AVH, SI, or HI.  She reports she has been sleeping and eating well and is lying in bed during our interview this am.  Her tremor and head bobbing have returned. UDS done late but neg Principal Problem: Exacerbation of psychotic disorder  Diagnosis: Active Problems:   Schizophrenia ([place])  Total Time spent with patient: 20 minutes  Past Psychiatric History: See eval  Past Medical History:  Past Medical History:  Diagnosis Date  . Arthritis   . Bipolar 1 disorder ([place])   . Bronchitis   . Depression   . Hemorrhoid   . Schizoaffective disorder ([place])   . Schizophrenia ([place])    Pt denies and reports it is schizoaffective disorder.   . Transfusion history     Past Surgical History:  Procedure Laterality Date  . CESAREAN SECTION     Family History:  Family History  Problem Relation Age of Onset  . Diabetes type II Other    Family Psychiatric  History: See eval Social History:  Social History   Substance and Sexual Activity  Alcohol Use No     Social History   Substance and Sexual Activity  Drug Use No  . Types: Marijuana   Comment: Former user    Social History   Socioeconomic History  . Marital status: Legally Separated    Spouse name: Not on file  . Number of children: Not on file  . Years of education: Not on file  . Highest education level: Not on file  Occupational History  . Not on file  Social Needs  . Financial resource strain: Not on file  . Food insecurity    Worry: Not on file    Inability: Not on file  . Transportation needs    Medical: Not on file    Non-medical: Not on file  Tobacco Use  . Smoking status: Current Every Day Smoker    Packs/day: 0.50    Years: 17.00    Pack years: 8.50    Types: Cigarettes  . Smokeless tobacco: Never Used  Substance and Sexual  Activity  . Alcohol use: No  . Drug use: No    Types: Marijuana    Comment: Former user  . Sexual activity: Never  Lifestyle  . Physical activity    Days per week: Not on file    Minutes per session: Not on file  . Stress: Not on file  Relationships  . Social Herbalist on phone: Not on file    Gets together: Not on file    Attends religious service: Not on file    Active member of club or organization: Not on file    Attends meetings of clubs or organizations: Not on file    Relationship status: Not on file  Other Topics Concern  . Not on file  Social History Narrative   ** Merged History Encounter **       Additional Social History:                         Sleep: Good  Appetite:  Good  Current Medications: Current Facility-Administered Medications  Medication Dose Route Frequency Provider Last Rate Last Dose  . alum & mag hydroxide-simeth (MAALOX/MYLANTA) 200-200-20 MG/5ML suspension 30 mL  30 mL Oral Q6H PRN Rankin, Shuvon B, NP      . [employer] Treatment 1 packet  1 packet Topical Daily [name], MD   1 packet at 02/11/19 2221  . benztropine (COGENTIN) tablet 2 mg  2 mg Oral BID Sharma Covert, MD   2 mg at 02/13/19 1742  . carbamazepine (TEGRETOL XR) 12 hr tablet 200 mg  200 mg Oral Daily [name], MD   200 mg at 02/13/19 1742  . carbamazepine (TEGRETOL XR) 12 hr tablet 400 mg  400 mg Oral QHS Rankin, Shuvon B, NP   400 mg at 02/13/19 2114  . clindamycin (CLEOCIN) capsule 300 mg  300 mg Oral Q8H Sharma Covert, MD   300 mg at 02/13/19 2113  . gabapentin (NEURONTIN) capsule 300 mg  300 mg Oral TID [name], MD   300 mg at 02/13/19 1742  . haloperidol (HALDOL) tablet 20 mg  20 mg Oral QHS [name], MD   20 mg at 02/13/19 2113  . hydrOXYzine (ATARAX/VISTARIL) tablet 25 mg  25 mg Oral Q6H PRN Sharma Covert, MD   25 mg at 02/12/19 1952  . nicotine polacrilex (NICORETTE) gum 2 mg  2 mg Oral PRN [name], MD   2  mg at 02/12/19 1458  . primidone (MYSOLINE) tablet 50 mg  50 mg Oral TID [name], MD   50 mg at 02/13/19 1742  . traZODone (DESYREL) tablet 150 mg  150 mg Oral QHS Rankin, Shuvon B, NP      . Valbenazine Tosylate CAPS 40 mg  40 mg Oral Daily [name], MD   40 mg at 02/13/19 1237  . zolpidem (AMBIEN) tablet 10 mg  10 mg Oral QHS PRN [name], MD   10 mg at 02/13/19 2217    Lab Results:  No results found for this or any previous visit (from the past 61 hour(s)).  Blood Alcohol level:  Lab Results  Component Value Date   ETH <10 02/05/2019   ETH <10 [id number]    Metabolic Disorder Labs: Lab Results  Component Value Date   HGBA1C 6.3 (H) 01/30/2017   MPG 134.11 01/30/2017   MPG 128 05/11/2015   No results found for: PROLACTIN Lab Results  Component Value Date   CHOL 157 01/29/2017   TRIG 119 01/29/2017   HDL 40 (L) 01/29/2017   CHOLHDL 3.9 01/29/2017   VLDL 24 01/29/2017   LDLCALC 93 01/29/2017   LDLCALC 97 05/11/2015    Physical Findings: AIMS: Facial and Oral Movements Muscles of Facial Expression: None, normal Lips and Perioral Area: None, normal Jaw: None, normal Tongue: None, normal,Extremity Movements Upper (arms, wrists, hands, fingers): None, normal Lower (legs, knees, ankles, toes): None, normal, Trunk Movements Neck, shoulders, hips: None, normal, Overall Severity Severity of abnormal movements (highest score from questions above): None, normal Incapacitation due to abnormal movements: None, normal Patient's awareness of abnormal movements (rate only patient's report): No Awareness, Dental Status Current problems with teeth and/or dentures?: No Does patient usually wear dentures?: No  CIWA:  CIWA-Ar Total: 1 COWS:  COWS Total Score: 1  Musculoskeletal: Strength & Muscle Tone: within normal limits Gait & Station: normal Patient leans: N/A  Psychiatric Specialty Exam: Physical Exam  ROS  Blood pressure 124/79, pulse 77, temperature 98.2  F (36.8 C), temperature source Oral, resp. rate 18, height 5\' 4"  (1.626 m), weight 114.3 kg, SpO2 100 %.Body mass index is 43.26 kg/m.  General Appearance: Casual  Eye Contact:  Good  Speech:  Normal Rate  Volume:  Normal  Mood:  Euthymic  Affect:  Congruent, Labile and Full Range  Thought Process:  Goal Directed  Orientation:  Full (Time, Place, and Person)  Thought Content:  Logical  Suicidal Thoughts:  neg  Homicidal Thoughts:  No  Memory:  Immediate;   Fair Recent;   Fair Remote generally intact  Judgement:  Fair  Insight:  Fair  Psychomotor Activity:  Normal  Concentration:  Concentration: Fair and Attention Span: Fair  Recall:  [employer] of Knowledge:  Fair  Language:  Fair  Akathisia:  Negative  Handed:  Right  AIMS (if indicated):     Assets:  Physical Health Resilience  ADL's:  Intact  Cognition:  WNL  Sleep:  Number of Hours: 7     Treatment Plan Summary: Daily contact with patient to assess and evaluate symptoms and progress in treatment and Medication management continue current reality based in cognitive therapies continue to monitor for safety. Add Ingrezza to augment off label. Patient is experiencing EPS again; can consider benefit of switching from haloperidol to another agent with lower risk of EPS vs benefit of staying on haloperidol given that her psychosis is well controlled on this and her history of EPS with multiple different antipsychotic agents in the past.  Kimber Relic, Medical Student 02/14/2019, 9:34 AM

## 2019-02-14 NOTE — Progress Notes (Signed)
Adult Psychoeducational Group Note  Date:  02/14/2019 Time:  12:07 AM  Group Topic/Focus:  Wrap-Up Group:   The focus of this group is to help patients review their daily goal of treatment and discuss progress on daily workbooks.  Participation Level:  Did Not Attend  Participation Quality:  Did Not Attend  Affect:  Did Not Attend  Cognitive:  Did Not Attend  Insight: None  Engagement in Group:  Did Not Attend  Modes of Intervention:  Did Not Attend  Additional Comments:  Pt did not attend evening wrap up group tonight.   Candy Sledge 02/14/2019, 12:07 AM

## 2019-02-14 NOTE — Progress Notes (Signed)
D: Pt kept to herself this evening. Pt joking with writer this evening. Pt appeared depressed this evening, but was pleasant toward staff.  A: Pt was offered support and encouragement. Pt was given scheduled medications. Pt was encourage to attend groups. Q 15 minute checks were done for safety.  R: safety maintained on unit.

## 2019-02-15 DIAGNOSIS — F250 Schizoaffective disorder, bipolar type: Secondary | ICD-10-CM | POA: Diagnosis not present

## 2019-02-15 MED ORDER — GABAPENTIN 300 MG PO CAPS: 300.0000 mg | ORAL_CAPSULE | Freq: Three times a day (TID) | ORAL | 11 refills | Status: DC

## 2019-02-15 MED ORDER — HALOPERIDOL DECANOATE 100 MG/ML IM SOLN
75.0000 mg | Freq: Once | INTRAMUSCULAR | Status: AC
  Administered 2019-02-15: 75 mg via INTRAMUSCULAR
  Filled 2019-02-15: qty 0.75

## 2019-02-15 MED ORDER — CLINDAMYCIN HCL 300 MG PO CAPS: 300.0000 mg | ORAL_CAPSULE | Freq: Three times a day (TID) | ORAL | 0 refills | Status: DC

## 2019-02-15 MED ORDER — HALOPERIDOL DECANOATE 50 MG/ML IM SOLN: 75.0000 mg | INTRAMUSCULAR | Status: DC

## 2019-02-15 MED ORDER — HALOPERIDOL 5 MG PO TABS: 20.0000 mg | ORAL_TABLET | Freq: Every day | ORAL | 11 refills | Status: DC

## 2019-02-15 MED ORDER — BENZTROPINE MESYLATE 2 MG PO TABS: 2.0000 mg | ORAL_TABLET | Freq: Two times a day (BID) | ORAL | 11 refills | Status: DC

## 2019-02-15 MED ORDER — PRIMIDONE 50 MG PO TABS: 50.0000 mg | ORAL_TABLET | Freq: Three times a day (TID) | ORAL | 11 refills | Status: DC

## 2019-02-15 MED ORDER — CARBAMAZEPINE ER 200 MG PO TB12: 200.0000 mg | ORAL_TABLET | Freq: Every day | ORAL | 2 refills | Status: DC

## 2019-02-15 MED ORDER — HALOPERIDOL DECANOATE 100 MG/ML IM SOLN: 75.0000 mg | Freq: Once | INTRAMUSCULAR | 11 refills | Status: DC

## 2019-02-15 NOTE — BHH Suicide Risk Assessment (Signed)
[facility] Discharge Suicide Risk Assessment   Principal Problem: <principal problem not specified> Discharge Diagnoses: Active Problems:   Schizophrenia ([place])   Total Time spent with patient: 45 minutes  Musculoskeletal: Strength & Muscle Tone: within normal limits Gait & Station: normal Patient leans: N/A  Psychiatric Specialty Exam: Physical Exam  ROS  Blood pressure 124/79, pulse 77, temperature 98.2 F (36.8 C), temperature source Oral, resp. rate 18, height 5\' 4"  (1.626 m), weight 114.3 kg, SpO2 100 %.Body mass index is 43.26 kg/m.  General Appearance: Casual  Eye Contact:  Good  Speech:  Clear and Coherent  Volume:  Decreased  Mood:  Euthymic  Affect:  Constricted  Thought Process:  Linear and Descriptions of Associations: Intact  Orientation:  Full (Time, Place, and Person)  Thought Content:  Logical  Suicidal Thoughts:  No  Homicidal Thoughts:  No  Memory:  Immediate;   Fair Recent;   Fair Remote;   Fair  Judgement:  Good  Insight:  Fair  Psychomotor Activity:  Normal  Concentration:  Concentration: Good and Attention Span: Good  Recall:  Good  Fund of Knowledge:  Good  Language:  Fair  Akathisia:  NA  Handed:  Right  AIMS (if indicated):     Assets:  Resilience  ADL's:  Intact  Cognition:  WNL  Sleep:  Number of Hours: 8.25    Mental Status Per Nursing Assessment::   On Admission:  NA  Demographic Factors:  Low socioeconomic status and Living alone  Loss Factors:n/a Historical Factors: NA  Risk Reduction Factors:   Positive therapeutic relationship and Positive coping skills or problem solving skills  Continued Clinical Symptoms:  Previous Psychiatric Diagnoses and Treatments  Cognitive Features That Contribute To Risk:  Polarized thinking    Suicide Risk:  Minimal: No identifiable suicidal ideation.  Patients presenting with no risk factors but with morbid ruminations; may be classified as minimal risk based on the severity of the depressive  symptoms  Follow-up Information    [facility] Follow up.   Why: Patient is declining for ACTT servces, however, Dr. [name] with her ACTT will follow up with her on Saturday, 10/31.  Contact information: [place] [address] [phone]           Plan Of Care/Follow-up recommendations:  Activity:  full  Jasmin Winberry, MD 02/15/2019, 9:53 AM

## 2019-02-15 NOTE — Progress Notes (Signed)
Recreation Therapy Notes  Date: 10.29.20 Time: 1000 Location: 500 Hall Dayroom  Group Topic: Leisure Education  Goal Area(s) Addresses:  Patient will identify positive leisure activities.  Patient will identify one positive benefit of participation in leisure activities.   Behavioral Response: Engaged  Intervention: Leisure Group Game  Activity: Pictionary.  Patients were split into 2 groups.  One person from the group would pick a word from the container and draw it on the board.  Their team has one minute to guess what the picture is.  If they guess correctly, they get the point.  If they don't guess it, the other team gets a chance to steal the point.  Education:  Leisure Education, Dentist  Education Outcome: Acknowledges education/In group clarification offered/Needs additional education  Clinical Observations/Feedback: Pt attentive and active during activity.  Pt worked well with peers.  Pt left early for discharge.   [name], LRT/CTRS         [name] A 02/15/2019 11:54 AM

## 2019-02-15 NOTE — Progress Notes (Signed)
Recreation Therapy Notes  INPATIENT RECREATION TR PLAN  Patient Details Name: [name] MRN: [id number] DOB: April 01, 1971 Today's Date: 02/15/2019  Rec Therapy Plan Is patient appropriate for Therapeutic Recreation?: Yes Treatment times per week: about 3 days Estimated Length of Stay: 5-7 days TR Treatment/Interventions: Group participation (Comment)  Discharge Criteria Pt will be discharged from therapy if:: Discharged Treatment plan/goals/alternatives discussed and agreed upon by:: Patient/family  Discharge Summary Short term goals set: See patient care plan Short term goals met: Not met Progress toward goals comments: Groups attended Which groups?: Communication, Other (Comment)(Team building) Reason goals not met: Pt participated in one group activity. Therapeutic equipment acquired: N/A Reason patient discharged from therapy: Discharge from hospital Pt/family agrees with progress & goals achieved: Yes Date patient discharged from therapy: 02/15/19    [name], LRT/CTRS  Ria Comment, Shaddai Shapley A 02/15/2019, 9:27 AM

## 2019-02-15 NOTE — Progress Notes (Signed)
Patient ID: [name], female   DOB: January 04, 1971, 48 y.o.   MRN: [id number]   CSW attempted to get patient to sign for [facility] team. Patient refused.  CSW talked with Dr. [name]. Dr. [name] stated that he will contact the ACTT team and that he will personally be going out to see her on Saturday.

## 2019-02-15 NOTE — Progress Notes (Signed)
Patient ID: [name], female   DOB: June 01, 1970, 48 y.o.   MRN: [id number]   CSW scheduled kaizen lyft for 10:30am for patient. Pt's nurse was notified.

## 2019-02-15 NOTE — Progress Notes (Addendum)
  [facility] Adult Case Management Discharge Plan :  Will you be returning to the same living situation after discharge:  Yes,  pt's home  At discharge, do you have transportation home?: Yes,  Lyft Do you have the ability to pay for your medications: Yes,  medicaid   Release of information consent forms completed and in the chart;  Patient's signature needed at discharge.  Patient to Follow up at: Follow-up Information    [facility] Follow up.   Why: Patient is declining for ACTT servces, however, Dr. [name] with her ACTT will follow up with her on Saturday, 10/31.  Contact information: [place] [address] [phone]           Next level of care provider has access to [place] and Suicide Prevention discussed: No. with pt; pt declined      Has patient been referred to the Quitline?: Patient refused referral  Patient has been referred for addiction treatment: Pt. refused referral  Billey Chang, Student-Social Work 02/15/2019, 9:38 AM

## 2019-02-15 NOTE — Plan of Care (Signed)
Pt needed continued prompting during recreation therapy group sessions but only participated in one group.   [name], LRT/CTRS

## 2019-02-15 NOTE — Discharge Summary (Signed)
Physician Discharge Summary Note  Patient:  [name] is an 48 y.o., female MRN:  [id number] DOB:  09-28-1970 Patient phone:  [phone] (home)  Patient address:   [place] [id number],  Total Time spent with patient: 45 minutes  Date of Admission:  02/06/2019 Date of Discharge: 02/15/2019  Reason for Admission:   This is a repeat admission for [name], the latest numerous and the first since her discharge from here on 7/31. Patient is known to have a schizoaffective/bipolar type condition that is somewhat treatment resistant and there have been numerous medication trials with limited success.  (Long-acting injectable Abilify, iloperidone, Prolixin, Haldol, Trilafon, Seroquel, more recently Rexulti have all been tried) One of the issues is compliance, there is the possibility of substance abuse, she has had drug screens in the past positive for cannabis -lastly on 10/8, but she denies other usage and denies recent usage.  States she cannot urinate now and will not give a drug screen.  She actually called EMS to her home, she had phoned even the police as well, she believes there was some individual on the steps with a gun waiting for her son.  She believes her son is involved in gang activity and he has disappeared.  Further when the police came there was no one on the steps with a gun and she states "they did not take me seriously" citing her mental illness, believing she was simply paranoid.  She is [facility] by [facility] and since her last discharge she has never really stabilized outside the hospital.  Compliance has been questionable.  She is reported continued auditory hallucinations.  Her way of coping was to sleep.  She is also obsessed with her living situation stating it floods in her apartment that things are full of mildew, and molded and apparently she has had this investigated by the apartment staff who find that though she is  in a basement level apartment and it is somewhat damp there is no specific flooding-  Current mental status exam involves being fully alert oriented eye contact good speech normal rate and tone somewhat dysphoric and focused on her son's safety and the piece of truth in this is that he may indeed be involved in gang activity at any rate the patient has excessive worry about this possibly delusional believes reports continued auditory hallucinations telling her various things that are negative but she will not be more specific.  She denies visual hallucinations.  Denies thoughts of harming self or others No involuntary movements but she has a history of significant EPS when on Prolixin   Principal Problem: <principal problem not specified> Discharge Diagnoses: Active Problems:   Schizophrenia [facility])   Past Psychiatric History: Extensive generally treatment resistant  Past Medical History:  Past Medical History:  Diagnosis Date  . Arthritis   . Bipolar 1 disorder ([place])   . Bronchitis   . Depression   . Hemorrhoid   . Schizoaffective disorder ([place])   . Schizophrenia ([place])    Pt denies and reports it is schizoaffective disorder.   . Transfusion history     Past Surgical History:  Procedure Laterality Date  . CESAREAN SECTION     Family History:  Family History  Problem Relation Age of Onset  . Diabetes type II Other    Family Psychiatric  History: Denies Social History:  Social History   Substance and Sexual Activity  Alcohol Use No  Social History   Substance and Sexual Activity  Drug Use No  . Types: Marijuana   Comment: Former user    Social History   Socioeconomic History  . Marital status: Legally Separated    Spouse name: Not on file  . Number of children: Not on file  . Years of education: Not on file  . Highest education level: Not on file  Occupational History  . Not on file  Social Needs  . Financial resource strain: Not on file  . Food insecurity     Worry: Not on file    Inability: Not on file  . Transportation needs    Medical: Not on file    Non-medical: Not on file  Tobacco Use  . Smoking status: Current Every Day Smoker    Packs/day: 0.50    Years: 17.00    Pack years: 8.50    Types: Cigarettes  . Smokeless tobacco: Never Used  Substance and Sexual Activity  . Alcohol use: No  . Drug use: No    Types: Marijuana    Comment: Former user  . Sexual activity: Never  Lifestyle  . Physical activity    Days per week: Not on file    Minutes per session: Not on file  . Stress: Not on file  Relationships  . Social Herbalist on phone: Not on file    Gets together: Not on file    Attends religious service: Not on file    Active member of club or organization: Not on file    Attends meetings of clubs or organizations: Not on file    Relationship status: Not on file  Other Topics Concern  . Not on file  Social History Narrative   ** Merged History [facility] Course:    As discussed patient refused to give a drug screen we ordered a blood test and that of course was not possible because the sample had been taken too long ago, she eventually did give a urine drug screen that was negative on 10/23-however a visitor dropped off some clothing for her they did have marijuana in the pockets.  From a psychiatric standpoint she reported auditory hallucinations until her last 48 hours we used Haldol at 20 mg a day the hallucinations resolved for the most part but an additional 5 mg caused EPS.  We used combination of Mysoline and Cogentin to treat the movement disorder.  She tended to stay to herself she participated somewhat individually by the date of the 29th she was noted to be alert oriented cooperative no auditory or visual hallucinations contracting fully and requesting discharge home.  Because she acknowledged poor compliance we did go ahead and administer long-acting injectable Haldol 75 mg IM  on 10/29    Physical Findings: AIMS: Facial and Oral Movements Muscles of Facial Expression: None, normal Lips and Perioral Area: None, normal Jaw: None, normal Tongue: None, normal,Extremity Movements Upper (arms, wrists, hands, fingers): None, normal Lower (legs, knees, ankles, toes): None, normal, Trunk Movements Neck, shoulders, hips: None, normal, Overall Severity Severity of abnormal movements (highest score from questions above): None, normal Incapacitation due to abnormal movements: None, normal Patient's awareness of abnormal movements (rate only patient's report): No Awareness, Dental Status Current problems with teeth and/or dentures?: No Does patient usually wear dentures?: No  CIWA:  CIWA-Ar Total: 1 COWS:  COWS Total Score: 1  Musculoskeletal: Strength & Muscle Tone: within normal limits  Gait & Station: normal Patient leans: N/A  Psychiatric Specialty Exam: Physical Exam  ROS  Blood pressure 124/79, pulse 77, temperature 98.2 F (36.8 C), temperature source Oral, resp. rate 18, height 5\' 4"  (1.626 m), weight 114.3 kg, SpO2 100 %.Body mass index is 43.26 kg/m.  General Appearance: Casual  Eye Contact:  Good  Speech:  Clear and Coherent  Volume:  Decreased  Mood:  Euthymic  Affect:  Constricted  Thought Process:  Linear and Descriptions of Associations: Intact  Orientation:  Full (Time, Place, and Person)  Thought Content:  Logical  Suicidal Thoughts:  No  Homicidal Thoughts:  No  Memory:  Immediate;   Fair Recent;   Fair Remote;   Fair  Judgement:  Good  Insight:  Fair  Psychomotor Activity:  Normal  Concentration:  Concentration: Good and Attention Span: Good  Recall:  Good  Fund of Knowledge:  Good  Language:  Fair  Akathisia:  NA  Handed:  Right  AIMS (if indicated):     Assets:  Resilience  ADL's:  Intact  Cognition:  WNL  Sleep:  Number of Hours: 8.25        Has this patient used any form of tobacco in the last 30 days? (Cigarettes,  Smokeless Tobacco, Cigars, and/or Pipes) Yes, No  Blood Alcohol level:  Lab Results  Component Value Date   ETH <10 02/05/2019   ETH <10 [id number]    Metabolic Disorder Labs:  Lab Results  Component Value Date   HGBA1C 6.3 (H) 01/30/2017   MPG 134.11 01/30/2017   MPG 128 05/11/2015   No results found for: PROLACTIN Lab Results  Component Value Date   CHOL 157 01/29/2017   TRIG 119 01/29/2017   HDL 40 (L) 01/29/2017   CHOLHDL 3.9 01/29/2017   VLDL 24 01/29/2017   [place] 93 01/29/2017   LDLCALC 97 05/11/2015    See Psychiatric Specialty Exam and Suicide Risk Assessment completed by Attending Physician prior to discharge.  Discharge destination:  Home  Is patient on multiple antipsychotic therapies at discharge:  No   Has Patient had three or more failed trials of antipsychotic monotherapy by history:  No  Recommended Plan for Multiple Antipsychotic Therapies: NA   Allergies as of 02/15/2019      Reactions   Latuda [lurasidone Hcl] Nausea And Vomiting, Other (See Comments)   Reaction:  Shaking    Abilify [aripiprazole] Other (See Comments)   shaking   Flagyl [metronidazole Hcl] Nausea And Vomiting   Prolixin [fluphenazine] Other (See Comments)   shaking   Risperidone And Related Other (See Comments)   shaking   Seroquel [quetiapine Fumarate]    Patient states she had a syncopal episode after taking medication.    Sulfa Antibiotics Nausea And Vomiting      Medication List    STOP taking these medications   baclofen 10 MG tablet Commonly known as: LIORESAL   Caplyta 42 MG Caps Generic drug: Lumateperone Tosylate   mirtazapine 15 MG tablet Commonly known as: REMERON   perphenazine 4 MG tablet Commonly known as: TRILAFON   risperiDONE 3 MG tablet Commonly known as: RISPERDAL   traZODone 150 MG tablet Commonly known as: DESYREL     TAKE these medications     Indication  benztropine 2 MG tablet Commonly known as: COGENTIN Take 1 tablet (2 mg  total) by mouth 2 (two) times daily. What changed:   medication strength  how much to take  Indication: Extrapyramidal Reaction caused by Medications  carbamazepine 200 MG 12 hr tablet Commonly known as: TEGRETOL XR Take 1 tablet (200 mg total) by mouth daily. Start taking on: February 16, 2019 What changed:   how much to take  when to take this  Indication: Manic-Depression   clindamycin 300 MG capsule Commonly known as: CLEOCIN Take 1 capsule (300 mg total) by mouth every 8 (eight) hours.  Indication: Pelvic Inflammatory Disease   gabapentin 300 MG capsule Commonly known as: NEURONTIN Take 1 capsule (300 mg total) by mouth 3 (three) times daily.  Indication: Social Anxiety Disorder   haloperidol 5 MG tablet Commonly known as: HALDOL Take 4 tablets (20 mg total) by mouth at bedtime.  Indication: Manic Phase of Manic-Depression   haloperidol decanoate 100 MG/ML injection Commonly known as: HALDOL DECANOATE Inject 0.75 mLs (75 mg total) into the muscle once for 1 dose.  Indication: Manic Phase of Manic-Depression   multivitamin with minerals Tabs tablet Take 1 tablet by mouth daily.  Indication: Deficiency of Folic Acid   primidone 50 MG tablet Commonly known as: MYSOLINE Take 1 tablet (50 mg total) by mouth 3 (three) times daily.  Indication: Fine to Coarse Slow Tremor affecting Head, Hands & Voice      Follow-up Information    [facility] Follow up.   Why: Patient is declining for ACTT servces, however, Dr. [name] with her ACTT will follow up with her on Saturday, 10/31.  Contact information: [address] [place] [address] [phone]           Signed: [name], MD 02/15/2019, 9:43 AM

## 2019-02-18 ENCOUNTER — Emergency Department (HOSPITAL_COMMUNITY)
Admission: EM | Admit: 2019-02-18 | Discharge: 2019-02-18 | Disposition: A | Discharge status: Home / Self Care | Payer: Medicaid Other | Attending: Emergency Medicine | Admitting: Emergency Medicine

## 2019-02-18 ENCOUNTER — Encounter (HOSPITAL_COMMUNITY): Payer: Self-pay

## 2019-02-18 ENCOUNTER — Emergency Department (HOSPITAL_COMMUNITY): Payer: Medicaid Other

## 2019-02-18 ENCOUNTER — Other Ambulatory Visit: Payer: Self-pay

## 2019-02-18 DIAGNOSIS — Z79899 Other long term (current) drug therapy: Secondary | ICD-10-CM | POA: Diagnosis not present

## 2019-02-18 DIAGNOSIS — R251 Tremor, unspecified: Secondary | ICD-10-CM | POA: Diagnosis not present

## 2019-02-18 DIAGNOSIS — F17210 Nicotine dependence, cigarettes, uncomplicated: Secondary | ICD-10-CM | POA: Insufficient documentation

## 2019-02-18 DIAGNOSIS — M79601 Pain in right arm: Secondary | ICD-10-CM

## 2019-02-18 DIAGNOSIS — M25521 Pain in right elbow: Secondary | ICD-10-CM | POA: Insufficient documentation

## 2019-02-18 DIAGNOSIS — I10 Essential (primary) hypertension: Secondary | ICD-10-CM | POA: Insufficient documentation

## 2019-02-18 MED ORDER — IBUPROFEN 800 MG PO TABS
800.0000 mg | ORAL_TABLET | Freq: Once | ORAL | Status: AC
  Administered 2019-02-18: 800 mg via ORAL
  Filled 2019-02-18: qty 1

## 2019-02-18 MED ORDER — BENZTROPINE MESYLATE 2 MG PO TABS: 2.0000 mg | ORAL_TABLET | Freq: Two times a day (BID) | ORAL | 0 refills | Status: DC

## 2019-02-18 NOTE — Discharge Instructions (Signed)
Please use cold compresses on your elbow for the next several days recommended and in the attached instructions. Please follow up with Dr. [name] for any additional medications. I have supplied you with 3 days of cogentin.

## 2019-02-18 NOTE — ED Provider Notes (Signed)
[facility] DEPT Provider Note   CSN: [id number] Arrival date & time: 02/18/19  [id number]     History   Chief Complaint Chief Complaint  Patient presents with   Arm Pain    HPI [name] is a 48 y.o. female severe depression, schizophrenia, bipolar 1, schizoaffective     HPI  Patient presents for right elbow pain for 2 days.  Patient describes pain as sharp, achy, constant, worse with movement with no mitigating factors.  States that 2 days ago she had a Haldol shot and several hours later began feeling pain in her elbow.  Denies any trauma or injury to the area. Patient states she feels otherwise well with no fever, chills, headache or other issues.   On chart review patient sees Dr. [name] for psychiatric treatment.  Was discharged 02/15/19 after psych evaluation and depo Haldol shot.   Past Medical History:  Diagnosis Date   Arthritis    Bipolar 1 disorder ([employer])    Bronchitis    Depression    Hemorrhoid    Schizoaffective disorder ([place])    Schizophrenia ([place])    Pt denies and reports it is schizoaffective disorder.    Transfusion history     Patient Active Problem List   Diagnosis Date Noted   Schizophrenia ([place]) 02/07/2019   Schizoaffective disorder ([place]) 11/08/2018   No diagnosis on Axis I 08/21/2015   Abnormal behavior    Involuntary commitment    Psychosis ([place])    Disturbance in affect    Tobacco use disorder 05/12/2015   Cannabis use disorder, moderate, dependence ([place]) 05/12/2015   Schizo affective schizophrenia ([place]) 05/10/2015   Hypertension 02/24/2011   ANEMIA-IRON DEFICIENCY 12/13/2006   GERD 12/13/2006   OBESITY 12/12/2006   Schizoaffective disorder, bipolar type ([place]) 12/12/2006   HEMORRHOIDS, NOS 12/12/2006   PAIN-NECK 12/12/2006   TREMOR 12/12/2006    Past Surgical History:  Procedure Laterality Date   CESAREAN SECTION       OB History   No obstetric history on file.       Home Medications    Prior to Admission medications   Medication Sig Start Date End Date Taking? Authorizing Provider  benztropine (COGENTIN) 2 MG tablet Take 1 tablet (2 mg total) by mouth 2 (two) times daily for 3 days. 02/18/19 02/21/19  Tedd Sias, PA  carbamazepine (TEGRETOL XR) 200 MG 12 hr tablet Take 1 tablet (200 mg total) by mouth daily. 02/16/19   [name], MD  clindamycin (CLEOCIN) 300 MG capsule Take 1 capsule (300 mg total) by mouth every 8 (eight) hours. 02/15/19   [name], MD  gabapentin (NEURONTIN) 300 MG capsule Take 1 capsule (300 mg total) by mouth 3 (three) times daily. 02/15/19   [name], MD  haloperidol (HALDOL) 5 MG tablet Take 4 tablets (20 mg total) by mouth at bedtime. 02/15/19   [name], MD  haloperidol decanoate (HALDOL DECANOATE) 100 MG/ML injection Inject 0.75 mLs (75 mg total) into the muscle once for 1 dose. 02/15/19 02/15/19  [name], MD  Multiple Vitamin (MULTIVITAMIN WITH MINERALS) TABS tablet Take 1 tablet by mouth daily.    [provider]  primidone (MYSOLINE) 50 MG tablet Take 1 tablet (50 mg total) by mouth 3 (three) times daily. 02/15/19   [name], MD    Family History Family History  Problem Relation Age of Onset   Diabetes type II Other     Social History Social History   Tobacco  Use   Smoking status: Current Every Day Smoker    Packs/day: 0.50    Years: 17.00    Pack years: 8.50    Types: Cigarettes   Smokeless tobacco: Never Used  Substance Use Topics   Alcohol use: No   Drug use: No    Types: Marijuana    Comment: Former user     Allergies   Latuda [lurasidone hcl], Abilify [aripiprazole], Flagyl [metronidazole hcl], Prolixin [fluphenazine], Risperidone and related, Seroquel [quetiapine fumarate], and Sulfa antibiotics   Review of Systems Review of Systems  Constitutional: Negative for fever.  Respiratory: Negative for shortness of breath.   Cardiovascular: Negative for  chest pain.  Musculoskeletal:       Right elbow pain  Neurological: Negative for headaches.     Physical Exam Updated Vital Signs BP (!) 147/113 (BP Location: Left Arm) Comment: informed the nurse of b/p   Pulse 85    Temp 98.3 F (36.8 C) (Oral)    Resp 18    LMP 01/17/2019    SpO2 99%   Physical Exam Vitals signs and nursing note reviewed.  Constitutional:      General: She is not in acute distress.    Appearance: Normal appearance. She is not ill-appearing.  HENT:     Head: Normocephalic and atraumatic.  Eyes:     General: No scleral icterus.       Right eye: No discharge.        Left eye: No discharge.     Conjunctiva/sclera: Conjunctivae normal.  Cardiovascular:     Pulses: Normal pulses.     Comments: Bilateral radial pulses intact Pulmonary:     Effort: Pulmonary effort is normal.     Breath sounds: No stridor.  Musculoskeletal:     Comments: Tenderness to palpation to the lateral right epicondyle Patient has full range of motion of right arm, grip strength intact 5/5 able to finger-thumb oppose, spread fingers.   Skin:    General: Skin is warm and dry.     Comments: 3 cm circular area of erythema over elbow.  Neurological:     Mental Status: She is alert and oriented to person, place, and time. Mental status is at baseline.     Comments: Median, radial, ulnar nerve distribution sensation intact.      ED Treatments / Results  Labs (all labs ordered are listed, but only abnormal results are displayed) Labs Reviewed - No data to display  EKG None  Radiology Dg Elbow Complete Right  Result Date: 02/18/2019 CLINICAL DATA:  Right arm pain following an injection two days ago. EXAM: RIGHT ELBOW - COMPLETE 3+ VIEW COMPARISON:  None. FINDINGS: There is no evidence of fracture, dislocation, or joint effusion. There is no evidence of arthropathy or other focal bone abnormality. Soft tissues are unremarkable. IMPRESSION: Normal examination. Electronically Signed   By:  Claudie Revering M.D.   On: 02/18/2019 09:09    Procedures Procedures (including critical care time)  Medications Ordered in ED Medications  ibuprofen (ADVIL) tablet 800 mg (800 mg Oral Given 02/18/19 0856)     Initial Impression / Assessment and Plan / ED Course  I have reviewed the triage vital signs and the nursing notes.  Pertinent labs & imaging results that were available during my care of the patient were reviewed by me and considered in my medical decision making (see chart for details).        Patient has reassuring physical exam with small area of erythema  that does not appear cellulitic, there is no fluctuance, no crepitus, no bruising.  Doubt fracture but will proceed with x-ray to limited as patient is poor historian and has history of tremors and may have traumatically injured area.  Doubt injuries related to Haldol Depo shot.  Pain occurred several hours later and is far removed from the area of injection.  She has good pulses doubt arterial injury, there is no swelling and patient has no history of venous thromboembolism--doubt vascular injury emergency.   Mall area of redness noted on physical exam is likely due to patient consistently rubbing the area.  Doubt septic arthritis or infection of skin.  X-rays negative for fracture.  Patient requested refill of Cogentin.  Patient is currently prescribed Cogentin by Dr. [name] will provide patient with 3 days and recommend close follow-up.  Final Clinical Impressions(s) / ED Diagnoses   Final diagnoses:  Right arm pain  Tremor    ED Discharge Orders         Ordered    benztropine (COGENTIN) 2 MG tablet  2 times daily     02/18/19 0926           Tedd Sias, PA 02/18/19 [id number]    Lacretia Leigh, MD 02/19/19 [phone]

## 2019-02-18 NOTE — ED Notes (Signed)
Patient left before she was given discharged paper. Nurse came to the room and found no patient in the room. Patient left without not letting anyone know.

## 2019-02-18 NOTE — ED Triage Notes (Signed)
Pt states she received a shot of haldol on Friday, and has been having right arm pain. Pt states that the shot spread down her arm and has caused her pain.

## 2019-03-02 ENCOUNTER — Encounter (HOSPITAL_COMMUNITY): Payer: Self-pay

## 2019-03-02 ENCOUNTER — Emergency Department (HOSPITAL_COMMUNITY)
Admission: EM | Admit: 2019-03-02 | Discharge: 2019-03-03 | Disposition: A | Discharge status: Home / Self Care | Payer: Medicaid Other | Attending: Emergency Medicine | Admitting: Emergency Medicine

## 2019-03-02 DIAGNOSIS — I10 Essential (primary) hypertension: Secondary | ICD-10-CM | POA: Diagnosis not present

## 2019-03-02 DIAGNOSIS — F17210 Nicotine dependence, cigarettes, uncomplicated: Secondary | ICD-10-CM | POA: Insufficient documentation

## 2019-03-02 DIAGNOSIS — F22 Delusional disorders: Secondary | ICD-10-CM | POA: Diagnosis present

## 2019-03-02 DIAGNOSIS — Z79899 Other long term (current) drug therapy: Secondary | ICD-10-CM | POA: Insufficient documentation

## 2019-03-02 DIAGNOSIS — F250 Schizoaffective disorder, bipolar type: Secondary | ICD-10-CM | POA: Diagnosis not present

## 2019-03-02 LAB — RAPID URINE DRUG SCREEN, HOSP PERFORMED
Amphetamines: NOT DETECTED
Barbiturates: NOT DETECTED
Benzodiazepines: NOT DETECTED
Cocaine: NOT DETECTED
Opiates: NOT DETECTED
Tetrahydrocannabinol: POSITIVE — AB

## 2019-03-02 LAB — COMPREHENSIVE METABOLIC PANEL
ALT: 10 U/L (ref 0–44)
AST: 16 U/L (ref 15–41)
Albumin: 3.8 g/dL (ref 3.5–5.0)
Alkaline Phosphatase: 70 U/L (ref 38–126)
Anion gap: 10 (ref 5–15)
BUN: 7 mg/dL (ref 6–20)
CO2: 22 mmol/L (ref 22–32)
Calcium: 8.7 mg/dL — ABNORMAL LOW (ref 8.9–10.3)
Chloride: 98 mmol/L (ref 98–111)
Creatinine, Ser: 0.93 mg/dL (ref 0.44–1.00)
GFR calc Af Amer: >60 mL/min (ref >60)
GFR calc non Af Amer: >60 mL/min (ref >60)
Glucose, Bld: 103 mg/dL — ABNORMAL HIGH (ref 70–99)
Potassium: 3.4 mmol/L — ABNORMAL LOW (ref 3.5–5.1)
Sodium: 130 mmol/L — ABNORMAL LOW (ref 135–145)
Total Bilirubin: 0.6 mg/dL (ref 0.3–1.2)
Total Protein: 7.9 g/dL (ref 6.5–8.1)

## 2019-03-02 LAB — CBC
HCT: 32.3 % — ABNORMAL LOW (ref 36.0–46.0)
Hemoglobin: 10.2 g/dL — ABNORMAL LOW (ref 12.0–15.0)
MCH: 26.7 pg (ref 26.0–34.0)
MCHC: 31.6 g/dL (ref 30.0–36.0)
MCV: 84.6 fL (ref 80.0–100.0)
Platelets: 309 10*3/uL (ref 150–400)
RBC: 3.82 MIL/uL — ABNORMAL LOW (ref 3.87–5.11)
RDW: 17.4 % — ABNORMAL HIGH (ref 11.5–15.5)
WBC: 10.7 10*3/uL — ABNORMAL HIGH (ref 4.0–10.5)
nRBC: 0 % (ref 0.0–0.2)

## 2019-03-02 LAB — SALICYLATE LEVEL: Salicylate Lvl: <7 mg/dL (ref 2.8–30.0)

## 2019-03-02 LAB — ACETAMINOPHEN LEVEL: Acetaminophen (Tylenol), Serum: <10 ug/mL — ABNORMAL LOW (ref 10–30)

## 2019-03-02 LAB — I-STAT BETA HCG BLOOD, ED (MC, WL, AP ONLY): I-stat hCG, quantitative: <5 m[IU]/mL (ref <5)

## 2019-03-02 LAB — ETHANOL: Alcohol, Ethyl (B): <10 mg/dL (ref <10)

## 2019-03-02 NOTE — ED Provider Notes (Signed)
[facility] EMERGENCY DEPARTMENT Provider Note   CSN: [id number] Arrival date & time: 03/02/19  2113     History   Chief Complaint Chief Complaint  Patient presents with  . Medical Clearance    HPI [name] is a 48 y.o. female with history of bipolar 1 disorder, schizoaffective disorder presents with paranoia that someone is poisoning her and coming into her house and moving things around and stealing things.  She reports she is also been sleeping a lot and not having very much appetite.  Patient recently had an inpatient admission and was given Haldol IM, 30 days.  Patient reports she has not been having any AVH lately.  She denies any SI or HI.  She denies any drug or alcohol use.  Patient reports the police would not take her seriously about the poisoning and someone breaking into her house and they sent her here for evaluation instead.  She acknowledges that her 53 year old son could be the one stealing from her.     HPI  Past Medical History:  Diagnosis Date  . Arthritis   . Bipolar 1 disorder ([place])   . Bronchitis   . Depression   . Hemorrhoid   . Schizoaffective disorder ([place])   . Schizophrenia ([place])    Pt denies and reports it is schizoaffective disorder.   . Transfusion history     Patient Active Problem List   Diagnosis Date Noted  . Schizophrenia ([place]) 02/07/2019  . Schizoaffective disorder ([place]) 11/08/2018  . No diagnosis on Axis I 08/21/2015  . Abnormal behavior   . Involuntary commitment   . Psychosis ([place])   . Disturbance in affect   . Tobacco use disorder 05/12/2015  . Cannabis use disorder, moderate, dependence ([place]) 05/12/2015  . Schizo affective schizophrenia ([place]) 05/10/2015  . Hypertension 02/24/2011  . ANEMIA-IRON DEFICIENCY 12/13/2006  . GERD 12/13/2006  . OBESITY 12/12/2006  . Schizoaffective disorder, bipolar type ([place]) 12/12/2006  . HEMORRHOIDS, NOS 12/12/2006  . PAIN-NECK 12/12/2006  . TREMOR 12/12/2006     Past Surgical History:  Procedure Laterality Date  . CESAREAN SECTION       OB History   No obstetric history on file.      Home Medications    Prior to Admission medications   Medication Sig Start Date End Date Taking? Authorizing Provider  haloperidol decanoate (HALDOL DECANOATE) 100 MG/ML injection Inject 0.75 mLs (75 mg total) into the muscle once for 1 dose. Patient taking differently: Inject 75 mg into the muscle every 28 (twenty-eight) days.  02/15/19 03/02/28 Yes [name], MD  Multiple Vitamin (MULTIVITAMIN WITH MINERALS) TABS tablet Take 1 tablet by mouth daily.   Yes [provider]  benztropine (COGENTIN) 2 MG tablet Take 1 tablet (2 mg total) by mouth 2 (two) times daily for 3 days. Patient not taking: Reported on 03/03/2019 02/18/19 03/02/28  Tedd Sias, PA  carbamazepine (TEGRETOL XR) 200 MG 12 hr tablet Take 1 tablet (200 mg total) by mouth daily. Patient not taking: Reported on 03/03/2019 02/16/19   [name], MD  clindamycin (CLEOCIN) 300 MG capsule Take 1 capsule (300 mg total) by mouth every 8 (eight) hours. Patient not taking: Reported on 03/03/2019 02/15/19   [name], MD  gabapentin (NEURONTIN) 300 MG capsule Take 1 capsule (300 mg total) by mouth 3 (three) times daily. Patient not taking: Reported on 03/03/2019 02/15/19   [name], MD  haloperidol (HALDOL) 5 MG tablet Take 4 tablets (20 mg  total) by mouth at bedtime. Patient not taking: Reported on 03/03/2019 02/15/19   [name], MD  primidone (MYSOLINE) 50 MG tablet Take 1 tablet (50 mg total) by mouth 3 (three) times daily. Patient not taking: Reported on 03/03/2019 02/15/19   [name], MD    Family History Family History  Problem Relation Age of Onset  . Diabetes type II Other     Social History Social History   Tobacco Use  . Smoking status: Current Every Day Smoker    Packs/day: 0.50    Years: 17.00    Pack years: 8.50    Types: Cigarettes  . Smokeless  tobacco: Never Used  Substance Use Topics  . Alcohol use: No  . Drug use: No    Types: Marijuana    Comment: Former user     Allergies   Latuda [lurasidone hcl], Abilify [aripiprazole], Flagyl [metronidazole hcl], Prolixin [fluphenazine], Risperidone and related, Seroquel [quetiapine fumarate], and Sulfa antibiotics   Review of Systems Review of Systems  Constitutional: Positive for appetite change. Negative for chills and fever.  HENT: Negative for facial swelling and sore throat.   Respiratory: Negative for shortness of breath.   Cardiovascular: Negative for chest pain.  Gastrointestinal: Negative for abdominal pain, nausea and vomiting.  Genitourinary: Negative for dysuria.  Musculoskeletal: Negative for back pain.  Skin: Negative for rash and wound.  Neurological: Negative for headaches.  Psychiatric/Behavioral: Positive for sleep disturbance. Negative for hallucinations and suicidal ideas. The patient is not nervous/anxious.      Physical Exam Updated Vital Signs BP 122/78   Pulse 89   Temp 98 F (36.7 C) (Oral)   Resp 20   SpO2 100%   Physical Exam Vitals signs and nursing note reviewed.  Constitutional:      General: She is not in acute distress.    Appearance: She is well-developed. She is not diaphoretic.  HENT:     Head: Normocephalic and atraumatic.     Mouth/Throat:     Pharynx: No oropharyngeal exudate.  Eyes:     General: No scleral icterus.       Right eye: No discharge.        Left eye: No discharge.     Conjunctiva/sclera: Conjunctivae normal.     Pupils: Pupils are equal, round, and reactive to light.  Neck:     Musculoskeletal: Normal range of motion and neck supple.     Thyroid: No thyromegaly.  Cardiovascular:     Rate and Rhythm: Normal rate and regular rhythm.     Heart sounds: Normal heart sounds. No murmur. No friction rub. No gallop.   Pulmonary:     Effort: Pulmonary effort is normal. No respiratory distress.     Breath sounds:  Normal breath sounds. No stridor. No wheezing or rales.  Abdominal:     General: Bowel sounds are normal. There is no distension.     Palpations: Abdomen is soft.     Tenderness: There is no abdominal tenderness. There is no guarding or rebound.  Lymphadenopathy:     Cervical: No cervical adenopathy.  Skin:    General: Skin is warm and dry.     Coloration: Skin is not pale.     Findings: No rash.  Neurological:     Mental Status: She is alert.     Coordination: Coordination normal.  Psychiatric:        Attention and Perception: She does not perceive auditory or visual hallucinations.  Thought Content: Thought content does not include homicidal or suicidal ideation.      ED Treatments / Results  Labs (all labs ordered are listed, but only abnormal results are displayed) Labs Reviewed  COMPREHENSIVE METABOLIC PANEL - Abnormal; Notable for the following components:      Result Value   Sodium 130 (*)    Potassium 3.4 (*)    Glucose, Bld 103 (*)    Calcium 8.7 (*)    All other components within normal limits  ACETAMINOPHEN LEVEL - Abnormal; Notable for the following components:   Acetaminophen (Tylenol), Serum <10 (*)    All other components within normal limits  CBC - Abnormal; Notable for the following components:   WBC 10.7 (*)    RBC 3.82 (*)    Hemoglobin 10.2 (*)    HCT 32.3 (*)    RDW 17.4 (*)    All other components within normal limits  RAPID URINE DRUG SCREEN, HOSP PERFORMED - Abnormal; Notable for the following components:   Tetrahydrocannabinol POSITIVE (*)    All other components within normal limits  ETHANOL  SALICYLATE LEVEL  I-STAT BETA HCG BLOOD, ED ([facility], [facility], [facility] ONLY)    EKG None  Radiology No results found.  Procedures Procedures (including critical care time)  Medications Ordered in ED Medications - No data to display   Initial Impression / Assessment and Plan / ED Course  I have reviewed the triage vital signs and the nursing notes.   Pertinent labs & imaging results that were available during my care of the patient were reviewed by me and considered in my medical decision making (see chart for details).        Patient presenting with concerns about possibly being poisoned and people breaking into her house and stealing things.  Patient's schizoaffective disorder is currently being treated by Haldol Depo injection.  She denies any AVH, SI, HI.  TTS initially ordered as patient was concerned it may be related to her mental illness and the police were taking her seriously because of this.  However, patient reports she would like to leave as her son is home alone.  She states the more she thinks about it, her "son is bad" and is may be capable with thing she is concerned about.  She would like to go home and follow-up with her doctor.  I feel this is reasonable and I do not feel patient is a danger to herself or others at this time.  Labs are stable.  Return precautions discussed.  Patient understands and agrees with plan.  Patient vitals stable throughout ED course and discharged in satisfactory condition.  Final Clinical Impressions(s) / ED Diagnoses   Final diagnoses:  Schizoaffective disorder, bipolar type [facility])    ED Discharge Orders    None       Frederica Kuster, PA-C 03/03/19 [id number]    Gareth Morgan, MD 03/04/19 [id number]

## 2019-03-02 NOTE — ED Triage Notes (Signed)
Pt comes via [facility] for being paranoid, pt thinks that her neighbor has a key to her house and is breaking in and poisoning her. Pt also states that that she slept all summer

## 2019-03-03 NOTE — Discharge Instructions (Addendum)
Please follow up with your doctor as planned. Please return to the emergency department if you develop any new or worsening symptoms.

## 2019-03-03 NOTE — ED Notes (Signed)
Pt waiting for TTS asked to go home says she has a 49 year old son home alone. Provider notified

## 2019-03-03 NOTE — ED Notes (Addendum)
Pt states she needed money to get home. Pt was directed to the atm to withdraw cab money

## 2019-11-27 ENCOUNTER — Ambulatory Visit (HOSPITAL_COMMUNITY)
Admission: EM | Admit: 2019-11-27 | Discharge: 2019-11-28 | Disposition: A | Discharge status: Other Acute Inpatient Hospital | Payer: Medicaid Other | Source: Home / Self Care | Admitting: Psychiatry

## 2019-11-27 ENCOUNTER — Other Ambulatory Visit: Payer: Self-pay

## 2019-11-27 DIAGNOSIS — F259 Schizoaffective disorder, unspecified: Secondary | ICD-10-CM | POA: Diagnosis present

## 2019-11-27 DIAGNOSIS — Z20822 Contact with and (suspected) exposure to covid-19: Secondary | ICD-10-CM | POA: Insufficient documentation

## 2019-11-27 DIAGNOSIS — F329 Major depressive disorder, single episode, unspecified: Secondary | ICD-10-CM | POA: Insufficient documentation

## 2019-11-27 DIAGNOSIS — F250 Schizoaffective disorder, bipolar type: Secondary | ICD-10-CM | POA: Insufficient documentation

## 2019-11-27 DIAGNOSIS — Z888 Allergy status to other drugs, medicaments and biological substances status: Secondary | ICD-10-CM | POA: Insufficient documentation

## 2019-11-27 DIAGNOSIS — T421X6A Underdosing of iminostilbenes, initial encounter: Secondary | ICD-10-CM | POA: Insufficient documentation

## 2019-11-27 DIAGNOSIS — Z91128 Patient's intentional underdosing of medication regimen for other reason: Secondary | ICD-10-CM | POA: Insufficient documentation

## 2019-11-27 DIAGNOSIS — T43596A Underdosing of other antipsychotics and neuroleptics, initial encounter: Secondary | ICD-10-CM | POA: Insufficient documentation

## 2019-11-27 DIAGNOSIS — Z882 Allergy status to sulfonamides status: Secondary | ICD-10-CM | POA: Insufficient documentation

## 2019-11-27 DIAGNOSIS — M1990 Unspecified osteoarthritis, unspecified site: Secondary | ICD-10-CM | POA: Insufficient documentation

## 2019-11-27 DIAGNOSIS — T443X6A Underdosing of other parasympatholytics [anticholinergics and antimuscarinics] and spasmolytics, initial encounter: Secondary | ICD-10-CM | POA: Insufficient documentation

## 2019-11-27 DIAGNOSIS — Z881 Allergy status to other antibiotic agents status: Secondary | ICD-10-CM | POA: Insufficient documentation

## 2019-11-27 DIAGNOSIS — G2401 Drug induced subacute dyskinesia: Secondary | ICD-10-CM | POA: Insufficient documentation

## 2019-11-27 DIAGNOSIS — F17210 Nicotine dependence, cigarettes, uncomplicated: Secondary | ICD-10-CM | POA: Insufficient documentation

## 2019-11-27 DIAGNOSIS — T43216A Underdosing of selective serotonin and norepinephrine reuptake inhibitors, initial encounter: Secondary | ICD-10-CM | POA: Insufficient documentation

## 2019-11-27 DIAGNOSIS — Z79899 Other long term (current) drug therapy: Secondary | ICD-10-CM | POA: Insufficient documentation

## 2019-11-27 LAB — POC SARS CORONAVIRUS 2 AG: SARS Coronavirus 2 Ag: NEGATIVE

## 2019-11-27 MED ORDER — ALUM & MAG HYDROXIDE-SIMETH 200-200-20 MG/5ML PO SUSP: 30.0000 mL | ORAL | Status: DC | PRN

## 2019-11-27 MED ORDER — HYDROXYZINE HCL 25 MG PO TABS
50.0000 mg | ORAL_TABLET | Freq: Once | ORAL | Status: AC
  Administered 2019-11-28: 50 mg via ORAL
  Filled 2019-11-27: qty 2

## 2019-11-27 MED ORDER — MAGNESIUM HYDROXIDE 400 MG/5ML PO SUSP: 30.0000 mL | Freq: Every day | ORAL | Status: DC | PRN

## 2019-11-27 NOTE — ED Provider Notes (Signed)
  [name] is a 49 y.o. female.  She was brought to [facility] by law enforcement voluntarily due to her experiencing visual and auditory hallucinations. She expressed that she sees small animals in the lines of the wooden door. She also discussed hearing voices that are giving her commands to hurt herself and to hurt people. She states, "I know it is wrong to do either of those things. I am not going to do it, but it is bothering me bad." The patient presents with tardive dyskinesia (TD) symptoms, which she voiced that she paid a visit to her PCP, and they diagnosed her with TD and a UTI. She discussed that her doctor did not prescribe her any antibiotics for the UTI. She voiced her provider only order a diuretic because her blood pressure was high. The patient disclosed she is a patient of Dr. [name]. The patient revealed that she received 15 mg of Haldol injection on Friday, 8.06.21. The patient informed this Probation officer she has an ACT team with [employer] but did not want them to be called. She voiced, "it is because of them I am shaking like this. They give me too many medications."  When asked, the patient when did her shaking start before the Haldol injection? She voiced yes. In other conversations, her shaking started after the Haldol injection was given. The patient admits to having some medical problems by stating, "on top of my stomach feel raw." She went on to say, "it is like you eat a whole hamburger, and it is sitting right there. You know what I mean, right?" The patient disclosed she has not been doing well at home. She voiced that she lies down all day and does not want to do anything. She states she has no appetite. She says she has food in her refrigerator and does not want to cook. The patient is requesting to be prescribed Ingrezza. The patient voiced that she does not take much medication. She states she has been prescribed a lot of medicines. Therefore, she has not been taking them. The patient  has been prescribed benztropine (COGENTIN) 1 MG tablet, carbamazepine (TEGRETOL-XR) 200 MG, risperiDONE (RISPERDAL) 3 MG tablet, and traZODone (DESYREL) 150 MG tablet by Dr. [name].

## 2019-11-27 NOTE — ED Triage Notes (Signed)
Pt reports calling police d/t hearing voices and feeling sad over loss of child 30 years ago.

## 2019-11-27 NOTE — ED Provider Notes (Signed)
Behavioral Health Admission [facility]&P [facility] & OBS)  Date: 11/27/19 Patient Name: [name] MRN: [id number] Chief Complaint:  Chief Complaint  Patient presents with  . Hallucinations   Chief Complaint/Presenting Problem: "hearing voices"  Diagnoses: Schizoaffective disorder ([place]) Schizoaffective schizophrenia ([place]) Schizoaffective disorder, bipolar type ([place]) Tobacco use disorder Disturbance in affect Final diagnoses:  None    MEQ:ASTMHD [name] is a 49 y.o. female.  She was brought to [facility] by law enforcement voluntarily due to her experiencing visual and auditory hallucinations. She expressed that she sees small animals in the lines of the wooden door. She also discussed hearing voices that are giving her commands to hurt herself and to hurt people. She states, "I know it is wrong to do either of those things. I am not going to do it, but it is bothering me bad." The patient presents with tardive dyskinesia (TD) symptoms, which she voiced that she paid a visit to her PCP, and they diagnosed her with TD and a UTI. She discussed that her doctor did not prescribe her any antibiotics for the UTI. She voiced her provider only order a diuretic because her blood pressure was high. The patient disclosed she is a patient of [name]. The patient revealed that she received 15 mg of Haldol injection on Friday, 8.06.21. The patient informed this Probation officer she has an ACT team with [employer] but did not want them to be called. She voiced, "it is because of them I am shaking like this. They give me too many medications."  When asked, the patient when did her shaking start before the Haldol injection? She voiced yes. In other conversations, her shaking started after the Haldol injection was given. The patient admits to having some medical problems by stating, "on top of my stomach feel raw." She went on to say, "it is like you eat a whole hamburger, and it is sitting right there. You know what I mean,  right?" The patient disclosed she has not been doing well at home. She voiced that she lies down all day and does not want to do anything. She states she has no appetite. She says she has food in her refrigerator and does not want to cook. The patient is requesting to be prescribed Ingrezza. The patient voiced that she does not take much medication. She states she has been prescribed a lot of medicines. Therefore, she has not been taking them. The patient has been prescribed benztropine (COGENTIN) 1 MG tablet, carbamazepine (TEGRETOL-XR) 200 MG, risperiDONE (RISPERDAL) 3 MG tablet, and traZODone (DESYREL) 150 MG tablet by [name]  PHQ 2-9:     ED from 11/27/2019 in [facility] Admission (Discharged) from 02/06/2019 in [place] [facility] Admission (Discharged) from OP Visit from 11/08/2018 in [place] [facility]  C-SSRS RISK CATEGORY Low Risk Error: Question 6 not populated No Risk       Total Time spent with patient: 30 minutes  Musculoskeletal  Strength & Muscle Tone: within normal limits Gait & Station: normal Patient leans: N/A  Psychiatric Specialty Exam  Presentation General Appearance: Appropriate for Environment;Neat  Eye Contact:Good  Speech:Clear and Coherent  Speech Volume:Normal  Handedness:Right   Mood and Affect  Mood:Depressed;Anxious;Hopeless  Affect:Blunt;Flat   Thought Process  Thought Processes:Coherent;Disorganized  Descriptions of Associations:Intact  Orientation:Full (Time, Place and Person)  Thought Content:Paranoid Ideation  Hallucinations:Hallucinations: Auditory;Command;Visual Description of Command Hallucinations: The voiced is telling her to do things and she see's small  animals in between the cracks of the door. Description of Auditory Hallucinations: The voice (1) telling her to harm herself and others. Description of Visual Hallucinations: She sees small  animals in the line on the door  Ideas of Reference:Delusions;Paranoia  Suicidal Thoughts:Suicidal Thoughts: No  Homicidal Thoughts:Homicidal Thoughts: No   Sensorium  Memory:Immediate Good;Recent Good;Remote Good  Judgment:Fair  Insight:Fair   Executive Functions  Concentration:Good  Attention Span:Good  [place] of Knowledge:Good  Language:Good   Psychomotor Activity  Psychomotor Activity:Psychomotor Activity: Extrapyramidal Side Effects (EPS);Restlessness;Tremor Extrapyramidal Side Effects (EPS): Tardive Dyskinesia AIMS Completed?: No   Assets  Assets:Desire for Improvement;Physical Health;Social Support   Sleep  Sleep:Sleep: Poor   Physical Exam ROS  Blood pressure (!) 143/82, pulse 81, temperature 98.3 F (36.8 C), temperature source Oral, resp. rate 18, SpO2 100 %. There is no height or weight on file to calculate BMI.  Past Psychiatric History: Bipolar 1 disorder ([place]) Depression Schizoaffective disorder ([place]) Schizophrenia ([place]) Pt denies and reports it is schizoaffective disorder.   Is the patient at risk to self? Yes  Has the patient been a risk to self in the past 6 months? Yes .    Has the patient been a risk to self within the distant past? Yes   Is the patient a risk to others? No   Has the patient been a risk to others in the past 6 months? No   Has the patient been a risk to others within the distant past? No   Past Medical History:  Past Medical History:  Diagnosis Date  . Arthritis   . Bipolar 1 disorder ([place])   . Bronchitis   . Depression   . Hemorrhoid   . Schizoaffective disorder ([place])   . Schizophrenia ([place])    Pt denies and reports it is schizoaffective disorder.   . Transfusion history     Past Surgical History:  Procedure Laterality Date  . CESAREAN SECTION      Family History:  Family History  Problem Relation Age of Onset  . Diabetes type II Other     Social History:  Social History    Socioeconomic History  . Marital status: Legally Separated    Spouse name: Not on file  . Number of children: Not on file  . Years of education: Not on file  . Highest education level: Not on file  Occupational History  . Not on file  Tobacco Use  . Smoking status: Current Every Day Smoker    Packs/day: 0.50    Years: 17.00    Pack years: 8.50    Types: Cigarettes  . Smokeless tobacco: Never Used  Vaping Use  . Vaping Use: Never used  Substance and Sexual Activity  . Alcohol use: No  . Drug use: No    Types: Marijuana    Comment: Former user  . Sexual activity: Never  Other Topics Concern  . Not on file  Social History Narrative   ** Merged History Encounter **       Social Determinants of Health   Financial Resource Strain:   . Difficulty of Paying Living Expenses:   Food Insecurity:   . Worried About Charity fundraiser in the Last Year:   . Arboriculturist in the Last Year:   Transportation Needs:   . Film/video editor (Medical):   Marland Kitchen Lack of Transportation (Non-Medical):   Physical Activity:   . Days of Exercise per Week:   . Minutes of  Exercise per Session:   Stress:   . Feeling of Stress :   Social Connections:   . Frequency of Communication with Friends and Family:   . Frequency of Social Gatherings with Friends and Family:   . Attends Religious Services:   . Active Member of Clubs or Organizations:   . Attends Archivist Meetings:   Marland Kitchen Marital Status:   Intimate Partner Violence:   . Fear of Current or Ex-Partner:   . Emotionally Abused:   Marland Kitchen Physically Abused:   . Sexually Abused:     SDOH:  SDOH Screenings   Alcohol Screen: Low Risk   . Last Alcohol Screening Score (AUDIT): 0  Depression (PHQ2-9):   . PHQ-2 Score:   Financial Resource Strain:   . Difficulty of Paying Living Expenses:   Food Insecurity:   . Worried About Charity fundraiser in the Last Year:   . [place] in the Last Year:   Housing:   . Last  Housing Risk Score:   Physical Activity:   . Days of Exercise per Week:   . Minutes of Exercise per Session:   Social Connections:   . Frequency of Communication with Friends and Family:   . Frequency of Social Gatherings with Friends and Family:   . Attends Religious Services:   . Active Member of Clubs or Organizations:   . Attends Archivist Meetings:   Marland Kitchen Marital Status:   Stress:   . Feeling of Stress :   Tobacco Use: High Risk  . Smoking Tobacco Use: Current Every Day Smoker  . Smokeless Tobacco Use: Never Used  Transportation Needs:   . Film/video editor (Medical):   Marland Kitchen Lack of Transportation (Non-Medical):     Last Labs:  Admission on 11/27/2019  Component Date Value Ref Range Status  . SARS Coronavirus 2 Ag 11/27/2019 NEGATIVE  NEGATIVE Final   Comment: (NOTE) SARS-CoV-2 antigen NOT DETECTED.   Negative results are presumptive.  Negative results do not preclude SARS-CoV-2 infection and should not be used as the sole basis for treatment or other patient management decisions, including infection  control decisions, particularly in the presence of clinical signs and  symptoms consistent with COVID-19, or in those who have been in contact with the virus.  Negative results must be combined with clinical observations, patient history, and epidemiological information. The expected result is Negative.  Fact Sheet for Patients: PodPark.tn  Fact Sheet for Healthcare Providers: GiftContent.is   This test is not yet approved or cleared by the [place] FDA and  has been authorized for detection and/or diagnosis of SARS-CoV-2 by FDA under an Emergency Use Authorization (EUA).  This EUA will remain in effect (meaning this test can be used) for the duration of  the C                          OVID-19 declaration under Section 564(b)(1) of the Act, 21 U.S.C. section 360bbb-3(b)(1), unless the  authorization is terminated or revoked sooner.      Allergies: Latuda [lurasidone hcl], Abilify [aripiprazole], Flagyl [metronidazole hcl], Prolixin [fluphenazine], Risperidone and related, Seroquel [quetiapine fumarate], and Sulfa antibiotics  PTA Medications: (Not in a hospital admission)   Medical Decision Making  The patient is a safety risk to herself and currently is requiring psychiatric inpatient admission for stabilization and treatment.  Recommendations  Based on my evaluation the patient appears to have an emergency medical condition  for which I recommend the patient be transferred to the emergency department for further evaluation.  Caroline Sauger, NP 11/27/19  11:43 PM

## 2019-11-27 NOTE — ED Notes (Signed)
Pt A&O, calm & cooperative at this time. Reports AVH d/t stressors causing her to focus on loss of 2 mo old daughter 30 years ago. Stressors r/t new living situation & issues with neighbor. Plan to tfr to [facility] explained to pt, labs drawn.

## 2019-11-27 NOTE — BH Assessment (Signed)
Comprehensive Clinical Assessment (CCA) Screening, Triage and Referral Note  11/27/2019 [name] [id number]   Patient presenting as a walk-in to [facility] due to "hearing voices". History of Schizoaffective disorder. Patient reported she is hearing voices to harm herself and others. Patient reported the voices will not stop and that she does not want to harm herself or other people. Patient reported onset "a few days ago". Patient stated "when I get sick the devil talks to me and portrays himself as Obama". Patient reports not sleeping in past 3 months and not eating, stating "the top of my stomach feels raw so I do not eat". Patient reported worsening depressive symptoms, isolating, helplessness, worthlessness, anxiety, guilt, lack of interest, unable to start day, "I stay in the bed all day". Patient reported attempted suicide at 49 years old with tylenol overdose. Patient denied self-harming behaviors and substance abuse.   Patient is currently being seen by ACT Team/Dr [name] for medication management. Patient reported receiving her 15 mg haldol shot last Friday. 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 Patient also reported that her PCP office forgot to call her prescriptions in for UTI and needing her prescription medications. Patient reports living alone. Patient denied access to guns. Patient denied legal issues.   Disposition Ysidro Evert, NP, patient meets inpatient criteria. Larose Kells, currently in review for [facility].   Visit Diagnosis: Schizoaffective disorder  Patient Reported Information How did you hear about [place]? Self   Referral name: No data recorded  Referral phone number: No data recorded Whom do you see for routine medical problems? Primary Care   Practice/Facility Name: Dr. Ouida Sills at [facility] and Primary Care   Practice/Facility Phone Number: No data recorded  Name of Contact: No data recorded  Contact Number: No data recorded  Contact Fax  Number: No data recorded  Prescriber Name: No data recorded  Prescriber Address (if known): No data recorded What Is the Reason for Your Visit/Call Today? "hearing voices  How Long Has This Been Causing You Problems? <Week  Have You Recently Been in Any Inpatient Treatment (Hospital/Detox/Crisis Center/28-Day Program)? No   Name/Location of Program/Hospital:No data recorded  How Long Were You There? No data recorded  When Were You Discharged? No data recorded Have You Ever Received Services From [facility] Before? Yes   Who Do You See at [facility]? [facility]  Have You Recently Had Any Thoughts About Hurting Yourself? No   Are You Planning to Commit Suicide/Harm Yourself At This time?  No  Have you Recently Had Thoughts About [place]? No   Explanation: No data recorded Have You Used Any Alcohol or Drugs in the Past 24 Hours? No data recorded  How Long Ago Did You Use Drugs or Alcohol?  No data recorded  What Did You Use and How Much? No data recorded What Do You Feel Would Help You the Most Today? Medication  Do You Currently Have a Therapist/Psychiatrist? Yes   Name of Therapist/Psychiatrist: Dr. [name]   Have You Been Recently Discharged From Any Office Practice or Programs? No   Explanation of Discharge From Practice/Program:  No data recorded    CCA Screening Triage Referral Assessment Type of Contact: Face-to-Face   Is this Initial or Reassessment? No data recorded  Date Telepsych consult ordered in [facility]:  No data recorded  Time Telepsych consult ordered in CHL:  No data recorded Patient Reported Information Reviewed? Yes   Patient Left Without Being Seen? No data recorded  Reason for Not Completing Assessment: No data recorded Collateral Involvement: none reported  Does Patient Have a [place]? No data recorded  Name and Contact of Legal Guardian:  No data recorded If Minor and Not Living with Parent(s), Who has Custody? No  data recorded Is CPS involved or ever been involved? Never  Is APS involved or ever been involved? Never  Patient Determined To Be At Risk for Harm To Self or Others Based on Review of Patient Reported Information or Presenting Complaint? Yes, for Self-Harm   Method: No data recorded  Availability of Means: No data recorded  Intent: No data recorded  Notification Required: No data recorded  Additional Information for Danger to Others Potential:  No data recorded  Additional Comments for Danger to Others Potential:  No data recorded  Are There Guns or Other Weapons in Your Home?  No    Types of Guns/Weapons: No data recorded   Are These Weapons Safely Secured?                              No data recorded   Who Could Verify You Are Able To Have These Secured:    No data recorded Do You Have any Outstanding Charges, Pending Court Dates, Parole/Probation? No data recorded Contacted To Inform of Risk of Harm To Self or Others: No data recorded Location of Assessment: [facility] Assessment Services  Does Patient Present under Involuntary Commitment? No   IVC Papers Initial File Date: No data recorded  [place] of Residence: [place]  Patient Currently Receiving the Following Services: ACTT Architect)   Determination of Need: Emergent (2 hours)   Options For Referral: Inpatient Hospitalization   Venora Maples, [facility]         Comprehensive Clinical Assessment (CCA) Note  11/27/2019 [name] [id number]  Visit Diagnosis:   No diagnosis found.    CCA Screening, Triage and Referral (STR)  Patient Reported Information How did you hear about [place]? Self  Referral name: No data recorded Referral phone number: No data recorded  Whom do you see for routine medical problems? Primary Care  Practice/Facility Name: Dr. Ouida Sills at [facility] and Primary Care  Practice/Facility Phone Number: No data recorded Name of Contact: No data  recorded Contact Number: No data recorded Contact Fax Number: No data recorded Prescriber Name: No data recorded Prescriber Address (if known): No data recorded  What Is the Reason for Your Visit/Call Today? "hearing voices  How Long Has This Been Causing You Problems? <Week  What Do You Feel Would Help You the Most Today? Medication   Have You Recently Been in Any Inpatient Treatment (Hospital/Detox/Crisis Center/28-Day Program)? No  Name/Location of Program/Hospital:No data recorded How Long Were You There? No data recorded When Were You Discharged? No data recorded  Have You Ever Received Services From [facility] Before? Yes  Who Do You See at [facility]   Have You Recently Had Any Thoughts About Hurting Yourself? No  Are You Planning to Commit Suicide/Harm Yourself At This time? No   Have you Recently Had Thoughts About [place]? No  Explanation: No data recorded  Have You Used Any Alcohol or Drugs in the Past 24 Hours? No data recorded How Long Ago Did You Use Drugs or Alcohol? No data recorded What Did You Use and How Much? No data recorded  Do You Currently Have a Therapist/Psychiatrist?  Yes  Name of Therapist/Psychiatrist: Dr. [name]   Have You Been Recently Discharged From Any Office Practice or Programs? No  Explanation of Discharge From Practice/Program: No data recorded    CCA Screening Triage Referral Assessment Type of Contact: Face-to-Face  Is this Initial or Reassessment? No data recorded Date Telepsych consult ordered in CHL:  No data recorded Time Telepsych consult ordered in CHL:  No data recorded  Patient Reported Information Reviewed? Yes  Patient Left Without Being Seen? No data recorded Reason for Not Completing Assessment: No data recorded  Collateral Involvement: none reported   Does Patient Have a [place]? No data recorded Name and Contact of Legal Guardian: No data recorded If  Minor and Not Living with Parent(s), Who has Custody? No data recorded Is CPS involved or ever been involved? Never  Is APS involved or ever been involved? Never   Patient Determined To Be At Risk for Harm To Self or Others Based on Review of Patient Reported Information or Presenting Complaint? Yes, for Self-Harm  Method: No data recorded Availability of Means: No data recorded Intent: No data recorded Notification Required: No data recorded Additional Information for Danger to Others Potential: No data recorded Additional Comments for Danger to Others Potential: No data recorded Are There Guns or Other Weapons in Your Home? No  Types of Guns/Weapons: No data recorded Are These Weapons Safely Secured?                            No data recorded Who Could Verify You Are Able To Have These Secured: No data recorded Do You Have any Outstanding Charges, Pending Court Dates, Parole/Probation? No data recorded Contacted To Inform of Risk of Harm To Self or Others: No data recorded  Location of Assessment: [facility] Assessment Services   Does Patient Present under Involuntary Commitment? No  IVC Papers Initial File Date: No data recorded  [place] of Residence: [place]   Patient Currently Receiving the Following Services: ACTT Architect)   Determination of Need: Emergent (2 hours)   Options For Referral: Inpatient Hospitalization     CCA Biopsychosocial  Intake/Chief Complaint:  CCA Intake With Chief Complaint Chief Complaint/Presenting Problem: "hearing voices" Individual's Strengths: self awareness of needs regarding current mental status Individual's Preferences: inpatient treatment and help with medical concerns Type of Services Patient Feels Are Needed: medication adjustment for medical condition  Mental Health Symptoms Depression:  Depression: Change in energy/activity, Worthlessness, Duration of symptoms greater than two weeks, Fatigue, Difficulty  Concentrating, Hopelessness, Tearfulness, Sleep (too much or little)  Mania:  Mania: N/A  Anxiety:   Anxiety: Difficulty concentrating, Fatigue, Restlessness, Worrying  Psychosis:  Psychosis: Hallucinations  Trauma:  Trauma: N/A  Obsessions:  Obsessions: N/A  Compulsions:  Compulsions: N/A  Inattention:  Inattention: N/A  Hyperactivity/Impulsivity:  Hyperactivity/Impulsivity: N/A  Oppositional/Defiant Behaviors:  Oppositional/Defiant Behaviors: N/A  Emotional Irregularity:  Emotional Irregularity: N/A  Other Mood/Personality Symptoms:      Mental Status Exam Appearance and self-care  Stature:  Stature: Average  Weight:  Weight: Average weight  Clothing:  Clothing: Neat/clean  Grooming:  Grooming: Well-groomed  Cosmetic use:  Cosmetic Use: Age appropriate  Posture/gait:  Posture/Gait: Normal  Motor activity:  Motor Activity: Restless  Sensorium  Attention:  Attention: Normal  Concentration:  Concentration: Anxiety interferes  Orientation:  Orientation: Person, Place, Situation, Time  Recall/memory:     Affect and Mood  Affect:  Affect: Anxious, Depressed  Mood:  Mood: Anxious, Depressed, Hopeless  Relating  Eye contact:  Eye Contact: Normal  Facial expression:  Facial Expression: Sad, Anxious  Attitude toward examiner:  Attitude Toward Examiner: Cooperative  Thought and Language  Speech flow: Speech Flow: Normal  Thought content:  Thought Content: Appropriate to Mood and Circumstances  Preoccupation:     Hallucinations:  Hallucinations: Auditory (command voices to self and others)  Organization:     Transport planner of Knowledge:  Fund of Knowledge: Average  Intelligence:  Intelligence: Average  Abstraction:  Abstraction: Normal  Judgement:  Judgement: Fair  Art therapist:     Insight:  Insight: Fair  Decision Making:  Decision Making: Normal  Social Functioning  Social Maturity:     Social Judgement:     Stress  Stressors:  Stressors: Housing,  Transitions  Coping Ability:  Coping Ability: English as a second language teacher Deficits:  Skill Deficits: None  Supports:        Religion:    Leisure/Recreation: Leisure / Recreation Do You Have Hobbies?: Yes Leisure and Hobbies: "cook and do things, I dont do anything anymore"  Exercise/Diet: Exercise/Diet Do You Have Any Trouble Sleeping?: Yes Explanation of Sleeping Difficulties: "I don't sleep"   CCA Employment/Education  Employment/Work Situation: Employment / Work Situation Employment situation: On disability Why is patient on disability: mental illness Patient's job has been impacted by current illness: No Has patient ever been in the [employer]?: No  Education: Education Is Patient Currently Attending School?: No   CCA Family/Childhood History  Family and Relationship History: Family history Marital status: Single Does patient have children?: Yes How many children?: 3 How is patient's relationship with their children?: good  Childhood History:     Child/Adolescent Assessment:     CCA Substance Use  Alcohol/Drug Use: Alcohol / Drug Use Pain Medications: see MAR Prescriptions: see MAR Over the Counter: see MAR History of alcohol / drug use?: No history of alcohol / drug abuse                         ASAM's:  Six Dimensions of Multidimensional Assessment  Dimension 1:  Acute Intoxication and/or Withdrawal Potential:      Dimension 2:  Biomedical Conditions and Complications:      Dimension 3:  Emotional, Behavioral, or Cognitive Conditions and Complications:     Dimension 4:  Readiness to Change:     Dimension 5:  Relapse, Continued use, or Continued Problem Potential:     Dimension 6:  Recovery/Living Environment:     ASAM Severity Score:    ASAM Recommended Level of Treatment:     Substance use Disorder (SUD)    Recommendations for Services/Supports/Treatments:    DSM5 Diagnoses: Patient Active Problem List   Diagnosis Date Noted  .  Schizophrenia ([place]) 02/07/2019  . Schizoaffective disorder ([place]) 11/08/2018  . No diagnosis on Axis I 08/21/2015  . Abnormal behavior   . Involuntary commitment   . Psychosis ([place])   . Disturbance in affect   . Tobacco use disorder 05/12/2015  . Cannabis use disorder, moderate, dependence ([place]) 05/12/2015  . Schizo affective schizophrenia ([place]) 05/10/2015  . Hypertension 02/24/2011  . ANEMIA-IRON DEFICIENCY 12/13/2006  . GERD 12/13/2006  . OBESITY 12/12/2006  . Schizoaffective disorder, bipolar type ([place]) 12/12/2006  . HEMORRHOIDS, NOS 12/12/2006  . PAIN-NECK 12/12/2006  . TREMOR 12/12/2006    Patient Centered Plan: Patient is on the following Treatment Plan(s):    Referrals to Alternative Service(s):  Referred to Alternative Service(s):   Place:   Date:   Time:    Referred to Alternative Service(s):   Place:   Date:   Time:    Referred to Alternative Service(s):   Place:   Date:   Time:    Referred to Alternative Service(s):   Place:   Date:   Time:     Venora Maples

## 2019-11-27 NOTE — ED Notes (Signed)
Purse in locker #7

## 2019-11-28 ENCOUNTER — Encounter (HOSPITAL_COMMUNITY): Payer: Self-pay | Admitting: Psychiatry

## 2019-11-28 ENCOUNTER — Other Ambulatory Visit: Payer: Self-pay

## 2019-11-28 ENCOUNTER — Inpatient Hospital Stay (HOSPITAL_COMMUNITY)
Admission: AD | Admit: 2019-11-28 | Discharge: 2019-11-30 | DRG: 885 | Disposition: A | Discharge status: Home / Self Care | Payer: Medicaid Other | Attending: Psychiatry | Admitting: Psychiatry

## 2019-11-28 DIAGNOSIS — F209 Schizophrenia, unspecified: Secondary | ICD-10-CM | POA: Diagnosis present

## 2019-11-28 DIAGNOSIS — F250 Schizoaffective disorder, bipolar type: Principal | ICD-10-CM | POA: Diagnosis present

## 2019-11-28 DIAGNOSIS — F259 Schizoaffective disorder, unspecified: Secondary | ICD-10-CM | POA: Diagnosis present

## 2019-11-28 DIAGNOSIS — I10 Essential (primary) hypertension: Secondary | ICD-10-CM | POA: Diagnosis not present

## 2019-11-28 DIAGNOSIS — G4700 Insomnia, unspecified: Secondary | ICD-10-CM | POA: Diagnosis present

## 2019-11-28 DIAGNOSIS — F1220 Cannabis dependence, uncomplicated: Secondary | ICD-10-CM | POA: Diagnosis present

## 2019-11-28 DIAGNOSIS — Z20822 Contact with and (suspected) exposure to covid-19: Secondary | ICD-10-CM | POA: Diagnosis present

## 2019-11-28 DIAGNOSIS — F419 Anxiety disorder, unspecified: Secondary | ICD-10-CM | POA: Diagnosis present

## 2019-11-28 DIAGNOSIS — R4689 Other symptoms and signs involving appearance and behavior: Secondary | ICD-10-CM | POA: Diagnosis present

## 2019-11-28 DIAGNOSIS — F17210 Nicotine dependence, cigarettes, uncomplicated: Secondary | ICD-10-CM | POA: Diagnosis present

## 2019-11-28 DIAGNOSIS — F17200 Nicotine dependence, unspecified, uncomplicated: Secondary | ICD-10-CM | POA: Diagnosis present

## 2019-11-28 DIAGNOSIS — Z79899 Other long term (current) drug therapy: Secondary | ICD-10-CM | POA: Diagnosis not present

## 2019-11-28 DIAGNOSIS — F29 Unspecified psychosis not due to a substance or known physiological condition: Secondary | ICD-10-CM | POA: Diagnosis present

## 2019-11-28 DIAGNOSIS — R259 Unspecified abnormal involuntary movements: Secondary | ICD-10-CM | POA: Diagnosis not present

## 2019-11-28 DIAGNOSIS — Z833 Family history of diabetes mellitus: Secondary | ICD-10-CM

## 2019-11-28 DIAGNOSIS — R4589 Other symptoms and signs involving emotional state: Secondary | ICD-10-CM | POA: Diagnosis present

## 2019-11-28 LAB — COMPREHENSIVE METABOLIC PANEL
ALT: 11 U/L (ref 0–44)
AST: 15 U/L (ref 15–41)
Albumin: 4.1 g/dL (ref 3.5–5.0)
Alkaline Phosphatase: 74 U/L (ref 38–126)
Anion gap: 12 (ref 5–15)
BUN: 7 mg/dL (ref 6–20)
CO2: 21 mmol/L — ABNORMAL LOW (ref 22–32)
Calcium: 9.4 mg/dL (ref 8.9–10.3)
Chloride: 102 mmol/L (ref 98–111)
Creatinine, Ser: 0.81 mg/dL (ref 0.44–1.00)
GFR calc Af Amer: >60 mL/min (ref >60)
GFR calc non Af Amer: >60 mL/min (ref >60)
Glucose, Bld: 88 mg/dL (ref 70–99)
Potassium: 3.4 mmol/L — ABNORMAL LOW (ref 3.5–5.1)
Sodium: 135 mmol/L (ref 135–145)
Total Bilirubin: 0.6 mg/dL (ref 0.3–1.2)
Total Protein: 8 g/dL (ref 6.5–8.1)

## 2019-11-28 LAB — CBC WITH DIFFERENTIAL/PLATELET
Abs Immature Granulocytes: 0.04 10*3/uL (ref 0.00–0.07)
Basophils Absolute: 0.1 10*3/uL (ref 0.0–0.1)
Basophils Relative: 1 %
Eosinophils Absolute: 0.1 10*3/uL (ref 0.0–0.5)
Eosinophils Relative: 1 %
HCT: 29 % — ABNORMAL LOW (ref 36.0–46.0)
Hemoglobin: 9 g/dL — ABNORMAL LOW (ref 12.0–15.0)
Immature Granulocytes: 0 %
Lymphocytes Relative: 37 %
Lymphs Abs: 4.2 10*3/uL — ABNORMAL HIGH (ref 0.7–4.0)
MCH: 24.4 pg — ABNORMAL LOW (ref 26.0–34.0)
MCHC: 31 g/dL (ref 30.0–36.0)
MCV: 78.6 fL — ABNORMAL LOW (ref 80.0–100.0)
Monocytes Absolute: 0.6 10*3/uL (ref 0.1–1.0)
Monocytes Relative: 5 %
Neutro Abs: 6.3 10*3/uL (ref 1.7–7.7)
Neutrophils Relative %: 56 %
Platelets: 362 10*3/uL (ref 150–400)
RBC: 3.69 MIL/uL — ABNORMAL LOW (ref 3.87–5.11)
RDW: 18.1 % — ABNORMAL HIGH (ref 11.5–15.5)
WBC: 11.3 10*3/uL — ABNORMAL HIGH (ref 4.0–10.5)
nRBC: 0 % (ref 0.0–0.2)

## 2019-11-28 LAB — RAPID URINE DRUG SCREEN, HOSP PERFORMED
Amphetamines: NOT DETECTED
Barbiturates: NOT DETECTED
Benzodiazepines: NOT DETECTED
Cocaine: NOT DETECTED
Opiates: NOT DETECTED
Tetrahydrocannabinol: POSITIVE — AB

## 2019-11-28 LAB — URINALYSIS, ROUTINE W REFLEX MICROSCOPIC
Bilirubin Urine: NEGATIVE
Glucose, UA: NEGATIVE mg/dL
Ketones, ur: 5 mg/dL — AB
Nitrite: NEGATIVE
Protein, ur: NEGATIVE mg/dL
RBC / HPF: >50 RBC/hpf — ABNORMAL HIGH (ref 0–5)
Specific Gravity, Urine: 1.015 (ref 1.005–1.030)
pH: 5 (ref 5.0–8.0)

## 2019-11-28 LAB — SARS CORONAVIRUS 2 BY RT PCR (HOSPITAL ORDER, PERFORMED IN ~~LOC~~ HOSPITAL LAB): SARS Coronavirus 2: NEGATIVE

## 2019-11-28 LAB — LIPID PANEL
Cholesterol: 193 mg/dL (ref 0–200)
HDL: 54 mg/dL (ref >40)
LDL Cholesterol: 124 mg/dL — ABNORMAL HIGH (ref 0–99)
Total CHOL/HDL Ratio: 3.6 RATIO
Triglycerides: 75 mg/dL (ref <150)
VLDL: 15 mg/dL (ref 0–40)

## 2019-11-28 LAB — ETHANOL: Alcohol, Ethyl (B): <10 mg/dL (ref <10)

## 2019-11-28 LAB — TSH: TSH: 3.181 u[IU]/mL (ref 0.350–4.500)

## 2019-11-28 MED ORDER — PRIMIDONE 50 MG PO TABS
50.0000 mg | ORAL_TABLET | Freq: Three times a day (TID) | ORAL | Status: DC
  Administered 2019-11-28 – 2019-11-30 (×6): 50 mg via ORAL
  Filled 2019-11-28 (×10): qty 1

## 2019-11-28 MED ORDER — TRAZODONE HCL 50 MG PO TABS
50.0000 mg | ORAL_TABLET | Freq: Every evening | ORAL | Status: DC | PRN
  Administered 2019-11-28 – 2019-11-29 (×2): 50 mg via ORAL
  Filled 2019-11-28 (×2): qty 1

## 2019-11-28 MED ORDER — MAGNESIUM HYDROXIDE 400 MG/5ML PO SUSP: 30.0000 mL | Freq: Every day | ORAL | Status: DC | PRN

## 2019-11-28 MED ORDER — ALUM & MAG HYDROXIDE-SIMETH 200-200-20 MG/5ML PO SUSP: 30.0000 mL | ORAL | Status: DC | PRN

## 2019-11-28 MED ORDER — TRIHEXYPHENIDYL HCL 2 MG PO TABS
2.0000 mg | ORAL_TABLET | Freq: Two times a day (BID) | ORAL | Status: DC
  Administered 2019-11-28 – 2019-11-30 (×4): 2 mg via ORAL
  Filled 2019-11-28 (×6): qty 1

## 2019-11-28 MED ORDER — CARBAMAZEPINE ER 200 MG PO TB12
200.0000 mg | ORAL_TABLET | Freq: Every day | ORAL | Status: DC
  Administered 2019-11-28 – 2019-11-30 (×3): 200 mg via ORAL
  Filled 2019-11-28 (×4): qty 1

## 2019-11-28 MED ORDER — HALOPERIDOL LACTATE 5 MG/ML IJ SOLN: 5.0000 mg | Freq: Three times a day (TID) | INTRAMUSCULAR | Status: DC | PRN

## 2019-11-28 MED ORDER — NICOTINE POLACRILEX 2 MG MT GUM
CHEWING_GUM | OROMUCOSAL | Status: AC
  Filled 2019-11-28: qty 1

## 2019-11-28 MED ORDER — HYDROXYZINE HCL 25 MG PO TABS
25.0000 mg | ORAL_TABLET | Freq: Three times a day (TID) | ORAL | Status: DC | PRN
  Administered 2019-11-28 – 2019-11-29 (×3): 25 mg via ORAL
  Filled 2019-11-28 (×3): qty 1

## 2019-11-28 MED ORDER — HALOPERIDOL 5 MG PO TABS
5.0000 mg | ORAL_TABLET | Freq: Three times a day (TID) | ORAL | Status: DC | PRN
  Administered 2019-11-29: 5 mg via ORAL
  Filled 2019-11-28: qty 1

## 2019-11-28 MED ORDER — NICOTINE POLACRILEX 2 MG MT GUM
2.0000 mg | CHEWING_GUM | OROMUCOSAL | Status: DC | PRN
  Administered 2019-11-28 – 2019-11-29 (×6): 2 mg via ORAL

## 2019-11-28 MED ORDER — ACETAMINOPHEN 325 MG PO TABS
650.0000 mg | ORAL_TABLET | Freq: Four times a day (QID) | ORAL | Status: DC | PRN
  Administered 2019-11-28 – 2019-11-29 (×3): 650 mg via ORAL
  Filled 2019-11-28 (×3): qty 2

## 2019-11-28 NOTE — BHH Counselor (Signed)
CSW attempted to complete PSA, however patient was sleeping and CSW was unable to wake pt up.    Darletta Moll MSW, LCSW Clincal Social Worker  [facility]
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
## 2019-11-28 NOTE — ED Notes (Signed)
Safe transport called for transfer to [facility]
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
## 2019-11-28 NOTE — ED Notes (Signed)
Pt released to safe transport for transfer to [facility] inpatient. All questions answered. Pt A&O, NAD at time of transfer.

## 2019-11-28 NOTE — ED Notes (Signed)
Report called to Cristal Deer at [facility]

## 2019-11-28 NOTE — H&P (Signed)
Psychiatric Admission Assessment Adult  Patient Identification: [name] MRN:  [id number] Date of Evaluation:  11/28/2019 Chief Complaint:  Schizoaffective disorder, bipolar type ([place]) [F25.0] Schizoaffective disorder ([place]) [F25.9] Principal Diagnosis: <principal problem not specified> Diagnosis:  Active Problems:   Schizoaffective disorder, bipolar type ([place])   Abnormal involuntary movement   Hypertension   Schizo affective schizophrenia ([place])   Tobacco use disorder   Cannabis use disorder, moderate, dependence (HCC)   Disturbance in affect   Abnormal behavior   Psychosis ([place])   Schizoaffective disorder ([place])   Schizophrenia ([place])  History of Present Illness: Patient is seen and examined.  Patient is a 49 year old female with a reported past psychiatric history significant for schizoaffective disorder versus schizophrenia.  She was brought to the behavioral health urgent care center by law enforcement voluntarily due to experiencing visual and auditory hallucinations.  She stated that she sees small animals in the woods, and hears voices giving her commands to hurt her self and to hurt people.  She stated that she had recently been diagnosed with tardive dyskinesia.  (From a superficial evaluation today it looks more parkinsonian than it does tardive).  She was diagnosed with tardive dyskinesia by her primary care provider recently had a urinary tract infection.  She stated that the doctor did not give her any antibiotics for UTI, but gave her diuretics.  She is also a patient have been asked service, and patient received her long-acting Haldol injection 1 to 2 days ago.  Pharmacy information obtained revealed that her Haldol Decanoate injection is 75 mg IM monthly.  She apparently received this 1 to 2 days ago.  She has also been given other medications, but has not picked them off.  The patient stated that she has been given "20 pills".  She does not want to take pills, she does not  want to take medications.  She stated that the Cogentin did not help her tremor, and she wanted to be placed on Ingrezza.  She denied suicidal or homicidal ideation.  She is also apparently prescribed Cogentin, Tegretol extended release, Risperdal and trazodone.  She was admitted to the hospital for evaluation and stabilization.  Associated Signs/Symptoms: Depression Symptoms:  insomnia, psychomotor agitation, anxiety, disturbed sleep, (Hypo) Manic Symptoms:  Delusions, Impulsivity, Irritable Mood, Labiality of Mood, Anxiety Symptoms:  Excessive Worry, Psychotic Symptoms:  Delusions, Paranoia, PTSD Symptoms: Negative Total Time spent with patient: 45 minutes  Past Psychiatric History: Patient has had multiple psychiatric admissions.  Her last admission to our facility was in 02/06/2019.  She has been treated with numerous medications including the long-acting Abilify injection, iloperidone, Prolixin, Haldol, Trilafon, Seroquel, as well as Rexulti.  She is followed by ACTT services.  Is the patient at risk to self? No.  Has the patient been a risk to self in the past 6 months? No.  Has the patient been a risk to self within the distant past? No.  Is the patient a risk to others? Yes.    Has the patient been a risk to others in the past 6 months? Yes.    Has the patient been a risk to others within the distant past? No.   Prior Inpatient Therapy:   Prior Outpatient Therapy:    Alcohol Screening: 1. How often do you have a drink containing alcohol?: Never 2. How many drinks containing alcohol do you have on a typical day when you are drinking?: 1 or 2 3. How often do you have six or more drinks  on one occasion?: Never AUDIT-C Score: 0 4. How often during the last year have you found that you were not able to stop drinking once you had started?: Never 5. How often during the last year have you failed to do what was normally expected from you because of drinking?: Never 6. How often  during the last year have you needed a first drink in the morning to get yourself going after a heavy drinking session?: Never 7. How often during the last year have you had a feeling of guilt of remorse after drinking?: Never 8. How often during the last year have you been unable to remember what happened the night before because you had been drinking?: Never 9. Have you or someone else been injured as a result of your drinking?: No 10. Has a relative or friend or a doctor or another health worker been concerned about your drinking or suggested you cut down?: No Alcohol Use Disorder Identification Test Final Score (AUDIT): 0 Alcohol Brief Interventions/Follow-up: AUDIT Score <7 follow-up not indicated Substance Abuse History in the last 12 months:  Yes.   Consequences of Substance Abuse: Medical Consequences:  Her continued use of marijuana may be influencing her psychotic symptoms. Previous Psychotropic Medications: Yes  Psychological Evaluations: Yes  Past Medical History:  Past Medical History:  Diagnosis Date  . Arthritis   . Bipolar 1 disorder ([place])   . Bronchitis   . Depression   . Hemorrhoid   . Schizoaffective disorder ([place])   . Schizophrenia ([place])    Pt denies and reports it is schizoaffective disorder.   . Transfusion history     Past Surgical History:  Procedure Laterality Date  . CESAREAN SECTION     Family History:  Family History  Problem Relation Age of Onset  . Diabetes type II Other    Family Psychiatric  History: Noncontributory Tobacco Screening: Have you used any form of tobacco in the last 30 days? (Cigarettes, Smokeless Tobacco, Cigars, and/or Pipes): Yes Tobacco use, Select all that apply: 5 or more cigarettes per day Are you interested in Tobacco Cessation Medications?: No, patient refused Counseled patient on smoking cessation including recognizing danger situations, developing coping skills and basic information about quitting provided:  Refused/Declined practical counseling Social History:  Social History   Substance and Sexual Activity  Alcohol Use No     Social History   Substance and Sexual Activity  Drug Use No  . Types: Marijuana   Comment: Former Biomedical scientist Social History:                           Allergies:   Allergies  Allergen Reactions  . Anette Guarneri [Lurasidone Hcl] Nausea And Vomiting and Other (See Comments)    Reaction:  Shaking   . Abilify [Aripiprazole] Other (See Comments)    shaking  . Flagyl [Metronidazole Hcl] Nausea And Vomiting  . Prolixin [Fluphenazine] Other (See Comments)    shaking  . Risperidone And Related Other (See Comments)    shaking  . Seroquel [Quetiapine Fumarate]     Patient states she had a syncopal episode after taking medication.   . Sulfa Antibiotics Nausea And Vomiting   Lab Results:  Results for orders placed or performed during the hospital encounter of 11/27/19 (from the past 48 hour(s))  SARS Coronavirus 2 by RT PCR (hospital order, performed in [facility] hospital lab) Nasopharyngeal Nasopharyngeal Swab     Status: None  Collection Time: 11/27/19 10:38 PM   Specimen: Nasopharyngeal Swab  Result Value Ref Range   SARS Coronavirus 2 NEGATIVE NEGATIVE    Comment: (NOTE) SARS-CoV-2 target nucleic acids are NOT DETECTED.  The SARS-CoV-2 RNA is generally detectable in upper and lower respiratory specimens during the acute phase of infection. The lowest concentration of SARS-CoV-2 viral copies this assay can detect is 250 copies / mL. A negative result does not preclude SARS-CoV-2 infection and should not be used as the sole basis for treatment or other patient management decisions.  A negative result may occur with improper specimen collection / handling, submission of specimen other than nasopharyngeal swab, presence of viral mutation(s) within the areas targeted by this assay, and inadequate number of viral copies (<250 copies / mL). A  negative result must be combined with clinical observations, patient history, and epidemiological information.  Fact Sheet for Patients:   StrictlyIdeas.no  Fact Sheet for Healthcare Providers: BankingDealers.co.za  This test is not yet approved or  cleared by the [place] FDA and has been authorized for detection and/or diagnosis of SARS-CoV-2 by FDA under an Emergency Use Authorization (EUA).  This EUA will remain in effect (meaning this test can be used) for the duration of the COVID-19 declaration under Section 564(b)(1) of the Act, 21 U.S.C. section 360bbb-3(b)(1), unless the authorization is terminated or revoked sooner.  Performed at [facility], [place] [address] [place], [place] [address]   Urinalysis, Routine w reflex microscopic Nasopharyngeal Swab     Status: Abnormal   Collection Time: 11/27/19 10:38 PM  Result Value Ref Range   Color, Urine YELLOW YELLOW   APPearance CLOUDY (A) CLEAR   Specific Gravity, Urine 1.015 1.005 - 1.030   pH 5.0 5.0 - 8.0   Glucose, UA NEGATIVE NEGATIVE mg/dL   Hgb urine dipstick MODERATE (A) NEGATIVE   Bilirubin Urine NEGATIVE NEGATIVE   Ketones, ur 5 (A) NEGATIVE mg/dL   Protein, ur NEGATIVE NEGATIVE mg/dL   Nitrite NEGATIVE NEGATIVE   Leukocytes,Ua LARGE (A) NEGATIVE   RBC / HPF >50 (H) 0 - 5 RBC/hpf   WBC, UA 21-50 0 - 5 WBC/hpf   Bacteria, UA FEW (A) NONE SEEN   Squamous Epithelial / LPF 6-10 0 - 5   Mucus PRESENT     Comment: Performed at [facility], [place] [address], [place], [place] [address]  Rapid urine drug screen (hospital performed)     Status: Abnormal   Collection Time: 11/27/19 10:38 PM  Result Value Ref Range   Opiates NONE DETECTED NONE DETECTED   Cocaine NONE DETECTED NONE DETECTED   Benzodiazepines NONE DETECTED NONE DETECTED   Amphetamines NONE DETECTED NONE DETECTED   Tetrahydrocannabinol POSITIVE (A) NONE DETECTED   Barbiturates NONE DETECTED  NONE DETECTED    Comment: (NOTE) DRUG SCREEN FOR MEDICAL PURPOSES ONLY.  IF CONFIRMATION IS NEEDED FOR ANY PURPOSE, NOTIFY LAB WITHIN 5 DAYS.  LOWEST DETECTABLE LIMITS FOR URINE DRUG SCREEN Drug Class                     Cutoff (ng/mL) Amphetamine and metabolites    1000 Barbiturate and metabolites    200 Benzodiazepine                 308 Tricyclics and metabolites     300 Opiates and metabolites        300 Cocaine and metabolites        300 THC  50 Performed at [facility], [place] [address] [place], [place] [address]   POC SARS Coronavirus 2 Ag     Status: None   Collection Time: 11/27/19 10:39 PM  Result Value Ref Range   SARS Coronavirus 2 Ag NEGATIVE NEGATIVE    Comment: (NOTE) SARS-CoV-2 antigen NOT DETECTED.   Negative results are presumptive.  Negative results do not preclude SARS-CoV-2 infection and should not be used as the sole basis for treatment or other patient management decisions, including infection  control decisions, particularly in the presence of clinical signs and  symptoms consistent with COVID-19, or in those who have been in contact with the virus.  Negative results must be combined with clinical observations, patient history, and epidemiological information. The expected result is Negative.  Fact Sheet for Patients: PodPark.tn  Fact Sheet for Healthcare Providers: GiftContent.is   This test is not yet approved or cleared by the [place] FDA and  has been authorized for detection and/or diagnosis of SARS-CoV-2 by FDA under an Emergency Use Authorization (EUA).  This EUA will remain in effect (meaning this test can be used) for the duration of  the C OVID-19 declaration under Section 564(b)(1) of the Act, 21 U.S.C. section 360bbb-3(b)(1), unless the authorization is terminated or revoked sooner.    CBC with Differential/Platelet     Status: Abnormal    Collection Time: 11/27/19 10:45 PM  Result Value Ref Range   WBC 11.3 (H) 4.0 - 10.5 K/uL   RBC 3.69 (L) 3.87 - 5.11 MIL/uL   Hemoglobin 9.0 (L) 12.0 - 15.0 g/dL   HCT 29.0 (L) 36 - 46 %   MCV 78.6 (L) 80.0 - 100.0 fL   MCH 24.4 (L) 26.0 - 34.0 pg   MCHC 31.0 30.0 - 36.0 g/dL   RDW 18.1 (H) 11.5 - 15.5 %   Platelets 362 150 - 400 K/uL   nRBC 0.0 0.0 - 0.2 %   Neutrophils Relative % 56 %   Neutro Abs 6.3 1.7 - 7.7 K/uL   Lymphocytes Relative 37 %   Lymphs Abs 4.2 (H) 0.7 - 4.0 K/uL   Monocytes Relative 5 %   Monocytes Absolute 0.6 0 - 1 K/uL   Eosinophils Relative 1 %   Eosinophils Absolute 0.1 0 - 0 K/uL   Basophils Relative 1 %   Basophils Absolute 0.1 0 - 0 K/uL   Immature Granulocytes 0 %   Abs Immature Granulocytes 0.04 0.00 - 0.07 K/uL    Comment: Performed at [facility], [address], [place], [place] [address]  Comprehensive metabolic panel     Status: Abnormal   Collection Time: 11/27/19 10:45 PM  Result Value Ref Range   Sodium 135 135 - 145 mmol/L   Potassium 3.4 (L) 3.5 - 5.1 mmol/L   Chloride 102 98 - 111 mmol/L   CO2 21 (L) 22 - 32 mmol/L   Glucose, Bld 88 70 - 99 mg/dL    Comment: Glucose reference range applies only to samples taken after fasting for at least 8 hours.   BUN 7 6 - 20 mg/dL   Creatinine, Ser 0.81 0.44 - 1.00 mg/dL   Calcium 9.4 8.9 - 10.3 mg/dL   Total Protein 8.0 6.5 - 8.1 g/dL   Albumin 4.1 3.5 - 5.0 g/dL   AST 15 15 - 41 U/L   ALT 11 0 - 44 U/L   Alkaline Phosphatase 74 38 - 126 U/L   Total Bilirubin 0.6 0.3 -  1.2 mg/dL   GFR calc non Af Amer >60 >60 mL/min   GFR calc Af Amer >60 >60 mL/min   Anion gap 12 5 - 15    Comment: Performed at [facility] [address], [place], [place] [address]  Ethanol     Status: None   Collection Time: 11/27/19 10:45 PM  Result Value Ref Range   Alcohol, Ethyl (B) <10 <10 mg/dL    Comment: (NOTE) Lowest detectable limit for serum alcohol is 10 mg/dL.  For medical purposes  only. Performed at [facility], [place] [address], [place], [place] [address]   Lipid panel     Status: Abnormal   Collection Time: 11/27/19 10:45 PM  Result Value Ref Range   Cholesterol 193 0 - 200 mg/dL   Triglycerides 75 <150 mg/dL   HDL 54 >40 mg/dL   Total CHOL/HDL Ratio 3.6 RATIO   VLDL 15 0 - 40 mg/dL   LDL Cholesterol 124 (H) 0 - 99 mg/dL    Comment:        Total Cholesterol/HDL:CHD Risk Coronary Heart Disease Risk Table                     Men   Women  1/2 Average Risk   3.4   3.3  Average Risk       5.0   4.4  2 X Average Risk   9.6   7.1  3 X Average Risk  23.4   11.0        Use the calculated Patient Ratio above and the CHD Risk Table to determine the patient's CHD Risk.        ATP III CLASSIFICATION (LDL):  <100     mg/dL   Optimal  100-129  mg/dL   Near or Above                    Optimal  130-159  mg/dL   Borderline  160-189  mg/dL   High  >190     mg/dL   Very High Performed at [facility] [address] [place], [place] [address]   TSH     Status: None   Collection Time: 11/27/19 10:45 PM  Result Value Ref Range   TSH 3.181 0.350 - 4.500 uIU/mL    Comment: Performed by a 3rd Generation assay with a functional sensitivity of <=0.01 uIU/mL. Performed at [facility], [place] [address] [place], [place] [address]     Blood Alcohol level:  Lab Results  Component Value Date   [facility] <10 11/27/2019   ETH <10 17/91/5056    Metabolic Disorder Labs:  Lab Results  Component Value Date   HGBA1C 6.3 (H) 01/30/2017   MPG 134.11 01/30/2017   MPG 128 05/11/2015   No results found for: PROLACTIN Lab Results  Component Value Date   CHOL 193 11/27/2019   TRIG 75 11/27/2019   HDL 54 11/27/2019   CHOLHDL 3.6 11/27/2019   VLDL 15 11/27/2019   LDLCALC 124 (H) 11/27/2019   LDLCALC 93 01/29/2017    Current Medications: Current Facility-Administered Medications  Medication Dose Route Frequency Provider Last Rate Last Admin  .  acetaminophen (TYLENOL) tablet 650 mg  650 mg Oral Q6H PRN Sharma Covert, MD      . alum & mag hydroxide-simeth (MAALOX/MYLANTA) 200-200-20 MG/5ML suspension 30 mL  30 mL Oral Q4H PRN Caroline Sauger, NP      .  carbamazepine (TEGRETOL XR) 12 hr tablet 200 mg  200 mg Oral Daily Sharma Covert, MD   200 mg at 11/28/19 1058  . haloperidol (HALDOL) tablet 5 mg  5 mg Oral Q8H PRN Sharma Covert, MD       Or  . haloperidol lactate (HALDOL) injection 5 mg  5 mg Intramuscular Q8H PRN Sharma Covert, MD      . hydrOXYzine (ATARAX/VISTARIL) tablet 25 mg  25 mg Oral TID PRN Sharma Covert, MD      . magnesium hydroxide (MILK OF MAGNESIA) suspension 30 mL  30 mL Oral Daily PRN Caroline Sauger, NP      . primidone (MYSOLINE) tablet 50 mg  50 mg Oral Q8H Alysha Doolan, Cordie Grice, MD      . traZODone (DESYREL) tablet 50 mg  50 mg Oral QHS PRN Sharma Covert, MD       PTA Medications: Facility-Administered Medications Prior to Admission  Medication Dose Route Frequency Provider Last Rate Last Admin  . [DISCONTINUED] haloperidol decanoate (HALDOL DECANOATE) 50 MG/ML injection 75 mg  75 mg Intramuscular Q30 days [name], MD       Medications Prior to Admission  Medication Sig Dispense Refill Last Dose  . bethanechol (URECHOLINE) 5 MG tablet Take 5 mg by mouth daily.   Past Month at Unknown time  . haloperidol decanoate (HALDOL DECANOATE) 100 MG/ML injection Inject 0.75 mLs (75 mg total) into the muscle once for 1 dose. (Patient taking differently: Inject 75 mg into the muscle every 28 (twenty-eight) days. ) 1 mL 11 Past Month at Unknown time    Musculoskeletal: Strength & Muscle Tone: within normal limits Gait & Station: normal Patient leans: N/A  Psychiatric Specialty Exam: Physical Exam Vitals and nursing note reviewed.  Constitutional:      Appearance: She is obese.  HENT:     Head: Normocephalic and atraumatic.  Pulmonary:     Effort: Pulmonary effort is normal.   Neurological:     General: No focal deficit present.     Mental Status: She is alert and oriented to person, place, and time.     Review of Systems  Blood pressure (!) 113/56, pulse 79, temperature 98.2 F (36.8 C), temperature source Oral, resp. rate 18, height 5\' 4"  (1.626 m), weight 112 kg, last menstrual period 11/12/2019, SpO2 100 %.Body mass index is 42.4 kg/m.  General Appearance: Disheveled  Eye Contact:  Good  Speech:  Pressured  Volume:  Increased  Mood:  Anxious, Dysphoric and Irritable  Affect:  Labile  Thought Process:  Goal Directed and Descriptions of Associations: Loose  Orientation:  Full (Time, Place, and Person)  Thought Content:  Delusions and Paranoid Ideation  Suicidal Thoughts:  No  Homicidal Thoughts:  No  Memory:  Immediate;   Fair Recent;   Fair Remote;   Fair  Judgement:  Impaired  Insight:  Lacking  Psychomotor Activity:  Increased  Concentration:  Concentration: Fair and Attention Span: Fair  Recall:  [employer] of Knowledge:  Fair  Language:  Good  Akathisia:  Yes  Handed:  Right  AIMS (if indicated):     Assets:  Desire for Improvement Housing Resilience Social Support  ADL's:  Intact  Cognition:  WNL  Sleep:  Number of Hours: 0    Treatment Plan Summary: Daily contact with patient to assess and evaluate symptoms and progress in treatment, Medication management and Plan Patient is seen and examined.  Patient is a 49 year old female  with the above-stated past psychiatric history who is admitted secondary to worsening psychosis.  She will be admitted to the hospital.  She will be integrated in the milieu.  She will be encouraged to attend groups.  She stated she is mostly taking "20 pills", but I do not see that she has been prescribed that amount of medication.  On a brief examination from a neurological standpoint she does appear to have parkinsonian symptoms, but not tardive dyskinesia.  Her last psychiatric hospitalization in our facility  was in 02/06/2019.  At that time she was prescribed Cogentin, Tegretol, gabapentin, haloperidol 20 mg p.o. daily orally as well as Haldol decanoate 75 mg every 30 days.  She also received primidone for tremor.  Her discharge summary at that point stated that she had had numerous medication trials with limited success including a long-acting Abilify, iloperidone, Prolixin, Haldol, Trilafon, Seroquel as well as Rexulti.  Her movement disorder at that time was treated with Mysoline as well as Cogentin.  I do not see a neurological note in here.  She also had been previously diagnosed with bipolar disorder, and she certainly looks more bipolar or schizoaffective disorder; bipolar then.  Schizophrenia.  We will have to collect some more collateral information on her before we necessarily know what to do in the long run.  She is still irritable, and I restarted her Tegretol-XR at 200 mg p.o. daily.  I will write for as needed Haldol in the short run.  I have also written for urinalysis to make sure about any possible urinary tract infections.  Review of her admission laboratories revealed a mildly low potassium at 3.4.  That will be supplemented.  Liver function enzymes were normal.  Her lipid panel was normal.  She does have a mild anemia with a hemoglobin of 9 and hematocrit of 29.0.  Her MCV is low at 78.6.  Platelets were normal at 362,000.  Her last CBC 6 months ago revealed a hemoglobin of 10.2 and hematocrit of 32.3.  Her MCV was normal at that time.  Unfortunately the only notes in the chart are from emergency room visits and psychiatric visit so it is unclear whether or not her anemia has been worked up adequately.  As well, in care everywhere the only diagnosis is from psychiatric and emergency room visits.  We will order an iron and anemia panel just to find out the origin of this.  Her TSH was normal at 3.181.  Urinalysis from 8/10 was abnormal, but had 6-10 squamous epithelial cells.  There was 50 red blood  cells as well.  We will repeat her urinalysis.  There is a possibility that she has something going on with her hematuria that may be leading to the anemia.  Drug screen was completely negative except for marijuana.  EKG showed a normal sinus rhythm with normal QTc interval.  Her blood pressure was 113/56, pulse was 79.  Observation Level/Precautions:  15 minute checks  Laboratory:  Chemistry Profile  Psychotherapy:    Medications:    Consultations:    Discharge Concerns:    Estimated LOS:  Other:     Physician Treatment Plan for Primary Diagnosis: <principal problem not specified> Long Term Goal(s): Improvement in symptoms so as ready for discharge  Short Term Goals: Ability to identify changes in lifestyle to reduce recurrence of condition will improve, Ability to verbalize feelings will improve, Ability to demonstrate self-control will improve, Ability to identify and develop effective coping behaviors will improve, Ability  to maintain clinical measurements within normal limits will improve, Compliance with prescribed medications will improve and Ability to identify triggers associated with substance abuse/mental health issues will improve  Physician Treatment Plan for Secondary Diagnosis: Active Problems:   Schizoaffective disorder, bipolar type (HCC)   Abnormal involuntary movement   Hypertension   Schizo affective schizophrenia ([place])   Tobacco use disorder   Cannabis use disorder, moderate, dependence (HCC)   Disturbance in affect   Abnormal behavior   Psychosis ([place])   Schizoaffective disorder ([place])   Schizophrenia ([place])  Long Term Goal(s): Improvement in symptoms so as ready for discharge  Short Term Goals: Ability to identify changes in lifestyle to reduce recurrence of condition will improve, Ability to verbalize feelings will improve, Ability to demonstrate self-control will improve, Ability to identify and develop effective coping behaviors will improve, Ability to maintain  clinical measurements within normal limits will improve, Compliance with prescribed medications will improve and Ability to identify triggers associated with substance abuse/mental health issues will improve  I certify that inpatient services furnished can reasonably be expected to improve the patient's condition.    Sharma Covert, MD 8/11/20212:01 PM

## 2019-11-28 NOTE — ED Notes (Signed)
Per Maudie Mercury [facility] @ [facility] pt accepted to [phone] pending PCR Covid results

## 2019-11-28 NOTE — [facility] Suicide Risk Assessment (Signed)
[facility] Admission Suicide Risk Assessment   Nursing information obtained from:  Patient Demographic factors:  Low socioeconomic status, Unemployed Current Mental Status:  Suicidal ideation indicated by patient Loss Factors:  NA Historical Factors:  NA Risk Reduction Factors:  Positive social support  Total Time spent with patient: 30 minutes Principal Problem: <principal problem not specified> Diagnosis:  Active Problems:   Schizoaffective disorder, bipolar type (HCC)   Abnormal involuntary movement   Hypertension   Schizo affective schizophrenia ([place])   Tobacco use disorder   Cannabis use disorder, moderate, dependence (HCC)   Disturbance in affect   Abnormal behavior   Psychosis ([place])   Schizoaffective disorder (HCC)   Schizophrenia (HCC)  Subjective Data: Patient is seen and examined.  Patient is a 49 year old female with a reported past psychiatric history #10 first Ezol affective disorder versus schizophrenia.  She was brought to the behavioral health urgent care center by law enforcement voluntarily due to experiencing visual and auditory hallucinations.  She stated that she sees small animals in the woods, and hears voices giving her commands to hurt her self and to hurt people.  She stated that she had recently been diagnosed with tardive dyskinesia.  (From a superficial evaluation today it looks more parkinsonian than it does tardive).  She was diagnosed with tardive dyskinesia by her primary care provider recently had a urinary tract infection.  She stated that the doctor did not give her any antibiotics for UTI, but gave her diuretics.  She is also a patient have been asked service, and patient received her long-acting Haldol injection 1 to 2 days ago.  Pharmacy information obtained revealed that her Haldol Decanoate injection is 75 mg IM monthly.  She apparently received this 1 to 2 days ago.  She has also been given other medications, but has not picked them off.  The patient stated  that she has been given "20 pills".  She does not want to take pills, she does not want to take medications.  She stated that the Cogentin did not help her tremor, and she wanted to be placed on Ingrezza.  She denied suicidal or homicidal ideation.  She is also apparently prescribed Cogentin, Tegretol extended release, Risperdal and trazodone.  She was admitted to the hospital for evaluation and stabilization.  Continued Clinical Symptoms:  Alcohol Use Disorder Identification Test Final Score (AUDIT): 0 The "Alcohol Use Disorders Identification Test", Guidelines for Use in Primary Care, Second Edition.  World Pharmacologist [facility]). Score between 0-7:  no or low risk or alcohol related problems. Score between 8-15:  moderate risk of alcohol related problems. Score between 16-19:  high risk of alcohol related problems. Score 20 or above:  warrants further diagnostic evaluation for alcohol dependence and treatment.   CLINICAL FACTORS:   Schizophrenia:   Command hallucinatons Paranoid or undifferentiated type   Musculoskeletal: Strength & Muscle Tone: within normal limits Gait & Station: normal Patient leans: N/A  Psychiatric Specialty Exam: Physical Exam Vitals and nursing note reviewed.  Constitutional:      Appearance: She is obese.  HENT:     Head: Normocephalic and atraumatic.  Pulmonary:     Effort: Pulmonary effort is normal.  Neurological:     Mental Status: She is alert and oriented to person, place, and time.     Review of Systems  Blood pressure (!) 113/56, pulse 79, temperature 98.2 F (36.8 C), temperature source Oral, resp. rate 18, height 5\' 4"  (1.626 m), weight 112 kg, last menstrual period  11/12/2019, SpO2 100 %.Body mass index is 42.4 kg/m.  General Appearance: Disheveled  Eye Contact:  Good  Speech:  Pressured  Volume:  Increased  Mood:  Irritable  Affect:  Congruent  Thought Process:  Coherent and Descriptions of Associations: Circumstantial   Orientation:  Full (Time, Place, and Person)  Thought Content:  Delusions and Hallucinations: Auditory  Suicidal Thoughts:  No  Homicidal Thoughts:  No  Memory:  Immediate;   Fair Recent;   Fair Remote;   Fair  Judgement:  Impaired  Insight:  Lacking  Psychomotor Activity:  Increased  Concentration:  Concentration: Poor and Attention Span: Poor  Recall:  Poor  Fund of Knowledge:  Fair  Language:  Fair  Akathisia:  Yes  Handed:  Right  AIMS (if indicated):     Assets:  Desire for Improvement Housing Resilience  ADL's:  Intact  Cognition:  WNL  Sleep:  Number of Hours: 0      COGNITIVE FEATURES THAT CONTRIBUTE TO RISK:  Closed-mindedness    SUICIDE RISK:   Mild:  Suicidal ideation of limited frequency, intensity, duration, and specificity.  There are no identifiable plans, no associated intent, mild dysphoria and related symptoms, good self-control (both objective and subjective assessment), few other risk factors, and identifiable protective factors, including available and accessible social support.  PLAN OF CARE: Patient is seen and examined.  Patient is a 49 year old female with the above-stated past psychiatric history who is admitted secondary to worsening psychosis.  She will be admitted to the hospital.  She will be integrated in the milieu.  She will be encouraged to attend groups.  She stated she is mostly taking "20 pills", but I do not see that she has been prescribed that amount of medication.  On a brief examination from a neurological standpoint she does appear to have parkinsonian symptoms, but not tardive dyskinesia.  Her last psychiatric hospitalization in our facility was in 02/06/2019.  At that time she was prescribed Cogentin, Tegretol, gabapentin, haloperidol 20 mg p.o. daily orally as well as Haldol decanoate 75 mg every 30 days.  She also received primidone for tremor.  Her discharge summary at that point stated that she had had numerous medication trials with  limited success including a long-acting Abilify, iloperidone, Prolixin, Haldol, Trilafon, Seroquel as well as Rexulti.  Her movement disorder at that time was treated with Mysoline as well as Cogentin.  I do not see a neurological note in here.  She also had been previously diagnosed with bipolar disorder, and she certainly looks more bipolar or schizoaffective disorder; bipolar then.  Schizophrenia.  We will have to collect some more collateral information on her before we necessarily know what to do in the long run.  She is still irritable, and I restarted her Tegretol-XR at 200 mg p.o. daily.  I will write for as needed Haldol in the short run.  I have also written for urinalysis to make sure about any possible urinary tract infections.  Review of her admission laboratories revealed a mildly low potassium at 3.4.  That will be supplemented.  Liver function enzymes were normal.  Her lipid panel was normal.  She does have a mild anemia with a hemoglobin of 9 and hematocrit of 29.0.  Her MCV is low at 78.6.  Platelets were normal at 362,000.  Her last CBC 6 months ago revealed a hemoglobin of 10.2 and hematocrit of 32.3.  Her MCV was normal at that time.  Unfortunately the only notes in  the chart are from emergency room visits and psychiatric visit so it is unclear whether or not her anemia has been worked up adequately.  As well, in care everywhere the only diagnosis is from psychiatric and emergency room visits.  We will order an iron and anemia panel just to find out the origin of this.  Her TSH was normal at 3.181.  Urinalysis from 8/10 was abnormal, but had 6-10 squamous epithelial cells.  There was 50 red blood cells as well.  We will repeat her urinalysis.  There is a possibility that she has something going on with her hematuria that may be leading to the anemia.  Drug screen was completely negative except for marijuana.  EKG showed a normal sinus rhythm with normal QTc interval.  Her blood pressure was  113/56, pulse was 79.  I certify that inpatient services furnished can reasonably be expected to improve the patient's condition.   Sharma Covert, MD 11/28/2019, 11:37 AM

## 2019-11-28 NOTE — Progress Notes (Signed)
   11/28/19 1800  Psych Admission Type (Psych Patients Only)  Admission Status Voluntary  Psychosocial Assessment  Patient Complaints Anxiety;Shakiness  Eye Contact Fair  Facial Expression Anxious  Affect Anxious  Speech Slow  Interaction Assertive  Motor Activity Tremors  Appearance/Hygiene Disheveled  Behavior Characteristics Cooperative  Mood Anxious  Aggressive Behavior  Effect No apparent injury  Thought Process  Coherency WDL  Content WDL  Delusions WDL  Perception Hallucinations  Hallucination Auditory;Command  Judgment Impaired  Confusion WDL  Danger to Self  Current suicidal ideation? Passive  Self-Injurious Behavior No self-injurious ideation or behavior indicators observed or expressed   Agreement Not to Harm Self Yes  Description of Agreement verbal contract for safety  Danger to Others  Danger to Others None reported or observed

## 2019-11-29 DIAGNOSIS — F250 Schizoaffective disorder, bipolar type: Principal | ICD-10-CM

## 2019-11-29 DIAGNOSIS — F1220 Cannabis dependence, uncomplicated: Secondary | ICD-10-CM

## 2019-11-29 DIAGNOSIS — I10 Essential (primary) hypertension: Secondary | ICD-10-CM

## 2019-11-29 DIAGNOSIS — F259 Schizoaffective disorder, unspecified: Secondary | ICD-10-CM

## 2019-11-29 DIAGNOSIS — R259 Unspecified abnormal involuntary movements: Secondary | ICD-10-CM

## 2019-11-29 LAB — URINALYSIS, COMPLETE (UACMP) WITH MICROSCOPIC
Bilirubin Urine: NEGATIVE
Glucose, UA: NEGATIVE mg/dL
Ketones, ur: NEGATIVE mg/dL
Nitrite: NEGATIVE
Protein, ur: NEGATIVE mg/dL
Specific Gravity, Urine: 1.014 (ref 1.005–1.030)
pH: 6 (ref 5.0–8.0)

## 2019-11-29 LAB — VITAMIN B12: Vitamin B-12: 221 pg/mL (ref 180–914)

## 2019-11-29 LAB — CARBAMAZEPINE LEVEL, TOTAL: Carbamazepine Lvl: 2.9 ug/mL — ABNORMAL LOW (ref 4.0–12.0)

## 2019-11-29 MED ORDER — NAPROXEN 500 MG PO TABS
500.0000 mg | ORAL_TABLET | Freq: Once | ORAL | Status: AC
  Administered 2019-11-29: 500 mg via ORAL
  Filled 2019-11-29 (×2): qty 1

## 2019-11-29 MED ORDER — IBUPROFEN 600 MG PO TABS
600.0000 mg | ORAL_TABLET | Freq: Four times a day (QID) | ORAL | Status: DC | PRN
  Filled 2019-11-29: qty 1

## 2019-11-29 NOTE — Progress Notes (Signed)
[facility] MD Progress Note  11/29/2019 3:13 PM [name]  MRN:  [id number] Subjective: Patient is a 49 year old female with a reported past psychiatric history significant for schizoaffective disorder; bipolar type versus schizophrenia.  Patient was admitted on 11/28/2019 after worsening auditory hallucinations.  Objective: Patient is seen and examined.  Patient is a 49 year old female with the above-stated past psychiatric history who is seen in follow-up.  She is doing well.  She denied any auditory hallucinations today.  She still focuses on her tremor.  I think it is an essential tremor, and certainly not tardive dyskinesia.  She stated that she had noticed that whenever she gets angry her anxious to tremor gets worse.  We discussed that.  We discussed potentially seeing a neurologist after discharge to clarify the diagnosis.  I told her I placed her back on the Mysoline as well as Artane for side effects of the Haldol and essential tremor.  Again she denied any auditory or visual hallucinations.  She denied any suicidal homicidal ideation.  She does appear to be somewhat paranoid, but overall pleasant and engaging.  She is focused on the fact that they have told her previously that her Haldol Decanoate dosage is 15 mg, and I mentioned to her that it was 75 mg.  She is somewhat distressed by that.  She stated that she needs to be on the least amount of medicine possible.  I told her that she is basically on 3-4 oral medicines and Haldol and that was it.  Vital signs are stable, she is afebrile.  She denied any problems with her sleep.  Her repeat urinalysis from yesterday again was a contaminated sample.  There were 6-10 squamous epithelial cells.  She denied any urinary symptoms.  Principal Problem: <principal problem not specified> Diagnosis: Active Problems:   Schizoaffective disorder, bipolar type (HCC)   Abnormal involuntary movement   Hypertension   Schizo affective schizophrenia ([place])    Tobacco use disorder   Cannabis use disorder, moderate, dependence (HCC)   Disturbance in affect   Abnormal behavior   Psychosis ([place])   Schizoaffective disorder (HCC)   Schizophrenia ([place])  Total Time spent with patient: 20 minutes  Past Psychiatric History: See admission H&P  Past Medical History:  Past Medical History:  Diagnosis Date  . Arthritis   . Bipolar 1 disorder ([place])   . Bronchitis   . Depression   . Hemorrhoid   . Schizoaffective disorder ([place])   . Schizophrenia ([place])    Pt denies and reports it is schizoaffective disorder.   . Transfusion history     Past Surgical History:  Procedure Laterality Date  . CESAREAN SECTION     Family History:  Family History  Problem Relation Age of Onset  . Diabetes type II Other    Family Psychiatric  History: See admission H&P Social History:  Social History   Substance and Sexual Activity  Alcohol Use No     Social History   Substance and Sexual Activity  Drug Use No  . Types: Marijuana   Comment: Former user    Social History   Socioeconomic History  . Marital status: Legally Separated    Spouse name: Not on file  . Number of children: Not on file  . Years of education: Not on file  . Highest education level: Not on file  Occupational History  . Not on file  Tobacco Use  . Smoking status: Current Every Day Smoker    Packs/day: 0.50  Years: 17.00    Pack years: 8.50    Types: Cigarettes  . Smokeless tobacco: Never Used  Vaping Use  . Vaping Use: Never used  Substance and Sexual Activity  . Alcohol use: No  . Drug use: No    Types: Marijuana    Comment: Former user  . Sexual activity: Not Currently  Other Topics Concern  . Not on file  Social History Narrative   ** Merged History Encounter **       Social Determinants of Health   Financial Resource Strain:   . Difficulty of Paying Living Expenses:   Food Insecurity:   . Worried About Charity fundraiser in the Last Year:   . Youth worker in the Last Year:   Transportation Needs:   . Film/video editor (Medical):   Marland Kitchen Lack of Transportation (Non-Medical):   Physical Activity:   . Days of Exercise per Week:   . Minutes of Exercise per Session:   Stress:   . Feeling of Stress :   Social Connections:   . Frequency of Communication with Friends and Family:   . Frequency of Social Gatherings with Friends and Family:   . Attends Religious Services:   . Active Member of Clubs or Organizations:   . Attends Archivist Meetings:   Marland Kitchen Marital Status:    Additional Social History:                         Sleep: Good  Appetite:  Good  Current Medications: Current Facility-Administered Medications  Medication Dose Route Frequency Provider Last Rate Last Admin  . acetaminophen (TYLENOL) tablet 650 mg  650 mg Oral Q6H PRN Sharma Covert, MD   650 mg at 11/29/19 0904  . alum & mag hydroxide-simeth (MAALOX/MYLANTA) 200-200-20 MG/5ML suspension 30 mL  30 mL Oral Q4H PRN Caroline Sauger, NP      . carbamazepine (TEGRETOL XR) 12 hr tablet 200 mg  200 mg Oral Daily Sharma Covert, MD   200 mg at 11/29/19 0759  . haloperidol (HALDOL) tablet 5 mg  5 mg Oral Q8H PRN Sharma Covert, MD   5 mg at 11/29/19 1152   Or  . haloperidol lactate (HALDOL) injection 5 mg  5 mg Intramuscular Q8H PRN Sharma Covert, MD      . hydrOXYzine (ATARAX/VISTARIL) tablet 25 mg  25 mg Oral TID PRN Sharma Covert, MD   25 mg at 11/29/19 1152  . magnesium hydroxide (MILK OF MAGNESIA) suspension 30 mL  30 mL Oral Daily PRN Caroline Sauger, NP      . nicotine polacrilex (NICORETTE) gum 2 mg  2 mg Oral PRN Caroline Sauger, NP   2 mg at 11/29/19 0093  . primidone (MYSOLINE) tablet 50 mg  50 mg Oral Q8H Sharma Covert, MD   50 mg at 11/29/19 1352  . traZODone (DESYREL) tablet 50 mg  50 mg Oral QHS PRN Sharma Covert, MD   50 mg at 11/28/19 2351  . trihexyphenidyl (ARTANE) tablet 2 mg  2 mg Oral  BID WC Sharma Covert, MD   2 mg at 11/29/19 8182    Lab Results:  Results for orders placed or performed during the hospital encounter of 11/27/19 (from the past 48 hour(s))  SARS Coronavirus 2 by RT PCR (hospital order, performed in [facility] hospital lab) Nasopharyngeal Nasopharyngeal Swab     Status: None  Collection Time: 11/27/19 10:38 PM   Specimen: Nasopharyngeal Swab  Result Value Ref Range   SARS Coronavirus 2 NEGATIVE NEGATIVE    Comment: (NOTE) SARS-CoV-2 target nucleic acids are NOT DETECTED.  The SARS-CoV-2 RNA is generally detectable in upper and lower respiratory specimens during the acute phase of infection. The lowest concentration of SARS-CoV-2 viral copies this assay can detect is 250 copies / mL. A negative result does not preclude SARS-CoV-2 infection and should not be used as the sole basis for treatment or other patient management decisions.  A negative result may occur with improper specimen collection / handling, submission of specimen other than nasopharyngeal swab, presence of viral mutation(s) within the areas targeted by this assay, and inadequate number of viral copies (<250 copies / mL). A negative result must be combined with clinical observations, patient history, and epidemiological information.  Fact Sheet for Patients:   StrictlyIdeas.no  Fact Sheet for Healthcare Providers: BankingDealers.co.za  This test is not yet approved or  cleared by the [place] FDA and has been authorized for detection and/or diagnosis of SARS-CoV-2 by FDA under an Emergency Use Authorization (EUA).  This EUA will remain in effect (meaning this test can be used) for the duration of the COVID-19 declaration under Section 564(b)(1) of the Act, 21 U.S.C. section 360bbb-3(b)(1), unless the authorization is terminated or revoked sooner.  Performed at [facility], [place] [address], [place],  [place] [address]   Urinalysis, Routine w reflex microscopic Nasopharyngeal Swab     Status: Abnormal   Collection Time: 11/27/19 10:38 PM  Result Value Ref Range   Color, Urine YELLOW YELLOW   APPearance CLOUDY (A) CLEAR   Specific Gravity, Urine 1.015 1.005 - 1.030   pH 5.0 5.0 - 8.0   Glucose, UA NEGATIVE NEGATIVE mg/dL   Hgb urine dipstick MODERATE (A) NEGATIVE   Bilirubin Urine NEGATIVE NEGATIVE   Ketones, ur 5 (A) NEGATIVE mg/dL   Protein, ur NEGATIVE NEGATIVE mg/dL   Nitrite NEGATIVE NEGATIVE   Leukocytes,Ua LARGE (A) NEGATIVE   RBC / HPF >50 (H) 0 - 5 RBC/hpf   WBC, UA 21-50 0 - 5 WBC/hpf   Bacteria, UA FEW (A) NONE SEEN   Squamous Epithelial / LPF 6-10 0 - 5   Mucus PRESENT     Comment: Performed at [facility], [place] [address], [place], [place] [address]  Rapid urine drug screen (hospital performed)     Status: Abnormal   Collection Time: 11/27/19 10:38 PM  Result Value Ref Range   Opiates NONE DETECTED NONE DETECTED   Cocaine NONE DETECTED NONE DETECTED   Benzodiazepines NONE DETECTED NONE DETECTED   Amphetamines NONE DETECTED NONE DETECTED   Tetrahydrocannabinol POSITIVE (A) NONE DETECTED   Barbiturates NONE DETECTED NONE DETECTED    Comment: (NOTE) DRUG SCREEN FOR MEDICAL PURPOSES ONLY.  IF CONFIRMATION IS NEEDED FOR ANY PURPOSE, NOTIFY LAB WITHIN 5 DAYS.  LOWEST DETECTABLE LIMITS FOR URINE DRUG SCREEN Drug Class                     Cutoff (ng/mL) Amphetamine and metabolites    1000 Barbiturate and metabolites    200 Benzodiazepine                 702 Tricyclics and metabolites     300 Opiates and metabolites        300 Cocaine and metabolites        300 THC  50 Performed at [facility], [place] [address], [place], [place] [address]   POC SARS Coronavirus 2 Ag     Status: None   Collection Time: 11/27/19 10:39 PM  Result Value Ref Range   SARS Coronavirus 2 Ag NEGATIVE NEGATIVE    Comment: (NOTE) SARS-CoV-2 antigen NOT  DETECTED.   Negative results are presumptive.  Negative results do not preclude SARS-CoV-2 infection and should not be used as the sole basis for treatment or other patient management decisions, including infection  control decisions, particularly in the presence of clinical signs and  symptoms consistent with COVID-19, or in those who have been in contact with the virus.  Negative results must be combined with clinical observations, patient history, and epidemiological information. The expected result is Negative.  Fact Sheet for Patients: PodPark.tn  Fact Sheet for Healthcare Providers: GiftContent.is   This test is not yet approved or cleared by the [place] FDA and  has been authorized for detection and/or diagnosis of SARS-CoV-2 by FDA under an Emergency Use Authorization (EUA).  This EUA will remain in effect (meaning this test can be used) for the duration of  the C OVID-19 declaration under Section 564(b)(1) of the Act, 21 U.S.C. section 360bbb-3(b)(1), unless the authorization is terminated or revoked sooner.    CBC with Differential/Platelet     Status: Abnormal   Collection Time: 11/27/19 10:45 PM  Result Value Ref Range   WBC 11.3 (H) 4.0 - 10.5 K/uL   RBC 3.69 (L) 3.87 - 5.11 MIL/uL   Hemoglobin 9.0 (L) 12.0 - 15.0 g/dL   HCT 29.0 (L) 36 - 46 %   MCV 78.6 (L) 80.0 - 100.0 fL   MCH 24.4 (L) 26.0 - 34.0 pg   MCHC 31.0 30.0 - 36.0 g/dL   RDW 18.1 (H) 11.5 - 15.5 %   Platelets 362 150 - 400 K/uL   nRBC 0.0 0.0 - 0.2 %   Neutrophils Relative % 56 %   Neutro Abs 6.3 1.7 - 7.7 K/uL   Lymphocytes Relative 37 %   Lymphs Abs 4.2 (H) 0.7 - 4.0 K/uL   Monocytes Relative 5 %   Monocytes Absolute 0.6 0 - 1 K/uL   Eosinophils Relative 1 %   Eosinophils Absolute 0.1 0 - 0 K/uL   Basophils Relative 1 %   Basophils Absolute 0.1 0 - 0 K/uL   Immature Granulocytes 0 %   Abs Immature Granulocytes 0.04 0.00 - 0.07  K/uL    Comment: Performed at [facility], [address] [place],  [address]  Comprehensive metabolic panel     Status: Abnormal   Collection Time: 11/27/19 10:45 PM  Result Value Ref Range   Sodium 135 135 - 145 mmol/L   Potassium 3.4 (L) 3.5 - 5.1 mmol/L   Chloride 102 98 - 111 mmol/L   CO2 21 (L) 22 - 32 mmol/L   Glucose, Bld 88 70 - 99 mg/dL    Comment: Glucose reference range applies only to samples taken after fasting for at least 8 hours.   BUN 7 6 - 20 mg/dL   Creatinine, Ser 0.81 0.44 - 1.00 mg/dL   Calcium 9.4 8.9 - 10.3 mg/dL   Total Protein 8.0 6.5 - 8.1 g/dL   Albumin 4.1 3.5 - 5.0 g/dL   AST 15 15 - 41 U/L   ALT 11 0 - 44 U/L   Alkaline Phosphatase 74 38 - 126 U/L   Total Bilirubin 0.6  0.3 - 1.2 mg/dL   GFR calc non Af Amer >60 >60 mL/min   GFR calc Af Amer >60 >60 mL/min   Anion gap 12 5 - 15    Comment: Performed at [facility] [address], [place], [place] [address]  Ethanol     Status: None   Collection Time: 11/27/19 10:45 PM  Result Value Ref Range   Alcohol, Ethyl (B) <10 <10 mg/dL    Comment: (NOTE) Lowest detectable limit for serum alcohol is 10 mg/dL.  For medical purposes only. Performed at [facility], [place] [address], [place], [place] [address]   Lipid panel     Status: Abnormal   Collection Time: 11/27/19 10:45 PM  Result Value Ref Range   Cholesterol 193 0 - 200 mg/dL   Triglycerides 75 <150 mg/dL   HDL 54 >40 mg/dL   Total CHOL/HDL Ratio 3.6 RATIO   VLDL 15 0 - 40 mg/dL   LDL Cholesterol 124 (H) 0 - 99 mg/dL    Comment:        Total Cholesterol/HDL:CHD Risk Coronary Heart Disease Risk Table                     Men   Women  1/2 Average Risk   3.4   3.3  Average Risk       5.0   4.4  2 X Average Risk   9.6   7.1  3 X Average Risk  23.4   11.0        Use the calculated Patient Ratio above and the CHD Risk Table to determine the patient's CHD Risk.        ATP III CLASSIFICATION (LDL):  <100     mg/dL    Optimal  100-129  mg/dL   Near or Above                    Optimal  130-159  mg/dL   Borderline  160-189  mg/dL   High  >190     mg/dL   Very High Performed at [facility] [address] [place], [place] [address]   TSH     Status: None   Collection Time: 11/27/19 10:45 PM  Result Value Ref Range   TSH 3.181 0.350 - 4.500 uIU/mL    Comment: Performed by a 3rd Generation assay with a functional sensitivity of <=0.01 uIU/mL. Performed at [facility], [place] [address] [place], [place] [address]     Blood Alcohol level:  Lab Results  Component Value Date   [facility] <10 11/27/2019   ETH <10 88/28/0034    Metabolic Disorder Labs: Lab Results  Component Value Date   HGBA1C 6.3 (H) 01/30/2017   MPG 134.11 01/30/2017   MPG 128 05/11/2015   No results found for: PROLACTIN Lab Results  Component Value Date   CHOL 193 11/27/2019   TRIG 75 11/27/2019   HDL 54 11/27/2019   CHOLHDL 3.6 11/27/2019   VLDL 15 11/27/2019   LDLCALC 124 (H) 11/27/2019   LDLCALC 93 01/29/2017    Physical Findings: AIMS: Facial and Oral Movements Muscles of Facial Expression: Minimal Lips and Perioral Area: Minimal Jaw: None, normal Tongue: None, normal,Extremity Movements Upper (arms, wrists, hands, fingers): Minimal Lower (legs, knees, ankles, toes): None, normal, Trunk Movements Neck, shoulders, hips: None, normal, Overall Severity Severity of abnormal movements (highest score from questions above): Minimal Incapacitation due to abnormal movements: None,  normal Patient's awareness of abnormal movements (rate only patient's report): Aware, no distress, Dental Status Current problems with teeth and/or dentures?: No Does patient usually wear dentures?: No  CIWA:  CIWA-Ar Total: 3 COWS:  COWS Total Score: 3  Musculoskeletal: Strength & Muscle Tone: within normal limits Gait & Station: normal Patient leans: N/A  Psychiatric Specialty Exam: Physical Exam Vitals and nursing note  reviewed.  Constitutional:      Appearance: Normal appearance.  HENT:     Head: Normocephalic and atraumatic.  Cardiovascular:     Pulses: Normal pulses.  Neurological:     General: No focal deficit present.     Mental Status: She is alert and oriented to person, place, and time.     Review of Systems  Blood pressure 131/89, pulse 85, temperature 97.6 F (36.4 C), temperature source Oral, resp. rate 18, height 5\' 4"  (1.626 m), weight 112 kg, last menstrual period 11/12/2019, SpO2 100 %.Body mass index is 42.4 kg/m.  General Appearance: Casual  Eye Contact:  Good  Speech:  Normal Rate  Volume:  Normal  Mood:  Anxious  Affect:  Congruent  Thought Process:  Coherent and Descriptions of Associations: Intact  Orientation:  Full (Time, Place, and Person)  Thought Content:  Hallucinations: Auditory  Suicidal Thoughts:  No  Homicidal Thoughts:  No  Memory:  Immediate;   Fair Recent;   Fair Remote;   Fair  Judgement:  Intact  Insight:  Fair  Psychomotor Activity:  Increased  Concentration:  Concentration: Fair and Attention Span: Fair  Recall:  [employer] of Knowledge:  Good  Language:  Good  Akathisia:  Negative  Handed:  Right  AIMS (if indicated):     Assets:  Desire for Improvement Housing Resilience Social Support  ADL's:  Intact  Cognition:  WNL  Sleep:  Number of Hours: 8     Treatment Plan Summary: Daily contact with patient to assess and evaluate symptoms and progress in treatment, Medication management and Plan : Patient is seen and examined.  Patient is a 49 year old female with the above-stated past psychiatric history who is seen in follow-up.   Diagnosis: 1.  Schizophrenia versus schizoaffective disorder; bipolar type 2.  Essential tremor 3.  Hypertension 4.  Cannabis use disorder  Pertinent findings on examination today: 1.  Decrease in auditory hallucinations. 2.  Continued essential tremor. 3.  She denied any suicidal or homicidal  ideation.  Plan: 1.  Continue Tegretol-XR 200 mg p.o. daily for mood stability. 2.  Tegretol level in a.m. tomorrow. 3.  Anemia panel given anemia. 4.  Continue hydroxyzine 25 mg p.o. 3 times daily as needed anxiety. 5.  Primidone 50 mg p.o. every 8 hours for tremor. 6.  Patient to continue on Haldol Decanoate 75 mg IM monthly for psychosis. 7.  Continue Artane 2 mg p.o. twice daily for side effects of medications. 8.  Disposition planning-in progress.  Sharma Covert, MD 11/29/2019, 3:13 PM

## 2019-11-29 NOTE — Progress Notes (Signed)
   11/29/19 2000  Psych Admission Type (Psych Patients Only)  Admission Status Voluntary  Psychosocial Assessment  Patient Complaints Anxiety  Eye Contact Fair  Facial Expression Anxious;Pensive  Affect Anxious;Preoccupied  Speech Logical/coherent;Soft  Interaction Assertive  Motor Activity Restless  Appearance/Hygiene Unremarkable  Behavior Characteristics Cooperative  Mood Pleasant;Anxious  Thought Process  Coherency WDL  Content WDL  Delusions None reported or observed  Perception WDL  Hallucination None reported or observed  Judgment Poor  Confusion None  Danger to Self  Current suicidal ideation? Denies  Danger to Others  Danger to Others None reported or observed

## 2019-11-29 NOTE — Progress Notes (Signed)
Provider prescribed one-time dose of Naproxen 500 mg for pt. Pt given medication and more heat packs.

## 2019-11-29 NOTE — Progress Notes (Signed)
Pt seen at nurse's station c/o generalized pain. States this is chronic "I've had it for a while but I never did anything for it. Isn't there a shot you can give me?" Pt told that any order of that kind would need to be reviewed and initiated by the provider during the day. Pt given heat packs and told that we would contact provider. Pt stated that Tylenol wasn't working and she wanted naproxen.
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
## 2019-11-29 NOTE — BHH Suicide Risk Assessment (Signed)
[place] INPATIENT:  Family/Significant Other Suicide Prevention Education  Refusal of Consents:  Suicide Prevention Education:  Patient Refusal for Family/Significant Other Suicide Prevention Education: The patient [name] has refused to provide written consent for family/significant other to be provided Family/Significant Other Suicide Prevention Education during admission and/or prior to discharge.  Physician notified.   SPE completed with patient, as patient refused to consent to family contact. SPI pamphlet provided to pt and pt was encouraged to share information with support network, ask questions, and talk about any concerns relating to SPE. Patient denies access to guns/firearms and verbalized understanding of information provided. Mobile Crisis information also provided to patient.   Darletta Moll MSW, LCSW Clincal Social Worker  [facility]

## 2019-11-29 NOTE — Progress Notes (Signed)
Psychoeducational Group Note  Date:  11/29/2019 Time:  2052  Group Topic/Focus:  Wrap-Up Group:   The focus of this group is to help patients review their daily goal of treatment and discuss progress on daily workbooks.  Participation Level: Did Not Attend  Participation Quality:  Not Applicable  Affect:  Not Applicable  Cognitive:  Not Applicable  Insight:  Not Applicable  Engagement in Group: Not Applicable  Additional Comments:  The patient did not attend group this evening.   Archie Balboa S 11/29/2019, 8:52 PM

## 2019-11-29 NOTE — Progress Notes (Signed)
   11/28/19 2215  Psych Admission Type (Psych Patients Only)  Admission Status Voluntary  Psychosocial Assessment  Patient Complaints Other (Comment) (tiredness)  Eye Contact Fair  Facial Expression Sad  Affect Depressed  Speech Slow  Interaction Assertive  Motor Activity Tremors;Slow  Appearance/Hygiene Unremarkable  Behavior Characteristics Cooperative  Mood Pleasant  Thought Process  Coherency WDL  Content WDL  Delusions None reported or observed  Perception WDL  Hallucination None reported or observed  Judgment Impaired  Confusion None  Danger to Self  Current suicidal ideation? Denies  Danger to Others  Danger to Others None reported or observed

## 2019-11-29 NOTE — Plan of Care (Signed)
Progress note  D: pt found in bed; compliant with medication administration. Pt seems paranoid, guarded with morning assessment. Pt has been viewed in the dayroom interacting appropriately with peers. Pt states they feel they are ready for discharge. Pt states they plan on going back home once they leave. Pt feels this is a good/safe plan. Pt is pleasant. Pt denies si/hi/ah/vh and verbally agrees to approach staff if these become apparent or before harming themself/others while at [facility].  A: Pt provided support and encouragement. Pt given medication per protocol and standing orders. Q15m safety checks implemented and continued.  R: Pt safe on the unit. Will continue to monitor.  Pt progressing in the following metrics  Problem: Education: Goal: Knowledge of [facility] Education information/materials will improve Outcome: Progressing Goal: Emotional status will improve Outcome: Progressing Goal: Mental status will improve Outcome: Progressing Goal: Verbalization of understanding the information provided will improve Outcome: Progressing

## 2019-11-29 NOTE — BHH Group Notes (Signed)
Occupational Therapy Group Note Date: 11/29/2019 Group Topic/Focus: Socialization/Social Skills  Group Description: Group encouraged increased engagement and participation through discussion/activity focused on increasing communication and age-appropriate social skills. Patients were asked to toss a foam dice around and pick an index card that corresponded with the number rolled. Each card contained a leisure topic to engage in discussion about OR an active movement/exercise or stretch to engage in. Purpose of group identified as increasing age appropriate social skills and engaging in discussion focused on leisure interests, hobbies, and preferences.  Participation Level: Active   Participation Quality: Independent   Behavior: Cooperative and Interactive   Speech/Thought Process: Focused and Loud   Affect/Mood: Full range   Insight: Fair   Judgement: Fair   Individualization: [name] was active and independent in her participation of discussion and activity. She was loud, though interactive and appropriate, laughing and joking with peers and this Probation officer. Pt identified her dream job as "anything to work alongside [employer] and a Arts administrator "peach." Remained for duration.   Modes of Intervention: Activity, Discussion, Education and Socialization  Patient Response to Interventions:  Attentive, Engaged, Receptive and Interested   Plan: Continue to engage patient in OT groups 2 - 3x/week.  Ponciano Ort, MOT, OTR/L

## 2019-11-29 NOTE — Tx Team (Signed)
Interdisciplinary Treatment and Diagnostic Plan Update  11/29/2019 Time of Session: 9:40am [name] MRN: [id number]  Principal Diagnosis: <principal problem not specified>  Secondary Diagnoses: Active Problems:   Schizoaffective disorder, bipolar type (HCC)   Abnormal involuntary movement   Hypertension   Schizo affective schizophrenia ([place])   Tobacco use disorder   Cannabis use disorder, moderate, dependence (HCC)   Disturbance in affect   Abnormal behavior   Psychosis ([place])   Schizoaffective disorder (HCC)   Schizophrenia (HCC)   Current Medications:  Current Facility-Administered Medications  Medication Dose Route Frequency Provider Last Rate Last Admin   acetaminophen (TYLENOL) tablet 650 mg  650 mg Oral Q6H PRN Sharma Covert, MD   650 mg at 11/29/19 0904   alum & mag hydroxide-simeth (MAALOX/MYLANTA) 200-200-20 MG/5ML suspension 30 mL  30 mL Oral Q4H PRN Caroline Sauger, NP       carbamazepine (TEGRETOL XR) 12 hr tablet 200 mg  200 mg Oral Daily Sharma Covert, MD   200 mg at 11/29/19 0759   haloperidol (HALDOL) tablet 5 mg  5 mg Oral Q8H PRN Sharma Covert, MD   5 mg at 11/29/19 1152   Or   haloperidol lactate (HALDOL) injection 5 mg  5 mg Intramuscular Q8H PRN Sharma Covert, MD       hydrOXYzine (ATARAX/VISTARIL) tablet 25 mg  25 mg Oral TID PRN Sharma Covert, MD   25 mg at 11/29/19 1152   magnesium hydroxide (MILK OF MAGNESIA) suspension 30 mL  30 mL Oral Daily PRN Caroline Sauger, NP       nicotine polacrilex (NICORETTE) gum 2 mg  2 mg Oral PRN Caroline Sauger, NP   2 mg at 11/29/19 3785   primidone (MYSOLINE) tablet 50 mg  50 mg Oral Q8H Sharma Covert, MD   50 mg at 11/29/19 1352   traZODone (DESYREL) tablet 50 mg  50 mg Oral QHS PRN Sharma Covert, MD   50 mg at 11/28/19 2351   trihexyphenidyl (ARTANE) tablet 2 mg  2 mg Oral BID WC Sharma Covert, MD   2 mg at 11/29/19 0759   PTA  Medications: Facility-Administered Medications Prior to Admission  Medication Dose Route Frequency Provider Last Rate Last Admin   [DISCONTINUED] haloperidol decanoate (HALDOL DECANOATE) 50 MG/ML injection 75 mg  75 mg Intramuscular Q30 days [name], MD       Medications Prior to Admission  Medication Sig Dispense Refill Last Dose   bethanechol (URECHOLINE) 5 MG tablet Take 5 mg by mouth daily.   Past Month at Unknown time   haloperidol decanoate (HALDOL DECANOATE) 100 MG/ML injection Inject 0.75 mLs (75 mg total) into the muscle once for 1 dose. (Patient taking differently: Inject 75 mg into the muscle every 28 (twenty-eight) days. ) 1 mL 11 Past Month at Unknown time    Patient Stressors: Health problems Medication change or noncompliance Other: upstairs neighbor  Patient Strengths: Technical sales engineer for treatment/growth  Treatment Modalities: Medication Management, Group therapy, Case management,  1 to 1 session with clinician, Psychoeducation, Recreational therapy.   Physician Treatment Plan for Primary Diagnosis: <principal problem not specified> Long Term Goal(s): Improvement in symptoms so as ready for discharge Improvement in symptoms so as ready for discharge   Short Term Goals: Ability to identify changes in lifestyle to reduce recurrence of condition will improve Ability to verbalize feelings will improve Ability to demonstrate self-control will improve Ability to identify and develop effective  coping behaviors will improve Ability to maintain clinical measurements within normal limits will improve Compliance with prescribed medications will improve Ability to identify triggers associated with substance abuse/mental health issues will improve Ability to identify changes in lifestyle to reduce recurrence of condition will improve Ability to verbalize feelings will improve Ability to demonstrate self-control will improve Ability to identify and  develop effective coping behaviors will improve Ability to maintain clinical measurements within normal limits will improve Compliance with prescribed medications will improve Ability to identify triggers associated with substance abuse/mental health issues will improve  Medication Management: Evaluate patient's response, side effects, and tolerance of medication regimen.  Therapeutic Interventions: 1 to 1 sessions, Unit Group sessions and Medication administration.  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[place])   Tobacco use disorder   Cannabis use disorder, moderate, dependence (HCC)   Disturbance in affect   Abnormal behavior   Psychosis ([place])   Schizoaffective disorder ([place])   Schizophrenia ([place])  Long Term Goal(s): Improvement in symptoms so as ready for discharge Improvement in symptoms so as ready for discharge   Short Term Goals: Ability to identify changes in lifestyle to reduce recurrence of condition will improve Ability to verbalize feelings will improve Ability to demonstrate self-control will improve Ability to identify and develop effective coping behaviors will improve Ability to maintain clinical measurements within normal limits will improve Compliance with prescribed medications will improve Ability to identify triggers associated with substance abuse/mental health issues will improve Ability to identify changes in lifestyle to reduce recurrence of condition will improve Ability to verbalize feelings will improve Ability to demonstrate self-control will improve Ability to identify and develop effective coping behaviors will improve Ability to maintain clinical measurements within normal limits will improve Compliance with prescribed medications will improve Ability to  identify triggers associated with substance abuse/mental health issues will improve     Medication Management: Evaluate patient's response, side effects, and tolerance of medication regimen.  Therapeutic Interventions: 1 to 1 sessions, Unit Group sessions and Medication administration.  Evaluation of Outcomes: Progressing   RN Treatment Plan for Primary Diagnosis: <principal problem not specified> Long Term Goal(s): Knowledge of disease and therapeutic regimen to maintain health will improve  Short Term Goals: Ability to remain free from injury will improve, Ability to verbalize frustration and anger appropriately will improve, Ability to identify and develop effective coping behaviors will improve and Compliance with prescribed medications will improve  Medication Management: RN will administer medications as ordered by provider, will assess and evaluate patient's response and provide education to patient for prescribed medication. RN will report any adverse and/or side effects to prescribing provider.  Therapeutic Interventions: 1 on 1 counseling sessions, Psychoeducation, Medication administration, Evaluate responses to treatment, Monitor vital signs and CBGs as ordered, Perform/monitor CIWA, COWS, AIMS and Fall Risk screenings as ordered, Perform wound care treatments as ordered.  Evaluation of Outcomes: Progressing   LCSW Treatment Plan for Primary Diagnosis: <principal problem not specified> Long Term Goal(s): Safe transition to appropriate next level of care at discharge, Engage patient in therapeutic group addressing interpersonal concerns.  Short Term Goals: Engage patient in aftercare planning with referrals and resources, Increase social support, Identify triggers associated with mental health/substance abuse issues and Increase skills for wellness and recovery  Therapeutic Interventions: Assess for all discharge needs, 1 to 1 time with Education officer, museum,  Explore available resources  and support systems, Assess for adequacy in community support network, Educate family and significant other(s) on suicide prevention, Complete Psychosocial Assessment, Interpersonal group therapy.  Evaluation of Outcomes: Progressing   Progress in Treatment: Attending groups: No. Participating in groups: No. Taking medication as prescribed: Yes. Toleration medication: Yes. Family/Significant other contact made: No, will contact:  if consent is provided. Patient understands diagnosis: Yes. Discussing patient identified problems/goals with staff: No. Medical problems stabilized or resolved: Yes. Denies suicidal/homicidal ideation: Yes. Issues/concerns per patient self-inventory: No.   New problem(s) identified: No, Describe:  none.  New Short Term/Long Term Goal(s): medication stabilization, elimination of SI thoughts, development of comprehensive mental wellness plan.   Patient Goals:  Patient did not attend.  Discharge Plan or Barriers:   Reason for Continuation of Hospitalization: Delusions  Depression Medication stabilization  Estimated Length of Stay: 3-5 days  Attendees: Patient: Patient did not attend 11/29/2019   Physician: Myles Lipps, MD 11/29/2019   Nursing:  11/29/2019   RN Care Manager: 11/29/2019   Social Worker: Darletta Moll, LCSW 11/29/2019   Recreational Therapist:  11/29/2019   Other:  11/29/2019   Other:  11/29/2019   Other: 11/29/2019      Scribe for Treatment Team: Vassie Moselle, LCSW 11/29/2019 2:40 PM

## 2019-11-29 NOTE — Progress Notes (Signed)
Psychoeducational Group Note  Date:  11/29/2019 Time:  0252  Group Topic/Focus:  Wrap-Up Group:   The focus of this group is to help patients review their daily goal of treatment and discuss progress on daily workbooks.  Participation Level: Did Not Attend  Participation Quality:  Not Applicable  Affect:  Not Applicable  Cognitive:  Not Applicable  Insight:  Not Applicable  Engagement in Group: Not Applicable  Additional Comments:  The patient did not attend group last evening.   Cyla Haluska S 11/29/2019, 2:51 AM

## 2019-11-30 LAB — HEPATIC FUNCTION PANEL
ALT: 9 U/L (ref 0–44)
AST: 17 U/L (ref 15–41)
Albumin: 4.2 g/dL (ref 3.5–5.0)
Alkaline Phosphatase: 86 U/L (ref 38–126)
Bilirubin, Direct: <0.1 mg/dL (ref 0.0–0.2)
Total Bilirubin: 0.3 mg/dL (ref 0.3–1.2)
Total Protein: 7.6 g/dL (ref 6.5–8.1)

## 2019-11-30 LAB — IRON AND TIBC
Iron: 28 ug/dL (ref 28–170)
Saturation Ratios: 7 % — ABNORMAL LOW (ref 10.4–31.8)
TIBC: 413 ug/dL (ref 250–450)
UIBC: 385 ug/dL

## 2019-11-30 LAB — FERRITIN: Ferritin: 12 ng/mL (ref 11–307)

## 2019-11-30 LAB — RETICULOCYTES
Immature Retic Fract: 20.9 % — ABNORMAL HIGH (ref 2.3–15.9)
RBC.: 3.58 MIL/uL — ABNORMAL LOW (ref 3.87–5.11)
Retic Count, Absolute: 44.4 10*3/uL (ref 19.0–186.0)
Retic Ct Pct: 1.2 % (ref 0.4–3.1)

## 2019-11-30 LAB — FOLATE: Folate: 5.9 ng/mL — ABNORMAL LOW (ref >5.9)

## 2019-11-30 MED ORDER — FAMOTIDINE 40 MG PO TABS
40.0000 mg | ORAL_TABLET | Freq: Once | ORAL | Status: AC
  Administered 2019-11-30: 40 mg via ORAL
  Filled 2019-11-30: qty 1

## 2019-11-30 MED ORDER — TRAZODONE HCL 50 MG PO TABS: 50.0000 mg | ORAL_TABLET | Freq: Every evening | ORAL | 0 refills | Status: DC | PRN

## 2019-11-30 MED ORDER — CARBAMAZEPINE ER 200 MG PO TB12: 200.0000 mg | ORAL_TABLET | Freq: Every day | ORAL | 0 refills | Status: DC

## 2019-11-30 MED ORDER — NICOTINE POLACRILEX 2 MG MT GUM: 2.0000 mg | CHEWING_GUM | OROMUCOSAL | 0 refills | Status: DC | PRN

## 2019-11-30 MED ORDER — PANTOPRAZOLE SODIUM 40 MG PO TBEC
40.0000 mg | DELAYED_RELEASE_TABLET | Freq: Every day | ORAL | Status: DC
  Administered 2019-11-30: 40 mg via ORAL
  Filled 2019-11-30 (×2): qty 1

## 2019-11-30 MED ORDER — HYDROXYZINE HCL 25 MG PO TABS: 25.0000 mg | ORAL_TABLET | Freq: Three times a day (TID) | ORAL | 0 refills | Status: DC | PRN

## 2019-11-30 MED ORDER — TRIHEXYPHENIDYL HCL 2 MG PO TABS: 2.0000 mg | ORAL_TABLET | Freq: Two times a day (BID) | ORAL | 0 refills | Status: DC

## 2019-11-30 NOTE — BHH Suicide Risk Assessment (Signed)
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  All other systems reviewed and are negative.   Blood pressure 124/74, pulse 77, temperature 98.2 F (36.8 C), temperature source Oral, resp. rate 18, height 5\' 4"  (1.626 m), weight 112 kg, last menstrual period 11/12/2019, SpO2 100 %.Body mass index is 42.4 kg/m.  General Appearance: Casual  Eye Contact::  Fair  Speech:  Normal Rate409  Volume:  Normal  Mood:  Euthymic  Affect:  Congruent  Thought Process:  Coherent and Descriptions of Associations: Intact  Orientation:  Full (Time, Place, and Person)  Thought Content:  Logical  Suicidal Thoughts:  No  Homicidal Thoughts:  No  Memory:  Immediate;   Fair Recent;   Fair Remote;   Fair  Judgement:  Intact  Insight:  Fair  Psychomotor Activity:  Normal  Concentration:  Good  Recall:  [place] of Knowledge:Good  Language: Good  Akathisia:  Negative  Handed:  Right  AIMS (if indicated):     Assets:  Desire for Improvement Resilience Social Support  Sleep:  Number of Hours: 6  Cognition: WNL  ADL's:  Intact   Mental Status Per Nursing Assessment::   On Admission:  Suicidal ideation indicated by patient  Demographic Factors:  Low socioeconomic status  Loss Factors: Decline in physical health  Historical  Factors: Impulsivity  Risk Reduction Factors:   Positive social support  Continued Clinical Symptoms:  Schizophrenia:   Paranoid or undifferentiated type  Cognitive Features That Contribute To Risk:  None    Suicide Risk:  Minimal: No identifiable suicidal ideation.  Patients presenting with no risk factors but with morbid ruminations; may be classified as minimal risk based on the severity of the depressive symptoms    Plan Of Care/Follow-up recommendations:  Activity:  ad lib  Sharma Covert, MD 11/30/2019, 7:31 AM

## 2019-11-30 NOTE — Progress Notes (Signed)
  [facility] Adult Case Management Discharge Plan :  Will you be returning to the same living situation after discharge:  Yes,  to home. At discharge, do you have transportation home?: No. Safe Transport will be arranged.  Do you have the ability to pay for your medications: No. Samples to be provided at discharge.  Release of information consent forms completed and in the chart;  Patient's signature needed at discharge.  Patient to Follow up at:  [facility]. Go to.   Specialty: Urgent Care Why: Walk-in appointments made for therapy and medication management. Walk-in on Thursday 12/06/19 at 7:50am for therapy. Walk-in on Monday 12/31/19 at 1pm for medication management.  Contact information: [place]. Go on 12/07/2019.   Why: An appointment has been made for you on Friday 12/07/19 at 11:45am with you primary care provider.  Contact information: [place], [place], [place] [address]  [phone]              Next level of care provider has access to [place] and Suicide Prevention discussed: Yes,  with patient.  Have you used any form of tobacco in the last 30 days? (Cigarettes, Smokeless Tobacco, Cigars, and/or Pipes): Yes  Has patient been referred to the Quitline?: Patient refused referral  Patient has been referred for addiction treatment: Pt. refused referral  Vassie Moselle, LCSW 11/30/2019, 9:53 AM

## 2019-11-30 NOTE — Progress Notes (Signed)
Pt discharged to lobby. Pt was stable and appreciative at that time. All papers, samples and prescriptions were given and valuables returned. Verbal understanding expressed. Denies SI/HI and A/VH. Pt given opportunity to express concerns and ask questions.  

## 2019-11-30 NOTE — Discharge Summary (Signed)
Physician Discharge Summary Note  Patient:  [name] is an 50 y.o., female MRN:  [id number] DOB:  05/03/1970 Patient phone:  [phone] (home)  Patient address:   [phone] [place] [address],  Total Time spent with patient: Greater than 30 minutes  Date of Admission:  11/28/2019  Date of Discharge: 11-30-19  Reason for Admission: Worsening hallucinations.  Principal Problem: Schizoaffective disorder, bipolar type [facility])  Discharge Diagnoses: Principal Problem:   Schizoaffective disorder, bipolar type ([place]) Active Problems:   Abnormal involuntary movement   Hypertension   Schizo affective schizophrenia ([place])   Tobacco use disorder   Cannabis use disorder, moderate, dependence (HCC)   Disturbance in affect   Abnormal behavior   Psychosis ([place])   Schizoaffective disorder ([place])   Schizophrenia ([place])  Past Psychiatric History: Extensive generally treatment resistant:                                              Schizoaffective disorder.                                             Cannabis use disorder.  Past Medical History:  Past Medical History:  Diagnosis Date  . Arthritis   . Bipolar 1 disorder ([place])   . Bronchitis   . Depression   . Hemorrhoid   . Schizoaffective disorder ([place])   . Schizophrenia ([place])    Pt denies and reports it is schizoaffective disorder.   . Transfusion history     Past Surgical History:  Procedure Laterality Date  . CESAREAN SECTION     Family History:  Family History  Problem Relation Age of Onset  . Diabetes type II Other    Family Psychiatric  History: See H&P  Social History:  Social History   Substance and Sexual Activity  Alcohol Use No     Social History   Substance and Sexual Activity  Drug Use No  . Types: Marijuana   Comment: Former user    Social History   Socioeconomic History  . Marital status: Legally Separated    Spouse name: Not on file  . Number of children: Not on file  . Years  of education: Not on file  . Highest education level: Not on file  Occupational History  . Not on file  Tobacco Use  . Smoking status: Current Every Day Smoker    Packs/day: 0.50    Years: 17.00    Pack years: 8.50    Types: Cigarettes  . Smokeless tobacco: Never Used  Vaping Use  . Vaping Use: Never used  Substance and Sexual Activity  . Alcohol use: No  . Drug use: No    Types: Marijuana    Comment: Former user  . Sexual activity: Not Currently  Other Topics Concern  . Not on file  Social History Narrative   ** Merged History Encounter **       Social Determinants of Health   Financial Resource Strain:   . Difficulty of Paying Living Expenses:   Food Insecurity:   . Worried About Charity fundraiser in the Last Year:   . [place] in the Last Year:   Transportation Needs:   . Lack of  Transportation (Medical):   Marland Kitchen Lack of Transportation (Non-Medical):   Physical Activity:   . Days of Exercise per Week:   . Minutes of Exercise per Session:   Stress:   . Feeling of Stress :   Social Connections:   . Frequency of Communication with Friends and Family:   . Frequency of Social Gatherings with Friends and Family:   . Attends Religious Services:   . Active Member of Clubs or Organizations:   . Attends Archivist Meetings:   Marland Kitchen Marital Status:    Hospital Course: (Per Md's admission evaluation notes): Patient is seen and examined. Patient is a 49 year old female with a reported past psychiatric history significant for schizoaffective disorder versus schizophrenia. She was brought to the behavioral health urgent care center by law enforcement voluntarily due to experiencing visual and auditory hallucinations. She stated that she sees small animals in the woods, and hears voices giving her commands to hurt her self and to hurt people. She stated that she had recently been diagnosed with tardive dyskinesia. (From a superficial evaluation today it looks more  parkinsonian than it does tardive). She was diagnosed with tardive dyskinesia by her primary care provider recently had a urinary tract infection. She stated that the doctor did not give her any antibiotics for UTI, but gave her diuretics. She is also a patient have been asked service, and patient received her long-acting Haldol injection 1 to 2 days ago. Pharmacy information obtained revealed that her Haldol Decanoate injection is 75 mg IM monthly. She apparently received this 1 to 2 days ago. She has also been given other medications, but has not picked them off. The patient stated that she has been given "20 pills". She does not want to take pills, she does not want to take medications. She stated that the Cogentin did not help her tremor, and she wanted to be placed on Ingrezza. She denied suicidal or homicidal ideation. She is also apparently prescribed Cogentin, Tegretol extended release, Risperdal and trazodone. She was admitted to the hospital for evaluation and stabilization.  This is one of several psychiatric discharge summaries for this 49year old [name] with hx of chronic mental illness, THC use disorder & multiple psychiatric admissions. She is known in this [facility] & other psychiatric hospitals within the surrounding areas for worsening symptoms of her mental illness. She has been tried on multiple psychotropic medications for her symptoms & it appears nothing has actually been helpful in stabilizing her symptoms. She was brought to the [facility] this time around for evaluation & treatment for worsening hallucinations.  After evaluation of her presenting symptoms, [name] was recommended for mood stabilization treatments. The medication regimen for her presenting symptoms were discussed & with her consent initiated. She received, stabilized & was discharged on the medications as listed below on her discharge medication lists. She was also enrolled & participated in the group counseling  sessions being offered & held on this unit. She learned coping skills. Shepresented with on this admission, other chronic medical conditions that required treatment & monitoring. She was resumed/discharged on all her pertinent home medications for those health issues. She tolerated her treatment regimen without any adverse effects or reactions reported.  During the course of her hospitalization, the 15-minute checks were adequate to ensure [name]'s safety.  Patient did not display any dangerous, violent or suicidal behavior on the unit.  She interacted with patients & staff appropriately, She participated appropriately in the group sessions/therapies. Her medications were addressed &  adjusted to meet her needs. She was recommended for outpatient follow-up care & medication management upon discharge to assure her continuity of care.  At the time of discharge patient is not reporting any acute suicidal/homicidal ideations. She denies any any auditory/visual hallucinations. She denies any delusional thoughts or paranoia. She does not appear to be responding to any internal stimuli. She feels more confident about her self-care & in managing her mental health symptoms. She currently denies any new issues or concerns. Education and supportive counseling provided throughout her hospital stay & upon discharge.  Today upon her discharge evaluation with the attending psychiatrist, [name] shares she is doing well. She denies any other specific concerns. She is sleeping well. Her appetite is good. She denies other physical complaints. She denies AH/VH. She feels that her medications have been helpful & is in agreement to continue her current treatment regimen as recommended. She was able to engage in safety planning including plan to return to [facility] or contact emergency services if she feels unable to maintain her own safety or the safety of others. Pt had no further questions, comments, or concerns. She left [facility] with all  personal belongings in no apparent distress. Transportation per the [employer].  Physical Findings: AIMS: Facial and Oral Movements Muscles of Facial Expression: Minimal Lips and Perioral Area: Minimal Jaw: None, normal Tongue: None, normal,Extremity Movements Upper (arms, wrists, hands, fingers): Minimal Lower (legs, knees, ankles, toes): None, normal, Trunk Movements Neck, shoulders, hips: None, normal, Overall Severity Severity of abnormal movements (highest score from questions above): Minimal Incapacitation due to abnormal movements: None, normal Patient's awareness of abnormal movements (rate only patient's report): Aware, no distress, Dental Status Current problems with teeth and/or dentures?: No Does patient usually wear dentures?: No  CIWA:  CIWA-Ar Total: 3 COWS:  COWS Total Score: 3  Musculoskeletal: Strength & Muscle Tone: within normal limits Gait & Station: normal Patient leans: N/A  Psychiatric Specialty Exam: Physical Exam Vitals and nursing note reviewed.  HENT:     Head: Normocephalic.     Nose: Nose normal.     Mouth/Throat:     Pharynx: Oropharynx is clear.  Eyes:     Pupils: Pupils are equal, round, and reactive to light.  Cardiovascular:     Rate and Rhythm: Normal rate.     Pulses: Normal pulses.  Pulmonary:     Effort: Pulmonary effort is normal.  Genitourinary:    Comments: Deferred Musculoskeletal:        General: Normal range of motion.     Cervical back: Normal range of motion.  Skin:    General: Skin is warm and dry.  Neurological:     Mental Status: She is alert and oriented to person, place, and time.     Review of Systems  Constitutional: Negative for chills, diaphoresis and fever.  HENT: Negative for congestion and sore throat.   Eyes: Negative for blurred vision.  Respiratory: Negative for cough, shortness of breath and wheezing.   Cardiovascular: Negative for chest pain and palpitations.  Gastrointestinal:  Negative for diarrhea, heartburn, nausea and vomiting.  Genitourinary: Negative for dysuria.  Musculoskeletal: Negative for myalgias.  Skin: Negative.   Neurological: Negative for dizziness, tingling, tremors, speech change, focal weakness, seizures, weakness and headaches.  Endo/Heme/Allergies: Negative for environmental allergies. Does not bruise/bleed easily.       Allergies:  Abilify,  Flagyl,  Prolixin,  Risperdal,  Seroquel,  Sulfa drugs.  Psychiatric/Behavioral: Positive for depression (Stabilized with medication prior to  discharge), hallucinations (Hx. Psychosis (Stabilized with medication prior to discharge) and substance abuse (Hx. THC use disorder). Negative for memory loss and suicidal ideas. The patient has insomnia (Stabilized with medication prior to discharge). The patient is not nervous/anxious (Stable upon discharge).     Blood pressure 124/74, pulse 77, temperature 98.2 F (36.8 C), temperature source Oral, resp. rate 18, height 5\' 4"  (1.626 m), weight 112 kg, last menstrual period 11/12/2019, SpO2 100 %.Body mass index is 42.4 kg/m.  See Md's discharge SRA  Sleep:  Number of Hours: 6   Have you used any form of tobacco in the last 30 days? (Cigarettes, Smokeless Tobacco, Cigars, and/or Pipes): Yes  Has this patient used any form of tobacco in the last 30 days? (Cigarettes, Smokeless Tobacco, Cigars, and/or Pipes): No  Blood Alcohol level:  Lab Results  Component Value Date   ETH <10 11/27/2019   ETH <10 19/37/9024   Metabolic Disorder Labs:  Lab Results  Component Value Date   HGBA1C 6.3 (H) 01/30/2017   MPG 134.11 01/30/2017   MPG 128 05/11/2015   No results found for: PROLACTIN Lab Results  Component Value Date   CHOL 193 11/27/2019   TRIG 75 11/27/2019   HDL 54 11/27/2019   CHOLHDL 3.6 11/27/2019   VLDL 15 11/27/2019   LDLCALC 124 (H) 11/27/2019   [place] 93 01/29/2017   See Psychiatric Specialty Exam and Suicide Risk Assessment completed by  Attending Physician prior to discharge.  Discharge destination:  Home  Is patient on multiple antipsychotic therapies at discharge:  No   Has Patient had three or more failed trials of antipsychotic monotherapy by history:  No  Recommended Plan for Multiple Antipsychotic Therapies: NA  Allergies as of 11/30/2019      Reactions   Latuda [lurasidone Hcl] Nausea And Vomiting, Other (See Comments)   Reaction:  Shaking    Abilify [aripiprazole] Other (See Comments)   shaking   Flagyl [metronidazole Hcl] Nausea And Vomiting   Prolixin [fluphenazine] Other (See Comments)   shaking   Risperidone And Related Other (See Comments)   shaking   Seroquel [quetiapine Fumarate]    Patient states she had a syncopal episode after taking medication.    Sulfa Antibiotics Nausea And Vomiting      Medication List    STOP taking these medications   bethanechol 5 MG tablet Commonly known as: URECHOLINE   haloperidol decanoate 100 MG/ML injection Commonly known as: HALDOL DECANOATE     TAKE these medications     Indication  carbamazepine 200 MG 12 hr tablet Commonly known as: TEGRETOL XR Take 1 tablet (200 mg total) by mouth daily. For mood stabilization Start taking on: December 01, 2019  Indication: Mood stabilization   hydrOXYzine 25 MG tablet Commonly known as: ATARAX/VISTARIL Take 1 tablet (25 mg total) by mouth 3 (three) times daily as needed for anxiety.  Indication: Feeling Anxious   nicotine polacrilex 2 MG gum Commonly known as: NICORETTE Take 1 each (2 mg total) by mouth as needed. (May buy from over the counter): For smoking cessation  Indication: Nicotine Addiction   traZODone 50 MG tablet Commonly known as: DESYREL Take 1 tablet (50 mg total) by mouth at bedtime as needed for sleep.  Indication: Major Depressive Disorder   trihexyphenidyl 2 MG tablet Commonly known as: ARTANE Take 1 tablet (2 mg total) by mouth 2 (two) times daily with a meal. For prevention drug  induced tremors.  Indication: Extrapyramidal Reaction caused by Medications  [place]. Go to.   Specialty: Urgent Care Why: Walk-in appointments made for therapy and medication management. Walk-in on Thursday 12/06/19 at 7:50am for therapy. Walk-in on Monday 12/31/19 at 1pm for medication management.  Contact information: [place]. Go on 12/07/2019.   Why: An appointment has been made for you on Friday 12/07/19 at 11:45am with you primary care provider.  Contact information: [address] [place], [place] [address]  [phone]             Follow-up recommendations: Activity:  As tolerated Diet: As recommended by your primary care doctor. Keep all scheduled follow-up appointments as recommended.  Comments: Prescriptions given at discharge.  Patient agreeable to plan.  Given opportunity to ask questions.  Appears to feel comfortable with discharge denies any current suicidal or homicidal thought. Patient is also instructed prior to discharge to: Take all medications as prescribed by his/her mental healthcare provider. Report any adverse effects and or reactions from the medicines to his/her outpatient provider promptly. Patient has been instructed & cautioned: To not engage in alcohol and or illegal drug use while on prescription medicines. In the event of worsening symptoms, patient is instructed to call the crisis hotline, 911 and or go to the nearest ED for appropriate evaluation and treatment of symptoms. To follow-up with his/her primary care provider for your other medical issues, concerns and or health care needs.  Signed: Lindell Spar, NP, PMHNP, FNP-BC 11/30/2019, 10:53 AM

## 2019-12-10 ENCOUNTER — Ambulatory Visit (HOSPITAL_COMMUNITY)
Admission: EM | Admit: 2019-12-10 | Discharge: 2019-12-10 | Disposition: A | Discharge status: Home / Self Care | Payer: Medicaid Other | Attending: Psychiatry | Admitting: Psychiatry

## 2019-12-10 ENCOUNTER — Other Ambulatory Visit: Payer: Self-pay

## 2019-12-10 DIAGNOSIS — F200 Paranoid schizophrenia: Secondary | ICD-10-CM

## 2019-12-10 DIAGNOSIS — F209 Schizophrenia, unspecified: Secondary | ICD-10-CM | POA: Insufficient documentation

## 2019-12-10 MED ORDER — HALOPERIDOL 10 MG PO TABS
10.0000 mg | ORAL_TABLET | Freq: Every day | ORAL | 0 refills | Status: DC
Start: 2019-12-10 — End: 2020-03-12

## 2019-12-10 MED ORDER — HALOPERIDOL 5 MG PO TABS
5.0000 mg | ORAL_TABLET | Freq: Every day | ORAL | 0 refills | Status: DC
Start: 2019-12-10 — End: 2019-12-10

## 2019-12-10 MED ORDER — HALOPERIDOL 10 MG PO TABS
10.0000 mg | ORAL_TABLET | Freq: Every day | ORAL | 0 refills | Status: DC
Start: 2019-12-10 — End: 2019-12-10

## 2019-12-10 NOTE — ED Provider Notes (Signed)
Behavioral Health Medical Screening Exam  [name] is a 49 y.o. female.  Patient presents to the [facility] as a walk-in voluntarily.  Patient reports that since she has left the hospital on 11/30/2019 that she has been having some paranoia and feels like she can hear her neighbor upstairs talking to her.  She states that this has bothered her enough that she has been taking a broom and hit it against the floor has also went upstairs and had arguments with her neighbor.  She states that she is realizing that it may not be her neighbor and that this may be some her paranoia and hallucinations that she has had due to her being diagnosed with schizophrenia.  When I speak with the patient she reports that she has continued to take her Tegretol XL 200 mg daily and her Artane 2 mg p.o. twice daily.  She reports that [facility] was with [facility] but that she does not plan on following them anymore but received a Haldol Decanoate injection approximately July 20 something.  Contacted [facility] and found that patient actually received her Haldol Decanoate injection on 11/22/2019 just prior to going to the hospital.  Patient reporting that she feels that she needs to have additional Haldol added to her regimen because she is continually to have these paranoid thoughts.  She states that she does plan on coming to the [facility] for her follow-up appointments and does not plan to follow-up with [facility].  Based off the chart review patient was receiving Haldol decanoate 75 mg IM every 30 days.  We will recommend the patient continue her current home medications and will also provide her with a prescription for Haldol 10 mg p.o. nightly to assist with the hallucinations to assist with sleep and paranoia. Spoke with Dr. [name] with [facility] and the patient is on Haldol Dec 100 mg IM Q30 days and feels that the patient could use an additional 10 mg QHS and he will  adjust her injection accordingly for the next visit.  At this time patient does not meet inpatient psychiatric treatment criteria.  Patient has continually denied any suicidal or homicidal ideations and denies any hallucinations.  Total Time spent with patient: 45 minutes  Psychiatric Specialty Exam  Presentation  General Appearance:Appropriate for Environment;Casual  Eye Contact:Good  Speech:Clear and Coherent;Normal Rate  Speech Volume:Normal  Handedness:Right   Mood and Affect  Mood:Anxious  Affect:Appropriate;Congruent   Thought Process  Thought Processes:Coherent  Descriptions of Associations:Intact  Orientation:Full (Time, Place and Person)  Thought Content:WDL  Hallucinations:None The voiced is telling her to do things and she see's small animals in between the cracks of the door. The voice (1) telling her to harm herself and others. She sees small animals in the line on the door  Ideas of Reference:Paranoia  Suicidal Thoughts:No  Homicidal Thoughts:No   Sensorium  Memory:Immediate Good;Recent Good;Remote Good  Judgment:Fair  Insight:Fair   Executive Functions  Concentration:Good  Attention Span:Good  [place] of Knowledge:Good  Language:Good   Psychomotor Activity  Psychomotor Activity:Normal Tardive Dyskinesia No   Assets  Assets:Communication Skills;Desire for Improvement;Financial Resources/Insurance;Housing;Social Support;Transportation   Sleep  Sleep:Good  Number of hours: No data recorded  Physical Exam: Physical Exam Vitals and nursing note reviewed.  Constitutional:      Appearance: She is well-developed.  Cardiovascular:     Rate and Rhythm: Normal rate.  Pulmonary:     Effort: Pulmonary effort is normal.  Musculoskeletal:  General: Normal range of motion.  Skin:    General: Skin is warm.  Neurological:     Mental Status: She is alert and oriented to person, place, and time.    Review of  Systems  Constitutional: Negative.   HENT: Negative.   Eyes: Negative.   Respiratory: Negative.   Cardiovascular: Negative.   Gastrointestinal: Negative.   Genitourinary: Negative.   Musculoskeletal: Negative.   Skin: Negative.   Neurological: Negative.   Endo/Heme/Allergies: Negative.   Psychiatric/Behavioral:       Reports some paranoia at home    Blood pressure 127/62, pulse 89, temperature 97.8 F (36.6 C), temperature source Temporal, resp. rate 18, height 5\' 4"  (1.626 m), weight 115.7 kg, last menstrual period 11/12/2019, SpO2 99 %. Body mass index is 43.77 kg/m.  Musculoskeletal: Strength & Muscle Tone: within normal limits Gait & Station: normal Patient leans: N/A   Recommendations:  Based on my evaluation the patient does not appear to have an emergency medical condition.  [place], FNP 12/10/2019, 3:21 PM

## 2019-12-10 NOTE — ED Notes (Signed)
Patient belongings in locker #19

## 2019-12-10 NOTE — Discharge Instructions (Addendum)
[place]. Go to.   Specialty: Urgent Care Why: Walk-in appointments made for therapy and medication management. Walk-in on Thursday 12/06/19 at 7:50am for therapy. Walk-in on Monday 12/31/19 at 1pm for medication management.  Contact information: [place]
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
## 2020-01-03 ENCOUNTER — Encounter (HOSPITAL_COMMUNITY): Payer: Self-pay | Admitting: Emergency Medicine

## 2020-01-03 ENCOUNTER — Emergency Department (HOSPITAL_COMMUNITY)
Admission: EM | Admit: 2020-01-03 | Discharge: 2020-01-03 | Disposition: A | Discharge status: Home / Self Care | Payer: Medicaid Other | Attending: Emergency Medicine | Admitting: Emergency Medicine

## 2020-01-03 DIAGNOSIS — Z5321 Procedure and treatment not carried out due to patient leaving prior to being seen by health care provider: Secondary | ICD-10-CM | POA: Diagnosis not present

## 2020-01-03 DIAGNOSIS — R251 Tremor, unspecified: Secondary | ICD-10-CM | POA: Diagnosis not present

## 2020-01-03 LAB — URINALYSIS, ROUTINE W REFLEX MICROSCOPIC
Bilirubin Urine: NEGATIVE
Glucose, UA: NEGATIVE mg/dL
Hgb urine dipstick: NEGATIVE
Ketones, ur: NEGATIVE mg/dL
Nitrite: NEGATIVE
Protein, ur: 30 mg/dL — AB
Specific Gravity, Urine: 1.025 (ref 1.005–1.030)
pH: 5 (ref 5.0–8.0)

## 2020-01-03 NOTE — ED Notes (Signed)
Patient walked out of the department stating her son was here and she is leaving, patient seen walking out with no difficulty and into a vehicle.

## 2020-01-03 NOTE — ED Triage Notes (Signed)
Per EMS-has been taking Haldol 10 mg for 3 months-had Haldol 30 mg IM on Monday-started having tremors hours after injection- patient informed psychiatrist-states cogentin was not helping

## 2020-01-03 NOTE — ED Notes (Signed)
Vitals noted to be missing for this pt when I took over this assignment. Attempted to obtain v/s in lobby, however pt did not answer. Triage RN notified.

## 2020-01-03 NOTE — ED Notes (Signed)
Pt came back in and v/s were obtained. Pt c/o neck pain and requesting pain medication.  I advised the pt that we cannot give her anything at this time and a doctor would need to see her first. Pt requested a heating pack, which I provided to her.

## 2020-02-23 ENCOUNTER — Other Ambulatory Visit: Payer: Self-pay

## 2020-02-23 ENCOUNTER — Emergency Department (HOSPITAL_COMMUNITY)
Admission: EM | Admit: 2020-02-23 | Discharge: 2020-02-23 | Disposition: A | Discharge status: Home / Self Care | Payer: Medicaid Other | Attending: Emergency Medicine | Admitting: Emergency Medicine

## 2020-02-23 DIAGNOSIS — R059 Cough, unspecified: Secondary | ICD-10-CM | POA: Insufficient documentation

## 2020-02-23 DIAGNOSIS — F17210 Nicotine dependence, cigarettes, uncomplicated: Secondary | ICD-10-CM | POA: Insufficient documentation

## 2020-02-23 DIAGNOSIS — Z20822 Contact with and (suspected) exposure to covid-19: Secondary | ICD-10-CM | POA: Diagnosis not present

## 2020-02-23 DIAGNOSIS — I10 Essential (primary) hypertension: Secondary | ICD-10-CM | POA: Insufficient documentation

## 2020-02-23 DIAGNOSIS — Z79899 Other long term (current) drug therapy: Secondary | ICD-10-CM | POA: Diagnosis not present

## 2020-02-23 LAB — RESPIRATORY PANEL BY RT PCR (FLU A&B, COVID)
Influenza A by PCR: NEGATIVE
Influenza B by PCR: NEGATIVE
SARS Coronavirus 2 by RT PCR: NEGATIVE

## 2020-02-23 MED ORDER — ALBUTEROL SULFATE HFA 108 (90 BASE) MCG/ACT IN AERS
2.0000 | INHALATION_SPRAY | Freq: Once | RESPIRATORY_TRACT | Status: AC
  Administered 2020-02-23: 2 via RESPIRATORY_TRACT
  Filled 2020-02-23: qty 6.7

## 2020-02-23 MED ORDER — BENZONATATE 100 MG PO CAPS
100.0000 mg | ORAL_CAPSULE | Freq: Three times a day (TID) | ORAL | 0 refills | Status: DC
Start: 2020-02-23 — End: 2020-03-20

## 2020-02-23 NOTE — ED Triage Notes (Signed)
Patient BIB EMS for complaint of nonproductive cough for several weeks. Per EMS, patient changed her story about when cough started several times and has clear lung sounds. Patient presents ambulatory without assistance, not coughing, and AxOx4. VSS.

## 2020-02-23 NOTE — Discharge Instructions (Addendum)
Continue taking home medications as prescribed. Use the inhaler as needed for chest tightness, coughing fits, or wheezing.  Take tessalon 3 times a day to prevent cough.  If you continue to have cough, follow up with your primary care doctor. Your Covid test is pending.  If positive, you will receive a phone call.  If negative, you will not.  Either way you may check in on MyChart. Return to the ER if you develop fevers, chest pain, difficulty breathing, or any new, worsening, or concerning symptoms.

## 2020-02-23 NOTE — ED Provider Notes (Signed)
[facility] DEPT Provider Note   CSN: [id number] Arrival date & time: 02/23/20  2002     History Chief Complaint  Patient presents with   Cough    [name] is a 49 y.o. female presenting for evaluation of cough.  Patient states that the past 3 to 4 weeks, she has had a persistent cough.  She has an intermittent cough, not worsening or improving. Cough is nonproductive. Not worse with exertion or time of day.  She denies associated fever, chills, sore throat, nasal congestion, chest pain, shortness of breath, nausea, vomiting, abdominal pain.  She denies GERD or reflux symptoms.  She is not taking anything for her symptoms.  She does report a history bronchitis, states she feels she needs an inhaler.  She smokes a pack of cigarettes a day.  She has a PCP, but has not followed up with them regarding cough.  She has not yet received the Covid vaccines.  She denies sick contacts  HPI     Past Medical History:  Diagnosis Date   Arthritis    Bipolar 1 disorder ([place])    Bronchitis    Depression    Hemorrhoid    Schizoaffective disorder ([place])    Schizophrenia ([place])    Pt denies and reports it is schizoaffective disorder.    Transfusion history     Patient Active Problem List   Diagnosis Date Noted   Schizophrenia ([place]) 02/07/2019   Schizoaffective disorder ([place]) 11/08/2018   No diagnosis on Axis I 08/21/2015   Abnormal behavior    Involuntary commitment    Psychosis ([place])    Disturbance in affect    Tobacco use disorder 05/12/2015   Cannabis use disorder, moderate, dependence ([place]) 05/12/2015   Schizo affective schizophrenia ([place]) 05/10/2015   Hypertension 02/24/2011   ANEMIA-IRON DEFICIENCY 12/13/2006   GERD 12/13/2006   OBESITY 12/12/2006   Schizoaffective disorder, bipolar type ([place]) 12/12/2006   HEMORRHOIDS, NOS 12/12/2006   PAIN-NECK 12/12/2006   Abnormal involuntary movement 12/12/2006    Past  Surgical History:  Procedure Laterality Date   CESAREAN SECTION       OB History   No obstetric history on file.     Family History  Problem Relation Age of Onset   Diabetes type II Other     Social History   Tobacco Use   Smoking status: Current Every Day Smoker    Packs/day: 0.50    Years: 17.00    Pack years: 8.50    Types: Cigarettes   Smokeless tobacco: Never Used  Vaping Use   Vaping Use: Never used  Substance Use Topics   Alcohol use: No   Drug use: No    Types: Marijuana    Comment: Former user    Home Medications Prior to Admission medications   Medication Sig Start Date End Date Taking? Authorizing Provider  benzonatate (TESSALON) 100 MG capsule Take 1 capsule (100 mg total) by mouth every 8 (eight) hours. 02/23/20   Dunya Meiners, PA-C  carbamazepine (TEGRETOL XR) 200 MG 12 hr tablet Take 1 tablet (200 mg total) by mouth daily. For mood stabilization 12/01/19   Lindell Spar I, NP  haloperidol (HALDOL) 10 MG tablet Take 1 tablet (10 mg total) by mouth at bedtime. 12/10/19 01/09/20  Money, Lowry Ram, FNP  hydrOXYzine (ATARAX/VISTARIL) 25 MG tablet Take 1 tablet (25 mg total) by mouth 3 (three) times daily as needed for anxiety. Patient taking differently: Take 25 mg by mouth 3 (  three) times daily.  11/30/19   Lindell Spar I, NP  nicotine polacrilex (NICORETTE) 2 MG gum Take 1 each (2 mg total) by mouth as needed. (May buy from over the counter): For smoking cessation Patient not taking: Reported on 12/10/2019 11/30/19   Lindell Spar I, NP  traZODone (DESYREL) 50 MG tablet Take 1 tablet (50 mg total) by mouth at bedtime as needed for sleep. Patient taking differently: Take 50 mg by mouth at bedtime.  11/30/19   Lindell Spar I, NP  trihexyphenidyl (ARTANE) 2 MG tablet Take 1 tablet (2 mg total) by mouth 2 (two) times daily with a meal. For prevention drug induced tremors. 11/30/19   Encarnacion Slates, NP    Allergies    Anette Guarneri [lurasidone hcl], Abilify  [aripiprazole], Flagyl [metronidazole hcl], Prolixin [fluphenazine], Risperidone and related, Seroquel [quetiapine fumarate], and Sulfa antibiotics  Review of Systems   Review of Systems  Respiratory: Positive for cough.   All other systems reviewed and are negative.   Physical Exam Updated Vital Signs BP 140/85    Pulse 79    Temp 98.8 F (37.1 C) (Oral)    Resp 16    Ht 5\' 4"  (1.626 m)    Wt 113.4 kg    SpO2 98%    BMI 42.91 kg/m   Physical Exam Vitals and nursing note reviewed.  Constitutional:      General: She is not in acute distress.    Appearance: She is well-developed.  HENT:     Head: Normocephalic and atraumatic.  Eyes:     Conjunctiva/sclera: Conjunctivae normal.     Pupils: Pupils are equal, round, and reactive to light.  Cardiovascular:     Rate and Rhythm: Normal rate and regular rhythm.     Pulses: Normal pulses.  Pulmonary:     Effort: Pulmonary effort is normal. No respiratory distress.     Breath sounds: Normal breath sounds. No wheezing.     Comments: Clear lung sounds in all fields.  Sats stable on room air.  1 cough noted during exam, but no significant bronchospasm. Abdominal:     General: There is no distension.     Palpations: Abdomen is soft. There is no mass.     Tenderness: There is no abdominal tenderness. There is no guarding or rebound.  Musculoskeletal:        General: Normal range of motion.     Cervical back: Normal range of motion and neck supple.  Skin:    General: Skin is warm and dry.     Capillary Refill: Capillary refill takes less than 2 seconds.  Neurological:     Mental Status: She is alert and oriented to person, place, and time.     ED Results / Procedures / Treatments   Labs (all labs ordered are listed, but only abnormal results are displayed) Labs Reviewed  RESPIRATORY PANEL BY RT PCR (FLU A&B, COVID)    EKG None  Radiology No results found.  Procedures Procedures (including critical care time)  Medications  Ordered in ED Medications  albuterol (VENTOLIN HFA) 108 (90 Base) MCG/ACT inhaler 2 puff (2 puffs Inhalation Given 02/23/20 2035)    ED Course  I have reviewed the triage vital signs and the nursing notes.  Pertinent labs & imaging results that were available during my care of the patient were reviewed by me and considered in my medical decision making (see chart for details).    MDM Rules/Calculators/A&P  Patient presenting for evaluation of several week history of cough.  On exam, patient appears nontoxic.  She is not having infectious symptoms like fever or sputum production.  As such, doubt pneumonia.  Due to persistence of cough, consider bronchitis/asthma.  Especially the setting of heavy smoking.  Less likely GERD, as patient without reflux symptoms.  Also consider lung cancer, but this can be worked up with patient's PCP.  Patient without chest pain, shortness of breath, hypoxia, tachycardia, doubt PE.  Discussed findings with patient.  Discussed symptomatic treatment with Tessalon and albuterol.  Encourage close follow-up with PCP.  At this time, patient appears safe for discharge.  Return precautions given.  Patient states she understands and agrees to plan.  Final Clinical Impression(s) / ED Diagnoses Final diagnoses:  Cough    Rx / DC Orders ED Discharge Orders         Ordered    benzonatate (TESSALON) 100 MG capsule  Every 8 hours        02/23/20 2026           Franchot Heidelberg, PA-C 02/23/20 2132    Sherwood Gambler, MD 02/23/20 2205

## 2020-03-11 ENCOUNTER — Other Ambulatory Visit: Payer: Self-pay

## 2020-03-11 ENCOUNTER — Ambulatory Visit (HOSPITAL_COMMUNITY)
Admission: EM | Admit: 2020-03-11 | Discharge: 2020-03-12 | Disposition: A | Discharge status: Home / Self Care | Payer: Medicaid Other | Attending: Nurse Practitioner | Admitting: Nurse Practitioner

## 2020-03-11 ENCOUNTER — Encounter (HOSPITAL_COMMUNITY): Payer: Self-pay

## 2020-03-11 DIAGNOSIS — F17210 Nicotine dependence, cigarettes, uncomplicated: Secondary | ICD-10-CM | POA: Diagnosis not present

## 2020-03-11 DIAGNOSIS — F259 Schizoaffective disorder, unspecified: Secondary | ICD-10-CM | POA: Diagnosis not present

## 2020-03-11 DIAGNOSIS — Z79899 Other long term (current) drug therapy: Secondary | ICD-10-CM | POA: Diagnosis not present

## 2020-03-11 DIAGNOSIS — R45850 Homicidal ideations: Secondary | ICD-10-CM | POA: Insufficient documentation

## 2020-03-11 DIAGNOSIS — Z20822 Contact with and (suspected) exposure to covid-19: Secondary | ICD-10-CM | POA: Insufficient documentation

## 2020-03-11 LAB — CBC WITH DIFFERENTIAL/PLATELET
Abs Immature Granulocytes: 0.06 10*3/uL (ref 0.00–0.07)
Basophils Absolute: 0 10*3/uL (ref 0.0–0.1)
Basophils Relative: 0 %
Eosinophils Absolute: 0.1 10*3/uL (ref 0.0–0.5)
Eosinophils Relative: 1 %
HCT: 32.4 % — ABNORMAL LOW (ref 36.0–46.0)
Hemoglobin: 10 g/dL — ABNORMAL LOW (ref 12.0–15.0)
Immature Granulocytes: 1 %
Lymphocytes Relative: 30 %
Lymphs Abs: 3.3 10*3/uL (ref 0.7–4.0)
MCH: 24.4 pg — ABNORMAL LOW (ref 26.0–34.0)
MCHC: 30.9 g/dL (ref 30.0–36.0)
MCV: 79 fL — ABNORMAL LOW (ref 80.0–100.0)
Monocytes Absolute: 0.5 10*3/uL (ref 0.1–1.0)
Monocytes Relative: 4 %
Neutro Abs: 7.1 10*3/uL (ref 1.7–7.7)
Neutrophils Relative %: 64 %
Platelets: 401 10*3/uL — ABNORMAL HIGH (ref 150–400)
RBC: 4.1 MIL/uL (ref 3.87–5.11)
RDW: 16.7 % — ABNORMAL HIGH (ref 11.5–15.5)
WBC: 11.1 10*3/uL — ABNORMAL HIGH (ref 4.0–10.5)
nRBC: 0 % (ref 0.0–0.2)

## 2020-03-11 LAB — COMPREHENSIVE METABOLIC PANEL
ALT: 7 U/L (ref 0–44)
AST: 11 U/L — ABNORMAL LOW (ref 15–41)
Albumin: 3.7 g/dL (ref 3.5–5.0)
Alkaline Phosphatase: 74 U/L (ref 38–126)
Anion gap: 10 (ref 5–15)
BUN: 7 mg/dL (ref 6–20)
CO2: 23 mmol/L (ref 22–32)
Calcium: 8.7 mg/dL — ABNORMAL LOW (ref 8.9–10.3)
Chloride: 101 mmol/L (ref 98–111)
Creatinine, Ser: 0.94 mg/dL (ref 0.44–1.00)
GFR, Estimated: >60 mL/min (ref >60)
Glucose, Bld: 125 mg/dL — ABNORMAL HIGH (ref 70–99)
Potassium: 3.7 mmol/L (ref 3.5–5.1)
Sodium: 134 mmol/L — ABNORMAL LOW (ref 135–145)
Total Bilirubin: 0.3 mg/dL (ref 0.3–1.2)
Total Protein: 8.4 g/dL — ABNORMAL HIGH (ref 6.5–8.1)

## 2020-03-11 LAB — RESPIRATORY PANEL BY RT PCR (FLU A&B, COVID)
Influenza A by PCR: NEGATIVE
Influenza B by PCR: NEGATIVE
SARS Coronavirus 2 by RT PCR: NEGATIVE

## 2020-03-11 LAB — POC SARS CORONAVIRUS 2 AG -  ED: SARS Coronavirus 2 Ag: NEGATIVE

## 2020-03-11 MED ORDER — METRONIDAZOLE 250 MG PO TABS
500.0000 mg | ORAL_TABLET | Freq: Two times a day (BID) | ORAL | Status: DC
  Administered 2020-03-11 – 2020-03-12 (×2): 500 mg via ORAL
  Filled 2020-03-11 (×2): qty 2

## 2020-03-11 MED ORDER — DIPHENHYDRAMINE HCL 50 MG PO CAPS
50.0000 mg | ORAL_CAPSULE | Freq: Three times a day (TID) | ORAL | Status: DC
  Administered 2020-03-11 – 2020-03-12 (×2): 50 mg via ORAL
  Filled 2020-03-11 (×2): qty 1

## 2020-03-11 MED ORDER — ALUM & MAG HYDROXIDE-SIMETH 200-200-20 MG/5ML PO SUSP: 30.0000 mL | ORAL | Status: DC | PRN

## 2020-03-11 MED ORDER — MAGNESIUM HYDROXIDE 400 MG/5ML PO SUSP: 30.0000 mL | Freq: Every day | ORAL | Status: DC | PRN

## 2020-03-11 MED ORDER — QUETIAPINE FUMARATE 100 MG PO TABS
100.0000 mg | ORAL_TABLET | Freq: Every day | ORAL | Status: DC
  Administered 2020-03-11: 100 mg via ORAL
  Filled 2020-03-11: qty 1

## 2020-03-11 NOTE — ED Provider Notes (Signed)
Behavioral Health Admission H&P [facility])  Date: 03/11/20 Patient Name: [name] MRN: [id number] Chief Complaint:  Chief Complaint  Patient presents with  . Hallucinations    patient stated verbal hallucinations   Chief Complaint/Presenting Problem: Pt states, "hallucinations and voices."  Diagnoses:  Final diagnoses:  Schizoaffective disorder, unspecified type ([place])    HPI: [name] is a 49 y.o. female with a history of schizoaffective disorder who presents to [facility] voluntarily with the Crisis Response Team after making several calls to 911 due to feeling like "things are happening." States that she is seeing things like "cups disappearing in front of me." She reports that she hears voices that sometimes tell her to harm herself. She states that the voices are currently "calm" and not as loud as they have been previously. She states that she does not feel her medications are helping, although she acknowledges that her symptoms are less severe than they have been in the past. She states "I feel like a [place] pig with all the medications." She states that she was most recently switched to abilify LAI. States that she completed 30 days of oral Abilify. She states that she was also recently started on Seroquel 100 mg QHS. She does not recall when she last received an Abilify injection. On PTA med review, it is noted that Aristada 882 mg and Aristada Initio 675 mg were filled on 01/22/2020. Patient states that she is followed by Dr. [name] and [facility] but she is unsure when she last received the Presho. Patient denies suicidal ideations. She endorses homicidal ideations toward a neighbor who is noisy, but denies any homicidal intent or plan and states "she is more likely to kill me." She denies substance use.  PHQ 2-9:     Admission (Discharged) from 11/28/2019 in [facility] ED from 11/27/2019 in [facility]  Admission (Discharged) from 02/06/2019 in [facility]  C-SSRS RISK CATEGORY Low Risk Low Risk Error: Question 6 not populated       Total Time spent with patient: 30 minutes  Musculoskeletal  Strength & Muscle Tone: within normal limits Gait & Station: normal Patient leans: N/A  Psychiatric Specialty Exam  Presentation General Appearance: Casual;Neat  Eye Contact:Good  Speech:Clear and Coherent;Normal Rate  Speech Volume:Normal  Handedness:Right   Mood and Affect  Mood:Anxious;Depressed  Affect:Appropriate;Congruent   Thought Process  Thought Processes:Coherent  Descriptions of Associations:Intact  Orientation:Full (Time, Place and Person)  Thought Content:Paranoid Ideation  Hallucinations:Hallucinations: Auditory Description of Auditory Hallucinations: voice sometimes tell her to harm herself or others. States that the coices are "calm" right now.  Ideas of Reference:Paranoia  Suicidal Thoughts:Suicidal Thoughts: No  Homicidal Thoughts:Homicidal Thoughts: Yes, Passive HI Passive Intent and/or Plan: Without Intent;Without Plan (expresses some homicidal thoguhts toward a neighbor that is noisey. Denies any intent or plan and states "she is more likely to kill me.")   Sensorium  Memory:Immediate Good;Recent Good;Remote Good  Judgment:Fair  Insight:Fair   Executive Functions  Concentration:Good  Attention Span:Good  [place] of Knowledge:Good  Language:Good   Psychomotor Activity  Psychomotor Activity:Psychomotor Activity: Normal   Assets  Assets:Communication Skills;Desire for Improvement;Financial Resources/Insurance;Housing   Sleep  Sleep:Sleep: Good   Physical Exam Constitutional:      General: She is not in acute distress.    Appearance: She is not ill-appearing, toxic-appearing or diaphoretic.  HENT:     Head: Normocephalic.     Right Ear:  External ear normal.     Left Ear: External  ear normal.  Eyes:     Conjunctiva/sclera: Conjunctivae normal.     Pupils: Pupils are equal, round, and reactive to light.  Cardiovascular:     Rate and Rhythm: Normal rate.  Pulmonary:     Effort: Pulmonary effort is normal. No respiratory distress.  Musculoskeletal:        General: Normal range of motion.  Skin:    General: Skin is warm and dry.  Neurological:     Mental Status: She is alert and oriented to person, place, and time.  Psychiatric:        Mood and Affect: Mood is anxious and depressed.        Thought Content: Thought content is not paranoid or delusional. Thought content includes homicidal ideation. Thought content does not include suicidal ideation. Thought content does not include homicidal plan.    Review of Systems  Constitutional: Negative for chills, diaphoresis, fever, malaise/fatigue and weight loss.  HENT: Negative for congestion.   Respiratory: Negative for cough and shortness of breath.   Cardiovascular: Negative for chest pain and palpitations.  Gastrointestinal: Negative for diarrhea, nausea and vomiting.  Neurological: Negative for dizziness and seizures.  Psychiatric/Behavioral: Positive for depression, hallucinations and suicidal ideas. Negative for memory loss and substance abuse. The patient is nervous/anxious. The patient does not have insomnia.   All other systems reviewed and are negative.   Blood pressure 137/81, pulse (!) 112, temperature 98.4 F (36.9 C), temperature source Oral, resp. rate 16, SpO2 100 %. There is no height or weight on file to calculate BMI.  Past Psychiatric History: Patient has had multiple psychiatric admissions.  Her last admission at [facility] was in 11/2019.  She has been treated with numerous medications including the long-acting Abilify injection, iloperidone, Prolixin, Haldol, Trilafon, Seroquel, as well as Rexulti.  She is followed by ACTT services.  Is the patient at risk to self? No  Has the patient been a risk  to self in the past 6 months? Yes .    Has the patient been a risk to self within the distant past? Yes   Is the patient a risk to others? No   Has the patient been a risk to others in the past 6 months? Yes   Has the patient been a risk to others within the distant past? Yes   Past Medical History:  Past Medical History:  Diagnosis Date  . Arthritis   . Bipolar 1 disorder ([place])   . Bronchitis   . Depression   . Hemorrhoid   . Schizoaffective disorder ([place])   . Schizophrenia ([place])    Pt denies and reports it is schizoaffective disorder.   . Transfusion history     Past Surgical History:  Procedure Laterality Date  . CESAREAN SECTION      Family History:  Family History  Problem Relation Age of Onset  . Diabetes type II Other     Social History:  Social History   Socioeconomic History  . Marital status: Single    Spouse name: Not on file  . Number of children: Not on file  . Years of education: Not on file  . Highest education level: Not on file  Occupational History  . Occupation: unemployed  Tobacco Use  . Smoking status: Current Every Day Smoker    Packs/day: 0.50    Years: 17.00    Pack years: 8.50    Types: Cigarettes  .  Smokeless tobacco: Never Used  Vaping Use  . Vaping Use: Never used  Substance and Sexual Activity  . Alcohol use: No  . Drug use: No    Types: Marijuana    Comment: Former user  . Sexual activity: Not Currently  Other Topics Concern  . Not on file  Social History Narrative   ** Merged History Encounter **       Social Determinants of Health   Financial Resource Strain:   . Difficulty of Paying Living Expenses: Not on file  Food Insecurity:   . Worried About Charity fundraiser in the Last Year: Not on file  . Ran Out of Food in the Last Year: Not on file  Transportation Needs:   . Lack of Transportation (Medical): Not on file  . Lack of Transportation (Non-Medical): Not on file  Physical Activity:   . Days of Exercise per  Week: Not on file  . Minutes of Exercise per Session: Not on file  Stress:   . Feeling of Stress : Not on file  Social Connections:   . Frequency of Communication with Friends and Family: Not on file  . Frequency of Social Gatherings with Friends and Family: Not on file  . Attends Religious Services: Not on file  . Active Member of Clubs or Organizations: Not on file  . Attends Archivist Meetings: Not on file  . Marital Status: Not on file  Intimate Partner Violence:   . Fear of Current or Ex-Partner: Not on file  . Emotionally Abused: Not on file  . Physically Abused: Not on file  . Sexually Abused: Not on file    SDOH:  SDOH Screenings   Alcohol Screen: Low Risk   . Last Alcohol Screening Score (AUDIT): 0  Depression (PHQ2-9):   . PHQ-2 Score: Not on file  Financial Resource Strain:   . Difficulty of Paying Living Expenses: Not on file  Food Insecurity:   . Worried About Charity fundraiser in the Last Year: Not on file  . Ran Out of Food in the Last Year: Not on file  Housing:   . Last Housing Risk Score: Not on file  Physical Activity:   . Days of Exercise per Week: Not on file  . Minutes of Exercise per Session: Not on file  Social Connections:   . Frequency of Communication with Friends and Family: Not on file  . Frequency of Social Gatherings with Friends and Family: Not on file  . Attends Religious Services: Not on file  . Active Member of Clubs or Organizations: Not on file  . Attends Archivist Meetings: Not on file  . Marital Status: Not on file  Stress:   . Feeling of Stress : Not on file  Tobacco Use: High Risk  . Smoking Tobacco Use: Current Every Day Smoker  . Smokeless Tobacco Use: Never Used  Transportation Needs:   . Film/video editor (Medical): Not on file  . Lack of Transportation (Non-Medical): Not on file    Last Labs:  Admission on 03/11/2020  Component Date Value Ref Range Status  . SARS Coronavirus 2 Ag  03/11/2020 Negative  Negative Preliminary  Admission on 02/23/2020, Discharged on 02/23/2020  Component Date Value Ref Range Status  . SARS Coronavirus 2 by RT PCR 02/23/2020 NEGATIVE  NEGATIVE Final   Comment: (NOTE) SARS-CoV-2 target nucleic acids are NOT DETECTED.  The SARS-CoV-2 RNA is generally detectable in upper respiratoy specimens during the acute phase  of infection. The lowest concentration of SARS-CoV-2 viral copies this assay can detect is 131 copies/mL. A negative result does not preclude SARS-Cov-2 infection and should not be used as the sole basis for treatment or other patient management decisions. A negative result may occur with  improper specimen collection/handling, submission of specimen other than nasopharyngeal swab, presence of viral mutation(s) within the areas targeted by this assay, and inadequate number of viral copies (<131 copies/mL). A negative result must be combined with clinical observations, patient history, and epidemiological information. The expected result is Negative.  Fact Sheet for Patients:  PinkCheek.be  Fact Sheet for Healthcare Providers:  GravelBags.it  This test is no                          t yet approved or cleared by the [place] FDA and  has been authorized for detection and/or diagnosis of SARS-CoV-2 by FDA under an Emergency Use Authorization (EUA). This EUA will remain  in effect (meaning this test can be used) for the duration of the COVID-19 declaration under Section 564(b)(1) of the Act, 21 U.S.C. section 360bbb-3(b)(1), unless the authorization is terminated or revoked sooner.    . Influenza A by PCR 02/23/2020 NEGATIVE  NEGATIVE Final  . Influenza B by PCR 02/23/2020 NEGATIVE  NEGATIVE Final   Comment: (NOTE) The Xpert Xpress SARS-CoV-2/FLU/RSV assay is intended as an aid in  the diagnosis of influenza from Nasopharyngeal swab specimens and  should not be  used as a sole basis for treatment. Nasal washings and  aspirates are unacceptable for Xpert Xpress SARS-CoV-2/FLU/RSV  testing.  Fact Sheet for Patients: PinkCheek.be  Fact Sheet for Healthcare Providers: GravelBags.it  This test is not yet approved or cleared by the [place] FDA and  has been authorized for detection and/or diagnosis of SARS-CoV-2 by  FDA under an Emergency Use Authorization (EUA). This EUA will remain  in effect (meaning this test can be used) for the duration of the  Covid-19 declaration under Section 564(b)(1) of the Act, 21  U.S.C. section 360bbb-3(b)(1), unless the authorization is  terminated or revoked. Performed at [facility], [place] [address] [place], [place] [address]   Admission on 01/03/2020, Discharged on 01/03/2020  Component Date Value Ref Range Status  . Color, Urine 01/03/2020 YELLOW  YELLOW Final  . APPearance 01/03/2020 TURBID* CLEAR Final  . Specific Gravity, Urine 01/03/2020 1.025  1.005 - 1.030 Final  . pH 01/03/2020 5.0  5.0 - 8.0 Final  . Glucose, UA 01/03/2020 NEGATIVE  NEGATIVE mg/dL Final  . Hgb urine dipstick 01/03/2020 NEGATIVE  NEGATIVE Final  . Bilirubin Urine 01/03/2020 NEGATIVE  NEGATIVE Final  . Ketones, ur 01/03/2020 NEGATIVE  NEGATIVE mg/dL Final  . Protein, ur 01/03/2020 30* NEGATIVE mg/dL Final  . Nitrite 01/03/2020 NEGATIVE  NEGATIVE Final  . Leukocytes,Ua 01/03/2020 MODERATE* NEGATIVE Final  . WBC, UA 01/03/2020 6-10  0 - 5 WBC/hpf Final  . Bacteria, UA 01/03/2020 RARE* NONE SEEN Final  . Squamous Epithelial / LPF 01/03/2020 11-20  0 - 5 Final  . Amorphous Crystal 01/03/2020 PRESENT   Final   Performed at [facility], [place] [address], [place], [place] [address]  Admission on 11/28/2019, Discharged on 11/30/2019  Component Date Value Ref Range Status  . Vitamin B-12 11/29/2019 221  180 - 914 pg/mL Final   Comment:  (NOTE) This assay is not validated for testing neonatal or myeloproliferative syndrome specimens for Vitamin  B12 levels. Performed at [facility], [place] [address] [place], [place] [address]   . Carbamazepine Lvl 11/29/2019 2.9* 4.0 - 12.0 ug/mL Final   Performed at [place] [address] [place], [place] [address]  . Color, Urine 11/29/2019 YELLOW  YELLOW Final  . APPearance 11/29/2019 HAZY* CLEAR Final  . Specific Gravity, Urine 11/29/2019 1.014  1.005 - 1.030 Final  . pH 11/29/2019 6.0  5.0 - 8.0 Final  . Glucose, UA 11/29/2019 NEGATIVE  NEGATIVE mg/dL Final  . Hgb urine dipstick 11/29/2019 MODERATE* NEGATIVE Final  . Bilirubin Urine 11/29/2019 NEGATIVE  NEGATIVE Final  . Ketones, ur 11/29/2019 NEGATIVE  NEGATIVE mg/dL Final  . Protein, ur 11/29/2019 NEGATIVE  NEGATIVE mg/dL Final  . Nitrite 11/29/2019 NEGATIVE  NEGATIVE Final  . Chalmers Guest 11/29/2019 LARGE* NEGATIVE Final  . RBC / HPF 11/29/2019 21-50  0 - 5 RBC/hpf Final  . WBC, UA 11/29/2019 21-50  0 - 5 WBC/hpf Final  . Bacteria, UA 11/29/2019 FEW* NONE SEEN Final  . Squamous Epithelial / LPF 11/29/2019 6-10  0 - 5 Final   Performed at [facility], [place] [address], [place], [place] [address]  . Ferritin 11/30/2019 12  11 - 307 ng/mL Final   Performed at [place] [address] [place], [place] [address]  . Folate 11/30/2019 5.9* >5.9 ng/mL Final   Performed at [place] [address], [place], [place] [address]  . Total Protein 11/30/2019 7.6  6.5 - 8.1 g/dL Final  . Albumin 11/30/2019 4.2  3.5 - 5.0 g/dL Final  . AST 11/30/2019 17  15 - 41 U/L Final  . ALT 11/30/2019 9  0 - 44 U/L Final  . Alkaline Phosphatase 11/30/2019 86  38 - 126 U/L Final  . Total Bilirubin 11/30/2019 0.3  0.3 - 1.2 mg/dL Final  . Bilirubin, Direct 11/30/2019 <0.1  0.0 - 0.2 mg/dL Final  . Indirect Bilirubin 11/30/2019 NOT CALCULATED  0.3 - 0.9 mg/dL  Final   Performed at [facility], [place] [address] [place], [place] [address]  . Iron 11/30/2019 28  28 - 170 ug/dL Final  . TIBC 11/30/2019 413  250 - 450 ug/dL Final  . Saturation Ratios 11/30/2019 7* 10.4 - 31.8 % Final  . UIBC 11/30/2019 385  ug/dL Final   Performed at [facility], [place] [address], [place], [place] [address]  . Retic Ct Pct 11/30/2019 1.2  0.4 - 3.1 % Final  . RBC. 11/30/2019 3.58* 3.87 - 5.11 MIL/uL Final  . Retic Count, Absolute 11/30/2019 44.4  19.0 - 186.0 K/uL Final  . Immature Retic Fract 11/30/2019 20.9* 2.3 - 15.9 % Final   Performed at [facility], [place] [address], [place],  [address]  Admission on 11/27/2019, Discharged on 11/28/2019  Component Date Value Ref Range Status  . SARS Coronavirus 2 11/27/2019 NEGATIVE  NEGATIVE Final   Comment: (NOTE) SARS-CoV-2 target nucleic acids are NOT DETECTED.  The SARS-CoV-2 RNA is generally detectable in upper and lower respiratory specimens during the acute phase of infection. The lowest concentration of SARS-CoV-2 viral copies this assay can detect is 250 copies / mL. A negative result does not preclude SARS-CoV-2 infection and should not be used as the sole basis for treatment or other patient management decisions.  A negative result may occur with improper specimen collection / handling, submission of specimen other than nasopharyngeal swab, presence of viral mutation(s)  within the areas targeted by this assay, and inadequate number of viral copies (<250 copies / mL). A negative result must be combined with clinical observations, patient history, and epidemiological information.  Fact Sheet for Patients:   StrictlyIdeas.no  Fact Sheet for Healthcare Providers: BankingDealers.co.za  This test is not yet approved or                           cleared by the [place] FDA and has been authorized for  detection and/or diagnosis of SARS-CoV-2 by FDA under an Emergency Use Authorization (EUA).  This EUA will remain in effect (meaning this test can be used) for the duration of the COVID-19 declaration under Section 564(b)(1) of the Act, 21 U.S.C. section 360bbb-3(b)(1), unless the authorization is terminated or revoked sooner.  Performed at [facility], [place] [address], [place], [place] [address]   . WBC 11/27/2019 11.3* 4.0 - 10.5 K/uL Final  . RBC 11/27/2019 3.69* 3.87 - 5.11 MIL/uL Final  . Hemoglobin 11/27/2019 9.0* 12.0 - 15.0 g/dL Final  . HCT 11/27/2019 29.0* 36 - 46 % Final  . MCV 11/27/2019 78.6* 80.0 - 100.0 fL Final  . MCH 11/27/2019 24.4* 26.0 - 34.0 pg Final  . MCHC 11/27/2019 31.0  30.0 - 36.0 g/dL Final  . RDW 11/27/2019 18.1* 11.5 - 15.5 % Final  . Platelets 11/27/2019 362  150 - 400 K/uL Final  . nRBC 11/27/2019 0.0  0.0 - 0.2 % Final  . Neutrophils Relative % 11/27/2019 56  % Final  . Neutro Abs 11/27/2019 6.3  1.7 - 7.7 K/uL Final  . Lymphocytes Relative 11/27/2019 37  % Final  . Lymphs Abs 11/27/2019 4.2* 0.7 - 4.0 K/uL Final  . Monocytes Relative 11/27/2019 5  % Final  . Monocytes Absolute 11/27/2019 0.6  0.1 - 1.0 K/uL Final  . Eosinophils Relative 11/27/2019 1  % Final  . Eosinophils Absolute 11/27/2019 0.1  0.0 - 0.5 K/uL Final  . Basophils Relative 11/27/2019 1  % Final  . Basophils Absolute 11/27/2019 0.1  0.0 - 0.1 K/uL Final  . Immature Granulocytes 11/27/2019 0  % Final  . Abs Immature Granulocytes 11/27/2019 0.04  0.00 - 0.07 K/uL Final   Performed at [facility], [place] [address], [place], [place] [address]  . Sodium 11/27/2019 135  135 - 145 mmol/L Final  . Potassium 11/27/2019 3.4* 3.5 - 5.1 mmol/L Final  . Chloride 11/27/2019 102  98 - 111 mmol/L Final  . CO2 11/27/2019 21* 22 - 32 mmol/L Final  . Glucose, Bld 11/27/2019 88  70 - 99 mg/dL Final   Glucose reference range applies only to samples taken after fasting for at least 8 hours.   . BUN 11/27/2019 7  6 - 20 mg/dL Final  . Creatinine, Ser 11/27/2019 0.81  0.44 - 1.00 mg/dL Final  . Calcium 11/27/2019 9.4  8.9 - 10.3 mg/dL Final  . Total Protein 11/27/2019 8.0  6.5 - 8.1 g/dL Final  . Albumin 11/27/2019 4.1  3.5 - 5.0 g/dL Final  . AST 11/27/2019 15  15 - 41 U/L Final  . ALT 11/27/2019 11  0 - 44 U/L Final  . Alkaline Phosphatase 11/27/2019 74  38 - 126 U/L Final  . Total Bilirubin 11/27/2019 0.6  0.3 - 1.2 mg/dL Final  . GFR calc non Af Amer 11/27/2019 >60  >60 mL/min Final  . GFR calc Af Amer 11/27/2019 >60  >  60 mL/min Final  . Anion gap 11/27/2019 12  5 - 15 Final   Performed at [facility], [place] [address], [place], [place] [address]  . Alcohol, Ethyl (B) 11/27/2019 <10  <10 mg/dL Final   Comment: (NOTE) Lowest detectable limit for serum alcohol is 10 mg/dL.  For medical purposes only. Performed at [facility], [place] [address], [place], [place] [address]   . Cholesterol 11/27/2019 193  0 - 200 mg/dL Final  . Triglycerides 11/27/2019 75  <150 mg/dL Final  . HDL 11/27/2019 54  >40 mg/dL Final  . Total CHOL/HDL Ratio 11/27/2019 3.6  RATIO Final  . VLDL 11/27/2019 15  0 - 40 mg/dL Final  . LDL Cholesterol 11/27/2019 124* 0 - 99 mg/dL Final   Comment:        Total Cholesterol/HDL:CHD Risk Coronary Heart Disease Risk Table                     Men   Women  1/2 Average Risk   3.4   3.3  Average Risk       5.0   4.4  2 X Average Risk   9.6   7.1  3 X Average Risk  23.4   11.0        Use the calculated Patient Ratio above and the CHD Risk Table to determine the patient's CHD Risk.        ATP III CLASSIFICATION (LDL):  <100     mg/dL   Optimal  100-129  mg/dL   Near or Above                    Optimal  130-159  mg/dL   Borderline  160-189  mg/dL   High  >190     mg/dL   Very High Performed at [place] [address], [place], [place] [address]   . TSH 11/27/2019 3.181  0.350 - 4.500 uIU/mL Final   Comment: Performed by a 3rd  Generation assay with a functional sensitivity of <=0.01 uIU/mL. Performed at [facility], [place] [address], [place], [place] [address]   . Color, Urine 11/27/2019 YELLOW  YELLOW Final  . APPearance 11/27/2019 CLOUDY* CLEAR Final  . Specific Gravity, Urine 11/27/2019 1.015  1.005 - 1.030 Final  . pH 11/27/2019 5.0  5.0 - 8.0 Final  . Glucose, UA 11/27/2019 NEGATIVE  NEGATIVE mg/dL Final  . Hgb urine dipstick 11/27/2019 MODERATE* NEGATIVE Final  . Bilirubin Urine 11/27/2019 NEGATIVE  NEGATIVE Final  . Ketones, ur 11/27/2019 5* NEGATIVE mg/dL Final  . Protein, ur 11/27/2019 NEGATIVE  NEGATIVE mg/dL Final  . Nitrite 11/27/2019 NEGATIVE  NEGATIVE Final  . Leukocytes,Ua 11/27/2019 LARGE* NEGATIVE Final  . RBC / HPF 11/27/2019 >50* 0 - 5 RBC/hpf Final  . WBC, UA 11/27/2019 21-50  0 - 5 WBC/hpf Final  . Bacteria, UA 11/27/2019 FEW* NONE SEEN Final  . Squamous Epithelial / LPF 11/27/2019 6-10  0 - 5 Final  . Mucus 11/27/2019 PRESENT   Final   Performed at [facility], [place] [address], [place], [place] [address]  . Opiates 11/27/2019 NONE DETECTED  NONE DETECTED Final  . Cocaine 11/27/2019 NONE DETECTED  NONE DETECTED Final  . Benzodiazepines 11/27/2019 NONE DETECTED  NONE DETECTED Final  . Amphetamines 11/27/2019 NONE DETECTED  NONE DETECTED Final  . Tetrahydrocannabinol 11/27/2019 POSITIVE* NONE DETECTED Final  . Barbiturates 11/27/2019 NONE DETECTED  NONE DETECTED  Final   Comment: (NOTE) DRUG SCREEN FOR MEDICAL PURPOSES ONLY.  IF CONFIRMATION IS NEEDED FOR ANY PURPOSE, NOTIFY LAB WITHIN 5 DAYS.  LOWEST DETECTABLE LIMITS FOR URINE DRUG SCREEN Drug Class                     Cutoff (ng/mL) Amphetamine and metabolites    1000 Barbiturate and metabolites    200 Benzodiazepine                 092 Tricyclics and metabolites     300 Opiates and metabolites        300 Cocaine and metabolites        300 THC                            50 Performed at [facility], [place] [address] [place], [place] [address]   . SARS Coronavirus 2 Ag 11/27/2019 NEGATIVE  NEGATIVE Final   Comment: (NOTE) SARS-CoV-2 antigen NOT DETECTED.   Negative results are presumptive.  Negative results do not preclude SARS-CoV-2 infection and should not be used as the sole basis for treatment or other patient management decisions, including infection  control decisions, particularly in the presence of clinical signs and  symptoms consistent with COVID-19, or in those who have been in contact with the virus.  Negative results must be combined with clinical observations, patient history, and epidemiological information. The expected result is Negative.  Fact Sheet for Patients: PodPark.tn  Fact Sheet for Healthcare Providers: GiftContent.is   This test is not yet approved or cleared by the [place] FDA and  has been authorized for detection and/or diagnosis of SARS-CoV-2 by FDA under an Emergency Use Authorization (EUA).  This EUA will remain in effect (meaning this test can be used) for the duration of  the C                          OVID-19 declaration under Section 564(b)(1) of the Act, 21 U.S.C. section 360bbb-3(b)(1), unless the authorization is terminated or revoked sooner.      Allergies: Latuda [lurasidone hcl], Abilify [aripiprazole], Flagyl [metronidazole hcl], Prolixin [fluphenazine], Risperidone and related, Seroquel [quetiapine fumarate], and Sulfa antibiotics  PTA Medications: (Not in a hospital admission)   Medical Decision Making  Admission labs ordered Reviewed TSH, Lipid Panel, and EKG from 11/2019 inpatient admission.  Continue home medications -seroquel 100 mg QHS for mood stability -benadryl 50 mg TID -flagyl 500 mg BID for bacterial vaginosis  Clinical Course as of Mar 12 36  Wed Mar 12, 2020  0035 WBC(!): 11.1 [JB]  0035 Hemoglobin(!): 10.0 [JB]  0035 HCT(!): 32.4 [JB]  0035  MCV(!): 79.0 [JB]  0035 MCH(!): 24.4 [JB]  0035 RDW(!): 16.7 [JB]  0035 CBC results similar to previous encounters  Platelets(!): 401 [JB]  0035 CBC results similar to previous encounters  CBC with Differential/Platelet(!) [JB]  0036 Sodium slightly decreased at 134. Will likely improve with adequate PO intake.   Sodium(!): 134 [JB]    Clinical Course User Index [JB] Rozetta Nunnery, NP    Recommendations  Based on my evaluation the patient does not appear to have an emergency medical condition.   Patient will be placed in the continuous assessment area at [facility] for treatment and stabilization. She will be reevaluated on 03/12/2020. The treatment team will determine disposition at that time.  Rozetta Nunnery, NP 03/11/20  10:04 PM

## 2020-03-11 NOTE — Progress Notes (Signed)
Received [name]  from the [facility] area  with [employer]. Her chief compliant is hearing voices. She was escorted to the locker room to secure her items and placed in one of the interview rooms. This Probation officer wrote down her medications before securing them in the  [place].  Benadryl 50 mg  Bethanechol Chloride 5 mg  HCTZ 12.5 mg  Primidone 50 mg  Seroquel 100 mg  Flagyl 500 mg  Tessalon 100 mg She is restless and somewhat irritable therefore her history was obtained from her medical records.
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
## 2020-03-12 NOTE — Discharge Instructions (Addendum)

## 2020-03-12 NOTE — ED Provider Notes (Signed)
FBC/OBS ASAP Discharge Summary  Date and Time: 03/12/2020 8:14 AM  Name: [name]  MRN:  [id number]   Discharge Diagnoses:  Final diagnoses:  Schizoaffective disorder, unspecified type [facility])    Subjective: Patient reports today that she is doing fine.  She denies any suicidal or homicidal ideations.  She reports that she came in because she is tired of dealing with her outpatient provider with her ACT team and states that she does not want to see him anymore.  She feels that she needs to be on different medications to manage her symptoms.  She states that she is interested in going to [facility] ACT team but has never used them before.  She states that she does not like [facility] and plans on leaving.  She also reports that she used to be on Haldol and it worked really well for her but she had a lot of shaking and other symptoms from the medication and could not take it anymore.  She states that she is not interested in using that drug and feels that her outpatient provider has hit a brick wall with maintaining her stabilization. Dr. [name], with [facility], was contacted.  He states that the patient has had issues with maintaining stability on her medications.  He states that she was doing extremely well on Haldol but had significant EPS symptoms.  He states that that had concern for the patient using cocaine which she has relapses however the patient has continued to refuse urine samples for UDS.  He states that they have no safety concerns for the patient if she is not endorsing any suicidal or homicidal ideations.  He states that he will have his team follow-up with her if she is discharged.  Stay Summary: 49 year old female with a history of schizoaffective disorder who presented to the [facility] voluntarily with her crisis response team after making several normal lung calls stating that things are happening.  Patient had reported having hallucinations and felt that  she needed to come to the hospital for stability.  Patient was started on Abilify long-acting injectable and was continuing Seroquel 100 mg nightly at bedtime.  Due to patient's concern for her hallucinations she was admitted to the continuous observation unit.  Patient has continued to deny any suicidal homicidal ideations and today was stating that she was trying to get away from her current outpatient provider because she did not feel that he was treating her appropriately.  She states that she only needs to be on 1 medication for management and that she wants to go to a different ACT team.  [facility] provider was contacted and he reported that the patient was on Haldol but was not able to tolerate it.  He states that she did receive an Abilify injection approximately within the last 2 weeks.  There were no safety concerns for the patient being discharged.  Patient did become irritable because she was not receiving a new provider prior to leaving even though she was informed that she would need to contact the ACT team and be dismissed and contact her new ACT team so that they could review her for excepting her.  Patient was then, and requested to have transportation home.  She was informed that her ACT team and following with her to maintain stability at this time. Patient presented with clear and coherent conversation and clear thought process with logical thoughts.  Patient does not appear to be responding to any internal or  external stimuli even though she reported that she was having some hallucinations.  Patient seems more upset about her ACT team and and wanting to see a physician.  Patient was agreeable to discharge home and follow-up with her ACT team.  Total Time spent with patient: 30 minutes  Past Psychiatric History: Schizoaffective disorder, bipolar 1 disorder, MDD, schizophrenia Past Medical History:  Past Medical History:  Diagnosis Date  . Arthritis   . Bipolar 1  disorder ([place])   . Bronchitis   . Depression   . Hemorrhoid   . Schizoaffective disorder ([place])   . Schizophrenia ([place])    Pt denies and reports it is schizoaffective disorder.   . Transfusion history     Past Surgical History:  Procedure Laterality Date  . CESAREAN SECTION     Family History:  Family History  Problem Relation Age of Onset  . Diabetes type II Other    Family Psychiatric History: None reported Social History:  Social History   Substance and Sexual Activity  Alcohol Use No     Social History   Substance and Sexual Activity  Drug Use No  . Types: Marijuana   Comment: Former user    Social History   Socioeconomic History  . Marital status: Single    Spouse name: Not on file  . Number of children: Not on file  . Years of education: Not on file  . Highest education level: Not on file  Occupational History  . Occupation: unemployed  Tobacco Use  . Smoking status: Current Every Day Smoker    Packs/day: 0.50    Years: 17.00    Pack years: 8.50    Types: Cigarettes  . Smokeless tobacco: Never Used  Vaping Use  . Vaping Use: Never used  Substance and Sexual Activity  . Alcohol use: No  . Drug use: No    Types: Marijuana    Comment: Former user  . Sexual activity: Not Currently  Other Topics Concern  . Not on file  Social History Narrative   ** Merged History Encounter **       Social Determinants of Health   Financial Resource Strain:   . Difficulty of Paying Living Expenses: Not on file  Food Insecurity:   . Worried About Charity fundraiser in the Last Year: Not on file  . Ran Out of Food in the Last Year: Not on file  Transportation Needs:   . Lack of Transportation (Medical): Not on file  . Lack of Transportation (Non-Medical): Not on file  Physical Activity:   . Days of Exercise per Week: Not on file  . Minutes of Exercise per Session: Not on file  Stress:   . Feeling of Stress : Not on file  Social Connections:   . Frequency of  Communication with Friends and Family: Not on file  . Frequency of Social Gatherings with Friends and Family: Not on file  . Attends Religious Services: Not on file  . Active Member of Clubs or Organizations: Not on file  . Attends Archivist Meetings: Not on file  . Marital Status: Not on file   SDOH:  SDOH Screenings   Alcohol Screen: Low Risk   . Last Alcohol Screening Score (AUDIT): 0  Depression (PHQ2-9):   . PHQ-2 Score: Not on file  Financial Resource Strain:   . Difficulty of Paying Living Expenses: Not on file  Food Insecurity:   . Worried About Charity fundraiser in the Last  Year: Not on file  . Ran Out of Food in the Last Year: Not on file  Housing:   . Last Housing Risk Score: Not on file  Physical Activity:   . Days of Exercise per Week: Not on file  . Minutes of Exercise per Session: Not on file  Social Connections:   . Frequency of Communication with Friends and Family: Not on file  . Frequency of Social Gatherings with Friends and Family: Not on file  . Attends Religious Services: Not on file  . Active Member of Clubs or Organizations: Not on file  . Attends Archivist Meetings: Not on file  . Marital Status: Not on file  Stress:   . Feeling of Stress : Not on file  Tobacco Use: High Risk  . Smoking Tobacco Use: Current Every Day Smoker  . Smokeless Tobacco Use: Never Used  Transportation Needs:   . Film/video editor (Medical): Not on file  . Lack of Transportation (Non-Medical): Not on file    Has this patient used any form of tobacco in the last 30 days? (Cigarettes, Smokeless Tobacco, Cigars, and/or Pipes) A prescription for an FDA-approved tobacco cessation medication was offered at discharge and the patient refused  Current Medications:  Current Facility-Administered Medications  Medication Dose Route Frequency Provider Last Rate Last Admin  . alum & mag hydroxide-simeth (MAALOX/MYLANTA) 200-200-20 MG/5ML suspension 30 mL   30 mL Oral Q4H PRN [name], NP      . diphenhydrAMINE (BENADRYL) capsule 50 mg  50 mg Oral TID [name], NP   50 mg at 03/11/20 2206  . magnesium hydroxide (MILK OF MAGNESIA) suspension 30 mL  30 mL Oral Daily PRN [name], NP      . metroNIDAZOLE (FLAGYL) tablet 500 mg  500 mg Oral BID [name], NP   500 mg at 03/11/20 2305  . QUEtiapine (SEROQUEL) tablet 100 mg  100 mg Oral QHS [name], NP   100 mg at 03/11/20 2206   Current Outpatient Medications  Medication Sig Dispense Refill  . ARISTADA 882 MG/3.2ML prefilled syringe Inject 882 mg into the muscle every 30 (thirty) days.    . diphenhydrAMINE (BENADRYL) 50 MG capsule Take 50 mg by mouth 3 (three) times daily.    . metroNIDAZOLE (FLAGYL) 500 MG tablet Take 500 mg by mouth 2 (two) times daily. Take two time daily for seven days    . SEROQUEL 100 MG tablet Take 100 mg by mouth at bedtime.    . ABILIFY 30 MG tablet Take 30 mg by mouth once.    . benzonatate (TESSALON) 100 MG capsule Take 1 capsule (100 mg total) by mouth every 8 (eight) hours. 21 capsule 0  . carbamazepine (TEGRETOL XR) 200 MG 12 hr tablet Take 1 tablet (200 mg total) by mouth daily. For mood stabilization 30 tablet 0  . HALDOL DECANOATE 100 MG/ML injection Inject 75 mg into the muscle every 30 (thirty) days.    . haloperidol (HALDOL) 10 MG tablet Take 1 tablet (10 mg total) by mouth at bedtime. 30 tablet 0  . hydrOXYzine (ATARAX/VISTARIL) 25 MG tablet Take 1 tablet (25 mg total) by mouth 3 (three) times daily as needed for anxiety. (Patient taking differently: Take 25 mg by mouth 3 (three) times daily. ) 75 tablet 0  . nicotine polacrilex (NICORETTE) 2 MG gum Take 1 each (2 mg total) by mouth as needed. (May buy from over the counter): For smoking  cessation (Patient not taking: Reported on 12/10/2019) 100 tablet 0  . traZODone (DESYREL) 50 MG tablet Take 1 tablet (50 mg total) by mouth at bedtime as needed for sleep. (Patient taking differently:  Take 50 mg by mouth at bedtime. ) 30 tablet 0  . trihexyphenidyl (ARTANE) 2 MG tablet Take 1 tablet (2 mg total) by mouth 2 (two) times daily with a meal. For prevention drug induced tremors. 60 tablet 0    PTA Medications: (Not in a hospital admission)   Musculoskeletal  Strength & Muscle Tone: within normal limits Gait & Station: normal Patient leans: N/A  Psychiatric Specialty Exam  Presentation  General Appearance: Appropriate for Environment;Casual  Eye Contact:Good  Speech:Clear and Coherent;Normal Rate  Speech Volume:Normal  Handedness:Right   Mood and Affect  Mood:Irritable  Affect:Appropriate;Congruent   Thought Process  Thought Processes:Coherent  Descriptions of Associations:Intact  Orientation:Full (Time, Place and Person)  Thought Content:WDL  Hallucinations:Hallucinations: Auditory Description of Auditory Hallucinations: No specifics  Ideas of Reference:None  Suicidal Thoughts:Suicidal Thoughts: No  Homicidal Thoughts:Homicidal Thoughts: No HI Passive Intent and/or Plan: Without Intent;Without Plan (expresses some homicidal thoguhts toward a neighbor that is noisey. Denies any intent or plan and states "she is more likely to kill me.")   Sensorium  Memory:Immediate Good;Recent Good;Remote Good  Judgment:Fair  Insight:Fair   Executive Functions  Concentration:Good  Attention Span:Good  [place] of Knowledge:Good  Language:Good   Psychomotor Activity  Psychomotor Activity:Psychomotor Activity: Normal   Assets  Assets:Communication Skills;Desire for Improvement;Financial Resources/Insurance;Housing;Social Support   Sleep  Sleep:Sleep: Good   Physical Exam  Physical Exam Vitals and nursing note reviewed.  Constitutional:      Appearance: She is well-developed.  HENT:     Head: Normocephalic.  Eyes:     Pupils: Pupils are equal, round, and reactive to light.  Cardiovascular:     Rate and Rhythm: Normal  rate.  Pulmonary:     Effort: Pulmonary effort is normal.  Musculoskeletal:        General: Normal range of motion.  Neurological:     Mental Status: She is alert and oriented to person, place, and time.    Review of Systems  Constitutional: Negative.   HENT: Negative.   Eyes: Negative.   Respiratory: Negative.   Cardiovascular: Negative.   Gastrointestinal: Negative.   Genitourinary: Negative.   Musculoskeletal: Negative.   Skin: Negative.   Neurological: Negative.   Endo/Heme/Allergies: Negative.   Psychiatric/Behavioral: Positive for hallucinations.   Blood pressure 137/81, pulse (!) 112, temperature 98.4 F (36.9 C), temperature source Oral, resp. rate 16, SpO2 100 %. There is no height or weight on file to calculate BMI.  Demographic Factors:  Living alone  Loss Factors: NA  Historical Factors: Prior suicide attempts  Risk Reduction Factors:   Positive social support, Positive therapeutic relationship and Positive coping skills or problem solving skills  Continued Clinical Symptoms:  Previous Psychiatric Diagnoses and Treatments  Cognitive Features That Contribute To Risk:  None    Suicide Risk:  Mild:  Suicidal ideation of limited frequency, intensity, duration, and specificity.  There are no identifiable plans, no associated intent, mild dysphoria and related symptoms, good self-control (both objective and subjective assessment), few other risk factors, and identifiable protective factors, including available and accessible social support.  Plan Of Care/Follow-up recommendations:  Continue activity as tolerated. Continue diet as recommended by your PCP. Ensure to keep all appointments with outpatient providers.  Disposition: Discharge hoe with ACT team for follow up  [facility]  B Gerda Yin, FNP 03/12/2020, 8:14 AM

## 2020-03-12 NOTE — ED Notes (Signed)
Pt alert and oriented on the unit. Education, support, and encouragement provided. Discharge summary, medications and follow up appointments reviewed with pt. Suicide prevention resources provided. Pt's belongings in locker returned. Pt denies SI/HI, A/VH, pain, or any concerns at this time. Pt ambulatory on and off unit. Pt discharged to lobby. 

## 2020-03-18 ENCOUNTER — Telehealth (HOSPITAL_COMMUNITY): Payer: Self-pay

## 2020-03-18 NOTE — Telephone Encounter (Signed)
Care Management - Follow Up [facility] Discharges   Writer spoke to the patient.   Patient reports that she already has follow up appointment scheduled.  Patient reports that if she needs assistance she will contact me.

## 2020-03-19 ENCOUNTER — Encounter (HOSPITAL_COMMUNITY): Payer: Self-pay

## 2020-03-19 ENCOUNTER — Other Ambulatory Visit: Payer: Self-pay

## 2020-03-19 ENCOUNTER — Ambulatory Visit (HOSPITAL_COMMUNITY)
Admission: EM | Admit: 2020-03-19 | Discharge: 2020-03-20 | Disposition: A | Discharge status: Home / Self Care | Payer: Medicaid Other | Attending: Nurse Practitioner | Admitting: Nurse Practitioner

## 2020-03-19 DIAGNOSIS — F259 Schizoaffective disorder, unspecified: Secondary | ICD-10-CM | POA: Diagnosis not present

## 2020-03-19 DIAGNOSIS — Z20822 Contact with and (suspected) exposure to covid-19: Secondary | ICD-10-CM | POA: Diagnosis not present

## 2020-03-19 DIAGNOSIS — R45851 Suicidal ideations: Secondary | ICD-10-CM | POA: Insufficient documentation

## 2020-03-19 LAB — POCT URINE DRUG SCREEN - MANUAL ENTRY (I-SCREEN)
POC Amphetamine UR: NOT DETECTED
POC Buprenorphine (BUP): NOT DETECTED
POC Cocaine UR: NOT DETECTED
POC Marijuana UR: POSITIVE — AB
POC Methadone UR: NOT DETECTED
POC Methamphetamine UR: NOT DETECTED
POC Morphine: NOT DETECTED
POC Oxazepam (BZO): NOT DETECTED
POC Oxycodone UR: NOT DETECTED
POC Secobarbital (BAR): NOT DETECTED

## 2020-03-19 LAB — POC SARS CORONAVIRUS 2 AG -  ED: SARS Coronavirus 2 Ag: NEGATIVE

## 2020-03-19 MED ORDER — QUETIAPINE FUMARATE 100 MG PO TABS
100.0000 mg | ORAL_TABLET | Freq: Every day | ORAL | Status: DC
  Administered 2020-03-19: 100 mg via ORAL
  Filled 2020-03-19: qty 1

## 2020-03-19 MED ORDER — TRAZODONE HCL 50 MG PO TABS
50.0000 mg | ORAL_TABLET | Freq: Every day | ORAL | Status: DC
  Administered 2020-03-19: 50 mg via ORAL
  Filled 2020-03-19: qty 1

## 2020-03-19 MED ORDER — LORAZEPAM 1 MG PO TABS
1.0000 mg | ORAL_TABLET | Freq: Once | ORAL | Status: AC
  Administered 2020-03-19: 1 mg via ORAL
  Filled 2020-03-19: qty 1

## 2020-03-19 MED ORDER — ALUM & MAG HYDROXIDE-SIMETH 200-200-20 MG/5ML PO SUSP: 30.0000 mL | ORAL | Status: DC | PRN

## 2020-03-19 MED ORDER — MAGNESIUM HYDROXIDE 400 MG/5ML PO SUSP: 30.0000 mL | Freq: Every day | ORAL | Status: DC | PRN

## 2020-03-19 NOTE — ED Triage Notes (Signed)
Patient arrives via [employer]. Initially patient is mute when this RN asks why she was brought in. Patient does verbally respond to MHT during vitals with 'something is wrong'. Patient alert, not oriented to date. Calm & mostly cooperative, in no acute distress.

## 2020-03-19 NOTE — BH Assessment (Signed)
Comprehensive Clinical Assessment (CCA) Note  03/19/2020 [name] [id number]  Chief Complaint:  Chief Complaint  Patient presents with   Schizophrenia   Visit Diagnosis:  F20.9 Schizophrenia  Flavia is a 49 yo female transported by [employer] to [facility] for assessment of suicidal ideation. Pt is unable to articulate circumstances prior to arrival at [facility]. Pt reports that she is having SI with no specific plan, HI towards others that are trying to hurt her, and is experiencing AVH at time of assessment. Pt denies any substance use at time of assessment.   Pt reports that she just had an appointment with Dr. [name] (psychiatrist, ACTT team) today. Pt denies any medication administration or prescription changes at appointment today. Pt admits that psychiatrist gave prescription for Klonipin, but pt has not gotten it filled. Pt presenting with very flat affect, eyes staring, and pt seemed to be responding to internal stimuli throughout assessment. Pt states that she is not sleeping well and is unsure when she slept through the night last I sleep in the daytime a lot.  Pt reports that she feels she is a danger to herself.   Pt reports that she currently lives alone and does not have an immediate support system. Pts mental state includes poor insight and observing capacity, poor reflective capacity, and impaired judgement.   Jeanmarie Plant, MSW, LCSW Outpatient Therapist/Triage Specialist  Disposition: Per Lindon Romp, NP pt to be observed overnight [facility] and reassessed in the morning  CCA Screening, Triage and Referral (STR)  Patient Reported Information How did you hear about [place]? Legal System  Referral name: [employer]--pt unable to articulate circumstances prior to arrival  Referral phone number: No data recorded  Whom do you see for routine medical problems? Hospital ER  Practice/Facility Name: [facility]  Practice/Facility Phone Number: No data recorded Name of Contact: No data  recorded Contact Number: No data recorded Contact Fax Number: No data recorded Prescriber Name: Varies  Prescriber Address (if known): n/a   What Is the Reason for Your Visit/Call Today? hallucinations--visual and auditory.  "I am hearing my friends all around me"  How Long Has This Been Causing You Problems? 1 wk - 1 month  What Do You Feel Would Help You the Most Today? Assessment Only;Other (Comment) (observation/stabilization)   Have You Recently Been in Any Inpatient Treatment (Hospital/Detox/Crisis Center/28-Day Program)? No  Name/Location of Program/Hospital:[facility]  How Long Were You There? 3 days  When Were You Discharged? 11/30/19   Have You Ever Received Services From [employer] Before? Yes  Who Do You See at [facility]? Pt has seen Dr. Mallie Darting when inpt at [place] Recently Had Any Thoughts About [place]? Yes (pt reports suicidal ideation--thoughts with no specific plans)  Are You Planning to Commit Suicide/Harm Yourself At This time? No   Have you Recently Had Thoughts About [place]? No  Explanation: No data recorded  Have You Used Any Alcohol or Drugs in the Past 24 Hours? No  How Long Ago Did You Use Drugs or Alcohol? No data recorded What Did You Use and How Much? No data recorded  Do You Currently Have a Therapist/Psychiatrist? Yes  Name of Therapist/Psychiatrist: Dr. [name]; sees through ACT team   Have You Been Recently Discharged From Any Office Practice or Programs? No  Explanation of Discharge From Practice/Program: No data recorded    CCA Screening Triage Referral Assessment Type of Contact: Face-to-Face  Is this Initial or Reassessment? No data recorded  Date Telepsych consult ordered in [facility]:  No data recorded Time Telepsych consult ordered in [facility]:  No data recorded  Patient Reported Information Reviewed? Yes  Patient Left Without Being Seen? No data recorded Reason for Not Completing Assessment: No data  recorded  Collateral Involvement: Pt consents to contact mother, if necessary   Does Patient Have a Court Appointed Legal Guardian? No data recorded Name and Contact of Legal Guardian: No data recorded If Minor and Not Living with Parent(s), Who has Custody? n/a  Is CPS involved or ever been involved? Never  Is APS involved or ever been involved? Never   Patient Determined To Be At Risk for Harm To Self or Others Based on Review of Patient Reported Information or Presenting Complaint? No  Method: No data recorded Availability of Means: No data recorded Intent: No data recorded Notification Required: No data recorded Additional Information for Danger to Others Potential: No data recorded Additional Comments for Danger to Others Potential: No data recorded Are There Guns or Other Weapons in Your Home? No data recorded Types of Guns/Weapons: No data recorded Are These Weapons Safely Secured?                            No data recorded Who Could Verify You Are Able To Have These Secured: No data recorded Do You Have any Outstanding Charges, Pending Court Dates, Parole/Probation? No data recorded Contacted To Inform of Risk of Harm To Self or Others: No data recorded  Location of Assessment: [facility]   Does Patient Present under Involuntary Commitment? No  IVC Papers Initial File Date: No data recorded  [place] of Residence: [place]   Patient Currently Receiving the Following Services: ACTT Architect);Medication Management   Determination of Need: Emergent (2 hours)   Options For Referral: [facility] (overnight observation)     CCA Biopsychosocial Intake/Chief Complaint:  Eyanna is a 49 yo female transported by [employer] to [facility] for assessment of suicidal ideation. Pt is unable to articulate circumstances prior to arrival at [facility]. Pt reports that she is having SI with no specific plan, HI towards others that are trying to hurt her,  and is experiencing AVH at time of assessment. Pt denies any substance use at time of assessment. Pt reports that she just had an appointment with Dr. [name] (psychiatrist, ACTT team) today. Pt denies any medication administration or prescription changes at appointment today. Pt admits that psychiatrist gave prescription for Klonipin, but pt has not gotten it filled. Pt presenting with very flat affect, eyes staring, and pt seemed to be responding to internal stimuli throughout assessment. Pt states that she is not sleeping well and is unsure when she slept through the night last I sleep in the daytime a lot.  Pt reports that she feels she is a danger to herself. Pt reports that she currently lives alone and does not have an immediate support system. Pts mental state includes poor insight and observing capacity, poor reflective capacity, and impaired judgement.  Current Symptoms/Problems: Pt states she is experiencing AVH.   Patient Reported Schizophrenia/Schizoaffective Diagnosis in Past: Yes   Strengths: Pt is able to identify that she needs assistance with her mental health.  Preferences: Pt is requesting to have a change in her medication, would stay in the [facility]/hospital of this was offered.  Abilities: Pt lives independently. She states she takes her medication as prescribed.   Type of Services Patient  Feels are Needed: Pt states she receives the services that are best for her but needs her medications reviewed.   Initial Clinical Notes/Concerns: N/A   Mental Health Symptoms Depression:  Difficulty Concentrating;Hopelessness   Duration of Depressive symptoms: Greater than two weeks   Mania:  None   Anxiety:   Tension;Worrying   Psychosis:  Hallucinations   Duration of Psychotic symptoms: Less than six months   Trauma:  None   Obsessions:  None   Compulsions:  None   Inattention:  None   Hyperactivity/Impulsivity:  N/A   Oppositional/Defiant Behaviors:  None    Emotional Irregularity:  None   Other Mood/Personality Symptoms:  None noted    Mental Status Exam Appearance and self-care  Stature:  Average   Weight:  Average weight   Clothing:  Casual   Grooming:  Well-groomed   Cosmetic use:  Age appropriate   Posture/gait:  Normal   Motor activity:  Tremor (slight head tremor)   Sensorium  Attention:  Confused   Concentration:  Scattered   Orientation:  Person   Recall/memory:  Defective in Immediate;Defective in Short-term;Defective in Recent;Defective in Remote   Affect and Mood  Affect:  Blunted;Flat   Mood:  Depressed   Relating  Eye contact:  Staring   Facial expression:  Constricted;Depressed   Attitude toward examiner:  Cooperative (pt tried hard to articulate needs to clinicians)   Thought and Language  Speech flow: Blocked   Thought content:  Delusions   Preoccupation:  None   Hallucinations:  Auditory;Visual   Organization:  No data recorded  [employer] of Knowledge:  Average   Intelligence:  Average   Abstraction:  Normal   Judgement:  Impaired   Reality Testing:  Distorted   Insight:  Gaps;Unaware   Decision Making:  Confused   Social Functioning  Social Maturity:  Impulsive   Social Judgement:  Heedless   Stress  Stressors:  Housing   Coping Ability:  Overwhelmed   Skill Deficits:  Interpersonal;Self-control   Supports:  Family;Friends/Service system     CCA Substance Use Alcohol/Drug Use: Alcohol / Drug Use Pain Medications: Please see MAR Prescriptions: Please see MAR Over the Counter: Please see MAR History of alcohol / drug use?: No history of alcohol / drug abuse (pt denies at time of assessment)    ASAM's:  Six Dimensions of Multidimensional Assessment  Dimension 1:  Acute Intoxication and/or Withdrawal Potential:   Dimension 1:  Description of individual's past and current experiences of substance use and withdrawal: pt denies at time of  assessment--previous history of cocaine use  Dimension 2:  Biomedical Conditions and Complications:      Dimension 3:  Emotional, Behavioral, or Cognitive Conditions and Complications:     Dimension 4:  Readiness to Change:     Dimension 5:  Relapse, Continued use, or Continued Problem Potential:     Dimension 6:  Recovery/Living Environment:     ASAM Severity Score: ASAM's Severity Rating Score: 0  ASAM Recommended Level of Treatment:     Substance use Disorder (SUD)    Recommendations for Services/Supports/Treatments: Recommendations for Services/Supports/Treatments Recommendations For Services/Supports/Treatments: Other (Comment) ([place] overnight observation)  DSM5 Diagnoses: Patient Active Problem List   Diagnosis Date Noted   Schizophrenia ([place]) 02/07/2019   Schizoaffective disorder ([place]) 11/08/2018   No diagnosis on Axis I 08/21/2015   Abnormal behavior    Involuntary commitment    Psychosis ([place])    Disturbance in affect    Tobacco  use disorder 05/12/2015   Cannabis use disorder, moderate, dependence ([place]) 05/12/2015   Schizo affective schizophrenia ([place]) 05/10/2015   Hypertension 02/24/2011   ANEMIA-IRON DEFICIENCY 12/13/2006   GERD 12/13/2006   OBESITY 12/12/2006   Schizoaffective disorder, bipolar type ([place]) 12/12/2006   HEMORRHOIDS, NOS 12/12/2006   PAIN-NECK 12/12/2006   Abnormal involuntary movement 12/12/2006    Referrals to Alternative Service(s): Referred to Alternative Service(s):   Place:   Date:   Time:    Referred to Alternative Service(s):   Place:   Date:   Time:    Referred to Alternative Service(s):   Place:   Date:   Time:    Referred to Alternative Service(s):   Place:   Date:   Time:     Rachel Bo Santana Edell, LCSW

## 2020-03-19 NOTE — ED Provider Notes (Signed)
Behavioral Health Admission [facility]&P [facility] & [facility]BS)  Date: 03/19/20 Patient Name: [name] MRN: [id number] Chief Complaint:  Chief Complaint  Patient presents with  . Schizophrenia   Chief Complaint/Presenting Problem: [name] is a 49 yo female transported by [employer] to [facility] for assessment of suicidal ideation. Pt is unable to articulate circumstances prior to arrival at [facility]. Pt reports that she is having SI with no specific plan, HI towards others that are trying to hurt her, and is experiencing AVH at time of assessment. Pt denies any substance use at time of assessment. Pt reports that she just had an appointment with Dr. [name] (psychiatrist, ACTT team) today. Pt denies any medication administration or prescription changes at appointment today. Pt admits that psychiatrist gave prescription for Klonipin, but pt has not gotten it filled. Pt presenting with very flat affect, eyes staring, and pt seemed to be responding to internal stimuli throughout assessment. Pt states that she is not sleeping well and is unsure when she slept through the night last "I sleep in the daytime a lot".  Pt reports that she feels she is a danger to herself. Pt reports that she currently lives alone and does not have an immediate support system. Pts mental state includes poor insight and observing capacity, poor reflective capacity, and impaired judgement.  Diagnoses:  Final diagnoses:  Schizoaffective disorder, unspecified type ([place])  Suicidal ideation    HPI: [name] is a a 49 y.o. female with a history of schizoaffective disorder who presents to [facility] voluntarily with law enforcement due to [place]. She states that she is suicidal. She denies a plan. She states that she contacted law enforcement because  "I am tired of living like this, I can't take it anymore." She states that she is afraid to return home because she may harm herself and "I'm scared the people are going to hurt me."   Patient is alert and oriented x 3.  She stares intently at provider after each question. She appears to have difficulty processing and answers questions after several seconds. She eventually states "I'm sorry, but I am having to think about what you are saying, because the voices are also talking, and I want to make sure I am answering you appropriately." When asked about AVH, patient states that she does not hear voices or see things. However, she again mentioned the voices talking to her later during the assessment. She states the voices are telling her that that they are going to "knock out my windows and shoot me." Patient states that people are out to get her. When asked about these people, she states that she has never seen them, but "I can feel them hitting me and doing sexual things."   Patient reports that she was seen by Dr. [name] today and that he prescribed klonopin, but she has not had it filled. She denies any other medication changes since she was at [facility] on 03/11/2020. Patient states "I am on too many medications, I don't want to try anymore." On chart review, it is noted that Dr. [name] expressed some concerns to [facility] provider on 03/12/20 that the patient may have relapsed on cocaine, but was refusing drug screens for the ACT Team. UDS this evening is positive for marijuana, but otherwise negative.   When discussing overnight observation, patient became irritable. She states "just overnight, but what I am going to do tomorrow when I go home and these people are still trying to hurt me." Discussed reevaluating her tomorrow  and developing a plan for discharge or inpatient treatment at that point. She became tearful and continued to ruminate on not feeling safe at home due to "people trying to get me."   PHQ 2-9:     ED from 03/19/2020 in [facility] Admission (Discharged) from 11/28/2019 in [place] [facility] ED from 11/27/2019 in [place] No Risk Low Risk Low Risk       Total Time spent with patient: 30 minutes  Musculoskeletal  Strength & Muscle Tone: within normal limits Gait & Station: normal Patient leans: N/A  Psychiatric Specialty Exam  Presentation General Appearance: Fairly Groomed  Eye Contact:Good  Speech:Blocked  Speech Volume:Normal  Handedness:Right   Mood and Affect  Mood:Depressed;Anxious  Affect:Congruent;Tearful   Thought Process  Thought Processes:Coherent  Descriptions of Associations:Intact  Orientation:Full (Time, Place and Person)  Thought Content:Delusions;Paranoid Ideation  Hallucinations:Hallucinations: Auditory Description of Auditory Hallucinations: voices tell her that they are going to break her windows, shoot her  Ideas of Reference:Delusions;Paranoia  Suicidal Thoughts:Suicidal Thoughts: Yes, Passive SI Passive Intent and/or Plan: Without Intent;Without Plan  Homicidal Thoughts:Homicidal Thoughts: Yes, Passive HI Passive Intent and/or Plan: Without Intent;Without Plan   Sensorium  Memory:Immediate Fair;Recent Fair;Remote Fair  Judgment:Impaired  Insight:Fair   Executive Functions  Concentration:Fair  Attention Span:Fair  [place] of Knowledge:Good  Language:Fair   Psychomotor Activity  Psychomotor Activity:Psychomotor Activity: Tremor   Assets  Assets:Desire for Improvement;Financial Resources/Insurance;Housing;Physical Health;Social Support   Sleep  Sleep:Sleep: Fair   Physical Exam Vitals and nursing note reviewed.  Constitutional:      General: She is not in acute distress.    Appearance: She is well-developed. She is not ill-appearing, toxic-appearing or diaphoretic.  HENT:     Head: Normocephalic and atraumatic.  Eyes:     Conjunctiva/sclera: Conjunctivae normal.  Cardiovascular:     Rate and Rhythm: Normal rate and regular rhythm.     Heart sounds: No murmur heard.   Pulmonary:      Effort: Pulmonary effort is normal. No respiratory distress.     Breath sounds: Normal breath sounds.  Abdominal:     Palpations: Abdomen is soft.     Tenderness: There is no abdominal tenderness.  Musculoskeletal:     Cervical back: Neck supple.  Skin:    General: Skin is warm and dry.  Neurological:     Mental Status: She is alert.    Review of Systems  Constitutional: Negative for chills, diaphoresis, fever, malaise/fatigue and weight loss.  HENT: Negative for congestion.   Respiratory: Negative for cough and shortness of breath.   Cardiovascular: Negative for chest pain and palpitations.  Gastrointestinal: Negative for diarrhea, nausea and vomiting.  Neurological: Negative for dizziness and seizures.  Psychiatric/Behavioral: Positive for depression, hallucinations and suicidal ideas. Negative for memory loss. The patient is nervous/anxious and has insomnia.   All other systems reviewed and are negative.   Blood pressure (!) 152/99, pulse 86, temperature 97.7 F (36.5 C), temperature source Tympanic, resp. rate 20, SpO2 99 %. There is no height or weight on file to calculate BMI.  Past Psychiatric History: Schizoaffective  Is the patient at risk to self? Yes  Has the patient been a risk to self in the past 6 months? Yes .    Has the patient been a risk to self within the distant past? Yes   Is the patient a risk to others? No   Has the  patient been a risk to others in the past 6 months? No   Has the patient been a risk to others within the distant past? No   Past Medical History:  Past Medical History:  Diagnosis Date  . Arthritis   . Bipolar 1 disorder ([place])   . Bronchitis   . Depression   . Hemorrhoid   . Schizoaffective disorder ([place])   . Schizophrenia ([place])    Pt denies and reports it is schizoaffective disorder.   . Transfusion history     Past Surgical History:  Procedure Laterality Date  . CESAREAN SECTION      Family History:  Family History  Problem  Relation Age of Onset  . Diabetes type II Other     Social History:  Social History   Socioeconomic History  . Marital status: Single    Spouse name: Not on file  . Number of children: Not on file  . Years of education: Not on file  . Highest education level: Not on file  Occupational History  . Occupation: unemployed  Tobacco Use  . Smoking status: Current Every Day Smoker    Packs/day: 0.50    Years: 17.00    Pack years: 8.50    Types: Cigarettes  . Smokeless tobacco: Never Used  Vaping Use  . Vaping Use: Never used  Substance and Sexual Activity  . Alcohol use: No  . Drug use: No    Types: Marijuana    Comment: Former user  . Sexual activity: Not Currently  Other Topics Concern  . Not on file  Social History Narrative   ** Merged History Encounter **       Social Determinants of Health   Financial Resource Strain:   . Difficulty of Paying Living Expenses: Not on file  Food Insecurity:   . Worried About Charity fundraiser in the Last Year: Not on file  . Ran Out of Food in the Last Year: Not on file  Transportation Needs:   . Lack of Transportation (Medical): Not on file  . Lack of Transportation (Non-Medical): Not on file  Physical Activity:   . Days of Exercise per Week: Not on file  . Minutes of Exercise per Session: Not on file  Stress:   . Feeling of Stress : Not on file  Social Connections:   . Frequency of Communication with Friends and Family: Not on file  . Frequency of Social Gatherings with Friends and Family: Not on file  . Attends Religious Services: Not on file  . Active Member of Clubs or Organizations: Not on file  . Attends Archivist Meetings: Not on file  . Marital Status: Not on file  Intimate Partner Violence:   . Fear of Current or Ex-Partner: Not on file  . Emotionally Abused: Not on file  . Physically Abused: Not on file  . Sexually Abused: Not on file    SDOH:  SDOH Screenings   Alcohol Screen: Low Risk   .  Last Alcohol Screening Score (AUDIT): 0  Depression (PHQ2-9):   . PHQ-2 Score: Not on file  Financial Resource Strain:   . Difficulty of Paying Living Expenses: Not on file  Food Insecurity:   . Worried About Charity fundraiser in the Last Year: Not on file  . Ran Out of Food in the Last Year: Not on file  Housing:   . Last Housing Risk Score: Not on file  Physical Activity:   . Days of  Exercise per Week: Not on file  . Minutes of Exercise per Session: Not on file  Social Connections:   . Frequency of Communication with Friends and Family: Not on file  . Frequency of Social Gatherings with Friends and Family: Not on file  . Attends Religious Services: Not on file  . Active Member of Clubs or Organizations: Not on file  . Attends Archivist Meetings: Not on file  . Marital Status: Not on file  Stress:   . Feeling of Stress : Not on file  Tobacco Use: High Risk  . Smoking Tobacco Use: Current Every Day Smoker  . Smokeless Tobacco Use: Never Used  Transportation Needs:   . Film/video editor (Medical): Not on file  . Lack of Transportation (Non-Medical): Not on file    Last Labs:  Admission on 03/11/2020, Discharged on 03/12/2020  Component Date Value Ref Range Status  . WBC 03/11/2020 11.1* 4.0 - 10.5 K/uL Final  . RBC 03/11/2020 4.10  3.87 - 5.11 MIL/uL Final  . Hemoglobin 03/11/2020 10.0* 12.0 - 15.0 g/dL Final  . HCT 03/11/2020 32.4* 36 - 46 % Final  . MCV 03/11/2020 79.0* 80.0 - 100.0 fL Final  . MCH 03/11/2020 24.4* 26.0 - 34.0 pg Final  . MCHC 03/11/2020 30.9  30.0 - 36.0 g/dL Final  . RDW 03/11/2020 16.7* 11.5 - 15.5 % Final  . Platelets 03/11/2020 401* 150 - 400 K/uL Final  . nRBC 03/11/2020 0.0  0.0 - 0.2 % Final  . Neutrophils Relative % 03/11/2020 64  % Final  . Neutro Abs 03/11/2020 7.1  1.7 - 7.7 K/uL Final  . Lymphocytes Relative 03/11/2020 30  % Final  . Lymphs Abs 03/11/2020 3.3  0.7 - 4.0 K/uL Final  . Monocytes Relative 03/11/2020 4  %  Final  . Monocytes Absolute 03/11/2020 0.5  0.1 - 1.0 K/uL Final  . Eosinophils Relative 03/11/2020 1  % Final  . Eosinophils Absolute 03/11/2020 0.1  0.0 - 0.5 K/uL Final  . Basophils Relative 03/11/2020 0  % Final  . Basophils Absolute 03/11/2020 0.0  0.0 - 0.1 K/uL Final  . Immature Granulocytes 03/11/2020 1  % Final  . Abs Immature Granulocytes 03/11/2020 0.06  0.00 - 0.07 K/uL Final   Performed at [facility], [place] [address] [place], [place] [address]  . Sodium 03/11/2020 134* 135 - 145 mmol/L Final  . Potassium 03/11/2020 3.7  3.5 - 5.1 mmol/L Final  . Chloride 03/11/2020 101  98 - 111 mmol/L Final  . CO2 03/11/2020 23  22 - 32 mmol/L Final  . Glucose, Bld 03/11/2020 125* 70 - 99 mg/dL Final   Glucose reference range applies only to samples taken after fasting for at least 8 hours.  . BUN 03/11/2020 7  6 - 20 mg/dL Final  . Creatinine, Ser 03/11/2020 0.94  0.44 - 1.00 mg/dL Final  . Calcium 03/11/2020 8.7* 8.9 - 10.3 mg/dL Final  . Total Protein 03/11/2020 8.4* 6.5 - 8.1 g/dL Final  . Albumin 03/11/2020 3.7  3.5 - 5.0 g/dL Final  . AST 03/11/2020 11* 15 - 41 U/L Final  . ALT 03/11/2020 7  0 - 44 U/L Final  . Alkaline Phosphatase 03/11/2020 74  38 - 126 U/L Final  . Total Bilirubin 03/11/2020 0.3  0.3 - 1.2 mg/dL Final  . GFR, Estimated 03/11/2020 >60  >60 mL/min Final   Comment: (NOTE) Calculated using the CKD-EPI Creatinine Equation (2021)   . Anion gap 03/11/2020  10  5 - 15 Final   Performed at [facility], [place] [address], [place], [place] [address]  . SARS Coronavirus 2 Ag 03/11/2020 Negative  Negative Preliminary  . SARS Coronavirus 2 by RT PCR 03/11/2020 NEGATIVE  NEGATIVE Final   Comment: (NOTE) SARS-CoV-2 target nucleic acids are NOT DETECTED.  The SARS-CoV-2 RNA is generally detectable in upper respiratoy specimens during the acute phase of infection. The lowest concentration of SARS-CoV-2 viral copies this assay can detect is 131 copies/mL. A  negative result does not preclude SARS-Cov-2 infection and should not be used as the sole basis for treatment or other patient management decisions. A negative result may occur with  improper specimen collection/handling, submission of specimen other than nasopharyngeal swab, presence of viral mutation(s) within the areas targeted by this assay, and inadequate number of viral copies (<131 copies/mL). A negative result must be combined with clinical observations, patient history, and epidemiological information. The expected result is Negative.  Fact Sheet for Patients:  PinkCheek.be  Fact Sheet for Healthcare Providers:  GravelBags.it  This test is no                          t yet approved or cleared by the [place] FDA and  has been authorized for detection and/or diagnosis of SARS-CoV-2 by FDA under an Emergency Use Authorization (EUA). This EUA will remain  in effect (meaning this test can be used) for the duration of the COVID-19 declaration under Section 564(b)(1) of the Act, 21 U.S.C. section 360bbb-3(b)(1), unless the authorization is terminated or revoked sooner.    . Influenza A by PCR 03/11/2020 NEGATIVE  NEGATIVE Final  . Influenza B by PCR 03/11/2020 NEGATIVE  NEGATIVE Final   Comment: (NOTE) The Xpert Xpress SARS-CoV-2/FLU/RSV assay is intended as an aid in  the diagnosis of influenza from Nasopharyngeal swab specimens and  should not be used as a sole basis for treatment. Nasal washings and  aspirates are unacceptable for Xpert Xpress SARS-CoV-2/FLU/RSV  testing.  Fact Sheet for Patients: PinkCheek.be  Fact Sheet for Healthcare Providers: GravelBags.it  This test is not yet approved or cleared by the [place] FDA and  has been authorized for detection and/or diagnosis of SARS-CoV-2 by  FDA under an Emergency Use Authorization (EUA). This  EUA will remain  in effect (meaning this test can be used) for the duration of the  Covid-19 declaration under Section 564(b)(1) of the Act, 21  U.S.C. section 360bbb-3(b)(1), unless the authorization is  terminated or revoked. Performed at [facility], [place] [address] [place], [place] [address]   Admission on 02/23/2020, Discharged on 02/23/2020  Component Date Value Ref Range Status  . SARS Coronavirus 2 by RT PCR 02/23/2020 NEGATIVE  NEGATIVE Final   Comment: (NOTE) SARS-CoV-2 target nucleic acids are NOT DETECTED.  The SARS-CoV-2 RNA is generally detectable in upper respiratoy specimens during the acute phase of infection. The lowest concentration of SARS-CoV-2 viral copies this assay can detect is 131 copies/mL. A negative result does not preclude SARS-Cov-2 infection and should not be used as the sole basis for treatment or other patient management decisions. A negative result may occur with  improper specimen collection/handling, submission of specimen other than nasopharyngeal swab, presence of viral mutation(s) within the areas targeted by this assay, and inadequate number of viral copies (<131 copies/mL). A negative result must be combined with clinical observations, patient history, and epidemiological information. The expected result  is Negative.  Fact Sheet for Patients:  PinkCheek.be  Fact Sheet for Healthcare Providers:  GravelBags.it  This test is no                          t yet approved or cleared by the [place] FDA and  has been authorized for detection and/or diagnosis of SARS-CoV-2 by FDA under an Emergency Use Authorization (EUA). This EUA will remain  in effect (meaning this test can be used) for the duration of the COVID-19 declaration under Section 564(b)(1) of the Act, 21 U.S.C. section 360bbb-3(b)(1), unless the authorization is terminated or revoked sooner.    . Influenza A by  PCR 02/23/2020 NEGATIVE  NEGATIVE Final  . Influenza B by PCR 02/23/2020 NEGATIVE  NEGATIVE Final   Comment: (NOTE) The Xpert Xpress SARS-CoV-2/FLU/RSV assay is intended as an aid in  the diagnosis of influenza from Nasopharyngeal swab specimens and  should not be used as a sole basis for treatment. Nasal washings and  aspirates are unacceptable for Xpert Xpress SARS-CoV-2/FLU/RSV  testing.  Fact Sheet for Patients: PinkCheek.be  Fact Sheet for Healthcare Providers: GravelBags.it  This test is not yet approved or cleared by the [place] FDA and  has been authorized for detection and/or diagnosis of SARS-CoV-2 by  FDA under an Emergency Use Authorization (EUA). This EUA will remain  in effect (meaning this test can be used) for the duration of the  Covid-19 declaration under Section 564(b)(1) of the Act, 21  U.S.C. section 360bbb-3(b)(1), unless the authorization is  terminated or revoked. Performed at [facility], [place] [address], [place], [place] [address]   Admission on 01/03/2020, Discharged on 01/03/2020  Component Date Value Ref Range Status  . Color, Urine 01/03/2020 YELLOW  YELLOW Final  . APPearance 01/03/2020 TURBID* CLEAR Final  . Specific Gravity, Urine 01/03/2020 1.025  1.005 - 1.030 Final  . pH 01/03/2020 5.0  5.0 - 8.0 Final  . Glucose, UA 01/03/2020 NEGATIVE  NEGATIVE mg/dL Final  . Hgb urine dipstick 01/03/2020 NEGATIVE  NEGATIVE Final  . Bilirubin Urine 01/03/2020 NEGATIVE  NEGATIVE Final  . Ketones, ur 01/03/2020 NEGATIVE  NEGATIVE mg/dL Final  . Protein, ur 01/03/2020 30* NEGATIVE mg/dL Final  . Nitrite 01/03/2020 NEGATIVE  NEGATIVE Final  . Leukocytes,Ua 01/03/2020 MODERATE* NEGATIVE Final  . WBC, UA 01/03/2020 6-10  0 - 5 WBC/hpf Final  . Bacteria, UA 01/03/2020 RARE* NONE SEEN Final  . Squamous Epithelial / LPF 01/03/2020 11-20  0 - 5 Final  . Amorphous Crystal 01/03/2020  PRESENT   Final   Performed at [facility], [place] [address], [place], [place] [address]  Admission on 11/28/2019, Discharged on 11/30/2019  Component Date Value Ref Range Status  . Vitamin B-12 11/29/2019 221  180 - 914 pg/mL Final   Comment: (NOTE) This assay is not validated for testing neonatal or myeloproliferative syndrome specimens for Vitamin B12 levels. Performed at [facility], [place] [address] [place], [place] [address]   . Carbamazepine Lvl 11/29/2019 2.9* 4.0 - 12.0 ug/mL Final   Performed at [place] [address], [place], [place] [address]  . Color, Urine 11/29/2019 YELLOW  YELLOW Final  . APPearance 11/29/2019 HAZY* CLEAR Final  . Specific Gravity, Urine 11/29/2019 1.014  1.005 - 1.030 Final  . pH 11/29/2019 6.0  5.0 - 8.0 Final  . Glucose, UA 11/29/2019 NEGATIVE  NEGATIVE mg/dL Final  . Hgb  urine dipstick 11/29/2019 MODERATE* NEGATIVE Final  . Bilirubin Urine 11/29/2019 NEGATIVE  NEGATIVE Final  . Ketones, ur 11/29/2019 NEGATIVE  NEGATIVE mg/dL Final  . Protein, ur 11/29/2019 NEGATIVE  NEGATIVE mg/dL Final  . Nitrite 11/29/2019 NEGATIVE  NEGATIVE Final  . Chalmers Guest 11/29/2019 LARGE* NEGATIVE Final  . RBC / HPF 11/29/2019 21-50  0 - 5 RBC/hpf Final  . WBC, UA 11/29/2019 21-50  0 - 5 WBC/hpf Final  . Bacteria, UA 11/29/2019 FEW* NONE SEEN Final  . Squamous Epithelial / LPF 11/29/2019 6-10  0 - 5 Final   Performed at [facility], [place] [address], [place], [place] [address]  . Ferritin 11/30/2019 12  11 - 307 ng/mL Final   Performed at [place] [address] [place], [place] [address]  . Folate 11/30/2019 5.9* >5.9 ng/mL Final   Performed at [place] [address] [place], [place] [address]  . Total Protein 11/30/2019 7.6  6.5 - 8.1 g/dL Final  . Albumin 11/30/2019 4.2  3.5 - 5.0 g/dL Final  . AST 11/30/2019 17  15 - 41 U/L Final  . ALT  11/30/2019 9  0 - 44 U/L Final  . Alkaline Phosphatase 11/30/2019 86  38 - 126 U/L Final  . Total Bilirubin 11/30/2019 0.3  0.3 - 1.2 mg/dL Final  . Bilirubin, Direct 11/30/2019 <0.1  0.0 - 0.2 mg/dL Final  . Indirect Bilirubin 11/30/2019 NOT CALCULATED  0.3 - 0.9 mg/dL Final   Performed at [facility], [place] [address], [place], [place] [address]  . Iron 11/30/2019 28  28 - 170 ug/dL Final  . TIBC 11/30/2019 413  250 - 450 ug/dL Final  . Saturation Ratios 11/30/2019 7* 10.4 - 31.8 % Final  . UIBC 11/30/2019 385  ug/dL Final   Performed at [facility], [place] [address], [place], [place] [address]  . Retic Ct Pct 11/30/2019 1.2  0.4 - 3.1 % Final  . RBC. 11/30/2019 3.58* 3.87 - 5.11 MIL/uL Final  . Retic Count, Absolute 11/30/2019 44.4  19.0 - 186.0 K/uL Final  . Immature Retic Fract 11/30/2019 20.9* 2.3 - 15.9 % Final   Performed at [facility], [place] [address] [place], [place] [address]  Admission on 11/27/2019, Discharged on 11/28/2019  Component Date Value Ref Range Status  . SARS Coronavirus 2 11/27/2019 NEGATIVE  NEGATIVE Final   Comment: (NOTE) SARS-CoV-2 target nucleic acids are NOT DETECTED.  The SARS-CoV-2 RNA is generally detectable in upper and lower respiratory specimens during the acute phase of infection. The lowest concentration of SARS-CoV-2 viral copies this assay can detect is 250 copies / mL. A negative result does not preclude SARS-CoV-2 infection and should not be used as the sole basis for treatment or other patient management decisions.  A negative result may occur with improper specimen collection / handling, submission of specimen other than nasopharyngeal swab, presence of viral mutation(s) within the areas targeted by this assay, and inadequate number of viral copies (<250 copies / mL). A negative result must be combined with clinical observations, patient history, and epidemiological  information.  Fact Sheet for Patients:   StrictlyIdeas.no  Fact Sheet for Healthcare Providers: BankingDealers.co.za  This test is not yet approved or                           cleared by the [place] FDA and has been authorized for detection  and/or diagnosis of SARS-CoV-2 by FDA under an Emergency Use Authorization (EUA).  This EUA will remain in effect (meaning this test can be used) for the duration of the COVID-19 declaration under Section 564(b)(1) of the Act, 21 U.S.C. section 360bbb-3(b)(1), unless the authorization is terminated or revoked sooner.  Performed at [facility], [place] [address] [place], [place] [address]   . WBC 11/27/2019 11.3* 4.0 - 10.5 K/uL Final  . RBC 11/27/2019 3.69* 3.87 - 5.11 MIL/uL Final  . Hemoglobin 11/27/2019 9.0* 12.0 - 15.0 g/dL Final  . HCT 11/27/2019 29.0* 36 - 46 % Final  . MCV 11/27/2019 78.6* 80.0 - 100.0 fL Final  . MCH 11/27/2019 24.4* 26.0 - 34.0 pg Final  . MCHC 11/27/2019 31.0  30.0 - 36.0 g/dL Final  . RDW 11/27/2019 18.1* 11.5 - 15.5 % Final  . Platelets 11/27/2019 362  150 - 400 K/uL Final  . nRBC 11/27/2019 0.0  0.0 - 0.2 % Final  . Neutrophils Relative % 11/27/2019 56  % Final  . Neutro Abs 11/27/2019 6.3  1.7 - 7.7 K/uL Final  . Lymphocytes Relative 11/27/2019 37  % Final  . Lymphs Abs 11/27/2019 4.2* 0.7 - 4.0 K/uL Final  . Monocytes Relative 11/27/2019 5  % Final  . Monocytes Absolute 11/27/2019 0.6  0.1 - 1.0 K/uL Final  . Eosinophils Relative 11/27/2019 1  % Final  . Eosinophils Absolute 11/27/2019 0.1  0.0 - 0.5 K/uL Final  . Basophils Relative 11/27/2019 1  % Final  . Basophils Absolute 11/27/2019 0.1  0.0 - 0.1 K/uL Final  . Immature Granulocytes 11/27/2019 0  % Final  . Abs Immature Granulocytes 11/27/2019 0.04  0.00 - 0.07 K/uL Final   Performed at [facility], [place] [address] [place], [place] [address]  . Sodium 11/27/2019 135  135 - 145 mmol/L  Final  . Potassium 11/27/2019 3.4* 3.5 - 5.1 mmol/L Final  . Chloride 11/27/2019 102  98 - 111 mmol/L Final  . CO2 11/27/2019 21* 22 - 32 mmol/L Final  . Glucose, Bld 11/27/2019 88  70 - 99 mg/dL Final   Glucose reference range applies only to samples taken after fasting for at least 8 hours.  . BUN 11/27/2019 7  6 - 20 mg/dL Final  . Creatinine, Ser 11/27/2019 0.81  0.44 - 1.00 mg/dL Final  . Calcium 11/27/2019 9.4  8.9 - 10.3 mg/dL Final  . Total Protein 11/27/2019 8.0  6.5 - 8.1 g/dL Final  . Albumin 11/27/2019 4.1  3.5 - 5.0 g/dL Final  . AST 11/27/2019 15  15 - 41 U/L Final  . ALT 11/27/2019 11  0 - 44 U/L Final  . Alkaline Phosphatase 11/27/2019 74  38 - 126 U/L Final  . Total Bilirubin 11/27/2019 0.6  0.3 - 1.2 mg/dL Final  . GFR calc non Af Amer 11/27/2019 >60  >60 mL/min Final  . GFR calc Af Amer 11/27/2019 >60  >60 mL/min Final  . Anion gap 11/27/2019 12  5 - 15 Final   Performed at [facility], [place] [address], [place], [place] [address]  . Alcohol, Ethyl (B) 11/27/2019 <10  <10 mg/dL Final   Comment: (NOTE) Lowest detectable limit for serum alcohol is 10 mg/dL.  For medical purposes only. Performed at [facility], [place] [address], [place], [place] [address]   . Cholesterol 11/27/2019 193  0 - 200 mg/dL Final  . Triglycerides 11/27/2019 75  <150 mg/dL Final  .  HDL 11/27/2019 54  >40 mg/dL Final  . Total CHOL/HDL Ratio 11/27/2019 3.6  RATIO Final  . VLDL 11/27/2019 15  0 - 40 mg/dL Final  . LDL Cholesterol 11/27/2019 124* 0 - 99 mg/dL Final   Comment:        Total Cholesterol/HDL:CHD Risk Coronary Heart Disease Risk Table                     Men   Women  1/2 Average Risk   3.4   3.3  Average Risk       5.0   4.4  2 X Average Risk   9.6   7.1  3 X Average Risk  23.4   11.0        Use the calculated Patient Ratio above and the CHD Risk Table to determine the patient's CHD Risk.        ATP III CLASSIFICATION (LDL):  <100     mg/dL   Optimal  100-129   mg/dL   Near or Above                    Optimal  130-159  mg/dL   Borderline  160-189  mg/dL   High  >190     mg/dL   Very High Performed at [facility] [address], [place], [place] [address]   . TSH 11/27/2019 3.181  0.350 - 4.500 uIU/mL Final   Comment: Performed by a 3rd Generation assay with a functional sensitivity of <=0.01 uIU/mL. Performed at [facility], [place] [address], [place], [place] [address]   . Color, Urine 11/27/2019 YELLOW  YELLOW Final  . APPearance 11/27/2019 CLOUDY* CLEAR Final  . Specific Gravity, Urine 11/27/2019 1.015  1.005 - 1.030 Final  . pH 11/27/2019 5.0  5.0 - 8.0 Final  . Glucose, UA 11/27/2019 NEGATIVE  NEGATIVE mg/dL Final  . Hgb urine dipstick 11/27/2019 MODERATE* NEGATIVE Final  . Bilirubin Urine 11/27/2019 NEGATIVE  NEGATIVE Final  . Ketones, ur 11/27/2019 5* NEGATIVE mg/dL Final  . Protein, ur 11/27/2019 NEGATIVE  NEGATIVE mg/dL Final  . Nitrite 11/27/2019 NEGATIVE  NEGATIVE Final  . Leukocytes,Ua 11/27/2019 LARGE* NEGATIVE Final  . RBC / HPF 11/27/2019 >50* 0 - 5 RBC/hpf Final  . WBC, UA 11/27/2019 21-50  0 - 5 WBC/hpf Final  . Bacteria, UA 11/27/2019 FEW* NONE SEEN Final  . Squamous Epithelial / LPF 11/27/2019 6-10  0 - 5 Final  . Mucus 11/27/2019 PRESENT   Final   Performed at [facility],  [address] [place], [place] [address]  . Opiates 11/27/2019 NONE DETECTED  NONE DETECTED Final  . Cocaine 11/27/2019 NONE DETECTED  NONE DETECTED Final  . Benzodiazepines 11/27/2019 NONE DETECTED  NONE DETECTED Final  . Amphetamines 11/27/2019 NONE DETECTED  NONE DETECTED Final  . Tetrahydrocannabinol 11/27/2019 POSITIVE* NONE DETECTED Final  . Barbiturates 11/27/2019 NONE DETECTED  NONE DETECTED Final   Comment: (NOTE) DRUG SCREEN FOR MEDICAL PURPOSES ONLY.  IF CONFIRMATION IS NEEDED FOR ANY PURPOSE, NOTIFY LAB WITHIN 5 DAYS.  LOWEST DETECTABLE LIMITS FOR URINE DRUG SCREEN Drug Class                     Cutoff  (ng/mL) Amphetamine and metabolites    1000 Barbiturate and metabolites    200 Benzodiazepine                 275 Tricyclics and metabolites  300 Opiates and metabolites        300 Cocaine and metabolites        300 THC                            50 Performed at [facility], [place] [address] [place], [place] [address]   . SARS Coronavirus 2 Ag 11/27/2019 NEGATIVE  NEGATIVE Final   Comment: (NOTE) SARS-CoV-2 antigen NOT DETECTED.   Negative results are presumptive.  Negative results do not preclude SARS-CoV-2 infection and should not be used as the sole basis for treatment or other patient management decisions, including infection  control decisions, particularly in the presence of clinical signs and  symptoms consistent with COVID-19, or in those who have been in contact with the virus.  Negative results must be combined with clinical observations, patient history, and epidemiological information. The expected result is Negative.  Fact Sheet for Patients: PodPark.tn  Fact Sheet for Healthcare Providers: GiftContent.is   This test is not yet approved or cleared by the [place] FDA and  has been authorized for detection and/or diagnosis of SARS-CoV-2 by FDA under an Emergency Use Authorization (EUA).  This EUA will remain in effect (meaning this test can be used) for the duration of  the C                          OVID-19 declaration under Section 564(b)(1) of the Act, 21 U.S.C. section 360bbb-3(b)(1), unless the authorization is terminated or revoked sooner.      Allergies: Latuda [lurasidone hcl], Abilify [aripiprazole], Flagyl [metronidazole hcl], Prolixin [fluphenazine], Risperidone and related, Seroquel [quetiapine fumarate], and Sulfa antibiotics  PTA Medications: (Not in a hospital admission)  No current facility-administered medications on file prior to encounter.   Current Outpatient  Medications on File Prior to Encounter  Medication Sig Dispense Refill  . ABILIFY 30 MG tablet Take 30 mg by mouth once.    Emogene Morgan 882 MG/3.2ML prefilled syringe Inject 882 mg into the muscle every 30 (thirty) days.    . benzonatate (TESSALON) 100 MG capsule Take 1 capsule (100 mg total) by mouth every 8 (eight) hours. 21 capsule 0  . carbamazepine (TEGRETOL XR) 200 MG 12 hr tablet Take 1 tablet (200 mg total) by mouth daily. For mood stabilization 30 tablet 0  . diphenhydrAMINE (BENADRYL) 50 MG capsule Take 50 mg by mouth 3 (three) times daily.    . hydrOXYzine (ATARAX/VISTARIL) 25 MG tablet Take 1 tablet (25 mg total) by mouth 3 (three) times daily as needed for anxiety. (Patient taking differently: Take 25 mg by mouth 3 (three) times daily. ) 75 tablet 0  . nicotine polacrilex (NICORETTE) 2 MG gum Take 1 each (2 mg total) by mouth as needed. (May buy from over the counter): For smoking cessation (Patient not taking: Reported on 12/10/2019) 100 tablet 0  . SEROQUEL 100 MG tablet Take 100 mg by mouth at bedtime.    . traZODone (DESYREL) 50 MG tablet Take 1 tablet (50 mg total) by mouth at bedtime as needed for sleep. (Patient taking differently: Take 50 mg by mouth at bedtime. ) 30 tablet 0  . trihexyphenidyl (ARTANE) 2 MG tablet Take 1 tablet (2 mg total) by mouth 2 (two) times daily with a meal. For prevention drug induced tremors. 60 tablet 0    Medical Decision Making  Admission labs ordered  UDS positive  for marijuana, otherwise negative  Continue seroquel 100 mg QHS for mood stability Continue trazodone 50 mg QHS for sleep    Recommendations  Based on my evaluation the patient does not appear to have an emergency medical condition.   Patient will be placed in the continuous assessment area at [facility] for treatment and stabilization. She will be reevaluated on 03/20/2020. The treatment team will determine disposition at that time.      Rozetta Nunnery, NP 03/19/20  10:57 PM

## 2020-03-19 NOTE — ED Notes (Signed)
Unable to access vein for lab draws, patient reports being dehydrated. Will attempt in am and encourage patient to drink fluids through night while awake.

## 2020-03-20 LAB — RESP PANEL BY RT-PCR (FLU A&B, COVID) ARPGX2
Influenza A by PCR: NEGATIVE
Influenza B by PCR: NEGATIVE
SARS Coronavirus 2 by RT PCR: NEGATIVE

## 2020-03-20 NOTE — ED Notes (Signed)
Pt resting on pull out with eyes closed unlabored respirations through night in no acute distress. Safety maintained.

## 2020-03-20 NOTE — Discharge Instructions (Signed)

## 2020-03-20 NOTE — ED Notes (Signed)
Patient A&O x 4, ambulatory. Patient discharged in no acute distress. Patient denied SI/HI, A/VH upon discharge. Patient verbalized understanding of all discharge instructions explained by staff, to include follow up appointments, medication changes and safety plan. Patient escorted to lobby via staff for transport to destination. Safety maintained.

## 2020-03-20 NOTE — ED Provider Notes (Signed)
FBC/OBS ASAP Discharge Summary  Date and Time: 03/20/2020 8:56 AM  Name: [name]  MRN:  [id number]   Discharge Diagnoses:  Final diagnoses:  Schizoaffective disorder, unspecified type ([place])  Suicidal ideation    Subjective: Patient reports this morning that she is doing better.  She denies any suicidal or homicidal ideations and denies any hallucinations.  Patient is asked why she came to the hospital and she states that she does not like her neighborhood.  She reports that her ACT team is assisting her with trying to get moved to but it just has not happened yet.  She states she does not like the people in her neighborhood and sometimes she does not trust them.  She states that she may get to the point that she may just go to a shelter instead of staying in her own home.  Patient then states that her brother, [name], may come and stay with her but then she states that she does not want him contacted because he is already told her no in the past.  She reports that she would just return home and will continue following up with her ACT team. Dr. [name], [facility] provider, was contacted.  He states that he saw the patient yesterday and she is at baseline he has no concerns with her discharging home.  He states that he will notify his team with having visits with the patient to assist with keeping her at home more.  Stay Summary: Patient is a 49 year old female with a history of schizoaffective disorder who presented to the [facility] voluntarily with law enforcement due to contacting law enforcement and reporting suicidal ideations.  Patient denied having a plan but then was reported having symptoms of paranoia and hallucinations.  Patient was admitted to the continuous observation unit for overnight assessment.  Today patient was denying any suicidal or homicidal ideations and denied any hallucinations.  Patient was stating that she needs to live from her neighborhood because she does  not like it but also informed me that her ACT team is assisting her with getting moved.  [facility] team and provider, Dr. [name], was contacted.  He states that he had seen the patient yesterday and had a no concerns with the patient discharging home today.  Patient be discharged home via safe transport and is recommended to continue follow-up with her ACT team and continue her current home medications.  Total Time spent with patient: 20 minutes  Past Psychiatric History: Bipolar 1 disorder, MDD, schizoaffective disorder, schizophrenia, current with [facility], multiple hospitalizations, numerous ED visits Past Medical History:  Past Medical History:  Diagnosis Date  . Arthritis   . Bipolar 1 disorder ([place])   . Bronchitis   . Depression   . Hemorrhoid   . Schizoaffective disorder ([place])   . Schizophrenia ([place])    Pt denies and reports it is schizoaffective disorder.   . Transfusion history     Past Surgical History:  Procedure Laterality Date  . CESAREAN SECTION     Family History:  Family History  Problem Relation Age of Onset  . Diabetes type II Other    Family Psychiatric History: None reported Social History:  Social History   Substance and Sexual Activity  Alcohol Use No     Social History   Substance and Sexual Activity  Drug Use No  . Types: Marijuana   Comment: Former user    Social History   Socioeconomic History  . Marital status:  Single    Spouse name: Not on file  . Number of children: Not on file  . Years of education: Not on file  . Highest education level: Not on file  Occupational History  . Occupation: unemployed  Tobacco Use  . Smoking status: Current Every Day Smoker    Packs/day: 0.50    Years: 17.00    Pack years: 8.50    Types: Cigarettes  . Smokeless tobacco: Never Used  Vaping Use  . Vaping Use: Never used  Substance and Sexual Activity  . Alcohol use: No  . Drug use: No    Types: Marijuana    Comment: Former user  . Sexual  activity: Not Currently  Other Topics Concern  . Not on file  Social History Narrative   ** Merged History Encounter **       Social Determinants of Health   Financial Resource Strain:   . Difficulty of Paying Living Expenses: Not on file  Food Insecurity:   . Worried About Charity fundraiser in the Last Year: Not on file  . Ran Out of Food in the Last Year: Not on file  Transportation Needs:   . Lack of Transportation (Medical): Not on file  . Lack of Transportation (Non-Medical): Not on file  Physical Activity:   . Days of Exercise per Week: Not on file  . Minutes of Exercise per Session: Not on file  Stress:   . Feeling of Stress : Not on file  Social Connections:   . Frequency of Communication with Friends and Family: Not on file  . Frequency of Social Gatherings with Friends and Family: Not on file  . Attends Religious Services: Not on file  . Active Member of Clubs or Organizations: Not on file  . Attends Archivist Meetings: Not on file  . Marital Status: Not on file   SDOH:  SDOH Screenings   Alcohol Screen: Low Risk   . Last Alcohol Screening Score (AUDIT): 0  Depression (PHQ2-9):   . PHQ-2 Score: Not on file  Financial Resource Strain:   . Difficulty of Paying Living Expenses: Not on file  Food Insecurity:   . Worried About Charity fundraiser in the Last Year: Not on file  . Ran Out of Food in the Last Year: Not on file  Housing:   . Last Housing Risk Score: Not on file  Physical Activity:   . Days of Exercise per Week: Not on file  . Minutes of Exercise per Session: Not on file  Social Connections:   . Frequency of Communication with Friends and Family: Not on file  . Frequency of Social Gatherings with Friends and Family: Not on file  . Attends Religious Services: Not on file  . Active Member of Clubs or Organizations: Not on file  . Attends Archivist Meetings: Not on file  . Marital Status: Not on file  Stress:   . Feeling of  Stress : Not on file  Tobacco Use: High Risk  . Smoking Tobacco Use: Current Every Day Smoker  . Smokeless Tobacco Use: Never Used  Transportation Needs:   . Film/video editor (Medical): Not on file  . Lack of Transportation (Non-Medical): Not on file    Has this patient used any form of tobacco in the last 30 days? (Cigarettes, Smokeless Tobacco, Cigars, and/or Pipes) A prescription for an FDA-approved tobacco cessation medication was offered at discharge and the patient refused  Current Medications:  Current  Facility-Administered Medications  Medication Dose Route Frequency Provider Last Rate Last Admin  . alum & mag hydroxide-simeth (MAALOX/MYLANTA) 200-200-20 MG/5ML suspension 30 mL  30 mL Oral Q4H PRN [name], NP      . magnesium hydroxide (MILK OF MAGNESIA) suspension 30 mL  30 mL Oral Daily PRN [name], NP      . QUEtiapine (SEROQUEL) tablet 100 mg  100 mg Oral QHS [name], NP   100 mg at 03/19/20 2329  . traZODone (DESYREL) tablet 50 mg  50 mg Oral QHS [name], NP   50 mg at 03/19/20 2329   Current Outpatient Medications  Medication Sig Dispense Refill  . ARISTADA 882 MG/3.2ML prefilled syringe Inject 882 mg into the muscle every 30 (thirty) days.    . carbamazepine (TEGRETOL XR) 200 MG 12 hr tablet Take 1 tablet (200 mg total) by mouth daily. For mood stabilization 30 tablet 0  . diphenhydrAMINE (BENADRYL) 50 MG capsule Take 50 mg by mouth 3 (three) times daily.    . hydrOXYzine (ATARAX/VISTARIL) 25 MG tablet Take 1 tablet (25 mg total) by mouth 3 (three) times daily as needed for anxiety. (Patient taking differently: Take 25 mg by mouth 3 (three) times daily. ) 75 tablet 0  . nicotine polacrilex (NICORETTE) 2 MG gum Take 1 each (2 mg total) by mouth as needed. (May buy from over the counter): For smoking cessation (Patient not taking: Reported on 12/10/2019) 100 tablet 0  . SEROQUEL 100 MG tablet Take 100 mg by mouth at bedtime.    . traZODone  (DESYREL) 50 MG tablet Take 1 tablet (50 mg total) by mouth at bedtime as needed for sleep. (Patient taking differently: Take 50 mg by mouth at bedtime. ) 30 tablet 0  . trihexyphenidyl (ARTANE) 2 MG tablet Take 1 tablet (2 mg total) by mouth 2 (two) times daily with a meal. For prevention drug induced tremors. 60 tablet 0    PTA Medications: (Not in a hospital admission)   Musculoskeletal  Strength & Muscle Tone: within normal limits Gait & Station: normal Patient leans: N/A  Psychiatric Specialty Exam  Presentation  General Appearance: Appropriate for Environment;Casual  Eye Contact:Good  Speech:Clear and Coherent;Normal Rate  Speech Volume:Normal  Handedness:Right   Mood and Affect  Mood:Depressed  Affect:Appropriate;Congruent   Thought Process  Thought Processes:Coherent  Descriptions of Associations:Intact  Orientation:Full (Time, Place and Person)  Thought Content:Paranoid Ideation;Other (comment) (Paranoia is due to her not liking her neighborhood and wanting to move)  Hallucinations:Hallucinations: None Description of Auditory Hallucinations: voices tell her that they are going to break her windows, shoot her  Ideas of Reference:None  Suicidal Thoughts:Suicidal Thoughts: No SI Passive Intent and/or Plan: Without Intent;Without Plan  Homicidal Thoughts:Homicidal Thoughts: No HI Passive Intent and/or Plan: Without Intent;Without Plan   Sensorium  Memory:Immediate Good;Recent Good;Remote Good  Judgment:Fair  Insight:Good   Executive Functions  Concentration:Good  Attention Span:Good  Recall:Good  Fund of Knowledge:Good  Language:Good   Psychomotor Activity  Psychomotor Activity:Psychomotor Activity: Normal   Assets  Assets:Communication Skills;Desire for Improvement;Financial Resources/Insurance;Housing;Social Support   Sleep  Sleep:Sleep: Good   Physical Exam  Physical Exam Vitals and nursing note reviewed.  Constitutional:       Appearance: She is well-developed.  HENT:     Head: Normocephalic.  Eyes:     Pupils: Pupils are equal, round, and reactive to light.  Cardiovascular:     Rate and Rhythm: Normal rate.  Pulmonary:  Effort: Pulmonary effort is normal.  Musculoskeletal:        General: Normal range of motion.  Neurological:     Mental Status: She is alert and oriented to person, place, and time.    Review of Systems  Constitutional: Negative.   HENT: Negative.   Eyes: Negative.   Respiratory: Negative.   Cardiovascular: Negative.   Gastrointestinal: Negative.   Genitourinary: Negative.   Musculoskeletal: Negative.   Skin: Negative.   Neurological: Negative.   Endo/Heme/Allergies: Negative.   Psychiatric/Behavioral: Negative.    Blood pressure (!) 152/99, pulse 86, temperature 97.7 F (36.5 C), temperature source Tympanic, resp. rate 20, SpO2 99 %. There is no height or weight on file to calculate BMI.  Demographic Factors:  Living alone  Loss Factors: NA  Historical Factors: NA  Risk Reduction Factors:   Religious beliefs about death, Positive social support, Positive therapeutic relationship and Positive coping skills or problem solving skills  Continued Clinical Symptoms:  Previous Psychiatric Diagnoses and Treatments  Cognitive Features That Contribute To Risk:  None    Suicide Risk:  Mild:  Suicidal ideation of limited frequency, intensity, duration, and specificity.  There are no identifiable plans, no associated intent, mild dysphoria and related symptoms, good self-control (both objective and subjective assessment), few other risk factors, and identifiable protective factors, including available and accessible social support.  Plan Of Care/Follow-up recommendations:  Continue activity as tolerated. Continue diet as recommended by your PCP. Ensure to keep all appointments with outpatient providers.  Disposition: Discharge home and follow-up with ACT team  Lewis Shock, FNP 03/20/2020, 8:56 AM

## 2020-03-20 NOTE — ED Notes (Signed)
Pt sleeping in no acute distress. RR even and unlabored. Safety maintained. 

## 2020-03-20 NOTE — ED Notes (Signed)
Safe transport called to take pt to home

## 2020-07-02 ENCOUNTER — Ambulatory Visit (HOSPITAL_COMMUNITY)
Admission: EM | Admit: 2020-07-02 | Discharge: 2020-07-02 | Disposition: A | Discharge status: Home / Self Care | Payer: Medicaid Other | Attending: Student | Admitting: Student

## 2020-07-02 DIAGNOSIS — F32A Depression, unspecified: Secondary | ICD-10-CM | POA: Insufficient documentation

## 2020-07-02 DIAGNOSIS — R45851 Suicidal ideations: Secondary | ICD-10-CM | POA: Diagnosis not present

## 2020-07-02 DIAGNOSIS — Z79899 Other long term (current) drug therapy: Secondary | ICD-10-CM | POA: Insufficient documentation

## 2020-07-02 DIAGNOSIS — F17210 Nicotine dependence, cigarettes, uncomplicated: Secondary | ICD-10-CM | POA: Insufficient documentation

## 2020-07-02 DIAGNOSIS — F259 Schizoaffective disorder, unspecified: Secondary | ICD-10-CM | POA: Diagnosis not present

## 2020-07-02 DIAGNOSIS — Z634 Disappearance and death of family member: Secondary | ICD-10-CM | POA: Insufficient documentation

## 2020-07-02 DIAGNOSIS — U071 COVID-19: Secondary | ICD-10-CM | POA: Diagnosis not present

## 2020-07-02 LAB — RESP PANEL BY RT-PCR (FLU A&B, COVID) ARPGX2
Influenza A by PCR: NEGATIVE
Influenza B by PCR: NEGATIVE
SARS Coronavirus 2 by RT PCR: POSITIVE — AB

## 2020-07-02 LAB — POC SARS CORONAVIRUS 2 AG: SARS Coronavirus 2 Ag: NEGATIVE

## 2020-07-02 MED ORDER — ALUM & MAG HYDROXIDE-SIMETH 200-200-20 MG/5ML PO SUSP: 30.0000 mL | ORAL | Status: DC | PRN

## 2020-07-02 MED ORDER — TRAZODONE HCL 50 MG PO TABS: 50.0000 mg | ORAL_TABLET | Freq: Every evening | ORAL | Status: DC | PRN

## 2020-07-02 MED ORDER — HYDROXYZINE HCL 25 MG PO TABS: 25.0000 mg | ORAL_TABLET | Freq: Three times a day (TID) | ORAL | Status: DC | PRN

## 2020-07-02 MED ORDER — ACETAMINOPHEN 325 MG PO TABS: 650.0000 mg | ORAL_TABLET | Freq: Four times a day (QID) | ORAL | Status: DC | PRN

## 2020-07-02 MED ORDER — MAGNESIUM HYDROXIDE 400 MG/5ML PO SUSP: 30.0000 mL | Freq: Every day | ORAL | Status: DC | PRN

## 2020-07-02 NOTE — BH Assessment (Signed)
Comprehensive Clinical Assessment (CCA) Note  07/02/2020 [name] [id number] Disposition: Gave clinical report to Ulysees Barns, [place] who recommended pt be observed overnight. Per Paisley T, PA pt. should remain in continuous observation at [facility] and reassessment in the AM.   Patricia D. Reidis a 50year-old patient who [facility]. Pt presented via EMS ,due calling 911 and endorsing SI and HI without a plan. Pt presented with a blunted affect and a depressed mood. Pt was noted to be experiencing thought blocking throughout the assessment. Pt was initially ready to go home upon arrival, however she agreed to overnight observation. Pt was calm and cooperative and oriented x4. Pt was drowsy with slurred and slowspeech. When asked what brought her to [facility] the pt. stated, "I got so depressed. I'm just feeling not right".Pt initially admitted to hearing auditory hallucinations of people laughing, but retracted the statement when probed a second time. Pt rated her depression at a 10 on a scale of 1-10. Pt identified her stressors as the death of her cousin 2 days ago. Pt's thought processes were coherent butpt has impaired insight and recall/memory. Pt reported that she'd received her long acting injectible on 07/01/20. Pt reported having a hx of schizoaffective disorder and that she is followed by Dr. Serita Grammes Team.Pt currently denies SI, HI, and AV/H. Pt denied feelings of passive SI and HI despite reporting this during her 911 call. Pt denied using any substances. Pt denies previoussuicide attempts or NSSIB.Pt denies any hx of abuse.  [place] ED from 03/19/2020 in [facility] Admission (Discharged) from 11/28/2019 in [place] [facility] ED from 11/27/2019 in [place] No Risk Low Risk Low Risk    The patient demonstrates the following risk factors for suicide: Chronic risk  factors for suicide include: psychiatric disorder of schizoaffective disorder.. Acute risk factors for suicide include: death of a family member. Protective factors for this patient include: positive therapeutic relationship. Considering these factors, the overall suicide risk at this point appears to be low. Patient is appropriate for outpatient follow up.  Chief Complaint:  Chief Complaint  Patient presents with  . Depression   Visit Diagnosis: Schizoaffective Disorder    CCA Screening, Triage and Referral (STR)  Patient Reported Information How did you hear about [place]? -- (EMS)  Referral name: EMS-pt minimized endorsing SI and HI  Referral phone number: 0 (n/a)   Whom do you see for routine medical problems? Hospital ER  Practice/Facility Name: [facility]  Practice/Facility Phone Number: [phone] (n/a)  Name of Contact: No data recorded Contact Number: No data recorded Contact Fax Number: No data recorded Prescriber Name: Varies  Prescriber Address (if known): n/a   What Is the Reason for Your Visit/Call Today? Depression  How Long Has This Been Causing You Problems? <Week (Pt called EMS endorsing SI, HI without a plan)  What Do You Feel Would Help You the Most Today? -- (Overnight observation)   Have You Recently Been in Any Inpatient Treatment (Hospital/Detox/Crisis Center/28-Day Program)? No  Name/Location of Program/Hospital:[facility]  How Long Were You There? 3 days  When Were You Discharged? 11/30/2019   Have You Ever Received Services From [employer] Before? Yes  Who Do You See at [facility]? Pt has seen Dr. Mallie Darting when inpt at [place] Recently Had Any Thoughts About [place]? Yes  Are You Planning to Commit Suicide/Harm Yourself At This time?  No   Have you Recently Had Thoughts About [place]? No  Explanation: No data recorded  Have You Used Any Alcohol or Drugs in the Past 24 Hours? No  How Long Ago Did You Use Drugs or Alcohol?  No data recorded What Did You Use and How Much? No data recorded  Do You Currently Have a Therapist/Psychiatrist? Yes  Name of Therapist/Psychiatrist: Dr. [name]; sees through ACT team   Have You Been Recently Discharged From Any Office Practice or Programs? No  Explanation of Discharge From Practice/Program: No data recorded    CCA Screening Triage Referral Assessment Type of Contact: Face-to-Face  Is this Initial or Reassessment? No data recorded Date Telepsych consult ordered in [facility]:  No data recorded Time Telepsych consult ordered in [facility]:  No data recorded  Patient Reported Information Reviewed? Yes  Patient Left Without Being Seen? No data recorded Reason for Not Completing Assessment: No data recorded  Collateral Involvement: Pt consents to contact mother, if necessary   Does Patient Have a Court Appointed Legal Guardian? No data recorded Name and Contact of Legal Guardian: No data recorded If Minor and Not Living with Parent(s), Who has Custody? n/a  Is CPS involved or ever been involved? Never  Is APS involved or ever been involved? Never   Patient Determined To Be At Risk for Harm To Self or Others Based on Review of Patient Reported Information or Presenting Complaint? No  Method: No data recorded Availability of Means: No data recorded Intent: No data recorded Notification Required: No data recorded Additional Information for Danger to Others Potential: No data recorded Additional Comments for Danger to Others Potential: No data recorded Are There Guns or Other Weapons in Your Home? No data recorded Types of Guns/Weapons: No data recorded Are These Weapons Safely Secured?                            No data recorded Who Could Verify You Are Able To Have These Secured: No data recorded Do You Have any Outstanding Charges, Pending Court Dates, Parole/Probation? No data recorded Contacted To Inform of Risk of Harm To Self or Others: -- (n/a)   Location of  Assessment: [facility] Assessment Services   Does Patient Present under Involuntary Commitment? No  IVC Papers Initial File Date: No data recorded  [place] of Residence: [place]   Patient Currently Receiving the Following Services: ACTT Architect); Medication Management   Determination of Need: Urgent (48 hours)   Options For Referral: -- (Overnight observation and reassessment in the AM)     CCA Biopsychosocial Intake/Chief Complaint:  Depression, SI, HI (passive)  Current Symptoms/Problems: Pt rates her depression at a 10 on a scale of 1-10; feelings of hopelessness   Patient Reported Schizophrenia/Schizoaffective Diagnosis in Past: Yes   Strengths: Pt is able to identify that she needs assistance with her mental health.  Preferences: Pt is requesting to stay in the [facility]hospital for overnight observation.  Abilities: Pt lives independently. She states she takes her medication as prescribed.   Type of Services Patient Feels are Needed: Pt was initially ready to return home; but agreed to being observed overnight.   Initial Clinical Notes/Concerns: N/A   Mental Health Symptoms Depression:  Change in energy/activity; Fatigue; Hopelessness; Worthlessness; Increase/decrease in appetite; Tearfulness   Duration of Depressive symptoms: Less than two weeks   Mania:  None   Anxiety:   Fatigue; Tension; Worrying   Psychosis:  -- (  Pt reported hearing people laughing; later retracted this statement)   Duration of Psychotic symptoms: Less than six months   Trauma:  None   Obsessions:  None   Compulsions:  None   Inattention:  None   Hyperactivity/Impulsivity:  N/A   Oppositional/Defiant Behaviors:  None   Emotional Irregularity:  None   Other Mood/Personality Symptoms:  None noted    Mental Status Exam Appearance and self-care  Stature:  Average   Weight:  Average weight   Clothing:  Casual   Grooming:  Normal   Cosmetic use:   None   Posture/gait:  Normal   Motor activity:  Slowed   Sensorium  Attention:  Normal   Concentration:  Scattered   Orientation:  Object; Place; Situation   Recall/memory:  Normal; Defective in Short-term; Defective in Immediate; Defective in Recent   Affect and Mood  Affect:  Blunted; Flat   Mood:  Depressed   Relating  Eye contact:  Fleeting   Facial expression:  Constricted; Depressed   Attitude toward examiner:  Cooperative   Thought and Language  Speech flow: Blocked; Slow   Thought content:  Appropriate to Mood and Circumstances   Preoccupation:  None   Hallucinations:  Auditory   Organization:  No data recorded  [employer] of Knowledge:  -- Special educational needs teacher)   Intelligence:  -- [name]   Abstraction:  -- (UTA)   Judgement:  Impaired   Reality Testing:  Distorted   Insight:  Gaps; Flashes of insight   Decision Making:  Confused   Social Functioning  Social Maturity:  Impulsive   Social Judgement:  Heedless   Stress  Stressors:  Grief/losses; Housing   Coping Ability:  Overwhelmed   Skill Deficits:  Self-control   Supports:  Family; Friends/Service system     Religion: Religion/Spirituality Are You A Religious Person?: Yes What is Your Religious Affiliation?: Holiness How Might This Affect Treatment?: N/A  Leisure/Recreation: Leisure / Recreation Do You Have Hobbies?: No  Exercise/Diet: Exercise/Diet Do You Exercise?:  (n/a) Have You Gained or Lost A Significant Amount of Weight in the Past Six Months?: Yes-Gained Number of Pounds Gained: 40 Do You Follow a Special Diet?: No Do You Have Any Trouble Sleeping?:  (Pt reported sleep disturbance with this Probation officer; denied during PA assessment)   CCA Employment/Education Employment/Work Situation: Employment / Work Situation Employment situation: On disability Why is patient on disability: Mental health How long has patient been on disability: Since the age of 25 Patient's  job has been impacted by current illness:  (n/a) What is the longest time patient has a held a job?: N/A Where was the patient employed at that time?: N/A Has patient ever been in the [employer]?: No  Education: Education Is Patient Currently Attending School?: No Last Grade Completed:  (n/a) Name of High School: N/A Did Teacher, adult education From [employer]?:  (n/a) Did You Attend College?:  (n/a) Did You Have Any Special Interests In School?: N/A Did You Have An Individualized Education Program (IIEP):  (n/a) Did You Have Any Difficulty At School?:  (n/a) Patient's Education Has Been Impacted by Current Illness:  (n/a)   CCA Family/Childhood History Family and Relationship History: Family history Marital status: Single Are you sexually active?: Yes What is your sexual orientation?: N/A Has your sexual activity been affected by drugs, alcohol, medication, or emotional stress?: N/A Does patient have children?:  (N/A)  Childhood History:  Childhood History By whom was/is the patient raised?:  (n/a) Additional childhood  history information: N/A Description of patient's relationship with caregiver when they were a child: N/A How were you disciplined when you got in trouble as a child/adolescent?: N/A Did patient suffer any verbal/emotional/physical/sexual abuse as a child?: No Did patient suffer from severe childhood neglect?: No Has patient ever been sexually abused/assaulted/raped as an adolescent or adult?: No Witnessed domestic violence?: No Has patient been affected by domestic violence as an adult?: No  Child/Adolescent Assessment:     CCA Substance Use Alcohol/Drug Use: Alcohol / Drug Use Pain Medications: Please see MAR Prescriptions: Please see MAR Over the Counter: Please see MAR History of alcohol / drug use?: No history of alcohol / drug abuse Longest period of sobriety (when/how long): Pt denies SA                         ASAM's:  Six Dimensions of  Multidimensional Assessment  Dimension 1:  Acute Intoxication and/or Withdrawal Potential:      Dimension 2:  Biomedical Conditions and Complications:      Dimension 3:  Emotional, Behavioral, or Cognitive Conditions and Complications:     Dimension 4:  Readiness to Change:     Dimension 5:  Relapse, Continued use, or Continued Problem Potential:     Dimension 6:  Recovery/Living Environment:     ASAM Severity Score:    ASAM Recommended Level of Treatment:     Substance use Disorder (SUD)    Recommendations for Services/Supports/Treatments:    DSM5 Diagnoses: Patient Active Problem List   Diagnosis Date Noted  . Schizophrenia ([place]) 02/07/2019  . Schizoaffective disorder ([place]) 11/08/2018  . No diagnosis on Axis I 08/21/2015  . Abnormal behavior   . Involuntary commitment   . Psychosis ([place])   . Disturbance in affect   . Tobacco use disorder 05/12/2015  . Cannabis use disorder, moderate, dependence ([place]) 05/12/2015  . Schizo affective schizophrenia ([place]) 05/10/2015  . Hypertension 02/24/2011  . ANEMIA-IRON DEFICIENCY 12/13/2006  . GERD 12/13/2006  . OBESITY 12/12/2006  . Schizoaffective disorder, bipolar type ([place]) 12/12/2006  . HEMORRHOIDS, NOS 12/12/2006  . PAIN-NECK 12/12/2006  . Abnormal involuntary movement 12/12/2006    Brownie Nehme R Paula Zietz, LCAS

## 2020-07-02 NOTE — ED Provider Notes (Signed)
FBC/OBS ASAP Discharge Summary  Date and Time: 07/02/2020 7:26 AM  Name: [name]  MRN:  [id number]   Discharge Diagnoses:  Final diagnoses:  Schizoaffective disorder, unspecified type ([place])  Suicidal ideation    Subjective: See HPI from my earlier 07/02/20 [facility] Admission H&P Note Below:   "[name] is a 50 year old female with a history of schizoaffective disorder who presents to [facility] voluntarily via EMS for SI and HI.  When patient is asked why she was brought to [facility], patient states that "I'm just not feeling right.  I have been crying and really depressed.  I just got some meds on board.  Things are not going right right now".  Patient reports feeling severely increased depression recently due to her cousin passing away 2 days ago.  Patient becomes tearful on exam when she mentions the passing of her cousin.  Patient states that she is "really sad" and she states that "my sadness is a 10 out of 10 right now".  Patient denies active or passive SI on exam.  Patient denies HI on exam as well.  Patient also denies reporting SI or HI to EMS prior to [facility] arrival.  However, per EMS, patient reported to EMS that she was having suicidal and homicidal thoughts currently.  EMS states that when they asked the patient to describe her suicidal homicidal thoughts in further detail, patient declined to provide further detail.  EMS also reports that prior to transporting the patient to [facility], they asked the patient if she was currently having thoughts of wanting to harm herself or others and the patient replied "Yes". EMS reports that when they asked the patient to provide further details regarding these thoughts, patient declined.  Patient denies any history of past suicide attempts.  She denies any history of intentionally cutting or burning herself.  Patient denies AVH, paranoia, or delusions on exam.  Patient reports that she has an ACT Team and that she sees Dr. [name] for medication management.   She reports that she saw Dr. [name] last week and that she had an appointment on July 01, 2020, in which she had "an injection".  Patient is unable to recall the name of the medication/type of injection that she had on July 01, 2020, but she states that she thinks that the name of the medication may "start with an M".  Patient reports that this is only the second time she has had this injection and that she was supposed to be receiving the injection every other month.  Patient states that she is not currently seeing a therapist and that she is supposed to be set up with a new therapist soon through her ACT team, but she does not know when this will occur.  Patient states that in addition to her every other month injection, the only other medication she is taking are Seroquel 400 mg p.o. at bedtime and Seroquel 200 mg p.o. during the day.  However, patient's chart shows history of other medications such as trazodone 50 mg p.o. PRN at bedtime for sleep and MDD, Tegretol 200 mg p.o. daily for mood stabilization, Vistaril 25 mg p.o. 3 times daily PRN for anxiety, Artane 2 mg p.o. twice daily with meals for EPS caused by medications, Aristada 982 mg injection every 30 days, and Seroquel 100 mg p.o. daily at bedtime.  Unable to find any ACT team records in the patient's chart. Chart review shows that patient was admitted to [facility] continuous observation/assessment on March 11, 2020 and March 19, 2020.  Chart review also shows that patient was admitted to [facility] for hallucinations and schizoaffective disorder on November 28, 2019 and was discharged on November 30, 2019 with the carbamazepine, Vistaril, trazodone, and Artane prescriptions mentioned previously.  Patient reports she has been sleeping well, about 8 hours per night.  Patient endorses feelings of anhedonia, as well as feelings of guilt, hopelessness, and worthlessness.  Patient denies any energy or concentration changes.  Patient reports increase in appetite and  she reports a 40 pound weight gain over an unknown timeframe.  Patient denies any alcohol or drug use.  Per chart review, patient's last UDS from March 19, 2020 was positive for marijuana.  Patient does endorse smoking 1.5 packs/day of cigarettes since the age of 48.  Patient states that she lives by herself in [place].  She denies any access to guns or weapons.  Patient states that she is unemployed and that she has no sources of social support.  She denies being a past victim of verbal, physical, or sexual abuse.  On exam, patient is sitting in a chair, appearing tired and tearful at times, but in no acute distress.  Patient's stated mood is depressed. Despite patient appearing tearful at times, her affect is mostly non-congruent, and flat throughout the assessment. Patient's speech is normal when she answers questions, but she appears to be demonstrating thought blocking and will take a prolonged amount of time to process questions before responding to questions.  Patient is alert and oriented x4, cooperative, and answers all questions appropriately.  Patient does not appear to be responding to internal or external stimuli."    Stay Summary: Patient was assessed by myself upon her arrival to the [facility] on 07/02/20. Based on my assessment, decision was made at that time to admit the patient to [facility] continuous observation/assessment for further stabilization and treatment and to have the patient reevaluated by psychiatry later this morning on 07/02/20 to determine disposition at that time. However, patient's PCR COVID test came back POSITIVE  while the patient was admitted to the [facility] continuous assessment unit. Options were discussed with the patient regarding the patient being transferred to the ED and being reevaluated by psychiatry via telepsych in the ED vs being discharged home. Patient stated that she wanted to be discharged home. Patient denied SI, HI, or AVH prior to discharge and patient could  contract for safety to be discharged home.   Total Time spent with patient: 15 minutes  Past Psychiatric History: Schizoaffective Disorder Past Medical History:  Past Medical History:  Diagnosis Date  . Arthritis   . Bipolar 1 disorder ([place])   . Bronchitis   . Depression   . Hemorrhoid   . Schizoaffective disorder ([place])   . Schizophrenia ([place])    Pt denies and reports it is schizoaffective disorder.   . Transfusion history     Past Surgical History:  Procedure Laterality Date  . CESAREAN SECTION     Family History:  Family History  Problem Relation Age of Onset  . Diabetes type II Other     Social History:  Social History   Substance and Sexual Activity  Alcohol Use No     Social History   Substance and Sexual Activity  Drug Use No  . Types: Marijuana   Comment: Former user    Social History   Socioeconomic History  . Marital status: Single    Spouse name: Not on file  . Number of children: Not  on file  . Years of education: Not on file  . Highest education level: Not on file  Occupational History  . Occupation: unemployed  Tobacco Use  . Smoking status: Current Every Day Smoker    Packs/day: 0.50    Years: 17.00    Pack years: 8.50    Types: Cigarettes  . Smokeless tobacco: Never Used  Vaping Use  . Vaping Use: Never used  Substance and Sexual Activity  . Alcohol use: No  . Drug use: No    Types: Marijuana    Comment: Former user  . Sexual activity: Not Currently  Other Topics Concern  . Not on file  Social History Narrative   ** Merged History Encounter **       Social Determinants of Health   Financial Resource Strain: Not on file  Food Insecurity: Not on file  Transportation Needs: Not on file  Physical Activity: Not on file  Stress: Not on file  Social Connections: Not on file   SDOH:  SDOH Screenings   Alcohol Screen: Low Risk   . Last Alcohol Screening Score (AUDIT): 0  Depression (PHQ2-9): Not on file  Financial Resource  Strain: Not on file  Food Insecurity: Not on file  Housing: Not on file  Physical Activity: Not on file  Social Connections: Not on file  Stress: Not on file  Tobacco Use: High Risk  . Smoking Tobacco Use: Current Every Day Smoker  . Smokeless Tobacco Use: Never Used  Transportation Needs: Not on file    Has this patient used any form of tobacco in the last 30 days? (Cigarettes, Smokeless Tobacco, Cigars, and/or Pipes) Prescription not provided because: patient does not use tobacco products   Current Medications:  Current Facility-Administered Medications  Medication Dose Route Frequency Provider Last Rate Last Admin  . acetaminophen (TYLENOL) tablet 650 mg  650 mg Oral Q6H PRN Prescilla Sours, PA-C      . alum & mag hydroxide-simeth (MAALOX/MYLANTA) 200-200-20 MG/5ML suspension 30 mL  30 mL Oral Q4H PRN Margorie John W, PA-C      . hydrOXYzine (ATARAX/VISTARIL) tablet 25 mg  25 mg Oral TID PRN Prescilla Sours, PA-C      . magnesium hydroxide (MILK OF MAGNESIA) suspension 30 mL  30 mL Oral Daily PRN Margorie John W, PA-C      . traZODone (DESYREL) tablet 50 mg  50 mg Oral QHS PRN Prescilla Sours, PA-C       Current Outpatient Medications  Medication Sig Dispense Refill  . SEROQUEL 100 MG tablet Take 100 mg by mouth at bedtime.      PTA Medications: (Not in a hospital admission)   Musculoskeletal  Strength & Muscle Tone: within normal limits Gait & Station: normal Patient leans: N/A  Psychiatric Specialty Exam  Presentation  General Appearance: Appropriate for Environment; Casual; Well Groomed  Eye Contact:Fair  Speech:Clear and Coherent; Normal Rate  Speech Volume:Normal  Handedness:Left   Mood and Affect  Mood:Euthymic  Affect:Congruent; Flat   Thought Process  Thought Processes:Coherent; Goal Directed  Descriptions of Associations:Intact  Orientation:Full (Time, Place and Person)  Thought Content:WDL  Hallucinations:Hallucinations: None  Ideas of  Reference:None  Suicidal Thoughts:Suicidal Thoughts: No  Homicidal Thoughts:Homicidal Thoughts: No   Sensorium  Memory:Immediate Fair; Recent Fair; Remote Fair  Judgment:Fair  Insight:Fair   Executive Functions  Concentration:Fair  Attention Span:Fair  [place]   Psychomotor Activity  Psychomotor Activity:Psychomotor Activity: Normal   Assets  Assets:Communication Skills; Desire for Improvement; Financial Resources/Insurance; Housing; Leisure Time; Physical Health; Resilience   Sleep  Sleep:Sleep: Good Number of Hours of Sleep: 8   Nutritional Assessment (For OBS and FBC admissions only) Has the patient had a weight loss or gain of 10 pounds or more in the last 3 months?: Yes Has the patient had a decrease in food intake/or appetite?: No Does the patient have dental problems?: No Does the patient have eating habits or behaviors that may be indicators of an eating disorder including binging or inducing vomiting?: No Has the patient recently lost weight without trying?: No Has the patient been eating poorly because of a decreased appetite?: No Malnutrition Screening Tool Score: 0    Physical Exam  Physical Exam Vitals reviewed.  Constitutional:      General: She is not in acute distress.    Appearance: She is not ill-appearing, toxic-appearing or diaphoretic.  HENT:     Head: Normocephalic and atraumatic.     Right Ear: External ear normal.     Left Ear: External ear normal.  Cardiovascular:     Rate and Rhythm: Normal rate.  Pulmonary:     Effort: Pulmonary effort is normal. No respiratory distress.  Musculoskeletal:        General: Normal range of motion.     Cervical back: Normal range of motion.  Neurological:     Mental Status: She is alert and oriented to person, place, and time.  Psychiatric:        Attention and Perception: She does not perceive auditory or visual hallucinations.        Mood and  Affect: Mood normal.        Speech: Speech normal.        Behavior: Behavior normal. Behavior is not agitated, slowed, aggressive, withdrawn, hyperactive or combative. Behavior is cooperative.        Thought Content: Thought content is not paranoid or delusional. Thought content does not include homicidal or suicidal ideation.     Comments: Mood is euthymic with congruent affect. Judgement fair and insight fair.     Review of Systems  Constitutional: Negative for chills, diaphoresis, fever, malaise/fatigue and weight loss.  HENT: Negative for congestion.   Respiratory: Negative for cough and shortness of breath.   Cardiovascular: Negative for chest pain and palpitations.  Gastrointestinal: Negative for abdominal pain, constipation, diarrhea, nausea and vomiting.  Musculoskeletal: Negative for joint pain and myalgias.  Neurological: Negative for dizziness and headaches.  Psychiatric/Behavioral: Positive for depression. Negative for hallucinations, memory loss and suicidal ideas. The patient does not have insomnia.   All other systems reviewed and are negative.    Vitals: Blood pressure 136/83, pulse 96, temperature (!) 97.1 F (36.2 C), temperature source Tympanic, resp. rate 18, SpO2 100 %. There is no height or weight on file to calculate BMI.  Demographic Factors:  Living alone and Unemployed  Loss Factors: Loss of significant relationship  Historical Factors: NA  Risk Reduction Factors:   Positive coping skills or problem solving skills  Continued Clinical Symptoms:  Schizophrenia:   Paranoid or undifferentiated type Previous Psychiatric Diagnoses and Treatments   Cognitive Features That Contribute To Risk:  None    Suicide Risk:  Minimal: No identifiable suicidal ideation.  Patients presenting with mild risk factors and with morbid ruminations; may be classified as minimal risk based on the severity of the depressive symptoms  Plan Of Care/Follow-up recommendations:   Activity:  As Tolerated  Diet:  No Dietary  Restrictions  Tests:  None Other:  N/A  Patient denies SI, HI, or AVH and does not meet inpatient psychiatric treatment criteria.  Recommend that patient follow up with her ACT Team and Dr. Sheppard Evens regarding her current psychotropic home medication regimen and potential medication management adjustments as well as initiating therapy with the new therapist with her Act team that she mentioned during my previous assessment. Was unable to verify the patient's home medications with her while she was at the [facility]. Initially upon patient's admission to continuous assessment, recommended that pharmacy contact patient's ACT team during the day on 07/02/20 to attempt to correctly verify the patient's home medications. However, since this was unable to happen due to patient testing positive for COVID, patient not meeting inpatient psychiatric treatment criteria at this time (patient denies SI, HI, or AVH currently) and patient wanting to go home rather than being transferred to the ED to be assessed by psychiatry later this morning via telepsych, recommend that patient follow up with her ACT Team and Dr. Sheppard Evens regarding her correct medications and follow their directions to take her correct medications as prescribed. Patient verbalizes understanding and agreement of this plan.   Safety planning done at length with the patient regarding appropriate actions to take/resources to utilize [facility], 911, nearest ED, Suicide Prevention Lifeline) if the patient becomes suicidal, homicidal, or if her condition rapidly deteriorates/worsens/does not improve. Patient verbalized understanding and agreement of this plan. This safety plan information was included in the patient's AVS and this information was reviewed with the patient as well. Entire AVS reviewed with the patient.  Patient verbalizes understanding and agreement of the overall plan. Patient can contract for safety and states  that if she is to go home, she will not try to harm/kill herself.  All of patient's questions answered ans concerns addressed.  Disposition: Patient to be discharged home.   Prescilla Sours, PA-C 07/02/2020, 7:26 AM

## 2020-07-02 NOTE — ED Provider Notes (Signed)
Behavioral Health Admission H&P [name] & OBS)  Date: 07/02/20 Patient Name: Clarita Mcelvain MRN: [id number] Chief Complaint:  Chief Complaint  Patient presents with  . Depression   Chief Complaint/Presenting Problem: Depression, SI, HI (passive)  Diagnoses:  Final diagnoses:  Schizoaffective disorder, unspecified type ([place])  Suicidal ideation    HPI: Destry Bezdek is a 50 year old female with a history of schizoaffective disorder who presents to [facility] voluntarily via EMS for SI and HI.  When patient is asked why she was brought to [facility], patient states that "I'm just not feeling right.  I have been crying and really depressed.  I just got some meds on board.  Things are not going right right now".  Patient reports feeling severely increased depression recently due to her cousin passing away 2 days ago.  Patient becomes tearful on exam when she mentions the passing of her cousin.  Patient states that she is "really sad" and she states that "my sadness is a 10 out of 10 right now".  Patient denies active or passive SI on exam.  Patient denies HI on exam as well.  Patient also denies reporting SI or HI to EMS prior to [facility] arrival.  However, per EMS, patient reported to EMS that she was having suicidal and homicidal thoughts currently.  EMS states that when they asked the patient to describe her suicidal homicidal thoughts in further detail, patient declined to provide further detail.  EMS also reports that prior to [facility], they asked the patient if she was currently having thoughts of wanting to harm herself or others and the patient replied "Yes". EMS reports that when they asked the patient to provide further details regarding these thoughts, patient declined.  Patient denies any history of past suicide attempts.  She denies any history of intentionally cutting or burning herself.  Patient denies AVH, paranoia, or delusions on exam.  Patient reports that she has an ACT Team  and that she sees Dr. [name] for medication management.  She reports that she saw Dr. [name] last week and that she had an appointment on July 01, 2020, in which she had "an injection".  Patient is unable to recall the name of the medication/type of injection that she had on July 01, 2020, but she states that she thinks that the name of the medication may "start with an M".  Patient reports that this is only the second time she has had this injection and that she was supposed to be receiving the injection every other month.  Patient states that she is not currently seeing a therapist and that she is supposed to be set up with a new therapist soon through her ACT team, but she does not know when this will occur.  Patient states that in addition to her every other month injection, the only other medication she is taking are Seroquel 400 mg p.o. at bedtime and Seroquel 200 mg p.o. during the day.  However, patient's chart shows history of other medications such as trazodone 50 mg p.o. PRN at bedtime for sleep and MDD, Tegretol 200 mg p.o. daily for mood stabilization, Vistaril 25 mg p.o. 3 times daily PRN for anxiety, Artane 2 mg p.o. twice daily with meals for EPS caused by medications, Aristada 982 mg injection every 30 days, and Seroquel 100 mg p.o. daily at bedtime.  Unable to find any ACT team records in the patient's chart. Chart review shows that patient was admitted to [facility] continuous observation/assessment  on March 11, 2020 and March 19, 2020.  Chart review also shows that patient was admitted to [facility] for hallucinations and schizoaffective disorder on November 28, 2019 and was discharged on November 30, 2019 with the carbamazepine, Vistaril, trazodone, and Artane prescriptions mentioned previously.  Patient reports she has been sleeping well, about 8 hours per night.  Patient endorses feelings of anhedonia, as well as feelings of guilt, hopelessness, and worthlessness.  Patient denies any energy or  concentration changes.  Patient reports increase in appetite and she reports a 40 pound weight gain over an unknown timeframe.  Patient denies any alcohol or drug use.  Per chart review, patient's last UDS from March 19, 2020 was positive for marijuana.  Patient does endorse smoking 1.5 packs/day of cigarettes since the age of 81.  Patient states that she lives by herself in [place].  She denies any access to guns or weapons.  Patient states that she is unemployed and that she has no sources of social support.  She denies being a past victim of verbal, physical, or sexual abuse.  On exam, patient is sitting in a chair, appearing tired and tearful at times, but in no acute distress.  Patient's stated mood is depressed. Despite patient appearing tearful at times, her affect is mostly non-congruent, and flat throughout the assessment. Patient's speech is normal when she answers questions, but she appears to be demonstrating thought blocking and will take a prolonged amount of time to process questions before responding to questions.  Patient is alert and oriented x4, cooperative, and answers all questions appropriately.  Patient does not appear to be responding to internal or external stimuli.  PHQ 2-9:   [place] ED from 03/19/2020 in [facility] Admission (Discharged) from 11/28/2019 in [place] [facility] ED from 11/27/2019 in [place] No Risk Low Risk Low Risk       Total Time spent with patient: 30 minutes  Musculoskeletal  Strength & Muscle Tone: within normal limits Gait & Station: normal Patient leans: N/A  Psychiatric Specialty Exam  Presentation General Appearance: Well Groomed; Casual (Tearful at times.)  Eye Contact:Fleeting  Speech:Clear and Coherent; Normal Rate  Speech Volume:Normal  Handedness:Right   Mood and Affect  Mood:Depressed  Affect:Non-Congruent;  Flat   Thought Process  Thought Processes:Coherent; Goal Directed  Descriptions of Associations:Intact  Orientation:Full (Time, Place and Person)  Thought Content:-- (Patient appears to be experiencing thought blocking.)   Hallucinations:Hallucinations: None  Ideas of Reference:None  Suicidal Thoughts:Suicidal Thoughts: -- (Patient denies SI on exam, but per EMS, patient expressed SI to EMS prior to her being brought to [facility].)  Homicidal Thoughts:Homicidal Thoughts: -- (Patient denies HI on exam, but per EMS, patient expressed HI to EMS prior to her being brought to [facility].)   [place]; Recent Fair; Remote Fair  Judgment:Fair  Insight:Fair   Executive Functions  Concentration:Fair  Attention Span:Fair  [place]   Psychomotor Activity  Psychomotor Activity:Psychomotor Activity: Normal   Assets  Assets:Communication Skills; Desire for Improvement; Financial Resources/Insurance; Housing; Leisure Time; Physical Health; Resilience   Sleep  Sleep:Sleep: Good Number of Hours of Sleep: 8   Nutritional Assessment (For OBS and FBC admissions only) Has the patient had a weight loss or gain of 10 pounds or more in the last 3 months?: Yes Has the patient had a decrease in food intake/or appetite?: No Does the patient have  dental problems?: No Does the patient have eating habits or behaviors that may be indicators of an eating disorder including binging or inducing vomiting?: No Has the patient recently lost weight without trying?: No Has the patient been eating poorly because of a decreased appetite?: No Malnutrition Screening Tool Score: 0    Physical Exam Vitals reviewed.  Constitutional:      General: She is not in acute distress.    Appearance: She is not ill-appearing, toxic-appearing or diaphoretic.  HENT:     Head: Normocephalic and atraumatic.     Right Ear: External ear normal.      Left Ear: External ear normal.  Cardiovascular:     Rate and Rhythm: Normal rate.  Pulmonary:     Effort: Pulmonary effort is normal. No respiratory distress.  Musculoskeletal:        General: Normal range of motion.     Cervical back: Normal range of motion.  Neurological:     Mental Status: She is alert and oriented to person, place, and time.  Psychiatric:        Attention and Perception: She does not perceive auditory or visual hallucinations.        Mood and Affect: Mood is depressed.        Speech: Speech normal.        Behavior: Behavior is not agitated, slowed, aggressive, withdrawn, hyperactive or combative. Behavior is cooperative.        Thought Content: Thought content is not paranoid or delusional.     Comments: Despite patient appearing tearful at times, her affect is mostly non-congruent, and flat throughout the assessment. Patient denies active or passive SI on exam.  Patient denies HI on exam as well.  Patient also denies reporting SI or HI to EMS prior to [facility] arrival.  However, per EMS, patient reported to EMS that she was having suicidal and homicidal thoughts currently.  EMS states that when they asked the patient to describe her suicidal homicidal thoughts in further detail, patient declined to provide further detail.  EMS also reports that prior to transporting the patient to [facility], they asked the patient if she was currently having thoughts of wanting to harm herself or others and the patient replied "Yes". EMS reports that when they asked the patient to provide further details regarding these thoughts, patient declined. Judgement fair and insight fair.    Review of Systems  Constitutional: Positive for malaise/fatigue. Negative for chills, diaphoresis, fever and weight loss.       Positive for appetite increase and weight gain.  HENT: Negative for congestion.   Respiratory: Negative for cough and shortness of breath.   Cardiovascular: Negative for chest pain and  palpitations.  Gastrointestinal: Negative for abdominal pain, constipation, diarrhea, nausea and vomiting.  Musculoskeletal: Negative for joint pain and myalgias.  Neurological: Negative for dizziness and headaches.  Psychiatric/Behavioral: Positive for depression. Negative for hallucinations, memory loss and substance abuse. The patient is nervous/anxious. The patient does not have insomnia.     Vitals: Blood pressure 136/83, pulse 96, temperature (!) 97.1 F (36.2 C), temperature source Tympanic, resp. rate 18, SpO2 100 %. There is no height or weight on file to calculate BMI.  Past Psychiatric History: Schizoaffective disorder  Is the patient at risk to self? Yes  Has the patient been a risk to self in the past 6 months? Yes .    Has the patient been a risk to self within the distant past? Yes   Is the  patient a risk to others? No   Has the patient been a risk to others in the past 6 months? No   Has the patient been a risk to others within the distant past? No   Past Medical History:  Past Medical History:  Diagnosis Date  . Arthritis   . Bipolar 1 disorder ([place])   . Bronchitis   . Depression   . Hemorrhoid   . Schizoaffective disorder ([place])   . Schizophrenia ([place])    Pt denies and reports it is schizoaffective disorder.   . Transfusion history     Past Surgical History:  Procedure Laterality Date  . CESAREAN SECTION      Family History:  Family History  Problem Relation Age of Onset  . Diabetes type II Other     Social History:  Social History   Socioeconomic History  . Marital status: Single    Spouse name: Not on file  . Number of children: Not on file  . Years of education: Not on file  . Highest education level: Not on file  Occupational History  . Occupation: unemployed  Tobacco Use  . Smoking status: Current Every Day Smoker    Packs/day: 0.50    Years: 17.00    Pack years: 8.50    Types: Cigarettes  . Smokeless tobacco: Never Used  Vaping Use   . Vaping Use: Never used  Substance and Sexual Activity  . Alcohol use: No  . Drug use: No    Types: Marijuana    Comment: Former user  . Sexual activity: Not Currently  Other Topics Concern  . Not on file  Social History Narrative   ** Merged History Encounter **       Social Determinants of Health   Financial Resource Strain: Not on file  Food Insecurity: Not on file  Transportation Needs: Not on file  Physical Activity: Not on file  Stress: Not on file  Social Connections: Not on file  Intimate Partner Violence: Not on file    SDOH:  SDOH Screenings   Alcohol Screen: Low Risk   . Last Alcohol Screening Score (AUDIT): 0  Depression (PHQ2-9): Not on file  Financial Resource Strain: Not on file  Food Insecurity: Not on file  Housing: Not on file  Physical Activity: Not on file  Social Connections: Not on file  Stress: Not on file  Tobacco Use: High Risk  . Smoking Tobacco Use: Current Every Day Smoker  . Smokeless Tobacco Use: Never Used  Transportation Needs: Not on file    Last Labs:  Admission on 07/02/2020  Component Date Value Ref Range Status  . SARS Coronavirus 2 Ag 07/02/2020 NEGATIVE  NEGATIVE Final   Comment: (NOTE) SARS-CoV-2 antigen NOT DETECTED.   Negative results are presumptive.  Negative results do not preclude SARS-CoV-2 infection and should not be used as the sole basis for treatment or other patient management decisions, including infection  control decisions, particularly in the presence of clinical signs and  symptoms consistent with COVID-19, or in those who have been in contact with the virus.  Negative results must be combined with clinical observations, patient history, and epidemiological information. The expected result is Negative.  Fact Sheet for Patients: HandmadeRecipes.com.cy  Fact Sheet for Healthcare Providers: FuneralLife.at  This test is not yet approved or cleared by the  [place] FDA and  has been authorized for detection and/or diagnosis of SARS-CoV-2 by FDA under an Emergency Use Authorization (EUA).  This EUA will  remain in effect (meaning this test can be used) for the duration of  the COV                          ID-19 declaration under Section 564(b)(1) of the Act, 21 U.S.C. section 360bbb-3(b)(1), unless the authorization is terminated or revoked sooner.    Admission on 03/19/2020, Discharged on 03/20/2020  Component Date Value Ref Range Status  . SARS Coronavirus 2 by RT PCR 03/19/2020 NEGATIVE  NEGATIVE Final   Comment: (NOTE) SARS-CoV-2 target nucleic acids are NOT DETECTED.  The SARS-CoV-2 RNA is generally detectable in upper respiratory specimens during the acute phase of infection. The lowest concentration of SARS-CoV-2 viral copies this assay can detect is 138 copies/mL. A negative result does not preclude SARS-Cov-2 infection and should not be used as the sole basis for treatment or other patient management decisions. A negative result may occur with  improper specimen collection/handling, submission of specimen other than nasopharyngeal swab, presence of viral mutation(s) within the areas targeted by this assay, and inadequate number of viral copies(<138 copies/mL). A negative result must be combined with clinical observations, patient history, and epidemiological information. The expected result is Negative.  Fact Sheet for Patients:  EntrepreneurPulse.com.au  Fact Sheet for Healthcare Providers:  IncredibleEmployment.be  This test is no                          t yet approved or cleared by the [place] FDA and  has been authorized for detection and/or diagnosis of SARS-CoV-2 by FDA under an Emergency Use Authorization (EUA). This EUA will remain  in effect (meaning this test can be used) for the duration of the COVID-19 declaration under Section 564(b)(1) of the Act,  21 U.S.C.section 360bbb-3(b)(1), unless the authorization is terminated  or revoked sooner.      . Influenza A by PCR 03/19/2020 NEGATIVE  NEGATIVE Final  . Influenza B by PCR 03/19/2020 NEGATIVE  NEGATIVE Final   Comment: (NOTE) The Xpert Xpress SARS-CoV-2/FLU/RSV plus assay is intended as an aid in the diagnosis of influenza from Nasopharyngeal swab specimens and should not be used as a sole basis for treatment. Nasal washings and aspirates are unacceptable for Xpert Xpress SARS-CoV-2/FLU/RSV testing.  Fact Sheet for Patients: EntrepreneurPulse.com.au  Fact Sheet for Healthcare Providers: IncredibleEmployment.be  This test is not yet approved or cleared by the [place] FDA and has been authorized for detection and/or diagnosis of SARS-CoV-2 by FDA under an Emergency Use Authorization (EUA). This EUA will remain in effect (meaning this test can be used) for the duration of the COVID-19 declaration under Section 564(b)(1) of the Act, 21 U.S.C. section 360bbb-3(b)(1), unless the authorization is terminated or revoked.  Performed at [facility], [place] [address], [place], [place] [address]   . SARS Coronavirus 2 Ag 03/19/2020 Negative  Negative Preliminary  . POC Amphetamine UR 03/19/2020 None Detected  None Detected Final  . POC Secobarbital (BAR) 03/19/2020 None Detected  None Detected Final  . POC Buprenorphine (BUP) 03/19/2020 None Detected  None Detected Final  . POC Oxazepam (BZO) 03/19/2020 None Detected  None Detected Final  . POC Cocaine UR 03/19/2020 None Detected  None Detected Final  . POC Methamphetamine UR 03/19/2020 None Detected  None Detected Final  . POC Morphine 03/19/2020 None Detected  None Detected Final  . POC Oxycodone UR 03/19/2020 None Detected  None Detected Final  . POC  Methadone UR 03/19/2020 None Detected  None Detected Final  . POC Marijuana UR 03/19/2020 Positive* None Detected Final  Admission on  03/11/2020, Discharged on 03/12/2020  Component Date Value Ref Range Status  . WBC 03/11/2020 11.1* 4.0 - 10.5 K/uL Final  . RBC 03/11/2020 4.10  3.87 - 5.11 MIL/uL Final  . Hemoglobin 03/11/2020 10.0* 12.0 - 15.0 g/dL Final  . HCT 03/11/2020 32.4* 36.0 - 46.0 % Final  . MCV 03/11/2020 79.0* 80.0 - 100.0 fL Final  . MCH 03/11/2020 24.4* 26.0 - 34.0 pg Final  . MCHC 03/11/2020 30.9  30.0 - 36.0 g/dL Final  . RDW 03/11/2020 16.7* 11.5 - 15.5 % Final  . Platelets 03/11/2020 401* 150 - 400 K/uL Final  . nRBC 03/11/2020 0.0  0.0 - 0.2 % Final  . Neutrophils Relative % 03/11/2020 64  % Final  . Neutro Abs 03/11/2020 7.1  1.7 - 7.7 K/uL Final  . Lymphocytes Relative 03/11/2020 30  % Final  . Lymphs Abs 03/11/2020 3.3  0.7 - 4.0 K/uL Final  . Monocytes Relative 03/11/2020 4  % Final  . Monocytes Absolute 03/11/2020 0.5  0.1 - 1.0 K/uL Final  . Eosinophils Relative 03/11/2020 1  % Final  . Eosinophils Absolute 03/11/2020 0.1  0.0 - 0.5 K/uL Final  . Basophils Relative 03/11/2020 0  % Final  . Basophils Absolute 03/11/2020 0.0  0.0 - 0.1 K/uL Final  . Immature Granulocytes 03/11/2020 1  % Final  . Abs Immature Granulocytes 03/11/2020 0.06  0.00 - 0.07 K/uL Final   Performed at [facility], [place] [address], [place], [place] [address]  . Sodium 03/11/2020 134* 135 - 145 mmol/L Final  . Potassium 03/11/2020 3.7  3.5 - 5.1 mmol/L Final  . Chloride 03/11/2020 101  98 - 111 mmol/L Final  . CO2 03/11/2020 23  22 - 32 mmol/L Final  . Glucose, Bld 03/11/2020 125* 70 - 99 mg/dL Final   Glucose reference range applies only to samples taken after fasting for at least 8 hours.  . BUN 03/11/2020 7  6 - 20 mg/dL Final  . Creatinine, Ser 03/11/2020 0.94  0.44 - 1.00 mg/dL Final  . Calcium 03/11/2020 8.7* 8.9 - 10.3 mg/dL Final  . Total Protein 03/11/2020 8.4* 6.5 - 8.1 g/dL Final  . Albumin 03/11/2020 3.7  3.5 - 5.0 g/dL Final  . AST 03/11/2020 11* 15 - 41 U/L Final  . ALT 03/11/2020 7  0 - 44 U/L  Final  . Alkaline Phosphatase 03/11/2020 74  38 - 126 U/L Final  . Total Bilirubin 03/11/2020 0.3  0.3 - 1.2 mg/dL Final  . GFR, Estimated 03/11/2020 >60  >60 mL/min Final   Comment: (NOTE) Calculated using the CKD-EPI Creatinine Equation (2021)   . Anion gap 03/11/2020 10  5 - 15 Final   Performed at [facility] [address] [place], [place] [address]  . SARS Coronavirus 2 Ag 03/11/2020 Negative  Negative Preliminary  . SARS Coronavirus 2 by RT PCR 03/11/2020 NEGATIVE  NEGATIVE Final   Comment: (NOTE) SARS-CoV-2 target nucleic acids are NOT DETECTED.  The SARS-CoV-2 RNA is generally detectable in upper respiratoy specimens during the acute phase of infection. The lowest concentration of SARS-CoV-2 viral copies this assay can detect is 131 copies/mL. A negative result does not preclude SARS-Cov-2 infection and should not be used as the sole basis for treatment or other patient management decisions. A negative result may occur with  improper specimen collection/handling,  submission of specimen other than nasopharyngeal swab, presence of viral mutation(s) within the areas targeted by this assay, and inadequate number of viral copies (<131 copies/mL). A negative result must be combined with clinical observations, patient history, and epidemiological information. The expected result is Negative.  Fact Sheet for Patients:  PinkCheek.be  Fact Sheet for Healthcare Providers:  GravelBags.it  This test is no                          t yet approved or cleared by the [place] FDA and  has been authorized for detection and/or diagnosis of SARS-CoV-2 by FDA under an Emergency Use Authorization (EUA). This EUA will remain  in effect (meaning this test can be used) for the duration of the COVID-19 declaration under Section 564(b)(1) of the Act, 21 U.S.C. section 360bbb-3(b)(1), unless the authorization is terminated  or revoked sooner.    . Influenza A by PCR 03/11/2020 NEGATIVE  NEGATIVE Final  . Influenza B by PCR 03/11/2020 NEGATIVE  NEGATIVE Final   Comment: (NOTE) The Xpert Xpress SARS-CoV-2/FLU/RSV assay is intended as an aid in  the diagnosis of influenza from Nasopharyngeal swab specimens and  should not be used as a sole basis for treatment. Nasal washings and  aspirates are unacceptable for Xpert Xpress SARS-CoV-2/FLU/RSV  testing.  Fact Sheet for Patients: PinkCheek.be  Fact Sheet for Healthcare Providers: GravelBags.it  This test is not yet approved or cleared by the [place] FDA and  has been authorized for detection and/or diagnosis of SARS-CoV-2 by  FDA under an Emergency Use Authorization (EUA). This EUA will remain  in effect (meaning this test can be used) for the duration of the  Covid-19 declaration under Section 564(b)(1) of the Act, 21  U.S.C. section 360bbb-3(b)(1), unless the authorization is  terminated or revoked. Performed at [facility], [place] [address] [place], [place] [address]   Admission on 02/23/2020, Discharged on 02/23/2020  Component Date Value Ref Range Status  . SARS Coronavirus 2 by RT PCR 02/23/2020 NEGATIVE  NEGATIVE Final   Comment: (NOTE) SARS-CoV-2 target nucleic acids are NOT DETECTED.  The SARS-CoV-2 RNA is generally detectable in upper respiratoy specimens during the acute phase of infection. The lowest concentration of SARS-CoV-2 viral copies this assay can detect is 131 copies/mL. A negative result does not preclude SARS-Cov-2 infection and should not be used as the sole basis for treatment or other patient management decisions. A negative result may occur with  improper specimen collection/handling, submission of specimen other than nasopharyngeal swab, presence of viral mutation(s) within the areas targeted by this assay, and inadequate number of viral copies (<131  copies/mL). A negative result must be combined with clinical observations, patient history, and epidemiological information. The expected result is Negative.  Fact Sheet for Patients:  PinkCheek.be  Fact Sheet for Healthcare Providers:  GravelBags.it  This test is no                          t yet approved or cleared by the [place] FDA and  has been authorized for detection and/or diagnosis of SARS-CoV-2 by FDA under an Emergency Use Authorization (EUA). This EUA will remain  in effect (meaning this test can be used) for the duration of the COVID-19 declaration under Section 564(b)(1) of the Act, 21 U.S.C. section 360bbb-3(b)(1), unless the authorization is terminated or revoked sooner.    . Influenza A by  PCR 02/23/2020 NEGATIVE  NEGATIVE Final  . Influenza B by PCR 02/23/2020 NEGATIVE  NEGATIVE Final   Comment: (NOTE) The Xpert Xpress SARS-CoV-2/FLU/RSV assay is intended as an aid in  the diagnosis of influenza from Nasopharyngeal swab specimens and  should not be used as a sole basis for treatment. Nasal washings and  aspirates are unacceptable for Xpert Xpress SARS-CoV-2/FLU/RSV  testing.  Fact Sheet for Patients: PinkCheek.be  Fact Sheet for Healthcare Providers: GravelBags.it  This test is not yet approved or cleared by the [place] FDA and  has been authorized for detection and/or diagnosis of SARS-CoV-2 by  FDA under an Emergency Use Authorization (EUA). This EUA will remain  in effect (meaning this test can be used) for the duration of the  Covid-19 declaration under Section 564(b)(1) of the Act, 21  U.S.C. section 360bbb-3(b)(1), unless the authorization is  terminated or revoked. Performed at [facility], [place] [address] [place],  [address]     Allergies: Anette Guarneri [lurasidone hcl], Abilify [aripiprazole], Flagyl  [metronidazole hcl], Prolixin [fluphenazine], Risperidone and related, Seroquel [quetiapine fumarate], and Sulfa antibiotics  PTA Medications: (Not in a hospital admission)   Medical Decision Making  Patient is a 50 year old female with a history of schizoaffective disorder who presents to [facility] voluntarily via EMS for SI and HI.  Patient denies SI or HI on exam and denies endorsing SI or HI to the EMS on 911 call or prior to [facility] arrival.  However, based on the discrepancy between my assessment and EMS's report that patient endorsed SI and HI to EMS and would not provide further details regarding her SI or HI to EMS, and patient's presentation, patient appears to be experiencing worsening depressive symptoms that are negatively impacting her activities of daily living and I believe that the patient is a potential threat to herself at this time.  Thus, recommend continuous observation/assessment for the patient at this time.    Recommendations  Based on my evaluation the patient does not appear to have an emergency medical condition.  Will place the patient in [facility] continuous observation/assessment for further stabilization and treatment.  Patient will be reevaluated by the treatment team on July 02, 2020 and disposition to be determined at that time.  Labs/Testing ordered: UDS, urine pregnancy, CBC with differential, CMP.  TSH, lipid panel, and hemoglobin A1c ordered due to patient's history of taking antipsychotic medication.  Although patient states that she is currently not taking carbamazepine, carbamazepine level ordered due to patient's history in her chart of taking/being prescribed carbamazepine.  EKG also ordered to assess the patient's QTC due to patient's history of taking antipsychotic medications.  Patient refused to provide a urine specimen, have blood work drawn, or to have an EKG done.  Recommend that nursing attempt to obtain a urine specimen, blood specimen, and EKG on the patient  during the day on July 02, 2020.  Patient's last EKG from November 27, 2019 reviewed, which showed no acute/concerning findings or signs of ischemia with a QTC of 428 ms.  Unable to successfully verify the patient's home medication regimen with the patient at this time. Vistaril 25 mg p.o. 3 times daily as needed for anxiety and trazodone 50 mg p.o. daily at bedtime as needed for sleep ordered as part of the patient's [facility] admission order set, but no additional home medications ordered at this time. Unable to find any ACT Team records in the patient's chart.  Recommend that pharmacy contact the patient's ACT team during  the day on July 02, 2020 to correctly verify the patient's home medications.   Prescilla Sours, PA-C 07/02/20  3:51 AM

## 2020-07-02 NOTE — ED Notes (Signed)
Pt discharged in no acute distress. A&O x4, ambulatory. Denied SI/HI/AVH. Verbalized understanding of AVS instructions reviewed by Margorie John, PA. Belongings returned to pt intact from locker #30. Pt given bus passes and escorted to front lobby by staff. Safety maintained.

## 2020-07-02 NOTE — ED Notes (Addendum)
Call from lab reporting pt is COVID +. Margorie John, PA and Lindon Romp, NP notified. Pt given option to go to ED or be discharged home. Pt requesting to go home.

## 2020-07-02 NOTE — Discharge Instructions (Addendum)
I was not able to verify your home medications during your visit. Contact your ACT Team regarding your correct medications and follow their directions to take your correct medications as prescribed.   Discharge recommendations:  Patient is to take medications as prescribed. Please see information for follow-up appointment with psychiatry and therapy. Please follow up with your primary care provider for all medical related needs.   Therapy: We recommend that patient participate in individual therapy to address mental health concerns.  Medications: The patient is to contact a medical professional and/or outpatient provider to address any new side effects that develop. Patient should update outpatient providers of any new medications and/or medication changes.   Atypical antipsychotics: If you are prescribed an atypical antipsychotic, it is recommended that your height, weight, BMI, blood pressure, fasting lipid panel, and fasting blood sugar be monitored by your outpatient providers.  Safety:  The patient should abstain from use of illicit substances/drugs and abuse of any medications. If symptoms worsen or do not continue to improve or if the patient becomes actively suicidal or homicidal then it is recommended that the patient return to the closest hospital emergency department, the [facility], or call 911 for further evaluation and treatment. National Suicide Prevention Lifeline 1-800-SUICIDE or 6167462564.

## 2020-07-02 NOTE — ED Notes (Signed)
Pt admitted to continuous assessment due to initially calling EMS and endorsing SI/HI without a plan. Pt A&O x4, calm. Pt currently denies SI/HI/AVH. Pt refused EKG, lab work, and to provide urine sample for UDS and pregnancy test. Pt tolerated skin assessment well. Pt ambulated independently to unit. Oriented to unit/staff. No signs of acute distress noted. Will continue to monitor for safety.

## 2020-07-03 ENCOUNTER — Emergency Department (HOSPITAL_COMMUNITY)
Admission: EM | Admit: 2020-07-03 | Discharge: 2020-07-03 | Disposition: A | Discharge status: Home / Self Care | Payer: Medicaid Other | Attending: Emergency Medicine | Admitting: Emergency Medicine

## 2020-07-03 ENCOUNTER — Telehealth: Payer: Self-pay

## 2020-07-03 ENCOUNTER — Other Ambulatory Visit: Payer: Self-pay

## 2020-07-03 ENCOUNTER — Encounter (HOSPITAL_COMMUNITY): Payer: Self-pay

## 2020-07-03 DIAGNOSIS — Z5321 Procedure and treatment not carried out due to patient leaving prior to being seen by health care provider: Secondary | ICD-10-CM | POA: Diagnosis not present

## 2020-07-03 DIAGNOSIS — R109 Unspecified abdominal pain: Secondary | ICD-10-CM | POA: Diagnosis not present

## 2020-07-03 DIAGNOSIS — T192XXA Foreign body in vulva and vagina, initial encounter: Secondary | ICD-10-CM | POA: Insufficient documentation

## 2020-07-03 DIAGNOSIS — X58XXXA Exposure to other specified factors, initial encounter: Secondary | ICD-10-CM | POA: Insufficient documentation

## 2020-07-03 NOTE — Telephone Encounter (Signed)
Called to discuss with patient about COVID-19 symptoms and the use of one of the available treatments for those with mild to moderate Covid symptoms and at a high risk of hospitalization.  Pt appears to qualify for outpatient treatment due to co-morbid conditions and/or a member of an at-risk group in accordance with the FDA Emergency Use Authorization.    Symptom onset: Unknown Vaccinated: Unknown Booster? Unknown Immunocompromised? No Qualifiers: HTN  Unable to reach pt - Left message and call back number [phone].   Marcello Moores

## 2020-07-03 NOTE — ED Triage Notes (Signed)
Pt BIB EMS. Pt reports having a tampon stuck in her vagina due to putting another one in before taking the first one out. Pt reports never using tampons before. Pt now endorses abdominal cramping.

## 2020-07-03 NOTE — ED Notes (Signed)
Pt called 3x for room placement. Eloped from waiting area.  

## 2020-07-11 ENCOUNTER — Emergency Department (HOSPITAL_COMMUNITY)
Admission: EM | Admit: 2020-07-11 | Discharge: 2020-07-11 | Disposition: A | Discharge status: Home / Self Care | Payer: Medicaid Other | Attending: Emergency Medicine | Admitting: Emergency Medicine

## 2020-07-11 ENCOUNTER — Encounter (HOSPITAL_COMMUNITY): Payer: Self-pay

## 2020-07-11 ENCOUNTER — Other Ambulatory Visit: Payer: Self-pay

## 2020-07-11 DIAGNOSIS — I10 Essential (primary) hypertension: Secondary | ICD-10-CM | POA: Diagnosis not present

## 2020-07-11 DIAGNOSIS — H04002 Unspecified dacryoadenitis, left lacrimal gland: Secondary | ICD-10-CM | POA: Insufficient documentation

## 2020-07-11 DIAGNOSIS — K0889 Other specified disorders of teeth and supporting structures: Secondary | ICD-10-CM | POA: Insufficient documentation

## 2020-07-11 DIAGNOSIS — F17210 Nicotine dependence, cigarettes, uncomplicated: Secondary | ICD-10-CM | POA: Diagnosis not present

## 2020-07-11 DIAGNOSIS — H5712 Ocular pain, left eye: Secondary | ICD-10-CM | POA: Diagnosis present

## 2020-07-11 MED ORDER — IBUPROFEN 200 MG PO TABS
600.0000 mg | ORAL_TABLET | Freq: Once | ORAL | Status: AC
  Administered 2020-07-11: 600 mg via ORAL
  Filled 2020-07-11: qty 3

## 2020-07-11 NOTE — ED Provider Notes (Incomplete)
[facility] DEPT Provider Note   CSN: [id number] Arrival date & time: 07/11/20  2778     History Chief Complaint  Patient presents with  . Eye Pain  . Foreign Body in [place] is a 50 y.o. female.  HPI     Past Medical History:  Diagnosis Date  . Arthritis   . Bipolar 1 disorder ([place])   . Bronchitis   . Depression   . Hemorrhoid   . Schizoaffective disorder ([place])   . Schizophrenia ([place])    Pt denies and reports it is schizoaffective disorder.   . Transfusion history     Patient Active Problem List   Diagnosis Date Noted  . Schizophrenia ([place]) 02/07/2019  . Schizoaffective disorder ([place]) 11/08/2018  . No diagnosis on Axis I 08/21/2015  . Abnormal behavior   . Involuntary commitment   . Psychosis ([place])   . Disturbance in affect   . Tobacco use disorder 05/12/2015  . Cannabis use disorder, moderate, dependence ([place]) 05/12/2015  . Schizo affective schizophrenia ([place]) 05/10/2015  . Hypertension 02/24/2011  . ANEMIA-IRON DEFICIENCY 12/13/2006  . GERD 12/13/2006  . OBESITY 12/12/2006  . Schizoaffective disorder, bipolar type ([place]) 12/12/2006  . HEMORRHOIDS, NOS 12/12/2006  . PAIN-NECK 12/12/2006  . Abnormal involuntary movement 12/12/2006    Past Surgical History:  Procedure Laterality Date  . CESAREAN SECTION       OB History   No obstetric history on file.     Family History  Problem Relation Age of Onset  . Diabetes type II Other   . Hypertension Mother   . Hypertension Father     Social History   Tobacco Use  . Smoking status: Current Every Day Smoker    Packs/day: 0.50    Years: 17.00    Pack years: 8.50    Types: Cigarettes  . Smokeless tobacco: Never Used  Vaping Use  . Vaping Use: Never used  Substance Use Topics  . Alcohol use: No  . Drug use: Not Currently    Types: Marijuana    Comment: Former user    Home Medications Prior to Admission medications   Medication Sig Start  Date End Date Taking? Authorizing Provider  SEROQUEL 100 MG tablet Take 100 mg by mouth at bedtime. 02/14/20   [provider]    Allergies    Anette Guarneri [lurasidone hcl], Abilify [aripiprazole], Flagyl [metronidazole hcl], Prolixin [fluphenazine], Risperidone and related, Seroquel [quetiapine fumarate], and Sulfa antibiotics  Review of Systems   Review of Systems  Physical Exam Updated Vital Signs BP (!) 145/75 (BP Location: Left Arm)   Pulse 78   Temp 98.8 F (37.1 C) (Oral)   Resp 18   Ht 5\' 4"  (1.626 m)   Wt 104.3 kg   LMP 06/27/2020 (Approximate)   SpO2 100%   BMI 39.48 kg/m   Physical Exam  ED Results / Procedures / Treatments   Labs (all labs ordered are listed, but only abnormal results are displayed) Labs Reviewed - No data to display  EKG None  Radiology No results found.  Procedures Procedures {Remember to document critical care time when appropriate:1}  Medications Ordered in ED Medications - No data to display  ED Course  I have reviewed the triage vital signs and the nursing notes.  Pertinent labs & imaging results that were available during my care of the patient were reviewed by me and considered in my medical decision making (see chart for details).  MDM Rules/Calculators/A&P                          *** Final Clinical Impression(s) / ED Diagnoses Final diagnoses:  None    Rx / DC Orders ED Discharge Orders    None

## 2020-07-11 NOTE — ED Provider Notes (Signed)
[facility] DEPT Provider Note   CSN: [id number] Arrival date & time: 07/11/20  2482     History Chief Complaint  Patient presents with  . Eye Pain  . Foreign Body in [place] is a 50 y.o. female.  [name] is a 50 y.o. female with a history of bipolar disorder, schizoaffective disorder, depression, arthritis, who presents to the ED via EMS with multiple complaints.  She reports that yesterday she began having some pain over the upper outer portion of her left eye and felt like there was a mass here that seemed new to her.  She reports that she has had some clear tearing from the eye but no purulent drainage, no eye redness, no pain to the eye itself or changes in vision, has not noted any similar pain or changes to the right eye.  Does not wear contacts or glasses.  Patient also reports that she is concerned that she has a retained tampon.  She reports that she thinks it has been stuck for about 2 weeks.  She tried to come in for this earlier but left prior to being seen.  Reports that she had put into tampons and thinks that she put in another one without taking out both and is concerned there is something stuck.  She denies any continued vaginal bleeding or discharge.  No dysuria.  No abdominal or pelvic pain, she just reports it feels like something is stuck in there.  Patient denies any fevers or chills.  No nausea or vomiting.  Patient also mentions some left upper dental pain reports that she has been having pain over this molar for a while and thinks she had a piece of her tooth break off yesterday and there is a hole in her tooth, she has not noted any redness swelling or drainage from this area, has not seen a dentist regarding this.  Has not taken anything to treat this pain.  No other aggravating or alleviating factors.        Past Medical History:  Diagnosis Date  . Arthritis   . Bipolar 1 disorder ([place])   .  Bronchitis   . Depression   . Hemorrhoid   . Schizoaffective disorder ([place])   . Schizophrenia ([place])    Pt denies and reports it is schizoaffective disorder.   . Transfusion history     Patient Active Problem List   Diagnosis Date Noted  . Schizophrenia ([place]) 02/07/2019  . Schizoaffective disorder ([place]) 11/08/2018  . No diagnosis on Axis I 08/21/2015  . Abnormal behavior   . Involuntary commitment   . Psychosis ([place])   . Disturbance in affect   . Tobacco use disorder 05/12/2015  . Cannabis use disorder, moderate, dependence ([place]) 05/12/2015  . Schizo affective schizophrenia ([place]) 05/10/2015  . Hypertension 02/24/2011  . ANEMIA-IRON DEFICIENCY 12/13/2006  . GERD 12/13/2006  . OBESITY 12/12/2006  . Schizoaffective disorder, bipolar type ([place]) 12/12/2006  . HEMORRHOIDS, NOS 12/12/2006  . PAIN-NECK 12/12/2006  . Abnormal involuntary movement 12/12/2006    Past Surgical History:  Procedure Laterality Date  . CESAREAN SECTION       OB History   No obstetric history on file.     Family History  Problem Relation Age of Onset  . Diabetes type II Other   . Hypertension Mother   . Hypertension Father     Social History   Tobacco Use  . Smoking status: Current Every Day  Smoker    Packs/day: 0.50    Years: 17.00    Pack years: 8.50    Types: Cigarettes  . Smokeless tobacco: Never Used  Vaping Use  . Vaping Use: Never used  Substance Use Topics  . Alcohol use: No  . Drug use: Not Currently    Types: Marijuana    Comment: Former user    Home Medications Prior to Admission medications   Medication Sig Start Date End Date Taking? Authorizing Provider  SEROQUEL 100 MG tablet Take 100 mg by mouth at bedtime. 02/14/20   [provider]    Allergies    Anette Guarneri [lurasidone hcl], Abilify [aripiprazole], Flagyl [metronidazole hcl], Prolixin [fluphenazine], Risperidone and related, Seroquel [quetiapine fumarate], and Sulfa antibiotics  Review of Systems    Review of Systems  Constitutional: Negative for chills and fever.  HENT: Positive for dental problem. Negative for facial swelling and trouble swallowing.   Eyes: Positive for pain. Negative for photophobia, discharge, redness, itching and visual disturbance.  Respiratory: Negative for shortness of breath.   Cardiovascular: Negative for chest pain.  Gastrointestinal: Negative for abdominal pain, nausea and vomiting.  Genitourinary: Negative for dysuria, pelvic pain, vaginal bleeding and vaginal discharge.  Skin: Negative for color change and rash.  Neurological: Negative for dizziness, light-headedness and headaches.  All other systems reviewed and are negative.   Physical Exam Updated Vital Signs BP (!) 145/75 (BP Location: Left Arm)   Pulse 78   Temp 98.8 F (37.1 C) (Oral)   Resp 18   Ht 5\' 4"  (1.626 m)   Wt 104.3 kg   LMP 06/27/2020 (Approximate)   SpO2 100%   BMI 39.48 kg/m   Physical Exam Vitals and nursing note reviewed.  Constitutional:      General: She is not in acute distress.    Appearance: Normal appearance. She is well-developed. She is obese. She is not ill-appearing or diaphoretic.     Comments: Well-appearing and in no distress   HENT:     Head: Normocephalic and atraumatic.     Mouth/Throat:     Mouth: Mucous membranes are moist.     Pharynx: Oropharynx is clear.     Comments: There is a small portion of one of the left upper molars that is broken away, the rest of the tooth is intact there is no surrounding erythema or signs of dental abscess, tooth itself is not significantly tender to palpation, posterior oropharynx is clear, normal phonation, no trismus, tolerating secretions. Eyes:     General:        Right eye: No discharge.        Left eye: No discharge.     Extraocular Movements: Extraocular movements intact.     Pupils: Pupils are equal, round, and reactive to light.     Comments: Bilateral eyes without erythema, no discharge noted. Patient  with prominent lacrimal ducts over both eyes which appear symmetric, the left duct is minimally tender to palpation with no expressible purulence.  No overlying erythema or warmth. PERRLA, EOMI.  No periorbital swelling or proptosis.  Cardiovascular:     Rate and Rhythm: Normal rate and regular rhythm.     Heart sounds: Normal heart sounds. No murmur heard. No friction rub. No gallop.   Pulmonary:     Effort: Pulmonary effort is normal. No respiratory distress.     Breath sounds: Normal breath sounds. No wheezing or rales.  Abdominal:     General: Bowel sounds are normal. There is no  distension.     Palpations: Abdomen is soft. There is no mass.     Tenderness: There is no abdominal tenderness. There is no guarding.     Comments: Abdomen soft, nondistended, nontender to palpation in all quadrants without guarding or peritoneal signs   Genitourinary:    Comments: Chaperone present during pelvic exam. No external genital lesions noted. On speculum exam there is no visualized retained tampon or any other foreign bodies noted  This was confirmed with bimanual exam where again no foreign bodies were found or palpated, patient with no cervical motion tenderness, no uterine or adnexal tenderness.  No visualized bleeding or discharge. Musculoskeletal:        General: No deformity.     Cervical back: Neck supple.  Skin:    General: Skin is warm and dry.     Capillary Refill: Capillary refill takes less than 2 seconds.  Neurological:     Mental Status: She is alert.     Coordination: Coordination normal.     Comments: Speech is clear, able to follow commands Moves extremities without ataxia, coordination intact  Psychiatric:        Mood and Affect: Mood normal.        Behavior: Behavior normal.     ED Results / Procedures / Treatments   Labs (all labs ordered are listed, but only abnormal results are displayed) Labs Reviewed - No data to display  EKG None  Radiology No results  found.  Procedures Procedures   Medications Ordered in ED Medications  ibuprofen (ADVIL) tablet 600 mg (600 mg Oral Given 07/11/20 2224)    ED Course  I have reviewed the triage vital signs and the nursing notes.  Pertinent labs & imaging results that were available during my care of the patient were reviewed by me and considered in my medical decision making (see chart for details).    MDM Rules/Calculators/A&P                         50 year old female presents with multiple complaints.  Reports pain above the outer portion of her left eye.  On exam she has prominent lacrimal ducts bilaterally these appear to be symmetric, there is no erythema injection or purulent discharge from the left eye, no eye pain or changes in vision.  Suspect some mild inflammation of the lacrimal duct.  Patient has been repeatedly pressing on and moving this area which I suspect is worsening her symptoms.  I discussed this patient provided reassurance and she expresses understanding do not feel that further emergent work-up or evaluation of this complaint is indicated.  Dr. Franne Grip with ophthalmology was in the department seeing another patient and briefly assessed patient's eye complaint and agreed and recommended outpatient follow-up.  Encourage NSAIDs and warm compresses as needed and counseled patient on avoiding touching or pressing on the area as this can worsen inflammation.  Patient also is concerned for retained tampon, no associated fevers or chills, no nausea vomiting, abdominal or pelvic pain and no vaginal discharge, on pelvic exam there is no retained tampon or other vaginal foreign body.  Provided patient with reassurance regarding this.  Patient also complaining of some dental pain, thinks she chipped her tooth earlier today, reports intermittent pain primarily with chewing, no signs of infection, tooth not significantly tender to palpation, encouraged NSAIDs, Tylenol provided resources for dental  follow-up.   At this time there does not appear to be any evidence of  an acute emergency medical condition and the patient appears stable for discharge with appropriate outpatient follow up.Diagnosis was discussed with patient who verbalizes understanding and is agreeable to discharge.   Final Clinical Impression(s) / ED Diagnoses Final diagnoses:  Dacryoadenitis of left lacrimal gland  Pain, dental    Rx / DC Orders ED Discharge Orders    None       Jacqlyn Larsen, [place] 07/13/20 0981    Merryl Hacker, MD 07/14/20 1230

## 2020-07-11 NOTE — Discharge Instructions (Addendum)
We did not see any tampons or other foreign bodies in the vagina.  For pain above your left eye, use Tylenol and ibuprofen and warm compresses, avoid touching or pressing on the area.  Avoid touching the eye as much as possible as this increases the risk for infection.  If this pain is not improving please call to schedule follow-up with Dr. Valetta Close with ophthalmology.  Ibuprofen and Tylenol will also help with your dental pain, please call to schedule follow-up with the dentist for further evaluation of broken tooth.

## 2020-07-11 NOTE — ED Triage Notes (Signed)
Per EMS- Patient c/o "left eye mass" that she noticed today.  patient also reports that she thinks she has a "lost tampon that has been stuck x 2 weeks." Patiaent states she was in the ED 2 weeks ago, but left prior to being seen.

## 2020-08-29 ENCOUNTER — Encounter (HOSPITAL_COMMUNITY): Payer: Self-pay | Admitting: Emergency Medicine

## 2020-08-29 ENCOUNTER — Emergency Department (HOSPITAL_COMMUNITY): Payer: Medicaid Other

## 2020-08-29 ENCOUNTER — Other Ambulatory Visit: Payer: Self-pay

## 2020-08-29 ENCOUNTER — Emergency Department (HOSPITAL_COMMUNITY)
Admission: EM | Admit: 2020-08-29 | Discharge: 2020-08-30 | Disposition: A | Discharge status: Home / Self Care | Payer: Medicaid Other | Attending: Emergency Medicine | Admitting: Emergency Medicine

## 2020-08-29 DIAGNOSIS — I10 Essential (primary) hypertension: Secondary | ICD-10-CM | POA: Insufficient documentation

## 2020-08-29 DIAGNOSIS — F259 Schizoaffective disorder, unspecified: Secondary | ICD-10-CM | POA: Diagnosis not present

## 2020-08-29 DIAGNOSIS — F200 Paranoid schizophrenia: Secondary | ICD-10-CM | POA: Diagnosis not present

## 2020-08-29 DIAGNOSIS — Z1231 Encounter for screening mammogram for malignant neoplasm of breast: Secondary | ICD-10-CM

## 2020-08-29 DIAGNOSIS — F17210 Nicotine dependence, cigarettes, uncomplicated: Secondary | ICD-10-CM | POA: Diagnosis not present

## 2020-08-29 DIAGNOSIS — F209 Schizophrenia, unspecified: Secondary | ICD-10-CM | POA: Diagnosis present

## 2020-08-29 DIAGNOSIS — F958 Other tic disorders: Secondary | ICD-10-CM

## 2020-08-29 DIAGNOSIS — R42 Dizziness and giddiness: Secondary | ICD-10-CM | POA: Insufficient documentation

## 2020-08-29 DIAGNOSIS — Z20822 Contact with and (suspected) exposure to covid-19: Secondary | ICD-10-CM | POA: Insufficient documentation

## 2020-08-29 DIAGNOSIS — R001 Bradycardia, unspecified: Secondary | ICD-10-CM | POA: Diagnosis not present

## 2020-08-29 LAB — URINALYSIS, ROUTINE W REFLEX MICROSCOPIC
Bilirubin Urine: NEGATIVE
Glucose, UA: NEGATIVE mg/dL
Ketones, ur: NEGATIVE mg/dL
Nitrite: NEGATIVE
Protein, ur: 30 mg/dL — AB
RBC / HPF: >50 RBC/hpf — ABNORMAL HIGH (ref 0–5)
Specific Gravity, Urine: 1.013 (ref 1.005–1.030)
WBC, UA: >50 WBC/hpf — ABNORMAL HIGH (ref 0–5)
pH: 6 (ref 5.0–8.0)

## 2020-08-29 LAB — RAPID URINE DRUG SCREEN, HOSP PERFORMED
Amphetamines: NOT DETECTED
Barbiturates: POSITIVE — AB
Benzodiazepines: NOT DETECTED
Cocaine: NOT DETECTED
Opiates: NOT DETECTED
Tetrahydrocannabinol: NOT DETECTED

## 2020-08-29 LAB — COMPREHENSIVE METABOLIC PANEL
ALT: 10 U/L (ref 0–44)
AST: 13 U/L — ABNORMAL LOW (ref 15–41)
Albumin: 3.7 g/dL (ref 3.5–5.0)
Alkaline Phosphatase: 58 U/L (ref 38–126)
Anion gap: 6 (ref 5–15)
BUN: 13 mg/dL (ref 6–20)
CO2: 25 mmol/L (ref 22–32)
Calcium: 9.1 mg/dL (ref 8.9–10.3)
Chloride: 103 mmol/L (ref 98–111)
Creatinine, Ser: 0.93 mg/dL (ref 0.44–1.00)
GFR, Estimated: >60 mL/min (ref >60)
Glucose, Bld: 109 mg/dL — ABNORMAL HIGH (ref 70–99)
Potassium: 4 mmol/L (ref 3.5–5.1)
Sodium: 134 mmol/L — ABNORMAL LOW (ref 135–145)
Total Bilirubin: 0.2 mg/dL — ABNORMAL LOW (ref 0.3–1.2)
Total Protein: 7.4 g/dL (ref 6.5–8.1)

## 2020-08-29 LAB — CBC WITH DIFFERENTIAL/PLATELET
Abs Immature Granulocytes: 0.03 10*3/uL (ref 0.00–0.07)
Basophils Absolute: 0 10*3/uL (ref 0.0–0.1)
Basophils Relative: 0 %
Eosinophils Absolute: 0 10*3/uL (ref 0.0–0.5)
Eosinophils Relative: 0 %
HCT: 29.3 % — ABNORMAL LOW (ref 36.0–46.0)
Hemoglobin: 9 g/dL — ABNORMAL LOW (ref 12.0–15.0)
Immature Granulocytes: 0 %
Lymphocytes Relative: 29 %
Lymphs Abs: 3.8 10*3/uL (ref 0.7–4.0)
MCH: 23.6 pg — ABNORMAL LOW (ref 26.0–34.0)
MCHC: 30.7 g/dL (ref 30.0–36.0)
MCV: 76.7 fL — ABNORMAL LOW (ref 80.0–100.0)
Monocytes Absolute: 0.8 10*3/uL (ref 0.1–1.0)
Monocytes Relative: 6 %
Neutro Abs: 8.7 10*3/uL — ABNORMAL HIGH (ref 1.7–7.7)
Neutrophils Relative %: 65 %
Platelets: 333 10*3/uL (ref 150–400)
RBC: 3.82 MIL/uL — ABNORMAL LOW (ref 3.87–5.11)
RDW: 18.6 % — ABNORMAL HIGH (ref 11.5–15.5)
WBC: 13.4 10*3/uL — ABNORMAL HIGH (ref 4.0–10.5)
nRBC: 0 % (ref 0.0–0.2)

## 2020-08-29 LAB — ACETAMINOPHEN LEVEL: Acetaminophen (Tylenol), Serum: <10 ug/mL — ABNORMAL LOW (ref 10–30)

## 2020-08-29 LAB — RESP PANEL BY RT-PCR (FLU A&B, COVID) ARPGX2
Influenza A by PCR: NEGATIVE
Influenza B by PCR: NEGATIVE
SARS Coronavirus 2 by RT PCR: NEGATIVE

## 2020-08-29 LAB — TROPONIN I (HIGH SENSITIVITY)
Troponin I (High Sensitivity): 7 ng/L (ref <18)
Troponin I (High Sensitivity): 6 ng/L (ref <18)

## 2020-08-29 LAB — CBG MONITORING, ED: Glucose-Capillary: 108 mg/dL — ABNORMAL HIGH (ref 70–99)

## 2020-08-29 LAB — I-STAT BETA HCG BLOOD, ED (MC, WL, AP ONLY): I-stat hCG, quantitative: <5 m[IU]/mL (ref <5)

## 2020-08-29 LAB — ETHANOL: Alcohol, Ethyl (B): <10 mg/dL (ref <10)

## 2020-08-29 LAB — SALICYLATE LEVEL: Salicylate Lvl: <7 mg/dL — ABNORMAL LOW (ref 7.0–30.0)

## 2020-08-29 MED ORDER — HYDRALAZINE HCL 25 MG PO TABS
25.0000 mg | ORAL_TABLET | Freq: Once | ORAL | Status: AC
  Administered 2020-08-29: 25 mg via ORAL
  Filled 2020-08-29: qty 1

## 2020-08-29 MED ORDER — LORAZEPAM 1 MG PO TABS
1.0000 mg | ORAL_TABLET | Freq: Once | ORAL | Status: AC
  Administered 2020-08-29: 1 mg via ORAL
  Filled 2020-08-29: qty 1

## 2020-08-29 MED ORDER — NITROFURANTOIN MONOHYD MACRO 100 MG PO CAPS
100.0000 mg | ORAL_CAPSULE | Freq: Once | ORAL | Status: AC
  Administered 2020-08-29: 100 mg via ORAL
  Filled 2020-08-29 (×3): qty 1

## 2020-08-29 MED ORDER — BENZTROPINE MESYLATE 1 MG/ML IJ SOLN
1.0000 mg | Freq: Once | INTRAMUSCULAR | Status: DC
  Filled 2020-08-29: qty 1

## 2020-08-29 MED ORDER — NICOTINE 21 MG/24HR TD PT24
21.0000 mg | MEDICATED_PATCH | Freq: Once | TRANSDERMAL | Status: AC
  Administered 2020-08-29: 21 mg via TRANSDERMAL
  Filled 2020-08-29: qty 1

## 2020-08-29 NOTE — ED Provider Notes (Signed)
[facility] Provider Note   CSN: [id number] Arrival date & time: 08/29/20  0444     History Chief Complaint  Patient presents with  . Dizziness    [name] is a 50 y.o. female.  The history is provided by the patient.  Dizziness Quality:  Lightheadedness Severity:  Moderate Onset quality:  Gradual Duration:  5 days Timing:  Constant Progression:  Unchanged Chronicity:  New Context: not when bending over, not with bowel movement, not with ear pain and not with eye movement   Relieved by:  Nothing Worsened by:  Nothing Ineffective treatments:  None tried Associated symptoms: no blood in stool, no chest pain, no diarrhea, no headaches, no hearing loss, no shortness of breath and no weakness   Risk factors: no anemia   Patient with bipolar disorder presents with fatigue and lightheadedness for 5 days.  Has been seen for URI by PMD and placed on zpak and steroids.  No f/c/r.  No CP, no SOB.  No n/v/d.  No focal weakness, no numbness.  No changes in vision or speech.  Patient also notes an ongoing tremor.       Past Medical History:  Diagnosis Date  . Arthritis   . Bipolar 1 disorder ([place])   . Bronchitis   . Depression   . Hemorrhoid   . Schizoaffective disorder ([place])   . Schizophrenia ([place])    Pt denies and reports it is schizoaffective disorder.   . Transfusion history     Patient Active Problem List   Diagnosis Date Noted  . Schizophrenia ([place]) 02/07/2019  . Schizoaffective disorder ([place]) 11/08/2018  . No diagnosis on Axis I 08/21/2015  . Abnormal behavior   . Involuntary commitment   . Psychosis ([place])   . Disturbance in affect   . Tobacco use disorder 05/12/2015  . Cannabis use disorder, moderate, dependence ([place]) 05/12/2015  . Schizo affective schizophrenia ([place]) 05/10/2015  . Hypertension 02/24/2011  . ANEMIA-IRON DEFICIENCY 12/13/2006  . GERD 12/13/2006  . OBESITY 12/12/2006  . Schizoaffective disorder,  bipolar type ([place]) 12/12/2006  . HEMORRHOIDS, NOS 12/12/2006  . PAIN-NECK 12/12/2006  . Abnormal involuntary movement 12/12/2006    Past Surgical History:  Procedure Laterality Date  . CESAREAN SECTION       OB History   No obstetric history on file.     Family History  Problem Relation Age of Onset  . Diabetes type II Other   . Hypertension Mother   . Hypertension Father     Social History   Tobacco Use  . Smoking status: Current Every Day Smoker    Packs/day: 0.50    Years: 17.00    Pack years: 8.50    Types: Cigarettes  . Smokeless tobacco: Never Used  Vaping Use  . Vaping Use: Never used  Substance Use Topics  . Alcohol use: No  . Drug use: Not Currently    Types: Marijuana    Comment: Former user    Home Medications Prior to Admission medications   Medication Sig Start Date End Date Taking? Authorizing Provider  SEROQUEL 100 MG tablet Take 100 mg by mouth at bedtime. 02/14/20   [provider]    Allergies    Anette Guarneri [lurasidone hcl], Abilify [aripiprazole], Flagyl [metronidazole hcl], Prolixin [fluphenazine], Risperidone and related, Seroquel [quetiapine fumarate], and Sulfa antibiotics  Review of Systems   Review of Systems  Constitutional: Negative for fever.  HENT: Negative for hearing loss.   Eyes: Negative  for visual disturbance.  Respiratory: Negative for shortness of breath.   Cardiovascular: Negative for chest pain.  Gastrointestinal: Negative for blood in stool and diarrhea.  Genitourinary: Negative for difficulty urinating.  Musculoskeletal: Negative for arthralgias.  Skin: Negative for wound.  Neurological: Positive for dizziness and light-headedness. Negative for seizures, facial asymmetry, speech difficulty, weakness, numbness and headaches.       Chronic   Psychiatric/Behavioral: Negative for agitation.  All other systems reviewed and are negative.   Physical Exam Updated Vital Signs BP (!) 123/95 (BP Location: Right  Arm)   Pulse (!) 53   Temp 98.5 F (36.9 C) (Oral)   Resp 18   Ht 5\' 4"  (1.626 m)   Wt 104 kg   SpO2 99%   BMI 39.36 kg/m   Physical Exam Vitals and nursing note reviewed.  Constitutional:      General: She is not in acute distress.    Appearance: Normal appearance.  HENT:     Head: Normocephalic and atraumatic.     Nose: Nose normal.     Mouth/Throat:     Mouth: Mucous membranes are moist.     Pharynx: Oropharynx is clear.  Eyes:     Extraocular Movements: Extraocular movements intact.     Conjunctiva/sclera: Conjunctivae normal.     Pupils: Pupils are equal, round, and reactive to light.  Cardiovascular:     Rate and Rhythm: Normal rate and regular rhythm.     Pulses: Normal pulses.     Heart sounds: Normal heart sounds.  Pulmonary:     Effort: Pulmonary effort is normal.     Breath sounds: Normal breath sounds.  Abdominal:     General: Abdomen is flat. Bowel sounds are normal.     Palpations: Abdomen is soft.  Musculoskeletal:        General: No tenderness. Normal range of motion.     Cervical back: Normal range of motion and neck supple.     Right lower leg: No edema.     Left lower leg: No edema.  Skin:    General: Skin is warm and dry.     Capillary Refill: Capillary refill takes less than 2 seconds.  Neurological:     General: No focal deficit present.     Mental Status: She is alert and oriented to person, place, and time.     Cranial Nerves: No cranial nerve deficit.     Motor: No weakness.     Deep Tendon Reflexes: Reflexes normal.  Psychiatric:        Thought Content: Thought content normal.     ED Results / Procedures / Treatments   Labs (all labs ordered are listed, but only abnormal results are displayed) Results for orders placed or performed during the hospital encounter of 08/29/20  CBC WITH DIFFERENTIAL  Result Value Ref Range   WBC 13.4 (H) 4.0 - 10.5 K/uL   RBC 3.82 (L) 3.87 - 5.11 MIL/uL   Hemoglobin 9.0 (L) 12.0 - 15.0 g/dL   HCT  29.3 (L) 36.0 - 46.0 %   MCV 76.7 (L) 80.0 - 100.0 fL   MCH 23.6 (L) 26.0 - 34.0 pg   MCHC 30.7 30.0 - 36.0 g/dL   RDW 18.6 (H) 11.5 - 15.5 %   Platelets 333 150 - 400 K/uL   nRBC 0.0 0.0 - 0.2 %   Neutrophils Relative % 65 %   Neutro Abs 8.7 (H) 1.7 - 7.7 K/uL   Lymphocytes Relative 29 %  Lymphs Abs 3.8 0.7 - 4.0 K/uL   Monocytes Relative 6 %   Monocytes Absolute 0.8 0.1 - 1.0 K/uL   Eosinophils Relative 0 %   Eosinophils Absolute 0.0 0.0 - 0.5 K/uL   Basophils Relative 0 %   Basophils Absolute 0.0 0.0 - 0.1 K/uL   Immature Granulocytes 0 %   Abs Immature Granulocytes 0.03 0.00 - 0.07 K/uL  I-Stat beta hCG blood, ED  Result Value Ref Range   I-stat hCG, quantitative <5.0 <5 mIU/mL   Comment 3           No results found.  EKG  EKG Interpretation  Date/Time:  Friday Aug 29 2020 05:43:57 EDT Ventricular Rate:  48 PR Interval:  158 QRS Duration: 84 QT Interval:  434 QTC Calculation: 387 R Axis:   50 Text Interpretation: Sinus bradycardia Low voltage QRS Confirmed by Randal Buba, Davidjames Blansett ([id number]) on 08/29/2020 5:56:44 AM       Radiology No results found.  Procedures Procedures    ED Course  I have reviewed the triage vital signs and the nursing notes.  Pertinent labs & imaging results that were available during my care of the patient were reviewed by me and considered in my medical decision making (see chart for details).    The tremor is volitional in nature and slowed when the patient was distracted and then patient was told she needed to stop this to obtain and EKG and she was able to stop the tremor completely.    Will need complete lab work up and if no signs of infection, may need TTS Final Clinical Impression(s) / ED Diagnoses Signed out to am to Dr. Maryan Rued pending labs and imaging    Kent Riendeau, MD 08/29/20 [phone]
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
## 2020-08-29 NOTE — ED Notes (Signed)
Patient ambulated unassisted to restroom with steady gait.

## 2020-08-29 NOTE — ED Triage Notes (Signed)
Pt [facility] from home with complaints of increased dizziness and weakness over night and a headache. Pt states the tremors are normal but have increased in the last 3 days.   EMS vitals:  150/90 54 HR 97% RA 18 RR

## 2020-08-29 NOTE — BH Assessment (Addendum)
Clinician called the TTS cart Ilene, NT answered and expressed the pt is currently in the hallway. NT to call Charge Nurse for pt to be brought to a private room.   Clinician to call the cart in a few minutes.   Vertell Novak, [place], [facility], [facility] Triage Specialist [phone]

## 2020-08-29 NOTE — ED Provider Notes (Signed)
Assumed care of patient at 7:30 AM from Dr. Nicholes Stairs.  Patient presenting today with complaint of nonspecific dizziness, generalized shaking had recently had a URI and been on a Z-Pak and steroids.  Patient had normal labs and imaging.  Urine does show possible UTI with greater than 50 red blood cells, white blood cells and small leukocytes.  Patient reports she is having some burning with urination.  She was able to ambulate here with a steady gait not requiring any assistance.  However when giving the patient all of this information she reports that she just cannot handle the voices anymore and they are wearing her down.  She has tried multiple medications and she states nothing helps with the voices.  She denies that they are telling her to do anything to herself or threatening but she has difficulty sleeping and states they are always there.  She would like to speak with someone from behavioral health.  This was ordered.  Patient's home medications were ordered.  She was given a dose of Macrobid for the urine.  Do not feel that her possible UTI is a cause of the hallucinations feel this is related to her underlying mental health disorder.   Blanchie Dessert, MD 08/29/20 1115

## 2020-08-29 NOTE — ED Notes (Signed)
Spoke to psych at this time who states pt is on the list for TTS consult. Pending psych consult at this time.

## 2020-08-29 NOTE — ED Notes (Signed)
Patient transported to CT 

## 2020-08-29 NOTE — ED Notes (Addendum)
Dr.Plunkett notified of pt bp. No symptoms reported. Pt is alert and oriented x 4. Pt starts shaking more when bp is taking. Denies headache, dizziness or any discomfort. Pt has no hx of high bp. No new orders. MD states to observe pt at this time. Pending psych consult. Attempted to contact psych multiple times with no answer. Will continue to monitor.

## 2020-08-29 NOTE — ED Notes (Signed)
Patient transported to X-ray 

## 2020-08-29 NOTE — ED Notes (Signed)
Pt tremors stop on command and Dr. Randal Buba notified

## 2020-08-29 NOTE — BH Assessment (Signed)
TTS spoke with NS,  (Delorise) to speak with RN Steva Ready), to put Pt in private room to complete TTS assessment.  Clinician to call the cart in 15 minutes.

## 2020-08-29 NOTE — ED Notes (Signed)
Pt eloped. Unable to find pt at this time. Searched multiple areas by staff. Pt is not IVC. Alert and oriented x 4.

## 2020-08-29 NOTE — ED Notes (Signed)
Attempted to wake pt twice to stand and walk to the bathroom but pt starts shaking and groaning when being talked to. After leaving the pt alone all shaking stops.

## 2020-08-29 NOTE — ED Notes (Signed)
Dr.Hong notified of pt bp. Pt has no symptoms at this time. Alert and oriented x 4.

## 2020-08-29 NOTE — ED Notes (Signed)
Set pt up for TTS in private room. Will alert provider when pt is done and recommendation is made.

## 2020-08-30 DIAGNOSIS — F200 Paranoid schizophrenia: Secondary | ICD-10-CM

## 2020-08-30 DIAGNOSIS — F259 Schizoaffective disorder, unspecified: Secondary | ICD-10-CM

## 2020-08-30 NOTE — [facility] Suicide Risk Assessment (Cosign Needed Addendum)
Suicide Risk Assessment  Discharge Assessment   [facility] Discharge Suicide Risk Assessment   Principal Problem: Schizophrenia [facility]) Discharge Diagnoses: Principal Problem:   Schizophrenia ([place])   Total Time spent with patient: 30 minutes  Subjective: [name], 50 y.o., female patient presented to [facility] initially seen for medical concerns and reported hallucinations.    Patient seen via telepsych by this provider; chart reviewed and consulted with Dr. Dwyane Dee on 08/31/20.  On evaluation [name] reports a history for schizophrenia, and is followed outpatient psychiatrist Dr. Alonza Smoker.  She reports taking haldol 5mg  po daily and seroquel 100mg  po qhs (chart reflects seroquel allergy), with some missed dosages but meds help her positive symptoms.  She has a tremor to her left upper extremity but states this has been present for many months and is managed by her psychiatrist, improves with clonazepam.  She states she slept well last night, feels better today and is "better and I want to go home."  States he has not been able to rest at her house but was able to sleep good at the hospital.  Her mood has improved, she denies suicidal or homicidal ideations; has chronic audible hallucinations but denies command hallucinations.  These are no new or worsening today.    Since admission, she has been medication compliant and cooperative with the staff.  No behavioral or safety concerns.     Musculoskeletal: Strength & Muscle Tone: within normal limits Gait & Station: normal Patient leans: Right  Psychiatric Specialty Exam  Presentation  General Appearance: Appropriate for Environment; Casual; Well Groomed  Eye Contact:Fair  Speech:Clear and Coherent; Normal Rate  Speech Volume:Normal  Handedness:Left   Mood and Affect  Mood:Euthymic  Duration of Depression Symptoms: Less than two weeks  Affect:Congruent; Flat   Thought Process  Thought Processes:Coherent;  Goal Directed  Descriptions of Associations:Intact  Orientation:Full (Time, Place and Person)  Thought Content:WDL  History of Schizophrenia/Schizoaffective disorder:Yes  Duration of Psychotic Symptoms:Less than six months  Hallucination: has chronic audible hallucinations, denies command Ideas of Reference: denies  Suicidal Thoughts:denies Homicidal Thoughts:denies  Sensorium  Memory:Immediate Fair; Recent Fair; Remote Fair  Judgment:Fair baseline Insight:Fair   Executive Functions  Concentration:Good Attention Span:Good [place]  Language: Good  Psychomotor Activity  Psychomotor Activity: increased d/t left sided tremor  Assets  Assets:Communication Skills; Desire for Improvement; Financial Resources/Insurance; Housing; Leisure Time; Physical Health; Resilience   Sleep  Sleep: >8 hours  Physical Exam: @PHYEXAMBYAGE @ @ROS @ Blood pressure (!) 151/93, pulse 79, temperature 99 F (37.2 C), temperature source Oral, resp. rate 16, height 5\' 4"  (1.626 m), weight 104 kg, SpO2 98 %. Body mass index is 39.36 kg/m.  Mental Status Per Nursing Assessment::   On Admission:     Demographic Factors:  Living alone  Loss Factors: NA  Historical Factors: Impulsivity  Risk Reduction Factors:   Positive social support and Positive therapeutic relationship  Continued Clinical Symptoms:  Schizophrenia:   Depressive state  Cognitive Features That Contribute To Risk:  None    Suicide Risk:  Mild:  Suicidal ideation of limited frequency, intensity, duration, and specificity.  There are no identifiable plans, no associated intent, mild dysphoria and related symptoms, good self-control (both objective and subjective assessment), few other risk factors, and identifiable protective factors, including available and accessible social support.   Follow-up Information    Care, [facility].   Contact information: [address] [place] [address] [phone]  Plan Of Care/Follow-up recommendations:  Plan- As per above assessment, there are no current grounds for involuntary commitment at this time.? The patient is future oriented and relates her plan to follow-up with her outpatient psychiatrist, Dr. [name]. She is psych cleared.  Patient is not currently interested in inpatient services, but expresses agreement to continue outpatient treatment - we have reviewed importance of substance abuse abstinence, potential negative impact substance abuse can have on his relationships and level of functioning, and importance of medication compliance. ?  Disposition: No evidence of imminent risk to self or others at present.   Patient does not meet criteria for psychiatric inpatient admission. Supportive therapy provided about ongoing stressors. Discussed crisis plan, support from social network, calling 911, coming to the Emergency Department, and calling Suicide Hotline.   Spoke with Dr. Theotis Burrow, EDP; Glennie [place], SW were informed of above recommendation and disposition.   This service was provided via telemedicine using a 2-way, interactive audio and video technology.?  Names of all persons participating in this telemedicine service and their role in this encounter.?  Name:  Role: Alpha Gula  Name: Merlyn Lot Role: PMHNP?  ?  ?  Mallie Darting, NP 08/31/2020, 9:27 PM

## 2020-08-30 NOTE — Discharge Instructions (Addendum)
Follow up as directed by counselor for treatment.  Return if any problems.

## 2020-08-30 NOTE — ED Notes (Signed)
TTS in progress 

## 2020-08-30 NOTE — ED Notes (Signed)
Patient was given a Cup of Ginger Ale.

## 2020-08-31 LAB — URINE CULTURE

## 2020-09-02 ENCOUNTER — Other Ambulatory Visit: Payer: Self-pay

## 2020-09-02 ENCOUNTER — Emergency Department (HOSPITAL_COMMUNITY)
Admission: EM | Admit: 2020-09-02 | Discharge: 2020-09-03 | Disposition: A | Discharge status: Home / Self Care | Payer: Medicaid Other | Attending: Emergency Medicine | Admitting: Emergency Medicine

## 2020-09-02 ENCOUNTER — Encounter (HOSPITAL_COMMUNITY): Payer: Self-pay

## 2020-09-02 DIAGNOSIS — G251 Drug-induced tremor: Secondary | ICD-10-CM | POA: Insufficient documentation

## 2020-09-02 DIAGNOSIS — F17210 Nicotine dependence, cigarettes, uncomplicated: Secondary | ICD-10-CM | POA: Diagnosis not present

## 2020-09-02 DIAGNOSIS — I10 Essential (primary) hypertension: Secondary | ICD-10-CM | POA: Diagnosis not present

## 2020-09-02 DIAGNOSIS — T434X5A Adverse effect of butyrophenone and thiothixene neuroleptics, initial encounter: Secondary | ICD-10-CM | POA: Insufficient documentation

## 2020-09-02 DIAGNOSIS — T887XXA Unspecified adverse effect of drug or medicament, initial encounter: Secondary | ICD-10-CM

## 2020-09-02 MED ORDER — LORAZEPAM 2 MG/ML IJ SOLN
1.0000 mg | Freq: Once | INTRAMUSCULAR | Status: AC
  Administered 2020-09-02: 1 mg via INTRAVENOUS
  Filled 2020-09-02: qty 1

## 2020-09-02 NOTE — ED Notes (Signed)
Patients respirations even, unlabored at this time. Patient has patent airway at the moment.

## 2020-09-02 NOTE — ED Triage Notes (Signed)
Pt BIB [facility], reports having a reaction to haldol. Reports tremors in neck and extremities starting this evening. Hx TD in the past and states she was started back on haldol approx 3 wks ago. Took 25mg  PO benadryl at home around 8p and given 50mg  benadryl IV with EMS with some improvement

## 2020-09-02 NOTE — ED Provider Notes (Signed)
[facility] DEPT Provider Note   CSN: [id number] Arrival date & time: 09/02/20  2219     History Chief Complaint  Patient presents with  . Medication Reaction    [name] is a 50 y.o. female.  The history is provided by the patient and medical records.   50 year old female with history of bipolar disorder, depression, schizoaffective disorder, arthritis, presenting to the ED with medication reaction.  States her schizophrenia is rather severe, Haldol is one of the few medications that can "bring her back to reality".  States she was on this for a while before but had tardive dyskinesia symptoms so this was discontinued.  Recently she has been struggling with her mental illness so doctor decided to put her back on this.  States from a psychiatric standpoint she feels better, but is again experiencing the side effects like she did before including dry mouth and tremors of her hands.  States this is almost identical to her prior reaction.  She denies any difficulty swallowing, chest pain, or shortness of breath.  She denies any other recent medication changes.  She did take Benadryl at home, 25 mg dose, and was given additional 50 mg IV with EMS with transient resolution of her tremors but now have started back again.  She denies SI/HI/AVH.  Past Medical History:  Diagnosis Date  . Arthritis   . Bipolar 1 disorder ([place])   . Bronchitis   . Depression   . Hemorrhoid   . Schizoaffective disorder ([place])   . Schizophrenia ([place])    Pt denies and reports it is schizoaffective disorder.   . Transfusion history     Patient Active Problem List   Diagnosis Date Noted  . Schizophrenia ([place]) 02/07/2019  . Schizoaffective disorder ([place]) 11/08/2018  . No diagnosis on Axis I 08/21/2015  . Abnormal behavior   . Involuntary commitment   . Psychosis ([place])   . Disturbance in affect   . Tobacco use disorder 05/12/2015  . Cannabis use disorder, moderate,  dependence ([place]) 05/12/2015  . Schizo affective schizophrenia ([place]) 05/10/2015  . Hypertension 02/24/2011  . ANEMIA-IRON DEFICIENCY 12/13/2006  . GERD 12/13/2006  . OBESITY 12/12/2006  . Schizoaffective disorder, bipolar type ([place]) 12/12/2006  . HEMORRHOIDS, NOS 12/12/2006  . PAIN-NECK 12/12/2006  . Abnormal involuntary movement 12/12/2006    Past Surgical History:  Procedure Laterality Date  . CESAREAN SECTION       OB History   No obstetric history on file.     Family History  Problem Relation Age of Onset  . Diabetes type II Other   . Hypertension Mother   . Hypertension Father     Social History   Tobacco Use  . Smoking status: Current Every Day Smoker    Packs/day: 0.50    Years: 17.00    Pack years: 8.50    Types: Cigarettes  . Smokeless tobacco: Never Used  Vaping Use  . Vaping Use: Never used  Substance Use Topics  . Alcohol use: No  . Drug use: Not Currently    Types: Marijuana    Comment: Former user    Home Medications Prior to Admission medications   Medication Sig Start Date End Date Taking? Authorizing Provider  AZITHROMYCIN PO Take by mouth.    [provider]  SEROQUEL 100 MG tablet Take 100 mg by mouth at bedtime. 02/14/20   [provider]    Allergies    Anette Guarneri [lurasidone hcl], Abilify [aripiprazole], Flagyl [metronidazole  hcl], Prolixin [fluphenazine], Risperidone and related, Seroquel [quetiapine fumarate], and Sulfa antibiotics  Review of Systems   Review of Systems  Neurological: Positive for tremors.  All other systems reviewed and are negative.   Physical Exam Updated Vital Signs BP (!) 145/87 (BP Location: Right Wrist)   Pulse 73   Temp 100 F (37.8 C) (Oral)   Resp 16   SpO2 100%   Physical Exam Vitals and nursing note reviewed.  Constitutional:      Appearance: She is well-developed.  HENT:     Head: Normocephalic and atraumatic.  Eyes:     Conjunctiva/sclera: Conjunctivae normal.      Pupils: Pupils are equal, round, and reactive to light.  Cardiovascular:     Rate and Rhythm: Normal rate and regular rhythm.     Heart sounds: Normal heart sounds.  Pulmonary:     Effort: Pulmonary effort is normal.     Breath sounds: Normal breath sounds.  Abdominal:     General: Bowel sounds are normal.     Palpations: Abdomen is soft.  Musculoskeletal:        General: Normal range of motion.     Cervical back: Normal range of motion.  Skin:    General: Skin is warm and dry.  Neurological:     Mental Status: She is alert and oriented to person, place, and time.     Comments: AAOx3, answering questions and following commands without issue,tremors noted of both hands, improves with intentional movements; no focal strength/sensory deficit  Psychiatric:     Comments: Good insight, denies SI/HI/AVH     ED Results / Procedures / Treatments   Labs (all labs ordered are listed, but only abnormal results are displayed) Labs Reviewed  CBC WITH DIFFERENTIAL/PLATELET - Abnormal; Notable for the following components:      Result Value   WBC 12.2 (*)    RBC 3.50 (*)    Hemoglobin 8.3 (*)    HCT 26.5 (*)    MCV 75.7 (*)    MCH 23.7 (*)    RDW 18.4 (*)    Lymphs Abs 4.6 (*)    All other components within normal limits  COMPREHENSIVE METABOLIC PANEL - Abnormal; Notable for the following components:   Potassium 3.2 (*)    Calcium 8.8 (*)    AST 11 (*)    All other components within normal limits  ETHANOL  RAPID URINE DRUG SCREEN, HOSP PERFORMED  I-STAT BETA HCG BLOOD, ED ([facility], [facility], [facility] ONLY)    EKG None  Radiology No results found.  Procedures Procedures   Medications Ordered in ED Medications  LORazepam (ATIVAN) injection 1 mg (1 mg Intravenous Given 09/02/20 2340)    ED Course  I have reviewed the triage vital signs and the nursing notes.  Pertinent labs & imaging results that were available during my care of the patient were reviewed by me and considered in my medical  decision making (see chart for details).    MDM Rules/Calculators/A&P  50 y.o. F here due to what appears to be side effects from her haldol.  Has history of TD related to haldol in the past and was recently re-started on this.  She is awake, alert, appropriately oriented.  She is able to answer questions and follow commands.  She does have tremors of the head and hands, this improves somewhat with intentional movement.  Did report some transient relief with IV Benadryl by EMS but symptoms have since recurred.  She denies any  illicit substance abuse.  No other medication changes.  She is not having any focal numbness or weakness.  She denies any suicidal homicidal ideation.  No hallucinations.  Will check screening labs, trial of benzos.  1:05 AM Patient reassessed-- she is no longer having any tremors, sleeping comfortably.  Attempted to wake her to discuss results, however she is fairly sedate from large amounts of benadryl and ativan.  Will monitor here for a bit until more awake/alert.  2:46 AM Able to arouse patient now, we spoke about symptoms and discussed results.  Labs overall reassuring.  She remains without tremors at present.  VSS.  At this point, feel she is stable for discharge.  Can continue benadryl PRN should tremors recur, advised not to exceed maximum daily dosing on box.  She will need to discuss with her psychiatrist about medication management, specifically her haldol. She may return here for any new/acute changes.  Final Clinical Impression(s) / ED Diagnoses Final diagnoses:  Medication side effect    Rx / DC Orders ED Discharge Orders    None       Larene Pickett, PA-C 46/50/35 [id number]    Delora Fuel, MD 81/27/51 [phone]

## 2020-09-03 LAB — COMPREHENSIVE METABOLIC PANEL
ALT: 7 U/L (ref 0–44)
AST: 11 U/L — ABNORMAL LOW (ref 15–41)
Albumin: 3.5 g/dL (ref 3.5–5.0)
Alkaline Phosphatase: 68 U/L (ref 38–126)
Anion gap: 5 (ref 5–15)
BUN: 13 mg/dL (ref 6–20)
CO2: 31 mmol/L (ref 22–32)
Calcium: 8.8 mg/dL — ABNORMAL LOW (ref 8.9–10.3)
Chloride: 100 mmol/L (ref 98–111)
Creatinine, Ser: 0.99 mg/dL (ref 0.44–1.00)
GFR, Estimated: >60 mL/min (ref >60)
Glucose, Bld: 98 mg/dL (ref 70–99)
Potassium: 3.2 mmol/L — ABNORMAL LOW (ref 3.5–5.1)
Sodium: 136 mmol/L (ref 135–145)
Total Bilirubin: 0.3 mg/dL (ref 0.3–1.2)
Total Protein: 7.1 g/dL (ref 6.5–8.1)

## 2020-09-03 LAB — ETHANOL: Alcohol, Ethyl (B): <10 mg/dL (ref <10)

## 2020-09-03 LAB — CBC WITH DIFFERENTIAL/PLATELET
Abs Immature Granulocytes: 0.04 10*3/uL (ref 0.00–0.07)
Basophils Absolute: 0.1 10*3/uL (ref 0.0–0.1)
Basophils Relative: 1 %
Eosinophils Absolute: 0.2 10*3/uL (ref 0.0–0.5)
Eosinophils Relative: 1 %
HCT: 26.5 % — ABNORMAL LOW (ref 36.0–46.0)
Hemoglobin: 8.3 g/dL — ABNORMAL LOW (ref 12.0–15.0)
Immature Granulocytes: 0 %
Lymphocytes Relative: 38 %
Lymphs Abs: 4.6 10*3/uL — ABNORMAL HIGH (ref 0.7–4.0)
MCH: 23.7 pg — ABNORMAL LOW (ref 26.0–34.0)
MCHC: 31.3 g/dL (ref 30.0–36.0)
MCV: 75.7 fL — ABNORMAL LOW (ref 80.0–100.0)
Monocytes Absolute: 0.7 10*3/uL (ref 0.1–1.0)
Monocytes Relative: 5 %
Neutro Abs: 6.7 10*3/uL (ref 1.7–7.7)
Neutrophils Relative %: 55 %
Platelets: 294 10*3/uL (ref 150–400)
RBC: 3.5 MIL/uL — ABNORMAL LOW (ref 3.87–5.11)
RDW: 18.4 % — ABNORMAL HIGH (ref 11.5–15.5)
WBC: 12.2 10*3/uL — ABNORMAL HIGH (ref 4.0–10.5)
nRBC: 0 % (ref 0.0–0.2)

## 2020-09-03 LAB — I-STAT BETA HCG BLOOD, ED (MC, WL, AP ONLY): I-stat hCG, quantitative: <5 m[IU]/mL (ref <5)

## 2020-09-03 LAB — RAPID URINE DRUG SCREEN, HOSP PERFORMED
Amphetamines: NOT DETECTED
Barbiturates: NOT DETECTED
Benzodiazepines: NOT DETECTED
Cocaine: NOT DETECTED
Opiates: NOT DETECTED
Tetrahydrocannabinol: NOT DETECTED

## 2020-09-03 NOTE — ED Notes (Signed)
Patient was discharged with no concerns

## 2020-09-03 NOTE — Discharge Instructions (Signed)
Your shaking today was due to haldol.  Can continue benadryl as needed if shaking/tremors recur.  Do not exceed maximum dosing on box. You need to speak with your psychiatrist about your medication and what can replace your haldol. Return here for new concerns.

## 2020-09-05 ENCOUNTER — Emergency Department (HOSPITAL_COMMUNITY)
Admission: EM | Admit: 2020-09-05 | Discharge: 2020-09-06 | Disposition: A | Discharge status: Other Acute Inpatient Hospital | Payer: Medicaid Other | Attending: Emergency Medicine | Admitting: Emergency Medicine

## 2020-09-05 ENCOUNTER — Encounter (HOSPITAL_COMMUNITY): Payer: Self-pay | Admitting: Emergency Medicine

## 2020-09-05 ENCOUNTER — Other Ambulatory Visit: Payer: Self-pay

## 2020-09-05 DIAGNOSIS — R682 Dry mouth, unspecified: Secondary | ICD-10-CM | POA: Diagnosis not present

## 2020-09-05 DIAGNOSIS — Z20822 Contact with and (suspected) exposure to covid-19: Secondary | ICD-10-CM | POA: Insufficient documentation

## 2020-09-05 DIAGNOSIS — Z79899 Other long term (current) drug therapy: Secondary | ICD-10-CM | POA: Diagnosis not present

## 2020-09-05 DIAGNOSIS — I10 Essential (primary) hypertension: Secondary | ICD-10-CM | POA: Diagnosis not present

## 2020-09-05 DIAGNOSIS — Z8616 Personal history of COVID-19: Secondary | ICD-10-CM | POA: Insufficient documentation

## 2020-09-05 DIAGNOSIS — F200 Paranoid schizophrenia: Secondary | ICD-10-CM | POA: Insufficient documentation

## 2020-09-05 DIAGNOSIS — R42 Dizziness and giddiness: Secondary | ICD-10-CM | POA: Insufficient documentation

## 2020-09-05 DIAGNOSIS — F259 Schizoaffective disorder, unspecified: Secondary | ICD-10-CM | POA: Insufficient documentation

## 2020-09-05 DIAGNOSIS — R443 Hallucinations, unspecified: Secondary | ICD-10-CM | POA: Diagnosis not present

## 2020-09-05 DIAGNOSIS — R251 Tremor, unspecified: Secondary | ICD-10-CM | POA: Insufficient documentation

## 2020-09-05 DIAGNOSIS — F17210 Nicotine dependence, cigarettes, uncomplicated: Secondary | ICD-10-CM | POA: Diagnosis not present

## 2020-09-05 NOTE — ED Triage Notes (Signed)
Patient here via EMS from home reporting dry mouth and tremors that started "some time ago" increasing getting worse. Started new med Caplyta 1.5 weeks ago.

## 2020-09-06 ENCOUNTER — Ambulatory Visit (HOSPITAL_COMMUNITY)
Admission: EM | Admit: 2020-09-06 | Discharge: 2020-09-06 | Disposition: A | Discharge status: Home / Self Care | Payer: Medicaid Other | Source: Home / Self Care

## 2020-09-06 DIAGNOSIS — F200 Paranoid schizophrenia: Secondary | ICD-10-CM | POA: Insufficient documentation

## 2020-09-06 DIAGNOSIS — Z79899 Other long term (current) drug therapy: Secondary | ICD-10-CM | POA: Insufficient documentation

## 2020-09-06 LAB — RESP PANEL BY RT-PCR (FLU A&B, COVID) ARPGX2
Influenza A by PCR: NEGATIVE
Influenza B by PCR: NEGATIVE
SARS Coronavirus 2 by RT PCR: NEGATIVE

## 2020-09-06 LAB — RAPID URINE DRUG SCREEN, HOSP PERFORMED
Amphetamines: NOT DETECTED
Barbiturates: NOT DETECTED
Benzodiazepines: NOT DETECTED
Cocaine: NOT DETECTED
Opiates: NOT DETECTED
Tetrahydrocannabinol: NOT DETECTED

## 2020-09-06 LAB — COMPREHENSIVE METABOLIC PANEL
ALT: 11 U/L (ref 0–44)
AST: 14 U/L — ABNORMAL LOW (ref 15–41)
Albumin: 3.5 g/dL (ref 3.5–5.0)
Alkaline Phosphatase: 67 U/L (ref 38–126)
Anion gap: 7 (ref 5–15)
BUN: 10 mg/dL (ref 6–20)
CO2: 26 mmol/L (ref 22–32)
Calcium: 9.1 mg/dL (ref 8.9–10.3)
Chloride: 103 mmol/L (ref 98–111)
Creatinine, Ser: 0.75 mg/dL (ref 0.44–1.00)
GFR, Estimated: >60 mL/min (ref >60)
Glucose, Bld: 94 mg/dL (ref 70–99)
Potassium: 3.3 mmol/L — ABNORMAL LOW (ref 3.5–5.1)
Sodium: 136 mmol/L (ref 135–145)
Total Bilirubin: 0.4 mg/dL (ref 0.3–1.2)
Total Protein: 7.1 g/dL (ref 6.5–8.1)

## 2020-09-06 LAB — CBC WITH DIFFERENTIAL/PLATELET
Abs Immature Granulocytes: 0.02 10*3/uL (ref 0.00–0.07)
Basophils Absolute: 0.1 10*3/uL (ref 0.0–0.1)
Basophils Relative: 1 %
Eosinophils Absolute: 0.2 10*3/uL (ref 0.0–0.5)
Eosinophils Relative: 2 %
HCT: 27.4 % — ABNORMAL LOW (ref 36.0–46.0)
Hemoglobin: 8.3 g/dL — ABNORMAL LOW (ref 12.0–15.0)
Immature Granulocytes: 0 %
Lymphocytes Relative: 31 %
Lymphs Abs: 3 10*3/uL (ref 0.7–4.0)
MCH: 23.2 pg — ABNORMAL LOW (ref 26.0–34.0)
MCHC: 30.3 g/dL (ref 30.0–36.0)
MCV: 76.5 fL — ABNORMAL LOW (ref 80.0–100.0)
Monocytes Absolute: 0.6 10*3/uL (ref 0.1–1.0)
Monocytes Relative: 6 %
Neutro Abs: 5.9 10*3/uL (ref 1.7–7.7)
Neutrophils Relative %: 60 %
Platelets: 362 10*3/uL (ref 150–400)
RBC: 3.58 MIL/uL — ABNORMAL LOW (ref 3.87–5.11)
RDW: 18.6 % — ABNORMAL HIGH (ref 11.5–15.5)
WBC: 9.7 10*3/uL (ref 4.0–10.5)
nRBC: 0 % (ref 0.0–0.2)

## 2020-09-06 LAB — SALICYLATE LEVEL: Salicylate Lvl: <7 mg/dL — ABNORMAL LOW (ref 7.0–30.0)

## 2020-09-06 LAB — ACETAMINOPHEN LEVEL: Acetaminophen (Tylenol), Serum: <10 ug/mL — ABNORMAL LOW (ref 10–30)

## 2020-09-06 LAB — I-STAT BETA HCG BLOOD, ED (MC, WL, AP ONLY): I-stat hCG, quantitative: <5 m[IU]/mL (ref <5)

## 2020-09-06 LAB — ETHANOL: Alcohol, Ethyl (B): <10 mg/dL (ref <10)

## 2020-09-06 LAB — CBG MONITORING, ED: Glucose-Capillary: 96 mg/dL (ref 70–99)

## 2020-09-06 MED ORDER — POTASSIUM CHLORIDE CRYS ER 20 MEQ PO TBCR
40.0000 meq | EXTENDED_RELEASE_TABLET | Freq: Once | ORAL | Status: AC
  Administered 2020-09-06: 40 meq via ORAL
  Filled 2020-09-06: qty 2

## 2020-09-06 NOTE — ED Notes (Signed)
[facility] called for transportation to [facility]

## 2020-09-06 NOTE — ED Provider Notes (Addendum)
Behavioral Health Urgent Care Medical Screening Exam  Patient Name: [name] MRN: [id number] Date of Evaluation: 09/06/20 Chief Complaint:   Diagnosis:  Final diagnoses:  Paranoid schizophrenia ([place])    History of Present illness: [name] is a 50 y.o. female.  Patient presents voluntarily to [facility] behavioral health from [facility] emergency department.  She states "I came to [facility] long because I was having shaking, dizziness, weakness and lightheadedness for the past few weeks."  She was medically cleared in the emergency department.  [name] reports she is followed by [facility] act team states "I think my medication is causing me to feel weak and dizzy."  She reports she has discussed this with outpatient psychiatrist and plans to do so again next week.  She has been diagnosed with schizoaffective disorder.  Reports she is compliant with current medications including Caplyta and long-acting injectable, Aristotle.  She has recently discontinued Haldol, stopped this medication 2 days ago.  She reports she is seen by her act team weekly, last met with act team on Thursday.  She resides in [place].  She denies access to weapons.  She is not employed.  She denies alcohol and substance use.  She endorses average sleep and appetite.  She is assessed by nurse practitioner.  She is alert and oriented, answers appropriately.  She is pleasant and cooperative during assessment.  She reports she is followed by primary care at [facility] and plans to follow-up with a neurologist moving forward.  She denies suicidal and homicidal ideations.  She denies auditory and visual hallucinations.  There is no evidence of delusional thought content and no indication that patient is responding to internal stimuli.  She denies symptoms of paranoia.  Patient offered support and encouragement.  She gives verbal consent to speak with her act team.  Spoke with Joellen Jersey, therapist with  str[facility] act team.  Joellen Jersey reported ACT team will follow up with [name] early next week.  Joellen Jersey denies concern for patient safety.  [name] reports patient should be transported home via city bus.  Per nursing staff patient refused vital signs.  Psychiatric Specialty Exam  Presentation  General Appearance:Appropriate for Environment; Casual  Eye Contact:Good  Speech:Clear and Coherent; Normal Rate  Speech Volume:Normal  Handedness:Right   Mood and Affect  Mood:Euthymic  Affect:Appropriate; Congruent   Thought Process  Thought Processes:Coherent; Goal Directed  Descriptions of Associations:Intact  Orientation:Full (Time, Place and Person)  Thought Content:Logical  Diagnosis of Schizophrenia or Schizoaffective disorder in past: Yes  Duration of Psychotic Symptoms: Greater than six months  Hallucinations:None The voiced is telling her to do things and she see's small animals in between the cracks of the door. voices tell her that they are going to break her windows, shoot her She sees small animals in the line on the door  Ideas of Reference:None  Suicidal Thoughts:No Without Intent; Without Plan  Homicidal Thoughts:No Without Intent; Without Plan   Sensorium  Memory:Immediate Good; Recent Good; Remote Good  Judgment:Good  Insight:Fair   Executive Functions  Concentration:Good  Attention Span:Good  [place] of Knowledge:Good  Language:Good   Psychomotor Activity  Psychomotor Activity:Tremor Tardive Dyskinesia No   Assets  Assets:Communication Skills; Desire for Improvement; Financial Resources/Insurance; Housing; Intimacy; Leisure Time; Physical Health; Resilience; Social Support; Talents/Skills   Sleep  Sleep:Good  Number of hours: 8   No data recorded  Physical Exam: Physical Exam Vitals and nursing note reviewed.  Constitutional:      Appearance: Normal appearance. She  is well-developed.  HENT:     Head: Normocephalic.      Nose: Nose normal.  Cardiovascular:     Rate and Rhythm: Normal rate.  Pulmonary:     Effort: Pulmonary effort is normal.  Musculoskeletal:        General: Normal range of motion.     Cervical back: Normal range of motion.  Neurological:     Mental Status: She is alert and oriented to person, place, and time.  Psychiatric:        Attention and Perception: Attention and perception normal.        Mood and Affect: Mood and affect normal.        Speech: Speech normal.        Behavior: Behavior normal. Behavior is cooperative.        Thought Content: Thought content normal.        Cognition and Memory: Cognition and memory normal.        Judgment: Judgment normal.    Review of Systems  Constitutional: Negative.   HENT: Negative.   Eyes: Negative.   Respiratory: Negative.   Cardiovascular: Negative.   Gastrointestinal: Negative.   Genitourinary: Negative.   Musculoskeletal: Negative.   Skin: Negative.   Neurological: Positive for tremors.  Endo/Heme/Allergies: Negative.   Psychiatric/Behavioral: Negative.    There were no vitals taken for this visit. There is no height or weight on file to calculate BMI.  Musculoskeletal: Strength & Muscle Tone: within normal limits Gait & Station: normal Patient leans: N/A   [place] MSE Discharge Disposition for Follow up and Recommendations: Based on my evaluation the patient does not appear to have an emergency medical condition and can be discharged with resources and follow up care in outpatient services for Medication Management and Individual Therapy  Patient reviewed with Dr. Dwyane Dee. Follow-up with established outpatient psychiatry, [facility] act team.   Lucky Rathke, FNP 09/06/2020, 11:44 AM

## 2020-09-06 NOTE — ED Notes (Signed)
Pt to be transfereed to [facility] after 730. Number for RN report is [phone]).
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
## 2020-09-06 NOTE — ED Notes (Signed)
Breakfast tray was given. Nurse aware.  

## 2020-09-06 NOTE — Discharge Instructions (Addendum)

## 2020-09-06 NOTE — ED Notes (Signed)
Patient dressed out in purple scrubs

## 2020-09-06 NOTE — ED Provider Notes (Signed)
[place] DEPT Provider Note   CSN: [id number] Arrival date & time: 09/05/20  2321     History Chief Complaint  Patient presents with  . Tremors  . Dry Mouth    [name] is a 50 y.o. female.  Patient presents to the emergency department with a chief complaint of dry mouth and tremors.  She states that since starting [place] for hallucinations, she has had tremors, dizziness, and dry mouth.  She states that she has not wanted to discontinue the medication because it is helping with her hallucinations, but she does not like how it makes her feel.  She denies any drugs or alcohol use.  Denies being in any acute pain, other than her chronic arthritis.  She denies any recent illnesses.  Denies SI or HI.  The history is provided by the patient. No language interpreter was used.       Past Medical History:  Diagnosis Date  . Arthritis   . Bipolar 1 disorder ([place])   . Bronchitis   . Depression   . Hemorrhoid   . Schizoaffective disorder ([place])   . Schizophrenia ([place])    Pt denies and reports it is schizoaffective disorder.   . Transfusion history     Patient Active Problem List   Diagnosis Date Noted  . Schizophrenia ([place]) 02/07/2019  . Schizoaffective disorder ([place]) 11/08/2018  . No diagnosis on Axis I 08/21/2015  . Abnormal behavior   . Involuntary commitment   . Psychosis ([place])   . Disturbance in affect   . Tobacco use disorder 05/12/2015  . Cannabis use disorder, moderate, dependence ([place]) 05/12/2015  . Schizo affective schizophrenia ([place]) 05/10/2015  . Hypertension 02/24/2011  . ANEMIA-IRON DEFICIENCY 12/13/2006  . GERD 12/13/2006  . OBESITY 12/12/2006  . Schizoaffective disorder, bipolar type ([place]) 12/12/2006  . HEMORRHOIDS, NOS 12/12/2006  . PAIN-NECK 12/12/2006  . Abnormal involuntary movement 12/12/2006    Past Surgical History:  Procedure Laterality Date  . CESAREAN SECTION       OB History   No obstetric  history on file.     Family History  Problem Relation Age of Onset  . Diabetes type II Other   . Hypertension Mother   . Hypertension Father     Social History   Tobacco Use  . Smoking status: Current Every Day Smoker    Packs/day: 0.50    Years: 17.00    Pack years: 8.50    Types: Cigarettes  . Smokeless tobacco: Never Used  Vaping Use  . Vaping Use: Never used  Substance Use Topics  . Alcohol use: No  . Drug use: Not Currently    Types: Marijuana    Comment: Former user    Home Medications Prior to Admission medications   Medication Sig Start Date End Date Taking? Authorizing Provider  CAPLYTA 42 MG CAPS Take 1 capsule by mouth daily. 08/19/20  Yes [provider]    Allergies    Anette Guarneri [lurasidone hcl], Abilify [aripiprazole], Flagyl [metronidazole hcl], Prolixin [fluphenazine], Risperidone and related, Seroquel [quetiapine fumarate], and Sulfa antibiotics  Review of Systems   Review of Systems  All other systems reviewed and are negative.   Physical Exam Updated Vital Signs BP (!) 199/99 (BP Location: Right Arm)   Pulse 77   Temp 99.3 F (37.4 C) (Oral)   Resp 18   Ht 5\' 4"  (1.626 m)   Wt 104.3 kg   SpO2 96%   BMI 39.48 kg/m  Physical Exam Vitals and nursing note reviewed.  Constitutional:      General: She is not in acute distress.    Appearance: She is well-developed.  HENT:     Head: Normocephalic and atraumatic.  Eyes:     Conjunctiva/sclera: Conjunctivae normal.  Cardiovascular:     Rate and Rhythm: Normal rate and regular rhythm.     Heart sounds: No murmur heard.   Pulmonary:     Effort: Pulmonary effort is normal. No respiratory distress.     Breath sounds: Normal breath sounds.  Abdominal:     Palpations: Abdomen is soft.     Tenderness: There is no abdominal tenderness.  Musculoskeletal:     Cervical back: Neck supple.  Skin:    General: Skin is warm and dry.  Neurological:     Mental Status: She is alert.      Comments: Tremors of head and upper extremities, however the cease when the patient is focusing on a task     ED Results / Procedures / Treatments   Labs (all labs ordered are listed, but only abnormal results are displayed) Labs Reviewed  COMPREHENSIVE METABOLIC PANEL  SALICYLATE LEVEL  ACETAMINOPHEN LEVEL  ETHANOL  RAPID URINE DRUG SCREEN, HOSP PERFORMED  CBC WITH DIFFERENTIAL/PLATELET  CBG MONITORING, ED  I-STAT BETA HCG BLOOD, ED ([facility], [facility], [facility] ONLY)    EKG None  Radiology No results found.  Procedures Procedures   Medications Ordered in ED Medications - No data to display  ED Course  I have reviewed the triage vital signs and the nursing notes.  Pertinent labs & imaging results that were available during my care of the patient were reviewed by me and considered in my medical decision making (see chart for details).    MDM Rules/Calculators/A&P                          Patient here with tremors and dry mouth.  She attributes the symptoms to her Caplyta.  She states that the medicine is helping with her hallucinations, but she does not like out makes her feel.  She would like to know if there is anything else that she can take.  Regarding her tremors, these resolve anytime I asked her to do something.  She is not hyperthermic, not confused, not tachycardic, I doubt neuroleptic malignant syndrome.  Will check labs and consult TTS for psychiatry evaluation to see if there is any medications that can be changed.   TTS recommendation is for patient to be transferred to [facility] at 0730 for monitoring. Final Clinical Impression(s) / ED Diagnoses Final diagnoses:  Hallucinations    Rx / DC Orders ED Discharge Orders    None       Montine Circle, PA-C 09/06/20 0533    Ripley Fraise, MD 09/06/20 [phone]

## 2020-09-16 ENCOUNTER — Other Ambulatory Visit: Payer: Self-pay | Admitting: Internal Medicine

## 2020-09-16 DIAGNOSIS — Z1231 Encounter for screening mammogram for malignant neoplasm of breast: Secondary | ICD-10-CM

## 2020-09-18 ENCOUNTER — Other Ambulatory Visit: Payer: Self-pay | Admitting: Internal Medicine

## 2020-09-18 DIAGNOSIS — F251 Schizoaffective disorder, depressive type: Secondary | ICD-10-CM

## 2020-09-19 ENCOUNTER — Other Ambulatory Visit: Payer: Self-pay | Admitting: Internal Medicine

## 2020-09-19 DIAGNOSIS — Z13820 Encounter for screening for osteoporosis: Secondary | ICD-10-CM

## 2020-09-19 DIAGNOSIS — F251 Schizoaffective disorder, depressive type: Secondary | ICD-10-CM

## 2020-10-02 ENCOUNTER — Other Ambulatory Visit: Payer: Medicaid - Dental

## 2020-10-07 ENCOUNTER — Other Ambulatory Visit: Payer: Self-pay

## 2020-10-07 ENCOUNTER — Ambulatory Visit
Admission: RE | Admit: 2020-10-07 | Discharge: 2020-10-07 | Disposition: A | Discharge status: Home / Self Care | Payer: Medicaid Other | Source: Ambulatory Visit | Attending: Internal Medicine | Admitting: Internal Medicine

## 2020-10-07 DIAGNOSIS — Z13820 Encounter for screening for osteoporosis: Secondary | ICD-10-CM

## 2020-10-07 DIAGNOSIS — F251 Schizoaffective disorder, depressive type: Secondary | ICD-10-CM

## 2020-11-07 ENCOUNTER — Ambulatory Visit (AMBULATORY_SURGERY_CENTER): Payer: Medicaid Other | Admitting: *Deleted

## 2020-11-07 ENCOUNTER — Other Ambulatory Visit: Payer: Self-pay

## 2020-11-07 VITALS — Ht 65.0 in | Wt 260.0 lb

## 2020-11-07 DIAGNOSIS — Z1211 Encounter for screening for malignant neoplasm of colon: Secondary | ICD-10-CM

## 2020-11-07 MED ORDER — NA SULFATE-K SULFATE-MG SULF 17.5-3.13-1.6 GM/177ML PO SOLN: ORAL | 0 refills | Status: DC

## 2020-11-07 NOTE — Progress Notes (Signed)
Patient's pre-visit was done today over the phone with the patient due to COVID-19 pandemic. Name,DOB and address verified. Insurance verified. Patient denies any allergies to Eggs and Soy. Patient denies any problems with anesthesia/sedation. Patient denies taking diet pills or blood thinners. No home Oxygen. Packet of Prep instructions mailed to patient including a copy of a consent form-pt is aware. Patient understands to call us back with any questions or concerns. Patient is aware of our care-partner policy and 0000000 safety protocol.   The patient is COVID-19 vaccinated, per patient. Patient belevies she had a colonoscopy 5-6 years ago in [place], she does not remember the MD name. I check with [facility] and Dr.Mann's office they do have any records for her. She will ask her PCP and let me know if they have any records. Patient aware to proceed with colon as scheduled.

## 2020-11-11 ENCOUNTER — Ambulatory Visit
Admission: RE | Admit: 2020-11-11 | Discharge: 2020-11-11 | Disposition: A | Discharge status: Home / Self Care | Payer: Medicaid Other | Source: Ambulatory Visit | Attending: Internal Medicine | Admitting: Internal Medicine

## 2020-11-11 ENCOUNTER — Ambulatory Visit: Payer: Medicaid - Dental

## 2020-11-11 ENCOUNTER — Other Ambulatory Visit: Payer: Self-pay

## 2020-11-11 DIAGNOSIS — Z1231 Encounter for screening mammogram for malignant neoplasm of breast: Secondary | ICD-10-CM

## 2020-11-18 ENCOUNTER — Telehealth: Payer: Self-pay | Admitting: Gastroenterology

## 2020-11-18 DIAGNOSIS — Z1211 Encounter for screening for malignant neoplasm of colon: Secondary | ICD-10-CM

## 2020-11-18 MED ORDER — NA SULFATE-K SULFATE-MG SULF 17.5-3.13-1.6 GM/177ML PO SOLN: 1.0000 | ORAL | 0 refills | Status: DC

## 2020-11-18 NOTE — Telephone Encounter (Signed)
Pt states that [facility] did not receive prescription for Suprep. Her procedure is on 11/21/20.

## 2020-11-18 NOTE — Telephone Encounter (Signed)
Resent Suprep RX to [facility].

## 2020-11-21 ENCOUNTER — Ambulatory Visit (AMBULATORY_SURGERY_CENTER): Payer: Medicaid Other | Admitting: Gastroenterology

## 2020-11-21 ENCOUNTER — Encounter: Payer: Self-pay | Admitting: Gastroenterology

## 2020-11-21 ENCOUNTER — Other Ambulatory Visit: Payer: Self-pay

## 2020-11-21 VITALS — BP 123/43 | HR 64 | Temp 98.7°F | Resp 12 | Ht 65.0 in | Wt 260.0 lb

## 2020-11-21 DIAGNOSIS — Z1211 Encounter for screening for malignant neoplasm of colon: Secondary | ICD-10-CM | POA: Diagnosis not present

## 2020-11-21 DIAGNOSIS — D124 Benign neoplasm of descending colon: Secondary | ICD-10-CM

## 2020-11-21 DIAGNOSIS — K621 Rectal polyp: Secondary | ICD-10-CM | POA: Diagnosis not present

## 2020-11-21 DIAGNOSIS — K635 Polyp of colon: Secondary | ICD-10-CM

## 2020-11-21 DIAGNOSIS — K634 Enteroptosis: Secondary | ICD-10-CM

## 2020-11-21 DIAGNOSIS — D128 Benign neoplasm of rectum: Secondary | ICD-10-CM

## 2020-11-21 MED ORDER — SODIUM CHLORIDE 0.9 % IV SOLN: 500.0000 mL | Freq: Once | INTRAVENOUS | Status: DC

## 2020-11-21 NOTE — Progress Notes (Signed)
NAD VSS Care transfrred to recovery RN

## 2020-11-21 NOTE — Op Note (Addendum)
[place] Patient Name: [name] Procedure Date: 11/21/2020 1:38 PM MRN: [id number] Endoscopist: Justice Britain , MD Age: 50 Referring MD:  Date of Birth: 1971/03/12 Gender: Female Account #: [id number] Procedure:                Colonoscopy Indications:              Screening for colorectal malignant neoplasm Medicines:                Monitored Anesthesia Care Procedure:                Pre-Anesthesia Assessment:                           - Prior to the procedure, a History and Physical                            was performed, and patient medications and                            allergies were reviewed. The patient's tolerance of                            previous anesthesia was also reviewed. The risks                            and benefits of the procedure and the sedation                            options and risks were discussed with the patient.                            All questions were answered, and informed consent                            was obtained. Prior Anticoagulants: The patient has                            taken no previous anticoagulant or antiplatelet                            agents. ASA Grade Assessment: II - A patient with                            mild systemic disease. After reviewing the risks                            and benefits, the patient was deemed in                            satisfactory condition to undergo the procedure.                           After obtaining informed consent, the colonoscope  was passed under direct vision. Throughout the                            procedure, the patient's blood pressure, pulse, and                            oxygen saturations were monitored continuously. The                            Olympus CF-HQ190L Colonscope was introduced through                            the anus and advanced to the the ileocolonic                            anastomosis. The  colonoscopy was performed without                            difficulty. The patient tolerated the procedure.                            The quality of the bowel preparation was adequate.                            The terminal ileum, ileocecal valve, appendiceal                            orifice, and rectum were photographed. Scope In: 1:45:05 PM Scope Out: 1:56:23 PM Scope Withdrawal Time: 0 hours 9 minutes 35 seconds  Total Procedure Duration: 0 hours 11 minutes 18 seconds  Findings:                 The digital rectal exam findings include                            hemorrhoids. Pertinent negatives include no                            palpable rectal lesions.                           The terminal ileum and ileocecal valve appeared                            normal.                           Four sessile polyps were found in the rectum (3)                            and descending colon (1). The polyps were 2 to 4 mm                            in size. These polyps were removed with a cold  snare. Resection and retrieval were complete.                           A localized area of mildly erythematous mucosa was                            found in the distal rectum. This was biopsied with                            a cold forceps for histology.                           Normal mucosa was found in the entire colon                            otherwise.                           Non-bleeding non-thrombosed external and internal                            hemorrhoids were found during retroflexion, during                            perianal exam and during digital exam. The                            hemorrhoids were Grade II (internal hemorrhoids                            that prolapse but reduce spontaneously). Complications:            No immediate complications. Estimated Blood Loss:     Estimated blood loss was minimal. Impression:               -  Hemorrhoids found on digital rectal exam.                           - The examined portion of the ileum was normal.                           - Four 2 to 4 mm polyps in the rectum and in the                            descending colon, removed with a cold snare.                            Resected and retrieved.                           - Erythematous mucosa in the distal rectum.                            Biopsied.                           -  Normal mucosa in the entire examined colon                            otherwise.                           - Non-bleeding non-thrombosed external and internal                            hemorrhoids. Recommendation:           - The patient will be observed post-procedure,                            until all discharge criteria are met.                           - Discharge patient to home.                           - Patient has a contact number available for                            emergencies. The signs and symptoms of potential                            delayed complications were discussed with the                            patient. Return to normal activities tomorrow.                            Written discharge instructions were provided to the                            patient.                           - High fiber diet.                           - Use FiberCon 1-2 tablets PO daily.                           - Continue present medications.                           - Await pathology results.                           - Repeat colonoscopy in 3/08/23/08 years for                            surveillance based on pathology results and                            findings of adenomatous tissue.                           -  The findings and recommendations were discussed                            with the patient.                           - The findings and recommendations were discussed                            with the patient's  family. Justice Britain, MD 11/21/2020 2:04:59 PM

## 2020-11-21 NOTE — Progress Notes (Signed)
Pt's states no medical or surgical changes since previsit or office visit.  VS CW  

## 2020-11-21 NOTE — Progress Notes (Signed)
Called to room to assist during endoscopic procedure.  Patient ID and intended procedure confirmed with present staff. Received instructions for my participation in the procedure from the performing physician.  

## 2020-11-21 NOTE — Patient Instructions (Signed)
Handout on polyp and high fiber given.  Use Fiber- con tablets daily.    YOU HAD AN ENDOSCOPIC PROCEDURE TODAY AT [facility]:   Refer to the procedure report that was given to you for any specific questions about what was found during the examination.  If the procedure report does not answer your questions, please call your gastroenterologist to clarify.  If you requested that your care partner not be given the details of your procedure findings, then the procedure report has been included in a sealed envelope for you to review at your convenience later.  YOU SHOULD EXPECT: Some feelings of bloating in the abdomen. Passage of more gas than usual.  Walking can help get rid of the air that was put into your GI tract during the procedure and reduce the bloating. If you had a lower endoscopy (such as a colonoscopy or flexible sigmoidoscopy) you may notice spotting of blood in your stool or on the toilet paper. If you underwent a bowel prep for your procedure, you may not have a normal bowel movement for a few days.  Please Note:  You might notice some irritation and congestion in your nose or some drainage.  This is from the oxygen used during your procedure.  There is no need for concern and it should clear up in a day or so.  SYMPTOMS TO REPORT IMMEDIATELY:  Following lower endoscopy (colonoscopy or flexible sigmoidoscopy):  Excessive amounts of blood in the stool  Significant tenderness or worsening of abdominal pains  Swelling of the abdomen that is new, acute  Fever of 100F or higher   For urgent or emergent issues, a gastroenterologist can be reached at any hour by calling [phone]. Do not use MyChart messaging for urgent concerns.    DIET:  We do recommend a small meal at first, but then you may proceed to your regular diet.  Drink plenty of fluids but you should avoid alcoholic beverages for 24 hours.  ACTIVITY:  You should plan to take it easy for the rest of  today and you should NOT DRIVE or use heavy machinery until tomorrow (because of the sedation medicines used during the test).    FOLLOW UP: Our staff will call the number listed on your records 48-72 hours following your procedure to check on you and address any questions or concerns that you may have regarding the information given to you following your procedure. If we do not reach you, we will leave a message.  We will attempt to reach you two times.  During this call, we will ask if you have developed any symptoms of COVID 19. If you develop any symptoms (ie: fever, flu-like symptoms, shortness of breath, cough etc.) before then, please call [phone].  If you test positive for Covid 19 in the 2 weeks post procedure, please call and report this information to [place].    If any biopsies were taken you will be contacted by phone or by letter within the next 1-3 weeks.  Please call us at [phone] if you have not heard about the biopsies in 3 weeks.    SIGNATURES/CONFIDENTIALITY: You and/or your care partner have signed paperwork which will be entered into your electronic medical record.  These signatures attest to the fact that that the information above on your After Visit Summary has been reviewed and is understood.  Full responsibility of the confidentiality of this discharge information lies with you and/or your care-partner.

## 2020-11-25 ENCOUNTER — Telehealth: Payer: Self-pay

## 2020-11-25 NOTE — Telephone Encounter (Signed)
  Follow up Call-  Call back number 11/21/2020  Post procedure Call Back phone  # [phone]  Permission to leave phone message Yes  Some recent data might be hidden     Patient questions:  Do you have a fever, pain , or abdominal swelling? No. Pain Score  0 *  Have you tolerated food without any problems? Yes.    Have you been able to return to your normal activities? Yes.    Do you have any questions about your discharge instructions: Diet   No. Medications  No. Follow up visit  No.  Do you have questions or concerns about your Care? No.  Actions: * If pain score is 4 or above: No action needed, pain <4.  Have you developed a fever since your procedure? No  2.   Have you had an respiratory symptoms (SOB or cough) since your procedure? No   3.   Have you tested positive for COVID 19 since your procedure no   4.   Have you had any family members/close contacts diagnosed with the COVID 19 since your procedure?  No    If yes to any of these questions please route to Joylene John, RN and Joella Prince, RN

## 2020-11-27 ENCOUNTER — Encounter: Payer: Self-pay | Admitting: Gastroenterology

## 2021-02-05 ENCOUNTER — Institutional Professional Consult (permissible substitution): Payer: Medicaid Other | Admitting: Plastic Surgery

## 2021-04-01 ENCOUNTER — Ambulatory Visit (HOSPITAL_COMMUNITY)
Admission: EM | Admit: 2021-04-01 | Discharge: 2021-04-01 | Disposition: A | Discharge status: Home / Self Care | Payer: Medicaid Other | Attending: Psychiatry | Admitting: Psychiatry

## 2021-04-01 DIAGNOSIS — K219 Gastro-esophageal reflux disease without esophagitis: Secondary | ICD-10-CM | POA: Insufficient documentation

## 2021-04-01 DIAGNOSIS — D509 Iron deficiency anemia, unspecified: Secondary | ICD-10-CM | POA: Insufficient documentation

## 2021-04-01 DIAGNOSIS — F259 Schizoaffective disorder, unspecified: Secondary | ICD-10-CM | POA: Insufficient documentation

## 2021-04-01 DIAGNOSIS — I10 Essential (primary) hypertension: Secondary | ICD-10-CM | POA: Insufficient documentation

## 2021-04-01 NOTE — ED Provider Notes (Signed)
Behavioral Health Urgent Care Medical Screening Exam  Patient Name: [name] MRN: [id number] Date of Evaluation: 04/01/21 Chief Complaint:  "I want help with my mental health"  Diagnosis:  Final diagnoses:  Schizoaffective disorder, unspecified type ([place])    History of Present illness: [name] is a 50 y.o. female with past psychiatric history significant for schizoaffective disorder, as well as documented past medical history significant for hemorrhoids, hypertension, GERD, and iron deficiency anemia, who presents to the [facility] behavioral health urgent care [facility]) voluntarily via EMS for walk-in evaluation.  Patient states that she called EMS because "I want help with my mental health".  When patient is asked to provide further details regarding this statement, patient states that she would like assistance with becoming established with a new psychiatrist/psychiatric provider and therapist or a new ACTT team.  Patient states that she asked EMS to bring her to [facility] ED, but she states that she was brought here to the [facility] instead.  Per chart review, it appears that patient was last evaluated at the [facility] on 09/06/2020 and it was documented in this evaluation that patient was receiving psychiatric care through [facility].  Patient states that she is no longer receiving care through her [facility].  She states that she recently began transitioning from her [facility] team to a different psychiatrist and/or therapist, patient states that she has not found/become established with a new psychiatrist, therapist, or ACTT team yet.    Patient endorses passive SI on exam with no associated suicidal intent or plan.  She reports that she has been experiencing this passive SI for the past week and she denies having any suicidal intent or suicidal plan associated with this passive SI over the past week.   Patient denies any specific stressors that are contributing to her passive SI.  She states that her passive SI has been unchanged over the past week.  She denies history of any past suicide attempts.  She denies history of self-injurious behavior via intentionally cutting or burning herself.  She denies homicidal ideations.  She denies auditory hallucinations or visual hallucinations.  Patient describes her sleep as poor but does not provide details regarding number of hours of sleep per night.  Patient states that the only psychotropic medication that she is currently taking at this time is a monthly Aristada IM injection, in which patient states that she received her last injection "the other day" but she does not provide further details regarding exact date of previous injection.  Patient states that at this time, her Aristada injection is still being prescribed by her [facility] provider even though she is in the process of trying to transition her psychiatric care.  Patient denies taking any other psychotropic medications at this time.  Per chart review, patient's last [facility] health [facility] inpatient psychiatric admission was from 11/28/2019 to 11/30/2019.  Patient lives in [place] by herself.  Patient denies access to firearms or weapons.  Patient denies alcohol, tobacco/nicotine, or illicit substance use.  Patient declined for this provider to obtain collateral information at this time.  On exam, patient is sitting upright, in no acute distress.  Eye contact is good.  Speech is clear and coherent with normal rate and normal speech volume.  Mood is euthymic with congruent, appropriate affect.  Thought process is coherent, goal directed, and linear.  Patient does appear to demonstrate some slight thought blocking intermittently  when attempting to answer questions throughout the evaluation, but patient answers all questions appropriately during the evaluation.  Description of  associations intact.  Patient is cooperative and pleasant during the evaluation.  No indication that patient is responding to internal or external stimuli on exam.  No delusional thought content noted on exam.  Psychiatric Specialty Exam  Presentation  General Appearance:Appropriate for Environment; Well Groomed  Eye Contact:Good  Speech:Clear and Coherent; Normal Rate  Speech Volume:Normal  Handedness:Right   Mood and Affect  Mood:Euthymic  Affect:Congruent; Appropriate   Thought Process  Thought Processes:Coherent; Goal Directed; Linear  Descriptions of Associations:Intact  Orientation:Full (Time, Place and Person)  Thought Content:Logical; WDL (Slight thought blocking noted during the evaluation.)  Diagnosis of Schizophrenia or Schizoaffective disorder in past: Yes  Duration of Psychotic Symptoms: Greater than six months  Hallucinations:None  Ideas of Reference:None  Suicidal Thoughts:Yes, Passive Without Intent; Without Plan; Without Means to Carry Out; Without Access to Means  Homicidal Thoughts:No   Sensorium  Memory:Immediate Fair; Recent Fair; Remote [place]  Insight:Fair   Executive Functions  Concentration:Fair  Attention Span:Fair  [place]  Language:Good   Psychomotor Activity  Psychomotor Activity:Tremor   Assets  Assets:Communication Skills; Desire for Improvement; Financial Resources/Insurance; Housing; Leisure Time; Physical Health   Sleep  Sleep:Poor  Number of hours: 8   No data recorded  Physical Exam: Physical Exam Vitals reviewed.  Constitutional:      General: She is not in acute distress.    Appearance: She is not ill-appearing, toxic-appearing or diaphoretic.  HENT:     Head: Normocephalic and atraumatic.     Right Ear: External ear normal.     Left Ear: External ear normal.     Nose: Nose normal.  Eyes:     General:        Right eye: No discharge.        Left  eye: No discharge.     Conjunctiva/sclera: Conjunctivae normal.  Cardiovascular:     Rate and Rhythm: Normal rate.  Pulmonary:     Effort: Pulmonary effort is normal. No respiratory distress.  Musculoskeletal:        General: Normal range of motion.     Cervical back: Normal range of motion.  Neurological:     Mental Status: She is alert and oriented to person, place, and time.     Comments: Slight tremor noted of bilateral upper extremities on exam.  Psychiatric:        Attention and Perception: She does not perceive auditory or visual hallucinations.        Mood and Affect: Mood and affect normal.        Speech: Speech normal.        Behavior: Behavior normal. Behavior is not agitated, slowed, aggressive, withdrawn, hyperactive or combative. Behavior is cooperative.        Thought Content: Thought content is not paranoid or delusional. Thought content does not include homicidal ideation.     Comments: Patient endorses passive SI on exam with no associated suicidal intent or plan.  She reports that she has been experiencing this passive SI for the past week and she denies having any suicidal intent or suicidal plan associated with this passive SI over the past week.  Patient denies any specific stressors that are contributing to her passive SI.  She states that her passive SI has been unchanged over the past week.    Review of Systems  Constitutional:  Positive  for malaise/fatigue. Negative for chills, diaphoresis and fever.  HENT:  Negative for congestion.   Respiratory:  Negative for cough and shortness of breath.   Cardiovascular:  Negative for chest pain and palpitations.  Gastrointestinal:  Negative for abdominal pain, constipation, diarrhea, nausea and vomiting.  Musculoskeletal:  Negative for joint pain and myalgias.  Neurological:  Negative for dizziness and headaches.  Psychiatric/Behavioral:  Negative for depression, hallucinations and substance abuse. The patient has insomnia.         + for passive SI (see HPI for details).  All other systems reviewed and are negative.  Vitals: Blood pressure (!) 150/90, pulse 68, temperature 98.8 F (37.1 C), temperature source Oral, resp. rate 16, SpO2 98 %. There is no height or weight on file to calculate BMI.  Musculoskeletal: Strength & Muscle Tone: within normal limits Gait & Station: normal Patient leans: N/A   [place] MSE Discharge Disposition for Follow up and Recommendations: Based on my evaluation the patient does not appear to have an emergency medical condition and can be discharged with resources and follow up care in outpatient services for Medication Management, Individual Therapy, and Group Therapy  Patient endorses passive SI on exam with no associated suicidal intent or plan.  She reports that she has been experiencing this passive SI for the past week and she denies having any suicidal intent or suicidal plan associated with this passive SI over the past week. She states that her passive SI has been unchanged over the past week.  Patient denies access to firearms or weapons.  She denies history of any past suicide attempts or self-injurious behaviors.  Thus, based on these factors, patient's suicide risk appears to be minimal at this time, patient is not an imminent risk/danger to herself or others at this time, and thus, patient does not meet inpatient psychiatric treatment at this time.  Thus, based on these factors, I believe that it is safe for the patient to be discharged with follow-up care recommendations and safety planning in place.  Patient reports that she is no longer receiving care through her [facility].  I am unable to verify if this is true or not because patient declined for this provider to contact anyone for collateral information concerning her care at this time.  Patient states that she would like assistance with becoming established with a new psychiatrist/psychiatric  provider and therapist or a new ACTT team.  In patient's AVS, I provided contact information for [facility] and [place], for the patient to utilize if she would like to call to inquire about receiving further assistance with her current ACTT team medication management and therapy services/inquire about transitioning her ACTT team medication management and therapy services to a different ACTT team provider.  This information was reviewed with patient personally by this provider as well.  Additionally, patient lives in [place] and has [employer].  Thus, patient appears to be a candidate for [facility] behavioral health center 2nd floor walk-in open access hours.  If patient is unable to continue her ACTT team services/transition to a new ACTT team, recommend that the patient attend [facility] second-floor walk-in hours for psychiatry and therapy appointments for patient to establish rapport with new therapist as well as discuss her current psychotropic medication regimen with new psychiatric provider.  [facility] walk-in hours for psychiatry and therapy provided to the patient. Recommend that patient continue her  current psychotropic medication regimen as prescribed at this time until she is able to follow up with ACTT/outpatient psychiatric provider.    Safety planning done at length with the patient regarding appropriate actions to take/resources to utilize [facility], nearest ED, 911, suicide prevention Lifeline) if the patient becomes suicidal or homicidal, if the patient's condition rapidly deteriorates/worsen/does not improve, or if the patient begins to experience a mental health crisis.  Patient verbalizes understanding and agreement of the overall plan and recommendations, including safety plan.  All patient's questions answered and concerns  addressed.  Patient discharged.  Patient states that she will contact somebody via phone upon discharge to come pick her up from the [facility].  Prescilla Sours, PA-C 04/01/2021, 2:55 AM

## 2021-04-01 NOTE — Progress Notes (Signed)
°   04/01/21 0150  [place] (Walk-ins at [facility] only)  How Did You Hear About [place]? Self  What Is the Reason for Your Visit/Call Today? [name] is a 50 year old female presenting voluntary to [facility] due to fleeting thoughts of SI with no plan. Per chart, patient has history of Schizoaffective disorder. Patient denied HI, psychosis and alcohol/drug usage. Patient reported onset of SI with no plan was 1 week ago. Patient unable to identify triggers. Patient stated "they brought me to the wrong hospital, transfer me to [facility] ED". Patient continued to share "I rather have evaluation there cause I am going to have to go through these questions all again". Patient then refused to answering any more questions. Patient denied suicide attempts and self-harming behaviors. Patient has history of psych hospitalizations in 2021 and 2020. Patient was thought blocking during assessment. Patient contracted for safety.  How Long Has This Been Causing You Problems? 1 wk - 1 month  Have You Recently Had Any Thoughts About Hurting Yourself? Yes  How long ago did you have thoughts about hurting yourself? For the past 1 week.  Are You Planning to Commit Suicide/Harm Yourself At This time? No  Have you Recently Had Thoughts About [place]? No  Are You Planning To Harm Someone At This Time? No  Are you currently experiencing any auditory, visual or other hallucinations? No  Have You Used Any Alcohol or Drugs in the Past 24 Hours? No  Do you have any current medical co-morbidities that require immediate attention? No  Clinician description of patient physical appearance/behavior: normal/cooperative  What Do You Feel Would Help You the Most Today?  (Transportation to [facility])  If access to [facility] was not available, would you have sought care in the Emergency Department? Yes  Determination of Need Routine (7 days)  Options For Referral Intensive Outpatient  Therapy;Medication Management;Outpatient Therapy;Other: Comment (Continual outpatient services without [facility] or new provider.)

## 2021-04-01 NOTE — Discharge Instructions (Addendum)
° °  [facility] ([address] [place], [place] [address], [phone]) Outpatient Medication Management/Therapy Walk-In Hours:   Therapy Walk-in Hours  Monday-Wednesday: 8 AM until slots are full  Friday: 1 PM to 5 PM  For Monday to Wednesday, it is recommended that patients arrive between 7:30 AM and 7:45 AM because patients will be seen in the order of arrival.  For Friday, we ask that patients arrive between 12 PM to 12:30 PM.  Go to the second floor on arrival and check in.  **Availability is limited; therefore, patients may not be seen on the same day.**  Medication management walk-ins:  Monday to Friday: 8 AM to 11 AM.  It is recommended that patients arrive by 7:30 AM to 7:45 AM because patients will be seen in the order of arrival.  Go to the second floor on arrival and check in.  **Availability is limited; therefore, patients may not be seen on the same day.**    Discharge recommendations:  Patient is to take medications as prescribed. Please see information for follow-up appointment with psychiatry and therapy. Please follow up with your primary care provider for all medical related needs.   Therapy: We recommend that patient participate in individual therapy to address mental health concerns.  Medications: The patient is to contact a medical professional and/or outpatient provider to address any new side effects that develop. Patient should update outpatient providers of any new medications and/or medication changes.   Atypical antipsychotics: If you are prescribed an atypical antipsychotic, it is recommended that your height, weight, BMI, blood pressure, fasting lipid panel, and fasting blood sugar be monitored by your outpatient providers.  Safety:  The patient should abstain from use of illicit substances/drugs and abuse of any medications. If symptoms worsen or do not continue to improve or if the patient becomes actively suicidal or homicidal  then it is recommended that the patient return to the closest hospital emergency department, the [facility], or call 911 for further evaluation and treatment. National Suicide Prevention Lifeline 1-800-SUICIDE or 352-109-5809.  About 988 988 offers 24/7 access to trained crisis counselors who can help people experiencing mental health-related distress. People can call or text 988 or chat 988lifeline.org for themselves or if they are worried about a loved one who may need crisis support.

## 2021-04-02 ENCOUNTER — Ambulatory Visit (HOSPITAL_COMMUNITY)
Admission: EM | Admit: 2021-04-02 | Discharge: 2021-04-03 | Disposition: A | Discharge status: Home / Self Care | Payer: Medicaid Other | Attending: Nurse Practitioner | Admitting: Nurse Practitioner

## 2021-04-02 DIAGNOSIS — R45851 Suicidal ideations: Secondary | ICD-10-CM | POA: Insufficient documentation

## 2021-04-02 DIAGNOSIS — F32A Depression, unspecified: Secondary | ICD-10-CM | POA: Insufficient documentation

## 2021-04-02 DIAGNOSIS — F259 Schizoaffective disorder, unspecified: Secondary | ICD-10-CM | POA: Insufficient documentation
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
## 2021-04-03 ENCOUNTER — Inpatient Hospital Stay (HOSPITAL_COMMUNITY): Admit: 2021-04-03 | Payer: Medicaid Other

## 2021-04-03 ENCOUNTER — Emergency Department (HOSPITAL_COMMUNITY)
Admission: EM | Admit: 2021-04-03 | Discharge: 2021-04-03 | Disposition: A | Discharge status: Home / Self Care | Payer: Medicaid Other | Attending: Emergency Medicine | Admitting: Emergency Medicine

## 2021-04-03 ENCOUNTER — Encounter (HOSPITAL_COMMUNITY): Payer: Self-pay | Admitting: Emergency Medicine

## 2021-04-03 ENCOUNTER — Encounter (HOSPITAL_COMMUNITY): Payer: Self-pay

## 2021-04-03 ENCOUNTER — Ambulatory Visit (HOSPITAL_COMMUNITY)
Admission: RE | Admit: 2021-04-03 | Discharge: 2021-04-03 | Disposition: A | Discharge status: Home / Self Care | Payer: Medicaid Other | Attending: Psychiatry | Admitting: Psychiatry

## 2021-04-03 ENCOUNTER — Other Ambulatory Visit: Payer: Self-pay

## 2021-04-03 ENCOUNTER — Emergency Department (HOSPITAL_COMMUNITY)
Admission: EM | Admit: 2021-04-03 | Discharge: 2021-04-06 | Disposition: A | Discharge status: Other Acute Inpatient Hospital | Payer: Medicaid Other | Source: Home / Self Care | Attending: Emergency Medicine | Admitting: Emergency Medicine

## 2021-04-03 DIAGNOSIS — Z20822 Contact with and (suspected) exposure to covid-19: Secondary | ICD-10-CM | POA: Insufficient documentation

## 2021-04-03 DIAGNOSIS — F99 Mental disorder, not otherwise specified: Secondary | ICD-10-CM

## 2021-04-03 DIAGNOSIS — R45851 Suicidal ideations: Secondary | ICD-10-CM | POA: Insufficient documentation

## 2021-04-03 DIAGNOSIS — I10 Essential (primary) hypertension: Secondary | ICD-10-CM | POA: Insufficient documentation

## 2021-04-03 DIAGNOSIS — J45909 Unspecified asthma, uncomplicated: Secondary | ICD-10-CM | POA: Insufficient documentation

## 2021-04-03 DIAGNOSIS — F29 Unspecified psychosis not due to a substance or known physiological condition: Secondary | ICD-10-CM | POA: Diagnosis not present

## 2021-04-03 DIAGNOSIS — Z5321 Procedure and treatment not carried out due to patient leaving prior to being seen by health care provider: Secondary | ICD-10-CM | POA: Insufficient documentation

## 2021-04-03 DIAGNOSIS — Z046 Encounter for general psychiatric examination, requested by authority: Secondary | ICD-10-CM | POA: Insufficient documentation

## 2021-04-03 DIAGNOSIS — F259 Schizoaffective disorder, unspecified: Secondary | ICD-10-CM | POA: Insufficient documentation

## 2021-04-03 DIAGNOSIS — F17210 Nicotine dependence, cigarettes, uncomplicated: Secondary | ICD-10-CM | POA: Insufficient documentation

## 2021-04-03 DIAGNOSIS — M549 Dorsalgia, unspecified: Secondary | ICD-10-CM | POA: Insufficient documentation

## 2021-04-03 DIAGNOSIS — Y900 Blood alcohol level of less than 20 mg/100 ml: Secondary | ICD-10-CM | POA: Insufficient documentation

## 2021-04-03 LAB — URINALYSIS, ROUTINE W REFLEX MICROSCOPIC
Bacteria, UA: NONE SEEN
Bilirubin Urine: NEGATIVE
Glucose, UA: NEGATIVE mg/dL
Ketones, ur: NEGATIVE mg/dL
Leukocytes,Ua: NEGATIVE
Nitrite: NEGATIVE
Protein, ur: NEGATIVE mg/dL
Specific Gravity, Urine: 1.025 (ref 1.005–1.030)
pH: 6 (ref 5.0–8.0)

## 2021-04-03 LAB — CBC WITH DIFFERENTIAL/PLATELET
Abs Immature Granulocytes: 0.03 10*3/uL (ref 0.00–0.07)
Basophils Absolute: 0.1 10*3/uL (ref 0.0–0.1)
Basophils Relative: 1 %
Eosinophils Absolute: 0.1 10*3/uL (ref 0.0–0.5)
Eosinophils Relative: 1 %
HCT: 29.7 % — ABNORMAL LOW (ref 36.0–46.0)
Hemoglobin: 9.5 g/dL — ABNORMAL LOW (ref 12.0–15.0)
Immature Granulocytes: 0 %
Lymphocytes Relative: 33 %
Lymphs Abs: 3.3 10*3/uL (ref 0.7–4.0)
MCH: 26 pg (ref 26.0–34.0)
MCHC: 32 g/dL (ref 30.0–36.0)
MCV: 81.4 fL (ref 80.0–100.0)
Monocytes Absolute: 0.5 10*3/uL (ref 0.1–1.0)
Monocytes Relative: 5 %
Neutro Abs: 6 10*3/uL (ref 1.7–7.7)
Neutrophils Relative %: 60 %
Platelets: 359 10*3/uL (ref 150–400)
RBC: 3.65 MIL/uL — ABNORMAL LOW (ref 3.87–5.11)
RDW: 17.6 % — ABNORMAL HIGH (ref 11.5–15.5)
WBC: 10.1 10*3/uL (ref 4.0–10.5)
nRBC: 0 % (ref 0.0–0.2)

## 2021-04-03 LAB — RAPID URINE DRUG SCREEN, HOSP PERFORMED
Amphetamines: NOT DETECTED
Barbiturates: NOT DETECTED
Benzodiazepines: NOT DETECTED
Cocaine: NOT DETECTED
Opiates: NOT DETECTED
Tetrahydrocannabinol: POSITIVE — AB

## 2021-04-03 LAB — COMPREHENSIVE METABOLIC PANEL
ALT: 11 U/L (ref 0–44)
AST: 16 U/L (ref 15–41)
Albumin: 3.9 g/dL (ref 3.5–5.0)
Alkaline Phosphatase: 74 U/L (ref 38–126)
Anion gap: 6 (ref 5–15)
BUN: 10 mg/dL (ref 6–20)
CO2: 27 mmol/L (ref 22–32)
Calcium: 9.3 mg/dL (ref 8.9–10.3)
Chloride: 105 mmol/L (ref 98–111)
Creatinine, Ser: 0.78 mg/dL (ref 0.44–1.00)
GFR, Estimated: >60 mL/min (ref >60)
Glucose, Bld: 96 mg/dL (ref 70–99)
Potassium: 3.4 mmol/L — ABNORMAL LOW (ref 3.5–5.1)
Sodium: 138 mmol/L (ref 135–145)
Total Bilirubin: 0.5 mg/dL (ref 0.3–1.2)
Total Protein: 7.9 g/dL (ref 6.5–8.1)

## 2021-04-03 LAB — RESP PANEL BY RT-PCR (FLU A&B, COVID) ARPGX2
Influenza A by PCR: NEGATIVE
Influenza B by PCR: NEGATIVE
SARS Coronavirus 2 by RT PCR: NEGATIVE

## 2021-04-03 LAB — I-STAT BETA HCG BLOOD, ED (MC, WL, AP ONLY): I-stat hCG, quantitative: <5 m[IU]/mL (ref <5)

## 2021-04-03 LAB — ETHANOL: Alcohol, Ethyl (B): <10 mg/dL (ref <10)

## 2021-04-03 NOTE — ED Notes (Signed)
Attempted to obtain pt vitals, pt laughed and said "the answer is no"

## 2021-04-03 NOTE — ED Notes (Signed)
Pt eloped from this ED. Provider S.Gray MD made aware.

## 2021-04-03 NOTE — ED Notes (Signed)
Patient has left the emergency home. The doctor along the charge Nurse and RN for the patient all have been notify.

## 2021-04-03 NOTE — ED Provider Notes (Signed)
Behavioral Health Urgent Care Medical Screening Exam  Patient Name: [name] MRN: [id number] Date of Evaluation: 04/03/21 Chief Complaint:   Diagnosis:  Final diagnoses:  Schizoaffective disorder, unspecified type ([place])    History of Present illness: [name] is a 50 y.o. female with a history of schizoaffective disorder who presents to [facility] voluntarily with law enforcement.  Patient states that she requested that law enforcement take her to [facility].  Patient reports that she has been feeling more depressed.  She endorses passive suicidal ideations without any intent or plan.  She denies a history of suicide attempts.  Patient reports that she is continued to be followed by Dr. [name] with [facility].  She states that she is compliant with her monthly injection of Aristada.  She does not recall when she received her last injection.  She reports that she does not take any oral psychotropic medications.   Per chart review, patient's last current behavioral health [facility] inpatient psychiatric admission was from 11/28/2019 to 11/30/2019.  On evaluation, patient is alert and oriented x4.  Eye contact is fair.  It is clear and coherent.  Mood is depressed and affect is congruent with mood.  Patient appears to have some thought blocking.  Patient pauses for several seconds when answering questions.  She denies auditory and visual hallucination.  No indication that she is responding to internal stimuli.  No delusions elicited during this assessment.  She reports passive suicidal ideations without any intent or plan.  She denies homicidal ideations.  She denies use of alcohol, marijuana, and other substances.  Discussed admission for continuous assessment.  Patient requests discharge home.  Patient is not an imminent risk to self or others at this time.  Nursing arrange transport home with law enforcement.  Psychiatric Specialty  Exam  Presentation  General Appearance:Appropriate for Environment; Well Groomed  Eye Contact:Fair  Speech:Normal Rate; Clear and Coherent  Speech Volume:Normal  Handedness:Right   Mood and Affect  Mood:Depressed  Affect:Congruent   Thought Process  Thought Processes:Coherent; Linear; Other (comment) (thought blocking at times)  Descriptions of Associations:Intact  Orientation:Full (Time, Place and Person)  Thought Content:Logical; WDL (Slight thought blocking noted during the evaluation.)  Diagnosis of Schizophrenia or Schizoaffective disorder in past: Yes  Duration of Psychotic Symptoms: Greater than six months  Hallucinations:None  Ideas of Reference:None  Suicidal Thoughts:Yes, Passive Without Intent; Without Plan  Homicidal Thoughts:No   Sensorium  Memory:Immediate Fair; Recent Fair; Remote Fair  Judgment:Fair  Insight:Present   Executive Functions  Concentration:Fair  Attention Span:Fair  [place]   Psychomotor Activity  Psychomotor Activity:Normal   Assets  Assets:Communication Skills; Desire for Improvement; Financial Resources/Insurance; Housing; Physical Health   Sleep  Sleep:Poor  Number of hours: 8   No data recorded  Physical Exam: Physical Exam Constitutional:      General: She is not in acute distress.    Appearance: She is not ill-appearing, toxic-appearing or diaphoretic.  HENT:     Head: Normocephalic.     Right Ear: External ear normal.     Left Ear: External ear normal.  Eyes:     Conjunctiva/sclera: Conjunctivae normal.     Pupils: Pupils are equal, round, and reactive to light.  Cardiovascular:     Rate and Rhythm: Normal rate.  Pulmonary:     Effort: Pulmonary effort is normal. No respiratory distress.  Musculoskeletal:        General: Normal range of  motion.  Skin:    General: Skin is warm and dry.  Neurological:     Mental Status: She is alert and  oriented to person, place, and time.  Psychiatric:        Mood and Affect: Mood is depressed.        Thought Content: Thought content includes suicidal ideation. Thought content does not include homicidal ideation. Thought content does not include suicidal plan.   Review of Systems  Constitutional:  Negative for chills, diaphoresis, fever, malaise/fatigue and weight loss.  HENT:  Negative for congestion.   Respiratory:  Negative for cough and shortness of breath.   Cardiovascular:  Negative for chest pain and palpitations.  Gastrointestinal:  Negative for diarrhea, nausea and vomiting.  Neurological:  Negative for dizziness and seizures.  Psychiatric/Behavioral:  Positive for depression and suicidal ideas. Negative for hallucinations, memory loss and substance abuse. The patient is nervous/anxious and has insomnia.   All other systems reviewed and are negative.  Blood pressure (!) 175/90, pulse 69, temperature 98.7 F (37.1 C), temperature source Oral, resp. rate 16, SpO2 98 %. There is no height or weight on file to calculate BMI.  Musculoskeletal: Strength & Muscle Tone: within normal limits Gait & Station: normal Patient leans: N/A   [place] MSE Discharge Disposition for Follow up and Recommendations: Based on my evaluation the patient does not appear to have an emergency medical condition and can be discharged with resources and follow up care in outpatient services for Medication Management, Individual Therapy, and follow up with ACT Team  Disposition: No evidence of imminent risk to self or others at present.   Supportive therapy provided about ongoing stressors. Discussed crisis plan, support from social network, calling 911, coming to the Emergency Department, and calling Suicide Hotline.    Rozetta Nunnery, NP 04/03/2021, 12:59 AM

## 2021-04-03 NOTE — ED Triage Notes (Signed)
Patient states she is hearing voices and are saying things that she doesn't like to hear. Patient denies SI/HI. Patient denies any alcohol or drugs.

## 2021-04-03 NOTE — ED Provider Notes (Addendum)
[name] Provider Note   CSN: [id number] Arrival date & time: 04/03/21  1207     History No chief complaint on file.   [name] is a 50 y.o. female.  This is a 50 y.o. female with significant medical history as below, including schizoaffective disorder who presents to the ED from [facility] with IVC.  Patient was evaluated earlier in the emergency department, she eloped from the emergency department prior to completion of evaluation.  Per psychiatric staff she presented at [place] and they sent her to the emergency department under IVC for ongoing evaluation, placement.  Patient has no acute complaints at this time.  She denies SI or HI.  She continues to endorse intermittent hallucinations, paranoia.  Level 5 caveat, psychiatric disturbance  The history is provided by the patient [facility] staff). The history is limited by the condition of the patient. No language interpreter was used.      Past Medical History:  Diagnosis Date   Arthritis    Asthma    Bipolar 1 disorder ([place])    Bronchitis    Depression    Hemorrhoid    Schizoaffective disorder ([place])    Schizophrenia ([place])    Pt denies and reports it is schizoaffective disorder.    Transfusion history     Patient Active Problem List   Diagnosis Date Noted   Schizophrenia ([place]) 02/07/2019   Schizoaffective disorder ([place]) 11/08/2018   No diagnosis on Axis I 08/21/2015   Abnormal behavior    Involuntary commitment    Psychosis ([place])    Disturbance in affect    Tobacco use disorder 05/12/2015   Cannabis use disorder, moderate, dependence ([place]) 05/12/2015   Schizo affective schizophrenia ([place]) 05/10/2015   Hypertension 02/24/2011   ANEMIA-IRON DEFICIENCY 12/13/2006   GERD 12/13/2006   OBESITY 12/12/2006   Schizoaffective disorder, bipolar type ([place]) 12/12/2006   HEMORRHOIDS, NOS 12/12/2006   PAIN-NECK 12/12/2006   Abnormal involuntary movement 12/12/2006    Past Surgical  History:  Procedure Laterality Date   Arm surgery     CESAREAN SECTION     COLONOSCOPY     5-6 years ago per pt-near [place] normal exam per pt     OB History   No obstetric history on file.     Family History  Problem Relation Age of Onset   Hypertension Mother    Hypertension Father    Diabetes type II Other    Stomach cancer Cousin    Colon cancer Neg Hx    Colon polyps Neg Hx    Esophageal cancer Neg Hx    Rectal cancer Neg Hx     Social History   Tobacco Use   Smoking status: Every Day    Packs/day: 0.50    Years: 17.00    Pack years: 8.50    Types: Cigarettes   Smokeless tobacco: Never  Vaping Use   Vaping Use: Every day  Substance Use Topics   Alcohol use: No   Drug use: Not Currently    Types: Marijuana    Comment: Former user    Home Medications Prior to Admission medications   Medication Sig Start Date End Date Taking? Authorizing Provider  acetaminophen (TYLENOL) 325 MG tablet Take 650 mg by mouth every 6 (six) hours as needed.    [provider]  ARISTADA 882 MG/3.2ML prefilled syringe SMARTSIG:1 IM Once a Month 09/19/20   [provider]  Ferrous Sulfate (IRON PO) Take by mouth.  [provider]  naproxen sodium (ANAPROX) 550 MG tablet Take 550 mg by mouth 2 (two) times daily with a meal.    [provider]    Allergies    Anette Guarneri [lurasidone hcl], Abilify [aripiprazole], Flagyl [metronidazole hcl], Prolixin [fluphenazine], Risperidone and related, Seroquel [quetiapine fumarate], and Sulfa antibiotics  Review of Systems   Review of Systems  Unable to perform ROS: Psychiatric disorder   Physical Exam Updated Vital Signs BP 118/90 (BP Location: Left Arm)    Pulse 72    Temp 98.7 F (37.1 C) (Oral)    Resp 18    LMP  (LMP Unknown)    SpO2 99%   Physical Exam Vitals and nursing note reviewed.  Constitutional:      General: She is not in acute distress.    Appearance: Normal appearance.  HENT:      Head: Normocephalic and atraumatic. No raccoon eyes, Battle's sign, right periorbital erythema or left periorbital erythema.     Right Ear: External ear normal.     Left Ear: External ear normal.     Nose: Nose normal.     Mouth/Throat:     Mouth: Mucous membranes are moist.  Eyes:     General: No scleral icterus.       Right eye: No discharge.        Left eye: No discharge.  Cardiovascular:     Rate and Rhythm: Normal rate and regular rhythm.     Pulses: Normal pulses.     Heart sounds: Normal heart sounds.  Pulmonary:     Effort: Pulmonary effort is normal. No respiratory distress.     Breath sounds: Normal breath sounds.  Abdominal:     General: Abdomen is flat.     Tenderness: There is no abdominal tenderness.  Musculoskeletal:        General: Normal range of motion.     Cervical back: Normal range of motion.     Right lower leg: No edema.     Left lower leg: No edema.  Skin:    General: Skin is warm and dry.     Capillary Refill: Capillary refill takes less than 2 seconds.  Neurological:     Mental Status: She is alert.  Psychiatric:        Attention and Perception: She is inattentive. She perceives visual hallucinations.        Mood and Affect: Mood normal.        Speech: Speech is delayed.        Behavior: Behavior is slowed.        Thought Content: Thought content is paranoid and delusional.     Comments: Appears to be responding to internal stimulus     ED Results / Procedures / Treatments   Labs (all labs ordered are listed, but only abnormal results are displayed) Labs Reviewed - No data to display   EKG EKG Interpretation  Date/Time:  Friday April 03 2021 14:59:55 EST Ventricular Rate:  68 PR Interval:  156 QRS Duration: 84 QT Interval:  396 QTC Calculation: 421 R Axis:   76 Text Interpretation: Normal sinus rhythm Low voltage QRS Abnormal ECG similar to prior Confirmed by Wynona Dove ([id number]) on 04/03/2021 4:17:58 PM  Radiology No results  found.  Procedures Procedures   Medications Ordered in ED Medications - No data to display  ED Course  I have reviewed the triage vital signs and the nursing notes.  Pertinent labs & imaging results that were  available during my care of the patient were reviewed by me and considered in my medical decision making (see chart for details).    MDM Rules/Calculators/A&P                         50 yo female here for psychiatric evaluation. She was sent to ED by [facility] and placed under IVC.  She had labs collected earlier in the day and viral swab which was negative. Will not repeat.   Placed in psych hold and under IVC. Dispo pending SW/BH.  She is HDS.  Pending placement currently, resting comfortably.   Final Clinical Impression(s) / ED Diagnoses Final diagnoses:  Psychosis, unspecified psychosis type [facility])    Rx / DC Orders ED Discharge Orders     None        Jeanell Sparrow, DO 04/03/21 1618    Jeanell Sparrow, DO 04/03/21 1619

## 2021-04-03 NOTE — Discharge Instructions (Addendum)
For your behavioral health needs you are advised to continue treatment with the [facility] ACT Team:       [facility]      [address]      [place], [place] [address]      Office: [phone]      Crisis: [phone]

## 2021-04-03 NOTE — BH Assessment (Addendum)
[place] Assessment Progress Note   Per Sheran Fava, NP, this pt requires psychiatric hospitalization at this time.  Farris Has  also finds that pt meets criteria for IVC, which she has initiated.  IVC documents have been faxed to [facility] [place], and at [employer]. Owens Shark confirms receipt.  She has since faxed Findings and Custody Order to this Probation officer.  Off duty [facility] [employer] has filled out Return of Service.  [facility] will not be able to accommodate this pt at this time.  At the direction of Hampton Abbot, MD this writer has sought placement for this pt at facilities outside of the [facility] system.  The following facilities have been contacted to seek placement for this pt, with results as noted:  Beds available, information sent, decision pending: [place]  At capacity: [facility]   Jalene Mullet, [place] Coordinator [phone]

## 2021-04-03 NOTE — BH Assessment (Addendum)
Patient reportedly has left the Emergency Department. Therefore, unable to complete her TTS as ordered by the ED provider. TTS Clinician asked that nursing and/or  EDP to contact TTS if she returns.

## 2021-04-03 NOTE — BH Assessment (Signed)
[place] Assessment Progress Note   This voluntary pt left [facility] AMA before TTS assessment was completed.  Discharge instructions to continue treatment with [facility] ACT Team have been entered in case pt accesses them through [place].  At 09:52 I called [facility] and spoke to Jasper Loser, notifying her of disposition and recommending that they follow up with her ASAP, to which she agreed.  EDP Wynona Dove, DO and pt's nurse, Julius Bowels, have been notified.  Jalene Mullet, [place] Triage Specialist [phone]

## 2021-04-03 NOTE — ED Notes (Signed)
When obtaining vital signs, pt refused temperature and appears to be responding to internal stimuli.

## 2021-04-03 NOTE — ED Triage Notes (Signed)
Pt BIBA.  EMS reports pt from home standing outside with [employer] repeating statements that did not make sense and also reports upper and lower back pain.  Hx of Schizoaffective disorder   BP 170/86 HR 69 97% room air Refused CBG.

## 2021-04-03 NOTE — H&P (Signed)
Behavioral Health Medical Screening Exam  [name] is an 50 y.o. female who presented to [facility] voluntarily to [facility] for assessment of auditory hallucinations and decompensation. Patient reports increased auditory hallucinations that she states are "really loud" that are affecting her mental health to the point she no longer feels safe at home.   Of note patient has presented to [facility] x2 (12/14,15/22) where she was discharged home per her request. She later presented to [place] (04/03/21) via EMS; left emergency room.   On assessment patient reports increased auditory hallucinations "I don't feel safe at home". Frequent pauses; severe thought blocking noted. Patient describes voices as "loud" and "saying things I don't like to hear"; refuses to tell details. She refuses to state whether or not she is having auditory or visual hallucinations. She appears to be possibly responding to external/internal stimuli.   Provider discussed recommendation of inpatient hospitalization and process; patient agreeable to treatment.   Total Time spent with patient: 20 minutes  Psychiatric Specialty Exam: Physical Exam Vitals and nursing note reviewed.  Constitutional:      Appearance: She is obese.  HENT:     Head: Normocephalic.     Nose: Nose normal.     Mouth/Throat:     Mouth: Mucous membranes are moist.     Pharynx: Oropharynx is clear.  Eyes:     Pupils: Pupils are equal, round, and reactive to light.  Cardiovascular:     Comments: Refused vitals Pulmonary:     Effort: Pulmonary effort is normal.  Abdominal:     Comments: Denies any tenderness or pain.   Musculoskeletal:        General: Normal range of motion.     Cervical back: Normal range of motion.  Skin:    General: Skin is warm and dry.  Neurological:     Mental Status: She is alert and oriented to person, place, and time.  Psychiatric:        Attention and Perception: She is inattentive. She perceives auditory hallucinations.         Mood and Affect: Affect is flat and inappropriate.        Speech: Speech is delayed.        Behavior: Behavior is withdrawn.        Thought Content: Thought content is paranoid.        Judgment: Judgment is inappropriate.     Comments: Severe thought blocking noted   Review of Systems  Psychiatric/Behavioral:  Positive for hallucinations and sleep disturbance.   All other systems reviewed and are negative. There were no vitals taken for this visit.There is no height or weight on file to calculate BMI. General Appearance: Casual Eye Contact:  Fair Speech:  Blocked Volume:  Normal Mood:  Dysphoric Affect:  Flat and Restricted Thought Process:  Goal Directed Orientation:  Full (Time, Place, and Person) Thought Content:  Hallucinations: Auditory Suicidal Thoughts:   refused to answer Homicidal Thoughts:   refused to answer Memory:  Immediate;   Fair Recent;   Poor Judgement:  Impaired Insight:  Lacking and Shallow Psychomotor Activity:  Decreased Concentration: Concentration: Poor and Attention Span: Poor Recall:  Poor Fund of Knowledge:Fair Language: Fair Akathisia:  NA Handed:  Right AIMS (if indicated):    Assets:  Desire for Improvement Financial Resources/Insurance Housing Resilience Sleep:     Musculoskeletal: Strength & Muscle Tone: within normal limits Gait & Station: normal Patient leans: N/A  There were no vitals taken for this visit.  Recommendations:  Based on my evaluation the patient does not appear to have an emergency medical condition. Patient recommended for inpatient hospitalization for further observation, stabilization, and treatment. Per [facility] currently at capacity. Report called to ED provider Starkes[place]Perry; pt to be transferred via Safe Transport to [facility] until inpatient bed is available.   Inda Merlin, NP 04/03/2021, 11:33 AM

## 2021-04-03 NOTE — ED Provider Notes (Signed)
[facility] DEPT Provider Note   CSN: [id number] Arrival date & time: 04/03/21  0732     History Chief Complaint  Patient presents with   Psychiatric Evaluation   Back Pain    [name] is a 50 y.o. female.  This is a 50 y.o. female with significant medical history as below, including schizoaffective disorder who presents to the ED with complaint of psych eval. Per chart review patient was seen early this morning at [facility] and was requesting transport to [facility]. She was brought to [facility] instead. She was offered admission earlier this morning per psych note but refused. She was instructed to f/u as o/p.   Patient is an unreliable historian, she has some passive suicidal ideation but no plan.  No homicidal ideation.  Does report hallucinations/delusions that she feels are worse than normal.  She denies back pain, abdominal pain, change in bowel or bladder function, no fevers or chills, no chest pain or dyspnea.  Rashes.  No alcohol use, no illicit drug use recently.  She says she is not taking her medications.   Level 5 caveat, psychiatric condition       The history is provided by the patient. No language interpreter was used.  Back Pain     Past Medical History:  Diagnosis Date   Arthritis    Asthma    Bipolar 1 disorder ([place])    Bronchitis    Depression    Hemorrhoid    Schizoaffective disorder ([place])    Schizophrenia ([place])    Pt denies and reports it is schizoaffective disorder.    Transfusion history     Patient Active Problem List   Diagnosis Date Noted   Schizophrenia ([place]) 02/07/2019   Schizoaffective disorder ([place]) 11/08/2018   No diagnosis on Axis I 08/21/2015   Abnormal behavior    Involuntary commitment    Psychosis ([place])    Disturbance in affect    Tobacco use disorder 05/12/2015   Cannabis use disorder, moderate, dependence ([place]) 05/12/2015   Schizo affective schizophrenia ([place]) 05/10/2015   Hypertension  02/24/2011   ANEMIA-IRON DEFICIENCY 12/13/2006   GERD 12/13/2006   OBESITY 12/12/2006   Schizoaffective disorder, bipolar type ([place]) 12/12/2006   HEMORRHOIDS, NOS 12/12/2006   PAIN-NECK 12/12/2006   Abnormal involuntary movement 12/12/2006    Past Surgical History:  Procedure Laterality Date   Arm surgery     CESAREAN SECTION     COLONOSCOPY     5-6 years ago per pt-near [place] normal exam per pt     OB History   No obstetric history on file.     Family History  Problem Relation Age of Onset   Hypertension Mother    Hypertension Father    Diabetes type II Other    Stomach cancer Cousin    Colon cancer Neg Hx    Colon polyps Neg Hx    Esophageal cancer Neg Hx    Rectal cancer Neg Hx     Social History   Tobacco Use   Smoking status: Every Day    Packs/day: 0.50    Years: 17.00    Pack years: 8.50    Types: Cigarettes   Smokeless tobacco: Never  Vaping Use   Vaping Use: Every day  Substance Use Topics   Alcohol use: No   Drug use: Not Currently    Types: Marijuana    Comment: Former user    Home Medications Prior to Admission medications  Medication Sig Start Date End Date Taking? Authorizing Provider  acetaminophen (TYLENOL) 325 MG tablet Take 650 mg by mouth every 6 (six) hours as needed.    [provider]  ARISTADA 882 MG/3.2ML prefilled syringe SMARTSIG:1 IM Once a Month 09/19/20   [provider]  Ferrous Sulfate (IRON PO) Take by mouth.    [provider]  naproxen sodium (ANAPROX) 550 MG tablet Take 550 mg by mouth 2 (two) times daily with a meal.    [provider]    Allergies    Anette Guarneri [lurasidone hcl], Abilify [aripiprazole], Flagyl [metronidazole hcl], Prolixin [fluphenazine], Risperidone and related, Seroquel [quetiapine fumarate], and Sulfa antibiotics  Review of Systems   Review of Systems  Unable to perform ROS: Psychiatric disorder   Physical Exam Updated Vital Signs BP (!) 151/90 (BP  Location: Right Arm)    Pulse 77    Resp 16    Ht 5\' 5"  (1.651 m)    Wt 117.9 kg    SpO2 98%    BMI 43.25 kg/m   Physical Exam Vitals and nursing note reviewed.  Constitutional:      General: She is not in acute distress.    Appearance: Normal appearance.  HENT:     Head: Normocephalic and atraumatic.     Right Ear: External ear normal.     Left Ear: External ear normal.     Nose: Nose normal.     Mouth/Throat:     Mouth: Mucous membranes are moist.  Eyes:     General: No scleral icterus.       Right eye: No discharge.        Left eye: No discharge.  Cardiovascular:     Rate and Rhythm: Normal rate and regular rhythm.     Pulses: Normal pulses.     Heart sounds: Normal heart sounds.  Pulmonary:     Effort: Pulmonary effort is normal. No respiratory distress.     Breath sounds: Normal breath sounds.  Abdominal:     General: Abdomen is flat.     Tenderness: There is no abdominal tenderness.  Musculoskeletal:        General: Normal range of motion.     Cervical back: Normal range of motion.     Right lower leg: No edema.     Left lower leg: No edema.  Skin:    General: Skin is warm and dry.     Capillary Refill: Capillary refill takes less than 2 seconds.  Neurological:     Mental Status: She is alert.  Psychiatric:        Attention and Perception: She is inattentive. She perceives auditory and visual hallucinations.        Speech: Speech is delayed.        Behavior: Behavior is slowed.        Thought Content: Thought content includes suicidal ideation. Thought content does not include suicidal plan.    ED Results / Procedures / Treatments   Labs (all labs ordered are listed, but only abnormal results are displayed) Labs Reviewed  COMPREHENSIVE METABOLIC PANEL - Abnormal; Notable for the following components:      Result Value   Potassium 3.4 (*)    All other components within normal limits  RAPID URINE DRUG SCREEN, HOSP PERFORMED - Abnormal; Notable for the  following components:   Tetrahydrocannabinol POSITIVE (*)    All other components within normal limits  CBC WITH DIFFERENTIAL/PLATELET - Abnormal; Notable for the following components:  RBC 3.65 (*)    Hemoglobin 9.5 (*)    HCT 29.7 (*)    RDW 17.6 (*)    All other components within normal limits  URINALYSIS, ROUTINE W REFLEX MICROSCOPIC - Abnormal; Notable for the following components:   APPearance HAZY (*)    Hgb urine dipstick LARGE (*)    All other components within normal limits  RESP PANEL BY RT-PCR (FLU A&B, COVID) ARPGX2  ETHANOL  I-STAT BETA HCG BLOOD, ED ([facility], [facility], [facility] ONLY)    EKG None  Radiology No results found.  Procedures Procedures   Medications Ordered in ED Medications - No data to display  ED Course  I have reviewed the triage vital signs and the nursing notes.  Pertinent labs & imaging results that were available during my care of the patient were reviewed by me and considered in my medical decision making (see chart for details).    MDM Rules/Calculators/A&P                          CC: Psychiatric assessment  This patient to the ED for psychiatric assessment; this involves an extensive number of treatment options and is a complaint that carries with it a high risk of complications and morbidity. Vital signs were reviewed. Serious etiologies considered.  Note from approximately 8 hours ago reviewed from [facility]  Record review:   Previous records obtained and reviewed   Work up as above, notable for:  Lab results that were available during my care of the patient were reviewed by me and considered in my medical decision making.   Management: Do not feel she meets criteria for IVC at this time.     Pending TTS consult. I was informed by nursing that the patient had eloped from the ED, she is not under IVC. Observed ambulating with steady gait from the ED by nursing staff.    She did not receive discharge instructions or return precautions.        This chart was dictated using voice recognition software.  Despite best efforts to proofread,  errors can occur which can change the documentation meaning.    Final Clinical Impression(s) / ED Diagnoses Final diagnoses:  Psychiatric problem    Rx / DC Orders ED Discharge Orders     None        Jeanell Sparrow, DO 04/03/21 1324

## 2021-04-03 NOTE — ED Notes (Signed)
Placed pt belongings In tcu in locker 31

## 2021-04-03 NOTE — Progress Notes (Signed)
Pt arrived at [facility] as a walk-in. States she just left [facility] ED. Refused vital signs. Clayborne Dana, RN
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
## 2021-04-03 NOTE — Discharge Instructions (Signed)
°  Discharge recommendations:  Patient is to take medications as prescribed. Please see information for follow-up appointment with psychiatry and therapy. Please follow up with your primary care provider for all medical related needs.   Therapy: We recommend that patient participate in individual therapy to address mental health concerns.  Atypical antipsychotics: If you are prescribed an atypical antipsychotic, it is recommended that your height, weight, BMI, blood pressure, fasting lipid panel, and fasting blood sugar be monitored by your outpatient providers.  Safety:  The patient should abstain from use of illicit substances/drugs and abuse of any medications. If symptoms worsen or do not continue to improve or if the patient becomes actively suicidal or homicidal then it is recommended that the patient return to the closest hospital emergency department, the [facility], or call 911 for further evaluation and treatment. National Suicide Prevention Lifeline 1-800-SUICIDE or 862-383-7747.  About 988 988 offers 24/7 access to trained crisis counselors who can help people experiencing mental health-related distress. People can call or text 988 or chat 988lifeline.org for themselves or if they are worried about a loved one who may need crisis support.   __________________________________ Therapy Walk-in Hours  Monday-Wednesday: 8 AM until slots are full  Friday: 1 PM to 5 PM  For Monday to Wednesday, it is recommended that patients arrive between 7:30 AM and 7:45 AM because patients will be seen in the order of arrival.  For Friday, we ask that patients arrive between 12 PM to 12:30 PM.  Go to the second floor on arrival and check in.  **Availability is limited; therefore, patients may not be seen on the same day.**  Medication management walk-ins:  Monday to Friday: 8 AM to 11 AM.  It is recommended that patients arrive by 7:30 AM to 7:45 AM because  patients will be seen in the order of arrival.  Go to the second floor on arrival and check in.  **Availability is limited; therefore, patients may not be seen on the same day.**

## 2021-04-03 NOTE — ED Notes (Signed)
[employer] transport requested. 

## 2021-04-04 MED ORDER — LOXAPINE SUCCINATE 5 MG PO CAPS
50.0000 mg | ORAL_CAPSULE | Freq: Every day | ORAL | Status: DC
  Administered 2021-04-04: 50 mg via ORAL
  Filled 2021-04-04 (×3): qty 10

## 2021-04-04 MED ORDER — FERROUS SULFATE 325 (65 FE) MG PO TABS
325.0000 mg | ORAL_TABLET | Freq: Every day | ORAL | Status: DC
  Administered 2021-04-05: 10:00:00 325 mg via ORAL
  Filled 2021-04-04 (×2): qty 1

## 2021-04-04 MED ORDER — LORAZEPAM 1 MG PO TABS
1.0000 mg | ORAL_TABLET | Freq: Once | ORAL | Status: AC
  Administered 2021-04-04: 1 mg via ORAL
  Filled 2021-04-04: qty 1

## 2021-04-04 MED ORDER — ACETAMINOPHEN 325 MG PO TABS
650.0000 mg | ORAL_TABLET | Freq: Once | ORAL | Status: AC
  Administered 2021-04-04: 650 mg via ORAL
  Filled 2021-04-04: qty 2

## 2021-04-04 MED ORDER — GABAPENTIN 300 MG PO CAPS
300.0000 mg | ORAL_CAPSULE | Freq: Three times a day (TID) | ORAL | Status: DC
  Administered 2021-04-04 – 2021-04-05 (×3): 300 mg via ORAL
  Filled 2021-04-04 (×5): qty 1

## 2021-04-04 MED ORDER — LOXAPINE SUCCINATE 5 MG PO CAPS
25.0000 mg | ORAL_CAPSULE | Freq: Two times a day (BID) | ORAL | Status: DC
  Administered 2021-04-04 – 2021-04-05 (×2): 25 mg via ORAL
  Filled 2021-04-04 (×7): qty 5

## 2021-04-04 NOTE — ED Notes (Signed)
Pt refuses vitals  

## 2021-04-04 NOTE — H&P (Addendum)
Behavioral Health Medical Screening Exam  [name] is an 50 y.o. female who presented to [facility] voluntarily for assessment of auditory hallucinations and decompensation. Patient reports increased auditory hallucinations that she states are "really loud" that are affecting her mental health to the point she no longer feels safe at home.   Of note patient has presented to [facility] x2 (12/14,15/22) where she was discharged home per her request. She later presented to [place] (04/03/21) via EMS; left emergency room before being seen, stating it was "too many people".   On assessment patient presents calm and withdrawn; disheveled appearance. Reports increased auditory hallucinations "I don't feel safe at home". Frequent pauses; severe thought blocking noted. Patient describes voices as "loud" and "saying things I don't like to hear"; refuses to tell details. She refuses to state whether or not she is having suicidal or homicidal ideations. She appears to be possibly responding to external/internal stimuli.   Provider discussed recommendation of inpatient hospitalization and process; patient agreeable to treatment.   Total Time spent with patient: 20 minutes  Psychiatric Specialty Exam: Physical Exam Vitals and nursing note reviewed.  Constitutional:      Appearance: She is obese.  HENT:     Head: Normocephalic.     Nose: Nose normal.     Mouth/Throat:     Mouth: Mucous membranes are moist.     Pharynx: Oropharynx is clear.  Eyes:     Pupils: Pupils are equal, round, and reactive to light.  Cardiovascular:     Comments: Refused vitals Pulmonary:     Effort: Pulmonary effort is normal.  Abdominal:     Comments: Denies any tenderness or pain.   Musculoskeletal:        General: Normal range of motion.     Cervical back: Normal range of motion.  Skin:    General: Skin is warm and dry.  Neurological:     Mental Status: She is alert and oriented to person, place, and time.  Psychiatric:         Attention and Perception: She is inattentive. She perceives auditory hallucinations.        Mood and Affect: Affect is flat and inappropriate.        Speech: Speech is delayed.        Behavior: Behavior is withdrawn.        Thought Content: Thought content is paranoid.        Judgment: Judgment is inappropriate.     Comments: Severe thought blocking noted   Review of Systems  Psychiatric/Behavioral:  Positive for hallucinations and sleep disturbance.   All other systems reviewed and are negative. Blood pressure (!) 183/97, pulse 66, temperature 98.7 F (37.1 C), temperature source Oral, resp. rate 18, SpO2 100 %.There is no height or weight on file to calculate BMI. General Appearance: Casual Eye Contact:  Fair Speech:  Blocked Volume:  Normal Mood:  Dysphoric Affect:  Flat and Restricted Thought Process:  Goal Directed Orientation:  Full (Time, Place, and Person) Thought Content:  Hallucinations: Auditory Suicidal Thoughts:   refused to answer Homicidal Thoughts:   refused to answer Memory:  Immediate;   Fair Recent;   Poor Judgement:  Impaired Insight:  Lacking and Shallow Psychomotor Activity:  Decreased Concentration: Concentration: Poor and Attention Span: Poor Recall:  Poor Fund of Knowledge:Fair Language: Fair Akathisia:  NA Handed:  Right AIMS (if indicated):    Assets:  Desire for Improvement Financial Resources/Insurance Housing Resilience Sleep:     Musculoskeletal:  Strength & Muscle Tone: within normal limits Gait & Station: normal Patient leans: N/A  Blood pressure (!) 183/97, pulse 66, temperature 98.7 F (37.1 C), temperature source Oral, resp. rate 18, SpO2 100 %.  Recommendations: Based on my evaluation the patient does not appear to have an emergency medical condition. Patient recommended for inpatient hospitalization for further observation, stabilization, and treatment. Per [facility] currently at capacity. Report called to ED provider  [place]; pt to be transferred via Safe Transport to [facility] until inpatient bed is available.   Inda Merlin, NP 04/04/2021, 9:59 AM

## 2021-04-04 NOTE — ED Provider Notes (Signed)
Emergency Medicine Observation Re-evaluation Note  Falynn Ahley Bulls is a 50 y.o. female, seen on rounds today.  Pt initially presented to the ED for complaints of No chief complaint on file. Currently, the patient is resting comfortably.  Physical Exam  BP (!) 166/90    Pulse 70    Temp 98.7 F (37.1 C) (Oral)    Resp 18    LMP  (LMP Unknown)    SpO2 100%  Physical Exam General: NAD   ED Course / MDM  EKG:EKG Interpretation  Date/Time:  Friday April 03 2021 14:59:55 EST Ventricular Rate:  68 PR Interval:  156 QRS Duration: 84 QT Interval:  396 QTC Calculation: 421 R Axis:   76 Text Interpretation: Normal sinus rhythm Low voltage QRS Abnormal ECG similar to prior Confirmed by Wynona Dove ([id number]) on 04/03/2021 4:17:58 PM  I have reviewed the labs performed to date as well as medications administered while in observation.  Recent changes in the last 24 hours include no acute events reported.  Plan  Current plan is for psych evaluation and treatment. Raeghan Demeter is under involuntary commitment.      Valarie Merino, MD 04/04/21 1314

## 2021-04-04 NOTE — ED Notes (Signed)
Pt very lethargic, extremely slow to answer. Unable to obtain if this is behavioral or organic. Pt answers some questions, denies auditory hallucination but states she "is seeing everything" for visual hallucinations. Pt will not answer SI/HI questions. Sitter at bedside, pt in purple scrubs with possessions removed.

## 2021-04-04 NOTE — ED Notes (Signed)
Pt asked if she could have a stretcher to lay on as opposed to the chair that she was sitting in. Pt provided with a stretcher and new warm blankets. Pt is upset that she has been here for so long and hasn't been sent over to the [facility] hospital. I let her know that it was full right now and when they had a discharge they would send her over. Pt okay with this.

## 2021-04-04 NOTE — Consult Note (Signed)
[name] is a 50 year old female with a past psychiatric history significant for schizoaffective disorder. Patient presented to [facility] 12/14, 12/15 where she was discharged per request; [place] 12/16 where she left prior to being assessed, and [facility] where she endorsed auditory hallucinations, paranoid and delusional thought content. Patient currently receives ACTT services via [facility] [phone]; [phone] crisis).   Collateral: Sharyn Lull, LPN (on-call via crisis) Spoke to pt on crisis past Wednesday at midnight; pt stated she wasn't feeling safe in home. Team unsure if delusion. Pt has contacted law enforcement several times. Housing specialist attempted to see patient on Thursday and was unable to find her. Provided updated medication list.   Current medications per ACTT Team:  Abilify Aristada 882mg  given 03/30/21 Loxitane 25 mg BID Loxitane 50 mg HS Gabapentin 300mg  TID Iron 325 daily   Plan:   -Restart medications:    -Loxitane 25 mg BID -Loxitane 50 mg HS -Gabapentin 300mg  TID -Iron 325 daily  -Continue to seek inpatient hospitalization for further observation, stabilization, and treatment.

## 2021-04-05 NOTE — ED Notes (Signed)
Pt given sandwich, cheese and cola per request

## 2021-04-05 NOTE — ED Notes (Signed)
Pharmacy messaged to send Loxitane capsule.

## 2021-04-05 NOTE — ED Notes (Addendum)
Per Tora Perches, SW: 'Per Marva, Admissions, pt has been accepted to [facility]. Accepting provider is Dr. Abbey Chatters. Patient can arrive 04/06/2021. Number for report is [phone]. Please fax IVC paperwork before calling report to fax#: [phone].'

## 2021-04-05 NOTE — ED Provider Notes (Signed)
Emergency Medicine Observation Re-evaluation Note  Francisca Lorilee Cafarella is a 50 y.o. female, seen on rounds today.  Pt initially presented to the ED for complaints of No chief complaint on file. Currently, the patient is sleeping.  Physical Exam  BP (!) 154/102 (BP Location: Left Wrist)    Pulse (!) 55    Temp 98.7 F (37.1 C) (Oral)    Resp 16    LMP  (LMP Unknown)    SpO2 99%  Physical Exam General: sleeping Cardiac: normal rate Lungs: no increased WOB Psych: calm  ED Course / MDM  EKG:EKG Interpretation  Date/Time:  Friday April 03 2021 14:59:55 EST Ventricular Rate:  68 PR Interval:  156 QRS Duration: 84 QT Interval:  396 QTC Calculation: 421 R Axis:   76 Text Interpretation: Normal sinus rhythm Low voltage QRS Abnormal ECG similar to prior Confirmed by Wynona Dove ([id number]) on 04/03/2021 4:17:58 PM  I have reviewed the labs performed to date as well as medications administered while in observation.  Recent changes in the last 24 hours include none.  Plan  Current plan is for awaiting placement. Niyati Heinke is under involuntary commitment.      Malvin Johns, MD 04/05/21 [phone]

## 2021-04-05 NOTE — Progress Notes (Signed)
Per Marva, Admissions, pt has been accepted to [facility]. Accepting provider is Dr. Abbey Chatters. Patient can arrive 04/06/2021. Number for report is [phone]. Please fax IVC paperwork before calling report to fax#: [phone].    Glennie Isle, MSW, LCSW-A Phone: [phone] Disposition/TOC

## 2021-04-05 NOTE — Progress Notes (Signed)
Per [facility], patient meets criteria for inpatient treatment. There are no available or appropriate beds at [facility] today. CSW faxed referrals to the following facilities for review:  [place] N/A [place], [place] [address] [phone] --  [place] N/A [address] [place], [place] [address] [phone] --  [place] [address] [place] [address] [phone] --  [facility]  Pending - Request Sent N/A [address] [place] [address] [phone] --  [place] N/[place] [place] [address] [phone] --  [facility]  Pending - Request Sent N/A [address] [place] [address] [phone] --  [facility]  Pending - Request Sent N/A [place] [place] [address] [phone] --  [facility]  Pending - Request Sent N/A [address] [place] [address] [phone] --  [place] [address] [place] [address] [phone] --  [facility]  Pending - No Request Sent N/A [address], [place] [address] [phone] --  [place]  Pending - No Request Sent N/A [address], [place] [address] [phone] --  [facility]  Pending - No Request Sent N/A [address] [place] [address] [phone] [facility]  Pending - No Request Sent N/A [address] [place] [address] [phone] --  [place]  Pending - No Request Sent  N/A [address] [place] [address] [phone] --  [facility]  Pending - No Request Sent N/A [address] [place] [address] [phone] --  [facility]  Pending - No Request Sent N/A [address] [place] [address] [phone] --   TTS will continue to seek bed placement.  Glennie Isle, MSW, [place], LCAS-A Phone: [phone] Disposition/TOC

## 2021-04-05 NOTE — ED Notes (Signed)
Pt refuses vitals  

## 2021-04-06 MED ORDER — LOXAPINE SUCCINATE 25 MG PO CAPS
50.0000 mg | ORAL_CAPSULE | Freq: Every day | ORAL | Status: DC
  Filled 2021-04-06 (×2): qty 2

## 2021-04-06 NOTE — ED Notes (Signed)
Pt refused to let me recheck BP after itr read high 202/120 (145)

## 2021-04-06 NOTE — ED Notes (Signed)
Pt in hallway bed with eyes closed, resps even and unlabored, sitter at bedside.

## 2021-04-06 NOTE — ED Notes (Signed)
Pt in bed, pt appears happy, pt smiling, pt states that she doesn't want her 4pm gabapentin, pt awaits transport

## 2021-04-06 NOTE — ED Notes (Signed)
Called [phone] and set transport up for 1500

## 2021-04-06 NOTE — ED Notes (Signed)
Pt sitting up in bed, pt awaiting transport, pt sporadically laughing, pt smiling, pt not talking with anyone

## 2021-04-06 NOTE — ED Notes (Signed)
Pt ambulatory from the bathroom, pt refused vital signs at this time.

## 2021-04-06 NOTE — ED Notes (Signed)
Report called to [facility] at [phone] RN Naomie Dean, pt is accepted and can transfer at 3pm.

## 2021-04-06 NOTE — ED Provider Notes (Signed)
Emergency Medicine Observation Re-evaluation Note  [name] is a 50 y.o. female, seen on rounds today.  Pt initially presented to the ED for complaints of No chief complaint on file. Currently, the patient is IVC'd for .  Physical Exam  BP (!) 158/100    Pulse 75    Temp 98 F (36.7 C) (Oral)    Resp 16    LMP  (LMP Unknown)    SpO2 100%  Physical Exam General: obese nad Cardiac: rrr Lungs: cta Psych: awake and alert, aggressive discussion  ED Course / MDM  EKG:EKG Interpretation  Date/Time:  Friday April 03 2021 14:59:55 EST Ventricular Rate:  68 PR Interval:  156 QRS Duration: 84 QT Interval:  396 QTC Calculation: 421 R Axis:   76 Text Interpretation: Normal sinus rhythm Low voltage QRS Abnormal ECG similar to prior Confirmed by Wynona Dove ([id number]) on 04/03/2021 4:17:58 PM  I have reviewed the labs performed to date as well as medications administered while in observation.  Recent changes in the last 24 hours include cleared for placement  Plan  Current plan is for inpatient treatment. [name] is under involuntary commitment.      [name], MD 04/06/21 [phone]

## 2021-04-06 NOTE — ED Notes (Signed)
Meal tray given, updated pt about plan of care

## 2021-04-06 NOTE — BH Assessment (Signed)
[facility] Assessment Progress Note   Pt has been accepted to [facility], who have agreed to receive pt after 15:00 today.  For details please see note authored by Glennie Isle on 04/05/21 at 12:44.  At 12:21 today (04/06/21) this writer called Otila Kluver with pt's ACT Team ([facility]), notifying her of pt's disposition.  EDP [name], MD and pt's nurse, Darnelle Maffucci, have been notified of disposition.  Jalene Mullet, [place] Coordinator [phone]

## 2021-04-06 NOTE — ED Notes (Signed)
Two bags of belongings given to office for pt's transport

## 2021-04-06 NOTE — ED Notes (Signed)
Patient noted to be responding to internal stimuli. Patient pauses, then will begin to laugh, will pause again, and continue, as if she is listening to someone talking. Patient is alone without anyone at the bedside.

## 2021-04-06 NOTE — ED Notes (Signed)
Otila Kluver from ACT team called and wanted to talk about pt and why she is being transferred, number taken and referred to counselor/sw  [phone]

## 2021-04-06 NOTE — ED Notes (Signed)
Sheriff at bedside for transport, pt refusing vital signs at this time.

## 2021-04-13 ENCOUNTER — Telehealth (HOSPITAL_COMMUNITY): Payer: Self-pay | Admitting: Internal Medicine

## 2021-04-13 NOTE — BH Assessment (Signed)
Care Management - Follow Up [facility] Discharges   Patient has been placed in an inpatient psychiatric hospital ([facility]) on 04-06-2021.

## 2021-04-20 ENCOUNTER — Ambulatory Visit (HOSPITAL_COMMUNITY)
Admission: EM | Admit: 2021-04-20 | Discharge: 2021-04-20 | Disposition: A | Discharge status: Home / Self Care | Payer: Medicaid Other | Attending: Psychiatry | Admitting: Psychiatry

## 2021-04-20 DIAGNOSIS — R251 Tremor, unspecified: Secondary | ICD-10-CM | POA: Insufficient documentation

## 2021-04-20 DIAGNOSIS — F419 Anxiety disorder, unspecified: Secondary | ICD-10-CM | POA: Insufficient documentation

## 2021-04-20 DIAGNOSIS — F250 Schizoaffective disorder, bipolar type: Secondary | ICD-10-CM | POA: Insufficient documentation

## 2021-04-20 NOTE — Discharge Instructions (Addendum)

## 2021-04-20 NOTE — ED Provider Notes (Signed)
Behavioral Health Urgent Care Medical Screening Exam  Patient Name: [name] MRN: [id number] Date of Evaluation: 04/20/21 Chief Complaint:   Diagnosis:  Final diagnoses:  Schizoaffective disorder, bipolar type ([place])    History of Present illness: [name] is a 51 y.o. female patient presented to [facility] as a walk in alone with complaints of "I am hearing these voices and feel there is a presence with me".  [name], 51 y.o., female patient seen face to face by this provider, consulted with Dr. Serafina Mitchell; and patient is a psychiatric history of schizoaffective disorder bipolar type.  She has services with [facility]T team.  On 04/20/21.  On evaluation [name] Reports she was recently admitted [facility] [place].  She was discharged 4 days ago.  She was admitted due to auditory hallucinations which she continued to have while she was in the psychiatric hospital.  Reports her voices are the same they have not increased in intensity.  She is however afraid to stay home tonight by herself.  She would like to stay at the [facility] to because she doesn't hear the voices when she is here. She mostly hears them when she is alone.   During evaluation [name] is in sitting position no acute distress.  She is alert/oriented x 4; calm/cooperative.She has some EPS. She has a slight shaking of the head (parkinsonian like). Reports she has also had akathisia. She has been on ingrezza and cogentin in the past.  She makes good eye contact. She  is speaking in a clear tone at moderate volume, and normal pace. She is denying any concerns with appetite or sleep. She endorses depression and anxiety. She endorses some paranoia. She denies visual hallucinations. Endorses auditory hallucinations states, "I hear bad stuff like gang stuff". Denies command hallucinations. She is currently not responding to internal/external stimuli or experiencing  delusional thought content. She deny's suicidal/self-harm/homicidal ideation.She contracts for safety. Denies access to firearms/weapons. Educated patient she does not meet the requirements for inpatient psychiatric criteria.  Discussed with patient if there was anyone that she could stay with this evening.  She states her mother does not live far away but "me and my mom  don't get along and I don't want to stay with her".  Contacted patient's ACTT team [facility].  [facility] states patient was assessed by the psychiatrist today.  MD was informed that patient was having auditory hallucinations.  Discussed patient's presenting symptoms of feeling slightly paranoid, auditory hallucinations.  [facility] states those are similar symptoms that patient normally presents with.  She has no immediate concerns with patient being discharged.  States they cannot come and pick up patient and patient can be provided a bus pass to be able to get home. States they will follow up and see patient tomorrow.   At this time [name] is educated and verbalizes understanding of mental health resources and other crisis services in the community. She is instructed to call 911 and present to the nearest emergency room should she experience any suicidal/homicidal ideation, auditory/visual/hallucinations, or detrimental worsening of her mental health condition.  She was a also advised by Probation officer that she could call the toll-free phone on insurance card to assist with identifying in network counselors and agencies or number on back of Medicaid card t speak with care coordinator    Psychiatric Specialty Exam  Presentation  General Appearance:Appropriate for Environment; Casual  Eye Contact:Fair  Speech:Clear and Coherent; Normal  Rate  Speech Volume:Normal  Handedness:Right   Mood and Affect  Mood:Depressed  Affect:Congruent   Thought Process  Thought Processes:Coherent  Descriptions of  Associations:Intact  Orientation:Full (Time, Place and Person)  Thought Content:Logical  Diagnosis of Schizophrenia or Schizoaffective disorder in past: Yes  Duration of Psychotic Symptoms: Greater than six months  Hallucinations:Auditory voices that talk to her about gangs  Ideas of Reference:None  Suicidal Thoughts:No Without Intent; Without Plan  Homicidal Thoughts:No   Sensorium  Memory:Immediate Good; Recent Good; Remote Good  Judgment:Fair  Insight:Present   Executive Functions  Concentration:Good  Attention Span:Good  Recall:Good  Fund of Knowledge:Good  Language:Good   Psychomotor Activity  Psychomotor Activity:Normal Tardive Dyskinesia No   Assets  Assets:Communication Skills; Desire for Improvement; Financial Resources/Insurance; Resilience; Physical Health   Sleep  Sleep:Good  Number of hours: 8   No data recorded  Physical Exam: Physical Exam Vitals and nursing note reviewed.  Constitutional:      General: She is not in acute distress.    Appearance: Normal appearance. She is not ill-appearing.  HENT:     Head: Normocephalic.  Eyes:     General:        Right eye: No discharge.        Left eye: No discharge.     Conjunctiva/sclera: Conjunctivae normal.  Cardiovascular:     Rate and Rhythm: Normal rate.  Pulmonary:     Effort: Pulmonary effort is normal.  Musculoskeletal:        General: Normal range of motion.     Cervical back: Normal range of motion.  Skin:    Coloration: Skin is not jaundiced or pale.  Neurological:     Mental Status: She is alert and oriented to person, place, and time.  Psychiatric:        Attention and Perception: She perceives auditory hallucinations.        Mood and Affect: Affect normal. Mood is depressed.        Speech: Speech normal.        Behavior: Behavior normal. Behavior is cooperative.        Thought Content: Thought content is paranoid.        Cognition and Memory: Cognition normal.         Judgment: Judgment normal.   Review of Systems  Constitutional: Negative.   HENT: Negative.    Eyes: Negative.   Respiratory: Negative.    Cardiovascular: Negative.   Musculoskeletal: Negative.   Skin: Negative.   Neurological: Negative.   Psychiatric/Behavioral:  Positive for depression and hallucinations.   There were no vitals taken for this visit. There is no height or weight on file to calculate BMI.  Musculoskeletal: Strength & Muscle Tone: within normal limits Gait & Station: normal Patient leans: N/A   [place] MSE Discharge Disposition for Follow up and Recommendations: Based on my evaluation the patient does not appear to have an emergency medical condition and can be discharged with resources and follow up care in outpatient services for Medication Management  Discharge patient  Contacted patient's ACTT team [facility].  [facility] states that patient was assessed by the psychiatrist today.  MD was informed that patient was having auditory hallucinations.  Discussed patient's presenting symptoms of feeling slightly paranoid, auditory hallucinations.  [facility] states those are similar symptoms that patient normally presents with.  She has no immediate concerns with patient being discharged.  States they cannot come and pick up patient and patient can be provided a bus pass to  be able to get home.  No evidence of imminent risk to self or others at present.    Patient does not meet criteria for psychiatric inpatient admission. Discussed crisis plan, support from social network, calling 911, coming to the Emergency Department, and calling Suicide Hotline.   Revonda Humphrey, NP 04/20/2021, 5:11 PM

## 2021-04-20 NOTE — Progress Notes (Signed)
°   04/20/21 1705  [place] (Walk-ins at [facility] only)  What Is the Reason for Your Visit/Call Today? 51 year old patient recently seen at [facility] on 04/03/2021 present back with similar complaint. Report she was recently discharged from [facility] 4 days ago and she continues to hear voices and feel there is a present with her. Denied suicidal/homicidal ideations and denied visual hallucinations. Report auditory hallucinations with psychosis. Report she lives alone and she scared to be home alone due ot the voices are telling her someone is out to get her. She report her ACTT Team doctor came to see her today but she got confused with answering the doctor questionss and answering the voices. She reported when she was discharged from [facility] she was still hearing voices.  How Long Has This Been Causing You Problems? <Week  Have You Recently Had Any Thoughts About Hurting Yourself? No  Are You Planning to Commit Suicide/Harm Yourself At This time? No  Have you Recently Had Thoughts About [place]? No  Are You Planning To Harm Someone At This Time? No  Are you currently experiencing any auditory, visual or other hallucinations? No  Have You Used Any Alcohol or Drugs in the Past 24 Hours? No  Do you have any current medical co-morbidities that require immediate attention? No  Clinician description of patient physical appearance/behavior: Patient report thought blocking due to hearing voices, dressed appropriately for the weather  What Do You Feel Would Help You the Most Today? Stress Management;Medication(s)  If access to [facility] was not available, would you have sought care in the Emergency Department? Yes  Determination of Need Routine (7 days)  Options For Referral Medication Management;Outpatient Therapy

## 2021-04-20 NOTE — Progress Notes (Signed)
AVS, including resources, reviewed with patient.  All questions answered.  Patient verbalized understanding of information presented.  Patient denied SI, HI. Stated she felt safe and ready for discharge. All belongings returned from blue locker.  Patient ambulated independently to lobby without issue.  Patient discharged to home in stable condition; no acute distress noted. Given 2 bus passes by Provider.

## 2021-05-01 ENCOUNTER — Telehealth (HOSPITAL_COMMUNITY): Payer: Self-pay | Admitting: Internal Medicine

## 2021-05-01 NOTE — BH Assessment (Signed)
Care Management - Follow Up [facility] Discharges   Writer made contact with the patient. Patient reports that she continues to receive services from her established Database administrator.

## 2021-07-09 ENCOUNTER — Other Ambulatory Visit: Payer: Self-pay

## 2021-07-09 ENCOUNTER — Emergency Department (HOSPITAL_COMMUNITY)
Admission: EM | Admit: 2021-07-09 | Discharge: 2021-07-09 | Disposition: A | Discharge status: Home / Self Care | Payer: Medicaid Other | Attending: Emergency Medicine | Admitting: Emergency Medicine

## 2021-07-09 ENCOUNTER — Encounter (HOSPITAL_COMMUNITY): Payer: Self-pay

## 2021-07-09 DIAGNOSIS — F419 Anxiety disorder, unspecified: Secondary | ICD-10-CM | POA: Insufficient documentation

## 2021-07-09 DIAGNOSIS — F259 Schizoaffective disorder, unspecified: Secondary | ICD-10-CM | POA: Insufficient documentation

## 2021-07-09 DIAGNOSIS — Z20822 Contact with and (suspected) exposure to covid-19: Secondary | ICD-10-CM | POA: Insufficient documentation

## 2021-07-09 DIAGNOSIS — Y900 Blood alcohol level of less than 20 mg/100 ml: Secondary | ICD-10-CM | POA: Diagnosis not present

## 2021-07-09 DIAGNOSIS — F22 Delusional disorders: Secondary | ICD-10-CM | POA: Diagnosis not present

## 2021-07-09 DIAGNOSIS — R251 Tremor, unspecified: Secondary | ICD-10-CM | POA: Diagnosis not present

## 2021-07-09 DIAGNOSIS — Z046 Encounter for general psychiatric examination, requested by authority: Secondary | ICD-10-CM | POA: Diagnosis present

## 2021-07-09 DIAGNOSIS — F411 Generalized anxiety disorder: Secondary | ICD-10-CM | POA: Diagnosis not present

## 2021-07-09 DIAGNOSIS — Z79899 Other long term (current) drug therapy: Secondary | ICD-10-CM | POA: Diagnosis not present

## 2021-07-09 DIAGNOSIS — R440 Auditory hallucinations: Secondary | ICD-10-CM

## 2021-07-09 LAB — COMPREHENSIVE METABOLIC PANEL
ALT: 18 U/L (ref 0–44)
AST: 25 U/L (ref 15–41)
Albumin: 4.5 g/dL (ref 3.5–5.0)
Alkaline Phosphatase: 74 U/L (ref 38–126)
Anion gap: 8 (ref 5–15)
BUN: 18 mg/dL (ref 6–20)
CO2: 20 mmol/L — ABNORMAL LOW (ref 22–32)
Calcium: 9.1 mg/dL (ref 8.9–10.3)
Chloride: 105 mmol/L (ref 98–111)
Creatinine, Ser: 0.96 mg/dL (ref 0.44–1.00)
GFR, Estimated: >60 mL/min (ref >60)
Glucose, Bld: 158 mg/dL — ABNORMAL HIGH (ref 70–99)
Potassium: 3.6 mmol/L (ref 3.5–5.1)
Sodium: 133 mmol/L — ABNORMAL LOW (ref 135–145)
Total Bilirubin: 0.3 mg/dL (ref 0.3–1.2)
Total Protein: 9 g/dL — ABNORMAL HIGH (ref 6.5–8.1)

## 2021-07-09 LAB — RESP PANEL BY RT-PCR (FLU A&B, COVID) ARPGX2
Influenza A by PCR: NEGATIVE
Influenza B by PCR: NEGATIVE
SARS Coronavirus 2 by RT PCR: NEGATIVE

## 2021-07-09 LAB — RAPID URINE DRUG SCREEN, HOSP PERFORMED
Amphetamines: NOT DETECTED
Barbiturates: POSITIVE — AB
Benzodiazepines: NOT DETECTED
Cocaine: NOT DETECTED
Opiates: NOT DETECTED
Tetrahydrocannabinol: NOT DETECTED

## 2021-07-09 LAB — CBC
HCT: 36.2 % (ref 36.0–46.0)
Hemoglobin: 11.4 g/dL — ABNORMAL LOW (ref 12.0–15.0)
MCH: 26.4 pg (ref 26.0–34.0)
MCHC: 31.5 g/dL (ref 30.0–36.0)
MCV: 83.8 fL (ref 80.0–100.0)
Platelets: 321 10*3/uL (ref 150–400)
RBC: 4.32 MIL/uL (ref 3.87–5.11)
RDW: 18 % — ABNORMAL HIGH (ref 11.5–15.5)
WBC: 8.5 10*3/uL (ref 4.0–10.5)
nRBC: 0 % (ref 0.0–0.2)

## 2021-07-09 LAB — I-STAT BETA HCG BLOOD, ED (MC, WL, AP ONLY): I-stat hCG, quantitative: <5 m[IU]/mL (ref <5)

## 2021-07-09 LAB — ACETAMINOPHEN LEVEL: Acetaminophen (Tylenol), Serum: <10 ug/mL — ABNORMAL LOW (ref 10–30)

## 2021-07-09 LAB — ETHANOL: Alcohol, Ethyl (B): <10 mg/dL (ref <10)

## 2021-07-09 LAB — SALICYLATE LEVEL: Salicylate Lvl: <7 mg/dL — ABNORMAL LOW (ref 7.0–30.0)

## 2021-07-09 MED ORDER — LORAZEPAM 1 MG PO TABS: 1.0000 mg | ORAL_TABLET | ORAL | Status: DC | PRN

## 2021-07-09 MED ORDER — OLANZAPINE 5 MG PO TBDP: 5.0000 mg | ORAL_TABLET | Freq: Three times a day (TID) | ORAL | Status: DC | PRN

## 2021-07-09 MED ORDER — ZIPRASIDONE MESYLATE 20 MG IM SOLR: 20.0000 mg | INTRAMUSCULAR | Status: DC | PRN

## 2021-07-09 MED ORDER — ACETAMINOPHEN 325 MG PO TABS: 650.0000 mg | ORAL_TABLET | ORAL | Status: DC | PRN

## 2021-07-09 NOTE — ED Notes (Signed)
Patient DC d off uni to home per provider. Patient alert, calm, cooperative, no s/s of distress. DC information given to and reviewed with patient . Belongings given to patient .  Patient ambulatory off unit , escorted by NT. Patient given bus pass for transportation.  ?

## 2021-07-09 NOTE — Consult Note (Addendum)
[facility] ED Discharge ? ?07/09/2021 1:10 PM ?[name]  ?MRN:  [id number] ? ?Method of visit?: Face to Face  ? ?Principal Problem: Anxiety disorder ?Discharge Diagnoses: Principal Problem: ?  Anxiety disorder ?Active Problems: ?  Schizo affective schizophrenia ([place]) ? ? ?Subjective: [name] is a 51 y.o. female patient admitted with Anxiety , tremors and Paranoia.  She admitted that she has been having involuntary head movement as a side effect of her Psychotropic medications since last year. She tried Ingrezzza for aq short time and stopped as she could not afford it ?This is a 51 years old [name] who came to the ER this morning with complaint of anxiety, head tremors and paranoia.  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   Patient reported an increase in anxiety especially when she leaves her house.  She went to the gym to work out like other people but suddenly became anxious and paranoid.  She believed then people were watching her and monitoring her.  She left and went back home ?Provider contacted Claudell Kyle, staff at [facility] who confirmed they have been in contact with patient since last week.  She also stated that she is not sure of patient been offered Samples of Ingrezza in the past.  She agrees that patient can be discharged and she will contact their field office to visit her today ? ?Total Time spent with patient: 20 minutes ? ?Past Psychiatric History:  Diagnosed with Schizoaffective disorder, Bipolar disorder, Tobacco dependence,Psychosis and Depression.  She has been hospitalized  Multiple times in [facility] ,[place], [place] long time ago. ? ?Past Medical History:  ?Past Medical History:  ?Diagnosis Date  ? Arthritis   ? Asthma   ? Bipolar 1 disorder ([place])   ? Bronchitis   ? Depression   ? Hemorrhoid   ? Schizoaffective disorder ([place])   ? Schizophrenia ([place])   ? Pt denies and reports it is  schizoaffective disorder.   ? Transfusion history   ?  ?Past Surgical History:  ?Procedure Laterality Date  ? Arm surgery    ? CESAREAN SECTION    ? COLONOSCOPY    ? 5-6 years ago per pt-near [place] normal exam per pt  ? ?Family History:  ?Family History  ?Problem Relation Age of Onset  ? Hypertension Mother   ? Hypertension Father   ? Diabetes type II Other   ? Stomach cancer Cousin   ? Colon cancer Neg Hx   ? Colon polyps Neg Hx   ? Esophageal cancer Neg Hx   ? Rectal cancer Neg Hx   ? ?Family Psychiatric  History: Non contributory ?Social History:  ?Social History  ? ?Substance and Sexual Activity  ?Alcohol Use No  ?   ?Social History  ? ?Substance and Sexual Activity  ?Drug Use Not Currently  ? Types: Marijuana  ? Comment: Former user  ?  ?Social History  ? ?Socioeconomic History  ? Marital status: Single  ?  Spouse name: Not on file  ? Number of children: Not on file  ? Years of education: Not on file  ? Highest education level: Not on file  ?Occupational History  ? Occupation: unemployed  ?Tobacco Use  ? Smoking status: Every Day  ?  Packs/day: 0.50  ?  Years: 17.00  ?  Pack years: 8.50  ?  Types: Cigarettes  ? Smokeless tobacco: Never  ?Vaping Use  ?  Vaping Use: Every day  ?Substance and Sexual Activity  ? Alcohol use: No  ? Drug use: Not Currently  ?  Types: Marijuana  ?  Comment: Former user  ? Sexual activity: Not Currently  ?Other Topics Concern  ? Not on file  ?Social History Narrative  ? ** Merged History Encounter **  ?    ? ?Social Determinants of Health  ? ?Financial Resource Strain: Not on file  ?Food Insecurity: Not on file  ?Transportation Needs: Not on file  ?Physical Activity: Not on file  ?Stress: Not on file  ?Social Connections: Not on file  ? ? ?Tobacco Cessation:  A prescription for an FDA-approved tobacco cessation medication was offered at discharge and the patient refused ? ?Current Medications: ?Current Facility-Administered Medications  ?Medication Dose Route Frequency Provider  Last Rate Last Admin  ? acetaminophen (TYLENOL) tablet 650 mg  650 mg Oral Q4H PRN [name], MD      ? OLANZapine zydis (ZYPREXA) disintegrating tablet 5 mg  5 mg Oral Q8H PRN [name], MD      ? And  ? LORazepam (ATIVAN) tablet 1 mg  1 mg Oral PRN [name], MD      ? And  ? ziprasidone (GEODON) injection 20 mg  20 mg Intramuscular PRN [name], MD      ? ?Current Outpatient Medications  ?Medication Sig Dispense Refill  ? amantadine (SYMMETREL) 100 MG capsule Take 100 mg by mouth 3 (three) times daily.    ? ARISTADA 882 MG/3.2ML prefilled syringe Inject 882 mg into the muscle every 30 (thirty) days.    ? cetirizine (ZYRTEC) 10 MG tablet Take 10 mg by mouth daily.    ? ferrous sulfate 325 (65 FE) MG tablet Take 325 mg by mouth 2 (two) times daily with a meal.    ? gabapentin (NEURONTIN) 300 MG capsule Take 300 mg by mouth 3 (three) times daily.    ? Multiple Vitamins-Minerals (WOMENS MULTIVITAMIN) TABS Take 1 tablet by mouth daily.    ? MYSOLINE 50 MG tablet Take 50 mg by mouth 3 (three) times daily.    ? perphenazine (TRILAFON) 8 MG tablet Take 8-16 mg by mouth See admin instructions. Taking 8 mg in the AM and 16 mg at bedtime    ? ?PTA Medications: ?(Not in a hospital admission) ? ? ?Musculoskeletal: ?Strength & Muscle Tone: within normal limits ?Gait & Station: normal ?Patient leans: Front ? ?Psychiatric Specialty Exam: ? ?Presentation  ?General Appearance: Appropriate for Environment; Casual; Neat ? ?Eye Contact:Good ? ?Speech:Clear and Coherent; Normal Rate ? ?Speech Volume:Normal ? ?Handedness:Right ? ? ?Mood and Affect  ?Mood:Anxious ? ?Affect:Congruent ? ? ?Thought Process  ?Thought Processes:Coherent; Goal Directed ? ?Descriptions of Associations:Intact ? ?Orientation:Full (Time, Place and Person) ? ?Thought Content:Paranoid Ideation (People are watching and monitoring her the Gym) ? ?History of Schizophrenia/Schizoaffective disorder:Yes ? ?Duration of Psychotic Symptoms:Greater than six  months ? ?Hallucinations:Hallucinations: None ? ?Ideas of Reference:Paranoia ? ?Suicidal Thoughts:Suicidal Thoughts: No ? ?Homicidal Thoughts:Homicidal Thoughts: No ? ? ?Sensorium  ?Memory:Immediate Good; Recent Good; Remote Fair ? ?Judgment:Fair ? ?Insight:Good ? ? ?Executive Functions  ?Concentration:Good ? ?Attention Span:Good ? ?Recall:Fair ? ?Fund of [place] ? ?Language:Good ? ? ?Psychomotor Activity  ?Psychomotor Activity:Psychomotor Activity: Tremor (Constant involuntary head movement) ? ? ?Assets  ?Assets:Communication Skills; Desire for Improvement; Housing; Social Support; Financial Resources/Insurance ? ? ?Sleep  ?Sleep:Sleep: Fair ? ? ? ?Physical Exam: ?Physical Exam ?ROS ?Blood pressure (!) 143/86, pulse 83, temperature 98.3 ?F (36.8 ?C),  temperature source Oral, resp. rate 18, height '5\' 5"'$  (1.651 m), weight 117 kg, SpO2 97 %. Body mass index is 42.92 kg/m?. ? ? ?Demographic Factors:  ?Low socioeconomic status, Unemployed, and lives with son, receives SS check monthly ? ?Loss Factors: ?NA ? ?Historical Factors: ?NA and denied suicide attempt  ? ?Risk Reduction Factors:   ?Sense of responsibility to family, Living with another person, especially a relative, and Positive social support ? ?Continued Clinical Symptoms:  ?Severe Anxiety and/or Agitation ?Bipolar Disorder:   Depressive phase ?More than one psychiatric diagnosis ? ?Cognitive Features That Contribute To Risk:  ?None   ? ?Suicide Risk:  ?Minimal: No identifiable suicidal ideation.  Patients presenting with no risk factors but with morbid ruminations; may be classified as minimal risk based on the severity of the depressive symptoms ? ? ? ?Plan Of Care/Follow-up recommendations:  Patient is discharged home to be seen by ACT Team today ?Activity:  as tolerated ?Diet:  Regular ? ?Disposition: discharge home. ?Delfin Gant, NP-PMHNP-BC ?07/09/2021, 1:10 PM ? ?

## 2021-07-09 NOTE — Consult Note (Addendum)
[facility] Face-to-Face Psychiatry Consult  ? ?Reason for Consult:  Psychiatric Evaluation. ?Referring Physician:  ER Physician ?Patient Identification: [name] ?MRN:  [id number] ?Principal Diagnosis: Anxiety disorder ?Diagnosis:  Principal Problem: ?  Anxiety disorder ?Active Problems: ?  Schizo affective schizophrenia ([place]) ? ? ?Total Time spent with patient: 45 minutes ? ?Subjective:   ?[name] is a 51 y.o. female patient admitted with Anxiety , tremors and Paranoia.  She admitted that she has been having involuntary head movement as a side effect of her Psychotropic medications since last year. She tried Ingrezzza for aq short time and stopped as she could not afford it.. ? ?HPI:  This is a 51 years old [name] who came to the ER this morning with complaint of anxiety, head tremors and paranoia.  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   Patient reported an increase in anxiety especially when she leaves her house.  She went to the gym to work out like other people but suddenly became anxious and paranoid.  She believed then people were watching her and monitoring her.  She left and went back home.  For the side effect of her Psychotropic Aristada injection which she receives 882 mg injection.  She was given Samples of Ingrezza to try and when she ran out her provider did not get authorization to renew and since then has not been taking it.   She is calm, cooperative and engaging this morning.  She, according to her called and left a message for [facility] in reference to her symptoms but did not hear from them.  Patient lives with her son and she is unemployed but receives SSI monthly.  She reported good sleep 5-6 hours but sleep is broken.  She reported good appetite.  She occasionally feels that somebody is around her but failed short of calling it a hallucination.She denied SI/HI/AVH.Marland Kitchen She now reported less anxiety in  the ER and agrees to follow up with her ACT Team.  Patient is discharged and will be seen today by staff from [facility]. ?Provider contacted Claudell Kyle, staff at [facility] who confirmed they have been in contact with patient since last week.  She also stated that she is not sure of patient been offered Samples of Ingrezza in the past.  She agrees that patient can be discharged and she will contact their field office to visit her today. ? ?Past Psychiatric History: Diagnosed with Schizoaffective disorder, Bipolar disorder, Tobacco dependence,Psychosis and Depression.  She has been hospitalized  Multiple times in [facility] ,[place], [place] long time ago.  ? ?Risk to Self:   ?Risk to Others:   ?Prior Inpatient Therapy:   ?Prior Outpatient Therapy:   ? ?Past Medical History:  ?Past Medical History:  ?Diagnosis Date  ? Arthritis   ? Asthma   ? Bipolar 1 disorder ([place])   ? Bronchitis   ? Depression   ? Hemorrhoid   ? Schizoaffective disorder ([name])   ? Schizophrenia ([place])   ? Pt denies and reports it is schizoaffective disorder.   ? Transfusion history   ?  ?Past Surgical History:  ?Procedure Laterality Date  ? Arm surgery    ? CESAREAN SECTION    ? COLONOSCOPY    ? 5-6 years ago per pt-near [place] normal exam per pt  ? ?Family History:  ?Family History  ?Problem Relation Age of Onset  ? Hypertension Mother   ?  Hypertension Father   ? Diabetes type II Other   ? Stomach cancer Cousin   ? Colon cancer Neg Hx   ? Colon polyps Neg Hx   ? Esophageal cancer Neg Hx   ? Rectal cancer Neg Hx   ? ?Family Psychiatric  History: Non contributory ?Social History:  ?Social History  ? ?Substance and Sexual Activity  ?Alcohol Use No  ?   ?Social History  ? ?Substance and Sexual Activity  ?Drug Use Not Currently  ? Types: Marijuana  ? Comment: Former user  ?  ?Social History  ? ?Socioeconomic History  ? Marital status: Single  ?  Spouse name: Not on file  ? Number of children: Not on file  ? Years of  education: Not on file  ? Highest education level: Not on file  ?Occupational History  ? Occupation: unemployed  ?Tobacco Use  ? Smoking status: Every Day  ?  Packs/day: 0.50  ?  Years: 17.00  ?  Pack years: 8.50  ?  Types: Cigarettes  ? Smokeless tobacco: Never  ?Vaping Use  ? Vaping Use: Every day  ?Substance and Sexual Activity  ? Alcohol use: No  ? Drug use: Not Currently  ?  Types: Marijuana  ?  Comment: Former user  ? Sexual activity: Not Currently  ?Other Topics Concern  ? Not on file  ?Social History Narrative  ? ** Merged History Encounter **  ?    ? ?Social Determinants of Health  ? ?Financial Resource Strain: Not on file  ?Food Insecurity: Not on file  ?Transportation Needs: Not on file  ?Physical Activity: Not on file  ?Stress: Not on file  ?Social Connections: Not on file  ? ?Additional Social History: ?  ? ?Allergies:   ?Allergies  ?Allergen Reactions  ? Anette Guarneri [Lurasidone Hcl] Nausea And Vomiting and Other (See Comments)  ?  Reaction:  Shaking   ? Abilify [Aripiprazole] Other (See Comments)  ?  shaking  ? Flagyl [Metronidazole Hcl] Nausea And Vomiting  ? Prolixin [Fluphenazine] Other (See Comments)  ?  shaking  ? Risperidone And Related Other (See Comments)  ?  shaking  ? Seroquel [Quetiapine Fumarate]   ?  Patient states she had a syncopal episode after taking medication.   ? Sulfa Antibiotics Nausea And Vomiting  ? ? ?Labs:  ?Results for orders placed or performed during the hospital encounter of 07/09/21 (from the past 48 hour(s))  ?Comprehensive metabolic panel     Status: Abnormal  ? Collection Time: 07/09/21  8:11 AM  ?Result Value Ref Range  ? Sodium 133 (L) 135 - 145 mmol/L  ? Potassium 3.6 3.5 - 5.1 mmol/L  ? Chloride 105 98 - 111 mmol/L  ? CO2 20 (L) 22 - 32 mmol/L  ? Glucose, Bld 158 (H) 70 - 99 mg/dL  ?  Comment: Glucose reference range applies only to samples taken after fasting for at least 8 hours.  ? BUN 18 6 - 20 mg/dL  ? Creatinine, Ser 0.96 0.44 - 1.00 mg/dL  ? Calcium 9.1 8.9 -  10.3 mg/dL  ? Total Protein 9.0 (H) 6.5 - 8.1 g/dL  ? Albumin 4.5 3.5 - 5.0 g/dL  ? AST 25 15 - 41 U/L  ? ALT 18 0 - 44 U/L  ? Alkaline Phosphatase 74 38 - 126 U/L  ? Total Bilirubin 0.3 0.3 - 1.2 mg/dL  ? GFR, Estimated >60 >60 mL/min  ?  Comment: (NOTE) ?Calculated using the CKD-EPI Creatinine Equation (2021) ?  ?  Anion gap 8 5 - 15  ?  Comment: Performed at [facility], [place] [address], [place], [place] [address]  ?Ethanol     Status: None  ? Collection Time: 07/09/21  8:11 AM  ?Result Value Ref Range  ? Alcohol, Ethyl (B) <10 <10 mg/dL  ?  Comment: (NOTE) ?Lowest detectable limit for serum alcohol is 10 mg/dL. ? ?For medical purposes only. ?Performed at [facility], [place] [address] ?[place], [place] [address] ?  ?Salicylate level     Status: Abnormal  ? Collection Time: 07/09/21  8:11 AM  ?Result Value Ref Range  ? Salicylate Lvl <4.7 (L) 7.0 - 30.0 mg/dL  ?  Comment: Performed at [facility], [place] [address] [place], [place] [address]  ?Acetaminophen level     Status: Abnormal  ? Collection Time: 07/09/21  8:11 AM  ?Result Value Ref Range  ? Acetaminophen (Tylenol), Serum <10 (L) 10 - 30 ug/mL  ?  Comment: (NOTE) ?Therapeutic concentrations vary significantly. A range of 10-30 ug/mL  ?may be an effective concentration for many patients. However, some  ?are best treated at concentrations outside of this range. ?Acetaminophen concentrations >150 ug/mL at 4 hours after ingestion  ?and >50 ug/mL at 12 hours after ingestion are often associated with  ?toxic reactions. ? ?Performed at [facility], [place] [address] ?[place], [place] [address] ?  ?cbc     Status: Abnormal  ? Collection Time: 07/09/21  8:11 AM  ?Result Value Ref Range  ? WBC 8.5 4.0 - 10.5 K/uL  ? RBC 4.32 3.87 - 5.11 MIL/uL  ? Hemoglobin 11.4 (L) 12.0 - 15.0 g/dL  ? HCT 36.2 36.0 - 46.0 %  ? MCV 83.8 80.0 - 100.0 fL  ? MCH 26.4 26.0 - 34.0 pg  ? MCHC 31.5 30.0 - 36.0 g/dL  ?  RDW 18.0 (H) 11.5 - 15.5 %  ? Platelets 321 150 - 400 K/uL  ? nRBC 0.0 0.0 - 0.2 %  ?  Comment: Performed at [facility], [place] [address] [place],  [address]  ?Resp Panel by RT-PCR (Flu

## 2021-07-09 NOTE — ED Notes (Signed)
Pt belongings are in locker 28 in tcu  ?

## 2021-07-09 NOTE — BH Assessment (Signed)
@  1106, Clinician informed that patient was already evaluated by the [facility] provider Reginold Agent, NP). No TTS assessment. Per the provider patient's disposition is pending collateral information from her ACTT provider in the community.  ?

## 2021-07-09 NOTE — BH Assessment (Signed)
@  1052, called the TTS machine, no answer.  ?

## 2021-07-09 NOTE — BH Assessment (Signed)
[place] Assessment Progress Note ?  ?Per Charmaine Downs, NP, this voluntary pt does not require psychiatric hospitalization at this time.  Pt is psychiatrically cleared.  Discharge instructions advise pt to continue treatment with the [facility] ACT Team.  At 13:01 I called them and spoke to Maribel.  She reports that they will not be able to pick pt up, but will have someone round on her at home today.  Josephine and pt's nurse, Eustaquio Maize, have been notified. ? ?Jalene Mullet, MA ?Triage Specialist ?[phone] ? ?

## 2021-07-09 NOTE — ED Notes (Signed)
Patient to room 28.  Patient oriented to unit and room.  ?

## 2021-07-09 NOTE — ED Provider Notes (Signed)
?[facility] DEPT ?Provider Note ? ? ?CSN: [id number] ?Arrival date & time: 07/09/21  3428 ? ?  ? ?History ? ?Chief Complaint  ?Patient presents with  ? Paranoid  ? Tremors  ? ? ?[name] is a 51 y.o. female. ? ?HPI ?[name] is a 51 year old female with a history of schizoaffective disorder who presents today complaining of tremors, paranoia, since medication change approximately 3 weeks ago.  She reports these were behavioral health medications.  She states that she has been on the [place] for a while and that has not changed.  She states that she lives at home with her son.  She denies any exacerbating factors in her psychosocial situation.  She endorses that she has some diffuse shaking.  She is hearing and seeing things that are not normally there.  Denies any suicidal or homicidal ideation. ?Review of old records reveal complaints of slight shaking in January and akathisia.   ?[facility] followed by [facility].  She does not report any interaction with them today.  She called [phone] and police brought her here. ? ? ?  ? ?Home Medications ?Prior to Admission medications   ?Medication Sig Start Date End Date Taking? Authorizing Provider  ?ARISTADA 882 MG/3.2ML prefilled syringe Inject 882 mg into the muscle every 30 (thirty) days. 09/19/20  Yes [provider]  ?cetirizine (ZYRTEC) 10 MG tablet Take 10 mg by mouth daily.   Yes [provider]  ?gabapentin (NEURONTIN) 300 MG capsule Take 300 mg by mouth 3 (three) times daily.   Yes [provider]  ?Multiple Vitamins-Minerals (WOMENS MULTIVITAMIN) TABS Take 1 tablet by mouth daily.   Yes [provider]  ?   ? ?Allergies    ?Latuda [lurasidone hcl], Abilify [aripiprazole], Flagyl [metronidazole hcl], Prolixin [fluphenazine], Risperidone and related, Seroquel [quetiapine fumarate], and Sulfa antibiotics   ? ?Review of Systems   ?Review of Systems  ?All other systems reviewed and are  negative. ? ?Physical Exam ?Updated Vital Signs ?BP (!) 143/86   Pulse 83   Temp 98.3 ?F (36.8 ?C) (Oral)   Resp 18   Ht 1.651 m (_0 )   Wt 117 kg   SpO2 97%   BMI 42.92 kg/m?  ?Physical Exam ?Vitals and nursing note reviewed.  ?Constitutional:   ?   Appearance: Normal appearance. She is obese. She is not ill-appearing.  ?HENT:  ?   Head: Normocephalic.  ?   Right Ear: External ear normal.  ?   Left Ear: External ear normal.  ?   Nose: Nose normal.  ?   Mouth/Throat:  ?   Pharynx: Oropharynx is clear.  ?Eyes:  ?   Pupils: Pupils are equal, round, and reactive to light.  ?Cardiovascular:  ?   Rate and Rhythm: Normal rate and regular rhythm.  ?   Pulses: Normal pulses.  ?Pulmonary:  ?   Effort: Pulmonary effort is normal.  ?   Breath sounds: Normal breath sounds.  ?Abdominal:  ?   General: Abdomen is flat.  ?   Palpations: Abdomen is soft.  ?Musculoskeletal:     ?   General: Normal range of motion.  ?   Cervical back: Normal range of motion.  ?Skin: ?   General: Skin is warm and dry.  ?   Capillary Refill: Capillary refill takes less than 2 seconds.  ?Neurological:  ?   General: No focal deficit present.  ?   Mental Status: She is alert.  ?  Comments: Some intermittent coarse shaking when 2-3 times momentarily during interview when she discussed the tremors.  Otherwise no shaking or acute neuro deficits.  ?Psychiatric:  ?   Comments: Patient denies si, hi ?Endorses hearing patient  ? ? ?ED Results / Procedures / Treatments   ?Labs ?(all labs ordered are listed, but only abnormal results are displayed) ?Labs Reviewed  ?COMPREHENSIVE METABOLIC PANEL - Abnormal; Notable for the following components:  ?    Result Value  ? Sodium 133 (*)   ? CO2 20 (*)   ? Glucose, Bld 158 (*)   ? Total Protein 9.0 (*)   ? All other components within normal limits  ?SALICYLATE LEVEL - Abnormal; Notable for the following components:  ? Salicylate Lvl <1.6 (*)   ? All other components within normal limits  ?ACETAMINOPHEN LEVEL -  Abnormal; Notable for the following components:  ? Acetaminophen (Tylenol), Serum <10 (*)   ? All other components within normal limits  ?CBC - Abnormal; Notable for the following components:  ? Hemoglobin 11.4 (*)   ? RDW 18.0 (*)   ? All other components within normal limits  ?RAPID URINE DRUG SCREEN, HOSP PERFORMED - Abnormal; Notable for the following components:  ? Barbiturates POSITIVE (*)   ? All other components within normal limits  ?RESP PANEL BY RT-PCR (FLU A&B, COVID) [id number]  ?ETHANOL  ?I-STAT BETA HCG BLOOD, ED ([facility], [facility], [facility] ONLY)  ? ? ?EKG ?None ? ?Radiology ?No results found. ? ?Procedures ?Procedures  ? ? ?Medications Ordered in ED ?Medications - No data to display ? ?ED Course/ Medical Decision Making/ A&P ?Clinical Course as of 07/10/21 1051  ?Thu Jul 09, 2021  ?1001 Rapid urine drug screen (hospital performed)(!) ?Urine drug screen reviewed and positive for barbiturates otherwise negative [DR]  ?1096 Salicylate level(!) ?Salicylate level is nondetectable [DR]  ?1001 Ethanol ?Ethanol level is nondetectable [DR]  ?1001 Hemoglobin is 11.4 which is increased from her first prior of 9 [DR]  ?1002 I-Stat beta hCG blood, ED ?Patient's i-STAT beta-hCG is less than 5 ?Respiratory panel is negative ?Acetaminophen level is nondetectable [DR]  ?1002 Comprehensive metabolic panel(!) ?C-Met reviewed sodium is 133 which is mildly hyponatremic ?Glucose is slightly elevated at 158 ?CO2 20 ?Anion gap is 8 which is nonsuggestive of acute abnormality especially DKA [DR]  ?  ?Clinical Course User Index ?[DR] [name], MD  ? ?                        ?Medical Decision Making ?51 yo female with ho schizoaffective disorder who presents today stating that she has had some changes in her psychosis with hearing voices more since the medication change.  I have asked the pharmacy tech to review and add to the medication list.  I do not see any recent acute medication changes although patient reports this. ?Patient  physically appears hemodynamically stable and although she reports tremors/for sign no acute abnormalities on physical exam the tremors appear to be associated with the patient discussing them.  They were fleeting and momentary.  The remainder of her neurological exam is normal. ?Patient will need evaluation by behavioral health team.  She may require some medication adjustments.  She does not appear to require IVC at this time although she does report some hallucinations.  Is unclear if these are above baseline.  She is not suicidal or homicidal ?Patient has mildly decreased sodium at 133.  This will need to be followed  as an outpatient but does not require any acute intervention at this time ?Patient has elevated glucose at 158.  She is morbidly obese.  I do not find any history of diabetes.  Blood sugars have been in the low 100s previously.  Require having this followed and may need to be started on oral hypoglycemics if it remains elevated.  Would not make the decision on this one-time elevated blood sugar patient has not been fasting. ?We will place psych hold orders. ? ?Amount and/or Complexity of Data Reviewed ?External Data Reviewed: notes. ?Labs: ordered. Decision-making details documented in ED Course. ? ? ? ? ? ? ? ? ? ?Final Clinical Impression(s) / ED Diagnoses ?Final diagnoses:  ?Schizoaffective disorder, unspecified type ([place])  ?Auditory hallucination  ?Medication course changed  ? ? ?Rx / DC Orders ?ED Discharge Orders   ? ?      Ordered  ?  Increase activity slowly       ? 07/09/21 1326  ?  Diet - low sodium heart healthy       ? 07/09/21 1326  ?  Increase activity slowly       ? 07/09/21 1341  ? ?  ?  ? ?  ? ? ?  ?[name], MD ?07/10/21 1051 ? ?

## 2021-07-09 NOTE — ED Triage Notes (Signed)
Pt reports paranoia, visual hallucinations, and tremors for a few weeks since starting a new medication for schizoaffective disorder. Denies SI/HI at this time.  ?

## 2021-07-09 NOTE — Discharge Instructions (Signed)
For your behavioral health needs you are advised to continue treatment with the [facility]: ? ?     [facility] ?     [address] ?     [place], [place] [address] ?     Office: [phone] ?     Crisis: [phone]  ?

## 2021-07-09 NOTE — BH Assessment (Addendum)
@  1026, requested patient's nurse (Destiny, RN) to place the TTS machine in patient's room.  ?

## 2022-01-04 ENCOUNTER — Ambulatory Visit (HOSPITAL_COMMUNITY)
Admission: EM | Admit: 2022-01-04 | Discharge: 2022-01-05 | Payer: Medicaid Other | Attending: Psychiatry | Admitting: Psychiatry

## 2022-01-04 ENCOUNTER — Encounter (HOSPITAL_COMMUNITY): Payer: Self-pay | Admitting: Emergency Medicine

## 2022-01-04 DIAGNOSIS — F22 Delusional disorders: Secondary | ICD-10-CM | POA: Insufficient documentation

## 2022-01-04 DIAGNOSIS — Z20822 Contact with and (suspected) exposure to covid-19: Secondary | ICD-10-CM | POA: Insufficient documentation

## 2022-01-04 DIAGNOSIS — Z9151 Personal history of suicidal behavior: Secondary | ICD-10-CM | POA: Insufficient documentation

## 2022-01-04 DIAGNOSIS — F259 Schizoaffective disorder, unspecified: Secondary | ICD-10-CM | POA: Insufficient documentation

## 2022-01-04 DIAGNOSIS — R251 Tremor, unspecified: Secondary | ICD-10-CM | POA: Insufficient documentation

## 2022-01-04 DIAGNOSIS — Z79899 Other long term (current) drug therapy: Secondary | ICD-10-CM | POA: Insufficient documentation

## 2022-01-04 LAB — POC SARS CORONAVIRUS 2 AG: SARSCOV2ONAVIRUS 2 AG: NEGATIVE

## 2022-01-04 LAB — RESP PANEL BY RT-PCR (FLU A&B, COVID) ARPGX2
Influenza A by PCR: NEGATIVE
Influenza B by PCR: NEGATIVE
SARS Coronavirus 2 by RT PCR: NEGATIVE

## 2022-01-04 MED ORDER — PERPHENAZINE 4 MG PO TABS
8.0000 mg | ORAL_TABLET | Freq: Every day | ORAL | Status: DC
  Administered 2022-01-05: 8 mg via ORAL
  Filled 2022-01-04: qty 2

## 2022-01-04 MED ORDER — PRIMIDONE 50 MG PO TABS
100.0000 mg | ORAL_TABLET | Freq: Three times a day (TID) | ORAL | Status: DC
  Administered 2022-01-05 (×2): 100 mg via ORAL
  Filled 2022-01-04 (×7): qty 2

## 2022-01-04 MED ORDER — HYDROXYZINE HCL 25 MG PO TABS: 25.0000 mg | ORAL_TABLET | Freq: Three times a day (TID) | ORAL | Status: DC | PRN

## 2022-01-04 MED ORDER — MAGNESIUM HYDROXIDE 400 MG/5ML PO SUSP: 30.0000 mL | Freq: Every day | ORAL | Status: DC | PRN

## 2022-01-04 MED ORDER — CLONAZEPAM 1 MG PO TABS
2.0000 mg | ORAL_TABLET | Freq: Every day | ORAL | Status: DC
  Filled 2022-01-04: qty 2

## 2022-01-04 MED ORDER — OLANZAPINE 5 MG PO TBDP: 5.0000 mg | ORAL_TABLET | Freq: Three times a day (TID) | ORAL | Status: DC | PRN

## 2022-01-04 MED ORDER — AMANTADINE HCL 100 MG PO CAPS
100.0000 mg | ORAL_CAPSULE | Freq: Three times a day (TID) | ORAL | Status: DC
  Administered 2022-01-05: 100 mg via ORAL
  Filled 2022-01-04 (×2): qty 1

## 2022-01-04 MED ORDER — PERPHENAZINE 4 MG PO TABS: 8.0000 mg | ORAL_TABLET | Freq: Every day | ORAL | Status: DC

## 2022-01-04 MED ORDER — CLONAZEPAM 1 MG PO TABS
1.0000 mg | ORAL_TABLET | Freq: Once | ORAL | Status: DC
  Filled 2022-01-04: qty 1

## 2022-01-04 MED ORDER — ALUM & MAG HYDROXIDE-SIMETH 200-200-20 MG/5ML PO SUSP: 30.0000 mL | ORAL | Status: DC | PRN

## 2022-01-04 MED ORDER — LORAZEPAM 1 MG PO TABS: 1.0000 mg | ORAL_TABLET | ORAL | Status: DC | PRN

## 2022-01-04 MED ORDER — PERPHENAZINE 4 MG PO TABS: 16.0000 mg | ORAL_TABLET | Freq: Every day | ORAL | Status: DC

## 2022-01-04 MED ORDER — PERPHENAZINE 4 MG PO TABS: 8.0000 mg | ORAL_TABLET | Freq: Once | ORAL | Status: DC

## 2022-01-04 MED ORDER — ARIPIPRAZOLE LAUROXIL ER 882 MG/3.2ML IM PRSY
882.0000 mg | PREFILLED_SYRINGE | INTRAMUSCULAR | Status: DC
  Filled 2022-01-04 (×2): qty 1

## 2022-01-04 MED ORDER — GABAPENTIN 300 MG PO CAPS
300.0000 mg | ORAL_CAPSULE | Freq: Three times a day (TID) | ORAL | Status: DC
  Administered 2022-01-05: 300 mg via ORAL
  Filled 2022-01-04 (×2): qty 1

## 2022-01-04 MED ORDER — ZIPRASIDONE MESYLATE 20 MG IM SOLR: 20.0000 mg | INTRAMUSCULAR | Status: DC | PRN

## 2022-01-04 NOTE — Progress Notes (Deleted)
   01/04/22 1000  [place] (Walk-ins at [facility] only)  How Did You Hear About [place]? Legal System  What Is the Reason for Your Visit/Call Today? Patient presents voluntarily via [employer] after she reached out to them this morning.  Patient appears to have significant thought blocking with long delays with responses.  She is tearful and struggles to engage with assessment.  She will shake or not her head, with some minimal responses.  She indicated she is not suicidal or homicidal.  She paused and then shook her head that she is not experiencing AVH.  No recent substance use.  How Long Has This Been Causing You Problems? 1 wk - 1 month  Have You Recently Had Any Thoughts About Hurting Yourself? No  Are You Planning to Commit Suicide/Harm Yourself At This time? No  Have you Recently Had Thoughts About [place]? No  Are You Planning To Harm Someone At This Time? No  Are you currently experiencing any auditory, visual or other hallucinations? Yes  Please explain the hallucinations you are currently experiencing: Denies, but appears to be responding to internal stimuli  Have You Used Any Alcohol or Drugs in the Past 24 Hours? No  Do you have any current medical co-morbidities that require immediate attention? No  Clinician description of patient physical appearance/behavior: Patient presents with thought blocking, with delayed responses.  She is alert and only oriented to person.  What Do You Feel Would Help You the Most Today? Treatment for Depression or other mood problem  If access to [facility] Urgent Care was not available, would you have sought care in the Emergency Department? No  Determination of Need Urgent (48 hours)  Options For Referral Inpatient Hospitalization;BH Urgent Care

## 2022-01-04 NOTE — ED Notes (Signed)
Pt refused all night time meds. 

## 2022-01-04 NOTE — ED Notes (Signed)
Patient refused EKG.

## 2022-01-04 NOTE — ED Notes (Signed)
Patient arrived on unit. Patient responding properly to directives. Patient given meal. Patient is talking and cooperative. Patient safe on unit with continued observation.

## 2022-01-04 NOTE — ED Notes (Signed)
Pt requested medication stating she was seeing and hearing things.  Medication orders were placed.  Once meds were pulled pt refused medications.  She stated "I feel better, I believe I have taken my meds this morning."

## 2022-01-04 NOTE — Progress Notes (Signed)
   01/04/22 1000  [place] (Walk-ins at [facility] only)  How Did You Hear About [place]? Legal System  What Is the Reason for Your Visit/Call Today? Patient presents voluntarily via [employer] after she reached out to them this morning.  Patient appears to have significant thought blocking with long delays with responses.  She is tearful and struggles to engage with assessment.  She will shake or nod her head, with some minimal responses.  She indicated she is not suicidal or homicidal.  She paused and then shook her head that she is not experiencing AVH.  No recent substance use.  How Long Has This Been Causing You Problems? 1 wk - 1 month  Have You Recently Had Any Thoughts About Hurting Yourself? No  Are You Planning to Commit Suicide/Harm Yourself At This time? No  Have you Recently Had Thoughts About [place]? No  Are You Planning To Harm Someone At This Time? No  Are you currently experiencing any auditory, visual or other hallucinations? Yes  Please explain the hallucinations you are currently experiencing: Denies, but appears to be responding to internal stimuli  Have You Used Any Alcohol or Drugs in the Past 24 Hours? No  Do you have any current medical co-morbidities that require immediate attention? No  Clinician description of patient physical appearance/behavior: Patient presents with thought blocking, with delayed responses.  She is alert and only oriented to person.  What Do You Feel Would Help You the Most Today? Treatment for Depression or other mood problem  If access to [facility] Urgent Care was not available, would you have sought care in the Emergency Department? No  Determination of Need Urgent (48 hours)  Options For Referral Inpatient Hospitalization;BH Urgent Care

## 2022-01-04 NOTE — ED Notes (Signed)
[employer] called for IVC service 4 copies in chart

## 2022-01-04 NOTE — ED Provider Notes (Cosign Needed Addendum)
[facility] Continuous Assessment Admission H&P  Date: 01/04/22 Patient Name: [name] MRN: [id number] Chief Complaint: No chief complaint on file.     Diagnoses:  Final diagnoses:  Schizoaffective disorder, unspecified type ([place])    HPI: [name] 51 y.o., female patient presented to [facility] as a walk and after calling 911 because she was afraid.  [employer] accompanying patient voluntarily to [place] for assessment.   [name], 51 y.o., female patient seen face to face by this provider, consulted with Dr. Leverne Humbles; and chart reviewed on 01/04/22.  Per chart review patient has a past psychiatric history of schizoaffective disorder.  She has services in place with ACTT team strategic interventions.  During evaluation [name] is observed sitting in the assessment room in her chair.  She is disheveled.  She has chronic EPS and akathisia.  She shakes her head back-and-forth, her hands shake, and she is consistently moving her leg in a tapping motion.  Her speech is clear at times but for most of the assessment she does not answer questions and has a blunted stare.  She is tearful throughout the assessment.  She appears to be experiencing thought blocking but when she does speak appears to be scattered.  When she is able to answer a question she is extremely slow to respond or she nods her head.  She is extremely paranoid.  This writer left the room to contact ACTT team.  When arriving back into the room patient states she called the police this am because she was afraid.  She then states, "the evil people came and got me and held me hostage for 3 years until today".  She did not elaborate any further.  When asked if she is taking her medications she states they do not help she was unable to state if she had been compliant with medications.  She put her hands to her head and stated, "they messed my brain up".  At this time patient did not appear to be responding to  internal/external stimuli.  She is denying SI/HI/AVH.  Collateral obtained by Dr. Sheppard Evens at [facility] team.  Reports patient recently moved to a new apartment. States yesterday patient had called police because she thought she had heard gunshots.  He states patient has been experiencing worsening hallucinations and paranoia but after explaining her presentation on today's visit.  He states it is at appears that patient is far from her baseline.  States patient does have chronic EPS and akathesia. He agrees that patient could benefit from inpatient hospitalization.  Discussed inpatient psychiatric admission.  Patient is initially in agreement.  However she refused lab work, EKG, and UDS.  She did consent to COVID testing.  She is refusing medications.  She is requesting to be discharged home.  Upon reassessment of patient to encourage her to voluntarily stay on the unit and to consider taking medications  She is observed talking to herself and laughing.  She is responding to internal stimuli.  She asked this Probation officer, "why am I hearing these voices in my head".  When trying to ask further questions patient refused and became silent.  Patient stated, "I have medicines at home I want to go home".  At this time patient appears to be psychotic and cannot be discharged safely.  She meets criteria for involuntary commitment and inpatient psychiatric admission.     PHQ 2-9:   [place] ED from 07/09/2021 in [place] DEPT  Most recent reading at 07/09/2021  7:50 AM ED from 04/03/2021 in [place] DEPT Most recent reading at 04/05/2021  1:08 AM ED from 04/03/2021 in [place] DEPT Most recent reading at 04/03/2021  7:47 AM  C-SSRS RISK CATEGORY No Risk No Risk No Risk        Total Time spent with patient: 30 minutes  Musculoskeletal  Strength & Muscle Tone: within normal limits Gait & Station: normal Patient leans:  N/A  Psychiatric Specialty Exam  Presentation General Appearance: Disheveled  Eye Contact:Fleeting  Speech:Blocked; Slow  Speech Volume:Normal  Handedness:Right   Mood and Affect  Mood:Anxious  Affect:Congruent; Tearful   Thought Process  Thought Processes:Coherent  Descriptions of Associations:Intact  Orientation:Partial  Thought Content:Delusions; Paranoid Ideation  Diagnosis of Schizophrenia or Schizoaffective disorder in past: Yes  Duration of Psychotic Symptoms: Greater than six months  Hallucinations:Hallucinations: None  Ideas of Reference:None  Suicidal Thoughts:Suicidal Thoughts: No  Homicidal Thoughts:Homicidal Thoughts: No   Sensorium  Memory:Immediate Poor; Recent Poor; Remote Poor  Judgment:Poor  Insight:Poor   Executive Functions  Concentration:Poor  Attention Span:Poor  Recall:Poor  Fund of Knowledge:Poor  Language:Poor   Psychomotor Activity  Psychomotor Activity:Psychomotor Activity: Extrapyramidal Side Effects (EPS) Extrapyramidal Side Effects (EPS): Akathisia AIMS Completed?: No   Assets  Assets:Physical Health; Resilience   Sleep  Sleep:Sleep: Poor Number of Hours of Sleep: 0   Nutritional Assessment (For OBS and FBC admissions only) Has the patient had a weight loss or gain of 10 pounds or more in the last 3 months?: No Has the patient had a decrease in food intake/or appetite?: Yes Does the patient have dental problems?: No Does the patient have eating habits or behaviors that may be indicators of an eating disorder including binging or inducing vomiting?: No Has the patient recently lost weight without trying?: 2.0 Has the patient been eating poorly because of a decreased appetite?: 1 Malnutrition Screening Tool Score: 3    Physical Exam Vitals and nursing note reviewed.  Constitutional:      General: She is not in acute distress.    Appearance: She is obese. She is not ill-appearing.  HENT:     Head:  Normocephalic.  Eyes:     General:        Right eye: No discharge.        Left eye: No discharge.     Conjunctiva/sclera: Conjunctivae normal.  Cardiovascular:     Rate and Rhythm: Normal rate.  Pulmonary:     Effort: Pulmonary effort is normal. No respiratory distress.  Musculoskeletal:        General: Normal range of motion.     Cervical back: Normal range of motion.  Skin:    Coloration: Skin is not jaundiced or pale.  Neurological:     Mental Status: She is alert.     Comments: Oriented x 2   Psychiatric:        Attention and Perception: She is inattentive.        Mood and Affect: Affect normal. Mood is anxious.        Speech: She is noncommunicative. Speech is delayed.        Behavior: Behavior is withdrawn.        Thought Content: Thought content is paranoid and delusional.        Cognition and Memory: Cognition normal.        Judgment: Judgment is impulsive.    Review of Systems  Constitutional: Negative.   HENT:  Negative.    Eyes: Negative.   Respiratory: Negative.    Cardiovascular: Negative.   Musculoskeletal: Negative.   Skin: Negative.   Neurological: Negative.        Chronic eps   Psychiatric/Behavioral:  The patient is nervous/anxious.     Blood pressure 123/89, temperature 98.6 F (37 C), temperature source Oral, resp. rate 18, height '5\' 9"'$  (1.753 m), weight 237 lb (107.5 kg), SpO2 97 %. Body mass index is 35 kg/m.  Past Psychiatric History: schizophrenia  Is the patient at risk to self? No  Has the patient been a risk to self in the past 6 months? No .    Has the patient been a risk to self within the distant past? No   Is the patient a risk to others? No   Has the patient been a risk to others in the past 6 months? No   Has the patient been a risk to others within the distant past? No   Past Medical History:  Past Medical History:  Diagnosis Date   Arthritis    Asthma    Bipolar 1 disorder ([place])    Bronchitis    Depression    Hemorrhoid     Schizoaffective disorder ([place])    Schizophrenia ([place])    Pt denies and reports it is schizoaffective disorder.    Transfusion history     Past Surgical History:  Procedure Laterality Date   Arm surgery     CESAREAN SECTION     COLONOSCOPY     5-6 years ago per pt-near [place] normal exam per pt    Family History:  Family History  Problem Relation Age of Onset   Hypertension Mother    Hypertension Father    Diabetes type II Other    Stomach cancer Cousin    Colon cancer Neg Hx    Colon polyps Neg Hx    Esophageal cancer Neg Hx    Rectal cancer Neg Hx     Social History:  Social History   Socioeconomic History   Marital status: Single    Spouse name: Not on file   Number of children: Not on file   Years of education: Not on file   Highest education level: Not on file  Occupational History   Occupation: unemployed  Tobacco Use   Smoking status: Every Day    Packs/day: 0.50    Years: 17.00    Total pack years: 8.50    Types: Cigarettes   Smokeless tobacco: Never  Vaping Use   Vaping Use: Every day  Substance and Sexual Activity   Alcohol use: No   Drug use: Not Currently    Types: Marijuana    Comment: Former user   Sexual activity: Not Currently  Other Topics Concern   Not on file  Social History Narrative   ** Merged History Encounter **       Social Determinants of Health   Financial Resource Strain: Not on file  Food Insecurity: Not on file  Transportation Needs: Not on file  Physical Activity: Not on file  Stress: Not on file  Social Connections: Not on file  Intimate Partner Violence: Not on file    SDOH:  SDOH Screenings   Alcohol Screen: Low Risk  (11/28/2019)  Tobacco Use: High Risk (07/09/2021)    Last Labs:  Admission on 01/04/2022  Component Date Value Ref Range Status   SARSCOV2ONAVIRUS 2 AG 01/04/2022 NEGATIVE  NEGATIVE Final   Comment: (NOTE) SARS-CoV-2  antigen NOT DETECTED.   Negative results are presumptive.   Negative results do not preclude SARS-CoV-2 infection and should not be used as the sole basis for treatment or other patient management decisions, including infection  control decisions, particularly in the presence of clinical signs and  symptoms consistent with COVID-19, or in those who have been in contact with the virus.  Negative results must be combined with clinical observations, patient history, and epidemiological information. The expected result is Negative.  Fact Sheet for Patients: HandmadeRecipes.com.cy  Fact Sheet for Healthcare Providers: FuneralLife.at  This test is not yet approved or cleared by the [place] FDA and  has been authorized for detection and/or diagnosis of SARS-CoV-2 by FDA under an Emergency Use Authorization (EUA).  This EUA will remain in effect (meaning this test can be used) for the duration of  the COV                          ID-19 declaration under Section 564(b)(1) of the Act, 21 U.S.C. section 360bbb-3(b)(1), unless the authorization is terminated or revoked sooner.    Admission on 07/09/2021, Discharged on 07/09/2021  Component Date Value Ref Range Status   Sodium 07/09/2021 133 (L)  135 - 145 mmol/L Final   Potassium 07/09/2021 3.6  3.5 - 5.1 mmol/L Final   Chloride 07/09/2021 105  98 - 111 mmol/L Final   CO2 07/09/2021 20 (L)  22 - 32 mmol/L Final   Glucose, Bld 07/09/2021 158 (H)  70 - 99 mg/dL Final   Glucose reference range applies only to samples taken after fasting for at least 8 hours.   BUN 07/09/2021 18  6 - 20 mg/dL Final   Creatinine, Ser 07/09/2021 0.96  0.44 - 1.00 mg/dL Final   Calcium 07/09/2021 9.1  8.9 - 10.3 mg/dL Final   Total Protein 07/09/2021 9.0 (H)  6.5 - 8.1 g/dL Final   Albumin 07/09/2021 4.5  3.5 - 5.0 g/dL Final   AST 07/09/2021 25  15 - 41 U/L Final   ALT 07/09/2021 18  0 - 44 U/L Final   Alkaline Phosphatase 07/09/2021 74  38 - 126 U/L Final   Total  Bilirubin 07/09/2021 0.3  0.3 - 1.2 mg/dL Final   GFR, Estimated 07/09/2021 >60  >60 mL/min Final   Comment: (NOTE) Calculated using the CKD-EPI Creatinine Equation (2021)    Anion gap 07/09/2021 8  5 - 15 Final   Performed at [facility], [place] [address] [place], [place] [address]   Alcohol, Ethyl (B) 07/09/2021 <10  <10 mg/dL Final   Comment: (NOTE) Lowest detectable limit for serum alcohol is 10 mg/dL.  For medical purposes only. Performed at [facility], [place] [address], [place], [place] [address]    Salicylate Lvl 02/13/2535 <7.0 (L)  7.0 - 30.0 mg/dL Final   Performed at [place] [address], [place], [place] [address]   Acetaminophen (Tylenol), Serum 07/09/2021 <10 (L)  10 - 30 ug/mL Final   Comment: (NOTE) Therapeutic concentrations vary significantly. A range of 10-30 ug/mL  may be an effective concentration for many patients. However, some  are best treated at concentrations outside of this range. Acetaminophen concentrations >150 ug/mL at 4 hours after ingestion  and >50 ug/mL at 12 hours after ingestion are often associated with  toxic reactions.  Performed at [facility], [place] [address] [place], [place] [address]    WBC  07/09/2021 8.5  4.0 - 10.5 K/uL Final   RBC 07/09/2021 4.32  3.87 - 5.11 MIL/uL Final   Hemoglobin 07/09/2021 11.4 (L)  12.0 - 15.0 g/dL Final   HCT 07/09/2021 36.2  36.0 - 46.0 % Final   MCV 07/09/2021 83.8  80.0 - 100.0 fL Final   MCH 07/09/2021 26.4  26.0 - 34.0 pg Final   MCHC 07/09/2021 31.5  30.0 - 36.0 g/dL Final   RDW 07/09/2021 18.0 (H)  11.5 - 15.5 % Final   Platelets 07/09/2021 321  150 - 400 K/uL Final   nRBC 07/09/2021 0.0  0.0 - 0.2 % Final   Performed at [facility], [place] [address] [place], [place] [address]   Opiates 07/09/2021 NONE DETECTED  NONE DETECTED Final   Cocaine 07/09/2021 NONE DETECTED  NONE DETECTED Final    Benzodiazepines 07/09/2021 NONE DETECTED  NONE DETECTED Final   Amphetamines 07/09/2021 NONE DETECTED  NONE DETECTED Final   Tetrahydrocannabinol 07/09/2021 NONE DETECTED  NONE DETECTED Final   Barbiturates 07/09/2021 POSITIVE (A)  NONE DETECTED Final   Comment: (NOTE) DRUG SCREEN FOR MEDICAL PURPOSES ONLY.  IF CONFIRMATION IS NEEDED FOR ANY PURPOSE, NOTIFY LAB WITHIN 5 DAYS.  LOWEST DETECTABLE LIMITS FOR URINE DRUG SCREEN Drug Class                     Cutoff (ng/mL) Amphetamine and metabolites    1000 Barbiturate and metabolites    200 Benzodiazepine                 845 Tricyclics and metabolites     300 Opiates and metabolites        300 Cocaine and metabolites        300 THC                            50 Performed at [facility], [place] [address] [place], [place] [address]    I-stat hCG, quantitative 07/09/2021 <5.0  <5 mIU/mL Final   Comment 3 07/09/2021          Final   Comment:   GEST. AGE      CONC.  (mIU/mL)   <=1 WEEK        5 - 50     2 WEEKS       50 - 500     3 WEEKS       100 - 10,000     4 WEEKS     1,000 - 30,000        FEMALE AND NON-PREGNANT FEMALE:     LESS THAN 5 mIU/mL    SARS Coronavirus 2 by RT PCR 07/09/2021 NEGATIVE  NEGATIVE Final   Comment: (NOTE) SARS-CoV-2 target nucleic acids are NOT DETECTED.  The SARS-CoV-2 RNA is generally detectable in upper respiratory specimens during the acute phase of infection. The lowest concentration of SARS-CoV-2 viral copies this assay can detect is 138 copies/mL. A negative result does not preclude SARS-Cov-2 infection and should not be used as the sole basis for treatment or other patient management decisions. A negative result may occur with  improper specimen collection/handling, submission of specimen other than nasopharyngeal swab, presence of viral mutation(s) within the areas targeted by this assay, and inadequate number of viral copies(<138 copies/mL). A negative result must be  combined with clinical observations, patient history, and epidemiological information. The expected result is Negative.  Fact Sheet  for Patients:  EntrepreneurPulse.com.au  Fact Sheet for Healthcare Providers:  IncredibleEmployment.be  This test is no                          t yet approved or cleared by the [place] FDA and  has been authorized for detection and/or diagnosis of SARS-CoV-2 by FDA under an Emergency Use Authorization (EUA). This EUA will remain  in effect (meaning this test can be used) for the duration of the COVID-19 declaration under Section 564(b)(1) of the Act, 21 U.S.C.section 360bbb-3(b)(1), unless the authorization is terminated  or revoked sooner.       Influenza A by PCR 07/09/2021 NEGATIVE  NEGATIVE Final   Influenza B by PCR 07/09/2021 NEGATIVE  NEGATIVE Final   Comment: (NOTE) The Xpert Xpress SARS-CoV-2/FLU/RSV plus assay is intended as an aid in the diagnosis of influenza from Nasopharyngeal swab specimens and should not be used as a sole basis for treatment. Nasal washings and aspirates are unacceptable for Xpert Xpress SARS-CoV-2/FLU/RSV testing.  Fact Sheet for Patients: EntrepreneurPulse.com.au  Fact Sheet for Healthcare Providers: IncredibleEmployment.be  This test is not yet approved or cleared by the [place] FDA and has been authorized for detection and/or diagnosis of SARS-CoV-2 by FDA under an Emergency Use Authorization (EUA). This EUA will remain in effect (meaning this test can be used) for the duration of the COVID-19 declaration under Section 564(b)(1) of the Act, 21 U.S.C. section 360bbb-3(b)(1), unless the authorization is terminated or revoked.  Performed at [facility], [place] [address] [place], [place] [address]     Allergies: Anette Guarneri [lurasidone hcl], Abilify [aripiprazole], Flagyl [metronidazole hcl], Prolixin  [fluphenazine], Risperidone and related, Seroquel [quetiapine fumarate], and Sulfa antibiotics  PTA Medications: (Not in a hospital admission)   Medical Decision Making  Patient presents to [place].  She appears psychotic.  She meets criteria for inpatient psychiatric admission and involuntary commitment.     Recommendations  Based on my evaluation the patient does not appear to have an emergency medical condition.  Meets criteria for inpatient psychiatric admission.  [facility] notified.  Patient placed under involuntary commitment.  Patient's home medications restarted.   Meds ordered this encounter-this time patient is refusing  Medications   alum & mag hydroxide-simeth (MAALOX/MYLANTA) 200-200-20 MG/5ML suspension 30 mL   magnesium hydroxide (MILK OF MAGNESIA) suspension 30 mL   hydrOXYzine (ATARAX) tablet 25 mg 3 times daily as needed   amantadine (SYMMETREL) capsule 100 mg 3 times daily   ARIPiprazole Lauroxil ER (ARISTADA) 882 MG/3.2ML prefilled syringe 882 mg    Used sample medication MONTHLY   clonazePAM (KLONOPIN) tablet 2 mg nightly   gabapentin (NEURONTIN) capsule 300 mg 3 times daily   perphenazine (TRILAFON) tablet 16 mg QHS daily   primidone (MYSOLINE) tablet 100 mg 3 times daily   perphenazine (TRILAFON) tablet 8 mg once now   perphenazine (TRILAFON) tablet 8 mg daily in a.m.   perphenazine (TRILAFON) tablet 8 mg once at bedtime 01/04/2022   clonazePAM (KLONOPIN) tablet 1 mg one-time dose     Lab Orders -        Resp Panel by RT-PCR (Flu A&B, Covid) Anterior Nasal Swab         CBC with Differential/Platelet         Comprehensive metabolic panel         Hemoglobin A1c         Magnesium  Ethanol         Lipid panel         TSH         Urinalysis, Routine w reflex microscopic Urine, Clean Catch         POCT Urine Drug Screen - (I-Screen)         POC SARS Coronavirus 2 Ag-ED - Nasal Swab         POC SARS Coronavirus 2 Ag      EKG-  Revonda Humphrey, NP 01/04/22  11:20 AM
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
## 2022-01-04 NOTE — ED Notes (Signed)
Pt noted laughing in bed to self.  No others in area.

## 2022-01-04 NOTE — ED Notes (Signed)
Pt sleeping at present, no distress noted, monitoring for safety.  Pt is IVC.

## 2022-01-05 ENCOUNTER — Telehealth (HOSPITAL_COMMUNITY): Payer: Self-pay | Admitting: Internal Medicine

## 2022-01-05 NOTE — Progress Notes (Signed)
[name] remained visible in the millieu throughout the day with briefs naps. She is denying all of the psychiatric symptoms this afternoon and stated she needs to get some rest. She asked for sanitary pads several times today and took a shower earlier.

## 2022-01-05 NOTE — ED Notes (Signed)
Pt was offered breakfast,but refused. Will continue to monitor for safety.

## 2022-01-05 NOTE — Progress Notes (Signed)
Pt is under review at [facility]. CSW will assist and follow with placement.  Benjaman Kindler, MSW, LCSWA 01/05/2022 1:16 PM

## 2022-01-05 NOTE — Progress Notes (Signed)
This Probation officer finally convinced Ragan to take her medications after 15 minutes. She said she is trying to wean herself off of medications.

## 2022-01-05 NOTE — ED Notes (Signed)
Pt remains awake, no distress noted. Calm & cooperative.  Resistant to care.  Monitoring for safety.

## 2022-01-05 NOTE — Progress Notes (Signed)
Inpatient Behavioral Health Placement  Pt continues to meet inpatient criteria per Thomes Lolling, NP and Molli Barrows, FNP. There are no available beds at [facility] per [facility] Lynnda Shields, RN. CSW sent referral to out of network providers.  Destination Service Provider Address Phone Fax  [facility]  [place], [place] [address] [place]  [facility]  [address] [place] [address] [phone]  [facility]  [place], [place] [address] [phone]  [facility]  [address] [place] [address] [place]  [facility]  [address], [place] [address] [phone]  [place] [address] [place] [address] [phone]  [facility]  [address] [place] [address] [phone]  [facility]  [address], [place] [address] [phone] [facility]  [address] [place] [address] [phone] [place]  [address], [place] [address] [phone]  [facility]  [address] [place] [address] [place], MSW, [facility] 01/05/2022 12:58 PM

## 2022-01-05 NOTE — BH Assessment (Signed)
Care Management - Follow Up [facility] Discharges   Patient has been placed in an inpatient psychiatric hospital [facility]) on 01-05-2022

## 2022-01-05 NOTE — Progress Notes (Signed)
[name] was transported via Engineer, agricultural to [employer]. The required paperwork and her personal belongings was transported with her.

## 2022-01-05 NOTE — ED Provider Notes (Signed)
FBC/OBS ASAP Discharge Summary  Date and Time: 01/05/2022 3:18 PM  Name: [name]  MRN:  [id number]   Discharge Diagnoses:  Final diagnoses:  Schizoaffective disorder, unspecified type ([place])   [name], 51 y.o., female patient seen face to face by this provider, consulted with Dr. Leverne Humbles; and chart reviewed on 01/05/22.   Background: [name] 51 y.o., female patient with a history of Schizoaffective disorder, EPS and Akathisia presented initially voluntarily to [facility] escorted by [employer] after calling 911 due to being afraid on 01/04/22. She was evaluated by the NP upon arrival and was found to exhibiting paranoid, delusion consisting of "the evil people came and got me and held me hostage for 3 years until today". Throughout the exam on yesterday, patient was responding to both external and internal stimuli, refusing medications, refusing labs, although agreeable to admission initially. After being placed on the continuous assessment unit, patient requested to be discharge and provider found patient to appear to be psychotic and was placed under IVC, while awaiting inpatient behavioral health placement.  Subjective:   [name], 51 yo, female, under IVC petition seen today face-to-face on the continuous assessment unit following admission 12/04/21. She is followed by an ACT team and under the care of Dr. Sheppard Evens. Patient is awake, standing at her bedside initially during visit and subsequent, agreed to sit on her bed. Patient is actively rocking back and forth during evaluation today. She states "Do you see those ants crawling all over the floor and walls". This Probation officer inspected the room and area in which patient describes seeing ants and no ants were visible. When asked if patient was taking her medication today she responded no by nodding her head, then stated "I'm trying to wean myself off". Patient denies thoughts of self-harm. Reports she is not eating although,  visible open beverages and food package were present on the chair beside her bed. Patient is only oriented to place as she states" I am in the hospital". [name] was unable to recall the reason she was admitted here at [facility].   During evaluation [name] is standing/sitting, and rocking back and forth without acute distress. She is alert, oriented to place only and is unable to verbalize the month, season, year or president. She is pleasant and cooperative. Her mood anxious is with flat affect.  Speech and tone are normal.  Patient continues to exhibit both visual hallucinations and paranoia. Patient denies any SI/ HI.   Summary of Stay:  [name] home medications were continued which refused to take until this morning. Staff was unable to obtain blood work or UDS as patient refused both. She agreed to COVID testing which resulted as negative. Medications, which were tolerated with no adverse reactions.  [name] was recommended for inpatient stay due to her current crisis and was excepted at [facility]. She will require GPS transport as she is under continuous IVC petition.     Total Time spent with patient: 20 minutes   Past Medical History:  Past Medical History:  Diagnosis Date   Arthritis    Asthma    Bipolar 1 disorder ([place])    Bronchitis    Depression    Hemorrhoid    Schizoaffective disorder ([place])    Schizophrenia ([place])    Pt denies and reports it is schizoaffective disorder.    Transfusion history     Past Surgical History:  Procedure Laterality Date   Arm surgery  CESAREAN SECTION     COLONOSCOPY     5-6 years ago per pt-near [place] normal exam per pt   Family History:  Family History  Problem Relation Age of Onset   Hypertension Mother    Hypertension Father    Diabetes type II Other    Stomach cancer Cousin    Colon cancer Neg Hx    Colon polyps Neg Hx    Esophageal cancer Neg Hx    Rectal cancer Neg Hx    Family  Psychiatric History:  Social History:  Social History   Substance and Sexual Activity  Alcohol Use No     Social History   Substance and Sexual Activity  Drug Use Not Currently   Types: Marijuana   Comment: Former user    Social History   Socioeconomic History   Marital status: Single    Spouse name: Not on file   Number of children: Not on file   Years of education: Not on file   Highest education level: Not on file  Occupational History   Occupation: unemployed  Tobacco Use   Smoking status: Every Day    Packs/day: 0.50    Years: 17.00    Total pack years: 8.50    Types: Cigarettes   Smokeless tobacco: Never  Vaping Use   Vaping Use: Every day  Substance and Sexual Activity   Alcohol use: No   Drug use: Not Currently    Types: Marijuana    Comment: Former user   Sexual activity: Not Currently  Other Topics Concern   Not on file  Social History Narrative   ** Merged History Encounter **       Social Determinants of Health   Financial Resource Strain: Not on file  Food Insecurity: Not on file  Transportation Needs: Not on file  Physical Activity: Not on file  Stress: Not on file  Social Connections: Not on file   SDOH:  SDOH Screenings   Alcohol Screen: Low Risk  (11/28/2019)  Depression (PHQ2-9): High Risk (01/04/2022)  Tobacco Use: High Risk (01/04/2022)    Tobacco Cessation:  Prescription not provided because: patient is transferring to higher level of care   Current Medications:  Current Facility-Administered Medications  Medication Dose Route Frequency Provider Last Rate Last Admin   alum & mag hydroxide-simeth (MAALOX/MYLANTA) 200-200-20 MG/5ML suspension 30 mL  30 mL Oral Q4H PRN Revonda Humphrey, NP       amantadine (SYMMETREL) capsule 100 mg  100 mg Oral TID Revonda Humphrey, NP   100 mg at 01/05/22 0916   ARIPiprazole Lauroxil ER (ARISTADA) 882 MG/3.2ML prefilled syringe 882 mg  882 mg Intramuscular Q30 days Revonda Humphrey, NP        clonazePAM Bobbye [place]) tablet 1 mg  1 mg Oral Once Revonda Humphrey, NP       clonazePAM (KLONOPIN) tablet 2 mg  2 mg Oral QHS Revonda Humphrey, NP       gabapentin (NEURONTIN) capsule 300 mg  300 mg Oral TID Revonda Humphrey, NP   300 mg at 01/05/22 0916   hydrOXYzine (ATARAX) tablet 25 mg  25 mg Oral TID PRN Revonda Humphrey, NP       OLANZapine zydis (ZYPREXA) disintegrating tablet 5 mg  5 mg Oral Q8H PRN Revonda Humphrey, NP       And   LORazepam (ATIVAN) tablet 1 mg  1 mg Oral PRN Revonda Humphrey, NP  And   ziprasidone (GEODON) injection 20 mg  20 mg Intramuscular PRN Revonda Humphrey, NP       magnesium hydroxide (MILK OF MAGNESIA) suspension 30 mL  30 mL Oral Daily PRN Revonda Humphrey, NP       perphenazine (TRILAFON) tablet 16 mg  16 mg Oral QHS Thomes Lolling H, NP       perphenazine (TRILAFON) tablet 8 mg  8 mg Oral Daily Thomes Lolling H, NP   8 mg at 01/05/22 5170   perphenazine (TRILAFON) tablet 8 mg  8 mg Oral Once Revonda Humphrey, NP       primidone (MYSOLINE) tablet 100 mg  100 mg Oral TID Revonda Humphrey, NP   100 mg at 01/05/22 0174   Current Outpatient Medications  Medication Sig Dispense Refill   amantadine (SYMMETREL) 100 MG capsule Take 100 mg by mouth 3 (three) times daily.     ARIPiprazole Lauroxil ER (ARISTADA) 882 MG/3.2ML prefilled syringe Inject 882 mg into the muscle every 30 (thirty) days.     clonazePAM (KLONOPIN) 1 MG tablet Take 2 mg by mouth at bedtime.     gabapentin (NEURONTIN) 300 MG capsule Take 300 mg by mouth 3 (three) times daily.     perphenazine (TRILAFON) 8 MG tablet Take 16 mg by mouth at bedtime.     primidone (MYSOLINE) 50 MG tablet Take 100 mg by mouth 3 (three) times daily.      PTA Medications: (Not in a hospital admission)      01/04/2022   11:22 AM  Depression screen PHQ 2/9  Decreased Interest 2  Down, Depressed, Hopeless 1  PHQ - 2 Score 3  Altered sleeping 1  Tired, decreased energy 1  Change in  appetite 1  Feeling bad or failure about yourself  1  Trouble concentrating 2  Moving slowly or fidgety/restless 3  Suicidal thoughts 0  PHQ-9 Score 12  Difficult doing work/chores Somewhat difficult    Flowsheet Row ED from 07/09/2021 in [facility] Most recent reading at 07/09/2021  7:50 AM ED from 04/03/2021 in [facility] Most recent reading at 04/05/2021  1:08 AM ED from 04/03/2021 in [facility] Most recent reading at 04/03/2021  7:47 AM  C-SSRS RISK CATEGORY No Risk No Risk No Risk       Psychiatric Specialty Exam  Presentation  General Appearance: Appropriate for Environment  Eye Contact:Fair  Speech:Clear and Coherent  Speech Volume:Normal  Handedness:Right   Mood and Affect  Mood:Anxious  Affect:Flat   Thought Process  Thought Processes:Disorganized  Descriptions of Associations:Loose  Orientation:Partial  Thought Content:Delusions; Paranoid Ideation  Diagnosis of Schizophrenia or Schizoaffective disorder in past: Yes  Duration of Psychotic Symptoms: Greater than six months   Hallucinations:Hallucinations: Visual Description of Visual Hallucinations: ants crawling on the wall  Ideas of Reference:Delusions; Paranoia  Suicidal Thoughts:Suicidal Thoughts: No  Homicidal Thoughts:Homicidal Thoughts: No   Sensorium  Memory:Immediate Poor; Remote Poor; Recent Poor  Judgment:Impaired  Insight:Poor   Executive Functions  Concentration:Poor  Attention Span:Poor  Recall:Poor  Fund of Knowledge:Poor  Language:Poor   Psychomotor Activity  Psychomotor Activity:Psychomotor Activity: Extrapyramidal Side Effects (EPS); Restlessness Extrapyramidal Side Effects (EPS): Akathisia AIMS Completed?: No   Assets  Assets:Housing; Financial Resources/Insurance; Desire for Improvement   Sleep  Sleep:Sleep: -- (patient unable to provide quality of  sleep) Number of Hours of Sleep: 0 (patient unable to verbalize number of hours sleep)   Nutritional Assessment (  For OBS and [facility] admissions only) Has the patient had a weight loss or gain of 10 pounds or more in the last 3 months?: -- (unknown) Has the patient had a decrease in food intake/or appetite?: Yes Does the patient have dental problems?: No Does the patient have eating habits or behaviors that may be indicators of an eating disorder including binging or inducing vomiting?: No Has the patient recently lost weight without trying?: 2.0 Has the patient been eating poorly because of a decreased appetite?: 1 Malnutrition Screening Tool Score: 3    Physical Exam  Physical Exam ROS Blood pressure (!) 158/91, pulse 84, temperature 98.7 F (37.1 C), temperature source Oral, resp. rate 18, height '5\' 9"'$  (1.753 m), weight 237 lb (107.5 kg), SpO2 99 %. Body mass index is 35 kg/m.  Demographic Factors:  Living alone  Loss Factors: Psychiatric chronic condition limits social interactions  Historical Factors: Prior inpatient admissions due to psychosis and suicidal ideations    Risk Reduction Factors:   NA  Continued Clinical Symptoms:  Schizophrenia:   Paranoid or undifferentiated type  Cognitive Features That Contribute To Risk:  Thought constriction (tunnel vision)    Suicide Risk:  Minimal: No identifiable suicidal ideation.  Patients presenting with no risk factors but with morbid ruminations; may be classified as minimal risk based on the severity of the depressive symptoms  Plan Of Care/Follow-up recommendations:  Other:  Patient is being transferred to a higher level of care. Patient is medically stable. EMTALA completed.     Disposition: Transferred to [place], via [place], [place] 01/05/2022, 3:18 PM

## 2022-01-05 NOTE — ED Notes (Signed)
Pt remains awake, no distress noted.  Monitoring for safety.

## 2022-01-05 NOTE — Progress Notes (Signed)
Pt was accepted to [facility] 01/05/22; Bed Assignment Althia Forts A  Pt meets inpatient criteria per Molli Barrows, FNP  Attending Physician will be Dr. Alcide Clever  Report can be called to: [phone]  Pt can arrive: ANYTIME  Care Team notified: Roxy Manns, RN, Molli Barrows, FNP, and Drema Halon, RN  Nadara Mode, [place] 01/05/2022 @ 3:11 PM

## 2022-01-05 NOTE — Progress Notes (Signed)
Received [name] this AM awake in the flex area on her bed. She refused breakfast, but later asked for juice. She was informed a urine specimen and blood work needs to be done.

## 2022-01-05 NOTE — ED Provider Notes (Signed)
Behavioral Health Progress Note  Date and Time: 01/05/2022 9:42 AM Name: [name] MRN:  [id number] Schizoaffective disorder, unspecified type [facility])   Background: [name] 51 y.o., female patient with a history of Schizoaffective disorder, EPS and Akathisia presented initially voluntarily to [facility] escorted by [facility] after calling 911 due to being afraid on 01/04/22. She was evaluated by the NP upon arrival and was found to exhibiting paranoid, delusion consisting of "the evil people came and got me and held me hostage for 3 years until today". Throughout the exam on yesterday, patient was responding to both external and internal stimuli, refusing medications, refusing labs, although agreeable to admission initially. After being placed on the continuous assessment unit, patient requested to be discharge and provider found patient to appear to be psychotic and was placed under IVC, while awaiting inpatient behavioral health placement.  Subjective: [name], 51 y.o., female patient seen face to face by this provider, consulted with Dr. Leverne Humbles; and chart reviewed on 01/05/22.   [name], 51 yo, female, seen today face-to-face on the continuous assessment unit following admission 12/04/21. She is followed by an ACT team and under the care of Dr. Sheppard Evens. Patient is awake, standing at her bedside initially during visit and subsequent, agreed to sit on her bed. Patient is actively rocking back and forth during evaluation today. She states "Do you see those ants crawling all over the floor and walls". This Probation officer inspected the room and area in which patient describes seeing ants and no ants were visible. When asked if patient was taking her medication today she responded no by nodding her head, then stated "I'm trying to wean myself off". Patient denies thoughts of self-harm. Reports she is not eating although, visible open beverages and food package were present on the chair  beside her bed. Patient is only oriented to place as she states" I am in the hospital". [name] was unable to recall the reason she was admitted here at [facility].  During evaluation [name] is standing/sitting, and rocking back and forth without acute distress. She is alert, oriented to place only and is unable to verbalize the month, season, year or president. She is pleasant and cooperative. Her mood anxious is with flat affect.  Speech and tone are normal.  Patient continues to exhibit both visual hallucinations and paranoia. She denies any SI/ HI.   Total Time spent with patient: 20 minutes  Past Psychiatric History: Most recent: Schizoaffective disorder w/suicidal ideations inpatient admission 09/18/2021-[facility]    Past Medical History:  Past Medical History:  Diagnosis Date   Arthritis    Asthma    Bipolar 1 disorder ([place])    Bronchitis    Depression    Hemorrhoid    Schizoaffective disorder ([place])    Schizophrenia ([place])    Pt denies and reports it is schizoaffective disorder.    Transfusion history     Past Surgical History:  Procedure Laterality Date   Arm surgery     CESAREAN SECTION     COLONOSCOPY     5-6 years ago per pt-near [place] normal exam per pt   Family History:  Family History  Problem Relation Age of Onset   Hypertension Mother    Hypertension Father    Diabetes type II Other    Stomach cancer Cousin    Colon cancer Neg Hx    Colon polyps Neg Hx    Esophageal cancer Neg Hx  Rectal cancer Neg Hx    Family Psychiatric  History: Unable to obtain information from patient.  Social History:  Social History   Substance and Sexual Activity  Alcohol Use No     Social History   Substance and Sexual Activity  Drug Use Not Currently   Types: Marijuana   Comment: Former user    Social History   Socioeconomic History   Marital status: Single    Spouse name: Not on file   Number of children: Not on file   Years of education: Not on  file   Highest education level: Not on file  Occupational History   Occupation: unemployed  Tobacco Use   Smoking status: Every Day    Packs/day: 0.50    Years: 17.00    Total pack years: 8.50    Types: Cigarettes   Smokeless tobacco: Never  Vaping Use   Vaping Use: Every day  Substance and Sexual Activity   Alcohol use: No   Drug use: Not Currently    Types: Marijuana    Comment: Former user   Sexual activity: Not Currently  Other Topics Concern   Not on file  Social History Narrative   ** Merged History Encounter **       Social Determinants of Health   Financial Resource Strain: Not on file  Food Insecurity: Not on file  Transportation Needs: Not on file  Physical Activity: Not on file  Stress: Not on file  Social Connections: Not on file   SDOH:  SDOH Screenings   Alcohol Screen: Low Risk  (11/28/2019)  Depression (PHQ2-9): High Risk (01/04/2022)  Tobacco Use: High Risk (01/04/2022)   Additional Social History:    Pain Medications: See MAR Prescriptions: See MAR Over the Counter: See MAR History of alcohol / drug use?: No history of alcohol / drug abuse Longest period of sobriety (when/how long): Pt denies SA Negative Consequences of Use:  (NA) Withdrawal Symptoms:  (NA)                   Sleep: Fair  Appetite:   Observed food and drink at bedside, patient denies eating.   Current Medications:  Current Facility-Administered Medications  Medication Dose Route Frequency Provider Last Rate Last Admin   alum & mag hydroxide-simeth (MAALOX/MYLANTA) 200-200-20 MG/5ML suspension 30 mL  30 mL Oral Q4H PRN Revonda Humphrey, NP       amantadine (SYMMETREL) capsule 100 mg  100 mg Oral TID Revonda Humphrey, NP   100 mg at 01/05/22 0916   ARIPiprazole Lauroxil ER (ARISTADA) 882 MG/3.2ML prefilled syringe 882 mg  882 mg Intramuscular Q30 days Revonda Humphrey, NP       clonazePAM Bobbye [place]) tablet 1 mg  1 mg Oral Once Revonda Humphrey, NP        clonazePAM Bobbye [place]) tablet 2 mg  2 mg Oral QHS Revonda Humphrey, NP       gabapentin (NEURONTIN) capsule 300 mg  300 mg Oral TID Revonda Humphrey, NP   300 mg at 01/05/22 0916   hydrOXYzine (ATARAX) tablet 25 mg  25 mg Oral TID PRN Revonda Humphrey, NP       OLANZapine zydis (ZYPREXA) disintegrating tablet 5 mg  5 mg Oral Q8H PRN Revonda Humphrey, NP       And   LORazepam (ATIVAN) tablet 1 mg  1 mg Oral PRN Revonda Humphrey, NP       And   ziprasidone (GEODON)  injection 20 mg  20 mg Intramuscular PRN Revonda Humphrey, NP       magnesium hydroxide (MILK OF MAGNESIA) suspension 30 mL  30 mL Oral Daily PRN Revonda Humphrey, NP       perphenazine (TRILAFON) tablet 16 mg  16 mg Oral QHS Thomes Lolling H, NP       perphenazine (TRILAFON) tablet 8 mg  8 mg Oral Daily Thomes Lolling H, NP   8 mg at 01/05/22 7989   perphenazine (TRILAFON) tablet 8 mg  8 mg Oral Once Revonda Humphrey, NP       primidone (MYSOLINE) tablet 100 mg  100 mg Oral TID Revonda Humphrey, NP   100 mg at 01/05/22 2119   Current Outpatient Medications  Medication Sig Dispense Refill   amantadine (SYMMETREL) 100 MG capsule Take 100 mg by mouth 3 (three) times daily.     ARIPiprazole Lauroxil ER (ARISTADA) 882 MG/3.2ML prefilled syringe Inject 882 mg into the muscle every 30 (thirty) days.     clonazePAM (KLONOPIN) 1 MG tablet Take 2 mg by mouth at bedtime.     gabapentin (NEURONTIN) 300 MG capsule Take 300 mg by mouth 3 (three) times daily.     perphenazine (TRILAFON) 8 MG tablet Take 16 mg by mouth at bedtime.     primidone (MYSOLINE) 50 MG tablet Take 100 mg by mouth 3 (three) times daily.      Labs  Lab Results:  Admission on 01/04/2022  Component Date Value Ref Range Status   SARS Coronavirus 2 by RT PCR 01/04/2022 NEGATIVE  NEGATIVE Final   Comment: (NOTE) SARS-CoV-2 target nucleic acids are NOT DETECTED.  The SARS-CoV-2 RNA is generally detectable in upper respiratory specimens during the  acute phase of infection. The lowest concentration of SARS-CoV-2 viral copies this assay can detect is 138 copies/mL. A negative result does not preclude SARS-Cov-2 infection and should not be used as the sole basis for treatment or other patient management decisions. A negative result may occur with  improper specimen collection/handling, submission of specimen other than nasopharyngeal swab, presence of viral mutation(s) within the areas targeted by this assay, and inadequate number of viral copies(<138 copies/mL). A negative result must be combined with clinical observations, patient history, and epidemiological information. The expected result is Negative.  Fact Sheet for Patients:  EntrepreneurPulse.com.au  Fact Sheet for Healthcare Providers:  IncredibleEmployment.be  This test is no                          t yet approved or cleared by the [place] FDA and  has been authorized for detection and/or diagnosis of SARS-CoV-2 by FDA under an Emergency Use Authorization (EUA). This EUA will remain  in effect (meaning this test can be used) for the duration of the COVID-19 declaration under Section 564(b)(1) of the Act, 21 U.S.C.section 360bbb-3(b)(1), unless the authorization is terminated  or revoked sooner.       Influenza A by PCR 01/04/2022 NEGATIVE  NEGATIVE Final   Influenza B by PCR 01/04/2022 NEGATIVE  NEGATIVE Final   Comment: (NOTE) The Xpert Xpress SARS-CoV-2/FLU/RSV plus assay is intended as an aid in the diagnosis of influenza from Nasopharyngeal swab specimens and should not be used as a sole basis for treatment. Nasal washings and aspirates are unacceptable for Xpert Xpress SARS-CoV-2/FLU/RSV testing.  Fact Sheet for Patients: EntrepreneurPulse.com.au  Fact Sheet for Healthcare Providers: IncredibleEmployment.be  This test is not yet approved  or cleared by the [place]  and has been authorized for detection and/or diagnosis of SARS-CoV-2 by FDA under an Emergency Use Authorization (EUA). This EUA will remain in effect (meaning this test can be used) for the duration of the COVID-19 declaration under Section 564(b)(1) of the Act, 21 U.S.C. section 360bbb-3(b)(1), unless the authorization is terminated or revoked.  Performed at [facility], [place] [address], [place], [place] [address]    SARSCOV2ONAVIRUS 2 AG 01/04/2022 NEGATIVE  NEGATIVE Final   Comment: (NOTE) SARS-CoV-2 antigen NOT DETECTED.   Negative results are presumptive.  Negative results do not preclude SARS-CoV-2 infection and should not be used as the sole basis for treatment or other patient management decisions, including infection  control decisions, particularly in the presence of clinical signs and  symptoms consistent with COVID-19, or in those who have been in contact with the virus.  Negative results must be combined with clinical observations, patient history, and epidemiological information. The expected result is Negative.  Fact Sheet for Patients: HandmadeRecipes.com.cy  Fact Sheet for Healthcare Providers: FuneralLife.at  This test is not yet approved or cleared by the [place] FDA and  has been authorized for detection and/or diagnosis of SARS-CoV-2 by FDA under an Emergency Use Authorization (EUA).  This EUA will remain in effect (meaning this test can be used) for the duration of  the COV                          ID-19 declaration under Section 564(b)(1) of the Act, 21 U.S.C. section 360bbb-3(b)(1), unless the authorization is terminated or revoked sooner.    Admission on 07/09/2021, Discharged on 07/09/2021  Component Date Value Ref Range Status   Sodium 07/09/2021 133 (L)  135 - 145 mmol/L Final   Potassium 07/09/2021 3.6  3.5 - 5.1 mmol/L Final   Chloride 07/09/2021 105  98 - 111 mmol/L Final   CO2  07/09/2021 20 (L)  22 - 32 mmol/L Final   Glucose, Bld 07/09/2021 158 (H)  70 - 99 mg/dL Final   Glucose reference range applies only to samples taken after fasting for at least 8 hours.   BUN 07/09/2021 18  6 - 20 mg/dL Final   Creatinine, Ser 07/09/2021 0.96  0.44 - 1.00 mg/dL Final   Calcium 07/09/2021 9.1  8.9 - 10.3 mg/dL Final   Total Protein 07/09/2021 9.0 (H)  6.5 - 8.1 g/dL Final   Albumin 07/09/2021 4.5  3.5 - 5.0 g/dL Final   AST 07/09/2021 25  15 - 41 U/L Final   ALT 07/09/2021 18  0 - 44 U/L Final   Alkaline Phosphatase 07/09/2021 74  38 - 126 U/L Final   Total Bilirubin 07/09/2021 0.3  0.3 - 1.2 mg/dL Final   GFR, Estimated 07/09/2021 >60  >60 mL/min Final   Comment: (NOTE) Calculated using the CKD-EPI Creatinine Equation (2021)    Anion gap 07/09/2021 8  5 - 15 Final   Performed at [facility], [place] [address] [place], [place] [address]   Alcohol, Ethyl (B) 07/09/2021 <10  <10 mg/dL Final   Comment: (NOTE) Lowest detectable limit for serum alcohol is 10 mg/dL.  For medical purposes only. Performed at [facility], [place] [address], [place], [place] [address]    Salicylate Lvl 61/95/0932 <7.0 (L)  7.0 - 30.0 mg/dL Final   Performed at [place] [address] [place], [place] [address]  Acetaminophen (Tylenol), Serum 07/09/2021 <10 (L)  10 - 30 ug/mL Final   Comment: (NOTE) Therapeutic concentrations vary significantly. A range of 10-30 ug/mL  may be an effective concentration for many patients. However, some  are best treated at concentrations outside of this range. Acetaminophen concentrations >150 ug/mL at 4 hours after ingestion  and >50 ug/mL at 12 hours after ingestion are often associated with  toxic reactions.  Performed at [facility], [place] [address] [place], [place] [address]    WBC 07/09/2021 8.5  4.0 - 10.5 K/uL Final   RBC 07/09/2021 4.32  3.87 - 5.11 MIL/uL Final    Hemoglobin 07/09/2021 11.4 (L)  12.0 - 15.0 g/dL Final   HCT 07/09/2021 36.2  36.0 - 46.0 % Final   MCV 07/09/2021 83.8  80.0 - 100.0 fL Final   MCH 07/09/2021 26.4  26.0 - 34.0 pg Final   MCHC 07/09/2021 31.5  30.0 - 36.0 g/dL Final   RDW 07/09/2021 18.0 (H)  11.5 - 15.5 % Final   Platelets 07/09/2021 321  150 - 400 K/uL Final   nRBC 07/09/2021 0.0  0.0 - 0.2 % Final   Performed at [facility], [place] [address], [place], [place] [address]   Opiates 07/09/2021 NONE DETECTED  NONE DETECTED Final   Cocaine 07/09/2021 NONE DETECTED  NONE DETECTED Final   Benzodiazepines 07/09/2021 NONE DETECTED  NONE DETECTED Final   Amphetamines 07/09/2021 NONE DETECTED  NONE DETECTED Final   Tetrahydrocannabinol 07/09/2021 NONE DETECTED  NONE DETECTED Final   Barbiturates 07/09/2021 POSITIVE (A)  NONE DETECTED Final   Comment: (NOTE) DRUG SCREEN FOR MEDICAL PURPOSES ONLY.  IF CONFIRMATION IS NEEDED FOR ANY PURPOSE, NOTIFY LAB WITHIN 5 DAYS.  LOWEST DETECTABLE LIMITS FOR URINE DRUG SCREEN Drug Class                     Cutoff (ng/mL) Amphetamine and metabolites    1000 Barbiturate and metabolites    200 Benzodiazepine                 601 Tricyclics and metabolites     300 Opiates and metabolites        300 Cocaine and metabolites        300 THC                            50 Performed at [facility], [name] [address] [place],  [address]    I-stat hCG, quantitative 07/09/2021 <5.0  <5 mIU/mL Final   Comment 3 07/09/2021          Final   Comment:   GEST. AGE      CONC.  (mIU/mL)   <=1 WEEK        5 - 50     2 WEEKS       50 - 500     3 WEEKS       100 - 10,000     4 WEEKS     1,000 - 30,000        FEMALE AND NON-PREGNANT FEMALE:     LESS THAN 5 mIU/mL    SARS Coronavirus 2 by RT PCR 07/09/2021 NEGATIVE  NEGATIVE Final   Comment: (NOTE) SARS-CoV-2 target nucleic acids are NOT DETECTED.  The SARS-CoV-2 RNA is generally detectable in upper  respiratory specimens during the acute phase of infection. The lowest concentration of SARS-CoV-2 viral  copies this assay can detect is 138 copies/mL. A negative result does not preclude SARS-Cov-2 infection and should not be used as the sole basis for treatment or other patient management decisions. A negative result may occur with  improper specimen collection/handling, submission of specimen other than nasopharyngeal swab, presence of viral mutation(s) within the areas targeted by this assay, and inadequate number of viral copies(<138 copies/mL). A negative result must be combined with clinical observations, patient history, and epidemiological information. The expected result is Negative.  Fact Sheet for Patients:  EntrepreneurPulse.com.au  Fact Sheet for Healthcare Providers:  IncredibleEmployment.be  This test is no                          t yet approved or cleared by the [place] FDA and  has been authorized for detection and/or diagnosis of SARS-CoV-2 by FDA under an Emergency Use Authorization (EUA). This EUA will remain  in effect (meaning this test can be used) for the duration of the COVID-19 declaration under Section 564(b)(1) of the Act, 21 U.S.C.section 360bbb-3(b)(1), unless the authorization is terminated  or revoked sooner.       Influenza A by PCR 07/09/2021 NEGATIVE  NEGATIVE Final   Influenza B by PCR 07/09/2021 NEGATIVE  NEGATIVE Final   Comment: (NOTE) The Xpert Xpress SARS-CoV-2/FLU/RSV plus assay is intended as an aid in the diagnosis of influenza from Nasopharyngeal swab specimens and should not be used as a sole basis for treatment. Nasal washings and aspirates are unacceptable for Xpert Xpress SARS-CoV-2/FLU/RSV testing.  Fact Sheet for Patients: EntrepreneurPulse.com.au  Fact Sheet for Healthcare Providers: IncredibleEmployment.be  This test is not yet approved or  cleared by the [place] FDA and has been authorized for detection and/or diagnosis of SARS-CoV-2 by FDA under an Emergency Use Authorization (EUA). This EUA will remain in effect (meaning this test can be used) for the duration of the COVID-19 declaration under Section 564(b)(1) of the Act, 21 U.S.C. section 360bbb-3(b)(1), unless the authorization is terminated or revoked.  Performed at [facility], [place] [address] [place], [place] [address]     Blood Alcohol level:  Lab Results  Component Value Date   [facility] <10 07/09/2021   ETH <10 02/02/5101    Metabolic Disorder Labs: Lab Results  Component Value Date   HGBA1C 6.3 (H) 01/30/2017   MPG 134.11 01/30/2017   MPG 128 05/11/2015   No results found for: "PROLACTIN" Lab Results  Component Value Date   CHOL 193 11/27/2019   TRIG 75 11/27/2019   HDL 54 11/27/2019   CHOLHDL 3.6 11/27/2019   VLDL 15 11/27/2019   LDLCALC 124 (H) 11/27/2019   LDLCALC 93 01/29/2017    Therapeutic Lab Levels: No results found for: "LITHIUM" No results found for: "VALPROATE" Lab Results  Component Value Date   CBMZ 2.9 (L) 11/29/2019   CBMZ 12.8 (H) 11/13/2018    Physical Findings   AIMS    Flowsheet Row Admission (Discharged) from 11/28/2019 in [place] [facility] Admission (Discharged) from 02/06/2019 in [place] [facility] Admission (Discharged) from OP Visit from 11/08/2018 in [place] [facility] Admission (Discharged) from 01/28/2017 in [place] Admission (Discharged) from 05/10/2015 in [place] [facility]  AIMS Total Score 5 0 0 0 0      AUDIT    Flowsheet Row Admission (Discharged) from 11/28/2019 in [place]  [facility] Admission (Discharged) from 02/06/2019 in [place] [facility] Admission (Discharged) from OP Visit from 11/08/2018  in [place] [facility] Admission (Discharged) from 01/28/2017 in [place] Admission (Discharged) from 05/10/2015 in [place] [facility]  Alcohol Use Disorder Identification Test Final Score (AUDIT) 0 0 0 0 0      PHQ2-9    Flowsheet Row ED from 01/04/2022 in [facility]  PHQ-2 Total Score 3  PHQ-9 Total Score 12      Flowsheet Row ED from 07/09/2021 in [place] DEPT Most recent reading at 07/09/2021  7:50 AM ED from 04/03/2021 in [place] DEPT Most recent reading at 04/05/2021  1:08 AM ED from 04/03/2021 in [place] DEPT Most recent reading at 04/03/2021  7:47 AM  C-SSRS RISK CATEGORY No Risk No Risk No Risk         Psychiatric Specialty Exam  Presentation  General Appearance: Disheveled  Eye Contact:Fleeting  Speech:Blocked; Slow  Speech Volume:Normal  Handedness:Right   Mood and Affect  Mood:Anxious  Affect:Congruent; Tearful   Thought Process  Thought Processes:Coherent  Descriptions of Associations:Intact  Orientation:Partial  Thought Content:Delusions; Paranoid Ideation  Diagnosis of Schizophrenia or Schizoaffective disorder in past: Yes  Duration of Psychotic Symptoms: Greater than six months   Hallucinations:Hallucinations: None  Ideas of Reference:None  Suicidal Thoughts:Suicidal Thoughts: No  Homicidal Thoughts:Homicidal Thoughts: No   Sensorium  Memory:Immediate Poor; Recent Poor; Remote Poor  Judgment:Poor  Insight:Poor   Executive Functions  Concentration:Poor  Attention Span:Poor  Recall:Poor  Fund of Knowledge:Poor  Language:Poor   Psychomotor Activity  Psychomotor Activity:Psychomotor Activity: Extrapyramidal Side Effects (EPS) Extrapyramidal Side Effects (EPS): Akathisia AIMS Completed?: No   Assets  Assets:Physical  Health; Resilience   Sleep  Sleep:Sleep: Poor Number of Hours of Sleep: 0   Nutritional Assessment (For OBS and FBC admissions only) Has the patient had a weight loss or gain of 10 pounds or more in the last 3 months?: No Has the patient had a decrease in food intake/or appetite?: Yes Does the patient have dental problems?: No Does the patient have eating habits or behaviors that may be indicators of an eating disorder including binging or inducing vomiting?: No Has the patient recently lost weight without trying?: 2.0 Has the patient been eating poorly because of a decreased appetite?: 1 Malnutrition Screening Tool Score: 3    Physical Exam  Physical Exam Constitutional:      Appearance: Normal appearance.  Eyes:     Extraocular Movements: Extraocular movements intact.     Pupils: Pupils are equal, round, and reactive to light.  Skin:    General: Skin is warm and dry.  Neurological:     Mental Status: She is alert. Mental status is at baseline.    Review of Systems  Reason unable to perform ROS: Patient unable to provide information due to current mental health condition.   Blood pressure (!) 158/91, pulse 84, temperature 98.7 F (37.1 C), temperature source Oral, resp. rate 18, height '5\' 9"'$  (1.753 m), weight 237 lb (107.5 kg), SpO2 99 %. Body mass index is 35 kg/m.  Treatment Plan Summary: [facility] was admitted to [facility] Continuous Assessment unit for paranoia, psychosis, secondary to schizoaffective disorder. She remains mental unstable. Patient did agree to finally take morning medications although continues to refuse labs. Patient case review and discussed with Dr. Leverne Humbles, and patient meets inpatient criteria  for inpatient psychiatric treatment. CSW faxed inpatient bed request , 01/04/22.    Molli Barrows, FNP 01/05/2022 9:42 AM

## 2022-01-05 NOTE — ED Notes (Signed)
Pt provided with orange juice upon request. Will continue to monitor for safety.

## 2022-07-12 ENCOUNTER — Inpatient Hospital Stay (HOSPITAL_BASED_OUTPATIENT_CLINIC_OR_DEPARTMENT_OTHER)
Admission: EM | Admit: 2022-07-12 | Discharge: 2022-07-14 | DRG: 291 | Disposition: A | Discharge status: Home / Self Care | Payer: Medicaid Other | Attending: Internal Medicine | Admitting: Internal Medicine

## 2022-07-12 ENCOUNTER — Other Ambulatory Visit: Payer: Self-pay

## 2022-07-12 DIAGNOSIS — I5031 Acute diastolic (congestive) heart failure: Secondary | ICD-10-CM | POA: Diagnosis present

## 2022-07-12 DIAGNOSIS — G4733 Obstructive sleep apnea (adult) (pediatric): Secondary | ICD-10-CM | POA: Diagnosis present

## 2022-07-12 DIAGNOSIS — Z6841 Body Mass Index (BMI) 40.0 and over, adult: Secondary | ICD-10-CM

## 2022-07-12 DIAGNOSIS — Z882 Allergy status to sulfonamides status: Secondary | ICD-10-CM

## 2022-07-12 DIAGNOSIS — J209 Acute bronchitis, unspecified: Secondary | ICD-10-CM | POA: Diagnosis present

## 2022-07-12 DIAGNOSIS — I517 Cardiomegaly: Secondary | ICD-10-CM | POA: Diagnosis present

## 2022-07-12 DIAGNOSIS — Z79899 Other long term (current) drug therapy: Secondary | ICD-10-CM

## 2022-07-12 DIAGNOSIS — I110 Hypertensive heart disease with heart failure: Principal | ICD-10-CM | POA: Diagnosis present

## 2022-07-12 DIAGNOSIS — I160 Hypertensive urgency: Secondary | ICD-10-CM | POA: Diagnosis present

## 2022-07-12 DIAGNOSIS — M1990 Unspecified osteoarthritis, unspecified site: Secondary | ICD-10-CM | POA: Diagnosis present

## 2022-07-12 DIAGNOSIS — D509 Iron deficiency anemia, unspecified: Secondary | ICD-10-CM | POA: Diagnosis present

## 2022-07-12 DIAGNOSIS — J4520 Mild intermittent asthma, uncomplicated: Secondary | ICD-10-CM | POA: Diagnosis present

## 2022-07-12 DIAGNOSIS — R0602 Shortness of breath: Principal | ICD-10-CM

## 2022-07-12 DIAGNOSIS — E66813 Obesity, class 3: Secondary | ICD-10-CM | POA: Diagnosis present

## 2022-07-12 DIAGNOSIS — R7989 Other specified abnormal findings of blood chemistry: Secondary | ICD-10-CM | POA: Diagnosis present

## 2022-07-12 DIAGNOSIS — I10 Essential (primary) hypertension: Secondary | ICD-10-CM

## 2022-07-12 DIAGNOSIS — Z8249 Family history of ischemic heart disease and other diseases of the circulatory system: Secondary | ICD-10-CM

## 2022-07-12 DIAGNOSIS — Z91148 Patient's other noncompliance with medication regimen for other reason: Secondary | ICD-10-CM

## 2022-07-12 DIAGNOSIS — F17210 Nicotine dependence, cigarettes, uncomplicated: Secondary | ICD-10-CM | POA: Diagnosis present

## 2022-07-12 DIAGNOSIS — F250 Schizoaffective disorder, bipolar type: Secondary | ICD-10-CM | POA: Diagnosis present

## 2022-07-12 DIAGNOSIS — Z881 Allergy status to other antibiotic agents status: Secondary | ICD-10-CM

## 2022-07-12 DIAGNOSIS — Z888 Allergy status to other drugs, medicaments and biological substances status: Secondary | ICD-10-CM

## 2022-07-12 DIAGNOSIS — R0600 Dyspnea, unspecified: Secondary | ICD-10-CM | POA: Diagnosis present

## 2022-07-12 NOTE — ED Triage Notes (Signed)
Pt arrives via [facility] from home ambulatory to room. C/O SOB ongoing since yesterday. Associated with back pain. No relief from inhaler. No chest pain. No cough. BP 182/86, endorses noncompliance with medications.

## 2022-07-13 ENCOUNTER — Inpatient Hospital Stay (HOSPITAL_COMMUNITY): Payer: Medicaid Other

## 2022-07-13 ENCOUNTER — Emergency Department (HOSPITAL_BASED_OUTPATIENT_CLINIC_OR_DEPARTMENT_OTHER): Payer: Medicaid Other

## 2022-07-13 DIAGNOSIS — I5031 Acute diastolic (congestive) heart failure: Secondary | ICD-10-CM | POA: Diagnosis present

## 2022-07-13 DIAGNOSIS — Z882 Allergy status to sulfonamides status: Secondary | ICD-10-CM | POA: Diagnosis not present

## 2022-07-13 DIAGNOSIS — J4520 Mild intermittent asthma, uncomplicated: Secondary | ICD-10-CM | POA: Diagnosis present

## 2022-07-13 DIAGNOSIS — D509 Iron deficiency anemia, unspecified: Secondary | ICD-10-CM

## 2022-07-13 DIAGNOSIS — R0600 Dyspnea, unspecified: Secondary | ICD-10-CM | POA: Diagnosis not present

## 2022-07-13 DIAGNOSIS — M1990 Unspecified osteoarthritis, unspecified site: Secondary | ICD-10-CM | POA: Diagnosis present

## 2022-07-13 DIAGNOSIS — R7989 Other specified abnormal findings of blood chemistry: Secondary | ICD-10-CM | POA: Diagnosis not present

## 2022-07-13 DIAGNOSIS — F17210 Nicotine dependence, cigarettes, uncomplicated: Secondary | ICD-10-CM | POA: Diagnosis present

## 2022-07-13 DIAGNOSIS — Z6841 Body Mass Index (BMI) 40.0 and over, adult: Secondary | ICD-10-CM | POA: Diagnosis not present

## 2022-07-13 DIAGNOSIS — Z91148 Patient's other noncompliance with medication regimen for other reason: Secondary | ICD-10-CM | POA: Diagnosis not present

## 2022-07-13 DIAGNOSIS — R0609 Other forms of dyspnea: Secondary | ICD-10-CM

## 2022-07-13 DIAGNOSIS — I10 Essential (primary) hypertension: Secondary | ICD-10-CM | POA: Diagnosis not present

## 2022-07-13 DIAGNOSIS — Z888 Allergy status to other drugs, medicaments and biological substances status: Secondary | ICD-10-CM | POA: Diagnosis not present

## 2022-07-13 DIAGNOSIS — R0602 Shortness of breath: Secondary | ICD-10-CM | POA: Diagnosis present

## 2022-07-13 DIAGNOSIS — Z79899 Other long term (current) drug therapy: Secondary | ICD-10-CM | POA: Diagnosis not present

## 2022-07-13 DIAGNOSIS — Z8249 Family history of ischemic heart disease and other diseases of the circulatory system: Secondary | ICD-10-CM | POA: Diagnosis not present

## 2022-07-13 DIAGNOSIS — Z881 Allergy status to other antibiotic agents status: Secondary | ICD-10-CM | POA: Diagnosis not present

## 2022-07-13 DIAGNOSIS — I517 Cardiomegaly: Secondary | ICD-10-CM

## 2022-07-13 DIAGNOSIS — F250 Schizoaffective disorder, bipolar type: Secondary | ICD-10-CM | POA: Diagnosis present

## 2022-07-13 DIAGNOSIS — I110 Hypertensive heart disease with heart failure: Secondary | ICD-10-CM | POA: Diagnosis present

## 2022-07-13 DIAGNOSIS — J209 Acute bronchitis, unspecified: Secondary | ICD-10-CM | POA: Diagnosis present

## 2022-07-13 DIAGNOSIS — I160 Hypertensive urgency: Secondary | ICD-10-CM | POA: Diagnosis present

## 2022-07-13 LAB — CBC WITH DIFFERENTIAL/PLATELET
Abs Immature Granulocytes: 0.02 10*3/uL (ref 0.00–0.07)
Basophils Absolute: 0.1 10*3/uL (ref 0.0–0.1)
Basophils Relative: 1 %
Eosinophils Absolute: 0.1 10*3/uL (ref 0.0–0.5)
Eosinophils Relative: 1 %
HCT: 30.8 % — ABNORMAL LOW (ref 36.0–46.0)
Hemoglobin: 9.8 g/dL — ABNORMAL LOW (ref 12.0–15.0)
Immature Granulocytes: 0 %
Lymphocytes Relative: 31 %
Lymphs Abs: 3 10*3/uL (ref 0.7–4.0)
MCH: 25.9 pg — ABNORMAL LOW (ref 26.0–34.0)
MCHC: 31.8 g/dL (ref 30.0–36.0)
MCV: 81.5 fL (ref 80.0–100.0)
Monocytes Absolute: 0.3 10*3/uL (ref 0.1–1.0)
Monocytes Relative: 4 %
Neutro Abs: 6 10*3/uL (ref 1.7–7.7)
Neutrophils Relative %: 63 %
Platelets: 300 10*3/uL (ref 150–400)
RBC: 3.78 MIL/uL — ABNORMAL LOW (ref 3.87–5.11)
RDW: 17.2 % — ABNORMAL HIGH (ref 11.5–15.5)
WBC: 9.5 10*3/uL (ref 4.0–10.5)
nRBC: 0 % (ref 0.0–0.2)

## 2022-07-13 LAB — TSH: TSH: 4.814 u[IU]/mL — ABNORMAL HIGH (ref 0.350–4.500)

## 2022-07-13 LAB — BASIC METABOLIC PANEL
Anion gap: 13 (ref 5–15)
BUN: 10 mg/dL (ref 6–20)
CO2: 20 mmol/L — ABNORMAL LOW (ref 22–32)
Calcium: 9.6 mg/dL (ref 8.9–10.3)
Chloride: 104 mmol/L (ref 98–111)
Creatinine, Ser: 0.86 mg/dL (ref 0.44–1.00)
GFR, Estimated: >60 mL/min (ref >60)
Glucose, Bld: 98 mg/dL (ref 70–99)
Potassium: 3.5 mmol/L (ref 3.5–5.1)
Sodium: 137 mmol/L (ref 135–145)

## 2022-07-13 LAB — TROPONIN I (HIGH SENSITIVITY)
Troponin I (High Sensitivity): 16 ng/L (ref <18)
Troponin I (High Sensitivity): 18 ng/L — ABNORMAL HIGH (ref <18)

## 2022-07-13 LAB — IRON AND TIBC
Iron: 22 ug/dL — ABNORMAL LOW (ref 28–170)
Saturation Ratios: 6 % — ABNORMAL LOW (ref 10.4–31.8)
TIBC: 402 ug/dL (ref 250–450)
UIBC: 380 ug/dL

## 2022-07-13 LAB — D-DIMER, QUANTITATIVE: D-Dimer, Quant: 0.49 ug/mL-FEU (ref 0.00–0.50)

## 2022-07-13 LAB — HIV ANTIBODY (ROUTINE TESTING W REFLEX): HIV Screen 4th Generation wRfx: NONREACTIVE

## 2022-07-13 LAB — BRAIN NATRIURETIC PEPTIDE: B Natriuretic Peptide: 16.4 pg/mL (ref 0.0–100.0)

## 2022-07-13 LAB — FERRITIN: Ferritin: 13 ng/mL (ref 11–307)

## 2022-07-13 LAB — ECHOCARDIOGRAM COMPLETE
Area-P 1/2: 3.02 cm2
Height: 65 in
S' Lateral: 3.2 cm
Weight: 4532.66 oz

## 2022-07-13 LAB — T4, FREE: Free T4: 0.83 ng/dL (ref 0.61–1.12)

## 2022-07-13 LAB — MRSA NEXT GEN BY PCR, NASAL: MRSA by PCR Next Gen: NOT DETECTED

## 2022-07-13 LAB — HEMOGLOBIN AND HEMATOCRIT, BLOOD
HCT: 29.6 % — ABNORMAL LOW (ref 36.0–46.0)
Hemoglobin: 9.5 g/dL — ABNORMAL LOW (ref 12.0–15.0)

## 2022-07-13 MED ORDER — POTASSIUM CHLORIDE CRYS ER 20 MEQ PO TBCR
40.0000 meq | EXTENDED_RELEASE_TABLET | ORAL | Status: AC
  Administered 2022-07-13: 40 meq via ORAL
  Filled 2022-07-13: qty 2

## 2022-07-13 MED ORDER — ENOXAPARIN SODIUM 40 MG/0.4ML IJ SOSY
40.0000 mg | PREFILLED_SYRINGE | INTRAMUSCULAR | Status: DC
  Administered 2022-07-13: 40 mg via SUBCUTANEOUS
  Filled 2022-07-13: qty 0.4

## 2022-07-13 MED ORDER — VENLAFAXINE HCL ER 37.5 MG PO CP24
37.5000 mg | ORAL_CAPSULE | Freq: Every day | ORAL | Status: DC
  Filled 2022-07-13 (×2): qty 1

## 2022-07-13 MED ORDER — ONDANSETRON HCL 4 MG/2ML IJ SOLN: 4.0000 mg | Freq: Four times a day (QID) | INTRAMUSCULAR | Status: DC | PRN

## 2022-07-13 MED ORDER — SODIUM CHLORIDE 0.9% FLUSH
3.0000 mL | Freq: Two times a day (BID) | INTRAVENOUS | Status: DC
  Administered 2022-07-13 – 2022-07-14 (×3): 3 mL via INTRAVENOUS

## 2022-07-13 MED ORDER — ACETAMINOPHEN 325 MG PO TABS: 650.0000 mg | ORAL_TABLET | Freq: Four times a day (QID) | ORAL | Status: DC | PRN

## 2022-07-13 MED ORDER — FUROSEMIDE 10 MG/ML IJ SOLN
20.0000 mg | Freq: Once | INTRAMUSCULAR | Status: AC
  Administered 2022-07-13: 20 mg via INTRAVENOUS
  Filled 2022-07-13: qty 2

## 2022-07-13 MED ORDER — ALBUTEROL SULFATE (2.5 MG/3ML) 0.083% IN NEBU: 2.5000 mg | INHALATION_SOLUTION | Freq: Four times a day (QID) | RESPIRATORY_TRACT | Status: DC | PRN

## 2022-07-13 MED ORDER — OLANZAPINE 10 MG PO TABS
10.0000 mg | ORAL_TABLET | Freq: Two times a day (BID) | ORAL | Status: DC
  Administered 2022-07-14: 10 mg via ORAL
  Filled 2022-07-13 (×3): qty 1

## 2022-07-13 MED ORDER — PRIMIDONE 50 MG PO TABS
100.0000 mg | ORAL_TABLET | Freq: Three times a day (TID) | ORAL | Status: DC
  Filled 2022-07-13 (×5): qty 2

## 2022-07-13 MED ORDER — ONDANSETRON HCL 4 MG PO TABS: 4.0000 mg | ORAL_TABLET | Freq: Four times a day (QID) | ORAL | Status: DC | PRN

## 2022-07-13 MED ORDER — IOHEXOL 350 MG/ML SOLN
75.0000 mL | Freq: Once | INTRAVENOUS | Status: AC | PRN
  Administered 2022-07-13: 75 mL via INTRAVENOUS

## 2022-07-13 MED ORDER — ACETAMINOPHEN 650 MG RE SUPP: 650.0000 mg | Freq: Four times a day (QID) | RECTAL | Status: DC | PRN

## 2022-07-13 MED ORDER — HYDRALAZINE HCL 20 MG/ML IJ SOLN: 10.0000 mg | INTRAMUSCULAR | Status: DC | PRN

## 2022-07-13 NOTE — Progress Notes (Signed)
Hospitalist Transfer Note:  Transferring facility: [facility] Requesting provider: Dr. Dina Rich (EDP at [facility]) Reason for transfer: admission for further evaluation and management of suspected new dx of chf.    52 y.o.  female w/ mild intermittent asthma, essential hypertension, but no known h/o chf, who presented to [facility] ED complaining of shortness of breath over the last few days associated with orthopnea.  No chest pain.  Most recent echocardiogram occurred in November 2012, with normal EF at that time.  Not on any Lasix as an outpatient.  She knowledges a history of hypertension, will also conveyed suboptimal compliance with her outpatient BP medications.  Vital signs in the ED were notable for the following: Initial blood pressure elevated, systolic values in the [id number], socially improving into the 140s following initiation of IV diuresis via Lasix 20 mg IV x 1.  Labs were notable for initial troponin of 16, with repeat value trending up slightly to 18.  Creatinine nonelevated.  EKG reportedly nonischemic.  Imaging notable for chest x-ray which reportedly showed evidence of cardiomegaly, without additional acute cardiopulmonary process.  Medications administered prior to transfer included the following: Lasix 20 mg IV x 1 dose.  Subsequently, I accepted this patient for transfer for inpatient admission to a cardiac telemetry bed at [facility] for further work-up and management of the above .       Nursing staff, Please call [place] number on Amion [phone]) as soon as patient's arrival, so appropriate admitting provider can evaluate the pt.     Babs Bertin, DO Hospitalist

## 2022-07-13 NOTE — ED Notes (Signed)
Called [facility] to transport patient to [place] rm# 3

## 2022-07-13 NOTE — Progress Notes (Signed)
Echocardiogram 2D Echocardiogram has been performed.  Oneal Deputy Solymar Grace RDCS 07/13/2022, 4:08 PM

## 2022-07-13 NOTE — ED Notes (Signed)
Report called to [place], nurse on accepting unit at [facility].

## 2022-07-13 NOTE — ED Notes (Signed)
Pt declined IV start, phlebotomy performed.

## 2022-07-13 NOTE — ED Provider Notes (Signed)
[facility] Provider Note   CSN: [id number] Arrival date & time: 07/12/22  2344     History  Chief Complaint  Patient presents with   Shortness of Breath    [name] is a 52 y.o. female.  HPI    This is a 52 year old female with a history of asthma who presents with shortness of breath.  Patient reports she has had worsening shortness of breath over the last 1 to 2 days.  She states that normally she has difficulty sleeping on her back secondary to shortness of breath; however, she cannot find any comfortable sleeping position and woke up multiple times secondary to shortness of breath.  She feels like she is wheezing but her inhaler is not helping.  She has not had any chest pain.  Has not noted any significant lower extremity swelling.  No recent fevers or cough. Home Medications Prior to Admission medications   Medication Sig Start Date End Date Taking? Authorizing Provider  amantadine (SYMMETREL) 100 MG capsule Take 100 mg by mouth 3 (three) times daily.    [provider]  ARIPiprazole Lauroxil ER (ARISTADA) 882 MG/3.2ML prefilled syringe Inject 882 mg into the muscle every 30 (thirty) days.    [provider]  clonazePAM (KLONOPIN) 1 MG tablet Take 2 mg by mouth at bedtime.    [provider]  gabapentin (NEURONTIN) 300 MG capsule Take 300 mg by mouth 3 (three) times daily.    [provider]  perphenazine (TRILAFON) 8 MG tablet Take 16 mg by mouth at bedtime.    [provider]  primidone (MYSOLINE) 50 MG tablet Take 100 mg by mouth 3 (three) times daily.    [provider]      Allergies    Anette Guarneri [lurasidone hcl], Abilify [aripiprazole], Flagyl [metronidazole hcl], Prolixin [fluphenazine], Risperidone and related, Seroquel [quetiapine fumarate], and Sulfa antibiotics    Review of Systems   Review of Systems  Constitutional:  Negative for fever.  Respiratory:   Positive for shortness of breath. Negative for cough.   Cardiovascular:  Negative for leg swelling.  All other systems reviewed and are negative.   Physical Exam Updated Vital Signs BP (!) 144/60   Pulse 65   Temp 98.1 F (36.7 C) (Oral)   Resp 14   SpO2 98%  Physical Exam Vitals and nursing note reviewed.  Constitutional:      Appearance: She is well-developed.     Comments: Morbidly obese, nontoxic  HENT:     Head: Normocephalic and atraumatic.  Eyes:     Pupils: Pupils are equal, round, and reactive to light.  Cardiovascular:     Rate and Rhythm: Normal rate and regular rhythm.     Heart sounds: Normal heart sounds.  Pulmonary:     Effort: Pulmonary effort is normal. No respiratory distress.     Breath sounds: No wheezing.     Comments: No respiratory distress, distant breath sounds but limited secondary to body habitus Abdominal:     General: Bowel sounds are normal.     Palpations: Abdomen is soft.  Musculoskeletal:     Cervical back: Neck supple.     Right lower leg: Edema present.     Left lower leg: Edema present.     Comments: Trace bilateral lower extremity edema  Skin:    General: Skin is warm and dry.  Neurological:     Mental Status: She is alert and oriented to person, place,  and time.  Psychiatric:        Mood and Affect: Mood normal.     ED Results / Procedures / Treatments   Labs (all labs ordered are listed, but only abnormal results are displayed) Labs Reviewed  CBC WITH DIFFERENTIAL/PLATELET - Abnormal; Notable for the following components:      Result Value   RBC 3.78 (*)    Hemoglobin 9.8 (*)    HCT 30.8 (*)    MCH 25.9 (*)    RDW 17.2 (*)    All other components within normal limits  BASIC METABOLIC PANEL - Abnormal; Notable for the following components:   CO2 20 (*)    All other components within normal limits  TROPONIN I (HIGH SENSITIVITY) - Abnormal; Notable for the following components:   Troponin I (High Sensitivity) 18 (*)     All other components within normal limits  BRAIN NATRIURETIC PEPTIDE  TROPONIN I (HIGH SENSITIVITY)    EKG EKG Interpretation  Date/Time:  Tuesday July 13 2022 00:01:45 EDT Ventricular Rate:  66 PR Interval:  161 QRS Duration: 94 QT Interval:  408 QTC Calculation: 428 R Axis:   59 Text Interpretation: Sinus rhythm Low voltage, precordial leads Confirmed by Thayer Jew [phone]) on 07/13/2022 1:12:15 AM  Radiology DG Chest Portable 1 View  Result Date: 07/13/2022 CLINICAL DATA:  Shortness of breath ongoing since yesterday. Back pain. Elevated blood pressure. EXAM: PORTABLE CHEST 1 VIEW COMPARISON:  08/29/2020 FINDINGS: Shallow inspiration. Cardiac enlargement. No vascular congestion, edema, or consolidation. No pleural effusions. No pneumothorax. Mediastinal contours appear intact. IMPRESSION: Cardiac enlargement.  Lungs are clear. Electronically Signed   By: Lucienne Capers M.D.   On: 07/13/2022 00:14    Procedures .Critical Care  Performed by: Merryl Hacker, MD Authorized by: Merryl Hacker, MD   Critical care provider statement:    Critical care time (minutes):  31   Critical care was necessary to treat or prevent imminent or life-threatening deterioration of the following conditions:  Cardiac failure   Critical care was time spent personally by me on the following activities:  Development of treatment plan with patient or surrogate, discussions with consultants, evaluation of patient's response to treatment, examination of patient, ordering and review of laboratory studies, ordering and review of radiographic studies, ordering and performing treatments and interventions, pulse oximetry, re-evaluation of patient's condition and review of old charts     Medications Ordered in ED Medications  furosemide (LASIX) injection 20 mg (20 mg Intravenous Given 07/13/22 0146)    ED Course/ Medical Decision Making/ A&P                             Medical Decision  Making Amount and/or Complexity of Data Reviewed Labs: ordered. Radiology: ordered.  Risk Prescription drug management. Decision regarding hospitalization.   This patient presents to the ED for concern of shortness of breath, this involves an extensive number of treatment options, and is a complaint that carries with it a high risk of complications and morbidity.  I considered the following differential and admission for this acute, potentially life threatening condition.  The differential diagnosis includes asthma, CHF, pneumonia, viral illness, PE, hypertensive urgency, hypertensive emergency  MDM:    This is a 52 year old female who presents with progressive shortness of breath.  Vital signs notable for blood pressure 185/86.  History of hypertension but reports noncompliance with medications.  Unsure of what medicines that she may take.  She does not appear overtly volume overloaded although given that her symptoms are worse at night and when laying flat, question whether she may have some pulmonary edema or CHF.  Her last echocardiogram was in 2012 with a normal EF.  She not having any active chest pain.  EKG is nonischemic.  Initial troponin 16, second troponin 18 which puts her slightly in the abnormal range.  She has a normal renal function.  Chest x-ray does not show overt pulmonary edema but does show some cardiomegaly.  Otherwise labs are notable for chronic anemia with a hemoglobin of 9.  BNP is 16.  Patient was given 20 mg of IV Lasix as she is nave.  Doubt NSTEMI although feel it is reasonable that she be admitted for cardiology evaluation and repeat echocardiogram given cardiomegaly and slightly elevated troponin.  This could also be related to her blood pressure although this downtrended without intervention to 144/60.  (Labs, imaging, consults)  Labs: I Ordered, and personally interpreted labs.  The pertinent results include: CBC, BMP, BNP, troponin  Imaging Studies ordered: I  ordered imaging studies including chest x-ray I independently visualized and interpreted imaging. I agree with the radiologist interpretation  Additional history obtained from chart review.  External records from outside source obtained and reviewed including prior evaluations  Cardiac Monitoring: The patient was maintained on a cardiac monitor.  If on the cardiac monitor, I personally viewed and interpreted the cardiac monitored which showed an underlying rhythm of: Sinus rhythm  Reevaluation: After the interventions noted above, I reevaluated the patient and found that they have :stayed the same  Social Determinants of Health:  lives independently  Disposition: Discharge  Co morbidities that complicate the patient evaluation  Past Medical History:  Diagnosis Date   Arthritis    Asthma    Bipolar 1 disorder ([place])    Bronchitis    Depression    Hemorrhoid    Schizoaffective disorder ([place])    Schizophrenia ([place])    Pt denies and reports it is schizoaffective disorder.    Transfusion history      Medicines Meds ordered this encounter  Medications   furosemide (LASIX) injection 20 mg    I have reviewed the patients home medicines and have made adjustments as needed  Problem List / ED Course: Problem List Items Addressed This Visit   None Visit Diagnoses     SOB (shortness of breath)    -  Primary   Elevated troponin                       Final Clinical Impression(s) / ED Diagnoses Final diagnoses:  SOB (shortness of breath)  Elevated troponin    Rx / DC Orders ED Discharge Orders     None         Merryl Hacker, MD 07/13/22 [phone]

## 2022-07-13 NOTE — H&P (Signed)
History and Physical    Patient: [name] [id number] DOB: 18-Oct-1970 DOA: 07/12/2022 DOS: the patient was seen and examined on 07/13/2022 PCP: Benito Mccreedy, MD  Patient coming from: Home via EMS  Chief Complaint:  Chief Complaint  Patient presents with   Shortness of Breath   HPI: [name] is a 52 y.o. female with medical history significant of hypertension, asthma, bipolar disorder, schizoaffective disorder, and tobacco abuse who presents with complaints of shortness of breath over the last 2 days.  Stated that it felt like she was unable to catch a full breath.  Noted associated symptoms of discomfort across her back, mild cough, and wheezing.  She had tried using her home inhaler without improvement in symptoms.  At baseline patient is not on oxygen.  Denied having any fever, leg swelling, calf pain, chest pain, or prolonged periods of sitting.   In the emergency department patient was noted to be afebrile with mild tachypnea, blood pressures elevated up to 185/86, and O2 saturations maintained on room air.  Labs significant for hemoglobin 9.8, high-sensitivity troponin 16 -> 18, and BNP 16.4.  EKG without significant ischemic changes.  Chest x-ray noted cardiac enlargement with clear lungs.  Patient had been given Lasix 20 mg IV.  Following the medications patient reported going to the bathroom several times and feeling little lightheaded.  Review of Systems: As mentioned in the history of present illness. All other systems reviewed and are negative. Past Medical History:  Diagnosis Date   Arthritis    Asthma    Bipolar 1 disorder ([place])    Bronchitis    Depression    Hemorrhoid    Schizoaffective disorder ([place])    Schizophrenia ([place])    Pt denies and reports it is schizoaffective disorder.    Transfusion history    Past Surgical History:  Procedure Laterality Date   Arm surgery     CESAREAN SECTION     COLONOSCOPY     [facility] per [facility]   Social History:  reports that she has been smoking cigarettes. She has a 8.50 pack-year smoking history. She has never used smokeless tobacco. She reports that she does not currently use drugs after having used the following drugs: Marijuana. She reports that she does not drink alcohol.  Allergies  Allergen Reactions   Latuda [Lurasidone Hcl] Nausea And Vomiting and Other (See Comments)    Shaking    Abilify [Aripiprazole] Other (See Comments)    shaking   Flagyl [Metronidazole Hcl] Nausea And Vomiting   Prolixin [Fluphenazine] Other (See Comments)    shaking   Risperidone And Related Other (See Comments)    shaking   Seroquel [Quetiapine Fumarate] Other (See Comments)    Patient states she had a syncopal episode after taking medication.    Sulfa Antibiotics Nausea And Vomiting    Family History  Problem Relation Age of Onset   Hypertension Mother    Hypertension Father    Diabetes type II Other    Stomach cancer Cousin    Colon cancer Neg Hx    Colon polyps Neg Hx    Esophageal cancer Neg Hx    Rectal cancer Neg Hx     Prior to Admission medications   Medication Sig Start Date End Date Taking? Authorizing Provider  amantadine (SYMMETREL) 100 MG capsule Take 100 mg by mouth 3 (three) times daily.    [provider]  ARIPiprazole Lauroxil ER (ARISTADA)  882 MG/3.2ML prefilled syringe Inject 882 mg into the muscle every 30 (thirty) days.    [provider]  clonazePAM (KLONOPIN) 1 MG tablet Take 2 mg by mouth at bedtime.    [provider]  gabapentin (NEURONTIN) 300 MG capsule Take 300 mg by mouth 3 (three) times daily.    [provider]  perphenazine (TRILAFON) 8 MG tablet Take 16 mg by mouth at bedtime.    [provider]  primidone (MYSOLINE) 50 MG tablet Take 100 mg by mouth 3 (three) times daily.    [provider]    Physical Exam: Vitals:   07/13/22 0445 07/13/22 0500 07/13/22 0605  07/13/22 0712  BP: 133/77  (!) 160/74 (!) 152/81  Pulse: 64  (!) 59 61  Resp: 15  15 14   Temp:   98.4 F (36.9 C) (!) 97.4 F (36.3 C)  TempSrc:   Oral Oral  SpO2: 99%  95% 94%  Weight:  128.5 kg    Height:  5\' 5"  (1.651 m)      Constitutional: Morbidly obese middle-aged female currently no acute distress Eyes: PERRL, lids and conjunctivae normal ENMT: Mucous membranes are moist.   Neck: normal, supple  Respiratory: Normal respiratory effort without significant wheezes or rhonchi appreciated at this time.  O2 saturation maintained on room air and she able to talk in complete sentences. Cardiovascular: Bradycardic.  No extremity edema. 2+ pedal pulses.   Abdomen: no tenderness, no masses palpated. Bowel sounds positive.  Musculoskeletal: no clubbing / cyanosis. No joint deformity upper and lower extremities. Good ROM, no contractures.   Skin: no rashes, lesions, ulcers. No induration Neurologic: CN 2-12 grossly intact.  Strength 5/5 in all 4.  Psychiatric: Normal judgment and insight. Alert and oriented x 3.  Flat affect.  Data Reviewed:  EKG revealed normal sinus rhythm with low voltage at 66 bpm.  Reviewed labs, imaging, and pertinent records as noted above in HPI Assessment and Plan:  Dyspnea Acute.  Patient presents with complaints of shortness of breath over the last 2 days and complaints of upper back discomfort.  O2 saturation maintained on room air.  On physical exam patient without any lower extremity edema. -Admit to a cardiac telemetry bed -Nasal cannula oxygen as needed to maintain O2 saturation greater than 90% -Check D-dimer -Check CT angiogram of the chest   -Albuterol nebs as needed for shortness of breath/wheezing  Cardiomegaly Acute.  BNP was 16.4.  Last echocardiogram noted EF of 60-65% with mild LVH and no significant regional wall motion abnormalities back in 2012. There was concern for patient to possibly fluid overloaded and she had been given Lasix 20 mg  IV.  -Strict intake and output -Daily weights -Check TSH -Check echocardiogram -Formally consult cardiology, if echo abnormal  Elevated troponin Acute.  Patient denies any complaints of chest pain.  High-sensitivity troponin 16->18.  EKG without significant ischemic changes for which symptoms thought to likely be secondary to demand. -Continue to monitor  Hypochromic anemia/iron deficiency anemia On admission hemoglobin noted to be 9.8 with MCH 25.9 and RDW elevated at 17.2.  Baseline hemoglobin previously appeared to range from 9-11.  Patient with a prior history of external hemorrhoids from prior colonoscopy in 2007. -Check iron studies and repeat H&H  Bipolar disorder Schizoaffective disorder Home medication regimen includes Pristiq 25 mg daily, Zyprexa 10 mg twice twice daily, and Mysoline 100 mg mg 3 times daily verified with the crisis hotline at [id number] -Continue home regimen with pharmacy substitutions  as needed  Morbid obesity  BMI 47.14 kg/m.  Suspect some aspect of sleep apnea is  DVT prophylaxis: Lovenox Advance Care Planning:   Code Status: Full Code    Consults: None  Family Communication: None  Severity of Illness: The appropriate patient status for this patient is INPATIENT. Inpatient status is judged to be reasonable and necessary in order to provide the required intensity of service to ensure the patient's safety. The patient's presenting symptoms, physical exam findings, and initial radiographic and laboratory data in the context of their chronic comorbidities is felt to place them at high risk for further clinical deterioration. Furthermore, it is not anticipated that the patient will be medically stable for discharge from the hospital within 2 midnights of admission.   * I certify that at the point of admission it is my clinical judgment that the patient will require inpatient hospital care spanning beyond 2 midnights from the point of admission due to high  intensity of service, high risk for further deterioration and high frequency of surveillance required.*  Author: Norval Morton, MD 07/13/2022 7:17 AM  For on call review [email or web address].

## 2022-07-13 NOTE — ED Notes (Signed)
Report given to [facility] via phone 

## 2022-07-14 ENCOUNTER — Other Ambulatory Visit (HOSPITAL_COMMUNITY): Payer: Self-pay

## 2022-07-14 DIAGNOSIS — I517 Cardiomegaly: Secondary | ICD-10-CM | POA: Diagnosis not present

## 2022-07-14 DIAGNOSIS — R7989 Other specified abnormal findings of blood chemistry: Secondary | ICD-10-CM | POA: Diagnosis not present

## 2022-07-14 DIAGNOSIS — R0602 Shortness of breath: Secondary | ICD-10-CM | POA: Diagnosis not present

## 2022-07-14 DIAGNOSIS — I10 Essential (primary) hypertension: Secondary | ICD-10-CM | POA: Diagnosis not present

## 2022-07-14 LAB — BASIC METABOLIC PANEL
Anion gap: 8 (ref 5–15)
BUN: 9 mg/dL (ref 6–20)
CO2: 24 mmol/L (ref 22–32)
Calcium: 9 mg/dL (ref 8.9–10.3)
Chloride: 103 mmol/L (ref 98–111)
Creatinine, Ser: 0.89 mg/dL (ref 0.44–1.00)
GFR, Estimated: >60 mL/min (ref >60)
Glucose, Bld: 105 mg/dL — ABNORMAL HIGH (ref 70–99)
Potassium: 4 mmol/L (ref 3.5–5.1)
Sodium: 135 mmol/L (ref 135–145)

## 2022-07-14 LAB — CBC
HCT: 32 % — ABNORMAL LOW (ref 36.0–46.0)
Hemoglobin: 10.1 g/dL — ABNORMAL LOW (ref 12.0–15.0)
MCH: 25.6 pg — ABNORMAL LOW (ref 26.0–34.0)
MCHC: 31.6 g/dL (ref 30.0–36.0)
MCV: 81 fL (ref 80.0–100.0)
Platelets: 302 10*3/uL (ref 150–400)
RBC: 3.95 MIL/uL (ref 3.87–5.11)
RDW: 16.7 % — ABNORMAL HIGH (ref 11.5–15.5)
WBC: 7.3 10*3/uL (ref 4.0–10.5)
nRBC: 0 % (ref 0.0–0.2)

## 2022-07-14 MED ORDER — FUROSEMIDE 20 MG PO TABS
20.0000 mg | ORAL_TABLET | Freq: Every day | ORAL | 3 refills | Status: DC | PRN
  Filled 2022-07-14: qty 30, 30d supply, fill #0

## 2022-07-14 MED ORDER — LOSARTAN POTASSIUM 25 MG PO TABS
25.0000 mg | ORAL_TABLET | Freq: Every day | ORAL | 2 refills | Status: DC
  Filled 2022-07-14: qty 30, 30d supply, fill #0
  Filled 2022-08-16: qty 30, 30d supply, fill #1

## 2022-07-14 MED ORDER — IRON SUCROSE 500 MG IVPB - SIMPLE MED
500.0000 mg | Freq: Once | INTRAVENOUS | Status: AC
  Administered 2022-07-14: 500 mg via INTRAVENOUS
  Filled 2022-07-14: qty 275

## 2022-07-14 MED ORDER — FLUTICASONE FUROATE-VILANTEROL 100-25 MCG/ACT IN AEPB
1.0000 | INHALATION_SPRAY | Freq: Every day | RESPIRATORY_TRACT | Status: DC
  Filled 2022-07-14: qty 28

## 2022-07-14 MED ORDER — FLUTICASONE FUROATE-VILANTEROL 100-25 MCG/ACT IN AEPB
1.0000 | INHALATION_SPRAY | Freq: Every day | RESPIRATORY_TRACT | 0 refills | Status: DC
  Filled 2022-07-14: qty 60, 30d supply, fill #0

## 2022-07-14 MED ORDER — FUROSEMIDE 20 MG PO TABS
20.0000 mg | ORAL_TABLET | Freq: Every day | ORAL | Status: DC
  Administered 2022-07-14: 20 mg via ORAL
  Filled 2022-07-14: qty 1

## 2022-07-14 MED ORDER — ALBUTEROL SULFATE HFA 108 (90 BASE) MCG/ACT IN AERS
2.0000 | INHALATION_SPRAY | Freq: Four times a day (QID) | RESPIRATORY_TRACT | 2 refills | Status: AC | PRN
  Filled 2022-07-14: qty 6.7, 25d supply, fill #0

## 2022-07-14 MED ORDER — IRON POLYSACCH CMPLX-B12-FA 150-0.025-1 MG PO CAPS
1.0000 | ORAL_CAPSULE | Freq: Every day | ORAL | 3 refills | Status: DC
  Filled 2022-07-14: qty 30, 30d supply, fill #0
  Filled 2022-08-16: qty 30, 30d supply, fill #1

## 2022-07-14 MED ORDER — PRIMIDONE 50 MG PO TABS
100.0000 mg | ORAL_TABLET | Freq: Three times a day (TID) | ORAL | 0 refills | Status: DC
  Filled 2022-07-14: qty 165, 28d supply, fill #0

## 2022-07-14 NOTE — Progress Notes (Signed)
SATURATION QUALIFICATIONS: (This note is used to comply with regulatory documentation for home oxygen)  Patient Saturations on Room Air at Rest = 95%  Patient Saturations on Room Air while Ambulating = 90%  Patient Saturations on 2 Liters of oxygen while Ambulating = 94%  Please briefly explain why patient needs home oxygen: No O2 needed for home use

## 2022-07-14 NOTE — Progress Notes (Signed)
Nurse requested Mobility Specialist to perform oxygen saturation test with pt which includes removing pt from oxygen both at rest and while ambulating.  Below are the results from that testing.     Patient Saturations on Room Air at Rest = spO2 100%  Patient Saturations on Room Air while Ambulating = sp02 90% .   Reported results to nurse.   Nelta Numbers Mobility Specialist Please contact via SecureChat or  Rehab office at [phone]

## 2022-07-14 NOTE — Progress Notes (Signed)
Mobility Specialist Progress Note:   07/14/22 1210  Mobility  Activity Ambulated independently in hallway  Level of Assistance Independent  Assistive Device None  Distance Ambulated (ft) 500 ft  Activity Response Tolerated well  Mobility Referral Yes  $Mobility charge 1 Mobility   Pt agreeable to mobility session. Required no physical assistance. SpO2 WFL on RA. Pt back in bed with all needs met.   Nelta Numbers Mobility Specialist Please contact via SecureChat or  Rehab office at [phone]

## 2022-07-14 NOTE — Discharge Summary (Signed)
Physician Discharge Summary   Patient: [name] MRN: [id number] DOB: March 20, 1971  Admit date:     07/12/2022  Discharge date: 07/14/22  Discharge Physician: Estill Cotta, MD    PCP: Benito Mccreedy, MD   Recommendations at discharge:   Please follow BP, adjust medications Started on losartan 25 mg daily started Lasix 20 mg daily as needed for edema, shortness of breath Started Niferex-150 milligrams daily  Discharge Diagnoses:  Acute dyspnea  Hypertensive urgency Acute diastolic dysfunction Acute bronchitis  Cardiomegaly   Elevated troponin   ANEMIA-IRON DEFICIENCY   Schizoaffective disorder, bipolar type (HCC)   Obesity, Class III, BMI 40-49.9 (morbid obesity) [facility])    Hospital Course:  Patient is a 52 year old female with hypertension, asthma, bipolar disorder, schizoaffective disorder, tobacco use presented with shortness of breath over the last 2 days prior to admission.  Patient reported that it felt like she was unable to catch a full breath.  Also reported discomfort across her back, coughing and wheezing.  She had tried using her home inhalers without improvement.  At baseline not on O2.  No fevers, leg swelling, calf pain, chest pain. In ED, BP was elevated up to 185/86, O2 sats stayed stable on room air. Hemoglobin 9.2, troponin 16-18, BNP 16.4.  EKG showed no acute ischemic changes. Chest x-ray showed clear lungs with cardiomegaly.  Patient was given Lasix 20 mg IV x 1.   Assessment and Plan:  Shortness of breath, likely multifactorial due to acute bronchitis/acute asthma, acute mild diastolic CHF, iron deficiency anemia, hypertensive urgency, likely may have undiagnosed OSA and obesity hypoventilation -Patient received Lasix 20 mg IV x 1, feels better. -CT angiogram of the chest showed no PE, no pneumonia or pleural effusions. -EKG showed normal sinus rhythm, no acute changes of ischemia -Home O2 evaluation prior to discharge, Lasix 20 mg daily as  needed -Recommend sleep study outpatient  Cardiomegaly -Likely due to uncontrolled hypertension, diastolic CHF -2D echo showed EF of 60 to 65%, G1 DD, normal pulmonary artery systolic pressure.  Right ventricular systolic function normal. -Currently stable, lungs clear, placed on Lasix 20 mg daily as needed outpatient, will give 1 dose of oral Lasix 20 mg x 1 -Recommend outpatient sleep study to rule out OSA  Hypertensive urgency -Started on losartan 25 mg daily, Lasix 20 mg daily as needed -Needs outpatient close follow-up with PCP and adjust medications as needed  Iron deficiency anemia -Hemoglobin 11.4 on admission, iron 22, percent saturation ratio 6, ferritin 13 -Will give 1 dose of IV iron prior to discharge, placed on oral iron supplements   Acute bronchitis, underlying history of asthma -Currently no acute wheezing, placed on albuterol inhaler as needed, Breo daily Home O2 evaluation prior to discharge.  Bipolar disorder, schizoaffective disorder Home medications were verified by pharmacy and admitting physician, continue home regimen  Morbid obesity Body mass index is 47.11 kg/m.       Pain control - [employer] Controlled Substance Reporting System database was reviewed. and patient was instructed, not to drive, operate heavy machinery, perform activities at heights, swimming or participation in water activities or provide baby-sitting services while on Pain, Sleep and Anxiety Medications; until their outpatient Physician has advised to do so again. Also recommended to not to take more than prescribed Pain, Sleep and Anxiety Medications.  Consultants: None Procedures performed: 2D echo Disposition: Home Diet recommendation:  Discharge Diet Orders (From admission, onward)     Start     Ordered   07/14/22 0000  Diet - low sodium heart healthy        07/14/22 0953           Cardiac diet DISCHARGE MEDICATION: Allergies as of 07/14/2022       Reactions    Latuda [lurasidone Hcl] Nausea And Vomiting, Other (See Comments)   Shaking    Abilify [aripiprazole] Other (See Comments)   shaking   Flagyl [metronidazole Hcl] Nausea And Vomiting   Prolixin [fluphenazine] Other (See Comments)   shaking   Risperidone And Related Other (See Comments)   shaking   Seroquel [quetiapine Fumarate] Other (See Comments)   Patient states she had a syncopal episode after taking medication.    Sulfa Antibiotics Nausea And Vomiting        Medication List     TAKE these medications    albuterol 108 (90 Base) MCG/ACT inhaler Commonly known as: VENTOLIN HFA Inhale 2 puffs into the lungs every 6 (six) hours as needed for wheezing or shortness of breath.   desvenlafaxine 25 MG 24 hr tablet Commonly known as: PRISTIQ Take 25 mg by mouth daily.   fluticasone furoate-vilanterol 100-25 MCG/ACT Aepb Commonly known as: BREO ELLIPTA Inhale 1 puff into the lungs daily.   furosemide 20 MG tablet Commonly known as: LASIX Take 1 tablet (20 mg total) by mouth daily as needed for fluid or edema (Shortness of breath).   iron polysaccharides 150 MG capsule Commonly known as: NIFEREX Take 1 capsule (150 mg total) by mouth daily.   losartan 25 MG tablet Commonly known as: Cozaar Take 1 tablet (25 mg total) by mouth daily.   naproxen sodium 220 MG tablet Commonly known as: ALEVE Take 220 mg by mouth daily as needed.   OLANZapine 10 MG tablet Commonly known as: ZYPREXA Take 10 mg by mouth 2 (two) times daily.   primidone 50 MG tablet Commonly known as: MYSOLINE Take 2 tablets (100 mg total) by mouth 3 (three) times daily.        Follow-up Information     Osei-Bonsu, Iona Beard, MD. Schedule an appointment as soon as possible for a visit in 2 week(s).   Specialty: Internal Medicine Why: for hospital follow-up, obtain labs, BMET for potassium and renal function. Contact information: [id number] [place] [address] [phone]                 Discharge Exam: Danley Danker Weights   07/13/22 0500 07/14/22 0405  Weight: 128.5 kg 128.4 kg   S: Feels a lot better, wants to go home.  Physical Exam General: Alert and oriented x 3, NAD Cardiovascular: S1 S2 clear, RRR.  Respiratory: CTAB, no wheezing Gastrointestinal: Soft, nontender, nondistended, NBS Ext: no pedal edema bilaterally Neuro: no new deficits Psych: Normal affect    Condition at discharge: fair  The results of significant diagnostics from this hospitalization (including imaging, microbiology, ancillary and laboratory) are listed below for reference.   Imaging Studies: ECHOCARDIOGRAM COMPLETE  Result Date: 07/13/2022    ECHOCARDIOGRAM REPORT   Patient Name:   [name] Date of Exam: 07/13/2022 Medical Rec #:  [id number]          Height:       65.0 in Accession #:    [id number]         Weight:       283.3 lb Date of Birth:  October 07, 1970           BSA:          2.294  m Patient Age:    61 years           BP:           138/72 mmHg Patient Gender: F                  HR:           56 bpm. Exam Location:  Inpatient Procedure: 2D Echo, Color Doppler and Cardiac Doppler Indications:    R06.9 DOE  History:        Patient has prior history of Echocardiogram examinations, most                 recent 02/24/2011. Risk Factors:Hypertension.  Sonographer:    Raquel Sarna Senior RDCS Referring Phys: [phone] [place]  1. Left ventricular ejection fraction, by estimation, is 60 to 65%. The left ventricle has normal function. The left ventricle has no regional wall motion abnormalities. Left ventricular diastolic parameters are consistent with Grade I diastolic dysfunction (impaired relaxation).  2. Right ventricular systolic function is normal. The right ventricular size is normal. There is normal pulmonary artery systolic pressure. The estimated right ventricular systolic pressure is [id number] mmHg.  3. Left atrial size was mildly dilated.  4. A small pericardial effusion  is present. The pericardial effusion is circumferential. There is no evidence of cardiac tamponade.  5. The mitral valve is normal in structure. No evidence of mitral valve regurgitation. No evidence of mitral stenosis.  6. The aortic valve is normal in structure. Aortic valve regurgitation is not visualized. No aortic stenosis is present.  7. The inferior vena cava is normal in size with greater than 50% respiratory variability, suggesting right atrial pressure of 3 mmHg. FINDINGS  Left Ventricle: Left ventricular ejection fraction, by estimation, is 60 to 65%. The left ventricle has normal function. The left ventricle has no regional wall motion abnormalities. The left ventricular internal cavity size was normal in size. There is  no left ventricular hypertrophy. Left ventricular diastolic parameters are consistent with Grade I diastolic dysfunction (impaired relaxation). Indeterminate filling pressures. Right Ventricle: The right ventricular size is normal. No increase in right ventricular wall thickness. Right ventricular systolic function is normal. There is normal pulmonary artery systolic pressure. The tricuspid regurgitant velocity is 2.22 m/s, and  with an assumed right atrial pressure of 3 mmHg, the estimated right ventricular systolic pressure is [id number] mmHg. Left Atrium: Left atrial size was mildly dilated. Right Atrium: Right atrial size was normal in size. Pericardium: A small pericardial effusion is present. The pericardial effusion is circumferential. There is no evidence of cardiac tamponade. Mitral Valve: The mitral valve is normal in structure. No evidence of mitral valve regurgitation. No evidence of mitral valve stenosis. Tricuspid Valve: The tricuspid valve is normal in structure. Tricuspid valve regurgitation is mild . No evidence of tricuspid stenosis. Aortic Valve: The aortic valve is normal in structure. Aortic valve regurgitation is not visualized. No aortic stenosis is present. Pulmonic  Valve: The pulmonic valve was normal in structure. Pulmonic valve regurgitation is trivial. No evidence of pulmonic stenosis. Aorta: The aortic root is normal in size and structure. Venous: The inferior vena cava is normal in size with greater than 50% respiratory variability, suggesting right atrial pressure of 3 mmHg. IAS/Shunts: No atrial level shunt detected by color flow Doppler.  LEFT VENTRICLE PLAX 2D LVIDd:         5.10 cm   Diastology LVIDs:  3.20 cm   LV e' medial:    6.06 cm/s LV PW:         0.90 cm   LV E/e' medial:  13.8 LV IVS:        1.00 cm   LV e' lateral:   8.31 cm/s LVOT diam:     1.90 cm   LV E/e' lateral: 10.0 LV SV:         76 LV SV Index:   33 LVOT Area:     2.84 cm  RIGHT VENTRICLE RV S prime:     16.50 cm/s TAPSE (M-mode): 3.2 cm LEFT ATRIUM             Index        RIGHT ATRIUM           Index LA diam:        4.40 cm 1.92 cm/m   RA Area:     14.60 cm LA Vol (A2C):   55.4 ml 24.15 ml/m  RA Volume:   38.30 ml  16.70 ml/m LA Vol (A4C):   54.4 ml 23.72 ml/m LA Biplane Vol: 54.7 ml 23.85 ml/m  AORTIC VALVE LVOT Vmax:   117.00 cm/s LVOT Vmean:  82.100 cm/s LVOT VTI:    0.268 m  AORTA Ao Root diam: 2.70 cm Ao Asc diam:  3.40 cm MITRAL VALVE               TRICUSPID VALVE MV Area (PHT): 3.02 cm    TR Peak grad:   19.7 mmHg MV Decel Time: 252 msec    TR Vmax:        222.00 cm/s MV E velocity: 83.45 cm/s MV A velocity: 90.20 cm/s  SHUNTS MV E/A ratio:  0.93        Systemic VTI:  0.27 m                            Systemic Diam: 1.90 cm Dani Gobble Croitoru MD Electronically signed by Sanda Klein MD Signature Date/Time: 07/13/2022/4:16:09 PM    Final    CT Angio Chest Pulmonary Embolism (PE) W or WO Contrast  Result Date: 07/13/2022 CLINICAL DATA:  Pleuritic back EXAM: CT ANGIOGRAPHY CHEST WITH CONTRAST TECHNIQUE: Multidetector CT imaging of the chest was performed using the standard protocol during bolus administration of intravenous contrast. Multiplanar CT image reconstructions and  MIPs were obtained to evaluate the vascular anatomy. RADIATION DOSE REDUCTION: This exam was performed according to the departmental dose-optimization program which includes automated exposure control, adjustment of the mA and/or kV according to patient size and/or use of iterative reconstruction technique. CONTRAST:  70mL OMNIPAQUE IOHEXOL 350 MG/ML SOLN COMPARISON:  02/24/2011 CTA chest FINDINGS: Cardiovascular: Satisfactory opacification of the pulmonary arteries to the segmental level. No evidence of pulmonary embolism. The main pulmonary artery is normal in size. Cardiomegaly. No pericardial effusion. Four vessel aortic arch, with the left vertebral artery arising directly from the aorta. Mediastinum/Nodes: No enlarged mediastinal, hilar, or axillary lymph nodes. Thyroid gland, trachea, and esophagus demonstrate no significant findings. Lungs/Pleura: Linear opacities in the left-greater-than-right lung base, likely atelectasis and/or scarring. No pleural effusion or pneumothorax. Upper Abdomen: Small calcified lesion in the right hepatic lobe, likely sequela of prior granulomatous disease. Subcentimeter cystic foci in the right hepatic lobe are too small to characterize but most likely hepatic cysts. Cholelithiasis. No evidence cholecystitis. Musculoskeletal: No chest wall abnormality. No acute or significant osseous findings. Review of the  MIP images confirms the above findings. IMPRESSION: 1. No evidence of pulmonary embolism. 2. Cardiomegaly. Electronically Signed   By: Merilyn Baba M.D.   On: 07/13/2022 15:45   DG Chest Portable 1 View  Result Date: 07/13/2022 CLINICAL DATA:  Shortness of breath ongoing since yesterday. Back pain. Elevated blood pressure. EXAM: PORTABLE CHEST 1 VIEW COMPARISON:  08/29/2020 FINDINGS: Shallow inspiration. Cardiac enlargement. No vascular congestion, edema, or consolidation. No pleural effusions. No pneumothorax. Mediastinal contours appear intact. IMPRESSION: Cardiac  enlargement.  Lungs are clear. Electronically Signed   By: Lucienne Capers M.D.   On: 07/13/2022 00:14    Microbiology: Results for orders placed or performed during the hospital encounter of 07/12/22  MRSA Next Gen by PCR, Nasal     Status: None   Collection Time: 07/13/22  5:53 AM   Specimen: Nasal Mucosa; Nasal Swab  Result Value Ref Range Status   MRSA by PCR Next Gen NOT DETECTED NOT DETECTED Final    Comment: (NOTE) The GeneXpert MRSA Assay (FDA approved for NASAL specimens only), is one component of a comprehensive MRSA colonization surveillance program. It is not intended to diagnose MRSA infection nor to guide or monitor treatment for MRSA infections. Test performance is not FDA approved in patients less than 40 years old. Performed at [facility], [place] [address], [place], [place] [address]     Labs: CBC: Recent Labs  Lab 07/13/22 0000 07/13/22 0802 07/14/22 0025  WBC 9.5  --  7.3  NEUTROABS 6.0  --   --   HGB 9.8* 9.5* 10.1*  HCT 30.8* 29.6* 32.0*  MCV 81.5  --  81.0  PLT 300  --  [id number]   Basic Metabolic Panel: Recent Labs  Lab 07/13/22 0000 07/14/22 0025  NA 137 135  K 3.5 4.0  CL 104 103  CO2 20* 24  GLUCOSE 98 105*  BUN 10 9  CREATININE 0.86 0.89  CALCIUM 9.6 9.0   Liver Function Tests: No results for input(s): "AST", "ALT", "ALKPHOS", "BILITOT", "PROT", "ALBUMIN" in the last 168 hours. CBG: No results for input(s): "GLUCAP" in the last 168 hours.  Discharge time spent: greater than 30 minutes.  Signed: Estill Cotta, MD [facility] 07/14/2022

## 2022-08-16 ENCOUNTER — Other Ambulatory Visit (HOSPITAL_COMMUNITY): Payer: Self-pay

## 2022-12-04 ENCOUNTER — Other Ambulatory Visit: Payer: Self-pay

## 2022-12-04 ENCOUNTER — Emergency Department (HOSPITAL_COMMUNITY)
Admission: EM | Admit: 2022-12-04 | Discharge: 2022-12-04 | Disposition: A | Discharge status: Home / Self Care | Payer: MEDICAID | Attending: Emergency Medicine | Admitting: Emergency Medicine

## 2022-12-04 DIAGNOSIS — M62830 Muscle spasm of back: Secondary | ICD-10-CM | POA: Insufficient documentation

## 2022-12-04 DIAGNOSIS — J45909 Unspecified asthma, uncomplicated: Secondary | ICD-10-CM | POA: Diagnosis not present

## 2022-12-04 DIAGNOSIS — F17210 Nicotine dependence, cigarettes, uncomplicated: Secondary | ICD-10-CM | POA: Diagnosis not present

## 2022-12-04 DIAGNOSIS — M545 Low back pain, unspecified: Secondary | ICD-10-CM | POA: Diagnosis present

## 2022-12-04 MED ORDER — METHOCARBAMOL 500 MG PO TABS: 500.0000 mg | ORAL_TABLET | Freq: Three times a day (TID) | ORAL | 0 refills | Status: DC | PRN

## 2022-12-04 MED ORDER — KETOROLAC TROMETHAMINE 15 MG/ML IJ SOLN
30.0000 mg | Freq: Once | INTRAMUSCULAR | Status: AC
  Administered 2022-12-04: 30 mg via INTRAMUSCULAR
  Filled 2022-12-04: qty 2

## 2022-12-04 MED ORDER — NAPROXEN 500 MG PO TABS: 500.0000 mg | ORAL_TABLET | Freq: Two times a day (BID) | ORAL | 0 refills | Status: DC

## 2022-12-04 MED ORDER — HYDROCODONE-ACETAMINOPHEN 5-325 MG PO TABS
1.0000 | ORAL_TABLET | Freq: Once | ORAL | Status: AC
  Administered 2022-12-04: 1 via ORAL
  Filled 2022-12-04: qty 1

## 2022-12-04 NOTE — ED Triage Notes (Signed)
Pt arrives to ED c/o back pain x 2 days. Pt reports mopping at home and states that she felt a muscle pull in lower back. Pain radiates to both legs. No relief with OTC meds

## 2022-12-04 NOTE — ED Provider Notes (Signed)
[facility]EMERGENCY DEPT [facility] Emergency Department Provider Note MRN:  [id number]  Arrival date & time: 12/04/22     Chief Complaint   Back Pain   History of Present Illness   [name] is a 52 y.o. year-old female with a history of schizophrenia presenting to the ED with chief complaint of back pain.  Patient has been caring for a new dog in the home, was doing a fair amount of bending over today to clean up after the dog.  Had a sudden pain in the lower back described as a spasm.  Continuing throughout the day.  No numbness or weakness to the arms or legs, no bowel or bladder dysfunction.  Review of Systems  A thorough review of systems was obtained and all systems are negative except as noted in the HPI and PMH.   Patient's Health History    Past Medical History:  Diagnosis Date   Arthritis    Asthma    Bipolar 1 disorder (HCC)    Bronchitis    Depression    Hemorrhoid    Schizoaffective disorder (HCC)    Schizophrenia (HCC)    Pt denies and reports it is schizoaffective disorder.    Transfusion history     Past Surgical History:  Procedure Laterality Date   Arm surgery     CESAREAN SECTION     COLONOSCOPY     5-6 years ago per pt-near [place] normal exam per pt    Family History  Problem Relation Age of Onset   Hypertension Mother    Hypertension Father    Diabetes type II Other    Stomach cancer Cousin    Colon cancer Neg Hx    Colon polyps Neg Hx    Esophageal cancer Neg Hx    Rectal cancer Neg Hx     Social History   Socioeconomic History   Marital status: Single    Spouse name: Not on file   Number of children: Not on file   Years of education: Not on file   Highest education level: Not on file  Occupational History   Occupation: unemployed  Tobacco Use   Smoking status: Every Day    Current packs/day: 0.50    Average packs/day: 0.5 packs/day for 17.0 years (8.5 ttl pk-yrs)    Types: Cigarettes   Smokeless tobacco: Never   Vaping Use   Vaping status: Every Day  Substance and Sexual Activity   Alcohol use: No   Drug use: Not Currently    Types: Marijuana    Comment: Former user   Sexual activity: Not Currently  Other Topics Concern   Not on file  Social History Narrative   ** Merged History Encounter **       Social Determinants of Health   Financial Resource Strain: Not on file  Food Insecurity: No Food Insecurity (07/13/2022)   Hunger Vital Sign    Worried About Running Out of Food in the Last Year: Never true    Ran Out of Food in the Last Year: Never true  Transportation Needs: No Transportation Needs (07/13/2022)   PRAPARE - Administrator, Civil Service (Medical): No    Lack of Transportation (Non-Medical): No  Physical Activity: Not on file  Stress: Not on file  Social Connections: Not on file  Intimate Partner Violence: Not At Risk (07/13/2022)   Humiliation, Afraid, Rape, and Kick questionnaire    Fear of Current or Ex-Partner: No    Emotionally  Abused: No    Physically Abused: No    Sexually Abused: No     Physical Exam   Vitals:   12/04/22 0508  BP: (!) 149/69  Pulse: 70  Resp: 16  Temp: 98.1 F (36.7 C)  SpO2: 99%    CONSTITUTIONAL: Well-appearing, NAD NEURO/PSYCH:  Alert and oriented x 3, no focal deficits EYES:  eyes equal and reactive ENT/NECK:  no LAD, no JVD CARDIO: Regular rate, well-perfused, normal S1 and S2 PULM:  CTAB no wheezing or rhonchi GI/GU:  non-distended, non-tender MSK/SPINE:  No gross deformities, no edema SKIN:  no rash, atraumatic   *Additional and/or pertinent findings included in MDM below  Diagnostic and Interventional Summary    EKG Interpretation Date/Time:    Ventricular Rate:    PR Interval:    QRS Duration:    QT Interval:    QTC Calculation:   R Axis:      Text Interpretation:         Labs Reviewed - No data to display  No orders to display    Medications  ketorolac (TORADOL) 15 MG/ML injection 30 mg (has  no administration in time range)  HYDROcodone-acetaminophen (NORCO/VICODIN) 5-325 MG per tablet 1 tablet (has no administration in time range)     Procedures  /  Critical Care Procedures  ED Course and Medical Decision Making  Initial Impression and Ddx Tender lumbar musculature on exam.  Reassuring neurological exam with no signs or symptoms of myelopathy.  No trauma to warrant imaging.  Past medical/surgical history that increases complexity of ED encounter: None  Interpretation of Diagnostics Laboratory and/or imaging options to aid in the diagnosis/care of the patient were considered.  After careful history and physical examination, it was determined that there was no indication for diagnostics at this time.  Patient Reassessment and Ultimate Disposition/Management     Discharge  Patient management required discussion with the following services or consulting groups:  None  Complexity of Problems Addressed Acute complicated illness or Injury  Additional Data Reviewed and Analyzed Further history obtained from: Prior labs/imaging results  Additional Factors Impacting ED Encounter Risk Prescriptions  Elmer Sow. Pilar Plate, MD [facility] Emergency Medicine [facility] [email or web address]  Final Clinical Impressions(s) / ED Diagnoses     ICD-10-CM   1. Back spasm  M62.830       ED Discharge Orders          Ordered    naproxen (NAPROSYN) 500 MG tablet  2 times daily        12/04/22 0627    methocarbamol (ROBAXIN) 500 MG tablet  Every 8 hours PRN        12/04/22 2536             Discharge Instructions Discussed with and Provided to Patient:    Discharge Instructions      You were evaluated in the Emergency Department and after careful evaluation, we did not find any emergent condition requiring admission or further testing in the hospital.  Your exam/testing today is overall reassuring.  Symptoms likely due to muscle spasm.  Recommend using  the Naprosyn twice daily as prescribed for pain.  Can use the Robaxin muscle relaxer for more significant pain, best used at night if you are having trouble sleeping as it can cause drowsiness.  Please return to the Emergency Department if you experience any worsening of your condition.   Thank you for allowing [place] to be a part of your care.  Sabas Sous, MD 12/04/22 [phone]

## 2022-12-04 NOTE — Discharge Instructions (Signed)
You were evaluated in the Emergency Department and after careful evaluation, we did not find any emergent condition requiring admission or further testing in the hospital.  Your exam/testing today is overall reassuring.  Symptoms likely due to muscle spasm.  Recommend using the Naprosyn twice daily as prescribed for pain.  Can use the Robaxin muscle relaxer for more significant pain, best used at night if you are having trouble sleeping as it can cause drowsiness.  Please return to the Emergency Department if you experience any worsening of your condition.   Thank you for allowing [place] to be a part of your care.

## 2022-12-04 NOTE — ED Notes (Signed)
Pt called for room no answer

## 2023-03-25 ENCOUNTER — Emergency Department (HOSPITAL_COMMUNITY)
Admission: EM | Admit: 2023-03-25 | Discharge: 2023-03-25 | Disposition: A | Discharge status: Home / Self Care | Payer: MEDICAID | Attending: Student | Admitting: Student

## 2023-03-25 ENCOUNTER — Other Ambulatory Visit: Payer: Self-pay

## 2023-03-25 ENCOUNTER — Encounter (HOSPITAL_COMMUNITY): Payer: Self-pay

## 2023-03-25 DIAGNOSIS — I10 Essential (primary) hypertension: Secondary | ICD-10-CM | POA: Insufficient documentation

## 2023-03-25 DIAGNOSIS — R040 Epistaxis: Secondary | ICD-10-CM | POA: Insufficient documentation

## 2023-03-25 DIAGNOSIS — N898 Other specified noninflammatory disorders of vagina: Secondary | ICD-10-CM | POA: Diagnosis present

## 2023-03-25 DIAGNOSIS — J45909 Unspecified asthma, uncomplicated: Secondary | ICD-10-CM | POA: Diagnosis not present

## 2023-03-25 DIAGNOSIS — F17200 Nicotine dependence, unspecified, uncomplicated: Secondary | ICD-10-CM | POA: Insufficient documentation

## 2023-03-25 LAB — URINALYSIS, ROUTINE W REFLEX MICROSCOPIC
Bacteria, UA: NONE SEEN
Bilirubin Urine: NEGATIVE
Glucose, UA: NEGATIVE mg/dL
Ketones, ur: NEGATIVE mg/dL
Nitrite: NEGATIVE
Protein, ur: NEGATIVE mg/dL
Specific Gravity, Urine: 1.019 (ref 1.005–1.030)
WBC, UA: >50 WBC/hpf (ref 0–5)
pH: 5 (ref 5.0–8.0)

## 2023-03-25 LAB — WET PREP, GENITAL
Clue Cells Wet Prep HPF POC: NONE SEEN
Sperm: NONE SEEN
Trich, Wet Prep: NONE SEEN
WBC, Wet Prep HPF POC: <10 (ref <10)
Yeast Wet Prep HPF POC: NONE SEEN

## 2023-03-25 LAB — PREGNANCY, URINE: Preg Test, Ur: NEGATIVE

## 2023-03-25 NOTE — ED Triage Notes (Signed)
Patient reports intermittent nose bleeds x 4 days. Does not take blood thinners.  Also reports vaginal itching and odor x "a few weeks."

## 2023-03-25 NOTE — Discharge Instructions (Signed)
Thank you for letting us evaluate you today.  Your urine was negative for infection.  Swab was negative for yeast and bacterial vaginosis.  Please follow-up with OB as recommended above for further management.  Emergency department if you experience significant vaginal bleeding (>1 pad an hr), vaginal pain

## 2023-03-25 NOTE — ED Provider Notes (Signed)
[place] EMERGENCY DEPARTMENT AT [facility] Provider Note   CSN: [id number] Arrival date & time: 03/25/23  1231     History {Add pertinent medical, surgical, social history, OB history to HPI:1} Chief Complaint  Patient presents with   Epistaxis   Vaginal Itching    [name] is a 52 y.o. female. Epistaxis - nose bleed at night for week which quickly resolves, no clots. Had epistaxis since kid. No thinners.  BV for a month. Seen by GYN who gave her boric acid without relief to symptoms. Not intereted in STD testing. Vaginal odor and pruritus. No urinary symptoms. Lmp first week of october    Epistaxis Vaginal Itching       Home Medications Prior to Admission medications   Medication Sig Start Date End Date Taking? Authorizing Provider  albuterol (VENTOLIN HFA) 108 (90 Base) MCG/ACT inhaler Inhale 2 puffs into the lungs every 6 (six) hours as needed for wheezing or shortness of breath. 07/14/22   Rai, Ripudeep K, MD  desvenlafaxine (PRISTIQ) 25 MG 24 hr tablet Take 25 mg by mouth daily.    [provider]  fluticasone furoate-vilanterol (BREO ELLIPTA) 100-25 MCG/ACT AEPB Inhale 1 puff into the lungs daily. 07/14/22   Rai, Delene Ruffini, MD  furosemide (LASIX) 20 MG tablet Take 1 tablet (20 mg total) by mouth daily as needed for fluid or edema (Shortness of breath). 07/14/22   Rai, Delene Ruffini, MD  Iron Polysacch Cmplx-B12-FA 150-0.025-1 MG CAPS Take 1 capsule by mouth daily. 07/14/22   Rai, Delene Ruffini, MD  losartan (COZAAR) 25 MG tablet Take 1 tablet (25 mg total) by mouth daily. 07/14/22   Rai, Delene Ruffini, MD  methocarbamol (ROBAXIN) 500 MG tablet Take 1 tablet (500 mg total) by mouth every 8 (eight) hours as needed for muscle spasms. 12/04/22   Sabas Sous, MD  naproxen (NAPROSYN) 500 MG tablet Take 1 tablet (500 mg total) by mouth 2 (two) times daily. 12/04/22   Sabas Sous, MD  naproxen sodium (ALEVE) 220 MG tablet Take 220 mg by mouth daily as  needed.    [provider]  OLANZapine (ZYPREXA) 10 MG tablet Take 10 mg by mouth 2 (two) times daily.    [provider]  primidone (MYSOLINE) 50 MG tablet Take 2 tablets (100 mg total) by mouth 3 (three) times daily. 07/14/22   Rai, Delene Ruffini, MD  iron polysaccharides (NIFEREX) 150 MG capsule Take 150 mg by mouth 2 (two) times daily.  10/01/12  [provider]      Allergies    Kasandra Knudsen [lurasidone hcl], Abilify [aripiprazole], Flagyl [metronidazole hcl], Prolixin [fluphenazine], Risperidone and related, Seroquel [quetiapine fumarate], and Sulfa antibiotics    Review of Systems   Review of Systems  HENT:  Positive for nosebleeds.     Physical Exam Updated Vital Signs BP (!) 151/78   Pulse 83   Temp 97.7 F (36.5 C) (Oral)   Resp 18   Ht 5\' 4"  (1.626 m)   Wt 117.9 kg   SpO2 96%   BMI 44.62 kg/m  Physical Exam  ED Results / Procedures / Treatments   Labs (all labs ordered are listed, but only abnormal results are displayed) Labs Reviewed - No data to display  EKG None  Radiology No results found.  Procedures Procedures  {Document cardiac monitor, telemetry assessment procedure when appropriate:1}  Medications Ordered in ED Medications - No data to display  ED Course/ Medical Decision Making/ A&P   {  Click here for ABCD2, HEART and other calculatorsREFRESH Note before signing :1}                              Medical Decision Making  ***  {Document critical care time when appropriate:1} {Document review of labs and clinical decision tools ie heart score, Chads2Vasc2 etc:1}  {Document your independent review of radiology images, and any outside records:1} {Document your discussion with family members, caretakers, and with consultants:1} {Document social determinants of health affecting pt's care:1} {Document your decision making why or why not admission, treatments were needed:1} Final Clinical Impression(s) / ED Diagnoses Final  diagnoses:  None    Rx / DC Orders ED Discharge Orders     None

## 2023-08-20 ENCOUNTER — Emergency Department (HOSPITAL_COMMUNITY)
Admission: EM | Admit: 2023-08-20 | Discharge: 2023-08-20 | Disposition: A | Discharge status: Home / Self Care | Payer: MEDICAID | Attending: Emergency Medicine | Admitting: Emergency Medicine

## 2023-08-20 ENCOUNTER — Other Ambulatory Visit: Payer: Self-pay

## 2023-08-20 DIAGNOSIS — J45909 Unspecified asthma, uncomplicated: Secondary | ICD-10-CM | POA: Diagnosis not present

## 2023-08-20 DIAGNOSIS — N390 Urinary tract infection, site not specified: Secondary | ICD-10-CM | POA: Diagnosis not present

## 2023-08-20 DIAGNOSIS — I10 Essential (primary) hypertension: Secondary | ICD-10-CM | POA: Diagnosis not present

## 2023-08-20 DIAGNOSIS — D72829 Elevated white blood cell count, unspecified: Secondary | ICD-10-CM | POA: Diagnosis not present

## 2023-08-20 DIAGNOSIS — R102 Pelvic and perineal pain: Secondary | ICD-10-CM | POA: Diagnosis present

## 2023-08-20 LAB — COMPREHENSIVE METABOLIC PANEL WITH GFR
ALT: 18 U/L (ref 0–44)
AST: 31 U/L (ref 15–41)
Albumin: 3.6 g/dL (ref 3.5–5.0)
Alkaline Phosphatase: 97 U/L (ref 38–126)
Anion gap: 11 (ref 5–15)
BUN: 12 mg/dL (ref 6–20)
CO2: 23 mmol/L (ref 22–32)
Calcium: 9.3 mg/dL (ref 8.9–10.3)
Chloride: 97 mmol/L — ABNORMAL LOW (ref 98–111)
Creatinine, Ser: 0.84 mg/dL (ref 0.44–1.00)
GFR, Estimated: >60 mL/min (ref >60)
Glucose, Bld: 241 mg/dL — ABNORMAL HIGH (ref 70–99)
Potassium: 4.2 mmol/L (ref 3.5–5.1)
Sodium: 131 mmol/L — ABNORMAL LOW (ref 135–145)
Total Bilirubin: 0.7 mg/dL (ref 0.0–1.2)
Total Protein: 8.1 g/dL (ref 6.5–8.1)

## 2023-08-20 LAB — WET PREP, GENITAL
Clue Cells Wet Prep HPF POC: NONE SEEN
Sperm: NONE SEEN
Trich, Wet Prep: NONE SEEN
WBC, Wet Prep HPF POC: >=10 — AB (ref <10)
Yeast Wet Prep HPF POC: NONE SEEN

## 2023-08-20 LAB — URINALYSIS, W/ REFLEX TO CULTURE (INFECTION SUSPECTED)
Bilirubin Urine: NEGATIVE
Glucose, UA: NEGATIVE mg/dL
Hgb urine dipstick: NEGATIVE
Ketones, ur: NEGATIVE mg/dL
Nitrite: POSITIVE — AB
Protein, ur: 30 mg/dL — AB
Specific Gravity, Urine: 1.025 (ref 1.005–1.030)
pH: 5 (ref 5.0–8.0)

## 2023-08-20 LAB — CBC WITH DIFFERENTIAL/PLATELET
Abs Immature Granulocytes: 0.04 10*3/uL (ref 0.00–0.07)
Basophils Absolute: 0.1 10*3/uL (ref 0.0–0.1)
Basophils Relative: 1 %
Eosinophils Absolute: 0.1 10*3/uL (ref 0.0–0.5)
Eosinophils Relative: 1 %
HCT: 35.6 % — ABNORMAL LOW (ref 36.0–46.0)
Hemoglobin: 11.3 g/dL — ABNORMAL LOW (ref 12.0–15.0)
Immature Granulocytes: 0 %
Lymphocytes Relative: 38 %
Lymphs Abs: 4.5 10*3/uL — ABNORMAL HIGH (ref 0.7–4.0)
MCH: 27 pg (ref 26.0–34.0)
MCHC: 31.7 g/dL (ref 30.0–36.0)
MCV: 85 fL (ref 80.0–100.0)
Monocytes Absolute: 0.7 10*3/uL (ref 0.1–1.0)
Monocytes Relative: 6 %
Neutro Abs: 6.4 10*3/uL (ref 1.7–7.7)
Neutrophils Relative %: 54 %
Platelets: 313 10*3/uL (ref 150–400)
RBC: 4.19 MIL/uL (ref 3.87–5.11)
RDW: 15.9 % — ABNORMAL HIGH (ref 11.5–15.5)
WBC: 11.9 10*3/uL — ABNORMAL HIGH (ref 4.0–10.5)
nRBC: 0 % (ref 0.0–0.2)

## 2023-08-20 LAB — PREGNANCY, URINE: Preg Test, Ur: NEGATIVE

## 2023-08-20 LAB — LIPASE, BLOOD: Lipase: 26 U/L (ref 11–51)

## 2023-08-20 MED ORDER — CLOTRIMAZOLE 1 % EX CREA: TOPICAL_CREAM | CUTANEOUS | 0 refills | Status: DC

## 2023-08-20 MED ORDER — CEFADROXIL 500 MG PO CAPS: 500.0000 mg | ORAL_CAPSULE | Freq: Two times a day (BID) | ORAL | 0 refills | Status: AC

## 2023-08-20 NOTE — ED Triage Notes (Signed)
 Arrived via EMS for Abdominal pain, Brown discharge and foul smell onset yesterday.  170/ BP 95% 86 hr

## 2023-08-20 NOTE — ED Provider Notes (Signed)
 [place] EMERGENCY DEPARTMENT AT [facility] Provider Note  CSN: [id number] Arrival date & time: 08/20/23 9562  Chief Complaint(s) Abdominal Pain and Vaginal Discharge  HPI [name] is a 53 y.o. female     Abdominal Pain Pain location:  Suprapubic Pain quality: aching   Pain radiates to:  Does not radiate Pain severity:  Mild Onset quality:  Gradual Duration:  1 week Timing:  Constant Progression:  Unchanged Chronicity:  New Worsened by:  Nothing Associated symptoms: nausea and vaginal discharge   Associated symptoms: no chills, no constipation, no diarrhea, no dysuria, no fatigue, no fever, no vaginal bleeding and no vomiting   Vaginal Discharge Associated symptoms: abdominal pain and nausea   Associated symptoms: no dysuria, no fever and no vomiting     Past Medical History Past Medical History:  Diagnosis Date   Arthritis    Asthma    Bipolar 1 disorder (HCC)    Bronchitis    Depression    Hemorrhoid    Schizoaffective disorder (HCC)    Schizophrenia (HCC)    Pt denies and reports it is schizoaffective disorder.    Transfusion history    Patient Active Problem List   Diagnosis Date Noted   Acute diastolic heart failure (HCC) 07/13/2022   Dyspnea 07/13/2022   Cardiomegaly 07/13/2022   Elevated troponin 07/13/2022   Obesity, Class III, BMI 40-49.9 (morbid obesity) 07/13/2022   Schizophrenia (HCC) 02/07/2019   Schizoaffective disorder (HCC) 11/08/2018   No diagnosis on Axis I 08/21/2015   Abnormal behavior    Involuntary commitment    Psychosis (HCC)    Disturbance in affect    Tobacco use disorder 05/12/2015   Cannabis use disorder, moderate, dependence (HCC) 05/12/2015   Schizo affective schizophrenia (HCC) 05/10/2015   Anxiety disorder 01/27/2013   Hypertension 02/24/2011   ANEMIA-IRON  DEFICIENCY 12/13/2006   GERD 12/13/2006   OBESITY 12/12/2006   Schizoaffective disorder, bipolar type (HCC) 12/12/2006   HEMORRHOIDS, NOS  12/12/2006   PAIN-NECK 12/12/2006   Abnormal involuntary movement 12/12/2006   Home Medication(s) Prior to Admission medications   Medication Sig Start Date End Date Taking? Authorizing Provider  cefadroxil (DURICEF) 500 MG capsule Take 1 capsule (500 mg total) by mouth 2 (two) times daily for 7 days. 08/20/23 08/27/23 Yes Hardin Hardenbrook, Camila Cecil, MD  clotrimazole  (LOTRIMIN ) 1 % cream Apply to affected area 2 times daily 08/20/23  Yes Sid Greener, Camila Cecil, MD  albuterol  (VENTOLIN  HFA) 108 (90 Base) MCG/ACT inhaler Inhale 2 puffs into the lungs every 6 (six) hours as needed for wheezing or shortness of breath. 07/14/22   Rai, Ripudeep K, MD  desvenlafaxine (PRISTIQ) 25 MG 24 hr tablet Take 25 mg by mouth daily.    [provider]  fluticasone  furoate-vilanterol (BREO ELLIPTA ) 100-25 MCG/ACT AEPB Inhale 1 puff into the lungs daily. 07/14/22   Rai, Hurman Maiden, MD  furosemide  (LASIX ) 20 MG tablet Take 1 tablet (20 mg total) by mouth daily as needed for fluid or edema (Shortness of breath). 07/14/22   Rai, Ripudeep Linnell Richardson, MD  Iron  Polysacch Cmplx-B12-FA 150-0.025-1 MG CAPS Take 1 capsule by mouth daily. 07/14/22   Rai, Hurman Maiden, MD  losartan  (COZAAR ) 25 MG tablet Take 1 tablet (25 mg total) by mouth daily. 07/14/22   Rai, Hurman Maiden, MD  methocarbamol  (ROBAXIN ) 500 MG tablet Take 1 tablet (500 mg total) by mouth every 8 (eight) hours as needed for muscle spasms. 12/04/22   Edson Graces, MD  naproxen  (NAPROSYN ) 500  MG tablet Take 1 tablet (500 mg total) by mouth 2 (two) times daily. 12/04/22   Edson Graces, MD  naproxen  sodium (ALEVE ) 220 MG tablet Take 220 mg by mouth daily as needed.    [provider]  OLANZapine  (ZYPREXA ) 10 MG tablet Take 10 mg by mouth 2 (two) times daily.    [provider]  primidone  (MYSOLINE ) 50 MG tablet Take 2 tablets (100 mg total) by mouth 3 (three) times daily. 07/14/22   Rai, Hurman Maiden, MD  iron  polysaccharides (NIFEREX) 150 MG capsule Take 150 mg by  mouth 2 (two) times daily.  10/01/12  [provider]                                                                                                                                    Allergies Latuda  [lurasidone  hcl], Abilify  [aripiprazole ], Flagyl  [metronidazole  hcl], Prolixin  [fluphenazine ], Risperidone  and related, Seroquel  [quetiapine  fumarate], and Sulfa antibiotics  Review of Systems Review of Systems  Constitutional:  Negative for chills, fatigue and fever.  Gastrointestinal:  Positive for abdominal pain and nausea. Negative for constipation, diarrhea and vomiting.  Genitourinary:  Positive for vaginal discharge. Negative for dysuria and vaginal bleeding.   As noted in HPI  Physical Exam Vital Signs  I have reviewed the triage vital signs BP (!) 158/63 (BP Location: Left Arm)   Pulse 80   Temp 98.4 F (36.9 C) (Oral)   Resp 16   Ht 5\' 4"  (1.626 m)   Wt 113.4 kg   SpO2 96%   BMI 42.91 kg/m   Physical Exam Vitals reviewed. Exam conducted with a chaperone present.  Constitutional:      General: She is not in acute distress.    Appearance: She is well-developed. She is obese. She is not diaphoretic.  HENT:     Head: Normocephalic and atraumatic.     Right Ear: External ear normal.     Left Ear: External ear normal.     Nose: Nose normal.  Eyes:     General: No scleral icterus.    Conjunctiva/sclera: Conjunctivae normal.  Neck:     Trachea: Phonation normal.  Cardiovascular:     Rate and Rhythm: Normal rate and regular rhythm.  Pulmonary:     Effort: Pulmonary effort is normal. No respiratory distress.     Breath sounds: No stridor.  Abdominal:     General: There is no distension.     Tenderness: There is no abdominal tenderness.  Genitourinary:    Pubic Area: Rash present.     Labia:        Right: Rash present. No lesion.        Left: Rash present. No lesion.      Vagina: Vaginal discharge present. No erythema or bleeding.     Cervix: No cervical  motion tenderness, discharge, friability, lesion or cervical bleeding.  Musculoskeletal:  General: Normal range of motion.     Cervical back: Normal range of motion.  Neurological:     Mental Status: She is alert and oriented to person, place, and time.  Psychiatric:        Behavior: Behavior normal.     ED Results and Treatments Labs (all labs ordered are listed, but only abnormal results are displayed) Labs Reviewed  WET PREP, GENITAL - Abnormal; Notable for the following components:      Result Value   WBC, Wet Prep HPF POC >=10 (*)    All other components within normal limits  COMPREHENSIVE METABOLIC PANEL WITH GFR - Abnormal; Notable for the following components:   Sodium 131 (*)    Chloride 97 (*)    Glucose, Bld 241 (*)    All other components within normal limits  CBC WITH DIFFERENTIAL/PLATELET - Abnormal; Notable for the following components:   WBC 11.9 (*)    Hemoglobin 11.3 (*)    HCT 35.6 (*)    RDW 15.9 (*)    Lymphs Abs 4.5 (*)    All other components within normal limits  URINALYSIS, W/ REFLEX TO CULTURE (INFECTION SUSPECTED) - Abnormal; Notable for the following components:   APPearance HAZY (*)    Protein, ur 30 (*)    Nitrite POSITIVE (*)    Leukocytes,Ua SMALL (*)    Bacteria, UA MANY (*)    All other components within normal limits  URINE CULTURE  LIPASE, BLOOD  PREGNANCY, URINE  HCG, SERUM, QUALITATIVE  GC/CHLAMYDIA PROBE AMP ([place]) NOT AT [facility]                                                                                                                         EKG  EKG Interpretation Date/Time:    Ventricular Rate:    PR Interval:    QRS Duration:    QT Interval:    QTC Calculation:   R Axis:      Text Interpretation:         Radiology No results found.  Medications Ordered in ED Medications - No data to display Procedures Procedures  (including critical care time) Medical Decision Making / ED Course   Medical  Decision Making Amount and/or Complexity of Data Reviewed Labs: ordered. Decision-making details documented in ED Course.  Risk Prescription drug management.    Pelvic pain with vaginal discharge. Exam with mild discharge. No cervicitis appreciated on exam.  Evidence of tinea cruris Abd nontender  CBC with leukocytosis. UA consistent UTI. - not septic Wet prep negative for yeast, Trich, BV. Will await GC/Chlam. UPT negative ruling out pregnancy related process.     Final Clinical Impression(s) / ED Diagnoses Final diagnoses:  Lower urinary tract infectious disease   The patient appears reasonably screened and/or stabilized for discharge and I doubt any other medical condition or other [facility] requiring further screening, evaluation, or treatment in the ED at this time. I have discussed the findings, Dx  and Tx plan with the patient/family who expressed understanding and agree(s) with the plan. Discharge instructions discussed at length. The patient/family was given strict return precautions who verbalized understanding of the instructions. No further questions at time of discharge.  Disposition: Discharge  Condition: Good  ED Discharge Orders          Ordered    cefadroxil (DURICEF) 500 MG capsule  2 times daily        08/20/23 0457    clotrimazole  (LOTRIMIN ) 1 % cream        08/20/23 0457             Follow Up: Tretha Fu, MD [address] [place] [address] [phone]       This chart was dictated using voice recognition software.  Despite best efforts to proofread,  errors can occur which can change the documentation meaning.    Lindle Rhea, MD 08/20/23 [phone]

## 2023-08-20 NOTE — ED Notes (Signed)
Pt aware of need for urine specimen. 

## 2023-08-22 LAB — GC/CHLAMYDIA PROBE AMP (~~LOC~~) NOT AT ARMC
Chlamydia: NEGATIVE
Comment: NEGATIVE
Comment: NORMAL
Neisseria Gonorrhea: NEGATIVE

## 2023-08-22 LAB — URINE CULTURE: Culture: >=100000 — AB

## 2023-08-23 ENCOUNTER — Telehealth (HOSPITAL_BASED_OUTPATIENT_CLINIC_OR_DEPARTMENT_OTHER): Payer: Self-pay | Admitting: *Deleted

## 2023-08-23 NOTE — Telephone Encounter (Signed)
 Post ED Visit - Positive Culture Follow-up  Culture report reviewed by antimicrobial stewardship pharmacist: [facility] []  Court Distance, Pharm.D. []  Skeet Duke, Pharm.D., BCPS AQ-ID []  Leslee Rase, Pharm.D., BCPS [x]  Garland Junk, [address].D., BCPS []  [place], [place].D., BCPS, AAHIVP []  Alcide Aly, Pharm.D., BCPS, AAHIVP []  Jerri Morale, PharmD, BCPS []  Graham Laws, PharmD, BCPS []  Cleda Curly, PharmD, BCPS []  Tamar Fairly, PharmD []  Ballard Levels, PharmD, BCPS []  Ollen Beverage, PharmD  [facility] []  Arlyne Bering, PharmD []  Sherryle Don, PharmD []  Van [place]elin[place], PharmD []  Delila Felty, Rph []  [place]) Cleora Daft, PharmD []  Augustina Block, PharmD []  Arie Kurtz, PharmD []  Sharlyn Deaner, PharmD []  Agnes Hose, PharmD []  Kendall Pauls, PharmD []  Gladstone Lamer, PharmD []  Armanda Bern, PharmD []  Tera Fellows, PharmD   Positive urine culture Treated with cefadroxil, organism sensitive to the same and no further patient follow-up is required at this time.  Lovell Rubenstein Sharion Davidson 08/23/2023, 11:44 AM

## 2023-08-30 ENCOUNTER — Other Ambulatory Visit: Payer: Self-pay

## 2023-10-26 ENCOUNTER — Emergency Department (HOSPITAL_COMMUNITY)
Admission: EM | Admit: 2023-10-26 | Discharge: 2023-10-26 | Disposition: A | Discharge status: Home / Self Care | Payer: MEDICAID | Attending: Emergency Medicine | Admitting: Emergency Medicine

## 2023-10-26 ENCOUNTER — Emergency Department (HOSPITAL_COMMUNITY): Payer: MEDICAID

## 2023-10-26 ENCOUNTER — Encounter (HOSPITAL_COMMUNITY): Payer: Self-pay

## 2023-10-26 ENCOUNTER — Other Ambulatory Visit: Payer: Self-pay

## 2023-10-26 DIAGNOSIS — E119 Type 2 diabetes mellitus without complications: Secondary | ICD-10-CM

## 2023-10-26 DIAGNOSIS — R739 Hyperglycemia, unspecified: Secondary | ICD-10-CM | POA: Diagnosis not present

## 2023-10-26 DIAGNOSIS — R110 Nausea: Secondary | ICD-10-CM | POA: Diagnosis not present

## 2023-10-26 DIAGNOSIS — R42 Dizziness and giddiness: Secondary | ICD-10-CM | POA: Diagnosis present

## 2023-10-26 DIAGNOSIS — D72829 Elevated white blood cell count, unspecified: Secondary | ICD-10-CM | POA: Diagnosis not present

## 2023-10-26 DIAGNOSIS — E876 Hypokalemia: Secondary | ICD-10-CM | POA: Insufficient documentation

## 2023-10-26 DIAGNOSIS — R0789 Other chest pain: Secondary | ICD-10-CM | POA: Insufficient documentation

## 2023-10-26 LAB — URINALYSIS, ROUTINE W REFLEX MICROSCOPIC
Bacteria, UA: NONE SEEN
Bilirubin Urine: NEGATIVE
Glucose, UA: >=500 mg/dL — AB
Hgb urine dipstick: NEGATIVE
Ketones, ur: 20 mg/dL — AB
Leukocytes,Ua: NEGATIVE
Nitrite: NEGATIVE
Protein, ur: 30 mg/dL — AB
Specific Gravity, Urine: 1.036 — ABNORMAL HIGH (ref 1.005–1.030)
pH: 5 (ref 5.0–8.0)

## 2023-10-26 LAB — CBC
HCT: 35.5 % — ABNORMAL LOW (ref 36.0–46.0)
Hemoglobin: 11.4 g/dL — ABNORMAL LOW (ref 12.0–15.0)
MCH: 27.1 pg (ref 26.0–34.0)
MCHC: 32.1 g/dL (ref 30.0–36.0)
MCV: 84.3 fL (ref 80.0–100.0)
Platelets: 272 K/uL (ref 150–400)
RBC: 4.21 MIL/uL (ref 3.87–5.11)
RDW: 15.7 % — ABNORMAL HIGH (ref 11.5–15.5)
WBC: 13.1 K/uL — ABNORMAL HIGH (ref 4.0–10.5)
nRBC: 0.2 % (ref 0.0–0.2)

## 2023-10-26 LAB — CBG MONITORING, ED
Glucose-Capillary: 308 mg/dL — ABNORMAL HIGH (ref 70–99)
Glucose-Capillary: 294 mg/dL — ABNORMAL HIGH (ref 70–99)
Glucose-Capillary: 320 mg/dL — ABNORMAL HIGH (ref 70–99)

## 2023-10-26 LAB — COMPREHENSIVE METABOLIC PANEL WITH GFR
ALT: 18 U/L (ref 0–44)
AST: 22 U/L (ref 15–41)
Albumin: 4 g/dL (ref 3.5–5.0)
Alkaline Phosphatase: 115 U/L (ref 38–126)
Anion gap: 11 (ref 5–15)
BUN: 12 mg/dL (ref 6–20)
CO2: 22 mmol/L (ref 22–32)
Calcium: 9.5 mg/dL (ref 8.9–10.3)
Chloride: 98 mmol/L (ref 98–111)
Creatinine, Ser: 0.79 mg/dL (ref 0.44–1.00)
GFR, Estimated: >60 mL/min (ref >60)
Glucose, Bld: 326 mg/dL — ABNORMAL HIGH (ref 70–99)
Potassium: 3.2 mmol/L — ABNORMAL LOW (ref 3.5–5.1)
Sodium: 131 mmol/L — ABNORMAL LOW (ref 135–145)
Total Bilirubin: 0.4 mg/dL (ref 0.0–1.2)
Total Protein: 8.5 g/dL — ABNORMAL HIGH (ref 6.5–8.1)

## 2023-10-26 LAB — TROPONIN I (HIGH SENSITIVITY)
Troponin I (High Sensitivity): 9 ng/L (ref <18)
Troponin I (High Sensitivity): 10 ng/L (ref <18)

## 2023-10-26 LAB — D-DIMER, QUANTITATIVE: D-Dimer, Quant: 0.48 ug{FEU}/mL (ref 0.00–0.50)

## 2023-10-26 MED ORDER — METFORMIN HCL 500 MG PO TABS: 500.0000 mg | ORAL_TABLET | Freq: Two times a day (BID) | ORAL | 0 refills | Status: DC

## 2023-10-26 MED ORDER — SODIUM CHLORIDE 0.9 % IV BOLUS
500.0000 mL | Freq: Once | INTRAVENOUS | Status: AC
  Administered 2023-10-26: 500 mL via INTRAVENOUS

## 2023-10-26 MED ORDER — ONDANSETRON HCL 4 MG/2ML IJ SOLN
4.0000 mg | Freq: Once | INTRAMUSCULAR | Status: AC
  Administered 2023-10-26: 4 mg via INTRAVENOUS
  Filled 2023-10-26: qty 2

## 2023-10-26 MED ORDER — POTASSIUM CHLORIDE CRYS ER 20 MEQ PO TBCR
40.0000 meq | EXTENDED_RELEASE_TABLET | Freq: Once | ORAL | Status: AC
  Administered 2023-10-26: 40 meq via ORAL
  Filled 2023-10-26: qty 2

## 2023-10-26 NOTE — ED Triage Notes (Signed)
 Patient arrived by EMS from home and has been dizzy and weak for one week and denies N/V, CP, SHOB.  Patient ambulates without difficulty.

## 2023-10-26 NOTE — ED Provider Notes (Signed)
 [place] EMERGENCY DEPARTMENT AT [facility] Provider Note   CSN: [id number] Arrival date & time: 10/26/23  9951     Patient presents with: Weakness and Dizziness (Denies CP, no SHOB, no N/V)   Britanie Harshman is a 53 y.o. female.   The history is provided by the patient and medical records.  Weakness Associated symptoms: dizziness   Dizziness Associated symptoms: weakness   Melvia Ziare Orrick is a 53 y.o. female who presents to the Emergency Department complaining of weakness and dizziness.  She presents to the emergency department for evaluation of symptoms that started on July 5 where she started to feel unwell with generalized weakness and dizziness.  No significant change in her symptoms with movement and symptoms are constant in nature.  She can walk, but did develop diaphoresis when she walked to her mailbox today.  She feels that she has been overheated.  She developed nausea and chest discomfort while in the emergency department.  She has been experiencing difficulty seeing for the last several months secondary to losing her glasses.  No headache, dysuria.  She does have a history of cardiomegaly, takes no routine medications.     Prior to Admission medications   Medication Sig Start Date End Date Taking? Authorizing Provider  albuterol  (VENTOLIN  HFA) 108 (90 Base) MCG/ACT inhaler Inhale 2 puffs into the lungs every 6 (six) hours as needed for wheezing or shortness of breath. 07/14/22   Rai, Ripudeep K, MD  clotrimazole  (LOTRIMIN ) 1 % cream Apply to affected area 2 times daily 08/20/23   Cardama, Raynell Moder, MD  desvenlafaxine (PRISTIQ) 25 MG 24 hr tablet Take 25 mg by mouth daily.    [provider]  fluticasone  furoate-vilanterol (BREO ELLIPTA ) 100-25 MCG/ACT AEPB Inhale 1 puff into the lungs daily. 07/14/22   Rai, Nydia POUR, MD  furosemide  (LASIX ) 20 MG tablet Take 1 tablet (20 mg total) by mouth daily as needed for fluid or edema (Shortness of  breath). 07/14/22   Rai, Ripudeep POUR, MD  Iron  Polysacch Cmplx-B12-FA 150-0.025-1 MG CAPS Take 1 capsule by mouth daily. 07/14/22   Rai, Nydia POUR, MD  losartan  (COZAAR ) 25 MG tablet Take 1 tablet (25 mg total) by mouth daily. 07/14/22   Rai, Nydia POUR, MD  methocarbamol  (ROBAXIN ) 500 MG tablet Take 1 tablet (500 mg total) by mouth every 8 (eight) hours as needed for muscle spasms. 12/04/22   Theadore Ozell HERO, MD  naproxen  (NAPROSYN ) 500 MG tablet Take 1 tablet (500 mg total) by mouth 2 (two) times daily. 12/04/22   Theadore Ozell HERO, MD  naproxen  sodium (ALEVE ) 220 MG tablet Take 220 mg by mouth daily as needed.    [provider]  OLANZapine  (ZYPREXA ) 10 MG tablet Take 10 mg by mouth 2 (two) times daily.    [provider]  primidone  (MYSOLINE ) 50 MG tablet Take 2 tablets (100 mg total) by mouth 3 (three) times daily. 07/14/22   Rai, Nydia POUR, MD  iron  polysaccharides (NIFEREX) 150 MG capsule Take 150 mg by mouth 2 (two) times daily.  10/01/12  [provider]    Allergies: Latuda  [lurasidone  hcl], Abilify  [aripiprazole ], Flagyl  [metronidazole  hcl], Prolixin  [fluphenazine ], Risperidone  and paliperidone, Seroquel  [quetiapine  fumarate], and Sulfa antibiotics    Review of Systems  Neurological:  Positive for dizziness and weakness.  All other systems reviewed and are negative.   Updated Vital Signs BP (!) 112/52   Pulse 67   Temp 97.9 F (36.6 C) (Oral)  Resp 16   SpO2 93%   Physical Exam Vitals and nursing note reviewed.  Constitutional:      Appearance: She is well-developed.  HENT:     Head: Normocephalic and atraumatic.  Cardiovascular:     Rate and Rhythm: Normal rate and regular rhythm.     Heart sounds: No murmur heard. Pulmonary:     Effort: Pulmonary effort is normal. No respiratory distress.     Breath sounds: Normal breath sounds.  Abdominal:     Palpations: Abdomen is soft.     Tenderness: There is no abdominal tenderness. There is no guarding  or rebound.  Musculoskeletal:        General: No tenderness.  Skin:    General: Skin is warm and dry.  Neurological:     Mental Status: She is alert and oriented to person, place, and time.     Comments: Mild nystagmus on horizontal gaze bilaterally. Visual fields grossly intact.  5/5 strength in all four extremities with sensation to light touch intact in all four extremities.    Psychiatric:        Behavior: Behavior normal.     (all labs ordered are listed, but only abnormal results are displayed) Labs Reviewed  COMPREHENSIVE METABOLIC PANEL WITH GFR - Abnormal; Notable for the following components:      Result Value   Sodium 131 (*)    Potassium 3.2 (*)    Glucose, Bld 326 (*)    Total Protein 8.5 (*)    All other components within normal limits  CBC - Abnormal; Notable for the following components:   WBC 13.1 (*)    Hemoglobin 11.4 (*)    HCT 35.5 (*)    RDW 15.7 (*)    All other components within normal limits  URINALYSIS, ROUTINE W REFLEX MICROSCOPIC - Abnormal; Notable for the following components:   Specific Gravity, Urine 1.036 (*)    Glucose, UA >=500 (*)    Ketones, ur 20 (*)    Protein, ur 30 (*)    All other components within normal limits  CBG MONITORING, ED - Abnormal; Notable for the following components:   Glucose-Capillary 308 (*)    All other components within normal limits  CBG MONITORING, ED - Abnormal; Notable for the following components:   Glucose-Capillary 294 (*)    All other components within normal limits  D-DIMER, QUANTITATIVE (NOT AT [facility])  TROPONIN I (HIGH SENSITIVITY)  TROPONIN I (HIGH SENSITIVITY)    EKG: None  Radiology: CT Head Wo Contrast Result Date: 10/26/2023 EXAM: CT HEAD WITHOUT 10/26/2023 07:16:44 AM TECHNIQUE: CT of the head was performed without the administration of intravenous contrast. Automated exposure control, iterative reconstruction, and/or weight based adjustment of the mA/kV was utilized to reduce the radiation  dose to as low as reasonably achievable. COMPARISON: CT head without contrast 08/29/2020. CLINICAL HISTORY: Headache, increasing frequency or severity; dizziness. dizzy and weak for one week. FINDINGS: BRAIN AND VENTRICLES: No acute intracranial hemorrhage. No mass effect or midline shift. No extra-axial fluid collection. Gray-white differentiation is maintained. No hydrocephalus. ORBITS: No acute abnormality. SINUSES AND MASTOIDS: No acute abnormality. SOFT TISSUES AND SKULL: No acute skull fracture. No acute soft tissue abnormality. IMPRESSION: 1. No acute intracranial abnormality. Electronically signed by: Lonni Necessary MD 10/26/2023 07:21 AM EDT RP Workstation: [id number]   DG Chest Port 1 View Result Date: 10/26/2023 CLINICAL DATA:  Chest pain.  Dizziness and weakness for 1 week. EXAM: PORTABLE CHEST 1 VIEW COMPARISON:  07/13/2022 FINDINGS:  Shallow inspiration. Cardiac enlargement. Linear atelectasis in the left mid lung, developing since prior study. No pleural effusion or pneumothorax. No focal consolidation. Mediastinal contours appear intact. IMPRESSION: Cardiac enlargement. Linear atelectasis in the left mid lung. No focal consolidation. Electronically Signed   By: Elsie Gravely M.D.   On: 10/26/2023 03:44     Procedures   Medications Ordered in the ED  sodium chloride  0.9 % bolus 500 mL (0 mLs Intravenous Stopped 10/26/23 0518)  potassium chloride  SA (KLOR-CON  M) CR tablet 40 mEq (40 mEq Oral Given 10/26/23 0345)  ondansetron  (ZOFRAN ) injection 4 mg (4 mg Intravenous Given 10/26/23 0344)                                    Medical Decision Making Amount and/or Complexity of Data Reviewed Labs: ordered. Radiology: ordered.  Risk Prescription drug management.   Patient here for evaluation of malaise, dizziness and generalized weakness for several days.  She does have mild to nystagmus on examination, no other focal neurologic deficits.  She is able to ambulate with steady gait.   Lab significant for hyponatremia, hypokalemia and hyperglycemia.  She was treated with IV fluids with improvement in her blood sugar.  Discussed findings of hyperglycemia, likely diabetes.  Suspect this may be contributing to her symptoms.  Patient care transferred pending CT head and repeat evaluation.     Final diagnoses:  None    ED Discharge Orders     None          Griselda Norris, MD 10/26/23 [phone]

## 2023-10-26 NOTE — ED Provider Notes (Signed)
  Physical Exam  BP (!) 112/52   Pulse 67   Temp 97.9 F (36.6 C) (Oral)   Resp 16   SpO2 93%   Physical Exam  Procedures  Procedures  ED Course / MDM    Medical Decision Making Amount and/or Complexity of Data Reviewed Labs: ordered. Radiology: ordered.  Risk Prescription drug management.   Received care of pt from Dr. Griselda. pre_DM, generalized weakness.  Persistent dizziness.  Labs consistent with new DM.   Labs were completed and personally eval and interpreted by me show hyperglycemia without signs of DKA, normal troponin, no sign of urinary tract infection, mild hypokalemia and leukocytosis.  CT head completed shows no acute abnormalities.  X-ray of the chest shows cardiac enlargement without any focal consolidation or edema.  EKG shows a normal sinus rhythm without acute ST changes.  Suspect most likely her dizziness is secondary to hyperglycemia new onset of diabetes.  Will add on hemoglobin A1c for purposes of follow-up with her primary care physician and initiate metformin . Discussed diet, exercise, taking metformin  and following up closely. Able to ambulate at this time with steady gait and have low suspicion for CVA. Patient discharged in stable condition with understanding of reasons to return.    Dreama Longs, MD 10/27/23 [phone]

## 2023-11-25 ENCOUNTER — Emergency Department (HOSPITAL_COMMUNITY)
Admission: EM | Admit: 2023-11-25 | Discharge: 2023-11-25 | Disposition: A | Discharge status: Home / Self Care | Payer: MEDICAID

## 2023-11-25 DIAGNOSIS — Z7984 Long term (current) use of oral hypoglycemic drugs: Secondary | ICD-10-CM | POA: Insufficient documentation

## 2023-11-25 DIAGNOSIS — J45909 Unspecified asthma, uncomplicated: Secondary | ICD-10-CM | POA: Diagnosis not present

## 2023-11-25 DIAGNOSIS — E871 Hypo-osmolality and hyponatremia: Secondary | ICD-10-CM | POA: Diagnosis not present

## 2023-11-25 DIAGNOSIS — R739 Hyperglycemia, unspecified: Secondary | ICD-10-CM | POA: Diagnosis present

## 2023-11-25 DIAGNOSIS — R748 Abnormal levels of other serum enzymes: Secondary | ICD-10-CM | POA: Insufficient documentation

## 2023-11-25 DIAGNOSIS — E1165 Type 2 diabetes mellitus with hyperglycemia: Secondary | ICD-10-CM | POA: Diagnosis not present

## 2023-11-25 LAB — COMPREHENSIVE METABOLIC PANEL WITH GFR
ALT: 17 U/L (ref 0–44)
AST: 23 U/L (ref 15–41)
Albumin: 3.9 g/dL (ref 3.5–5.0)
Alkaline Phosphatase: 135 U/L — ABNORMAL HIGH (ref 38–126)
Anion gap: 9 (ref 5–15)
BUN: 12 mg/dL (ref 6–20)
CO2: 24 mmol/L (ref 22–32)
Calcium: 9.4 mg/dL (ref 8.9–10.3)
Chloride: 100 mmol/L (ref 98–111)
Creatinine, Ser: 0.91 mg/dL (ref 0.44–1.00)
GFR, Estimated: >60 mL/min (ref >60)
Glucose, Bld: 322 mg/dL — ABNORMAL HIGH (ref 70–99)
Potassium: 4 mmol/L (ref 3.5–5.1)
Sodium: 133 mmol/L — ABNORMAL LOW (ref 135–145)
Total Bilirubin: 0.2 mg/dL (ref 0.0–1.2)
Total Protein: 9.1 g/dL — ABNORMAL HIGH (ref 6.5–8.1)

## 2023-11-25 LAB — URINALYSIS, ROUTINE W REFLEX MICROSCOPIC
Bilirubin Urine: NEGATIVE
Glucose, UA: >=500 mg/dL — AB
Ketones, ur: 5 mg/dL — AB
Leukocytes,Ua: NEGATIVE
Nitrite: NEGATIVE
Protein, ur: NEGATIVE mg/dL
RBC / HPF: >50 RBC/hpf (ref 0–5)
Specific Gravity, Urine: 1.03 (ref 1.005–1.030)
pH: 5 (ref 5.0–8.0)

## 2023-11-25 LAB — CBC
HCT: 39 % (ref 36.0–46.0)
Hemoglobin: 11.8 g/dL — ABNORMAL LOW (ref 12.0–15.0)
MCH: 26 pg (ref 26.0–34.0)
MCHC: 30.3 g/dL (ref 30.0–36.0)
MCV: 86.1 fL (ref 80.0–100.0)
Platelets: 306 K/uL (ref 150–400)
RBC: 4.53 MIL/uL (ref 3.87–5.11)
RDW: 15.7 % — ABNORMAL HIGH (ref 11.5–15.5)
WBC: 8.1 K/uL (ref 4.0–10.5)
nRBC: 0 % (ref 0.0–0.2)

## 2023-11-25 LAB — CBG MONITORING, ED: Glucose-Capillary: 316 mg/dL — ABNORMAL HIGH (ref 70–99)

## 2023-11-25 MED ORDER — SODIUM CHLORIDE 0.9 % IV BOLUS
1000.0000 mL | Freq: Once | INTRAVENOUS | Status: AC
  Administered 2023-11-25: 1000 mL via INTRAVENOUS

## 2023-11-25 NOTE — ED Triage Notes (Signed)
 Per EMS from home. Hyperglycemia.  76 HR  95 on RA CBG 402

## 2023-11-25 NOTE — ED Provider Notes (Signed)
 [name] EMERGENCY DEPARTMENT AT [facility] Provider Note   CSN: [id number] Arrival date & time: 11/25/23  1513     Patient presents with: Hyperglycemia   [name] is a 53 y.o. female.    Hyperglycemia Associated symptoms: dizziness   Patient is a 53 year old female to the ED today for concerns for hyperglycemia and dizziness which she describes as lightheadedness, denying blurry vision or vertigo that started today.  Reports that she is currently taking glipizide not take her glipizide at her normal time, only taking it 2 hours ago, checking her sugar and noting a blood sugar of 400.  Otherwise not having any other complaints or symptoms at this time.  Previous medical history of Schizoaffective disorder, bipolar type, anxiety disorder, cannabis use dependence, cardiomegaly, anemia requiring transfusions, asthma, type 2 diabetes.  Denies fever, headache, vision changes, chest pain, shortness of breath, cough, abdominal pain, nausea, vomiting, dysuria, lower leg swelling.    Prior to Admission medications   Medication Sig Start Date End Date Taking? Authorizing Provider  albuterol  (VENTOLIN  HFA) 108 (90 Base) MCG/ACT inhaler Inhale 2 puffs into the lungs every 6 (six) hours as needed for wheezing or shortness of breath. 07/14/22   Rai, Ripudeep K, MD  clotrimazole  (LOTRIMIN ) 1 % cream Apply to affected area 2 times daily 08/20/23   Cardama, Raynell Moder, MD  desvenlafaxine (PRISTIQ) 25 MG 24 hr tablet Take 25 mg by mouth daily.    [provider]  fluticasone  furoate-vilanterol (BREO ELLIPTA ) 100-25 MCG/ACT AEPB Inhale 1 puff into the lungs daily. 07/14/22   Rai, Nydia POUR, MD  furosemide  (LASIX ) 20 MG tablet Take 1 tablet (20 mg total) by mouth daily as needed for fluid or edema (Shortness of breath). 07/14/22   Rai, Nydia POUR, MD  Iron  Polysacch Cmplx-B12-FA 150-0.025-1 MG CAPS Take 1 capsule by mouth daily. 07/14/22   Rai, Nydia POUR, MD  losartan   (COZAAR ) 25 MG tablet Take 1 tablet (25 mg total) by mouth daily. 07/14/22   Rai, Nydia POUR, MD  metFORMIN  (GLUCOPHAGE ) 500 MG tablet Take 1 tablet (500 mg total) by mouth 2 (two) times daily with a meal. 10/26/23   Dreama Longs, MD  methocarbamol  (ROBAXIN ) 500 MG tablet Take 1 tablet (500 mg total) by mouth every 8 (eight) hours as needed for muscle spasms. 12/04/22   Theadore Ozell HERO, MD  naproxen  (NAPROSYN ) 500 MG tablet Take 1 tablet (500 mg total) by mouth 2 (two) times daily. 12/04/22   Theadore Ozell HERO, MD  naproxen  sodium (ALEVE ) 220 MG tablet Take 220 mg by mouth daily as needed.    [provider]  OLANZapine  (ZYPREXA ) 10 MG tablet Take 10 mg by mouth 2 (two) times daily.    [provider]  primidone  (MYSOLINE ) 50 MG tablet Take 2 tablets (100 mg total) by mouth 3 (three) times daily. 07/14/22   Rai, Nydia POUR, MD  iron  polysaccharides (NIFEREX) 150 MG capsule Take 150 mg by mouth 2 (two) times daily.  10/01/12  [provider]    Allergies: Latuda  [lurasidone  hcl], Abilify  [aripiprazole ], Flagyl  [metronidazole  hcl], Prolixin  [fluphenazine ], Risperidone  and paliperidone, Seroquel  [quetiapine  fumarate], and Sulfa antibiotics    Review of Systems  Neurological:  Positive for dizziness.  All other systems reviewed and are negative.   Updated Vital Signs SpO2 98%   Physical Exam Vitals and nursing note reviewed.  Constitutional:      General: She is not in acute distress.    Appearance: Normal appearance.  She is not ill-appearing or diaphoretic.  HENT:     Head: Normocephalic and atraumatic.  Eyes:     General: No scleral icterus.       Right eye: No discharge.        Left eye: No discharge.     Extraocular Movements: Extraocular movements intact.     Conjunctiva/sclera: Conjunctivae normal.  Cardiovascular:     Rate and Rhythm: Normal rate and regular rhythm.     Pulses: Normal pulses.     Heart sounds: Normal heart sounds. No murmur heard.    No  friction rub. No gallop.  Pulmonary:     Effort: Pulmonary effort is normal. No respiratory distress.     Breath sounds: No stridor. No wheezing, rhonchi or rales.  Chest:     Chest wall: No tenderness.  Abdominal:     General: Abdomen is flat. There is no distension.     Palpations: Abdomen is soft.     Tenderness: There is no abdominal tenderness. There is no right CVA tenderness, left CVA tenderness, guarding or rebound.  Musculoskeletal:        General: No swelling, deformity or signs of injury.     Cervical back: Normal range of motion. No rigidity.     Right lower leg: No edema.     Left lower leg: No edema.  Skin:    General: Skin is warm and dry.     Findings: No bruising, erythema or lesion.  Neurological:     General: No focal deficit present.     Mental Status: She is alert and oriented to person, place, and time. Mental status is at baseline.     Sensory: No sensory deficit.     Motor: No weakness.     Gait: Gait normal.  Psychiatric:        Mood and Affect: Mood normal.     (all labs ordered are listed, but only abnormal results are displayed) Labs Reviewed  CBC - Abnormal; Notable for the following components:      Result Value   Hemoglobin 11.8 (*)    RDW 15.7 (*)    All other components within normal limits  URINALYSIS, ROUTINE W REFLEX MICROSCOPIC - Abnormal; Notable for the following components:   Glucose, UA >=500 (*)    Hgb urine dipstick LARGE (*)    Ketones, ur 5 (*)    Bacteria, UA FEW (*)    All other components within normal limits  COMPREHENSIVE METABOLIC PANEL WITH GFR - Abnormal; Notable for the following components:   Sodium 133 (*)    Glucose, Bld 322 (*)    Total Protein 9.1 (*)    Alkaline Phosphatase 135 (*)    All other components within normal limits  CBG MONITORING, ED - Abnormal; Notable for the following components:   Glucose-Capillary 316 (*)    All other components within normal limits    EKG: None  Radiology: No results  found. Procedures   Medications Ordered in the ED  sodium chloride  0.9 % bolus 1,000 mL (1,000 mLs Intravenous New Bag/Given 11/25/23 1625)      Medical Decision Making Amount and/or Complexity of Data Reviewed Labs: ordered.   This patient is a 53 year old female who presents to the ED for concern of hyperglycemia and dizziness, similar to previous episodes of hyperglycemia, less intense than past episode.  Previous workup was benign.  Currently taking glipizide but did not take it until approximate 2 hours ago.  Noted an  elevated blood sugar at home and felt similar dizziness that she had felt in the past and decided to come into the emergency department.  Currently asymptomatic and not experiencing other symptoms or concerns at this time.  On physical exam, patient is in no acute distress, afebrile, alert and orient x 4, speaking in full sentences, nontachypneic, nontachycardic.  LCTAB, RRR, no murmur, no lower leg edema.  No abdominal tenderness to palpation, no CVA tenderness.  Normal neuroexam.  Unremarkable exam otherwise.  With patient's blood sugar notably elevated, started her on 1 L of fluid with pending labs.  With patient's similar presentation of dizziness per her last elevated blood sugar and no abnormal findings at that workup, do not believe it is warranted at this time.  Will reassess after providing fluids. Blood sugar noted to be elevated at 316 with small amount of ketones noted in the urine.  However with no anion gap and no other signs or symptoms of DKA, low suspicion for this at this time.  Will have her continue to alter her activity to increase exercise as well as better management of blood sugar and follow-up with her primary care.  On reevaluation, patient is notably feeling significantly better with abating symptoms.  Patient vital signs have remained stable throughout the course of patient's time in the ED. Low suspicion for any other emergent pathology at this  time. I believe this patient is safe to be discharged. Provided strict return to ER precautions. Patient expressed agreement and understanding of plan. All questions were answered.   Differential diagnoses prior to evaluation: The emergent differential diagnosis includes, but is not limited to,  DKA, hyperglycemia, electrolyte disturbance,  . This is not an exhaustive differential.   Past Medical History / Co-morbidities / Social History: Schizoaffective disorder, bipolar type, anxiety disorder, cannabis use dependence, cardiomegaly, anemia requiring transfusions, asthma, type 2 diabetes  Additional history: Chart reviewed. Pertinent results include:   Last seen by PCP on 11/16/2023.,  Stopping metformin  because it made her sick and starting glipizide.  Last seen in the emergency department 10/26/2023 for hyperglycemia, no signs of DKA at that time.  Suspect dizziness likely secondary to hypoglycemia with new onset diabetes.  Starting metformin  and following up closely.  Lab Tests/Imaging studies: I personally interpreted labs/imaging and the pertinent results include:    CBC notes a mildly decreased hemoglobin 11.8 otherwise unremarkable CMP notes a mild hyponatremia, elevated sugar of 322, elevated alk phos of 135 but otherwise unremarkable UA does note some protein as well as some hemoglobin here urine but otherwise unremarkable.   Medications: I ordered medication including NS.  I have reviewed the patients home medicines and have made adjustments as needed.  Critical Interventions: None  Social Determinants of Health: Has good follow-up with PCP  Disposition: After consideration of the diagnostic results and the patients response to treatment, I feel that the patient would benefit from discharge and show as above.   emergency department workup does not suggest an emergent condition requiring admission or immediate intervention beyond what has been performed at this time. The plan  is: Follow-up with PCP, continue current blood sugar management, further exercise,. The patient is safe for discharge and has been instructed to return immediately for worsening symptoms, change in symptoms or any other concerns.    Final diagnoses:  Hyperglycemia    ED Discharge Orders     None          Beola Terrall RAMAN, [place] 11/25/23 1830  Neysa Caron PARAS, DO 11/25/23 2312

## 2023-11-25 NOTE — Discharge Instructions (Addendum)
 You are seen today for hyperglycemia.  Your blood sugar today and labs and physical exam are reassuring.  Sugars were elevated, however no signs of DKA or any emergent cause of your symptoms today.  Recommend you continue take your diabetic medication, glipizide regularly and on time as well as continue to monitor your blood sugars at home.  Also recommend continue to walk for at least 20 minutes after eating to help also better control blood sugars.  Please follow-up with your primary care for further management of your blood sugar.  Return to the ED for any new or worsening symptoms.

## 2023-11-27 ENCOUNTER — Encounter (HOSPITAL_COMMUNITY): Payer: Self-pay | Admitting: Emergency Medicine

## 2023-11-27 ENCOUNTER — Other Ambulatory Visit: Payer: Self-pay

## 2023-11-27 ENCOUNTER — Emergency Department (HOSPITAL_COMMUNITY)
Admission: EM | Admit: 2023-11-27 | Discharge: 2023-11-27 | Disposition: A | Discharge status: Home / Self Care | Payer: MEDICAID | Attending: Emergency Medicine | Admitting: Emergency Medicine

## 2023-11-27 DIAGNOSIS — R739 Hyperglycemia, unspecified: Secondary | ICD-10-CM | POA: Insufficient documentation

## 2023-11-27 LAB — I-STAT CHEM 8, ED
BUN: 8 mg/dL (ref 6–20)
Calcium, Ion: 1.25 mmol/L (ref 1.15–1.40)
Chloride: 99 mmol/L (ref 98–111)
Creatinine, Ser: 0.6 mg/dL (ref 0.44–1.00)
Glucose, Bld: 422 mg/dL — ABNORMAL HIGH (ref 70–99)
HCT: 34 % — ABNORMAL LOW (ref 36.0–46.0)
Hemoglobin: 11.6 g/dL — ABNORMAL LOW (ref 12.0–15.0)
Potassium: 3.6 mmol/L (ref 3.5–5.1)
Sodium: 135 mmol/L (ref 135–145)
TCO2: 25 mmol/L (ref 22–32)

## 2023-11-27 LAB — CBC
HCT: 32 % — ABNORMAL LOW (ref 36.0–46.0)
Hemoglobin: 9.9 g/dL — ABNORMAL LOW (ref 12.0–15.0)
MCH: 26.5 pg (ref 26.0–34.0)
MCHC: 30.9 g/dL (ref 30.0–36.0)
MCV: 85.8 fL (ref 80.0–100.0)
Platelets: 303 K/uL (ref 150–400)
RBC: 3.73 MIL/uL — ABNORMAL LOW (ref 3.87–5.11)
RDW: 15.6 % — ABNORMAL HIGH (ref 11.5–15.5)
WBC: 8.3 K/uL (ref 4.0–10.5)
nRBC: 0 % (ref 0.0–0.2)

## 2023-11-27 LAB — CBG MONITORING, ED
Glucose-Capillary: 397 mg/dL — ABNORMAL HIGH (ref 70–99)
Glucose-Capillary: 271 mg/dL — ABNORMAL HIGH (ref 70–99)

## 2023-11-27 MED ORDER — SODIUM CHLORIDE 0.9 % IV BOLUS
1000.0000 mL | Freq: Once | INTRAVENOUS | Status: AC
  Administered 2023-11-27: 1000 mL via INTRAVENOUS

## 2023-11-27 NOTE — ED Provider Notes (Signed)
 [facility] EMERGENCY DEPARTMENT AT [facility] Provider Note   CSN: [id number] Arrival date & time: 11/27/23  1052     Patient presents with: Hyperglycemia   [name] is a 53 y.o. female.   53 year old female presents today with EMS for concern of elevated blood sugars.  She states she is taking her glipizide as she is prescribed.  She is not on any insulin.  She states this morning she noticed her blood sugar was over 500.  She does endorse having some cookies, a [place] sandwich, and some lemonade which she believes does not have that much sugar.  It is a [place] pain.  She denies any abdominal pain, emesis, or any other complaint.  Endorses having to go to urinate frequently.  The history is provided by the patient. No language interpreter was used.       Prior to Admission medications   Medication Sig Start Date End Date Taking? Authorizing Provider  albuterol  (VENTOLIN  HFA) 108 (90 Base) MCG/ACT inhaler Inhale 2 puffs into the lungs every 6 (six) hours as needed for wheezing or shortness of breath. 07/14/22   Rai, Nydia POUR, MD  clotrimazole  (LOTRIMIN ) 1 % cream Apply to affected area 2 times daily 08/20/23   Cardama, Raynell Moder, MD  desvenlafaxine (PRISTIQ) 25 MG 24 hr tablet Take 25 mg by mouth daily.    [provider]  fluticasone  furoate-vilanterol (BREO ELLIPTA ) 100-25 MCG/ACT AEPB Inhale 1 puff into the lungs daily. 07/14/22   Rai, Nydia POUR, MD  furosemide  (LASIX ) 20 MG tablet Take 1 tablet (20 mg total) by mouth daily as needed for fluid or edema (Shortness of breath). 07/14/22   Rai, Nydia POUR, MD  Iron  Polysacch Cmplx-B12-FA 150-0.025-1 MG CAPS Take 1 capsule by mouth daily. 07/14/22   Rai, Nydia POUR, MD  losartan  (COZAAR ) 25 MG tablet Take 1 tablet (25 mg total) by mouth daily. 07/14/22   Rai, Nydia POUR, MD  metFORMIN  (GLUCOPHAGE ) 500 MG tablet Take 1 tablet (500 mg total) by mouth 2 (two) times daily with a meal. 10/26/23   Dreama Longs, MD  methocarbamol  (ROBAXIN ) 500 MG tablet Take 1 tablet (500 mg total) by mouth every 8 (eight) hours as needed for muscle spasms. 12/04/22   Theadore Ozell HERO, MD  naproxen  (NAPROSYN ) 500 MG tablet Take 1 tablet (500 mg total) by mouth 2 (two) times daily. 12/04/22   Theadore Ozell HERO, MD  naproxen  sodium (ALEVE ) 220 MG tablet Take 220 mg by mouth daily as needed.    [provider]  OLANZapine  (ZYPREXA ) 10 MG tablet Take 10 mg by mouth 2 (two) times daily.    [provider]  primidone  (MYSOLINE ) 50 MG tablet Take 2 tablets (100 mg total) by mouth 3 (three) times daily. 07/14/22   Rai, Nydia POUR, MD  iron  polysaccharides (NIFEREX) 150 MG capsule Take 150 mg by mouth 2 (two) times daily.  10/01/12  [provider]    Allergies: Latuda  [lurasidone  hcl], Abilify  [aripiprazole ], Flagyl  [metronidazole  hcl], Prolixin  [fluphenazine ], Risperidone  and paliperidone, Seroquel  [quetiapine  fumarate], and Sulfa antibiotics    Review of Systems  Constitutional:  Negative for chills and fever.  Respiratory:  Negative for shortness of breath.   Cardiovascular:  Negative for chest pain.  Gastrointestinal:  Negative for abdominal pain and nausea.  Genitourinary:  Positive for frequency.  Neurological:  Negative for light-headedness.  All other systems reviewed and are negative.   Updated Vital Signs BP 126/68 (BP Location: Right  Arm)   Pulse 73   Temp 98.3 F (36.8 C) (Oral)   Resp 20   Ht 5' 4 (1.626 m)   Wt 117.9 kg   SpO2 98%   BMI 44.63 kg/m   Physical Exam Vitals and nursing note reviewed.  Constitutional:      General: She is not in acute distress.    Appearance: Normal appearance. She is not ill-appearing.  HENT:     Head: Normocephalic and atraumatic.     Nose: Nose normal.  Eyes:     Conjunctiva/sclera: Conjunctivae normal.  Cardiovascular:     Rate and Rhythm: Normal rate and regular rhythm.  Pulmonary:     Effort: Pulmonary effort is normal. No  respiratory distress.  Abdominal:     General: There is no distension.     Palpations: Abdomen is soft.     Tenderness: There is no abdominal tenderness. There is no guarding.  Musculoskeletal:        General: No deformity. Normal range of motion.     Cervical back: Normal range of motion.  Skin:    Findings: No rash.  Neurological:     Mental Status: She is alert.     (all labs ordered are listed, but only abnormal results are displayed) Labs Reviewed  CBC - Abnormal; Notable for the following components:      Result Value   RBC 3.73 (*)    Hemoglobin 9.9 (*)    HCT 32.0 (*)    RDW 15.6 (*)    All other components within normal limits  CBG MONITORING, ED - Abnormal; Notable for the following components:   Glucose-Capillary 397 (*)    All other components within normal limits  I-STAT CHEM 8, ED - Abnormal; Notable for the following components:   Glucose, Bld 422 (*)    Hemoglobin 11.6 (*)    HCT 34.0 (*)    All other components within normal limits  URINALYSIS, ROUTINE W REFLEX MICROSCOPIC    EKG: None  Radiology: No results found.   Procedures   Medications Ordered in the ED  sodium chloride  0.9 % bolus 1,000 mL (1,000 mLs Intravenous New Bag/Given 11/27/23 1138)                                    Medical Decision Making Amount and/or Complexity of Data Reviewed Labs: ordered.   53 year old female presents today for concern of elevated blood sugars.  This appears to be an issue for her over the past month.  This is her third visit for similar issue.  She was most recently seen 2 days ago.  Has not followed up with her PCP.  Denies any complaints.  Workup without evidence of DKA or HHS. CBC without leukocytosis.  Hemoglobin of 9.9 but hemodynamically stable and no signs of acute GI bleed.  Metabolic panel which was an i-STAT shows no acute concerns.  Had a glucose of 422.  After fluids this has improved to 271.  EKG without acute ischemic change. Due to  recurrent visits I did discuss with my attending if there would be any utility in checking an A1c to see just how bad her underlying diabetes is to see if she warrants admission for better control.  However since she does not have any signs of HHS or DKA or other emergent complication of her hypoglycemia was determined that she would benefit from close PCP follow-up.  Patient states she should be able to set this up. She is otherwise stable for discharge. Repeat CBG improved to 271. Discharged in stable condition.  Return precautions discussed. She is compliant with her glipizide.  Final diagnoses:  Hyperglycemia    ED Discharge Orders     None          Hildegard Loge, PA-C 11/27/23 1536    Charlyn Sora, MD 11/27/23 1807

## 2023-11-27 NOTE — ED Triage Notes (Signed)
 Pt arrived via [facility] from home c/o high blood sugar. Pt reports taking blood sugar at home and says it was over 500. Not prescribed insulin only taking glipizide. Woke up feeling fatigued. AxO x4. No nausea or vomiting.  EMS Vitals  146/88 74 HR 527 CBG

## 2023-11-27 NOTE — ED Notes (Signed)
Pt ambulated to the bathroom. Tolerated well.

## 2023-11-27 NOTE — Discharge Instructions (Addendum)
 Continue taking your glipizide.  Please be careful of what you eat.  Limit your carbs and sugars.  Please follow-up with your primary care doctor.  It is important that you follow-up with them as soon as possible.  They may need to modify your blood sugar regimen.

## 2023-12-14 ENCOUNTER — Encounter (HOSPITAL_COMMUNITY): Payer: Self-pay | Admitting: Emergency Medicine

## 2023-12-14 ENCOUNTER — Emergency Department (HOSPITAL_COMMUNITY)
Admission: EM | Admit: 2023-12-14 | Discharge: 2023-12-15 | Disposition: A | Discharge status: Other Acute Inpatient Hospital | Payer: MEDICAID | Attending: Emergency Medicine | Admitting: Emergency Medicine

## 2023-12-14 ENCOUNTER — Other Ambulatory Visit: Payer: Self-pay

## 2023-12-14 DIAGNOSIS — R440 Auditory hallucinations: Secondary | ICD-10-CM | POA: Insufficient documentation

## 2023-12-14 DIAGNOSIS — E1165 Type 2 diabetes mellitus with hyperglycemia: Secondary | ICD-10-CM | POA: Diagnosis not present

## 2023-12-14 DIAGNOSIS — F259 Schizoaffective disorder, unspecified: Secondary | ICD-10-CM

## 2023-12-14 DIAGNOSIS — F250 Schizoaffective disorder, bipolar type: Secondary | ICD-10-CM | POA: Diagnosis present

## 2023-12-14 DIAGNOSIS — Z7984 Long term (current) use of oral hypoglycemic drugs: Secondary | ICD-10-CM | POA: Insufficient documentation

## 2023-12-14 DIAGNOSIS — R739 Hyperglycemia, unspecified: Secondary | ICD-10-CM

## 2023-12-14 LAB — COMPREHENSIVE METABOLIC PANEL WITH GFR
ALT: 23 U/L (ref 0–44)
AST: 43 U/L — ABNORMAL HIGH (ref 15–41)
Albumin: 4.2 g/dL (ref 3.5–5.0)
Alkaline Phosphatase: 126 U/L (ref 38–126)
Anion gap: 16 — ABNORMAL HIGH (ref 5–15)
BUN: 6 mg/dL (ref 6–20)
CO2: 22 mmol/L (ref 22–32)
Calcium: 9.6 mg/dL (ref 8.9–10.3)
Chloride: 100 mmol/L (ref 98–111)
Creatinine, Ser: 0.91 mg/dL (ref 0.44–1.00)
GFR, Estimated: >60 mL/min (ref >60)
Glucose, Bld: 300 mg/dL — ABNORMAL HIGH (ref 70–99)
Potassium: 3.7 mmol/L (ref 3.5–5.1)
Sodium: 137 mmol/L (ref 135–145)
Total Bilirubin: 0.3 mg/dL (ref 0.0–1.2)
Total Protein: 8.1 g/dL (ref 6.5–8.1)

## 2023-12-14 LAB — CBC
HCT: 35.7 % — ABNORMAL LOW (ref 36.0–46.0)
Hemoglobin: 11.2 g/dL — ABNORMAL LOW (ref 12.0–15.0)
MCH: 27.1 pg (ref 26.0–34.0)
MCHC: 31.4 g/dL (ref 30.0–36.0)
MCV: 86.2 fL (ref 80.0–100.0)
Platelets: 322 K/uL (ref 150–400)
RBC: 4.14 MIL/uL (ref 3.87–5.11)
RDW: 15.2 % (ref 11.5–15.5)
WBC: 8.9 K/uL (ref 4.0–10.5)
nRBC: 0 % (ref 0.0–0.2)

## 2023-12-14 LAB — URINE DRUG SCREEN
Amphetamines: NEGATIVE
Barbiturates: NEGATIVE
Benzodiazepines: NEGATIVE
Cocaine: NEGATIVE
Fentanyl: NEGATIVE
Methadone Scn, Ur: NEGATIVE
Opiates: NEGATIVE
Tetrahydrocannabinol: POSITIVE — AB

## 2023-12-14 LAB — ETHANOL: Alcohol, Ethyl (B): <15 mg/dL (ref <15)

## 2023-12-14 MED ORDER — NAPROXEN 500 MG PO TABS
500.0000 mg | ORAL_TABLET | Freq: Two times a day (BID) | ORAL | Status: DC | PRN
  Administered 2023-12-14: 500 mg via ORAL
  Filled 2023-12-14: qty 1

## 2023-12-14 MED ORDER — VENLAFAXINE HCL ER 37.5 MG PO CP24: 37.5000 mg | ORAL_CAPSULE | Freq: Every day | ORAL | Status: DC

## 2023-12-14 MED ORDER — METFORMIN HCL 500 MG PO TABS
500.0000 mg | ORAL_TABLET | Freq: Two times a day (BID) | ORAL | Status: DC
  Filled 2023-12-14: qty 1

## 2023-12-14 MED ORDER — OLANZAPINE 10 MG PO TABS
10.0000 mg | ORAL_TABLET | Freq: Two times a day (BID) | ORAL | Status: DC
  Administered 2023-12-14: 10 mg via ORAL
  Filled 2023-12-14: qty 1

## 2023-12-14 MED ORDER — LOSARTAN POTASSIUM 25 MG PO TABS
25.0000 mg | ORAL_TABLET | Freq: Every day | ORAL | Status: DC
  Administered 2023-12-14: 25 mg via ORAL
  Filled 2023-12-14: qty 1

## 2023-12-14 NOTE — Consult Note (Signed)
 [facility] Psychiatric Consult Initial  [name] Name: .[name]  MRN: [id number]  DOB: [birth year]  Consult Order details:  Orders (From admission, onward)     Start     Ordered   12/14/23 1635  CONSULT TO CALL ACT TEAM       Ordering Provider: Desiderio Chew, PA-C  Provider:  (Not yet assigned)  Question:  Reason for Consult?  Answer:  auditory hallucinations   12/14/23 1634             Mode of Visit: In person    Psychiatry Consult Evaluation  Service Date: December 14, 2023 LOS:  LOS: 0 days  Chief Complaint voices and psychosis  Primary Psychiatric Diagnoses  Schizoaffective disorder   Assessment  [name] is a 52 y.o. female admitted: Presented to the EDfor 12/14/2023  2:24 PM for brought in by [facility] complaining of hallucinations. She carries the psychiatric diagnoses of schizophrenia, schizoaffective disorder, bipolar disorder, anxiety disorder and cannabis abuse and has a past medical history of Type 2 diabetes, osteoarthritis of right knee and obesity.   Her current presentation of hallucinations is most consistent with decompensated schizoaffective disorder. She meets criteria for inpatinet hospitalization based on acute psychosis.  Current outpatient psychotropic medications include zyprexa  and historically she has had a positive response to these medications. She was compliant with medications prior to admission as evidenced by [name] report. On initial examination, [name] was cooperative and requesting hospitalization for stabilization. Please see plan below for detailed recommendations.   Diagnoses:  Active Hospital problems: Principal Problem:   Schizoaffective disorder, bipolar type (HCC)    Plan   ## Psychiatric Medication Recommendations:  --zyprexa  10mg  PO BID  ## Medical Decision Making Capacity: Not specifically addressed in this encounter  ## Further Work-up:  -- most recent EKG on 11/27/2023 had QtC of 419; updated EKG  pending -- Pertinent labwork reviewed earlier this admission includes: CBC, CMP, alcohol and UDS   ## Disposition:-- We recommend inpatient psychiatric hospitalization when medically cleared.  [name] is voluntary; she does not meet criteria for IVC.   ## Behavioral / Environmental: - No specific recommendations at this time.     ## Safety and Observation Level:  - Based on my clinical evaluation, I estimate the [name] to be at low risk of self harm in the current setting. - At this time, we recommend  routine. This decision is based on my review of the chart including [name]'s history and current presentation, interview of the [name], mental status examination, and consideration of suicide risk including evaluating suicidal ideation, plan, intent, suicidal or self-harm behaviors, risk factors, and protective factors. This judgment is based on our ability to directly address suicide risk, implement suicide prevention strategies, and develop a safety plan while the [name] is in the clinical setting. Please contact our team if there is a concern that risk level has changed.  CSSR Risk Category:C-SSRS RISK CATEGORY: No Risk  Suicide Risk Assessment: [name] has following modifiable risk factors for suicide: active mental illness (to encompass adhd, tbi, mania, psychosis, trauma reaction), which we are addressing by recommending inpatient psychiatric hospitalization. [name] has following non-modifiable or demographic risk factors for suicide: psychiatric hospitalization [name] has the following protective factors against suicide: Access to outpatient mental health care  Thank you for this consult request. Recommendations have been communicated to the primary team.  We will continue to follow at this time.   Patricia DELENA Patient, NP       History of  Present Illness  Relevant Aspects of Hospital ED Course:  Admitted on 12/14/2023 for brought in by [facility] complaining of hallucinations. She carries the  psychiatric diagnoses of schizophrenia, shcizoaffective disorder, bipolar disorder, anxiety disorder and cannabis abuse and has a past medical history of Type 2 diabetes, osteoarthritis of right knee and obesity.    [name] Report:  [name], is seen face to face by this provider, consulted with Dr. Larina; and chart reviewed on 12/14/23.  On evaluation [name] reports she is experiencing hallucinations.  [name] requests an inpatient hospitalization for stabilization.  She reports she is taking her medications, but they are not working.    During evaluation [name] is laying on a stretcher in mild distress.  She is alert & oriented x 3, calm, cooperative and attentive for this assessment.  Her mood is anxious with congruent affect.  She has normal speech, and behavior.  Objectively there is evidence of psychosis/mania or delusional thinking. Pt does not appear to be responding to internal or external stimuli.  [name] is able to converse coherently.  She denies suicidal/self-harm/homicidal ideation; she endorses psychosis and paranoia.  [name] answered questions appropriately.    I personally spent a total of 60 minutes in the care of the [name] today including preparing to see the [name], getting/reviewing separately obtained history, performing a medically appropriate exam/evaluation, counseling and educating, placing orders, referring and communicating with other health care professionals, documenting clinical information in the EHR, independently interpreting results, and coordinating care.  Psych ROS:  Depression: endorses Anxiety:  endorses Mania (lifetime and current): endorses Psychosis: (lifetime and current): endorses  Review of Systems  Psychiatric/Behavioral:  Positive for hallucinations and substance abuse.   All other systems reviewed and are negative.    Psychiatric and Social History  Psychiatric History:  Information collected from [name] and  chart review  Prev Dx/Sx: chizophrenia, schizoaffective disorder, bipolar disorder, anxiety disorder and cannabis abuse Current Psych Provider: unknown Home Meds (current): zyprexa   Previous Med Trials: seroquel , abilify , risperdone, latuda  and prolixin  Therapy: unknown  Prior Psych Hospitalization: yes  Prior Self Harm: denies Prior Violence: denies  Family Psych History: none Family Hx suicide: none  Social History:  Developmental Hx: WNL Educational Hx: unknown Occupational Hx: disability Legal Hx: unknown Living Situation: lives alone Spiritual Hx: unknown Access to weapons/lethal means: denies   Substance History Alcohol: denies  Tobacco: endorses Illicit drugs: Marijuana  Prescription drug abuse: denies Rehab hx: denies  Exam Findings  Physical Exam:  Vital Signs:  Temp:  [98 F (36.7 C)] 98 F (36.7 C) (08/27 1408) Pulse Rate:  [88] 88 (08/27 1408) Resp:  [16] 16 (08/27 1408) BP: (171)/(82) 171/82 (08/27 1408) SpO2:  [100 %] 100 % (08/27 1408) Blood pressure (!) 171/82, pulse 88, temperature 98 F (36.7 C), resp. rate 16, SpO2 100%. There is no height or weight on file to calculate BMI.  Physical Exam Constitutional:      Appearance: She is obese.  Eyes:     Pupils: Pupils are equal, round, and reactive to light.  Pulmonary:     Effort: Pulmonary effort is normal.  Skin:    General: Skin is dry.  Neurological:     Mental Status: She is alert and oriented to person, place, and time.  Psychiatric:        Attention and Perception: She perceives auditory and visual hallucinations.        Mood and Affect: Mood is anxious.  Speech: Speech normal.        Behavior: Behavior is actively hallucinating. Behavior is cooperative.        Cognition and Memory: Cognition and memory normal.     Mental Status Exam: General Appearance: Disheveled  Orientation:  Full (Time, Place, and Person)  Memory:  Immediate;   Fair Recent;   Fair Remote;   Fair   Concentration:  Concentration: Fair  Recall:  Fair  Attention  Fair  Eye Contact:  Good  Speech:  Clear and Coherent  Language:  Fair  Volume:  Normal  Mood: anxious  Affect:  Congruent  Thought Process:  Goal Directed  Thought Content:  Hallucinations: Auditory Visual  Suicidal Thoughts:  No  Homicidal Thoughts:  No  Judgement:  Intact  Insight:  Shallow  Psychomotor Activity:  Normal  Akathisia:  No  Fund of Knowledge:  Fair      Assets:  Desire for Improvement Leisure Time  Cognition:  WNL  ADL's:  Intact  AIMS (if indicated):        Other History   These have been pulled in through the EMR, reviewed, and updated if appropriate.  Family History:  The [name]'s family history includes Diabetes type II in an other family member; Hypertension in her father and mother; Stomach cancer in her cousin.  Medical History: Past Medical History:  Diagnosis Date   Arthritis    Asthma    Bipolar 1 disorder (HCC)    Bronchitis    Depression    Hemorrhoid    Schizoaffective disorder (HCC)    Schizophrenia (HCC)    Pt denies and reports it is schizoaffective disorder.    Transfusion history     Surgical History: Past Surgical History:  Procedure Laterality Date   Arm surgery     CESAREAN SECTION     COLONOSCOPY     5-6 years ago per pt-[place] normal exam per pt     Medications:   Current Facility-Administered Medications:    losartan  (COZAAR ) tablet 25 mg, 25 mg, Oral, Daily, Geiple, Joshua, PA-C   metFORMIN  (GLUCOPHAGE ) tablet 500 mg, 500 mg, Oral, BID WC, Geiple, Joshua, PA-C   OLANZapine  (ZYPREXA ) tablet 10 mg, 10 mg, Oral, BID, Geiple, Joshua, PA-C   [START ON 12/15/2023] venlafaxine  XR (EFFEXOR -XR) 24 hr capsule 37.5 mg, 37.5 mg, Oral, Q breakfast, Geiple, Joshua, PA-C  Current Outpatient Medications:    albuterol  (VENTOLIN  HFA) 108 (90 Base) MCG/ACT inhaler, Inhale 2 puffs into the lungs every 6 (six) hours as needed for wheezing or shortness of  breath., Disp: 6.7 g, Rfl: 2   clotrimazole  (LOTRIMIN ) 1 % cream, Apply to affected area 2 times daily, Disp: 15 g, Rfl: 0   desvenlafaxine (PRISTIQ) 25 MG 24 hr tablet, Take 25 mg by mouth daily., Disp: , Rfl:    fluticasone  furoate-vilanterol (BREO ELLIPTA ) 100-25 MCG/ACT AEPB, Inhale 1 puff into the lungs daily., Disp: 60 each, Rfl: 0   furosemide  (LASIX ) 20 MG tablet, Take 1 tablet (20 mg total) by mouth daily as needed for fluid or edema (Shortness of breath)., Disp: 30 tablet, Rfl: 3   Iron  Polysacch Cmplx-B12-FA 150-0.025-1 MG CAPS, Take 1 capsule by mouth daily., Disp: 30 capsule, Rfl: 3   losartan  (COZAAR ) 25 MG tablet, Take 1 tablet (25 mg total) by mouth daily., Disp: 30 tablet, Rfl: 2   metFORMIN  (GLUCOPHAGE ) 500 MG tablet, Take 1 tablet (500 mg total) by mouth 2 (two) times daily with a meal., Disp: 60 tablet,  Rfl: 0   methocarbamol  (ROBAXIN ) 500 MG tablet, Take 1 tablet (500 mg total) by mouth every 8 (eight) hours as needed for muscle spasms., Disp: 30 tablet, Rfl: 0   naproxen  (NAPROSYN ) 500 MG tablet, Take 1 tablet (500 mg total) by mouth 2 (two) times daily., Disp: 30 tablet, Rfl: 0   naproxen  sodium (ALEVE ) 220 MG tablet, Take 220 mg by mouth daily as needed., Disp: , Rfl:    OLANZapine  (ZYPREXA ) 10 MG tablet, Take 10 mg by mouth 2 (two) times daily., Disp: , Rfl:    primidone  (MYSOLINE ) 50 MG tablet, Take 2 tablets (100 mg total) by mouth 3 (three) times daily., Disp: 180 tablet, Rfl: 0  Allergies: Allergies  Allergen Reactions   Latuda  [Lurasidone  Hcl] Nausea And Vomiting and Other (See Comments)    Shaking    Abilify  [Aripiprazole ] Other (See Comments)    shaking   Flagyl  [Metronidazole  Hcl] Nausea And Vomiting   Prolixin  [Fluphenazine ] Other (See Comments)    shaking   Risperidone  And Paliperidone Other (See Comments)    shaking   Seroquel  [Quetiapine  Fumarate] Other (See Comments)    [name] states she had a syncopal episode after taking medication.    Sulfa  Antibiotics Nausea And Vomiting    Patricia DELENA Patient, NP

## 2023-12-14 NOTE — Progress Notes (Signed)
 Pt was accepted to [facility] on 12/14/2023. Bed assignment: 325  Pt meets inpatient criteria per Efrain Patient, NP   Attending Physician will be: Dr.Jadepalle    Report can be called to: [phone]  Pt can arrive after pending consents   Care Team Notified: [facility] Cherylynn Ernst, RN, Luke Sprang, RN, Beryl Sprang, RN

## 2023-12-14 NOTE — ED Provider Notes (Signed)
  EMERGENCY DEPARTMENT AT [facility] Provider Note   CSN: [id number] Arrival date & time: 12/14/23  1359     Patient presents with: Hallucinations   [name] is a 53 y.o. female.   Patient with history of diabetes, schizoaffective disorder, bipolar disorder -- presents to the emergency department today for evaluation of worsening psychosis.  She states that, despite taking her medication including olanzapine  and desvenlafaxine, she is having auditory command hallucinations.  These are bothersome.  She denies associated suicidal or homicidal ideations.  She denies substance use including drugs or alcohol.  She states that her blood sugar was 250 this morning.  She would like to talk to a psychiatrist about her symptoms.       Prior to Admission medications   Medication Sig Start Date End Date Taking? Authorizing Provider  albuterol  (VENTOLIN  HFA) 108 (90 Base) MCG/ACT inhaler Inhale 2 puffs into the lungs every 6 (six) hours as needed for wheezing or shortness of breath. 07/14/22   Rai, Ripudeep K, MD  clotrimazole  (LOTRIMIN ) 1 % cream Apply to affected area 2 times daily 08/20/23   Cardama, Raynell Moder, MD  desvenlafaxine (PRISTIQ) 25 MG 24 hr tablet Take 25 mg by mouth daily.    [provider]  fluticasone  furoate-vilanterol (BREO ELLIPTA ) 100-25 MCG/ACT AEPB Inhale 1 puff into the lungs daily. 07/14/22   Rai, Nydia POUR, MD  furosemide  (LASIX ) 20 MG tablet Take 1 tablet (20 mg total) by mouth daily as needed for fluid or edema (Shortness of breath). 07/14/22   Rai, Ripudeep POUR, MD  Iron  Polysacch Cmplx-B12-FA 150-0.025-1 MG CAPS Take 1 capsule by mouth daily. 07/14/22   Rai, Nydia POUR, MD  losartan  (COZAAR ) 25 MG tablet Take 1 tablet (25 mg total) by mouth daily. 07/14/22   Rai, Nydia POUR, MD  metFORMIN  (GLUCOPHAGE ) 500 MG tablet Take 1 tablet (500 mg total) by mouth 2 (two) times daily with a meal. 10/26/23   Dreama Longs, MD  methocarbamol   (ROBAXIN ) 500 MG tablet Take 1 tablet (500 mg total) by mouth every 8 (eight) hours as needed for muscle spasms. 12/04/22   Theadore Ozell HERO, MD  naproxen  (NAPROSYN ) 500 MG tablet Take 1 tablet (500 mg total) by mouth 2 (two) times daily. 12/04/22   Theadore Ozell HERO, MD  naproxen  sodium (ALEVE ) 220 MG tablet Take 220 mg by mouth daily as needed.    [provider]  OLANZapine  (ZYPREXA ) 10 MG tablet Take 10 mg by mouth 2 (two) times daily.    [provider]  primidone  (MYSOLINE ) 50 MG tablet Take 2 tablets (100 mg total) by mouth 3 (three) times daily. 07/14/22   Rai, Nydia POUR, MD  iron  polysaccharides (NIFEREX) 150 MG capsule Take 150 mg by mouth 2 (two) times daily.  10/01/12  [provider]    Allergies: Latuda  [lurasidone  hcl], Abilify  [aripiprazole ], Flagyl  [metronidazole  hcl], Prolixin  [fluphenazine ], Risperidone  and paliperidone, Seroquel  [quetiapine  fumarate], and Sulfa antibiotics    Review of Systems  Updated Vital Signs BP (!) 171/82   Pulse 88   Temp 98 F (36.7 C)   Resp 16   SpO2 100%   Physical Exam Vitals and nursing note reviewed.  Constitutional:      Appearance: She is well-developed.  HENT:     Head: Normocephalic and atraumatic.     Nose: Nose normal.     Mouth/Throat:     Mouth: Mucous membranes are moist.  Eyes:     Conjunctiva/sclera: Conjunctivae  normal.  Pulmonary:     Effort: No respiratory distress.  Musculoskeletal:     Cervical back: Normal range of motion and neck supple.  Skin:    General: Skin is warm and dry.  Neurological:     Mental Status: She is alert.     (all labs ordered are listed, but only abnormal results are displayed) Labs Reviewed  COMPREHENSIVE METABOLIC PANEL WITH GFR - Abnormal; Notable for the following components:      Result Value   Glucose, Bld 300 (*)    AST 43 (*)    Anion gap 16 (*)    All other components within normal limits  CBC - Abnormal; Notable for the following components:    Hemoglobin 11.2 (*)    HCT 35.7 (*)    All other components within normal limits  URINE DRUG SCREEN - Abnormal; Notable for the following components:   Tetrahydrocannabinol POSITIVE (*)    All other components within normal limits  ETHANOL    EKG: None  Radiology: No results found.   Procedures   Medications Ordered in the ED  venlafaxine  XR (EFFEXOR -XR) 24 hr capsule 37.5 mg (has no administration in time range)  losartan  (COZAAR ) tablet 25 mg (25 mg Oral Given by Other 12/14/23 1757)  metFORMIN  (GLUCOPHAGE ) tablet 500 mg (500 mg Oral Patient Refused/Not Given 12/14/23 1757)  OLANZapine  (ZYPREXA ) tablet 10 mg (has no administration in time range)    ED Course  Patient seen and examined. History obtained directly from patient.   Labs/EKG: Ordered CBC, CMP, ethanol, UDS.  Imaging: None ordered  Medications/Fluids: None ordered  Most recent vital signs reviewed and are as follows: BP (!) 171/82   Pulse 88   Temp 98 F (36.7 C)   Resp 16   SpO2 100%   Initial impression: Patient with reported worsening auditory hallucinations, currently stable.  Will request psych consultation after medical clearance completed.  4:35 PM Reassessment performed. Patient appears stable.   Labs personally reviewed and interpreted including: CBC with mild anemia hemoglobin 11.2 which is at baseline; CMP with hyperglycemia, glucose 300 otherwise normal kidney function and electrolytes; ethanol is negative; UDS positive for THC only.  Most current vital signs reviewed and are as follows: BP (!) 171/82   Pulse 88   Temp 98 F (36.7 C)   Resp 16   SpO2 100%   Plan: I have placed orders for home blood pressure and diabetes medications due to elevated blood pressure and elevated blood sugar.  I do not suspect DKA at this point.  Patient medically cleared for psychiatric consultation.  TTS evaluation ordered.  6:28 PM reviewed psych consult note.  Patient accepted for inpatient  treatment.                                  Medical Decision Making Amount and/or Complexity of Data Reviewed Labs: ordered.  Risk Prescription drug management.   Patient with decompensated schizoaffective disorder.  Hyperglycemia noted without ketosis.  Hypertensive without hypertensive emergency.     Final diagnoses:  Auditory hallucinations  Hyperglycemia without ketosis    ED Discharge Orders     None          Desiderio Chew, DEVONNA 12/14/23 CELESTER Garrick Charleston, MD 12/15/23 [phone]

## 2023-12-14 NOTE — ED Triage Notes (Signed)
 Patient BIB [facility] c/o auditory hallucinations. Patient report she's hearing voices stating negative thoughts like she's not gonna make it.  Patient report taking her medication regularly. Patient denies SI/ HI.

## 2023-12-14 NOTE — ED Notes (Signed)
 Medic asked tech [place] to ask triage to please click off EKG and which doctor it was given to.

## 2023-12-15 ENCOUNTER — Inpatient Hospital Stay
Admission: AD | Admit: 2023-12-15 | Discharge: 2023-12-17 | DRG: 885 | Disposition: A | Discharge status: Home / Self Care | Payer: MEDICAID | Source: Intra-hospital | Attending: Psychiatry | Admitting: Psychiatry

## 2023-12-15 ENCOUNTER — Encounter: Payer: Self-pay | Admitting: Psychiatry

## 2023-12-15 DIAGNOSIS — F419 Anxiety disorder, unspecified: Secondary | ICD-10-CM | POA: Diagnosis present

## 2023-12-15 DIAGNOSIS — F200 Paranoid schizophrenia: Secondary | ICD-10-CM | POA: Diagnosis present

## 2023-12-15 DIAGNOSIS — F259 Schizoaffective disorder, unspecified: Secondary | ICD-10-CM | POA: Diagnosis present

## 2023-12-15 DIAGNOSIS — E119 Type 2 diabetes mellitus without complications: Secondary | ICD-10-CM | POA: Diagnosis present

## 2023-12-15 DIAGNOSIS — Z8249 Family history of ischemic heart disease and other diseases of the circulatory system: Secondary | ICD-10-CM

## 2023-12-15 DIAGNOSIS — Z888 Allergy status to other drugs, medicaments and biological substances status: Secondary | ICD-10-CM

## 2023-12-15 DIAGNOSIS — Z833 Family history of diabetes mellitus: Secondary | ICD-10-CM

## 2023-12-15 DIAGNOSIS — J45909 Unspecified asthma, uncomplicated: Secondary | ICD-10-CM | POA: Diagnosis present

## 2023-12-15 DIAGNOSIS — F319 Bipolar disorder, unspecified: Secondary | ICD-10-CM | POA: Diagnosis present

## 2023-12-15 DIAGNOSIS — F17210 Nicotine dependence, cigarettes, uncomplicated: Secondary | ICD-10-CM | POA: Diagnosis present

## 2023-12-15 DIAGNOSIS — F17290 Nicotine dependence, other tobacco product, uncomplicated: Secondary | ICD-10-CM | POA: Diagnosis present

## 2023-12-15 DIAGNOSIS — Z560 Unemployment, unspecified: Secondary | ICD-10-CM | POA: Diagnosis not present

## 2023-12-15 DIAGNOSIS — R440 Auditory hallucinations: Secondary | ICD-10-CM | POA: Diagnosis not present

## 2023-12-15 DIAGNOSIS — Z882 Allergy status to sulfonamides status: Secondary | ICD-10-CM

## 2023-12-15 DIAGNOSIS — Z7984 Long term (current) use of oral hypoglycemic drugs: Secondary | ICD-10-CM

## 2023-12-15 DIAGNOSIS — Z79899 Other long term (current) drug therapy: Secondary | ICD-10-CM | POA: Diagnosis not present

## 2023-12-15 LAB — GLUCOSE, CAPILLARY
Glucose-Capillary: 287 mg/dL — ABNORMAL HIGH (ref 70–99)
Glucose-Capillary: 355 mg/dL — ABNORMAL HIGH (ref 70–99)
Glucose-Capillary: 207 mg/dL — ABNORMAL HIGH (ref 70–99)
Glucose-Capillary: 304 mg/dL — ABNORMAL HIGH (ref 70–99)

## 2023-12-15 LAB — CBG MONITORING, ED: Glucose-Capillary: 314 mg/dL — ABNORMAL HIGH (ref 70–99)

## 2023-12-15 MED ORDER — OLANZAPINE 10 MG IM SOLR: 5.0000 mg | Freq: Three times a day (TID) | INTRAMUSCULAR | Status: DC | PRN

## 2023-12-15 MED ORDER — ROSUVASTATIN CALCIUM 20 MG PO TABS
20.0000 mg | ORAL_TABLET | Freq: Every day | ORAL | Status: DC
  Administered 2023-12-16 – 2023-12-17 (×2): 20 mg via ORAL
  Filled 2023-12-15 (×2): qty 1

## 2023-12-15 MED ORDER — MAGNESIUM HYDROXIDE 400 MG/5ML PO SUSP: 30.0000 mL | Freq: Every day | ORAL | Status: DC | PRN

## 2023-12-15 MED ORDER — INSULIN ASPART 100 UNIT/ML IJ SOLN
0.0000 [IU] | Freq: Three times a day (TID) | INTRAMUSCULAR | Status: DC
  Administered 2023-12-15: 5 [IU] via SUBCUTANEOUS
  Administered 2023-12-15: 8 [IU] via SUBCUTANEOUS
  Administered 2023-12-15: 15 [IU] via SUBCUTANEOUS
  Administered 2023-12-16: 8 [IU] via SUBCUTANEOUS
  Administered 2023-12-16: 11 [IU] via SUBCUTANEOUS
  Administered 2023-12-17: 15 [IU] via SUBCUTANEOUS
  Filled 2023-12-15 (×6): qty 1

## 2023-12-15 MED ORDER — GLIPIZIDE 5 MG PO TABS
5.0000 mg | ORAL_TABLET | Freq: Every day | ORAL | Status: DC
Start: 2023-12-16 — End: 2023-12-17
  Administered 2023-12-16 – 2023-12-17 (×2): 5 mg via ORAL
  Filled 2023-12-15 (×2): qty 1

## 2023-12-15 MED ORDER — OLANZAPINE 10 MG IM SOLR: 10.0000 mg | Freq: Three times a day (TID) | INTRAMUSCULAR | Status: DC | PRN

## 2023-12-15 MED ORDER — INSULIN ASPART 100 UNIT/ML IJ SOLN
0.0000 [IU] | Freq: Every day | INTRAMUSCULAR | Status: DC
  Administered 2023-12-15: 4 [IU] via SUBCUTANEOUS
  Administered 2023-12-16: 3 [IU] via SUBCUTANEOUS
  Filled 2023-12-15 (×2): qty 1

## 2023-12-15 MED ORDER — INSULIN ASPART 100 UNIT/ML IJ SOLN
5.0000 [IU] | Freq: Once | INTRAMUSCULAR | Status: AC
  Administered 2023-12-15: 5 [IU] via SUBCUTANEOUS
  Filled 2023-12-15: qty 0.05

## 2023-12-15 MED ORDER — AMLODIPINE BESYLATE 5 MG PO TABS
5.0000 mg | ORAL_TABLET | Freq: Every day | ORAL | Status: DC
  Administered 2023-12-15 – 2023-12-17 (×2): 5 mg via ORAL
  Filled 2023-12-15 (×3): qty 1

## 2023-12-15 MED ORDER — OLANZAPINE 10 MG PO TABS
10.0000 mg | ORAL_TABLET | Freq: Two times a day (BID) | ORAL | Status: DC
Start: 2023-12-15 — End: 2023-12-17
  Administered 2023-12-15 – 2023-12-17 (×4): 10 mg via ORAL
  Filled 2023-12-15 (×4): qty 1

## 2023-12-15 MED ORDER — PRIMIDONE 50 MG PO TABS
100.0000 mg | ORAL_TABLET | Freq: Three times a day (TID) | ORAL | Status: DC
  Administered 2023-12-16: 100 mg via ORAL
  Filled 2023-12-15 (×3): qty 2

## 2023-12-15 MED ORDER — ALUM & MAG HYDROXIDE-SIMETH 200-200-20 MG/5ML PO SUSP: 30.0000 mL | ORAL | Status: DC | PRN

## 2023-12-15 MED ORDER — OLANZAPINE 5 MG PO TBDP: 5.0000 mg | ORAL_TABLET | Freq: Three times a day (TID) | ORAL | Status: DC | PRN

## 2023-12-15 MED ORDER — ALBUTEROL SULFATE HFA 108 (90 BASE) MCG/ACT IN AERS: 2.0000 | INHALATION_SPRAY | Freq: Four times a day (QID) | RESPIRATORY_TRACT | Status: DC | PRN

## 2023-12-15 MED ORDER — VENLAFAXINE HCL ER 37.5 MG PO CP24
37.5000 mg | ORAL_CAPSULE | Freq: Every day | ORAL | Status: DC
  Administered 2023-12-16 – 2023-12-17 (×2): 37.5 mg via ORAL
  Filled 2023-12-15 (×2): qty 1

## 2023-12-15 NOTE — Group Note (Signed)
 [facility] LCSW Group Therapy Note   Group Date: 12/15/2023 Start Time: 1230 End Time: 1330   Type of Therapy/Topic:  Group Therapy:  Emotion Regulation  Participation Level:  Did Not Attend   Mood:  Description of Group:    The purpose of this group is to assist patients in learning to regulate negative emotions and experience positive emotions. Patients will be guided to discuss ways in which they have been vulnerable to their negative emotions. These vulnerabilities will be juxtaposed with experiences of positive emotions or situations, and patients challenged to use positive emotions to combat negative ones. Special emphasis will be placed on coping with negative emotions in conflict situations, and patients will process healthy conflict resolution skills.  Therapeutic Goals: Patient will identify two positive emotions or experiences to reflect on in order to balance out negative emotions:  Patient will label two or more emotions that they find the most difficult to experience:  Patient will be able to demonstrate positive conflict resolution skills through discussion or role plays:   Summary of Patient Progress:   Did not attend group.     Therapeutic Modalities:   Cognitive Behavioral Therapy Feelings Identification Dialectical Behavioral Therapy   Patricia CHRISTELLA Kerns, LCSW

## 2023-12-15 NOTE — H&P (Signed)
 Psychiatric Admission Assessment Adult  Patient Identification: Dailin Sosnowski MRN:  [id number] Date of Evaluation:  12/15/2023 Chief Complaint:  Schizoaffective disorder, unspecified (HCC) [F25.9]   History of Present Illness:   The patient is a 53 y/o female who presented voluntarily to the hospital reporting auditory hallucinations that tell her that her life is going to end and instruct her not to engage in undisclosed activities. She denies visual hallucinations and denies suicidal or homicidal ideation. She states she has been under significant stress, initially noting she has personal issues, but later clarifying that much of her stress is related to challenges her children are experiencing, which leaves her feeling frustrated and overwhelmed. She reports that her antipsychotic medication regimen tends to be effective when she is at baseline, but she finds her symptoms worsen under periods of increased stress. She has had numerous prior antipsychotic trials including trilafon , zyprexa , abilify , aristada , geodon , latuda , prolixin , risperdal , invega, seroquel , and haldol , with cogentin  used adjunctively. She currently follows with an ACT team through [facility] under the care of Dr. Malinda. She lives alone and receives disability income. She does not recall when her last psychiatric hospitalization occurred and denies any past history of suicide attempts.  Spoke with ACT team lead who indicated pt generally has a similar complaint this time of year, every year and that she sounds near her baseline. He is working to get a member of the act team to come see her. He is also going to have Dr. Malinda give me a call back to discuss patient's medications.   On ED evaluation, she was observed lying on a stretcher in mild distress, alert and oriented to person, place, and time, and able to converse coherently. She was calm, cooperative, and attentive, with anxious mood and congruent affect.  Speech and behavior were normal, and although she denied current suicidal or homicidal ideation, there was objective evidence of psychosis and paranoia. She did not appear overtly to be responding to internal or external stimuli during the encounter.     Total Time spent with patient: 1 hour Sleep  Sleep:Sleep: Good  Past Psychiatric History:   Psychiatric History:  Information collected from patient and chart review   Prev Dx/Sx: chizophrenia, schizoaffective disorder, bipolar disorder, anxiety disorder and cannabis abuse Current Psych Provider: unknown Home Meds (current): zyprexa   Previous Med Trials: seroquel , abilify , risperdone, latuda  and prolixin  Therapy: unknown   Prior Psych Hospitalization: yes  Prior Self Harm: denies Prior Violence: denies   Family Psych History: none Family Hx suicide: none   Social History:  Developmental Hx: WNL Educational Hx: unknown Occupational Hx: disability Legal Hx: unknown Living Situation: lives alone Spiritual Hx: unknown Access to weapons/lethal means: denies    Substance History Alcohol: denies  Tobacco: endorses Illicit drugs: Marijuana  Prescription drug abuse: denies Rehab hx: denies [place] Scale:  Flowsheet Row Admission (Current) from 12/15/2023 in [facility] INPATIENT BEHAVIORAL MEDICINE ED from 12/14/2023 in [facility] Emergency Department at [facility] ED from 11/27/2023 in [facility] Emergency Department at [facility]  C-SSRS RISK CATEGORY No Risk No Risk No Risk     Past Medical History:  Past Medical History:  Diagnosis Date   Arthritis    Asthma    Bipolar 1 disorder (HCC)    Bronchitis    Depression    Hemorrhoid    Schizoaffective disorder (HCC)    Schizophrenia (HCC)    Pt denies and reports it is schizoaffective disorder.    Transfusion history  Past Surgical History:  Procedure Laterality Date   Arm surgery     CESAREAN SECTION     COLONOSCOPY     5-6 years ago per pt-near  [place] normal exam per pt   Family History:  Family History  Problem Relation Age of Onset   Hypertension Mother    Hypertension Father    Diabetes type II Other    Stomach cancer Cousin    Colon cancer Neg Hx    Colon polyps Neg Hx    Esophageal cancer Neg Hx    Rectal cancer Neg Hx     Social History:  Social History   Substance and Sexual Activity  Alcohol Use No     Social History   Substance and Sexual Activity  Drug Use Not Currently   Types: Marijuana   Comment: Former user      Allergies:   Allergies  Allergen Reactions   Latuda  [Lurasidone  Hcl] Nausea And Vomiting and Other (See Comments)    Shaking, also   Abilify  [Aripiprazole ] Other (See Comments)    Shaking   Flagyl  [Metronidazole  Hcl] Nausea And Vomiting   Metformin  And Related Diarrhea   Prolixin  [Fluphenazine ] Other (See Comments)    Shaking    Risperidone  And Paliperidone Other (See Comments)    Shaking    Seroquel  [Quetiapine  Fumarate] Other (See Comments)    Patient states she had a syncopal episode after taking medication.    Sulfa Antibiotics Nausea And Vomiting   Lab Results:  Results for orders placed or performed during the hospital encounter of 12/15/23 (from the past 48 hours)  Glucose, capillary     Status: Abnormal   Collection Time: 12/15/23  8:16 AM  Result Value Ref Range   Glucose-Capillary 287 (H) 70 - 99 mg/dL    Comment: Glucose reference range applies only to samples taken after fasting for at least 8 hours.  Glucose, capillary     Status: Abnormal   Collection Time: 12/15/23 12:18 PM  Result Value Ref Range   Glucose-Capillary 355 (H) 70 - 99 mg/dL    Comment: Glucose reference range applies only to samples taken after fasting for at least 8 hours.    Blood Alcohol level:  Lab Results  Component Value Date   [facility] <15 12/14/2023   ETH <10 07/09/2021    Metabolic Disorder Labs:  Lab Results  Component Value Date   HGBA1C 6.3 (H) 01/30/2017   MPG 134.11  01/30/2017   MPG 128 05/11/2015   No results found for: PROLACTIN Lab Results  Component Value Date   CHOL 193 11/27/2019   TRIG 75 11/27/2019   HDL 54 11/27/2019   CHOLHDL 3.6 11/27/2019   VLDL 15 11/27/2019   LDLCALC 124 (H) 11/27/2019   LDLCALC 93 01/29/2017    Current Medications: Current Facility-Administered Medications  Medication Dose Route Frequency Provider Last Rate Last Admin   alum & mag hydroxide-simeth (MAALOX/MYLANTA) 200-200-20 MG/5ML suspension 30 mL  30 mL Oral Q4H PRN McLauchlin, Angela, NP       insulin  aspart (novoLOG ) injection 0-15 Units  0-15 Units Subcutaneous TID WC McLauchlin, Angela, NP   15 Units at 12/15/23 1220   insulin  aspart (novoLOG ) injection 0-5 Units  0-5 Units Subcutaneous QHS McLauchlin, Angela, NP       magnesium  hydroxide (MILK OF MAGNESIA) suspension 30 mL  30 mL Oral Daily PRN McLauchlin, Angela, NP       OLANZapine  (ZYPREXA ) injection 10 mg  10 mg Intramuscular TID PRN  McLauchlin, Angela, NP       OLANZapine  (ZYPREXA ) injection 5 mg  5 mg Intramuscular TID PRN McLauchlin, Angela, NP       OLANZapine  zydis (ZYPREXA ) disintegrating tablet 5 mg  5 mg Oral TID PRN McLauchlin, Angela, NP       PTA Medications: Medications Prior to Admission  Medication Sig Dispense Refill Last Dose/Taking   albuterol  (VENTOLIN  HFA) 108 (90 Base) MCG/ACT inhaler Inhale 2 puffs into the lungs every 6 (six) hours as needed for wheezing or shortness of breath. 6.7 g 2    amLODipine  (NORVASC ) 5 MG tablet Take 5 mg by mouth daily.      clotrimazole  (LOTRIMIN ) 1 % cream Apply to affected area 2 times daily (Patient not taking: Reported on 12/14/2023) 15 g 0    desvenlafaxine (PRISTIQ) 25 MG 24 hr tablet Take 25 mg by mouth daily.      fluticasone  furoate-vilanterol (BREO ELLIPTA ) 100-25 MCG/ACT AEPB Inhale 1 puff into the lungs daily. (Patient not taking: Reported on 12/14/2023) 60 each 0    furosemide  (LASIX ) 20 MG tablet Take 1 tablet (20 mg total) by mouth daily  as needed for fluid or edema (Shortness of breath). (Patient not taking: Reported on 12/14/2023) 30 tablet 3    glipiZIDE  (GLUCOTROL ) 5 MG tablet Take 5 mg by mouth daily before breakfast.      Iron  Polysacch Cmplx-B12-FA 150-0.025-1 MG CAPS Take 1 capsule by mouth daily. (Patient not taking: Reported on 12/14/2023) 30 capsule 3    losartan  (COZAAR ) 25 MG tablet Take 1 tablet (25 mg total) by mouth daily. (Patient not taking: Reported on 12/14/2023) 30 tablet 2    metFORMIN  (GLUCOPHAGE ) 500 MG tablet Take 1 tablet (500 mg total) by mouth 2 (two) times daily with a meal. (Patient not taking: Reported on 12/14/2023) 60 tablet 0    methocarbamol  (ROBAXIN ) 500 MG tablet Take 1 tablet (500 mg total) by mouth every 8 (eight) hours as needed for muscle spasms. (Patient not taking: Reported on 12/14/2023) 30 tablet 0    naproxen  (NAPROSYN ) 500 MG tablet Take 1 tablet (500 mg total) by mouth 2 (two) times daily. (Patient not taking: Reported on 12/14/2023) 30 tablet 0    naproxen  sodium (ALEVE ) 220 MG tablet Take 220 mg by mouth 2 (two) times daily as needed (for pain).      OLANZapine  (ZYPREXA ) 10 MG tablet Take 10 mg by mouth in the morning and at bedtime.      primidone  (MYSOLINE ) 50 MG tablet Take 2 tablets (100 mg total) by mouth 3 (three) times daily. 180 tablet 0    rosuvastatin  (CRESTOR ) 20 MG tablet Take 20 mg by mouth daily.       Psychiatric Specialty Exam:  Presentation  General Appearance: Casual  Eye Contact:Fair  Speech:Clear and Coherent  Speech Volume:Normal    Mood and Affect  Mood:Euthymic  Affect:Congruent   Thought Process  Thought Processes:Coherent  Descriptions of Associations:Intact  Orientation:Full (Time, Place and Person)  Thought Content:WDL  Hallucinations:Hallucinations: Auditory Description of Auditory Hallucinations: Telling her she is going to die  Ideas of Reference:None  Suicidal Thoughts:Suicidal Thoughts: No  Homicidal Thoughts:Homicidal  Thoughts: No   Sensorium  Memory:Immediate Fair; Recent Fair  Judgment:No data recorded Insight:Shallow   Executive Functions  Concentration:Fair  Attention Span:Fair  Recall:Fair  Fund of Knowledge:Fair  Language:Fair   Psychomotor Activity  Psychomotor Activity:Psychomotor Activity: Normal   Assets  Assets:Communication Skills; Desire for Improvement; Housing    Musculoskeletal: Strength & Muscle  Tone: within normal limits Gait & Station: normal  Physical Exam: Physical Exam Vitals and nursing note reviewed.  Constitutional:      Appearance: She is obese.  HENT:     Head: Atraumatic.  Eyes:     Extraocular Movements: Extraocular movements intact.  Pulmonary:     Effort: Pulmonary effort is normal.  Neurological:     Mental Status: She is alert and oriented to person, place, and time.    Review of Systems  Psychiatric/Behavioral:  Positive for depression, hallucinations and substance abuse. Negative for suicidal ideas. The patient is nervous/anxious. The patient does not have insomnia.    Blood pressure (!) 145/62, pulse 67, temperature (!) 97 F (36.1 C), temperature source Oral, resp. rate 17, height 5' 4 (1.626 m), weight 126.6 kg. Body mass index is 47.89 kg/m.  Principal Diagnosis: Schizoaffective disorder, unspecified (HCC) Diagnosis:  Principal Problem:   Schizoaffective disorder, unspecified (HCC)   Clinical Decision Making:  This is a 53 y/o female patient with a longstanding psychiatric history of psychotic disorder who presents voluntarily for evaluation of worsening auditory hallucinations in the context of psychosocial stressors related to her children. She demonstrates insight into her condition, reporting that her medications work at baseline but are less effective under stress. She has had multiple prior antipsychotic trials and continues to require close follow-up through her ACT team. On assessment, she is anxious but cooperative,  with coherent thought processes and no suicidal or homicidal ideation, though ongoing psychotic symptoms and paranoia are evident. Her current presentation appears consistent with a chronic psychotic disorder exacerbated by situational stressors, rather than an acute manic or depressive episode. She does not currently exhibit signs of intoxication or withdrawal, and there is no acute safety concern based on her denial of SI/HI. Continued inpatient stabilization, medication review, and coordination with her outpatient ACT team are warranted to optimize management and address breakthrough symptoms during stress periods.  Treatment Plan Summary:  Safety and Monitoring:             -- Voluntary admission to inpatient psychiatric unit for safety, stabilization and treatment             -- Daily contact with patient to assess and evaluate symptoms and progress in treatment             -- Patient's case to be discussed in multi-disciplinary team meeting             -- Observation Level: q15 minute checks             -- Vital signs:  q12 hours             -- Precautions: suicide, elopement, and assault   2. Psychiatric Diagnoses and Treatment:   I have currently awaiting a response from the ACT team provider with regards to further medication adjustments.  Given her extensive history.  I have extensively discussed this case with Dr. Donnelly the attending physician here if we do not hear from the ACT team provider will initiate Geodon  20 mg daily.            Schizoaffective disorder:  Restart home meds Zyprexa  10 mg twice daily Pristiq 25 mg daily is off formulary converts to Effexor  37.5 mg daily     -- The risks/benefits/side-effects/alternatives to this medication were discussed in detail with the patient and time was given for questions. The patient consents to medication trial.                --  Metabolic profile and EKG monitoring obtained while on an atypical antipsychotic (BMI: Lipid Panel:  HbgA1c: QTc:)              -- Encouraged patient to participate in unit milieu and in scheduled group therapies                            3. Medical Issues Being Addressed:  Amlodipine  5 mg daily, glipizide  5 mg daily, primidone  100 mg 3 times daily, and rosuvastatin  20 mg daily are restarted the medications were confirmed with the medications that she brought with her.  Diabetes coordination was consulted and assisted with insulin  and glipizide  management.   4. Discharge Planning:              -- Social work and case management to assist with discharge planning and identification of hospital follow-up needs prior to discharge             -- Estimated LOS: 5-7 days             -- Discharge Concerns: Need to establish a safety plan; Medication compliance and effectiveness             -- Discharge Goals: Return home with outpatient referrals follow ups  Physician Treatment Plan for Primary Diagnosis: Schizoaffective disorder, unspecified (HCC) Long Term Goal(s): Improvement in symptoms so as ready for discharge  Short Term Goals: Ability to maintain clinical measurements within normal limits will improve and Ability to identify triggers associated with substance abuse/mental health issues will improve   I certify that inpatient services furnished can reasonably be expected to improve the patient's condition.    Donnice FORBES Right, PA-C 8/28/20251:45 PM

## 2023-12-15 NOTE — Plan of Care (Signed)
 Pt new to the unit tonight, hasn't had time to progress  Problem: Education: Goal: Knowledge of [facility] Education information/materials will improve Outcome: Not Progressing Goal: Emotional status will improve Outcome: Not Progressing Goal: Mental status will improve Outcome: Not Progressing Goal: Verbalization of understanding the information provided will improve Outcome: Not Progressing   Problem: Activity: Goal: Interest or engagement in activities will improve Outcome: Not Progressing Goal: Sleeping patterns will improve Outcome: Not Progressing   Problem: Coping: Goal: Ability to verbalize frustrations and anger appropriately will improve Outcome: Not Progressing Goal: Ability to demonstrate self-control will improve Outcome: Not Progressing   Problem: Health Behavior/Discharge Planning: Goal: Identification of resources available to assist in meeting health care needs will improve Outcome: Not Progressing Goal: Compliance with treatment plan for underlying cause of condition will improve Outcome: Not Progressing   Problem: Physical Regulation: Goal: Ability to maintain clinical measurements within normal limits will improve Outcome: Not Progressing   Problem: Safety: Goal: Periods of time without injury will increase Outcome: Not Progressing   Problem: Education: Goal: Knowledge of [facility] Education information/materials will improve Outcome: Not Progressing Goal: Emotional status will improve Outcome: Not Progressing Goal: Mental status will improve Outcome: Not Progressing Goal: Verbalization of understanding the information provided will improve Outcome: Not Progressing   Problem: Activity: Goal: Interest or engagement in activities will improve Outcome: Not Progressing Goal: Sleeping patterns will improve Outcome: Not Progressing   Problem: Coping: Goal: Ability to verbalize frustrations and anger appropriately will  improve Outcome: Not Progressing Goal: Ability to demonstrate self-control will improve Outcome: Not Progressing   Problem: Health Behavior/Discharge Planning: Goal: Identification of resources available to assist in meeting health care needs will improve Outcome: Not Progressing Goal: Compliance with treatment plan for underlying cause of condition will improve Outcome: Not Progressing   Problem: Physical Regulation: Goal: Ability to maintain clinical measurements within normal limits will improve Outcome: Not Progressing   Problem: Safety: Goal: Periods of time without injury will increase Outcome: Not Progressing   Problem: Activity: Goal: Will verbalize the importance of balancing activity with adequate rest periods Outcome: Not Progressing   Problem: Education: Goal: Will be free of psychotic symptoms Outcome: Not Progressing Goal: Knowledge of the prescribed therapeutic regimen will improve Outcome: Not Progressing   Problem: Coping: Goal: Coping ability will improve Outcome: Not Progressing Goal: Will verbalize feelings Outcome: Not Progressing   Problem: Health Behavior/Discharge Planning: Goal: Compliance with prescribed medication regimen will improve Outcome: Not Progressing   Problem: Nutritional: Goal: Ability to achieve adequate nutritional intake will improve Outcome: Not Progressing   Problem: Role Relationship: Goal: Ability to communicate needs accurately will improve Outcome: Not Progressing Goal: Ability to interact with others will improve Outcome: Not Progressing   Problem: Safety: Goal: Ability to redirect hostility and anger into socially appropriate behaviors will improve Outcome: Not Progressing Goal: Ability to remain free from injury will improve Outcome: Not Progressing   Problem: Self-Care: Goal: Ability to participate in self-care as condition permits will improve Outcome: Not Progressing   Problem: Self-Concept: Goal: Will  verbalize positive feelings about self Outcome: Not Progressing

## 2023-12-15 NOTE — Progress Notes (Signed)
 Pt calm and pleasant during assessment denying SI/HI. Pt endorses A/H. Pt stated she had a good day but was tired. Pt compliant with medication administration per MD orders. Pt given education, support, and encouragement to be active in her treatment plan. Pt being monitored Q 15 minutes for safety per unit protocol, remains safe on the unit

## 2023-12-15 NOTE — Group Note (Signed)
 Recreation Therapy Group Note   Group Topic:General Recreation  Group Date: 12/15/2023 Start Time: 1005 End Time: 1120 Facilitators: Celestia Jeoffrey BRAVO, LRT, CTRS Location: Courtyard  Group Description: [employer]. LRT and patients played games of basketball, drew with chalk, and played corn hole while outside in the courtyard while getting fresh air and sunlight. Music was being played in the background. LRT and peers conversed about different games they have played before, what they do in their free time and anything else that is on their minds. LRT encouraged pts to drink water  after being outside, sweating and getting their heart rate up.  Goal Area(s) Addressed: Patient will build on frustration tolerance skills. Patients will partake in a competitive play game with peers. Patients will gain knowledge of new leisure interest/hobby.    Affect/Mood: N/A   Participation Level: Did not attend    Clinical Observations/Individualized Feedback: Patient did not attend group.   Plan: Continue to engage patient in RT group sessions 2-3x/week.   Jeoffrey BRAVO Celestia, LRT, CTRS 12/15/2023 11:49 AM

## 2023-12-15 NOTE — BHH Suicide Risk Assessment (Signed)
 [facility] Admission Suicide Risk Assessment   Nursing information obtained from:  Patient Demographic factors:  NA Current Mental Status:  NA Loss Factors:  NA Historical Factors:  NA Risk Reduction Factors:  NA  Total Time spent with patient: 1 hour Principal Problem: Schizoaffective disorder, unspecified (HCC) Diagnosis:  Principal Problem:   Schizoaffective disorder, unspecified (HCC)  Subjective Data:   This is a 53 y/o female patient with a longstanding psychiatric history of psychotic disorder who presents voluntarily for evaluation of worsening auditory hallucinations in the context of psychosocial stressors related to her children. She demonstrates insight into her condition, reporting that her medications work at baseline but are less effective under stress. She has had multiple prior antipsychotic trials and continues to require close follow-up through her ACT team. On assessment, she is anxious but cooperative, with coherent thought processes and no suicidal or homicidal ideation, though ongoing psychotic symptoms and paranoia are evident. Her current presentation appears consistent with a chronic psychotic disorder exacerbated by situational stressors, rather than an acute manic or depressive episode. She does not currently exhibit signs of intoxication or withdrawal, and there is no acute safety concern based on her denial of SI/HI. Continued inpatient stabilization, medication review, and coordination with her outpatient ACT team are warranted to optimize management and address breakthrough symptoms during stress periods.  Continued Clinical Symptoms:  Alcohol Use Disorder Identification Test Final Score (AUDIT): 0 The Alcohol Use Disorders Identification Test, Guidelines for Use in Primary Care, Second Edition.  World Science writer [facility]). Score between 0-7:  no or low risk or alcohol related problems. Score between 8-15:  moderate risk of alcohol related problems. Score  between 16-19:  high risk of alcohol related problems. Score 20 or above:  warrants further diagnostic evaluation for alcohol dependence and treatment.   CLINICAL FACTORS:   Previous Psychiatric Diagnoses and Treatments   Musculoskeletal: Strength & Muscle Tone: within normal limits Gait & Station: normal Patient leans: N/A  Psychiatric Specialty Exam:  Presentation  General Appearance:  Casual  Eye Contact: Fair  Speech: Clear and Coherent  Speech Volume: Normal  Handedness:No data recorded  Mood and Affect  Mood: Euthymic  Affect: Congruent   Thought Process  Thought Processes: Coherent  Descriptions of Associations:Intact  Orientation:Full (Time, Place and Person)  Thought Content:WDL  History of Schizophrenia/Schizoaffective disorder:No data recorded Duration of Psychotic Symptoms:No data recorded Hallucinations:Hallucinations: Auditory Description of Auditory Hallucinations: Telling her she is going to die  Ideas of Reference:None  Suicidal Thoughts:Suicidal Thoughts: No  Homicidal Thoughts:Homicidal Thoughts: No   Sensorium  Memory: Immediate Fair; Recent Fair  Judgment:No data recorded Insight: Shallow   Executive Functions  Concentration: Fair  Attention Span: Fair  Recall: [employer] of Knowledge: Fair  Language: Fair   Psychomotor Activity  Psychomotor Activity: Psychomotor Activity: Normal   Assets  Assets: Communication Skills; Desire for Improvement; Housing   Sleep  Sleep: Sleep: Good    Physical Exam: Physical Exam ROS Blood pressure (!) 145/62, pulse 67, temperature (!) 97 F (36.1 C), temperature source Oral, resp. rate 17, height 5' 4 (1.626 m), weight 126.6 kg. Body mass index is 47.89 kg/m.   COGNITIVE FEATURES THAT CONTRIBUTE TO RISK:  None    SUICIDE RISK:   Minimal: No identifiable suicidal ideation.  Patients presenting with no risk factors but with morbid ruminations; may be  classified as minimal risk based on the severity of the depressive symptoms  PLAN OF CARE:   Safety and Monitoring:   --  Voluntary admission to inpatient psychiatric unit for safety, stabilization and treatment -- Daily contact with patient to assess and evaluate symptoms and progress in treatment -- Patient's case to be discussed in multi-disciplinary team meeting -- Observation Level : q15 minute checks -- Vital signs:  q12 hours -- Precautions: suicide   I certify that inpatient services furnished can reasonably be expected to improve the patient's condition.   Donnice FORBES Right, PA-C 12/15/2023, 3:01 PM

## 2023-12-15 NOTE — Group Note (Signed)
 Date:  12/15/2023 Time:  10:58 AM  Group Topic/Focus:  Goals Group:   The focus of this group is to help patients establish daily goals to achieve during treatment and discuss how the patient can incorporate goal setting into their daily lives to aide in recovery. Self Care:   The focus of this group is to help patients understand the importance of self-care in order to improve or restore emotional, physical, spiritual, interpersonal, and financial health.   Participation Level:  Did Not Attend   [name] 12/15/2023, 10:58 AM

## 2023-12-15 NOTE — Group Note (Signed)
 Date:  12/15/2023 Time:  8:44 PM  Group Topic/Focus:  Spirituality:   The focus of this group is to discuss how one's spirituality can aide in recovery. Wrap-Up Group:   The focus of this group is to help patients review their daily goal of treatment and discuss progress on daily workbooks. Meditation VIDEO:    Participation Level:  Did Not Attend  Additional Comments:    Kristen VEAR Gibbon 12/15/2023, 8:44 PM

## 2023-12-15 NOTE — Progress Notes (Signed)
 Patient admitted from [facility], report received from [place], [place]. Pt calm during assessment Pt denies SI/HI. Pt endorses A/H. Pt minimal during assessment, but did get here at 0230. Pt skin assessment completed with Clarita, RN, no abnormalities or contraband found. Pt oriented to the unit and her room. Pt given education, support, and encouragement to be active in her treatment plan. Pt being monitored Q 15 minute for safety per unit protocol, remains safe on the unit

## 2023-12-15 NOTE — Tx Team (Signed)
 Initial Treatment Plan 12/15/2023 2:39 AM [name] FMW:[id number]    PATIENT STRESSORS: Other: Auditory Hallucinations     PATIENT STRENGTHS: Capable of independent living  Motivation for treatment/growth    PATIENT IDENTIFIED PROBLEMS: Auditory Hallucinations  Psychosis                   DISCHARGE CRITERIA:  Improved stabilization in mood, thinking, and/or behavior  PRELIMINARY DISCHARGE PLAN: Outpatient therapy  PATIENT/FAMILY INVOLVEMENT: This treatment plan has been presented to and reviewed with the patient, [name], and/or family member,  .  The patient and family have been given the opportunity to ask questions and make suggestions.  Joshua Hoose, RN 12/15/2023, 2:39 AM

## 2023-12-15 NOTE — Group Note (Signed)
 Recreation Therapy Group Note   Group Topic:Other  Group Date: 12/15/2023 Start Time: 1500 End Time: 1545 Facilitators: Celestia Jeoffrey FORBES ARTICE, CTRS Location: Craft Room  Activity Description/Intervention: Therapeutic Drumming. Patients with peers and staff were given the opportunity to engage in a leader facilitated [facility] Group Empowerment Drumming Circle with staff from the [employer], in partnership with [employer]. Teaching laboratory technician and trained [employer], Norleen Mon leading with LRT observing and documenting intervention and pt response. This evidenced-based practice targets 7 areas of health and wellbeing in the human experience including: stress-reduction, exercise, self-expression, camaraderie/support, nurturing, spirituality, and music-making (leisure).    Goal Area(s) Addresses:  Patient will engage in pro-social way in music group.  Patient will follow directions of drum leader on the first prompt. Patient will demonstrate no behavioral issues during group.  Patient will identify if a reduction in stress level occurs as a result of participation in therapeutic drum circle.     Affect/Mood: N/A   Participation Level: Did not attend    Clinical Observations/Individualized Feedback: Patient did not attend group.   Plan: Continue to engage patient in RT group sessions 2-3x/week.   [id number] LRT, CTRS 12/15/2023 4:30 PM

## 2023-12-15 NOTE — Plan of Care (Signed)
   Problem: Education: Goal: Emotional status will improve Outcome: Progressing Goal: Mental status will improve Outcome: Progressing

## 2023-12-15 NOTE — Inpatient Diabetes Management (Signed)
 Inpatient Diabetes Program Recommendations  AACE/ADA: New Consensus Statement on Inpatient Glycemic Control   Target Ranges:  Prepandial:   less than 140 mg/dL      Peak postprandial:   less than 180 mg/dL (1-2 hours)      Critically ill patients:  140 - 180 mg/dL    Latest Reference Range & Units 12/15/23 00:09 12/15/23 08:16  Glucose-Capillary 70 - 99 mg/dL 685 (H) 712 (H)   Review of Glycemic Control  Diabetes history: DM2 Outpatient Diabetes medications: Glipizide  5 mg QAM, Metformin  500 mg BID (not taking) Current orders for Inpatient glycemic control: Novolog  0-15 units TID with meals, Novolog  0-5 units QHS  Inpatient Diabetes Program Recommendations:    Oral DM medication: Please consider ordering Glipizide  5 mg daily.  Diet: Please consider changing diet from Regular to Carb Modified.  Thanks, Earnie Gainer, RN, MSN, CDCES Diabetes Coordinator Inpatient Diabetes Program [phone] (Team Pager from 8am to 5pm)

## 2023-12-15 NOTE — BHH Counselor (Signed)
 CSW attempted to complete the patient's PSA.  Patient declined.   Sherryle Margo, MSW, LCSW 12/15/2023 1:26 PM

## 2023-12-16 DIAGNOSIS — F259 Schizoaffective disorder, unspecified: Secondary | ICD-10-CM | POA: Diagnosis not present

## 2023-12-16 LAB — GLUCOSE, CAPILLARY
Glucose-Capillary: 253 mg/dL — ABNORMAL HIGH (ref 70–99)
Glucose-Capillary: 169 mg/dL — ABNORMAL HIGH (ref 70–99)
Glucose-Capillary: 312 mg/dL — ABNORMAL HIGH (ref 70–99)
Glucose-Capillary: 283 mg/dL — ABNORMAL HIGH (ref 70–99)

## 2023-12-16 LAB — HEMOGLOBIN A1C
Hgb A1c MFr Bld: 12.8 % — ABNORMAL HIGH (ref 4.8–5.6)
Mean Plasma Glucose: 321 mg/dL

## 2023-12-16 MED ORDER — PRIMIDONE 50 MG PO TABS
100.0000 mg | ORAL_TABLET | Freq: Two times a day (BID) | ORAL | Status: DC
  Administered 2023-12-16 – 2023-12-17 (×2): 100 mg via ORAL
  Filled 2023-12-16 (×3): qty 2
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
## 2023-12-16 NOTE — Group Note (Signed)
 Recreation Therapy Group Note   Group Topic:Health and Wellness  Group Date: 12/16/2023 Start Time: 1000 End Time: 1100 Facilitators: Celestia Jeoffrey BRAVO, LRT, CTRS Location: Courtyard  Group Description: [employer]. LRT and patients played games of basketball, drew with chalk, and played corn hole while outside in the courtyard while getting fresh air and sunlight. Music was being played in the background. LRT and peers conversed about different games they have played before, what they do in their free time and anything else that is on their minds. LRT encouraged pts to drink water  after being outside, sweating and getting their heart rate up.  Goal Area(s) Addressed: Patient will build on frustration tolerance skills. Patients will partake in a competitive play game with peers. Patients will gain knowledge of new leisure interest/hobby.    Affect/Mood: N/A   Participation Level: Did not attend    Clinical Observations/Individualized Feedback: Patient did not attend group.   Plan: Continue to engage patient in RT group sessions 2-3x/week.   Jeoffrey BRAVO Celestia, LRT, CTRS 12/16/2023 11:38 AM

## 2023-12-16 NOTE — BHH Counselor (Signed)
 Pt DECLINED both collateral communication and assistance with aftercare.    Sherryle Margo, MSW, LCSW 12/16/2023 11:55 AM

## 2023-12-16 NOTE — Progress Notes (Signed)
   12/16/23 1105  Psych Admission Type (Psych Patients Only)  Admission Status Voluntary  Psychosocial Assessment  Patient Complaints Other (Comment) (Wanting to discharge)  Eye Contact Fair  Facial Expression Flat  Affect Flat  Speech Soft  Interaction Guarded  Motor Activity Slow  Appearance/Hygiene Unremarkable  Behavior Characteristics Cooperative;Appropriate to situation  Mood Anxious  Thought Process  Coherency WDL  Content WDL  Delusions None reported or observed  Perception Hallucinations  Hallucination Auditory  Judgment Impaired  Confusion WDL  Danger to Self  Current suicidal ideation? Denies  Agreement Not to Harm Self Yes  Description of Agreement verbal  Danger to Others  Danger to Others None reported or observed   Per Zelda, RN, patient's previous nurse. This Clinical research associate took over patient's care at 1500.

## 2023-12-16 NOTE — Plan of Care (Signed)
  Problem: Education: Goal: Emotional status will improve Outcome: Progressing   Problem: Activity: Goal: Interest or engagement in activities will improve Outcome: Progressing   Problem: Safety: Goal: Periods of time without injury will increase Outcome: Progressing

## 2023-12-16 NOTE — BH IP Treatment Plan (Signed)
 Interdisciplinary Treatment and Diagnostic Plan Update  12/16/2023 Time of Session: 10:37 AM [name] MRN: [id number]  Principal Diagnosis: Schizoaffective disorder, unspecified (HCC)  Secondary Diagnoses: Principal Problem:   Schizoaffective disorder, unspecified (HCC)   Current Medications:  Current Facility-Administered Medications  Medication Dose Route Frequency Provider Last Rate Last Admin   albuterol  (VENTOLIN  HFA) 108 (90 Base) MCG/ACT inhaler 2 puff  2 puff Inhalation Q6H PRN Millington, Matthew E, PA-C       alum & mag hydroxide-simeth (MAALOX/MYLANTA) 200-200-20 MG/5ML suspension 30 mL  30 mL Oral Q4H PRN McLauchlin, Angela, NP       amLODipine  (NORVASC ) tablet 5 mg  5 mg Oral Daily Millington, Matthew E, PA-C   5 mg at 12/15/23 1708   glipiZIDE  (GLUCOTROL ) tablet 5 mg  5 mg Oral QAC breakfast Millington, Matthew E, PA-C   5 mg at 12/16/23 9252   insulin  aspart (novoLOG ) injection 0-15 Units  0-15 Units Subcutaneous TID WC McLauchlin, Angela, NP   8 Units at 12/16/23 9251   insulin  aspart (novoLOG ) injection 0-5 Units  0-5 Units Subcutaneous QHS McLauchlin, Angela, NP   4 Units at 12/15/23 2047   magnesium  hydroxide (MILK OF MAGNESIA) suspension 30 mL  30 mL Oral Daily PRN McLauchlin, Angela, NP       OLANZapine  (ZYPREXA ) injection 10 mg  10 mg Intramuscular TID PRN McLauchlin, Jon, NP       OLANZapine  (ZYPREXA ) injection 5 mg  5 mg Intramuscular TID PRN McLauchlin, Angela, NP       OLANZapine  (ZYPREXA ) tablet 10 mg  10 mg Oral BID Millington, Matthew E, PA-C   10 mg at 12/16/23 9253   OLANZapine  zydis (ZYPREXA ) disintegrating tablet 5 mg  5 mg Oral TID PRN McLauchlin, Angela, NP       primidone  (MYSOLINE ) tablet 100 mg  100 mg Oral TID Millington, Matthew E, PA-C   100 mg at 12/16/23 9252   rosuvastatin  (CRESTOR ) tablet 20 mg  20 mg Oral Daily Millington, Matthew E, PA-C   20 mg at 12/16/23 9253   venlafaxine  XR (EFFEXOR -XR) 24 hr capsule 37.5 mg  37.5 mg Oral Q  breakfast Millington, Matthew E, PA-C   37.5 mg at 12/16/23 9253   PTA Medications: Medications Prior to Admission  Medication Sig Dispense Refill Last Dose/Taking   albuterol  (VENTOLIN  HFA) 108 (90 Base) MCG/ACT inhaler Inhale 2 puffs into the lungs every 6 (six) hours as needed for wheezing or shortness of breath. 6.7 g 2 Unknown   amLODipine  (NORVASC ) 5 MG tablet Take 5 mg by mouth daily.   12/14/2023 Morning   desvenlafaxine (PRISTIQ) 25 MG 24 hr tablet Take 25 mg by mouth daily.   12/14/2023 Morning   glipiZIDE  (GLUCOTROL ) 5 MG tablet Take 5 mg by mouth daily before breakfast.   12/14/2023 Morning   naproxen  sodium (ALEVE ) 220 MG tablet Take 220 mg by mouth 2 (two) times daily as needed.   Unknown   OLANZapine  (ZYPREXA ) 10 MG tablet Take 10 mg by mouth in the morning and at bedtime.   12/14/2023 Morning   primidone  (MYSOLINE ) 50 MG tablet Take 2 tablets (100 mg total) by mouth 3 (three) times daily. 180 tablet 0 12/14/2023 Morning   rosuvastatin  (CRESTOR ) 20 MG tablet Take 20 mg by mouth daily.   12/14/2023    Patient Stressors: Other: Auditory Hallucinations    Patient Strengths: Capable of independent living  Motivation for treatment/growth   Treatment Modalities: Medication Management, Group therapy, Case management,  1  to 1 session with clinician, Psychoeducation, Recreational therapy.   Physician Treatment Plan for Primary Diagnosis: Schizoaffective disorder, unspecified (HCC) Long Term Goal(s): Improvement in symptoms so as ready for discharge   Short Term Goals: Ability to maintain clinical measurements within normal limits will improve Ability to identify triggers associated with substance abuse/mental health issues will improve  Medication Management: Evaluate patient's response, side effects, and tolerance of medication regimen.  Therapeutic Interventions: 1 to 1 sessions, Unit Group sessions and Medication administration.  Evaluation of Outcomes: Not Met  Physician  Treatment Plan for Secondary Diagnosis: Principal Problem:   Schizoaffective disorder, unspecified (HCC)  Long Term Goal(s): Improvement in symptoms so as ready for discharge   Short Term Goals: Ability to maintain clinical measurements within normal limits will improve Ability to identify triggers associated with substance abuse/mental health issues will improve     Medication Management: Evaluate patient's response, side effects, and tolerance of medication regimen.  Therapeutic Interventions: 1 to 1 sessions, Unit Group sessions and Medication administration.  Evaluation of Outcomes: Not Met   RN Treatment Plan for Primary Diagnosis: Schizoaffective disorder, unspecified (HCC) Long Term Goal(s): Knowledge of disease and therapeutic regimen to maintain health will improve  Short Term Goals: Ability to verbalize frustration and anger appropriately will improve, Ability to demonstrate self-control, Ability to participate in decision making will improve, Ability to verbalize feelings will improve, Ability to disclose and discuss suicidal ideas, and Ability to identify and develop effective coping behaviors will improve  Medication Management: RN will administer medications as ordered by provider, will assess and evaluate patient's response and provide education to patient for prescribed medication. RN will report any adverse and/or side effects to prescribing provider.  Therapeutic Interventions: 1 on 1 counseling sessions, Psychoeducation, Medication administration, Evaluate responses to treatment, Monitor vital signs and CBGs as ordered, Perform/monitor CIWA, COWS, AIMS and Fall Risk screenings as ordered, Perform wound care treatments as ordered.  Evaluation of Outcomes: Not Met   LCSW Treatment Plan for Primary Diagnosis: Schizoaffective disorder, unspecified (HCC) Long Term Goal(s): Safe transition to appropriate next level of care at discharge, Engage patient in therapeutic group  addressing interpersonal concerns.  Short Term Goals: Engage patient in aftercare planning with referrals and resources, Increase social support, Increase ability to appropriately verbalize feelings, Increase emotional regulation, Facilitate acceptance of mental health diagnosis and concerns, Facilitate patient progression through stages of change regarding substance use diagnoses and concerns, Identify triggers associated with mental health/substance abuse issues, and Increase skills for wellness and recovery  Therapeutic Interventions: Assess for all discharge needs, 1 to 1 time with Social worker, Explore available resources and support systems, Assess for adequacy in community support network, Educate family and significant other(s) on suicide prevention, Complete Psychosocial Assessment, Interpersonal group therapy.  Evaluation of Outcomes: Not Met   Progress in Treatment: Attending groups: No. Participating in groups: No. Taking medication as prescribed: Yes. Toleration medication: Yes. Family/Significant other contact made: No, will contact:  CSW to contact once permission is granted.  Patient understands diagnosis: Yes. and No. Discussing patient identified problems/goals with staff: Yes. and No. Medical problems stabilized or resolved: Yes. and No. Denies suicidal/homicidal ideation: Yes. and No. Issues/concerns per patient self-inventory: No. Other: None   New problem(s) identified: No, Describe:  None  New Short Term/Long Term Goal(s):detox, elimination of symptoms of psychosis, medication management for mood stabilization; elimination of SI thoughts; development of comprehensive mental wellness/sobriety plan.     Patient Goals:  I'm looking forward to going home.  Discharge Plan or Barriers: CSW to assist with the development of appropriate discharge plan.    Reason for Continuation of Hospitalization: Anxiety Delusions  Depression Suicidal ideation  Estimated  Length of Stay: 1-7 days.   Last 3 [place] Suicide Severity Risk Score: Flowsheet Row Admission (Current) from 12/15/2023 in [facility] INPATIENT BEHAV[facility]AL MEDICINE ED from 12/14/2023 in [facility] Emergency Department at [facility] ED from 11/27/2023 in [facility] Emergency Department at [facility]  C-SSRS RISK CATEGORY No Risk No Risk No Risk    Last [facility] 2/9 Scores:    01/04/2022   11:22 AM  Depression screen PHQ 2/9  Decreased Interest 2  Down, Depressed, Hopeless 1  PHQ - 2 Score 3  Altered sleeping 1  Tired, decreased energy 1  Change in appetite 1  Feeling bad or failure about yourself  1  Trouble concentrating 2  Moving slowly or fidgety/restless 3  Suicidal thoughts 0  PHQ-9 Score 12  Difficult doing work/chores Somewhat difficult    Scribe for Treatment Team: Alveta CHRISTELLA Kerns, LCSW 12/16/2023 11:40 AM

## 2023-12-16 NOTE — Group Note (Signed)
 Recreation Therapy Group Note   Group Topic:Leisure Education  Group Date: 12/16/2023 Start Time: 1530 End Time: 1630 Facilitators: Celestia Jeoffrey FORBES ARTICE, CTRS Location: Craft Room  Group Description: Leisure. Patients were given the option to choose from singing karaoke, coloring mandalas, using oil pastels, journaling, painting or playing with play-doh. LRT and pts discussed the meaning of leisure, the importance of participating in leisure during their free time/when they're outside of the hospital, as well as how our leisure interests can also serve as coping skills.   Goal Area(s) Addressed:  Patient will identify a current leisure interest.  Patient will learn the definition of "leisure". Patient will practice making a positive decision. Patient will have the opportunity to try a new leisure activity. Patient will communicate with peers and LRT.   Affect/Mood: N/A   Participation Level: Did not attend    Clinical Observations/Individualized Feedback: Patient did not attend group.   Plan: Continue to engage patient in RT group sessions 2-3x/week.   Jeoffrey FORBES Celestia, LRT, CTRS 12/16/2023 5:42 PM

## 2023-12-16 NOTE — Plan of Care (Signed)
   Problem: Education: Goal: Emotional status will improve Outcome: Progressing Goal: Mental status will improve Outcome: Progressing

## 2023-12-16 NOTE — Progress Notes (Signed)
 [facility] MD Progress Note  12/16/2023 4:22 PM [name]  MRN:  [id number]   Subjective:  Chart reviewed, case discussed in multidisciplinary meeting, patient seen during rounds.   Patient seen for follow-up today.  They are alert and oriented.  They are pleasant and cooperative on exam.  They feel that the insulin  has made him dizzy.  They deny hallucinations of all modalities today.  They are linear and logical.  They deny SI and HI.  They indicate they do not want to go home with insulin  they report they will follow-up with her outpatient provider.  Discussed that A1c and other lab results discussed need for insulin  and patient indicated they will follow-up with her primary care provider.  Spoke with Garnette Brunswick the ACT team lead who indicates they would follow-up with patient daily for 3 days following discharge.  Discussed plan to discharge tomorrow and he is agreeable and requested to be notified of discharge.    Also spoke with Dr. Malinda the ACT team and MD reviewed medications he was agreeable with the plan to continue home medications.  He noted in the past he had given her clonazepam .  He noted concern that when she is reporting that she is really reporting racing thoughts as he also acknowledges that she does not appear internally preoccupied or disorganized and reporting hallucinations.  There is also concern amongst the ACT team that the shaking she notes with multiple medications and psychosomatic.  Discussed insulin  and her need to follow-up with PCP.  He was agreeable with plan for discharge tomorrow and ACT team follow-up.   Sleep: Good  Appetite:  Fair  Past Psychiatric History: see h&P Family History:  Family History  Problem Relation Age of Onset   Hypertension Mother    Hypertension Father    Diabetes type II Other    Stomach cancer Cousin    Colon cancer Neg Hx    Colon polyps Neg Hx    Esophageal cancer Neg Hx    Rectal cancer Neg Hx    Social History:   Social History   Substance and Sexual Activity  Alcohol Use No     Social History   Substance and Sexual Activity  Drug Use Not Currently   Types: Marijuana   Comment: Former user    Social History   Socioeconomic History   Marital status: Single    Spouse name: Not on file   Number of children: Not on file   Years of education: Not on file   Highest education level: Not on file  Occupational History   Occupation: unemployed  Tobacco Use   Smoking status: Every Day    Current packs/day: 0.50    Average packs/day: 0.5 packs/day for 17.0 years (8.5 ttl pk-yrs)    Types: Cigarettes   Smokeless tobacco: Never  Vaping Use   Vaping status: Every Day  Substance and Sexual Activity   Alcohol use: No   Drug use: Not Currently    Types: Marijuana    Comment: Former user   Sexual activity: Not Currently  Other Topics Concern   Not on file  Social History Narrative   ** Merged History Encounter **       Social Drivers of Health   Financial Resource Strain: Not on file  Food Insecurity: No Food Insecurity (12/15/2023)   Hunger Vital Sign    Worried About Running Out of Food in the Last Year: Never true    Ran Out of Food in  the Last Year: Never true  Transportation Needs: No Transportation Needs (12/15/2023)   PRAPARE - Administrator, Civil Service (Medical): No    Lack of Transportation (Non-Medical): No  Physical Activity: Not on file  Stress: Not on file  Social Connections: Not on file   Past Medical History:  Past Medical History:  Diagnosis Date   Arthritis    Asthma    Bipolar 1 disorder (HCC)    Bronchitis    Depression    Hemorrhoid    Schizoaffective disorder (HCC)    Schizophrenia (HCC)    Pt denies and reports it is schizoaffective disorder.    Transfusion history     Past Surgical History:  Procedure Laterality Date   Arm surgery     CESAREAN SECTION     COLONOSCOPY     5-6 years ago per pt-[place] normal exam per pt     Current Medications: Current Facility-Administered Medications  Medication Dose Route Frequency Provider Last Rate Last Admin   albuterol  (VENTOLIN  HFA) 108 (90 Base) MCG/ACT inhaler 2 puff  2 puff Inhalation Q6H PRN Chett Taniguchi E, PA-C       alum & mag hydroxide-simeth (MAALOX/MYLANTA) 200-200-20 MG/5ML suspension 30 mL  30 mL Oral Q4H PRN McLauchlin, Angela, NP       amLODipine  (NORVASC ) tablet 5 mg  5 mg Oral Daily Dazia Lippold E, PA-C   5 mg at 12/15/23 1708   glipiZIDE  (GLUCOTROL ) tablet 5 mg  5 mg Oral QAC breakfast Liba Hulsey E, PA-C   5 mg at 12/16/23 9252   insulin  aspart (novoLOG ) injection 0-15 Units  0-15 Units Subcutaneous TID WC McLauchlin, Angela, NP   8 Units at 12/16/23 9251   insulin  aspart (novoLOG ) injection 0-5 Units  0-5 Units Subcutaneous QHS McLauchlin, Angela, NP   4 Units at 12/15/23 2047   magnesium  hydroxide (MILK OF MAGNESIA) suspension 30 mL  30 mL Oral Daily PRN McLauchlin, Angela, NP       OLANZapine  (ZYPREXA ) injection 10 mg  10 mg Intramuscular TID PRN McLauchlin, Jon, NP       OLANZapine  (ZYPREXA ) injection 5 mg  5 mg Intramuscular TID PRN McLauchlin, Angela, NP       OLANZapine  (ZYPREXA ) tablet 10 mg  10 mg Oral BID Laiza Veenstra E, PA-C   10 mg at 12/16/23 9253   OLANZapine  zydis (ZYPREXA ) disintegrating tablet 5 mg  5 mg Oral TID PRN McLauchlin, Angela, NP       primidone  (MYSOLINE ) tablet 100 mg  100 mg Oral BID Mykel Sponaugle E, PA-C       rosuvastatin  (CRESTOR ) tablet 20 mg  20 mg Oral Daily Fitz Matsuo E, PA-C   20 mg at 12/16/23 9253   venlafaxine  XR (EFFEXOR -XR) 24 hr capsule 37.5 mg  37.5 mg Oral Q breakfast Izabellah Dadisman E, PA-C   37.5 mg at 12/16/23 9253    Lab Results:  Results for orders placed or performed during the hospital encounter of 12/15/23 (from the past 48 hours)  Hemoglobin A1c     Status: Abnormal   Collection Time: 12/15/23  7:03 AM  Result Value Ref Range   Hgb A1c MFr Bld  12.8 (H) 4.8 - 5.6 %    Comment: (NOTE)         Prediabetes: 5.7 - 6.4         Diabetes: >6.4         Glycemic control for adults with diabetes: <7.0  Mean Plasma Glucose 321 mg/dL    Comment: (NOTE) Performed At: [facility] [address] [place], [place] [id number] Jennette Shorter MD Ey:[id number]   Glucose, capillary     Status: Abnormal   Collection Time: 12/15/23  8:16 AM  Result Value Ref Range   Glucose-Capillary 287 (H) 70 - 99 mg/dL    Comment: Glucose reference range applies only to samples taken after fasting for at least 8 hours.  Glucose, capillary     Status: Abnormal   Collection Time: 12/15/23 12:18 PM  Result Value Ref Range   Glucose-Capillary 355 (H) 70 - 99 mg/dL    Comment: Glucose reference range applies only to samples taken after fasting for at least 8 hours.  Glucose, capillary     Status: Abnormal   Collection Time: 12/15/23  4:47 PM  Result Value Ref Range   Glucose-Capillary 207 (H) 70 - 99 mg/dL    Comment: Glucose reference range applies only to samples taken after fasting for at least 8 hours.   Comment 1 Notify RN   Glucose, capillary     Status: Abnormal   Collection Time: 12/15/23  7:33 PM  Result Value Ref Range   Glucose-Capillary 304 (H) 70 - 99 mg/dL    Comment: Glucose reference range applies only to samples taken after fasting for at least 8 hours.  Glucose, capillary     Status: Abnormal   Collection Time: 12/16/23  7:46 AM  Result Value Ref Range   Glucose-Capillary 253 (H) 70 - 99 mg/dL    Comment: Glucose reference range applies only to samples taken after fasting for at least 8 hours.  Glucose, capillary     Status: Abnormal   Collection Time: 12/16/23 11:31 AM  Result Value Ref Range   Glucose-Capillary 169 (H) 70 - 99 mg/dL    Comment: Glucose reference range applies only to samples taken after fasting for at least 8 hours.    Blood Alcohol level:  Lab Results  Component Value Date   [facility] <15 12/14/2023   ETH  <10 07/09/2021    Metabolic Disorder Labs: Lab Results  Component Value Date   HGBA1C 12.8 (H) 12/15/2023   MPG 321 12/15/2023   MPG 134.11 01/30/2017   No results found for: PROLACTIN Lab Results  Component Value Date   CHOL 193 11/27/2019   TRIG 75 11/27/2019   HDL 54 11/27/2019   CHOLHDL 3.6 11/27/2019   VLDL 15 11/27/2019   LDLCALC 124 (H) 11/27/2019   LDLCALC 93 01/29/2017    Physical Findings: AIMS:  , ,  ,  ,    CIWA:    COWS:      Psychiatric Specialty Exam:  Presentation  General Appearance:  Casual  Eye Contact: Fair  Speech: Clear and Coherent  Speech Volume: Normal    Mood and Affect  Mood: Euthymic  Affect: Congruent   Thought Process  Thought Processes: Coherent  Descriptions of Associations:Intact  Orientation:Full (Time, Place and Person)  Thought Content:WDL  Hallucinations:Hallucinations: Auditory Description of Auditory Hallucinations: Telling her she is going to die  Ideas of Reference:None  Suicidal Thoughts:Suicidal Thoughts: No  Homicidal Thoughts:Homicidal Thoughts: No   Sensorium  Memory: Immediate Fair; Recent Fair  Judgment:No data recorded Insight: Shallow   Executive Functions  Concentration: Fair  Attention Span: Fair  Recall: [employer] of Knowledge: Fair  Language: Fair   Psychomotor Activity  Psychomotor Activity: Psychomotor Activity: Normal  Musculoskeletal: Strength & Muscle Tone: within normal limits Gait &  Station: normal Assets  Assets: Manufacturing systems engineer; Desire for Improvement; Housing    Physical Exam: Physical Exam Vitals and nursing note reviewed.  Constitutional:      Appearance: She is obese.  HENT:     Head: Atraumatic.  Eyes:     Extraocular Movements: Extraocular movements intact.  Pulmonary:     Effort: Pulmonary effort is normal.  Neurological:     Mental Status: She is alert and oriented to person, place, and time.    Review of Systems   Psychiatric/Behavioral:  Negative for depression, hallucinations, memory loss, substance abuse and suicidal ideas. The patient is nervous/anxious. The patient does not have insomnia.    Blood pressure 124/89, pulse (!) 52, temperature (!) 97.2 F (36.2 C), resp. rate 20, height 5' 4 (1.626 m), weight 126.6 kg. Body mass index is 47.89 kg/m.  Diagnosis: Principal Problem:   Schizoaffective disorder, unspecified (HCC)   PLAN: Safety and Monitoring:  -- Voluntary admission to inpatient psychiatric unit for safety, stabilization and treatment  -- Daily contact with patient to assess and evaluate symptoms and progress in treatment  -- Patient's case to be discussed in multi-disciplinary team meeting  -- Observation Level : q15 minute checks  -- Vital signs:  q12 hours  -- Precautions: suicide, elopement, and assault -- Encouraged patient to participate in unit milieu and in scheduled group therapies  2. Psychiatric Diagnoses and Treatment:  Schizoaffective disorder:  Restart home meds Zyprexa  10 mg twice daily Pristiq 25 mg daily is off formulary converts to Effexor  37.5 mg daily     -- The risks/benefits/side-effects/alternatives to this medication were discussed in detail with the patient and time was given for questions. The patient consents to medication trial.                -- Metabolic profile and EKG monitoring obtained while on an atypical antipsychotic (BMI: Lipid Panel: HbgA1c: QTc:)              -- Encouraged patient to participate in unit milieu and in scheduled group therapies                            3. Medical Issues Being Addressed:  Amlodipine  5 mg daily, glipizide  5 mg daily, primidone  100 mg 3 times daily, and rosuvastatin  20 mg daily are restarted the medications were confirmed with the medications that she brought with her.  Diabetes coordination was consulted and assisted with insulin  and glipizide  management.  Patient is declining insulin  at discharge.    4.  Discharge Planning:   -- Social work and case management to assist with discharge planning and identification of hospital follow-up needs prior to discharge  -- Estimated LOS: 3-4 days  Donnice FORBES Right, PA-C 12/16/2023, 4:22 PM

## 2023-12-17 LAB — GLUCOSE, CAPILLARY
Glucose-Capillary: 192 mg/dL — ABNORMAL HIGH (ref 70–99)
Glucose-Capillary: 374 mg/dL — ABNORMAL HIGH (ref 70–99)

## 2023-12-17 MED ORDER — NICOTINE 14 MG/24HR TD PT24: 14.0000 mg | MEDICATED_PATCH | Freq: Every day | TRANSDERMAL | Status: DC

## 2023-12-17 NOTE — BHH Suicide Risk Assessment (Signed)
 [facility] Discharge Suicide Risk Assessment   Principal Problem: Schizoaffective disorder, unspecified (HCC) Discharge Diagnoses: Principal Problem:   Schizoaffective disorder, unspecified (HCC)   Total Time spent with patient: 1 hour  Musculoskeletal: Strength & Muscle Tone: within normal limits Gait & Station: normal Patient leans: N/A  Psychiatric Specialty Exam  Presentation  General Appearance:  Casual  Eye Contact: Fair  Speech: Clear and Coherent  Speech Volume: Normal  Handedness:No data recorded  Mood and Affect  Mood: Euthymic  Duration of Depression Symptoms: No data recorded Affect: Congruent   Thought Process  Thought Processes: Coherent  Descriptions of Associations:Intact  Orientation:Full (Time, Place and Person)  Thought Content:WDL  History of Schizophrenia/Schizoaffective disorder:No data recorded Duration of Psychotic Symptoms:No data recorded Hallucinations:No data recorded Ideas of Reference:None  Suicidal Thoughts:No data recorded Homicidal Thoughts:No data recorded  Sensorium  Memory: Immediate Fair; Recent Fair  Judgment:No data recorded Insight: Shallow   Executive Functions  Concentration: Fair  Attention Span: Fair  Recall: [employer] of Knowledge: Fair  Language: Fair   Psychomotor Activity  Psychomotor Activity:No data recorded  Assets  Assets: Communication Skills; Desire for Improvement; Housing   Sleep  Sleep:No data recorded Estimated Sleeping Duration (Last 24 Hours): 8.00 hours  Physical Exam: Physical Exam Vitals and nursing note reviewed.  HENT:     Head: Atraumatic.  Eyes:     Extraocular Movements: Extraocular movements intact.  Pulmonary:     Effort: Pulmonary effort is normal.  Neurological:     Mental Status: She is alert and oriented to person, place, and time.  Psychiatric:        Mood and Affect: Mood normal.        Behavior: Behavior normal.        Thought Content:  Thought content normal.        Judgment: Judgment normal.    Review of Systems  Psychiatric/Behavioral:  Negative for depression, hallucinations, memory loss, substance abuse and suicidal ideas. The patient is not nervous/anxious and does not have insomnia.    Blood pressure 116/72, pulse 63, temperature (!) 97.3 F (36.3 C), resp. rate 20, height 5' 4 (1.626 m), weight 126.6 kg. Body mass index is 47.89 kg/m.  Mental Status Per Nursing Assessment::   On Admission:  NA  Demographic Factors:  Low socioeconomic status  Loss Factors: NA  Historical Factors: Impulsivity  Risk Reduction Factors:   Positive social support, Positive therapeutic relationship, and ACTT serivices  Continued Clinical Symptoms:  Previous Psychiatric Diagnoses and Treatments Medical Diagnoses and Treatments/Surgeries  Cognitive Features That Contribute To Risk:  None    Suicide Risk:  Minimal: No identifiable suicidal ideation.  Patients presenting with no risk factors but with morbid ruminations; may be classified as minimal risk based on the severity of the depressive symptoms   Follow-up Information     [facility] Call.   Why: Walk in Hours:  Monday- Friday 8:00 amd - 5:00 pm  Call to make appointment on 12/20/23 Contact information: [address] [place] [address] [phone]                 Plan Of Care/Follow-up recommendations:  # It is recommended to the patient to continue psychiatric medications as prescribed, after discharge from the hospital.   # It is recommended to the patient to follow up with your outpatient psychiatric provider and PCP. # It was discussed with the patient, the impact of alcohol, drugs, tobacco have been there overall psychiatric and  medical wellbeing, and total abstinence from substance use was recommended. # Prescriptions provided or sent directly to preferred pharmacy at discharge. Patient agreeable to plan. Given the  opportunity to ask questions. Appears to feel comfortable with discharge.  # In the event of worsening symptoms, the patient is instructed to call the crisis hotline (988), 911 and or go to the nearest ED for appropriate evaluation and treatment of symptoms. To follow-up with primary care provider for other medical issues, concerns and or health care needs # Patient was discharged home as requested with a plan to follow up as noted above.    Donnice FORBES Right, PA-C 12/17/2023, 12:59 PM

## 2023-12-17 NOTE — Plan of Care (Signed)
   Problem: Education: Goal: Knowledge of [facility] Education information/materials will improve Outcome: Progressing Goal: Emotional status will improve Outcome: Progressing Goal: Mental status will improve Outcome: Progressing Goal: Verbalization of understanding the information provided will improve Outcome: Progressing

## 2023-12-17 NOTE — Discharge Summary (Signed)
 Physician Discharge Summary Note  Patient:  [name] is an 53 y.o., female MRN:  [id number] DOB:  [birth year] Patient phone:  [phone] (home)  Patient address:   [address] [place] [address],   Total time spent: 40 min Date of Admission:  12/15/2023 Date of Discharge: 12/17/2023  Reason for Admission:  The patient is a 53 year old female with a longstanding psychiatric history of psychotic disorder who presented voluntarily for evaluation due to worsening auditory hallucinations. She reported hearing voices telling her that her life was going to end and instructing her not to engage in undisclosed activities. She denied suicidal or homicidal ideation, visual hallucinations, and substance intoxication. She endorsed significant psychosocial stressors related to her children, which exacerbated her psychiatric symptoms. She has a history of multiple antipsychotic trials and is currently followed by an ACT team under the care of Dr. Malinda.  Principal Problem: Schizoaffective disorder, unspecified (HCC) Discharge Diagnoses: Principal Problem:   Schizoaffective disorder, unspecified (HCC)   Past Psychiatric History: See H&P  Family Psychiatric  History: See H&P Social History:  Social History   Substance and Sexual Activity  Alcohol Use No     Social History   Substance and Sexual Activity  Drug Use Not Currently   Types: Marijuana   Comment: Former user    Social History   Socioeconomic History   Marital status: Single    Spouse name: Not on file   Number of children: Not on file   Years of education: Not on file   Highest education level: Not on file  Occupational History   Occupation: unemployed  Tobacco Use   Smoking status: Every Day    Current packs/day: 0.50    Average packs/day: 0.5 packs/day for 17.0 years (8.5 ttl pk-yrs)    Types: Cigarettes   Smokeless tobacco: Never  Vaping Use   Vaping status: Every Day  Substance and Sexual  Activity   Alcohol use: No   Drug use: Not Currently    Types: Marijuana    Comment: Former user   Sexual activity: Not Currently  Other Topics Concern   Not on file  Social History Narrative   ** Merged History Encounter **       Social Drivers of Health   Financial Resource Strain: Not on file  Food Insecurity: No Food Insecurity (12/15/2023)   Hunger Vital Sign    Worried About Running Out of Food in the Last Year: Never true    Ran Out of Food in the Last Year: Never true  Transportation Needs: No Transportation Needs (12/15/2023)   PRAPARE - Administrator, Civil Service (Medical): No    Lack of Transportation (Non-Medical): No  Physical Activity: Not on file  Stress: Not on file  Social Connections: Not on file   Past Medical History:  Past Medical History:  Diagnosis Date   Arthritis    Asthma    Bipolar 1 disorder (HCC)    Bronchitis    Depression    Hemorrhoid    Schizoaffective disorder (HCC)    Schizophrenia (HCC)    Pt denies and reports it is schizoaffective disorder.    Transfusion history     Past Surgical History:  Procedure Laterality Date   Arm surgery     CESAREAN SECTION     COLONOSCOPY     5-6 years ago per pt-near [place] normal exam per pt   Family History:  Family History  Problem Relation Age of  Onset   Hypertension Mother    Hypertension Father    Diabetes type II Other    Stomach cancer Cousin    Colon cancer Neg Hx    Colon polyps Neg Hx    Esophageal cancer Neg Hx    Rectal cancer Neg Hx     Hospital Course:    Upon admission, the patient was evaluated and placed on appropriate safety precautions. She was cooperative with staff and oriented to person, place, and time. She endorsed AH and paranoia on initial evaluation, though denied suicidal or homicidal ideation.  The ACT team was contacted early in her admission. Communication with the ACT team lead confirmed that the patient tends to present annually with  similar complaints, typically near her baseline. Case was reviewed with Dr. Malinda, the ACTT psychiatrist, reviewed her medications, history, and curreint presentation.He affirmed the plan to continue home regimens, including Zyprexa  10 mg twice daily and conversion of Pristiq 25 mg daily to Effexor  XR 37.5 mg daily due to formulary limitations. He also noted that the patient historically had clonazepam  trials and sometimes describes racing thoughts as "hallucinations." Both he and the ACT team lead supported her discharge plan with daily follow-up for three days.  During the course of hospitalization, the patient received daily multiple modalities of treatment consisting of psychopharmacology, individual, group, psychoeducational, recreational, and milieu therapy, as well as case management to coordinate her inpatient and outpatient care. Treatment planning was carried out in collaboration with weekly multidisciplinary team meetings. Discharge planning was initiated on the day of admission to ensure safe transition to the community.  The patient was restarted on her home medical regimen including amlodipine  5 mg daily, glipizide  5 mg daily, primidone  100 mg three times daily, and rosuvastatin  20 mg daily. Diabetes coordination was consulted and assisted with glipizide  and insulin  management. The patient expressed dizziness when given insulin  and strongly preferred to avoid it at discharge, agreeing instead to follow-up with her primary care provider for diabetes management. Significant education was given about diabetes status given her current A1c and ongoing need for antipsychotics, patient verbalized understanding.   Over the course of her stay, the patient's psychotic symptoms abated. On repeat evaluations she denied hallucinations of all modalities, and continued to deny suicidal or homicidal ideation. She remained calm, pleasant, and logical on exam. No self-harm behaviors were observed, and she was not  noted to be responding to internal stimuli.  A detailed risk assessment was completed, indicating low acute risk of suicide or violence. All modifiable risk factors for harm to self or others were addressed during admission. At the time of discharge, the patient was psychiatrically stable, demonstrated insight into her illness, and expressed understanding of her treatment plan. She verbalized commitment to follow-up care and identified the ACT team as an ongoing support.  Currently, all modifiable risk of harm to self or others have been addressed, and the patient is no longer appropriate for the acute inpatient setting. She is able to continue treatment for mental health needs in the community with the supports arranged. She was educated regarding her discharge plan, medications, follow-up appointments, crisis resources, and instructions to call 911 or present to the nearest emergency department should her condition worsen or suicidal/homicidal thoughts return. She verbalized understanding and agreed to this plan of care.  Physical Findings: AIMS:  , ,  ,  ,    CIWA:    COWS:        Psychiatric Specialty Exam:  Presentation  General  Appearance:  Casual  Eye Contact: Fair  Speech: Clear and Coherent  Speech Volume: Normal    Mood and Affect  Mood: Euthymic  Affect: Congruent   Thought Process  Thought Processes: Coherent  Descriptions of Associations:Intact  Orientation:Full (Time, Place and Person)  Thought Content:WDL  Hallucinations:No data recorded Ideas of Reference:None  Suicidal Thoughts:No data recorded Homicidal Thoughts:No data recorded  Sensorium  Memory: Immediate Fair; Recent Fair  Judgment:No data recorded Insight: Shallow   Executive Functions  Concentration: Fair  Attention Span: Fair  Recall: [employer] of Knowledge: Fair  Language: Fair   Psychomotor Activity  Psychomotor Activity:No data  recorded Musculoskeletal: Strength & Muscle Tone: within normal limits Gait & Station: normal Assets  Assets: Manufacturing systems engineer; Desire for Improvement; Housing   Sleep  Sleep:No data recorded   Physical Exam: Physical Exam Vitals and nursing note reviewed.  Constitutional:      Appearance: She is obese.  HENT:     Head: Atraumatic.  Eyes:     Extraocular Movements: Extraocular movements intact.  Pulmonary:     Effort: Pulmonary effort is normal.  Neurological:     Mental Status: She is alert and oriented to person, place, and time.  Psychiatric:        Mood and Affect: Mood normal.        Behavior: Behavior normal.        Thought Content: Thought content normal.        Judgment: Judgment normal.    Review of Systems  Psychiatric/Behavioral:  Negative for hallucinations, substance abuse and suicidal ideas. The patient is not nervous/anxious and does not have insomnia.    Blood pressure 116/72, pulse 63, temperature (!) 97.3 F (36.3 C), resp. rate 20, height 5' 4 (1.626 m), weight 126.6 kg. Body mass index is 47.89 kg/m.   Social History   Tobacco Use  Smoking Status Every Day   Current packs/day: 0.50   Average packs/day: 0.5 packs/day for 17.0 years (8.5 ttl pk-yrs)   Types: Cigarettes  Smokeless Tobacco Never   Tobacco Cessation:  A prescription for an FDA-approved tobacco cessation medication was offered at discharge and the patient refused   Blood Alcohol level:  Lab Results  Component Value Date   [facility] <15 12/14/2023   ETH <10 07/09/2021    Metabolic Disorder Labs:  Lab Results  Component Value Date   HGBA1C 12.8 (H) 12/15/2023   MPG 321 12/15/2023   MPG 134.11 01/30/2017   No results found for: PROLACTIN Lab Results  Component Value Date   CHOL 193 11/27/2019   TRIG 75 11/27/2019   HDL 54 11/27/2019   CHOLHDL 3.6 11/27/2019   VLDL 15 11/27/2019   LDLCALC 124 (H) 11/27/2019   LDLCALC 93 01/29/2017    See Psychiatric Specialty  Exam and Suicide Risk Assessment completed by Attending Physician prior to discharge.  Discharge destination:  Home  Is patient on multiple antipsychotic therapies at discharge:  No   Has Patient had three or more failed trials of antipsychotic monotherapy by history:  No  Recommended Plan for Multiple Antipsychotic Therapies: NA   Allergies as of 12/17/2023       Reactions   Latuda  [lurasidone  Hcl] Nausea And Vomiting, Other (See Comments)   Shaking, also   Abilify  [aripiprazole ] Other (See Comments)   Shaking   Flagyl  [metronidazole  Hcl] Nausea And Vomiting   Metformin  And Related Diarrhea   Prolixin  [fluphenazine ] Other (See Comments)   Shaking   Risperidone  And Paliperidone Other (  See Comments)   Shaking   Seroquel  [quetiapine  Fumarate] Other (See Comments)   Patient states she had a syncopal episode after taking medication.    Sulfa Antibiotics Nausea And Vomiting        Medication List     STOP taking these medications    naproxen  sodium 220 MG tablet Commonly known as: ALEVE        TAKE these medications      Indication  albuterol  108 (90 Base) MCG/ACT inhaler Commonly known as: VENTOLIN  HFA Inhale 2 puffs into the lungs every 6 (six) hours as needed for wheezing or shortness of breath.  Indication: Spasm of Lung Air Passages   amLODipine  5 MG tablet Commonly known as: NORVASC  Take 5 mg by mouth daily.  Indication: High Blood Pressure   desvenlafaxine 25 MG 24 hr tablet Commonly known as: PRISTIQ Take 25 mg by mouth daily.  Indication: Major Depressive Disorder   glipiZIDE  5 MG tablet Commonly known as: GLUCOTROL  Take 5 mg by mouth daily before breakfast.  Indication: Type 2 Diabetes   OLANZapine  10 MG tablet Commonly known as: ZYPREXA  Take 10 mg by mouth in the morning and at bedtime.  Indication: schizoaffective   primidone  50 MG tablet Commonly known as: MYSOLINE  Take 2 tablets (100 mg total) by mouth 3 (three) times daily.  Indication:  Fine to Coarse Slow Tremor Affecting Head, Hands & Voice   rosuvastatin  20 MG tablet Commonly known as: CRESTOR  Take 20 mg by mouth daily.  Indication: High Amount of Fats in the Blood        Follow-up Information     [facility] Call.   Why: Walk in Hours:  Monday- Friday 8:00 amd - 5:00 pm  Call to make appointment on 12/20/23 Contact information: [address] [place] [address] [phone]                 Follow-up recommendations:    # It is recommended to the patient to continue psychiatric medications as prescribed, after discharge from the hospital.   # It is recommended to the patient to follow up with your outpatient psychiatric provider and PCP. # It was discussed with the patient, the impact of alcohol, drugs, tobacco have been there overall psychiatric and medical wellbeing, and total abstinence from substance use was recommended. # Prescriptions provided or sent directly to preferred pharmacy at discharge. Patient agreeable to plan. Given the opportunity to ask questions. Appears to feel comfortable with discharge.  # In the event of worsening symptoms, the patient is instructed to call the crisis hotline (988), 911 and or go to the nearest ED for appropriate evaluation and treatment of symptoms. To follow-up with primary care provider for other medical issues, concerns and or health care needs # Patient was discharged home as requested with a plan to follow up as noted above.      Signed: Donnice FORBES Right, PA-C 12/17/2023, 1:01 PM

## 2023-12-17 NOTE — Group Note (Signed)
 Date:  12/17/2023 Time:  1:39 AM   Additional Comments:  Did not attend group.  [name] 12/17/2023, 1:39 AM

## 2023-12-17 NOTE — Group Note (Deleted)
 Date:  12/17/2023 Time:  1:05 AM  Group Topic/Focus:  Goals Group:   The focus of this group is to help patients establish daily goals to achieve during treatment and discuss how the patient can incorporate goal setting into their daily lives to aide in recovery. Healthy Communication:   The focus of this group is to discuss communication, barriers to communication, as well as healthy ways to communicate with others.     Participation Level:  {BHH PARTICIPATION OZCZO:77735}  Participation Quality:  {BHH PARTICIPATION QUALITY:22265}  Affect:  {BHH AFFECT:22266}  Cognitive:  {BHH COGNITIVE:22267}  Insight: {BHH Insight2:20797}  Engagement in Group:  {BHH ENGAGEMENT IN HMNLE:77731}  Modes of Intervention:  {BHH MODES OF INTERVENTION:22269}  Additional Comments:  ***  [name] 12/17/2023, 1:05 AM

## 2023-12-17 NOTE — Progress Notes (Signed)
  [facility] Adult Case Management Discharge Plan :  Will you be returning to the same living situation after discharge:  Yes,  [address] [place] At discharge, do you have transportation home?: No. Do you have the ability to pay for your medications: Yes,  The patient stated yes.  Release of information consent forms completed and in the chart;  Patient's signature needed at discharge.  Patient to Follow up at:  Follow-up Information     [facility] Call.   Why: Walk in Hours:  Monday- Friday 8:00 amd - 5:00 pm  Call to make appointment on 12/20/23 Contact information: [address] [place] [address] [phone]                 Next level of care provider has access to [facility] Link:yes  Safety Planning and Suicide Prevention discussed: Yes,  Discussed with the patient because she declined.     Has patient been referred to the Quitline?: Patient refused referral for treatment  Patient has been referred for addiction treatment: Yes, referral information given but appointment not made [facility]  (list facility).  Roselyn GORMAN Lento, LCSW 12/17/2023, 10:24 AM

## 2023-12-17 NOTE — Progress Notes (Signed)
 Patient belongings  returned and discharge instructions reviewed with patient. Patient is calm, alert and oriented. Walked to medical mall to where she was transferred to her destination by [employer].

## 2023-12-17 NOTE — Progress Notes (Signed)
   12/16/23 2000  Psych Admission Type (Psych Patients Only)  Admission Status Voluntary  Psychosocial Assessment  Patient Complaints None  Eye Contact Fair  Facial Expression Flat  Affect Apprehensive  Speech Soft  Interaction Cautious;No initiation;Guarded  Motor Activity Slow  Appearance/Hygiene Improved  Behavior Characteristics Cooperative;Appropriate to situation  Mood Anxious  Thought Process  Coherency WDL  Content WDL  Delusions None reported or observed  Perception WDL  Hallucination None reported or observed  Judgment WDL  Confusion WDL  Danger to Self  Current suicidal ideation? Denies  Agreement Not to Harm Self Yes  Description of Agreement Verbal  Danger to Others  Danger to Others None reported or observed   No distress noted, affect is congruent with mood. Patient denies SI/HI/AVH no distress noted, 15 minutes safety checks maintained.

## 2023-12-20 ENCOUNTER — Emergency Department (HOSPITAL_COMMUNITY)
Admission: EM | Admit: 2023-12-20 | Discharge: 2023-12-20 | Disposition: A | Discharge status: Home / Self Care | Payer: MEDICAID | Attending: Emergency Medicine | Admitting: Emergency Medicine

## 2023-12-20 ENCOUNTER — Encounter (HOSPITAL_COMMUNITY): Payer: Self-pay | Admitting: Emergency Medicine

## 2023-12-20 ENCOUNTER — Ambulatory Visit (HOSPITAL_BASED_OUTPATIENT_CLINIC_OR_DEPARTMENT_OTHER)
Admission: RE | Admit: 2023-12-20 | Discharge: 2023-12-20 | Disposition: A | Discharge status: Home / Self Care | Payer: MEDICAID | Source: Ambulatory Visit | Attending: Emergency Medicine | Admitting: Emergency Medicine

## 2023-12-20 ENCOUNTER — Other Ambulatory Visit: Payer: Self-pay

## 2023-12-20 DIAGNOSIS — F17210 Nicotine dependence, cigarettes, uncomplicated: Secondary | ICD-10-CM | POA: Insufficient documentation

## 2023-12-20 DIAGNOSIS — M79604 Pain in right leg: Secondary | ICD-10-CM | POA: Diagnosis present

## 2023-12-20 DIAGNOSIS — M79605 Pain in left leg: Secondary | ICD-10-CM | POA: Diagnosis present

## 2023-12-20 DIAGNOSIS — E1165 Type 2 diabetes mellitus with hyperglycemia: Secondary | ICD-10-CM | POA: Insufficient documentation

## 2023-12-20 DIAGNOSIS — J45909 Unspecified asthma, uncomplicated: Secondary | ICD-10-CM | POA: Diagnosis not present

## 2023-12-20 DIAGNOSIS — M7989 Other specified soft tissue disorders: Secondary | ICD-10-CM | POA: Diagnosis not present

## 2023-12-20 DIAGNOSIS — R739 Hyperglycemia, unspecified: Secondary | ICD-10-CM

## 2023-12-20 HISTORY — DX: Type 2 diabetes mellitus without complications: E11.9

## 2023-12-20 LAB — CBC WITH DIFFERENTIAL/PLATELET
Basophils Absolute: 0.2 K/uL — ABNORMAL HIGH (ref 0.0–0.1)
Basophils Relative: 2 %
Eosinophils Absolute: 0.2 K/uL (ref 0.0–0.5)
Eosinophils Relative: 2 %
HCT: 34.2 % — ABNORMAL LOW (ref 36.0–46.0)
Hemoglobin: 10.9 g/dL — ABNORMAL LOW (ref 12.0–15.0)
Lymphocytes Relative: 42 %
Lymphs Abs: 4.3 K/uL — ABNORMAL HIGH (ref 0.7–4.0)
MCH: 26.7 pg (ref 26.0–34.0)
MCHC: 31.9 g/dL (ref 30.0–36.0)
MCV: 83.8 fL (ref 80.0–100.0)
Monocytes Absolute: 0.4 K/uL (ref 0.1–1.0)
Monocytes Relative: 4 %
Neutro Abs: 5.2 K/uL (ref 1.7–7.7)
Neutrophils Relative %: 50 %
Platelets: 319 K/uL (ref 150–400)
RBC: 4.08 MIL/uL (ref 3.87–5.11)
RDW: 14.7 % (ref 11.5–15.5)
WBC: 10.3 K/uL (ref 4.0–10.5)
nRBC: 0 % (ref 0.0–0.2)

## 2023-12-20 LAB — COMPREHENSIVE METABOLIC PANEL WITH GFR
ALT: 18 U/L (ref 0–44)
AST: 19 U/L (ref 15–41)
Albumin: 3.7 g/dL (ref 3.5–5.0)
Alkaline Phosphatase: 104 U/L (ref 38–126)
Anion gap: 12 (ref 5–15)
BUN: 5 mg/dL — ABNORMAL LOW (ref 6–20)
CO2: 22 mmol/L (ref 22–32)
Calcium: 9.1 mg/dL (ref 8.9–10.3)
Chloride: 97 mmol/L — ABNORMAL LOW (ref 98–111)
Creatinine, Ser: 0.87 mg/dL (ref 0.44–1.00)
GFR, Estimated: >60 mL/min (ref >60)
Glucose, Bld: 349 mg/dL — ABNORMAL HIGH (ref 70–99)
Potassium: 3.8 mmol/L (ref 3.5–5.1)
Sodium: 131 mmol/L — ABNORMAL LOW (ref 135–145)
Total Bilirubin: 0.4 mg/dL (ref 0.0–1.2)
Total Protein: 7.8 g/dL (ref 6.5–8.1)

## 2023-12-20 LAB — CBG MONITORING, ED: Glucose-Capillary: 311 mg/dL — ABNORMAL HIGH (ref 70–99)

## 2023-12-20 LAB — HCG, SERUM, QUALITATIVE: Preg, Serum: NEGATIVE

## 2023-12-20 NOTE — ED Triage Notes (Signed)
 Patient arrived with EMS from home reports hyperglycemis , CBG=397, takes Glipizide  , denies fever or chills , respirations unlabored.

## 2023-12-20 NOTE — Discharge Instructions (Addendum)
 You were evaluated in the Emergency Department and after careful evaluation, we did not find any emergent condition requiring admission or further testing in the hospital.  Your exam/testing today is overall reassuring.  Follow-up with your primary care doctor to discuss your blood sugar management.  Recommend returning to [facility] later in the morning for ultrasound as we discussed.  Please return to the Emergency Department if you experience any worsening of your condition.   Thank you for allowing us  to be a part of your care.

## 2023-12-20 NOTE — ED Provider Notes (Signed)
 [facility] Emergency Department Provider Note MRN:  [id number]  Arrival date & time: 12/20/23     Chief Complaint   Hyperglycemia   History of Present Illness   [name] is a 53 y.o. year-old female with a history of diabetes presenting to the ED with chief complaint of hyperglycemia.  Having blood sugars in the 3-4 100s persistently over the past few weeks.  Recently diagnosed with diabetes.  Denies any fever, no cough, no burning with urination, having some leg pain bilaterally today.  Review of Systems  A thorough review of systems was obtained and all systems are negative except as noted in the HPI and PMH.   Patient's Health History    Past Medical History:  Diagnosis Date   Arthritis    Asthma    Bipolar 1 disorder (HCC)    Bronchitis    Depression    Diabetes mellitus without complication (HCC)    Hemorrhoid    Schizoaffective disorder (HCC)    Schizophrenia (HCC)    Pt denies and reports it is schizoaffective disorder.    Transfusion history     Past Surgical History:  Procedure Laterality Date   Arm surgery     CESAREAN SECTION     COLONOSCOPY     5-6 years ago per pt-near [place] normal exam per pt    Family History  Problem Relation Age of Onset   Hypertension Mother    Hypertension Father    Diabetes type II Other    Stomach cancer Cousin    Colon cancer Neg Hx    Colon polyps Neg Hx    Esophageal cancer Neg Hx    Rectal cancer Neg Hx     Social History   Socioeconomic History   Marital status: Single    Spouse name: Not on file   Number of children: Not on file   Years of education: Not on file   Highest education level: Not on file  Occupational History   Occupation: unemployed  Tobacco Use   Smoking status: Every Day    Current packs/day: 0.50    Average packs/day: 0.5 packs/day for 17.0 years (8.5 ttl pk-yrs)    Types: Cigarettes   Smokeless tobacco: Never  Vaping Use   Vaping status: Every  Day  Substance and Sexual Activity   Alcohol use: No   Drug use: Not Currently    Types: Marijuana    Comment: Former user   Sexual activity: Not Currently  Other Topics Concern   Not on file  Social History Narrative   ** Merged History Encounter **       Social Drivers of Health   Financial Resource Strain: Not on file  Food Insecurity: No Food Insecurity (12/15/2023)   Hunger Vital Sign    Worried About Running Out of Food in the Last Year: Never true    Ran Out of Food in the Last Year: Never true  Transportation Needs: No Transportation Needs (12/15/2023)   PRAPARE - Administrator, Civil Service (Medical): No    Lack of Transportation (Non-Medical): No  Physical Activity: Not on file  Stress: Not on file  Social Connections: Not on file  Intimate Partner Violence: Not At Risk (12/15/2023)   Humiliation, Afraid, Rape, and Kick questionnaire    Fear of Current or Ex-Partner: No    Emotionally Abused: No    Physically Abused: No    Sexually Abused: No     Physical  Exam   Vitals:   12/20/23 0107 12/20/23 0130  BP: 125/74 119/62  Pulse: 72 69  Resp: 18   Temp: (!) 97.5 F (36.4 C)   SpO2: 93% 97%    CONSTITUTIONAL: Well-appearing, NAD NEURO/PSYCH:  Alert and oriented x 3, no focal deficits EYES:  eyes equal and reactive ENT/NECK:  no LAD, no JVD CARDIO: Regular rate, well-perfused, normal S1 and S2 PULM:  CTAB no wheezing or rhonchi GI/GU:  non-distended, non-tender MSK/SPINE:  No gross deformities, no edema SKIN:  no rash, atraumatic   *Additional and/or pertinent findings included in MDM below  Diagnostic and Interventional Summary    EKG Interpretation Date/Time:  Tuesday December 20 2023 01:53:10 EDT Ventricular Rate:  68 PR Interval:  151 QRS Duration:  105 QT Interval:  384 QTC Calculation: 409 R Axis:   83  Text Interpretation: Sinus rhythm Anteroseptal infarct, age indeterminate Minimal ST elevation, inferior leads Lateral leads  are also involved Confirmed by Theadore Sharper [phone]) on 12/20/2023 2:27:06 AM       Labs Reviewed  CBC WITH DIFFERENTIAL/PLATELET - Abnormal; Notable for the following components:      Result Value   Hemoglobin 10.9 (*)    HCT 34.2 (*)    Lymphs Abs 4.3 (*)    Basophils Absolute 0.2 (*)    All other components within normal limits  COMPREHENSIVE METABOLIC PANEL WITH GFR - Abnormal; Notable for the following components:   Sodium 131 (*)    Chloride 97 (*)    Glucose, Bld 349 (*)    BUN 5 (*)    All other components within normal limits  CBG MONITORING, ED - Abnormal; Notable for the following components:   Glucose-Capillary 311 (*)    All other components within normal limits  HCG, SERUM, QUALITATIVE    LE VENOUS    (Results Pending)    Medications - No data to display   Procedures  /  Critical Care Procedures  ED Course and Medical Decision Making  Initial Impression and Ddx Hyperglycemia, new diagnosis of diabetes.  On glipizide .  Screening labs to ensure no evidence of DKA.  Minimal other complaints, no signs or symptoms of infection.  Having some leg pain bilaterally but no erythema or increased warmth, normal range of motion, doubt infection, no trauma.  Past medical/surgical history that increases complexity of ED encounter: Diabetes  Interpretation of Diagnostics I personally reviewed the Laboratory Testing and my interpretation is as follows: No significant blood count or electrolyte disturbance.    Patient Reassessment and Ultimate Disposition/Management     No evidence of DKA, normal vitals, patient still concerned about her leg pain.  Favored MSK however will order outpatient ultrasound to ensure no DVT.  Appropriate for discharge.  Patient management required discussion with the following services or consulting groups:  None  Complexity of Problems Addressed Acute complicated illness or Injury  Additional Data Reviewed and Analyzed Further history  obtained from: Prior ED visit notes and Recent discharge summary  Additional Factors Impacting ED Encounter Risk None  Sharper HERO. Theadore, MD [facility] Emergency Medicine [facility] [email or web address]  Final Clinical Impressions(s) / ED Diagnoses     ICD-10-CM   1. Hyperglycemia  R73.9     2. Pain in both lower extremities  M79.604    M79.605       ED Discharge Orders          Ordered    LE VENOUS  12/20/23 0240             Discharge Instructions Discussed with and Provided to Patient:     Discharge Instructions      You were evaluated in the Emergency Department and after careful evaluation, we did not find any emergent condition requiring admission or further testing in the hospital.  Your exam/testing today is overall reassuring.  Follow-up with your primary care doctor to discuss your blood sugar management.  Recommend returning to [facility] later in the morning for ultrasound as we discussed.  Please return to the Emergency Department if you experience any worsening of your condition.   Thank you for allowing us  to be a part of your care.       Theadore Ozell HERO, MD 12/20/23 [phone]

## 2023-12-22 ENCOUNTER — Other Ambulatory Visit: Payer: Self-pay | Admitting: Physician Assistant

## 2023-12-22 DIAGNOSIS — Z1231 Encounter for screening mammogram for malignant neoplasm of breast: Secondary | ICD-10-CM

## 2024-01-09 ENCOUNTER — Ambulatory Visit (INDEPENDENT_AMBULATORY_CARE_PROVIDER_SITE_OTHER): Payer: MEDICAID

## 2024-01-09 ENCOUNTER — Encounter (HOSPITAL_BASED_OUTPATIENT_CLINIC_OR_DEPARTMENT_OTHER): Payer: Self-pay

## 2024-01-09 VITALS — BP 127/78 | HR 75 | Ht 64.0 in | Wt 277.0 lb

## 2024-01-09 DIAGNOSIS — G4710 Hypersomnia, unspecified: Secondary | ICD-10-CM

## 2024-01-09 DIAGNOSIS — G4719 Other hypersomnia: Secondary | ICD-10-CM

## 2024-01-09 DIAGNOSIS — F17210 Nicotine dependence, cigarettes, uncomplicated: Secondary | ICD-10-CM

## 2024-01-09 DIAGNOSIS — F17200 Nicotine dependence, unspecified, uncomplicated: Secondary | ICD-10-CM

## 2024-01-09 MED ORDER — NICOTINE POLACRILEX 4 MG MT GUM: 4.0000 mg | CHEWING_GUM | OROMUCOSAL | 0 refills | Status: AC | PRN

## 2024-01-09 NOTE — Progress Notes (Signed)
 Epworth Sleepiness Scale  Use the following scale to choose the most appropriate number for each situation. 0 Would never nod off 1  Slight  chance of nodding off 2 Moderate chance of nodding off 3 High chance of nodding off  Sitting and reading: 0 Watching TV: 0 Sitting, inactive0, in a public place (e.g., in a meeting, theater, or dinner event): 0 As a passenger in a car for an hour or more without stopping for a break: 0 Lying down to rest when circumstances permit:3 Sitting and talking to someone: 0 Sitting quietly after a meal without alcohol: 0 In a car, while stopped for a few  minutes in traffic or at a light: 0  TOTOAL: 3

## 2024-01-09 NOTE — Progress Notes (Signed)
 @Patient  ID: [name], female    DOB: [birth year], 53 y.o.   MRN: [id number]  Chief Complaint  Patient presents with   Establish Care    New sleep     Referring provider: Rosalea Rosina SAILOR, PA  HPI: [name] is a 53 year old female current smoker with past medical history of hypertension, diabetes, hypercholesterolemia, morbid obesity who presents today as a new patient evaluation for evaluation of sleep.  Patient reports that she has been told by multiple people that she snores at night.  She wakes up every day feeling very tired and as if she could go back to sleep.  She does not take frequent naps if she has the option.  She denies headaches and denies waking up short of breath.  She reports that she goes to bed between 11 PM and 12 AM every night and wakes up at 5:30 in the morning.  She reports this is her daily routine and she is unable to sleep on her back due to issues with snoring and shortness of breath.  She reports that her weight has increased over the last couple of years but is unsure how much.  She has never been worked up for sleep apnea in the past.  She denies chest pain, cough, fever, chills, night sweats, headache, dizziness or other complaints.  She does admit to some dyspnea on exertion at times, but does not feel as though this limits her activity.  TEST/EVENTS : None new  Allergies  Allergen Reactions   Latuda  [Lurasidone  Hcl] Nausea And Vomiting and Other (See Comments)    Shaking, also   Abilify  [Aripiprazole ] Other (See Comments)    Shaking   Flagyl  [Metronidazole  Hcl] Nausea And Vomiting   Metformin  And Related Diarrhea   Prolixin  [Fluphenazine ] Other (See Comments)    Shaking    Risperidone  And Paliperidone Other (See Comments)    Shaking    Seroquel  [Quetiapine  Fumarate] Other (See Comments)    Patient states she had a syncopal episode after taking medication.    Sulfa Antibiotics Nausea And Vomiting    Immunization History   Administered Date(s) Administered   Influenza,inj,Quad PF,6+ Mos 01/29/2017   PFIZER Comirnaty(Gray Top)Covid-19 Tri-Sucrose Vaccine 09/24/2020, 10/15/2020    Past Medical History:  Diagnosis Date   Arthritis    Asthma    Bipolar 1 disorder (HCC)    Bronchitis    Depression    Diabetes mellitus without complication (HCC)    Hemorrhoid    Schizoaffective disorder (HCC)    Schizophrenia (HCC)    Pt denies and reports it is schizoaffective disorder.    Transfusion history     Tobacco History: Social History   Tobacco Use  Smoking Status Every Day   Current packs/day: 0.50   Average packs/day: 0.5 packs/day for 17.0 years (8.5 ttl pk-yrs)   Types: Cigarettes  Smokeless Tobacco Never   Ready to quit: Not Answered Counseling given: Not Answered   Outpatient Medications Prior to Visit  Medication Sig Dispense Refill   albuterol  (VENTOLIN  HFA) 108 (90 Base) MCG/ACT inhaler Inhale 2 puffs into the lungs every 6 (six) hours as needed for wheezing or shortness of breath. 6.7 g 2   amLODipine  (NORVASC ) 5 MG tablet Take 5 mg by mouth daily.     desvenlafaxine (PRISTIQ) 25 MG 24 hr tablet Take 25 mg by mouth daily.     glipiZIDE  (GLUCOTROL ) 5 MG tablet Take 5 mg by mouth daily before breakfast.  OLANZapine  (ZYPREXA ) 10 MG tablet Take 10 mg by mouth in the morning and at bedtime.     rosuvastatin  (CRESTOR ) 20 MG tablet Take 20 mg by mouth daily.     primidone  (MYSOLINE ) 50 MG tablet Take 2 tablets (100 mg total) by mouth 3 (three) times daily. (Patient not taking: Reported on 01/09/2024) 180 tablet 0   No facility-administered medications prior to visit.     Review of Systems: As per HPI  Constitutional:   No  weight loss, night sweats,  Fevers, chills, fatigue, or  lassitude.  HEENT:   No headaches,  Difficulty swallowing,  Tooth/dental problems, or  Sore throat,                No sneezing, itching, ear ache, nasal congestion, post nasal drip,   CV:  No chest pain,   Orthopnea, PND, swelling in lower extremities, anasarca, dizziness, palpitations, syncope.   GI  No heartburn, indigestion, abdominal pain, nausea, vomiting, diarrhea, change in bowel habits, loss of appetite, bloody stools.   Resp: No shortness of breath with exertion or at rest.  No excess mucus, no productive cough,  No non-productive cough,  No coughing up of blood.  No change in color of mucus.  No wheezing.  No chest wall deformity  Skin: no rash or lesions.  GU: no dysuria, change in color of urine, no urgency or frequency.  No flank pain, no hematuria   MS:  No joint pain or swelling.  No decreased range of motion.  No back pain.    Physical Exam  BP 127/78   Pulse 75   Ht 5' 4 (1.626 m)   Wt 277 lb (125.6 kg)   SpO2 97%   BMI 47.55 kg/m   GEN: A/Ox3; pleasant , NAD, well nourished    HEENT:  [place]/AT,  EACs-clear, TMs-wnl, NOSE-clear, THROAT-clear, no lesions, no postnasal drip or exudate noted.  Mallampati 3  NECK:  Supple w/ fair ROM; no JVD; normal carotid impulses w/o bruits; no thyromegaly or nodules palpated; no lymphadenopathy.    RESP  Clear  P & A; w/o, wheezes/ rales/ or rhonchi. no accessory muscle use, no dullness to percussion  CARD:  RRR, no m/r/g, no peripheral edema, pulses intact, no cyanosis or clubbing.  GI:   Obese, soft & nt; nml bowel sounds; no organomegaly or masses detected.   Musco: Warm bil, no deformities or joint swelling noted.   Neuro: alert, no focal deficits noted.    Skin: Warm, no lesions or rashes    Lab Results:  CBC    Component Value Date/Time   WBC 10.3 12/20/2023 0110   RBC 4.08 12/20/2023 0110   HGB 10.9 (L) 12/20/2023 0110   HCT 34.2 (L) 12/20/2023 0110   PLT 319 12/20/2023 0110   MCV 83.8 12/20/2023 0110   MCH 26.7 12/20/2023 0110   MCHC 31.9 12/20/2023 0110   RDW 14.7 12/20/2023 0110   LYMPHSABS 4.3 (H) 12/20/2023 0110   MONOABS 0.4 12/20/2023 0110   EOSABS 0.2 12/20/2023 0110   BASOSABS 0.2 (H)  12/20/2023 0110    BMET    Component Value Date/Time   NA 131 (L) 12/20/2023 0110   K 3.8 12/20/2023 0110   CL 97 (L) 12/20/2023 0110   CO2 22 12/20/2023 0110   GLUCOSE 349 (H) 12/20/2023 0110   BUN 5 (L) 12/20/2023 0110   CREATININE 0.87 12/20/2023 0110   CALCIUM  9.1 12/20/2023 0110   GFRNONAA >60 12/20/2023 0110  GFRAA >60 11/27/2019 2245    BNP    Component Value Date/Time   BNP 16.4 07/13/2022 0000    ProBNP No results found for: PROBNP  Imaging: LE VENOUS Result Date: 12/20/2023  Lower Venous DVT Study Patient Name:  [name]  Date of Exam:   12/20/2023 Medical Rec #: [id number]           Accession #:    [id number] Date of Birth: 03/31/1971            Patient Gender: F Patient Age:   89 years Exam Location:  [address] Procedure:      VAS US  LOWER EXTREMITY VENOUS (DVT) Referring Phys: OZELL POISSON --------------------------------------------------------------------------------  Indications: Pain.  Comparison Study: N/A Performing Technologist: Dena Pane  Examination Guidelines: A complete evaluation includes B-mode imaging, spectral Doppler, color Doppler, and power Doppler as needed of all accessible portions of each vessel. Bilateral testing is considered an integral part of a complete examination. Limited examinations for reoccurring indications may be performed as noted. The reflux portion of the exam is performed with the patient in reverse Trendelenburg.  +---------+---------------+---------+-----------+----------+--------------+ RIGHT    CompressibilityPhasicitySpontaneityPropertiesThrombus Aging +---------+---------------+---------+-----------+----------+--------------+ CFV      Full           Yes      Yes                                 +---------+---------------+---------+-----------+----------+--------------+ SFJ      Full                                                         +---------+---------------+---------+-----------+----------+--------------+ FV Prox  Full           Yes      Yes                                 +---------+---------------+---------+-----------+----------+--------------+ FV Mid   Full                                                        +---------+---------------+---------+-----------+----------+--------------+ FV DistalFull                                                        +---------+---------------+---------+-----------+----------+--------------+ PFV      Full                                                        +---------+---------------+---------+-----------+----------+--------------+ POP      Full           Yes      Yes                                 +---------+---------------+---------+-----------+----------+--------------+  PTV      Full                                                        +---------+---------------+---------+-----------+----------+--------------+ PERO     Full                                                        +---------+---------------+---------+-----------+----------+--------------+ GSV      Full                                                        +---------+---------------+---------+-----------+----------+--------------+  +---------+---------------+---------+-----------+----------+--------------+ LEFT     CompressibilityPhasicitySpontaneityPropertiesThrombus Aging +---------+---------------+---------+-----------+----------+--------------+ CFV      Full           Yes      Yes                                 +---------+---------------+---------+-----------+----------+--------------+ SFJ      Full                                                        +---------+---------------+---------+-----------+----------+--------------+ FV Prox  Full           Yes      Yes                                  +---------+---------------+---------+-----------+----------+--------------+ FV Mid   Full                                                        +---------+---------------+---------+-----------+----------+--------------+ FV DistalFull                                                        +---------+---------------+---------+-----------+----------+--------------+ PFV      Full                                                        +---------+---------------+---------+-----------+----------+--------------+ POP      Full           Yes      Yes                                 +---------+---------------+---------+-----------+----------+--------------+  PTV      Full                                                        +---------+---------------+---------+-----------+----------+--------------+ PERO     Full                                                        +---------+---------------+---------+-----------+----------+--------------+ GSV      Full                                                        +---------+---------------+---------+-----------+----------+--------------+  Summary: BILATERAL: - No evidence of deep vein thrombosis seen in the lower extremities, bilaterally. - No evidence of superficial venous thrombosis in the lower extremities, bilaterally. -No evidence of popliteal cyst, bilaterally.   *See table(s) above for measurements and observations. Electronically signed by Lonni Gaskins MD on 12/20/2023 at 1:45:46 PM.    Final     Administration History     None           No data to display          No results found for: NITRICOXIDE   Assessment & Plan:   Assessment & Plan Tobacco use disorder - Smoking cessation counseling reviewed in office today. -Patient agreeable to a trial of nicotine  gum; Rx sent to pharmacy of choice today. -Plan for pulmonary function testing to evaluate dyspnea on exertion. Excessive daytime  sleepiness  Complete home sleep test as ordered.  Follow up in clinic in about 6-8 weeks to discuss results.  Continue Albuterol  as needed for shortness of breath.  Work on smoking cessation; Nicotine  gum Rx sent in today.  Follow sleep hygiene as discussed.  Return in about 7 weeks (around 02/27/2024) for sleep study review, PFT.  Candis Dandy, PA-C 01/09/2024

## 2024-01-09 NOTE — Patient Instructions (Addendum)
 Complete home sleep test as ordered.  Complete pulmonary function testing as ordered.  Follow up in clinic after both completed; about 6-8 weeks to discuss results.  Continue Albuterol  as needed for shortness of breath.  Work on smoking cessation; Nicotine  gum Rx sent in today.  Follow sleep hygiene as discussed.

## 2024-01-09 NOTE — Assessment & Plan Note (Signed)
-   Smoking cessation counseling reviewed in office today. -Patient agreeable to a trial of nicotine  gum; Rx sent to pharmacy of choice today. -Plan for pulmonary function testing to evaluate dyspnea on exertion.

## 2024-01-30 ENCOUNTER — Encounter (HOSPITAL_BASED_OUTPATIENT_CLINIC_OR_DEPARTMENT_OTHER): Payer: MEDICAID

## 2024-02-07 ENCOUNTER — Encounter (HOSPITAL_BASED_OUTPATIENT_CLINIC_OR_DEPARTMENT_OTHER): Payer: MEDICAID

## 2024-02-27 ENCOUNTER — Emergency Department (HOSPITAL_COMMUNITY)
Admission: EM | Admit: 2024-02-27 | Discharge: 2024-02-27 | Disposition: A | Discharge status: Home / Self Care | Payer: MEDICAID | Attending: Emergency Medicine | Admitting: Emergency Medicine

## 2024-02-27 ENCOUNTER — Encounter (HOSPITAL_COMMUNITY): Payer: Self-pay

## 2024-02-27 ENCOUNTER — Other Ambulatory Visit: Payer: Self-pay

## 2024-02-27 ENCOUNTER — Ambulatory Visit (HOSPITAL_BASED_OUTPATIENT_CLINIC_OR_DEPARTMENT_OTHER): Payer: MEDICAID

## 2024-02-27 ENCOUNTER — Encounter (HOSPITAL_BASED_OUTPATIENT_CLINIC_OR_DEPARTMENT_OTHER): Payer: MEDICAID

## 2024-02-27 DIAGNOSIS — Z7984 Long term (current) use of oral hypoglycemic drugs: Secondary | ICD-10-CM | POA: Diagnosis not present

## 2024-02-27 DIAGNOSIS — J45909 Unspecified asthma, uncomplicated: Secondary | ICD-10-CM | POA: Insufficient documentation

## 2024-02-27 DIAGNOSIS — Z794 Long term (current) use of insulin: Secondary | ICD-10-CM | POA: Insufficient documentation

## 2024-02-27 DIAGNOSIS — R739 Hyperglycemia, unspecified: Secondary | ICD-10-CM

## 2024-02-27 DIAGNOSIS — D72829 Elevated white blood cell count, unspecified: Secondary | ICD-10-CM | POA: Diagnosis not present

## 2024-02-27 DIAGNOSIS — D649 Anemia, unspecified: Secondary | ICD-10-CM | POA: Insufficient documentation

## 2024-02-27 DIAGNOSIS — E1165 Type 2 diabetes mellitus with hyperglycemia: Secondary | ICD-10-CM | POA: Diagnosis not present

## 2024-02-27 DIAGNOSIS — R109 Unspecified abdominal pain: Secondary | ICD-10-CM | POA: Diagnosis present

## 2024-02-27 DIAGNOSIS — N3090 Cystitis, unspecified without hematuria: Secondary | ICD-10-CM | POA: Diagnosis not present

## 2024-02-27 LAB — BASIC METABOLIC PANEL WITH GFR
Anion gap: 16 — ABNORMAL HIGH (ref 5–15)
BUN: 11 mg/dL (ref 6–20)
CO2: 20 mmol/L — ABNORMAL LOW (ref 22–32)
Calcium: 9.7 mg/dL (ref 8.9–10.3)
Chloride: 98 mmol/L (ref 98–111)
Creatinine, Ser: 0.85 mg/dL (ref 0.44–1.00)
GFR, Estimated: >60 mL/min (ref >60)
Glucose, Bld: 348 mg/dL — ABNORMAL HIGH (ref 70–99)
Potassium: 3.7 mmol/L (ref 3.5–5.1)
Sodium: 134 mmol/L — ABNORMAL LOW (ref 135–145)

## 2024-02-27 LAB — URINALYSIS, ROUTINE W REFLEX MICROSCOPIC
Bilirubin Urine: NEGATIVE
Glucose, UA: >=500 mg/dL — AB
Ketones, ur: 20 mg/dL — AB
Nitrite: NEGATIVE
Protein, ur: 30 mg/dL — AB
Specific Gravity, Urine: 1.022 (ref 1.005–1.030)
pH: 5 (ref 5.0–8.0)

## 2024-02-27 LAB — CBC WITH DIFFERENTIAL/PLATELET
Abs Immature Granulocytes: 0.03 K/uL (ref 0.00–0.07)
Basophils Absolute: 0.1 K/uL (ref 0.0–0.1)
Basophils Relative: 0 %
Eosinophils Absolute: 0 K/uL (ref 0.0–0.5)
Eosinophils Relative: 0 %
HCT: 35.4 % — ABNORMAL LOW (ref 36.0–46.0)
Hemoglobin: 11.2 g/dL — ABNORMAL LOW (ref 12.0–15.0)
Immature Granulocytes: 0 %
Lymphocytes Relative: 33 %
Lymphs Abs: 4.1 K/uL — ABNORMAL HIGH (ref 0.7–4.0)
MCH: 26.3 pg (ref 26.0–34.0)
MCHC: 31.6 g/dL (ref 30.0–36.0)
MCV: 83.1 fL (ref 80.0–100.0)
Monocytes Absolute: 0.7 K/uL (ref 0.1–1.0)
Monocytes Relative: 5 %
Neutro Abs: 7.5 K/uL (ref 1.7–7.7)
Neutrophils Relative %: 62 %
Platelets: 318 K/uL (ref 150–400)
RBC: 4.26 MIL/uL (ref 3.87–5.11)
RDW: 15.5 % (ref 11.5–15.5)
WBC: 12.3 K/uL — ABNORMAL HIGH (ref 4.0–10.5)
nRBC: 0 % (ref 0.0–0.2)

## 2024-02-27 LAB — CBG MONITORING, ED
Glucose-Capillary: 277 mg/dL — ABNORMAL HIGH (ref 70–99)
Glucose-Capillary: 379 mg/dL — ABNORMAL HIGH (ref 70–99)

## 2024-02-27 MED ORDER — INSULIN ASPART 100 UNIT/ML IJ SOLN
2.0000 [IU] | INTRAMUSCULAR | Status: AC
  Administered 2024-02-27: 2 [IU] via SUBCUTANEOUS
  Filled 2024-02-27: qty 2

## 2024-02-27 MED ORDER — FOSFOMYCIN TROMETHAMINE 3 G PO PACK: 3.0000 g | PACK | Freq: Once | ORAL | 0 refills | Status: AC

## 2024-02-27 MED ORDER — SODIUM CHLORIDE 0.9 % IV BOLUS
500.0000 mL | Freq: Once | INTRAVENOUS | Status: AC
  Administered 2024-02-27: 500 mL via INTRAVENOUS

## 2024-02-27 NOTE — ED Triage Notes (Signed)
 Pt sts concern for elevated blood sugar since yesterday. Reports high CBG on her at home glucometer. Pt reports lower abdominal pain that started this morning. Associated with urinary frequency. No NVD.

## 2024-02-27 NOTE — ED Notes (Signed)
 Pt axox4. GCS 15. PT verbalizes understanding of discharge instructions, follow up, new Rx and diabetic diet instructions. Pt ambulated out of ER with steady gait to transportation home by self

## 2024-02-27 NOTE — ED Provider Notes (Signed)
  EMERGENCY DEPARTMENT AT [facility] Provider Note   CSN: [id number] Arrival date & time: 02/27/24  9946     Patient presents with: Abdominal Pain and Hyperglycemia   [name] is a 53 y.o. female.   The history is provided by the patient.  Hyperglycemia Blood sugar level PTA:  High Severity:  Severe Onset quality:  Gradual Duration:  1 day Timing:  Constant Progression:  Unchanged Diabetes status:  Controlled with insulin  and controlled with oral medications Time since last antidiabetic medication: hours. Context: recent change in diet   Context comment:  Ate grits and drank a 2 liter bottle of gingerale Relieved by:  Nothing Ineffective treatments:  None tried Associated symptoms: polyuria   Associated symptoms: no abdominal pain, no chest pain, no confusion, no dehydration, no fever, no nausea, no syncope, no vomiting, no weakness and no weight change   Associated symptoms comment:  Urinary frequency  Patient with dm AND schizophrenia presents with frequency and hyperglycemia post large carbohydrate intake with elevated glucose at home.      Past Medical History:  Diagnosis Date   Arthritis    Asthma    Bipolar 1 disorder (HCC)    Bronchitis    Depression    Diabetes mellitus without complication (HCC)    Hemorrhoid    Schizoaffective disorder (HCC)    Schizophrenia (HCC)    Pt denies and reports it is schizoaffective disorder.    Transfusion history      Prior to Admission medications   Medication Sig Start Date End Date Taking? Authorizing Provider  albuterol  (VENTOLIN  HFA) 108 (90 Base) MCG/ACT inhaler Inhale 2 puffs into the lungs every 6 (six) hours as needed for wheezing or shortness of breath. 07/14/22   Rai, Ripudeep K, MD  amLODipine  (NORVASC ) 5 MG tablet Take 5 mg by mouth daily.    [provider]  desvenlafaxine (PRISTIQ) 25 MG 24 hr tablet Take 25 mg by mouth daily.    [provider]  glipiZIDE   (GLUCOTROL ) 5 MG tablet Take 5 mg by mouth daily before breakfast.    [provider]  nicotine  polacrilex ([employer] NICOTINE ) 4 MG gum Take 1 each (4 mg total) by mouth as needed for smoking cessation. 01/09/24   Charley Conger, PA-C  OLANZapine  (ZYPREXA ) 10 MG tablet Take 10 mg by mouth in the morning and at bedtime.    [provider]  rosuvastatin  (CRESTOR ) 20 MG tablet Take 20 mg by mouth daily.    [provider]  iron  polysaccharides (NIFEREX) 150 MG capsule Take 150 mg by mouth 2 (two) times daily.  10/01/12  [provider]    Allergies: Latuda  [lurasidone  hcl], Abilify  [aripiprazole ], Flagyl  [metronidazole  hcl], Metformin  and related, Prolixin  [fluphenazine ], Risperidone  and paliperidone, Seroquel  [quetiapine  fumarate], and Sulfa antibiotics    Review of Systems  Constitutional:  Negative for fever.  Cardiovascular:  Negative for chest pain and syncope.  Gastrointestinal:  Negative for abdominal pain, anal bleeding, blood in stool, nausea and vomiting.  Endocrine: Positive for polyuria.  Genitourinary:  Positive for frequency.  Neurological:  Negative for weakness.  Psychiatric/Behavioral:  Negative for confusion.   All other systems reviewed and are negative.   Updated Vital Signs BP 137/73   Pulse 72   Temp 98.7 F (37.1 C)   Resp 18   Ht 5' 4 (1.626 m)   Wt 122.5 kg   SpO2 97%   BMI 46.35 kg/m   Physical Exam Vitals and nursing  note reviewed.  Constitutional:      General: She is not in acute distress.    Appearance: Normal appearance. She is well-developed.  HENT:     Head: Normocephalic and atraumatic.     Nose: Nose normal.  Eyes:     Pupils: Pupils are equal, round, and reactive to light.  Cardiovascular:     Rate and Rhythm: Normal rate and regular rhythm.     Pulses: Normal pulses.     Heart sounds: Normal heart sounds.  Pulmonary:     Effort: Pulmonary effort is normal. No respiratory distress.     Breath sounds: Normal  breath sounds.  Abdominal:     General: Bowel sounds are normal. There is no distension.     Palpations: Abdomen is soft. There is no mass.     Tenderness: There is no abdominal tenderness. There is no guarding or rebound.     Hernia: No hernia is present.  Musculoskeletal:        General: Normal range of motion.     Cervical back: Normal range of motion and neck supple.  Skin:    General: Skin is dry.     Capillary Refill: Capillary refill takes less than 2 seconds.     Findings: No erythema or rash.  Neurological:     General: No focal deficit present.     Mental Status: She is alert.     Deep Tendon Reflexes: Reflexes normal.  Psychiatric:        Mood and Affect: Mood normal.     (all labs ordered are listed, but only abnormal results are displayed) Results for orders placed or performed during the hospital encounter of 02/27/24  CBG monitoring, ED   Collection Time: 02/27/24 12:57 AM  Result Value Ref Range   Glucose-Capillary 379 (H) 70 - 99 mg/dL  CBC with Differential   Collection Time: 02/27/24  3:00 AM  Result Value Ref Range   WBC 12.3 (H) 4.0 - 10.5 K/uL   RBC 4.26 3.87 - 5.11 MIL/uL   Hemoglobin 11.2 (L) 12.0 - 15.0 g/dL   HCT 64.5 (L) 63.9 - 53.9 %   MCV 83.1 80.0 - 100.0 fL   MCH 26.3 26.0 - 34.0 pg   MCHC 31.6 30.0 - 36.0 g/dL   RDW 84.4 88.4 - 84.4 %   Platelets 318 150 - 400 K/uL   nRBC 0.0 0.0 - 0.2 %   Neutrophils Relative % 62 %   Neutro Abs 7.5 1.7 - 7.7 K/uL   Lymphocytes Relative 33 %   Lymphs Abs 4.1 (H) 0.7 - 4.0 K/uL   Monocytes Relative 5 %   Monocytes Absolute 0.7 0.1 - 1.0 K/uL   Eosinophils Relative 0 %   Eosinophils Absolute 0.0 0.0 - 0.5 K/uL   Basophils Relative 0 %   Basophils Absolute 0.1 0.0 - 0.1 K/uL   Immature Granulocytes 0 %   Abs Immature Granulocytes 0.03 0.00 - 0.07 K/uL  Basic metabolic panel   Collection Time: 02/27/24  3:00 AM  Result Value Ref Range   Sodium 134 (L) 135 - 145 mmol/L   Potassium 3.7 3.5 - 5.1  mmol/L   Chloride 98 98 - 111 mmol/L   CO2 20 (L) 22 - 32 mmol/L   Glucose, Bld 348 (H) 70 - 99 mg/dL   BUN 11 6 - 20 mg/dL   Creatinine, Ser 9.14 0.44 - 1.00 mg/dL   Calcium  9.7 8.9 - 10.3 mg/dL   GFR, Estimated >  60 >60 mL/min   Anion gap 16 (H) 5 - 15  Urinalysis, Routine w reflex microscopic -Urine, Clean Catch   Collection Time: 02/27/24  3:07 AM  Result Value Ref Range   Color, Urine YELLOW YELLOW   APPearance HAZY (A) CLEAR   Specific Gravity, Urine 1.022 1.005 - 1.030   pH 5.0 5.0 - 8.0   Glucose, UA >=500 (A) NEGATIVE mg/dL   Hgb urine dipstick SMALL (A) NEGATIVE   Bilirubin Urine NEGATIVE NEGATIVE   Ketones, ur 20 (A) NEGATIVE mg/dL   Protein, ur 30 (A) NEGATIVE mg/dL   Nitrite NEGATIVE NEGATIVE   Leukocytes,Ua SMALL (A) NEGATIVE   RBC / HPF 6-10 0 - 5 RBC/hpf   WBC, UA 11-20 0 - 5 WBC/hpf   Bacteria, UA RARE (A) NONE SEEN   Squamous Epithelial / HPF 11-20 0 - 5 /HPF   Mucus PRESENT   CBG monitoring, ED   Collection Time: 02/27/24  4:13 AM  Result Value Ref Range   Glucose-Capillary 277 (H) 70 - 99 mg/dL   Comment 1 Notify RN    No results found.  None  Radiology: No results found.   Procedures   Medications Ordered in the ED  insulin  aspart (novoLOG ) injection 2 Units (has no administration in time range)  sodium chloride  0.9 % bolus 500 mL (0 mLs Intravenous Stopped 02/27/24 0445)                                    Medical Decision Making Patient with frequency and polyuria post large carb ingestion  Amount and/or Complexity of Data Reviewed External Data Reviewed: notes.    Details: Previous reviewed  Labs: ordered.    Details: Patient with elevated glucose 379 improved 277 post IVF.  White count slight elevation 12.3, slight low hemoglobin 11.2, normal platelets.  Sodium is 134, normal potassium 3.7 urine with small leuk esterase given symptoms will treat for cystitis   Risk Prescription drug management. Risk Details: Exam is benign and  reassuring.  Symptoms resulted from grits and a 2 liter bottle of full sugar soda.  Hydrated to normalize sugar.  I do not believe this is DKA.  Insulin  also provided as well as diabetic diet instruction.  I have advised close follow up with PMD.  Stable for discharge.  Strict returns.       Final diagnoses:  Hyperglycemia   No signs of systemic illness or infection. The patient is nontoxic-appearing on exam and vital signs are within normal limits.  I have reviewed the triage vital signs and the nursing notes. Pertinent labs & imaging results that were available during my care of the patient were reviewed by me and considered in my medical decision making (see chart for details). After history, exam, and medical workup I feel the patient has been appropriately medically screened and is safe for discharge home. Pertinent diagnoses were discussed with the patient. Patient was given return precautions.      ED Discharge Orders     None          Tanya Marvin, MD 02/27/24 9489

## 2024-02-27 NOTE — Progress Notes (Deleted)
 @Patient  ID: [name], female    DOB: 11/01/1970, 53 y.o.   MRN: [id number]  No chief complaint on file.   Referring provider: Catalina Bare, MD  HPI: Discussed the use of AI scribe software for clinical note transcription with the patient, who gave verbal consent to proceed.  History of Present Illness  Last OV 01/09/2024: [name] is a 53 year old female current smoker with past medical history of hypertension, diabetes, hypercholesterolemia, morbid obesity who presents today as a new patient evaluation for evaluation of sleep.  Patient reports that she has been told by multiple people that she snores at night.  She wakes up every day feeling very tired and as if she could go back to sleep.  She does not take frequent naps if she has the option.  She denies headaches and denies waking up short of breath.  She reports that she goes to bed between 11 PM and 12 AM every night and wakes up at 5:30 in the morning.  She reports this is her daily routine and she is unable to sleep on her back due to issues with snoring and shortness of breath.  She reports that her weight has increased over the last couple of years but is unsure how much.  She has never been worked up for sleep apnea in the past.  She denies chest pain, cough, fever, chills, night sweats, headache, dizziness or other complaints.  She does admit to some dyspnea on exertion at times, but does not feel as though this limits her activity.     TEST/EVENTS :   Allergies  Allergen Reactions   Latuda  [Lurasidone  Hcl] Nausea And Vomiting and Other (See Comments)    Shaking, also   Abilify  [Aripiprazole ] Other (See Comments)    Shaking   Flagyl  [Metronidazole  Hcl] Nausea And Vomiting   Metformin  And Related Diarrhea   Prolixin  [Fluphenazine ] Other (See Comments)    Shaking    Risperidone  And Paliperidone Other (See Comments)    Shaking    Seroquel  [Quetiapine  Fumarate] Other (See Comments)    Patient states  she had a syncopal episode after taking medication.    Sulfa Antibiotics Nausea And Vomiting    Immunization History  Administered Date(s) Administered   Influenza,inj,Quad PF,6+ Mos 01/29/2017   PFIZER Comirnaty(Gray Top)Covid-19 Tri-Sucrose Vaccine 09/24/2020, 10/15/2020    Past Medical History:  Diagnosis Date   Arthritis    Asthma    Bipolar 1 disorder (HCC)    Bronchitis    Depression    Diabetes mellitus without complication (HCC)    Hemorrhoid    Schizoaffective disorder (HCC)    Schizophrenia (HCC)    Pt denies and reports it is schizoaffective disorder.    Transfusion history     Tobacco History: Social History   Tobacco Use  Smoking Status Every Day   Current packs/day: 0.50   Average packs/day: 0.5 packs/day for 17.0 years (8.5 ttl pk-yrs)   Types: Cigarettes  Smokeless Tobacco Never   Ready to quit: Not Answered Counseling given: Not Answered   Outpatient Medications Prior to Visit  Medication Sig Dispense Refill   albuterol  (VENTOLIN  HFA) 108 (90 Base) MCG/ACT inhaler Inhale 2 puffs into the lungs every 6 (six) hours as needed for wheezing or shortness of breath. 6.7 g 2   amLODipine  (NORVASC ) 5 MG tablet Take 5 mg by mouth daily.     desvenlafaxine (PRISTIQ) 25 MG 24 hr tablet Take 25 mg by mouth daily.  fosfomycin (MONUROL) 3 g PACK Take 3 g by mouth once for 1 dose. 3 g 0   glipiZIDE  (GLUCOTROL ) 5 MG tablet Take 5 mg by mouth daily before breakfast.     nicotine  polacrilex ([employer] NICOTINE ) 4 MG gum Take 1 each (4 mg total) by mouth as needed for smoking cessation. 100 tablet 0   OLANZapine  (ZYPREXA ) 10 MG tablet Take 10 mg by mouth in the morning and at bedtime.     rosuvastatin  (CRESTOR ) 20 MG tablet Take 20 mg by mouth daily.     No facility-administered medications prior to visit.     Review of Systems:   Constitutional:   No  weight loss, night sweats,  Fevers, chills, fatigue, or  lassitude.  HEENT:   No headaches,  Difficulty  swallowing,  Tooth/dental problems, or  Sore throat,                No sneezing, itching, ear ache, nasal congestion, post nasal drip,   CV:  No chest pain,  Orthopnea, PND, swelling in lower extremities, anasarca, dizziness, palpitations, syncope.   GI  No heartburn, indigestion, abdominal pain, nausea, vomiting, diarrhea, change in bowel habits, loss of appetite, bloody stools.   Resp: No shortness of breath with exertion or at rest.  No excess mucus, no productive cough,  No non-productive cough,  No coughing up of blood.  No change in color of mucus.  No wheezing.  No chest wall deformity  Skin: no rash or lesions.  GU: no dysuria, change in color of urine, no urgency or frequency.  No flank pain, no hematuria   MS:  No joint pain or swelling.  No decreased range of motion.  No back pain.    Physical Exam  There were no vitals taken for this visit.  GEN: A/Ox3; pleasant , NAD, well nourished    HEENT:  [place]/AT,  EACs-clear, TMs-wnl, NOSE-clear, THROAT-clear, no lesions, no postnasal drip or exudate noted.   NECK:  Supple w/ fair ROM; no JVD; normal carotid impulses w/o bruits; no thyromegaly or nodules palpated; no lymphadenopathy.    RESP  Clear  P & A; w/o, wheezes/ rales/ or rhonchi. no accessory muscle use, no dullness to percussion  CARD:  RRR, no m/r/g, no peripheral edema, pulses intact, no cyanosis or clubbing.  GI:   Soft & nt; nml bowel sounds; no organomegaly or masses detected.   Musco: Warm bil, no deformities or joint swelling noted.   Neuro: alert, no focal deficits noted.    Skin: Warm, no lesions or rashes    Lab Results:  CBC    Component Value Date/Time   WBC 12.3 (H) 02/27/2024 0300   RBC 4.26 02/27/2024 0300   HGB 11.2 (L) 02/27/2024 0300   HCT 35.4 (L) 02/27/2024 0300   PLT 318 02/27/2024 0300   MCV 83.1 02/27/2024 0300   MCH 26.3 02/27/2024 0300   MCHC 31.6 02/27/2024 0300   RDW 15.5 02/27/2024 0300   LYMPHSABS 4.1 (H) 02/27/2024 0300    MONOABS 0.7 02/27/2024 0300   EOSABS 0.0 02/27/2024 0300   BASOSABS 0.1 02/27/2024 0300    BMET    Component Value Date/Time   NA 134 (L) 02/27/2024 0300   K 3.7 02/27/2024 0300   CL 98 02/27/2024 0300   CO2 20 (L) 02/27/2024 0300   GLUCOSE 348 (H) 02/27/2024 0300   BUN 11 02/27/2024 0300   CREATININE 0.85 02/27/2024 0300   CALCIUM  9.7 02/27/2024 0300   GFRNONAA >  60 02/27/2024 0300   GFRAA >60 11/27/2019 2245    BNP    Component Value Date/Time   BNP 16.4 07/13/2022 0000    ProBNP No results found for: PROBNP  Imaging: No results found.  Administration History     None           No data to display          No results found for: NITRICOXIDE   Assessment & Plan:   Assessment & Plan    No follow-ups on file.  Candis Dandy, PA-C 02/27/2024

## 2024-05-16 ENCOUNTER — Emergency Department (HOSPITAL_COMMUNITY)
Admission: EM | Admit: 2024-05-16 | Discharge: 2024-05-16 | Disposition: A | Discharge status: Home / Self Care | Payer: MEDICAID | Attending: Emergency Medicine | Admitting: Emergency Medicine

## 2024-05-16 ENCOUNTER — Other Ambulatory Visit: Payer: Self-pay

## 2024-05-16 ENCOUNTER — Encounter (HOSPITAL_COMMUNITY): Payer: Self-pay

## 2024-05-16 DIAGNOSIS — N3090 Cystitis, unspecified without hematuria: Secondary | ICD-10-CM | POA: Diagnosis not present

## 2024-05-16 DIAGNOSIS — R300 Dysuria: Secondary | ICD-10-CM | POA: Diagnosis present

## 2024-05-16 DIAGNOSIS — E119 Type 2 diabetes mellitus without complications: Secondary | ICD-10-CM | POA: Insufficient documentation

## 2024-05-16 DIAGNOSIS — Z720 Tobacco use: Secondary | ICD-10-CM | POA: Diagnosis not present

## 2024-05-16 DIAGNOSIS — E876 Hypokalemia: Secondary | ICD-10-CM | POA: Diagnosis not present

## 2024-05-16 LAB — BASIC METABOLIC PANEL WITH GFR
Anion gap: 13 (ref 5–15)
BUN: 7 mg/dL (ref 6–20)
CO2: 24 mmol/L (ref 22–32)
Calcium: 9.4 mg/dL (ref 8.9–10.3)
Chloride: 99 mmol/L (ref 98–111)
Creatinine, Ser: 0.83 mg/dL (ref 0.44–1.00)
GFR, Estimated: >60 mL/min
Glucose, Bld: 291 mg/dL — ABNORMAL HIGH (ref 70–99)
Potassium: 3.2 mmol/L — ABNORMAL LOW (ref 3.5–5.1)
Sodium: 136 mmol/L (ref 135–145)

## 2024-05-16 LAB — WET PREP, GENITAL
Clue Cells Wet Prep HPF POC: NONE SEEN
Sperm: NONE SEEN
Trich, Wet Prep: NONE SEEN
WBC, Wet Prep HPF POC: <10
Yeast Wet Prep HPF POC: NONE SEEN

## 2024-05-16 LAB — URINALYSIS, W/ REFLEX TO CULTURE (INFECTION SUSPECTED)
Bilirubin Urine: NEGATIVE
Glucose, UA: >=500 mg/dL — AB
Hgb urine dipstick: NEGATIVE
Ketones, ur: NEGATIVE mg/dL
Nitrite: NEGATIVE
Protein, ur: NEGATIVE mg/dL
Specific Gravity, Urine: 1.018 (ref 1.005–1.030)
pH: 6 (ref 5.0–8.0)

## 2024-05-16 LAB — CBC
HCT: 33.2 % — ABNORMAL LOW (ref 36.0–46.0)
Hemoglobin: 10.7 g/dL — ABNORMAL LOW (ref 12.0–15.0)
MCH: 27.3 pg (ref 26.0–34.0)
MCHC: 32.2 g/dL (ref 30.0–36.0)
MCV: 84.7 fL (ref 80.0–100.0)
Platelets: 254 10*3/uL (ref 150–400)
RBC: 3.92 MIL/uL (ref 3.87–5.11)
RDW: 15.2 % (ref 11.5–15.5)
WBC: 11.3 10*3/uL — ABNORMAL HIGH (ref 4.0–10.5)
nRBC: 0 % (ref 0.0–0.2)

## 2024-05-16 LAB — CBG MONITORING, ED: Glucose-Capillary: 265 mg/dL — ABNORMAL HIGH (ref 70–99)

## 2024-05-16 MED ORDER — CEFADROXIL 500 MG PO CAPS: 500.0000 mg | ORAL_CAPSULE | Freq: Two times a day (BID) | ORAL | 0 refills | Status: AC

## 2024-05-16 MED ORDER — POTASSIUM CHLORIDE CRYS ER 20 MEQ PO TBCR: 40.0000 meq | EXTENDED_RELEASE_TABLET | Freq: Once | ORAL | Status: DC

## 2024-05-16 NOTE — Discharge Instructions (Signed)
 Please take the entire course of antibiotics that have been prescribed, follow-up with your primary care doctor if your symptoms do not improve despite treatment.

## 2024-05-16 NOTE — ED Triage Notes (Signed)
 Pt reports with dysuria and bladder pain for several weeks. Pt has been on multiple abts with no relief. Pt states that she has an odor on her bottom that she does not even know how to describe.

## 2024-05-16 NOTE — ED Provider Notes (Signed)
 " [place] EMERGENCY DEPARTMENT AT [facility] Provider Note   CSN: [id number] Arrival date & time: 05/16/24  0541     Patient presents with: Dysuria   [name] is a 54 y.o. female with past medical history significant for schizophrenia, bipolar disorder, obesity, diabetes, tobacco use who presents concern for dysuria, bladder pain for several weeks.  Patient reports that she was seen around 4 weeks ago, treated for cystitis.  She reports no significant improvement.  Patient also endorses some foul odor from her vagina.  She endorses some brownish vaginal discharge.  She reports that she has been sexually active, but denies any new partners.  She does not currently have a menstrual cycle.    Dysuria      Prior to Admission medications  Medication Sig Start Date End Date Taking? Authorizing Provider  cefadroxil  (DURICEF) 500 MG capsule Take 1 capsule (500 mg total) by mouth 2 (two) times daily. 05/16/24  Yes Lonzell Dorris H, PA-C  albuterol  (VENTOLIN  HFA) 108 (90 Base) MCG/ACT inhaler Inhale 2 puffs into the lungs every 6 (six) hours as needed for wheezing or shortness of breath. 07/14/22   Rai, Ripudeep K, MD  amLODipine  (NORVASC ) 5 MG tablet Take 5 mg by mouth daily.    [provider]  desvenlafaxine (PRISTIQ) 25 MG 24 hr tablet Take 25 mg by mouth daily.    [provider]  glipiZIDE  (GLUCOTROL ) 5 MG tablet Take 5 mg by mouth daily before breakfast.    [provider]  nicotine  polacrilex ([employer] NICOTINE ) 4 MG gum Take 1 each (4 mg total) by mouth as needed for smoking cessation. 01/09/24   Charley Conger, PA-C  OLANZapine  (ZYPREXA ) 10 MG tablet Take 10 mg by mouth in the morning and at bedtime.    [provider]  rosuvastatin  (CRESTOR ) 20 MG tablet Take 20 mg by mouth daily.    [provider]  iron  polysaccharides (NIFEREX) 150 MG capsule Take 150 mg by mouth 2 (two) times daily.  10/01/12  [provider]     Allergies: Latuda  [lurasidone  hcl], Abilify  [aripiprazole ], Flagyl  [metronidazole  hcl], Metformin  and related, Prolixin  [fluphenazine ], Risperidone  and paliperidone, Seroquel  [quetiapine  fumarate], and Sulfa antibiotics    Review of Systems  Genitourinary:  Positive for dysuria.  All other systems reviewed and are negative.   Updated Vital Signs BP (!) 128/90 (BP Location: Right Arm)   Pulse 83   Temp 97.6 F (36.4 C)   Resp 18   SpO2 92%   Physical Exam Vitals and nursing note reviewed.  Constitutional:      General: She is not in acute distress.    Appearance: Normal appearance.  HENT:     Head: Normocephalic and atraumatic.  Eyes:     General:        Right eye: No discharge.        Left eye: No discharge.  Cardiovascular:     Rate and Rhythm: Normal rate and regular rhythm.     Heart sounds: No murmur heard.    No friction rub. No gallop.  Pulmonary:     Effort: Pulmonary effort is normal.     Breath sounds: Normal breath sounds.  Abdominal:     General: Bowel sounds are normal.     Palpations: Abdomen is soft.     Comments: No focal tenderness to palpation of the lower abdomen  Genitourinary:    Comments: Overall normal appearance of vaginal vault with normal-appearing typical whitish discharge.  No yeast appearance, no thick yellow or green discharge.  No cervical motion tenderness. Skin:    General: Skin is warm and dry.     Capillary Refill: Capillary refill takes less than 2 seconds.  Neurological:     Mental Status: She is alert and oriented to person, place, and time.  Psychiatric:        Mood and Affect: Mood normal.        Behavior: Behavior normal.     (all labs ordered are listed, but only abnormal results are displayed) Labs Reviewed  URINALYSIS, W/ REFLEX TO CULTURE (INFECTION SUSPECTED) - Abnormal; Notable for the following components:      Result Value   APPearance HAZY (*)    Glucose, UA >=500 (*)    Leukocytes,Ua SMALL (*)     Bacteria, UA FEW (*)    All other components within normal limits  CBC - Abnormal; Notable for the following components:   WBC 11.3 (*)    Hemoglobin 10.7 (*)    HCT 33.2 (*)    All other components within normal limits  BASIC METABOLIC PANEL WITH GFR - Abnormal; Notable for the following components:   Potassium 3.2 (*)    Glucose, Bld 291 (*)    All other components within normal limits  WET PREP, GENITAL  URINE CULTURE  CBG MONITORING, ED  GC/CHLAMYDIA PROBE AMP ([place]) NOT AT [facility]    EKG: None  Radiology: No results found.   Procedures   Medications Ordered in the ED  potassium chloride  SA (KLOR-CON  M) CR tablet 40 mEq (has no administration in time range)                                    Medical Decision Making Amount and/or Complexity of Data Reviewed Labs: ordered.   This patient is a 54 y.o. female  who presents to the ED for concern of dysuria, vaginal odor.   Differential diagnoses prior to evaluation: The emergent differential diagnosis includes, but is not limited to, cystitis, pyelonephritis, yeast infection, trichomonas, bacterial vaginosis, versus other. This is not an exhaustive differential.   Past Medical History / Co-morbidities / Social History: schizophrenia, bipolar disorder, obesity, diabetes, tobacco use  Additional history: Chart reviewed. Pertinent results include: Reviewed lab work, imaging from recent previous emergency room visits  Physical Exam: Physical exam performed. The pertinent findings include: Overall normal appearance of vaginal vault with normal-appearing typical whitish discharge.  No yeast appearance, no thick yellow or green discharge.  No cervical motion tenderness.  No focal tenderness to palpation of the lower abdomen   Vital signs stable other than some hypertension, with blood pressure 128/90.  Lab Tests/Imaging studies: I personally interpreted labs/imaging and the pertinent results include: CBC with mild  leukocytosis, blood cells 11.3, mild anemia, hemoglobin 10.7, not significant change from baseline.  BMP notable for hyperglycemia, glucose 291, hypokalemia potassium 3.2, we will orally replete.  UA is somewhat equivocal with small leukocytes, 6-10 white blood cells and few bacteria, there are a fair amount of squamous cells that may be contaminant but given patient is having symptomatic dysuria we will plan to treat, send urine culture.  Wet prep is unremarkable. I agree with the radiologist interpretation.  Medications: I ordered medication including potassium for mild hypokalemia, will plan to discharge with cefadroxil  for UTI and follow urine culture.  I have reviewed the patients home medicines and have  made adjustments as needed.   Disposition: After consideration of the diagnostic results and the patients response to treatment, I feel that patient stable for discharge with plan as above.   emergency department workup does not suggest an emergent condition requiring admission or immediate intervention beyond what has been performed at this time. The plan is: as above. The patient is safe for discharge and has been instructed to return immediately for worsening symptoms, change in symptoms or any other concerns.   Final diagnoses:  Cystitis    ED Discharge Orders          Ordered    cefadroxil  (DURICEF) 500 MG capsule  2 times daily        05/16/24 0859               Rosan Sherlean DEL, PA-C 05/16/24 9140  "

## 2024-05-17 LAB — GC/CHLAMYDIA PROBE AMP (~~LOC~~) NOT AT ARMC
Chlamydia: NEGATIVE
Comment: NEGATIVE
Comment: NORMAL
Neisseria Gonorrhea: NEGATIVE

## 2024-05-17 LAB — URINE CULTURE: Culture: <10000 — AB
